# Patient Record
Sex: Female | Born: 1935 | Race: White | Hispanic: No | Smoking: Never smoker
Health system: Southern US, Community
[De-identification: ages and names within clinical notes are randomized; demographics above are authoritative.]

## PROBLEM LIST (undated history)

## (undated) DIAGNOSIS — D649 Anemia, unspecified: Secondary | ICD-10-CM

## (undated) DIAGNOSIS — M1990 Unspecified osteoarthritis, unspecified site: Secondary | ICD-10-CM

## (undated) DIAGNOSIS — Z923 Personal history of irradiation: Secondary | ICD-10-CM

## (undated) DIAGNOSIS — E041 Nontoxic single thyroid nodule: Secondary | ICD-10-CM

## (undated) DIAGNOSIS — I890 Lymphedema, not elsewhere classified: Secondary | ICD-10-CM

## (undated) DIAGNOSIS — I471 Supraventricular tachycardia, unspecified: Secondary | ICD-10-CM

## (undated) DIAGNOSIS — S46219A Strain of muscle, fascia and tendon of other parts of biceps, unspecified arm, initial encounter: Secondary | ICD-10-CM

## (undated) DIAGNOSIS — N3281 Overactive bladder: Secondary | ICD-10-CM

## (undated) DIAGNOSIS — D131 Benign neoplasm of stomach: Secondary | ICD-10-CM

## (undated) DIAGNOSIS — F32A Depression, unspecified: Secondary | ICD-10-CM

## (undated) DIAGNOSIS — T8859XA Other complications of anesthesia, initial encounter: Secondary | ICD-10-CM

## (undated) DIAGNOSIS — F329 Major depressive disorder, single episode, unspecified: Secondary | ICD-10-CM

## (undated) DIAGNOSIS — M545 Low back pain, unspecified: Secondary | ICD-10-CM

## (undated) DIAGNOSIS — R748 Abnormal levels of other serum enzymes: Secondary | ICD-10-CM

## (undated) DIAGNOSIS — C50919 Malignant neoplasm of unspecified site of unspecified female breast: Secondary | ICD-10-CM

## (undated) DIAGNOSIS — J479 Bronchiectasis, uncomplicated: Secondary | ICD-10-CM

## (undated) DIAGNOSIS — E119 Type 2 diabetes mellitus without complications: Secondary | ICD-10-CM

## (undated) DIAGNOSIS — C3490 Malignant neoplasm of unspecified part of unspecified bronchus or lung: Secondary | ICD-10-CM

## (undated) DIAGNOSIS — R05 Cough: Secondary | ICD-10-CM

## (undated) DIAGNOSIS — T4145XA Adverse effect of unspecified anesthetic, initial encounter: Secondary | ICD-10-CM

## (undated) DIAGNOSIS — G4700 Insomnia, unspecified: Secondary | ICD-10-CM

## (undated) DIAGNOSIS — M8580 Other specified disorders of bone density and structure, unspecified site: Secondary | ICD-10-CM

## (undated) DIAGNOSIS — K5790 Diverticulosis of intestine, part unspecified, without perforation or abscess without bleeding: Secondary | ICD-10-CM

## (undated) DIAGNOSIS — M810 Age-related osteoporosis without current pathological fracture: Secondary | ICD-10-CM

## (undated) DIAGNOSIS — I00 Rheumatic fever without heart involvement: Secondary | ICD-10-CM

## (undated) DIAGNOSIS — E785 Hyperlipidemia, unspecified: Secondary | ICD-10-CM

## (undated) DIAGNOSIS — H409 Unspecified glaucoma: Secondary | ICD-10-CM

## (undated) DIAGNOSIS — K219 Gastro-esophageal reflux disease without esophagitis: Secondary | ICD-10-CM

## (undated) DIAGNOSIS — F419 Anxiety disorder, unspecified: Secondary | ICD-10-CM

## (undated) DIAGNOSIS — K449 Diaphragmatic hernia without obstruction or gangrene: Secondary | ICD-10-CM

## (undated) DIAGNOSIS — K7581 Nonalcoholic steatohepatitis (NASH): Secondary | ICD-10-CM

## (undated) DIAGNOSIS — J189 Pneumonia, unspecified organism: Secondary | ICD-10-CM

## (undated) HISTORY — DX: Abnormal levels of other serum enzymes: R74.8

## (undated) HISTORY — DX: Unspecified glaucoma: H40.9

## (undated) HISTORY — DX: Personal history of irradiation: Z92.3

## (undated) HISTORY — DX: Nonalcoholic steatohepatitis (NASH): K75.81

## (undated) HISTORY — DX: Strain of muscle, fascia and tendon of other parts of biceps, unspecified arm, initial encounter: S46.219A

## (undated) HISTORY — DX: Diaphragmatic hernia without obstruction or gangrene: K44.9

## (undated) HISTORY — DX: Supraventricular tachycardia: I47.1

## (undated) HISTORY — DX: Gastro-esophageal reflux disease without esophagitis: K21.9

## (undated) HISTORY — PX: COLONOSCOPY: SHX174

## (undated) HISTORY — DX: Malignant neoplasm of unspecified part of unspecified bronchus or lung: C34.90

## (undated) HISTORY — DX: Lymphedema, not elsewhere classified: I89.0

## (undated) HISTORY — DX: Nontoxic single thyroid nodule: E04.1

## (undated) HISTORY — DX: Low back pain, unspecified: M54.50

## (undated) HISTORY — DX: Anemia, unspecified: D64.9

## (undated) HISTORY — DX: Supraventricular tachycardia, unspecified: I47.10

## (undated) HISTORY — PX: TOTAL HIP ARTHROPLASTY: SHX124

## (undated) HISTORY — DX: Rheumatic fever without heart involvement: I00

## (undated) HISTORY — PX: SKIN CANCER EXCISION: SHX779

## (undated) HISTORY — DX: Depression, unspecified: F32.A

## (undated) HISTORY — DX: Hyperlipidemia, unspecified: E78.5

## (undated) HISTORY — DX: Diverticulosis of intestine, part unspecified, without perforation or abscess without bleeding: K57.90

## (undated) HISTORY — DX: Unspecified osteoarthritis, unspecified site: M19.90

## (undated) HISTORY — DX: Bronchiectasis, uncomplicated: J47.9

## (undated) HISTORY — DX: Major depressive disorder, single episode, unspecified: F32.9

## (undated) HISTORY — DX: Low back pain: M54.5

## (undated) HISTORY — DX: Anxiety disorder, unspecified: F41.9

## (undated) HISTORY — DX: Age-related osteoporosis without current pathological fracture: M81.0

## (undated) HISTORY — DX: Type 2 diabetes mellitus without complications: E11.9

## (undated) HISTORY — DX: Overactive bladder: N32.81

## (undated) HISTORY — PX: BREAST BIOPSY: SHX20

## (undated) HISTORY — DX: Cough: R05

## (undated) HISTORY — DX: Malignant neoplasm of unspecified site of unspecified female breast: C50.919

## (undated) HISTORY — DX: Benign neoplasm of stomach: D13.1

## (undated) HISTORY — DX: Other specified disorders of bone density and structure, unspecified site: M85.80

## (undated) HISTORY — DX: Pneumonia, unspecified organism: J18.9

---
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
## 1962-12-17 HISTORY — PX: APPENDECTOMY: SHX54

## 1962-12-17 HISTORY — PX: ABDOMINAL HYSTERECTOMY: SHX81

## 1963-12-18 HISTORY — PX: CHOLECYSTECTOMY: SHX55

## 1998-12-28 ENCOUNTER — Ambulatory Visit (HOSPITAL_COMMUNITY): Admission: RE | Admit: 1998-12-28 | Discharge: 1998-12-28 | Payer: Self-pay | Admitting: Obstetrics & Gynecology

## 1999-12-18 DIAGNOSIS — C50919 Malignant neoplasm of unspecified site of unspecified female breast: Secondary | ICD-10-CM

## 1999-12-18 HISTORY — PX: BREAST LUMPECTOMY: SHX2

## 1999-12-18 HISTORY — DX: Malignant neoplasm of unspecified site of unspecified female breast: C50.919

## 2000-03-25 ENCOUNTER — Other Ambulatory Visit: Admission: RE | Admit: 2000-03-25 | Discharge: 2000-03-25 | Payer: Self-pay | Admitting: Radiology

## 2000-05-07 ENCOUNTER — Encounter: Admission: RE | Admit: 2000-05-07 | Discharge: 2000-08-05 | Payer: Self-pay | Admitting: Radiation Oncology

## 2000-06-21 ENCOUNTER — Encounter: Admission: RE | Admit: 2000-06-21 | Discharge: 2000-07-02 | Payer: Self-pay | Admitting: Radiation Oncology

## 2001-03-10 ENCOUNTER — Encounter: Admission: RE | Admit: 2001-03-10 | Discharge: 2001-05-07 | Payer: Self-pay | Admitting: Oncology

## 2001-07-29 ENCOUNTER — Other Ambulatory Visit: Admission: RE | Admit: 2001-07-29 | Discharge: 2001-07-29 | Payer: Self-pay | Admitting: Internal Medicine

## 2001-08-25 ENCOUNTER — Encounter: Admission: RE | Admit: 2001-08-25 | Discharge: 2001-08-25 | Payer: Self-pay | Admitting: Oncology

## 2001-08-25 ENCOUNTER — Encounter (HOSPITAL_COMMUNITY): Admission: RE | Admit: 2001-08-25 | Discharge: 2001-09-24 | Payer: Self-pay | Admitting: Oncology

## 2001-12-17 HISTORY — PX: CATARACT EXTRACTION: SUR2

## 2001-12-25 ENCOUNTER — Other Ambulatory Visit: Admission: RE | Admit: 2001-12-25 | Discharge: 2001-12-25 | Payer: Self-pay | Admitting: Internal Medicine

## 2001-12-30 ENCOUNTER — Encounter: Payer: Self-pay | Admitting: Internal Medicine

## 2001-12-30 ENCOUNTER — Ambulatory Visit (HOSPITAL_COMMUNITY): Admission: RE | Admit: 2001-12-30 | Discharge: 2001-12-30 | Payer: Self-pay | Admitting: Internal Medicine

## 2002-01-22 ENCOUNTER — Other Ambulatory Visit: Admission: RE | Admit: 2002-01-22 | Discharge: 2002-01-22 | Payer: Self-pay | Admitting: Internal Medicine

## 2002-02-23 ENCOUNTER — Encounter (HOSPITAL_COMMUNITY): Admission: RE | Admit: 2002-02-23 | Discharge: 2002-03-25 | Payer: Self-pay | Admitting: Oncology

## 2002-02-23 ENCOUNTER — Encounter: Admission: RE | Admit: 2002-02-23 | Discharge: 2002-02-23 | Payer: Self-pay | Admitting: Oncology

## 2002-08-26 ENCOUNTER — Encounter: Admission: RE | Admit: 2002-08-26 | Discharge: 2002-08-26 | Payer: Self-pay | Admitting: Oncology

## 2002-08-26 ENCOUNTER — Encounter (HOSPITAL_COMMUNITY): Admission: RE | Admit: 2002-08-26 | Discharge: 2002-09-25 | Payer: Self-pay | Admitting: Oncology

## 2002-12-17 ENCOUNTER — Encounter (INDEPENDENT_AMBULATORY_CARE_PROVIDER_SITE_OTHER): Payer: Self-pay | Admitting: Internal Medicine

## 2002-12-17 LAB — CONVERTED CEMR LAB: Pap Smear: NORMAL

## 2002-12-31 ENCOUNTER — Ambulatory Visit (HOSPITAL_COMMUNITY): Admission: RE | Admit: 2002-12-31 | Discharge: 2002-12-31 | Payer: Self-pay | Admitting: Internal Medicine

## 2002-12-31 ENCOUNTER — Encounter: Payer: Self-pay | Admitting: Internal Medicine

## 2003-02-24 ENCOUNTER — Encounter (HOSPITAL_COMMUNITY): Admission: RE | Admit: 2003-02-24 | Discharge: 2003-03-26 | Payer: Self-pay | Admitting: Oncology

## 2003-02-24 ENCOUNTER — Encounter: Admission: RE | Admit: 2003-02-24 | Discharge: 2003-02-24 | Payer: Self-pay | Admitting: Oncology

## 2003-03-11 ENCOUNTER — Encounter (HOSPITAL_COMMUNITY): Admission: RE | Admit: 2003-03-11 | Discharge: 2003-04-10 | Payer: Self-pay | Admitting: Rheumatology

## 2003-03-11 ENCOUNTER — Encounter: Payer: Self-pay | Admitting: Rheumatology

## 2003-09-01 ENCOUNTER — Encounter (HOSPITAL_COMMUNITY): Admission: RE | Admit: 2003-09-01 | Discharge: 2003-09-16 | Payer: Self-pay | Admitting: Oncology

## 2003-09-01 ENCOUNTER — Encounter: Admission: RE | Admit: 2003-09-01 | Discharge: 2003-09-01 | Payer: Self-pay | Admitting: Oncology

## 2003-11-03 ENCOUNTER — Encounter (HOSPITAL_COMMUNITY): Admission: RE | Admit: 2003-11-03 | Discharge: 2003-12-03 | Payer: Self-pay | Admitting: Oncology

## 2003-11-03 ENCOUNTER — Encounter: Admission: RE | Admit: 2003-11-03 | Discharge: 2003-11-03 | Payer: Self-pay | Admitting: Oncology

## 2003-11-25 ENCOUNTER — Encounter: Payer: Self-pay | Admitting: Orthopedic Surgery

## 2004-02-23 ENCOUNTER — Ambulatory Visit (HOSPITAL_COMMUNITY): Admission: RE | Admit: 2004-02-23 | Discharge: 2004-02-23 | Payer: Self-pay | Admitting: Internal Medicine

## 2004-03-01 ENCOUNTER — Encounter: Admission: RE | Admit: 2004-03-01 | Discharge: 2004-03-01 | Payer: Self-pay | Admitting: Oncology

## 2004-03-01 ENCOUNTER — Encounter (HOSPITAL_COMMUNITY): Admission: RE | Admit: 2004-03-01 | Discharge: 2004-03-31 | Payer: Self-pay | Admitting: Oncology

## 2004-03-06 ENCOUNTER — Encounter: Admission: RE | Admit: 2004-03-06 | Discharge: 2004-03-06 | Payer: Self-pay | Admitting: Oncology

## 2004-05-12 ENCOUNTER — Encounter: Admission: RE | Admit: 2004-05-12 | Discharge: 2004-05-12 | Payer: Self-pay | Admitting: Family Medicine

## 2004-06-22 ENCOUNTER — Ambulatory Visit (HOSPITAL_COMMUNITY): Admission: RE | Admit: 2004-06-22 | Discharge: 2004-06-22 | Payer: Self-pay | Admitting: Family Medicine

## 2004-06-30 ENCOUNTER — Ambulatory Visit (HOSPITAL_COMMUNITY): Admission: RE | Admit: 2004-06-30 | Discharge: 2004-06-30 | Payer: Self-pay | Admitting: Internal Medicine

## 2004-07-20 ENCOUNTER — Inpatient Hospital Stay (HOSPITAL_COMMUNITY): Admission: RE | Admit: 2004-07-20 | Discharge: 2004-07-24 | Payer: Self-pay | Admitting: Orthopaedic Surgery

## 2004-07-24 ENCOUNTER — Ambulatory Visit (HOSPITAL_COMMUNITY)
Admission: RE | Admit: 2004-07-24 | Discharge: 2004-07-24 | Payer: Self-pay | Admitting: Physical Medicine & Rehabilitation

## 2004-07-24 ENCOUNTER — Inpatient Hospital Stay
Admission: RE | Admit: 2004-07-24 | Discharge: 2004-08-03 | Payer: Self-pay | Admitting: Physical Medicine & Rehabilitation

## 2004-08-30 ENCOUNTER — Encounter (HOSPITAL_COMMUNITY): Admission: RE | Admit: 2004-08-30 | Discharge: 2004-09-15 | Payer: Self-pay | Admitting: Oncology

## 2004-08-30 ENCOUNTER — Encounter: Admission: RE | Admit: 2004-08-30 | Discharge: 2004-09-15 | Payer: Self-pay | Admitting: Oncology

## 2005-02-28 ENCOUNTER — Encounter: Admission: RE | Admit: 2005-02-28 | Discharge: 2005-02-28 | Payer: Self-pay | Admitting: Oncology

## 2005-02-28 ENCOUNTER — Ambulatory Visit (HOSPITAL_COMMUNITY): Payer: Self-pay | Admitting: Oncology

## 2005-02-28 ENCOUNTER — Encounter (HOSPITAL_COMMUNITY): Admission: RE | Admit: 2005-02-28 | Discharge: 2005-03-30 | Payer: Self-pay | Admitting: Oncology

## 2005-04-03 ENCOUNTER — Ambulatory Visit (HOSPITAL_COMMUNITY): Admission: RE | Admit: 2005-04-03 | Discharge: 2005-04-03 | Payer: Self-pay | Admitting: Internal Medicine

## 2005-08-16 ENCOUNTER — Ambulatory Visit: Payer: Self-pay | Admitting: Orthopedic Surgery

## 2005-09-21 ENCOUNTER — Ambulatory Visit: Payer: Self-pay | Admitting: Family Medicine

## 2005-09-28 ENCOUNTER — Ambulatory Visit: Payer: Self-pay | Admitting: Family Medicine

## 2005-10-02 ENCOUNTER — Ambulatory Visit (HOSPITAL_COMMUNITY): Admission: RE | Admit: 2005-10-02 | Discharge: 2005-10-02 | Payer: Self-pay | Admitting: Family Medicine

## 2005-10-22 ENCOUNTER — Ambulatory Visit: Payer: Self-pay | Admitting: Family Medicine

## 2005-10-24 ENCOUNTER — Other Ambulatory Visit: Admission: RE | Admit: 2005-10-24 | Discharge: 2005-10-24 | Payer: Self-pay | Admitting: Dermatology

## 2005-10-30 ENCOUNTER — Encounter (INDEPENDENT_AMBULATORY_CARE_PROVIDER_SITE_OTHER): Payer: Self-pay | Admitting: Internal Medicine

## 2005-10-30 LAB — CONVERTED CEMR LAB: Microalb Creat Ratio: 6.6 mg/g

## 2005-10-31 ENCOUNTER — Ambulatory Visit: Payer: Self-pay | Admitting: Family Medicine

## 2005-11-21 ENCOUNTER — Ambulatory Visit: Payer: Self-pay | Admitting: Family Medicine

## 2005-11-28 ENCOUNTER — Encounter: Admission: RE | Admit: 2005-11-28 | Discharge: 2005-11-28 | Payer: Self-pay | Admitting: Oncology

## 2005-11-28 ENCOUNTER — Ambulatory Visit (HOSPITAL_COMMUNITY): Payer: Self-pay | Admitting: Oncology

## 2005-11-28 ENCOUNTER — Encounter (HOSPITAL_COMMUNITY): Admission: RE | Admit: 2005-11-28 | Discharge: 2005-11-28 | Payer: Self-pay | Admitting: Oncology

## 2005-11-29 ENCOUNTER — Encounter (INDEPENDENT_AMBULATORY_CARE_PROVIDER_SITE_OTHER): Payer: Self-pay | Admitting: Internal Medicine

## 2005-12-17 HISTORY — PX: REVISION TOTAL HIP ARTHROPLASTY: SHX766

## 2005-12-17 HISTORY — PX: CYSTOSCOPY: SUR368

## 2005-12-25 ENCOUNTER — Ambulatory Visit: Payer: Self-pay | Admitting: Internal Medicine

## 2005-12-25 ENCOUNTER — Encounter (INDEPENDENT_AMBULATORY_CARE_PROVIDER_SITE_OTHER): Payer: Self-pay | Admitting: Internal Medicine

## 2005-12-31 ENCOUNTER — Ambulatory Visit: Payer: Self-pay | Admitting: Internal Medicine

## 2006-01-01 ENCOUNTER — Encounter (INDEPENDENT_AMBULATORY_CARE_PROVIDER_SITE_OTHER): Payer: Self-pay | Admitting: Internal Medicine

## 2006-01-01 ENCOUNTER — Ambulatory Visit: Payer: Self-pay | Admitting: Internal Medicine

## 2006-01-01 ENCOUNTER — Ambulatory Visit (HOSPITAL_COMMUNITY): Admission: RE | Admit: 2006-01-01 | Discharge: 2006-01-01 | Payer: Self-pay | Admitting: Internal Medicine

## 2006-01-29 ENCOUNTER — Ambulatory Visit: Payer: Self-pay | Admitting: Family Medicine

## 2006-01-31 ENCOUNTER — Ambulatory Visit (HOSPITAL_COMMUNITY): Admission: RE | Admit: 2006-01-31 | Discharge: 2006-01-31 | Payer: Self-pay | Admitting: Family Medicine

## 2006-02-20 ENCOUNTER — Encounter (INDEPENDENT_AMBULATORY_CARE_PROVIDER_SITE_OTHER): Payer: Self-pay | Admitting: Internal Medicine

## 2006-02-27 ENCOUNTER — Ambulatory Visit: Payer: Self-pay | Admitting: Internal Medicine

## 2006-02-27 LAB — CONVERTED CEMR LAB: Hgb A1c MFr Bld: 5.6 %

## 2006-03-06 ENCOUNTER — Ambulatory Visit: Payer: Self-pay | Admitting: Orthopedic Surgery

## 2006-04-23 ENCOUNTER — Inpatient Hospital Stay (HOSPITAL_COMMUNITY): Admission: RE | Admit: 2006-04-23 | Discharge: 2006-04-27 | Payer: Self-pay | Admitting: Orthopedic Surgery

## 2006-04-23 ENCOUNTER — Encounter (INDEPENDENT_AMBULATORY_CARE_PROVIDER_SITE_OTHER): Payer: Self-pay | Admitting: *Deleted

## 2006-04-23 ENCOUNTER — Ambulatory Visit: Payer: Self-pay | Admitting: Orthopedic Surgery

## 2006-04-24 ENCOUNTER — Ambulatory Visit: Payer: Self-pay | Admitting: Oncology

## 2006-05-03 ENCOUNTER — Ambulatory Visit: Payer: Self-pay | Admitting: Orthopedic Surgery

## 2006-05-04 ENCOUNTER — Ambulatory Visit: Payer: Self-pay | Admitting: *Deleted

## 2006-05-04 ENCOUNTER — Inpatient Hospital Stay (HOSPITAL_COMMUNITY): Admission: EM | Admit: 2006-05-04 | Discharge: 2006-05-07 | Payer: Self-pay | Admitting: Emergency Medicine

## 2006-05-04 ENCOUNTER — Encounter (INDEPENDENT_AMBULATORY_CARE_PROVIDER_SITE_OTHER): Payer: Self-pay | Admitting: Internal Medicine

## 2006-05-04 ENCOUNTER — Ambulatory Visit: Payer: Self-pay | Admitting: Orthopedic Surgery

## 2006-05-05 ENCOUNTER — Encounter: Payer: Self-pay | Admitting: Orthopedic Surgery

## 2006-05-15 ENCOUNTER — Ambulatory Visit: Payer: Self-pay | Admitting: Internal Medicine

## 2006-05-20 ENCOUNTER — Ambulatory Visit: Payer: Self-pay | Admitting: Orthopedic Surgery

## 2006-05-21 ENCOUNTER — Encounter (INDEPENDENT_AMBULATORY_CARE_PROVIDER_SITE_OTHER): Payer: Self-pay | Admitting: Internal Medicine

## 2006-05-29 ENCOUNTER — Ambulatory Visit: Payer: Self-pay | Admitting: Internal Medicine

## 2006-06-17 ENCOUNTER — Ambulatory Visit: Payer: Self-pay | Admitting: Orthopedic Surgery

## 2006-07-22 ENCOUNTER — Ambulatory Visit: Payer: Self-pay | Admitting: Orthopedic Surgery

## 2006-07-23 ENCOUNTER — Encounter (HOSPITAL_COMMUNITY): Admission: RE | Admit: 2006-07-23 | Discharge: 2006-08-22 | Payer: Self-pay | Admitting: Orthopedic Surgery

## 2006-07-23 ENCOUNTER — Encounter (INDEPENDENT_AMBULATORY_CARE_PROVIDER_SITE_OTHER): Payer: Self-pay | Admitting: Internal Medicine

## 2006-08-09 ENCOUNTER — Ambulatory Visit (HOSPITAL_COMMUNITY): Payer: Self-pay | Admitting: Oncology

## 2006-08-09 ENCOUNTER — Encounter (HOSPITAL_COMMUNITY): Admission: RE | Admit: 2006-08-09 | Discharge: 2006-09-08 | Payer: Self-pay | Admitting: Oncology

## 2006-08-09 ENCOUNTER — Encounter: Admission: RE | Admit: 2006-08-09 | Discharge: 2006-08-09 | Payer: Self-pay | Admitting: Oncology

## 2006-09-17 ENCOUNTER — Encounter: Admission: RE | Admit: 2006-09-17 | Discharge: 2006-09-17 | Payer: Self-pay | Admitting: Oncology

## 2006-09-17 ENCOUNTER — Encounter (HOSPITAL_COMMUNITY): Admission: RE | Admit: 2006-09-17 | Discharge: 2006-10-17 | Payer: Self-pay | Admitting: Oncology

## 2006-09-30 ENCOUNTER — Ambulatory Visit: Payer: Self-pay | Admitting: Orthopedic Surgery

## 2006-11-27 ENCOUNTER — Ambulatory Visit (HOSPITAL_COMMUNITY): Payer: Self-pay | Admitting: Oncology

## 2006-11-27 ENCOUNTER — Encounter (HOSPITAL_COMMUNITY): Admission: RE | Admit: 2006-11-27 | Discharge: 2006-12-16 | Payer: Self-pay | Admitting: Oncology

## 2006-11-27 ENCOUNTER — Encounter (INDEPENDENT_AMBULATORY_CARE_PROVIDER_SITE_OTHER): Payer: Self-pay | Admitting: Internal Medicine

## 2006-11-27 LAB — CONVERTED CEMR LAB
ALT: 35 units/L
CO2: 27 meq/L
Creatinine, Ser: 1.4 mg/dL
Total Bilirubin: 0.5 mg/dL

## 2006-12-17 HISTORY — PX: LIVER BIOPSY: SHX301

## 2006-12-30 ENCOUNTER — Ambulatory Visit: Payer: Self-pay | Admitting: Orthopedic Surgery

## 2007-01-08 ENCOUNTER — Encounter (HOSPITAL_COMMUNITY): Admission: RE | Admit: 2007-01-08 | Discharge: 2007-02-07 | Payer: Self-pay | Admitting: Oncology

## 2007-01-08 LAB — CONVERTED CEMR LAB
BUN: 22 mg/dL
Creatinine, Ser: 1.22 mg/dL
GFR calc Af Amer: 51 mL/min
GFR calc non Af Amer: 42 mL/min

## 2007-01-14 ENCOUNTER — Ambulatory Visit (HOSPITAL_COMMUNITY): Payer: Self-pay | Admitting: Oncology

## 2007-01-28 ENCOUNTER — Encounter: Payer: Self-pay | Admitting: Internal Medicine

## 2007-01-28 DIAGNOSIS — M1990 Unspecified osteoarthritis, unspecified site: Secondary | ICD-10-CM | POA: Insufficient documentation

## 2007-01-28 DIAGNOSIS — M810 Age-related osteoporosis without current pathological fracture: Secondary | ICD-10-CM | POA: Insufficient documentation

## 2007-01-28 DIAGNOSIS — J309 Allergic rhinitis, unspecified: Secondary | ICD-10-CM | POA: Insufficient documentation

## 2007-01-28 DIAGNOSIS — M545 Low back pain, unspecified: Secondary | ICD-10-CM | POA: Insufficient documentation

## 2007-01-28 DIAGNOSIS — K219 Gastro-esophageal reflux disease without esophagitis: Secondary | ICD-10-CM | POA: Insufficient documentation

## 2007-01-28 DIAGNOSIS — D500 Iron deficiency anemia secondary to blood loss (chronic): Secondary | ICD-10-CM | POA: Insufficient documentation

## 2007-01-28 DIAGNOSIS — N318 Other neuromuscular dysfunction of bladder: Secondary | ICD-10-CM | POA: Insufficient documentation

## 2007-01-28 DIAGNOSIS — I890 Lymphedema, not elsewhere classified: Secondary | ICD-10-CM | POA: Insufficient documentation

## 2007-01-28 DIAGNOSIS — E1165 Type 2 diabetes mellitus with hyperglycemia: Secondary | ICD-10-CM | POA: Insufficient documentation

## 2007-01-28 DIAGNOSIS — C50919 Malignant neoplasm of unspecified site of unspecified female breast: Secondary | ICD-10-CM | POA: Insufficient documentation

## 2007-01-28 DIAGNOSIS — E785 Hyperlipidemia, unspecified: Secondary | ICD-10-CM | POA: Insufficient documentation

## 2007-01-29 ENCOUNTER — Ambulatory Visit: Payer: Self-pay | Admitting: Internal Medicine

## 2007-01-29 LAB — CONVERTED CEMR LAB
ALT: 17 units/L (ref 0–35)
Albumin: 4 g/dL (ref 3.5–5.2)
CO2: 25 meq/L (ref 19–32)
Cholesterol: 223 mg/dL — ABNORMAL HIGH (ref 0–200)
Glucose, Bld: 86 mg/dL (ref 70–99)
HCT: 37.1 % (ref 36.0–46.0)
Hgb A1c MFr Bld: 5.4 %
LDL Cholesterol: 133 mg/dL — ABNORMAL HIGH (ref 0–99)
Lymphocytes Relative: 18 % (ref 12–46)
Lymphs Abs: 1 10*3/uL (ref 0.7–3.3)
Neutro Abs: 3.7 10*3/uL (ref 1.7–7.7)
Neutrophils Relative %: 68 % (ref 43–77)
Platelets: 255 10*3/uL (ref 150–400)
Potassium: 4.6 meq/L (ref 3.5–5.3)
Sodium: 138 meq/L (ref 135–145)
Total Protein: 7.3 g/dL (ref 6.0–8.3)
Triglycerides: 127 mg/dL (ref <150)
WBC: 5.4 10*3/uL (ref 4.0–10.5)

## 2007-04-29 ENCOUNTER — Encounter (INDEPENDENT_AMBULATORY_CARE_PROVIDER_SITE_OTHER): Payer: Self-pay | Admitting: Internal Medicine

## 2007-05-01 ENCOUNTER — Ambulatory Visit: Payer: Self-pay | Admitting: Internal Medicine

## 2007-05-01 DIAGNOSIS — R5383 Other fatigue: Secondary | ICD-10-CM | POA: Insufficient documentation

## 2007-05-02 ENCOUNTER — Encounter (INDEPENDENT_AMBULATORY_CARE_PROVIDER_SITE_OTHER): Payer: Self-pay | Admitting: Internal Medicine

## 2007-05-05 LAB — CONVERTED CEMR LAB
Ferritin: 434 ng/mL — ABNORMAL HIGH (ref 10–291)
Iron: 91 ug/dL (ref 42–145)
Saturation Ratios: 40 % (ref 20–55)
TIBC: 229 ug/dL — ABNORMAL LOW (ref 250–470)
UIBC: 138 ug/dL
Vitamin B-12: 871 pg/mL (ref 211–911)

## 2007-05-06 ENCOUNTER — Encounter (INDEPENDENT_AMBULATORY_CARE_PROVIDER_SITE_OTHER): Payer: Self-pay | Admitting: Family Medicine

## 2007-05-06 DIAGNOSIS — R198 Other specified symptoms and signs involving the digestive system and abdomen: Secondary | ICD-10-CM | POA: Insufficient documentation

## 2007-05-06 DIAGNOSIS — R0602 Shortness of breath: Secondary | ICD-10-CM | POA: Insufficient documentation

## 2007-05-09 ENCOUNTER — Ambulatory Visit (HOSPITAL_COMMUNITY): Admission: RE | Admit: 2007-05-09 | Discharge: 2007-05-09 | Payer: Self-pay | Admitting: Family Medicine

## 2007-05-14 ENCOUNTER — Encounter (INDEPENDENT_AMBULATORY_CARE_PROVIDER_SITE_OTHER): Payer: Self-pay | Admitting: Internal Medicine

## 2007-05-14 DIAGNOSIS — E278 Other specified disorders of adrenal gland: Secondary | ICD-10-CM | POA: Insufficient documentation

## 2007-05-17 ENCOUNTER — Encounter (INDEPENDENT_AMBULATORY_CARE_PROVIDER_SITE_OTHER): Payer: Self-pay | Admitting: Internal Medicine

## 2007-05-28 ENCOUNTER — Ambulatory Visit: Payer: Self-pay | Admitting: Internal Medicine

## 2007-05-28 DIAGNOSIS — M6281 Muscle weakness (generalized): Secondary | ICD-10-CM | POA: Insufficient documentation

## 2007-06-02 LAB — CONVERTED CEMR LAB
Metaneph Total, Ur: 457 ug/24hr (ref 95–475)
Metanephrines, Ur: 88 (ref 19–140)
Norepinephrine 24 Hr Urine: 36 mcg/24hr (ref <80)
Normetanephrine, 24H Ur: 369 — ABNORMAL HIGH (ref 52–310)
VMA, 24H Ur Adult: 5.6 mg/24hr (ref 1.8–6.7)
Volume, Urine-CORTUR: 2000 mL

## 2007-06-03 ENCOUNTER — Emergency Department (HOSPITAL_COMMUNITY): Admission: EM | Admit: 2007-06-03 | Discharge: 2007-06-03 | Payer: Self-pay | Admitting: Emergency Medicine

## 2007-06-04 ENCOUNTER — Ambulatory Visit: Payer: Self-pay | Admitting: Internal Medicine

## 2007-06-09 ENCOUNTER — Ambulatory Visit (HOSPITAL_COMMUNITY): Admission: RE | Admit: 2007-06-09 | Discharge: 2007-06-09 | Payer: Self-pay | Admitting: Internal Medicine

## 2007-06-10 ENCOUNTER — Ambulatory Visit: Payer: Self-pay | Admitting: Cardiology

## 2007-06-13 ENCOUNTER — Encounter (INDEPENDENT_AMBULATORY_CARE_PROVIDER_SITE_OTHER): Payer: Self-pay | Admitting: Internal Medicine

## 2007-06-17 DIAGNOSIS — K219 Gastro-esophageal reflux disease without esophagitis: Secondary | ICD-10-CM

## 2007-06-17 HISTORY — DX: Gastro-esophageal reflux disease without esophagitis: K21.9

## 2007-06-18 ENCOUNTER — Telehealth (INDEPENDENT_AMBULATORY_CARE_PROVIDER_SITE_OTHER): Payer: Self-pay | Admitting: Internal Medicine

## 2007-06-25 ENCOUNTER — Ambulatory Visit: Payer: Self-pay | Admitting: Internal Medicine

## 2007-06-27 ENCOUNTER — Telehealth (INDEPENDENT_AMBULATORY_CARE_PROVIDER_SITE_OTHER): Payer: Self-pay | Admitting: *Deleted

## 2007-06-30 ENCOUNTER — Ambulatory Visit: Payer: Self-pay | Admitting: Internal Medicine

## 2007-06-30 ENCOUNTER — Encounter (INDEPENDENT_AMBULATORY_CARE_PROVIDER_SITE_OTHER): Payer: Self-pay | Admitting: Internal Medicine

## 2007-06-30 ENCOUNTER — Encounter: Payer: Self-pay | Admitting: Internal Medicine

## 2007-06-30 ENCOUNTER — Ambulatory Visit (HOSPITAL_COMMUNITY): Admission: RE | Admit: 2007-06-30 | Discharge: 2007-06-30 | Payer: Self-pay | Admitting: Internal Medicine

## 2007-07-02 ENCOUNTER — Encounter (HOSPITAL_COMMUNITY): Admission: RE | Admit: 2007-07-02 | Discharge: 2007-08-01 | Payer: Self-pay | Admitting: Internal Medicine

## 2007-07-06 ENCOUNTER — Ambulatory Visit: Payer: Self-pay | Admitting: Cardiology

## 2007-07-06 ENCOUNTER — Observation Stay (HOSPITAL_COMMUNITY): Admission: EM | Admit: 2007-07-06 | Discharge: 2007-07-10 | Payer: Self-pay | Admitting: Emergency Medicine

## 2007-07-10 ENCOUNTER — Telehealth (INDEPENDENT_AMBULATORY_CARE_PROVIDER_SITE_OTHER): Payer: Self-pay | Admitting: Internal Medicine

## 2007-07-10 ENCOUNTER — Encounter (INDEPENDENT_AMBULATORY_CARE_PROVIDER_SITE_OTHER): Payer: Self-pay | Admitting: Internal Medicine

## 2007-07-11 ENCOUNTER — Encounter (INDEPENDENT_AMBULATORY_CARE_PROVIDER_SITE_OTHER): Payer: Self-pay | Admitting: Internal Medicine

## 2007-07-16 ENCOUNTER — Telehealth (INDEPENDENT_AMBULATORY_CARE_PROVIDER_SITE_OTHER): Payer: Self-pay | Admitting: *Deleted

## 2007-07-31 ENCOUNTER — Ambulatory Visit: Payer: Self-pay | Admitting: Internal Medicine

## 2007-07-31 DIAGNOSIS — F418 Other specified anxiety disorders: Secondary | ICD-10-CM | POA: Insufficient documentation

## 2007-07-31 LAB — CONVERTED CEMR LAB: Hgb A1c MFr Bld: 5.7 %

## 2007-08-03 ENCOUNTER — Encounter (INDEPENDENT_AMBULATORY_CARE_PROVIDER_SITE_OTHER): Payer: Self-pay | Admitting: Internal Medicine

## 2007-08-07 ENCOUNTER — Encounter (INDEPENDENT_AMBULATORY_CARE_PROVIDER_SITE_OTHER): Payer: Self-pay | Admitting: Internal Medicine

## 2007-08-08 ENCOUNTER — Ambulatory Visit: Payer: Self-pay | Admitting: Internal Medicine

## 2007-08-19 ENCOUNTER — Ambulatory Visit: Payer: Self-pay | Admitting: Internal Medicine

## 2007-08-19 ENCOUNTER — Encounter (INDEPENDENT_AMBULATORY_CARE_PROVIDER_SITE_OTHER): Payer: Self-pay | Admitting: Internal Medicine

## 2007-08-20 ENCOUNTER — Telehealth (INDEPENDENT_AMBULATORY_CARE_PROVIDER_SITE_OTHER): Payer: Self-pay | Admitting: *Deleted

## 2007-08-27 ENCOUNTER — Ambulatory Visit: Payer: Self-pay | Admitting: Internal Medicine

## 2007-08-27 DIAGNOSIS — R002 Palpitations: Secondary | ICD-10-CM | POA: Insufficient documentation

## 2007-08-29 ENCOUNTER — Encounter (INDEPENDENT_AMBULATORY_CARE_PROVIDER_SITE_OTHER): Payer: Self-pay | Admitting: Internal Medicine

## 2007-09-01 ENCOUNTER — Telehealth (INDEPENDENT_AMBULATORY_CARE_PROVIDER_SITE_OTHER): Payer: Self-pay | Admitting: Internal Medicine

## 2007-09-02 ENCOUNTER — Telehealth (INDEPENDENT_AMBULATORY_CARE_PROVIDER_SITE_OTHER): Payer: Self-pay | Admitting: *Deleted

## 2007-09-03 ENCOUNTER — Ambulatory Visit (HOSPITAL_COMMUNITY): Admission: RE | Admit: 2007-09-03 | Discharge: 2007-09-03 | Payer: Self-pay | Admitting: Internal Medicine

## 2007-09-03 ENCOUNTER — Ambulatory Visit: Payer: Self-pay | Admitting: Internal Medicine

## 2007-09-03 DIAGNOSIS — R222 Localized swelling, mass and lump, trunk: Secondary | ICD-10-CM | POA: Insufficient documentation

## 2007-09-04 ENCOUNTER — Telehealth (INDEPENDENT_AMBULATORY_CARE_PROVIDER_SITE_OTHER): Payer: Self-pay | Admitting: *Deleted

## 2007-09-04 ENCOUNTER — Encounter (INDEPENDENT_AMBULATORY_CARE_PROVIDER_SITE_OTHER): Payer: Self-pay | Admitting: Internal Medicine

## 2007-09-05 ENCOUNTER — Ambulatory Visit (HOSPITAL_COMMUNITY): Admission: RE | Admit: 2007-09-05 | Discharge: 2007-09-05 | Payer: Self-pay | Admitting: Internal Medicine

## 2007-09-05 LAB — CONVERTED CEMR LAB: Creatinine, Ser: 1.18 mg/dL (ref 0.40–1.20)

## 2007-09-12 ENCOUNTER — Ambulatory Visit (HOSPITAL_COMMUNITY): Payer: Self-pay | Admitting: Oncology

## 2007-09-12 ENCOUNTER — Encounter (HOSPITAL_COMMUNITY): Admission: RE | Admit: 2007-09-12 | Discharge: 2007-09-16 | Payer: Self-pay | Admitting: Oncology

## 2007-09-12 ENCOUNTER — Encounter (INDEPENDENT_AMBULATORY_CARE_PROVIDER_SITE_OTHER): Payer: Self-pay | Admitting: Internal Medicine

## 2007-09-12 LAB — CONVERTED CEMR LAB
AST: 74 units/L
Albumin: 2.8 g/dL
Alkaline Phosphatase: 129 units/L
Basophils Absolute: 0 10*3/uL
Basophils Relative: 1 %
CO2: 29 meq/L
Calcium: 7.9 mg/dL
Chloride: 102 meq/L
Creatinine, Ser: 1.2 mg/dL
GFR calc non Af Amer: 43 mL/min
Glucose, Bld: 75 mg/dL
Hemoglobin: 11.6 g/dL
Lymphocytes Relative: 32 %
MCHC: 34.4 g/dL
Neutro Abs: 2.4 10*3/uL
Neutrophils Relative %: 51 %
RBC: 3.59 M/uL
RDW: 16.7 %
Total Protein: 6.2 g/dL

## 2007-09-16 ENCOUNTER — Encounter (INDEPENDENT_AMBULATORY_CARE_PROVIDER_SITE_OTHER): Payer: Self-pay | Admitting: Internal Medicine

## 2007-09-16 DIAGNOSIS — R945 Abnormal results of liver function studies: Secondary | ICD-10-CM | POA: Insufficient documentation

## 2007-09-17 ENCOUNTER — Encounter (INDEPENDENT_AMBULATORY_CARE_PROVIDER_SITE_OTHER): Payer: Self-pay | Admitting: Internal Medicine

## 2007-09-17 ENCOUNTER — Telehealth (INDEPENDENT_AMBULATORY_CARE_PROVIDER_SITE_OTHER): Payer: Self-pay | Admitting: *Deleted

## 2007-09-18 ENCOUNTER — Encounter (INDEPENDENT_AMBULATORY_CARE_PROVIDER_SITE_OTHER): Payer: Self-pay | Admitting: Internal Medicine

## 2007-09-18 ENCOUNTER — Ambulatory Visit: Payer: Self-pay | Admitting: Internal Medicine

## 2007-09-26 ENCOUNTER — Encounter (HOSPITAL_COMMUNITY): Admission: RE | Admit: 2007-09-26 | Discharge: 2007-10-26 | Payer: Self-pay | Admitting: Oncology

## 2007-10-13 ENCOUNTER — Ambulatory Visit: Payer: Self-pay | Admitting: Internal Medicine

## 2007-10-13 ENCOUNTER — Encounter (HOSPITAL_COMMUNITY): Admission: RE | Admit: 2007-10-13 | Discharge: 2007-11-12 | Payer: Self-pay | Admitting: Internal Medicine

## 2007-10-14 ENCOUNTER — Encounter (INDEPENDENT_AMBULATORY_CARE_PROVIDER_SITE_OTHER): Payer: Self-pay | Admitting: Internal Medicine

## 2007-11-06 ENCOUNTER — Encounter (HOSPITAL_COMMUNITY): Admission: RE | Admit: 2007-11-06 | Discharge: 2007-12-06 | Payer: Self-pay | Admitting: Oncology

## 2007-11-19 ENCOUNTER — Encounter (INDEPENDENT_AMBULATORY_CARE_PROVIDER_SITE_OTHER): Payer: Self-pay | Admitting: Internal Medicine

## 2007-11-19 ENCOUNTER — Ambulatory Visit (HOSPITAL_COMMUNITY): Payer: Self-pay | Admitting: Oncology

## 2007-11-26 ENCOUNTER — Ambulatory Visit (HOSPITAL_COMMUNITY): Admission: RE | Admit: 2007-11-26 | Discharge: 2007-11-26 | Payer: Self-pay | Admitting: Oncology

## 2007-12-03 ENCOUNTER — Encounter (INDEPENDENT_AMBULATORY_CARE_PROVIDER_SITE_OTHER): Payer: Self-pay | Admitting: Interventional Radiology

## 2007-12-03 ENCOUNTER — Emergency Department (HOSPITAL_COMMUNITY): Admission: EM | Admit: 2007-12-03 | Discharge: 2007-12-04 | Payer: Self-pay | Admitting: Emergency Medicine

## 2007-12-03 ENCOUNTER — Ambulatory Visit (HOSPITAL_COMMUNITY): Admission: RE | Admit: 2007-12-03 | Discharge: 2007-12-03 | Payer: Self-pay | Admitting: Oncology

## 2007-12-05 ENCOUNTER — Ambulatory Visit (HOSPITAL_COMMUNITY): Admission: RE | Admit: 2007-12-05 | Discharge: 2007-12-05 | Payer: Self-pay

## 2007-12-05 ENCOUNTER — Telehealth (INDEPENDENT_AMBULATORY_CARE_PROVIDER_SITE_OTHER): Payer: Self-pay | Admitting: *Deleted

## 2007-12-05 ENCOUNTER — Ambulatory Visit: Payer: Self-pay | Admitting: Internal Medicine

## 2007-12-05 ENCOUNTER — Emergency Department (HOSPITAL_COMMUNITY): Admission: EM | Admit: 2007-12-05 | Discharge: 2007-12-05 | Payer: Self-pay | Admitting: Emergency Medicine

## 2007-12-05 DIAGNOSIS — R1084 Generalized abdominal pain: Secondary | ICD-10-CM | POA: Insufficient documentation

## 2007-12-05 LAB — CONVERTED CEMR LAB
ALT: 42 units/L — ABNORMAL HIGH (ref 0–35)
Albumin: 2.6 g/dL — ABNORMAL LOW (ref 3.5–5.2)
Basophils Absolute: 0 10*3/uL (ref 0.0–0.1)
CO2: 28 meq/L (ref 19–32)
Calcium: 8.4 mg/dL (ref 8.4–10.5)
Chloride: 93 meq/L — ABNORMAL LOW (ref 96–112)
Lymphocytes Relative: 5 % — ABNORMAL LOW (ref 12–46)
Lymphs Abs: 0.6 10*3/uL — ABNORMAL LOW (ref 0.7–4.0)
Neutro Abs: 9.3 10*3/uL — ABNORMAL HIGH (ref 1.7–7.7)
Neutrophils Relative %: 86 % — ABNORMAL HIGH (ref 43–77)
Platelets: 249 10*3/uL (ref 150–400)
Potassium: 4.1 meq/L (ref 3.5–5.3)
RDW: 15.5 % (ref 11.5–15.5)
Sodium: 128 meq/L — ABNORMAL LOW (ref 135–145)
Total Bilirubin: 1.7 mg/dL — ABNORMAL HIGH (ref 0.3–1.2)
Total Protein: 6.2 g/dL (ref 6.0–8.3)
WBC: 10.9 10*3/uL — ABNORMAL HIGH (ref 4.0–10.5)

## 2007-12-09 ENCOUNTER — Telehealth (INDEPENDENT_AMBULATORY_CARE_PROVIDER_SITE_OTHER): Payer: Self-pay | Admitting: Internal Medicine

## 2007-12-10 ENCOUNTER — Encounter (INDEPENDENT_AMBULATORY_CARE_PROVIDER_SITE_OTHER): Payer: Self-pay | Admitting: Internal Medicine

## 2007-12-10 ENCOUNTER — Encounter (HOSPITAL_COMMUNITY): Admission: RE | Admit: 2007-12-10 | Discharge: 2007-12-17 | Payer: Self-pay | Admitting: Oncology

## 2007-12-16 ENCOUNTER — Encounter (INDEPENDENT_AMBULATORY_CARE_PROVIDER_SITE_OTHER): Payer: Self-pay | Admitting: Internal Medicine

## 2007-12-17 ENCOUNTER — Encounter (INDEPENDENT_AMBULATORY_CARE_PROVIDER_SITE_OTHER): Payer: Self-pay | Admitting: Internal Medicine

## 2007-12-22 ENCOUNTER — Ambulatory Visit: Payer: Self-pay | Admitting: Internal Medicine

## 2007-12-22 DIAGNOSIS — E8809 Other disorders of plasma-protein metabolism, not elsewhere classified: Secondary | ICD-10-CM | POA: Insufficient documentation

## 2007-12-22 DIAGNOSIS — R609 Edema, unspecified: Secondary | ICD-10-CM | POA: Insufficient documentation

## 2008-01-06 ENCOUNTER — Ambulatory Visit: Payer: Self-pay | Admitting: Internal Medicine

## 2008-01-06 ENCOUNTER — Encounter (INDEPENDENT_AMBULATORY_CARE_PROVIDER_SITE_OTHER): Payer: Self-pay | Admitting: Internal Medicine

## 2008-02-03 ENCOUNTER — Ambulatory Visit: Payer: Self-pay | Admitting: Internal Medicine

## 2008-02-12 ENCOUNTER — Ambulatory Visit: Payer: Self-pay | Admitting: Internal Medicine

## 2008-02-17 ENCOUNTER — Ambulatory Visit (HOSPITAL_COMMUNITY): Admission: RE | Admit: 2008-02-17 | Discharge: 2008-02-17 | Payer: Self-pay | Admitting: Internal Medicine

## 2008-02-17 ENCOUNTER — Encounter (INDEPENDENT_AMBULATORY_CARE_PROVIDER_SITE_OTHER): Payer: Self-pay | Admitting: Internal Medicine

## 2008-03-08 ENCOUNTER — Encounter (INDEPENDENT_AMBULATORY_CARE_PROVIDER_SITE_OTHER): Payer: Self-pay | Admitting: Internal Medicine

## 2008-03-08 ENCOUNTER — Ambulatory Visit (HOSPITAL_COMMUNITY): Payer: Self-pay | Admitting: Oncology

## 2008-03-11 ENCOUNTER — Ambulatory Visit: Payer: Self-pay | Admitting: Internal Medicine

## 2008-03-12 ENCOUNTER — Encounter (INDEPENDENT_AMBULATORY_CARE_PROVIDER_SITE_OTHER): Payer: Self-pay | Admitting: Internal Medicine

## 2008-03-14 ENCOUNTER — Encounter (INDEPENDENT_AMBULATORY_CARE_PROVIDER_SITE_OTHER): Payer: Self-pay | Admitting: Internal Medicine

## 2008-03-15 ENCOUNTER — Ambulatory Visit (HOSPITAL_COMMUNITY): Admission: RE | Admit: 2008-03-15 | Discharge: 2008-03-15 | Payer: Self-pay | Admitting: Internal Medicine

## 2008-03-19 ENCOUNTER — Ambulatory Visit: Payer: Self-pay | Admitting: Cardiology

## 2008-04-23 ENCOUNTER — Ambulatory Visit (HOSPITAL_COMMUNITY): Payer: Self-pay | Admitting: Oncology

## 2008-04-23 ENCOUNTER — Encounter (HOSPITAL_COMMUNITY): Admission: RE | Admit: 2008-04-23 | Discharge: 2008-05-23 | Payer: Self-pay | Admitting: Oncology

## 2008-05-03 ENCOUNTER — Ambulatory Visit: Payer: Self-pay | Admitting: Orthopedic Surgery

## 2008-05-03 DIAGNOSIS — M169 Osteoarthritis of hip, unspecified: Secondary | ICD-10-CM | POA: Insufficient documentation

## 2008-05-03 DIAGNOSIS — M1610 Unilateral primary osteoarthritis, unspecified hip: Secondary | ICD-10-CM | POA: Insufficient documentation

## 2008-05-19 ENCOUNTER — Telehealth: Payer: Self-pay | Admitting: Orthopedic Surgery

## 2008-05-31 ENCOUNTER — Encounter (INDEPENDENT_AMBULATORY_CARE_PROVIDER_SITE_OTHER): Payer: Self-pay | Admitting: Internal Medicine

## 2008-06-07 ENCOUNTER — Ambulatory Visit: Payer: Self-pay | Admitting: Internal Medicine

## 2008-06-07 DIAGNOSIS — E871 Hypo-osmolality and hyponatremia: Secondary | ICD-10-CM | POA: Insufficient documentation

## 2008-06-07 LAB — CONVERTED CEMR LAB: Blood Glucose, Fingerstick: 113

## 2008-06-08 ENCOUNTER — Encounter (INDEPENDENT_AMBULATORY_CARE_PROVIDER_SITE_OTHER): Payer: Self-pay | Admitting: Internal Medicine

## 2008-06-08 LAB — CONVERTED CEMR LAB
ALT: 18 units/L (ref 0–35)
AST: 25 units/L (ref 0–37)
Basophils Absolute: 0 10*3/uL (ref 0.0–0.1)
Basophils Relative: 1 % (ref 0–1)
Chloride: 104 meq/L (ref 96–112)
Creatinine, Ser: 1.08 mg/dL (ref 0.40–1.20)
Eosinophils Relative: 1 % (ref 0–5)
Hemoglobin: 11.4 g/dL — ABNORMAL LOW (ref 12.0–15.0)
MCHC: 32 g/dL (ref 30.0–36.0)
Monocytes Absolute: 0.5 10*3/uL (ref 0.1–1.0)
Neutro Abs: 3.4 10*3/uL (ref 1.7–7.7)
Platelets: 211 10*3/uL (ref 150–400)
RDW: 13.9 % (ref 11.5–15.5)
Total Bilirubin: 0.4 mg/dL (ref 0.3–1.2)
Total CHOL/HDL Ratio: 3
VLDL: 31 mg/dL (ref 0–40)

## 2008-06-28 ENCOUNTER — Encounter: Payer: Self-pay | Admitting: Orthopedic Surgery

## 2008-08-12 ENCOUNTER — Ambulatory Visit: Payer: Self-pay | Admitting: Cardiology

## 2008-08-18 ENCOUNTER — Encounter (HOSPITAL_COMMUNITY): Admission: AD | Admit: 2008-08-18 | Discharge: 2008-09-13 | Payer: Self-pay | Admitting: Oncology

## 2008-08-18 ENCOUNTER — Ambulatory Visit (HOSPITAL_COMMUNITY): Admission: RE | Admit: 2008-08-18 | Discharge: 2008-08-18 | Payer: Self-pay | Admitting: Internal Medicine

## 2008-08-18 ENCOUNTER — Encounter (INDEPENDENT_AMBULATORY_CARE_PROVIDER_SITE_OTHER): Payer: Self-pay | Admitting: Internal Medicine

## 2008-08-26 ENCOUNTER — Ambulatory Visit (HOSPITAL_COMMUNITY): Admission: RE | Admit: 2008-08-26 | Discharge: 2008-08-26 | Payer: Self-pay | Admitting: Internal Medicine

## 2008-09-06 ENCOUNTER — Ambulatory Visit: Payer: Self-pay | Admitting: Internal Medicine

## 2008-09-06 DIAGNOSIS — E041 Nontoxic single thyroid nodule: Secondary | ICD-10-CM | POA: Insufficient documentation

## 2008-09-07 ENCOUNTER — Encounter (INDEPENDENT_AMBULATORY_CARE_PROVIDER_SITE_OTHER): Payer: Self-pay | Admitting: Internal Medicine

## 2008-09-08 LAB — CONVERTED CEMR LAB
AST: 22 units/L (ref 0–37)
Albumin: 3.9 g/dL (ref 3.5–5.2)
BUN: 18 mg/dL (ref 6–23)
Calcium: 9.2 mg/dL (ref 8.4–10.5)
Chloride: 103 meq/L (ref 96–112)
Glucose, Bld: 104 mg/dL — ABNORMAL HIGH (ref 70–99)
Lymphs Abs: 1.1 10*3/uL (ref 0.7–4.0)
Monocytes Relative: 11 % (ref 3–12)
Neutro Abs: 2.8 10*3/uL (ref 1.7–7.7)
Neutrophils Relative %: 62 % (ref 43–77)
Potassium: 4.5 meq/L (ref 3.5–5.3)
RBC: 3.89 M/uL (ref 3.87–5.11)
WBC: 4.5 10*3/uL (ref 4.0–10.5)

## 2008-09-10 ENCOUNTER — Ambulatory Visit: Payer: Self-pay | Admitting: Internal Medicine

## 2008-09-10 ENCOUNTER — Encounter (INDEPENDENT_AMBULATORY_CARE_PROVIDER_SITE_OTHER): Payer: Self-pay | Admitting: Internal Medicine

## 2008-09-21 ENCOUNTER — Ambulatory Visit: Payer: Self-pay | Admitting: Internal Medicine

## 2008-10-11 ENCOUNTER — Telehealth (INDEPENDENT_AMBULATORY_CARE_PROVIDER_SITE_OTHER): Payer: Self-pay | Admitting: *Deleted

## 2008-10-22 ENCOUNTER — Encounter (HOSPITAL_COMMUNITY): Admission: RE | Admit: 2008-10-22 | Discharge: 2008-11-21 | Payer: Self-pay | Admitting: Oncology

## 2008-10-22 ENCOUNTER — Ambulatory Visit (HOSPITAL_COMMUNITY): Payer: Self-pay | Admitting: Oncology

## 2008-11-16 HISTORY — PX: BIOPSY THYROID: PRO38

## 2008-11-23 ENCOUNTER — Telehealth (INDEPENDENT_AMBULATORY_CARE_PROVIDER_SITE_OTHER): Payer: Self-pay | Admitting: *Deleted

## 2008-11-29 ENCOUNTER — Ambulatory Visit (HOSPITAL_COMMUNITY): Admission: RE | Admit: 2008-11-29 | Discharge: 2008-11-29 | Payer: Self-pay | Admitting: Internal Medicine

## 2008-12-02 ENCOUNTER — Ambulatory Visit: Payer: Self-pay | Admitting: Internal Medicine

## 2008-12-03 ENCOUNTER — Telehealth (INDEPENDENT_AMBULATORY_CARE_PROVIDER_SITE_OTHER): Payer: Self-pay | Admitting: Internal Medicine

## 2008-12-13 ENCOUNTER — Encounter (INDEPENDENT_AMBULATORY_CARE_PROVIDER_SITE_OTHER): Payer: Self-pay | Admitting: Internal Medicine

## 2008-12-13 ENCOUNTER — Ambulatory Visit (HOSPITAL_COMMUNITY): Admission: RE | Admit: 2008-12-13 | Discharge: 2008-12-13 | Payer: Self-pay | Admitting: Internal Medicine

## 2008-12-13 ENCOUNTER — Encounter (INDEPENDENT_AMBULATORY_CARE_PROVIDER_SITE_OTHER): Payer: Self-pay | Admitting: Diagnostic Radiology

## 2009-03-07 ENCOUNTER — Ambulatory Visit (HOSPITAL_COMMUNITY): Payer: Self-pay | Admitting: Oncology

## 2009-03-07 ENCOUNTER — Encounter (INDEPENDENT_AMBULATORY_CARE_PROVIDER_SITE_OTHER): Payer: Self-pay | Admitting: Internal Medicine

## 2009-03-16 ENCOUNTER — Encounter: Payer: Self-pay | Admitting: Internal Medicine

## 2009-04-14 ENCOUNTER — Ambulatory Visit: Payer: Self-pay | Admitting: Family Medicine

## 2009-04-14 DIAGNOSIS — R071 Chest pain on breathing: Secondary | ICD-10-CM | POA: Insufficient documentation

## 2009-04-22 ENCOUNTER — Encounter (HOSPITAL_COMMUNITY): Admission: RE | Admit: 2009-04-22 | Discharge: 2009-05-22 | Payer: Self-pay | Admitting: Oncology

## 2009-04-22 ENCOUNTER — Ambulatory Visit (HOSPITAL_COMMUNITY): Payer: Self-pay | Admitting: Oncology

## 2009-05-11 ENCOUNTER — Ambulatory Visit: Payer: Self-pay | Admitting: Orthopedic Surgery

## 2009-05-27 ENCOUNTER — Telehealth (INDEPENDENT_AMBULATORY_CARE_PROVIDER_SITE_OTHER): Payer: Self-pay | Admitting: *Deleted

## 2009-06-08 ENCOUNTER — Ambulatory Visit (HOSPITAL_COMMUNITY): Admission: RE | Admit: 2009-06-08 | Discharge: 2009-06-08 | Payer: Self-pay | Admitting: Internal Medicine

## 2009-08-29 ENCOUNTER — Ambulatory Visit: Payer: Self-pay | Admitting: Internal Medicine

## 2009-09-06 ENCOUNTER — Encounter (INDEPENDENT_AMBULATORY_CARE_PROVIDER_SITE_OTHER): Payer: Self-pay | Admitting: Internal Medicine

## 2009-09-09 ENCOUNTER — Encounter (INDEPENDENT_AMBULATORY_CARE_PROVIDER_SITE_OTHER): Payer: Self-pay | Admitting: *Deleted

## 2009-09-14 ENCOUNTER — Encounter: Payer: Self-pay | Admitting: Internal Medicine

## 2009-09-15 ENCOUNTER — Encounter: Payer: Self-pay | Admitting: Internal Medicine

## 2009-09-15 LAB — CONVERTED CEMR LAB: Creatinine, Ser: 1.08 mg/dL (ref 0.40–1.20)

## 2009-09-19 ENCOUNTER — Ambulatory Visit (HOSPITAL_COMMUNITY): Admission: RE | Admit: 2009-09-19 | Discharge: 2009-09-19 | Payer: Self-pay | Admitting: Internal Medicine

## 2009-09-26 ENCOUNTER — Encounter: Payer: Self-pay | Admitting: Internal Medicine

## 2009-09-29 ENCOUNTER — Encounter (INDEPENDENT_AMBULATORY_CARE_PROVIDER_SITE_OTHER): Payer: Self-pay | Admitting: *Deleted

## 2009-10-21 ENCOUNTER — Encounter (HOSPITAL_COMMUNITY): Admission: RE | Admit: 2009-10-21 | Discharge: 2009-11-20 | Payer: Self-pay | Admitting: Oncology

## 2009-10-21 ENCOUNTER — Ambulatory Visit (HOSPITAL_COMMUNITY): Payer: Self-pay | Admitting: Oncology

## 2009-10-24 ENCOUNTER — Encounter: Payer: Self-pay | Admitting: Internal Medicine

## 2009-11-17 ENCOUNTER — Ambulatory Visit (HOSPITAL_COMMUNITY): Admission: RE | Admit: 2009-11-17 | Discharge: 2009-11-17 | Payer: Self-pay | Admitting: Family Medicine

## 2009-12-07 ENCOUNTER — Encounter: Admission: RE | Admit: 2009-12-07 | Discharge: 2009-12-07 | Payer: Self-pay | Admitting: Neurosurgery

## 2009-12-19 ENCOUNTER — Encounter: Payer: Self-pay | Admitting: Cardiology

## 2010-01-05 ENCOUNTER — Encounter (INDEPENDENT_AMBULATORY_CARE_PROVIDER_SITE_OTHER): Payer: Self-pay | Admitting: *Deleted

## 2010-01-05 LAB — CONVERTED CEMR LAB
ALT: 20 units/L
Albumin: 4.1 g/dL
BUN: 19 mg/dL
Calcium: 9.6 mg/dL
Chloride: 100 meq/L
Free T4: 5 ng/dL
HDL: 61 mg/dL
Potassium: 4.3 meq/L
Sodium: 138 meq/L

## 2010-01-06 ENCOUNTER — Ambulatory Visit (HOSPITAL_COMMUNITY): Admission: RE | Admit: 2010-01-06 | Discharge: 2010-01-06 | Payer: Self-pay | Admitting: Pulmonary Disease

## 2010-01-11 ENCOUNTER — Ambulatory Visit: Payer: Self-pay | Admitting: Cardiology

## 2010-01-24 ENCOUNTER — Ambulatory Visit: Payer: Self-pay | Admitting: Thoracic Surgery

## 2010-01-30 ENCOUNTER — Ambulatory Visit (HOSPITAL_COMMUNITY): Admission: RE | Admit: 2010-01-30 | Discharge: 2010-01-30 | Payer: Self-pay | Admitting: Thoracic Surgery

## 2010-02-01 ENCOUNTER — Ambulatory Visit: Payer: Self-pay | Admitting: Thoracic Surgery

## 2010-03-06 ENCOUNTER — Ambulatory Visit (HOSPITAL_COMMUNITY): Payer: Self-pay | Admitting: Oncology

## 2010-03-20 ENCOUNTER — Encounter: Payer: Self-pay | Admitting: Internal Medicine

## 2010-03-28 ENCOUNTER — Ambulatory Visit: Payer: Self-pay | Admitting: Orthopedic Surgery

## 2010-03-28 DIAGNOSIS — G5760 Lesion of plantar nerve, unspecified lower limb: Secondary | ICD-10-CM | POA: Insufficient documentation

## 2010-03-28 DIAGNOSIS — M25819 Other specified joint disorders, unspecified shoulder: Secondary | ICD-10-CM | POA: Insufficient documentation

## 2010-03-28 DIAGNOSIS — M7580 Other shoulder lesions, unspecified shoulder: Secondary | ICD-10-CM

## 2010-04-25 ENCOUNTER — Encounter (HOSPITAL_COMMUNITY): Admission: RE | Admit: 2010-04-25 | Discharge: 2010-05-25 | Payer: Self-pay | Admitting: Oncology

## 2010-04-25 ENCOUNTER — Ambulatory Visit (HOSPITAL_COMMUNITY): Payer: Self-pay | Admitting: Oncology

## 2010-04-27 ENCOUNTER — Ambulatory Visit (HOSPITAL_COMMUNITY): Payer: Self-pay | Admitting: Oncology

## 2010-05-03 ENCOUNTER — Ambulatory Visit: Payer: Self-pay | Admitting: Orthopedic Surgery

## 2010-05-03 DIAGNOSIS — Z96649 Presence of unspecified artificial hip joint: Secondary | ICD-10-CM | POA: Insufficient documentation

## 2010-05-03 DIAGNOSIS — M25559 Pain in unspecified hip: Secondary | ICD-10-CM | POA: Insufficient documentation

## 2010-05-17 DIAGNOSIS — C3490 Malignant neoplasm of unspecified part of unspecified bronchus or lung: Secondary | ICD-10-CM

## 2010-05-17 HISTORY — DX: Malignant neoplasm of unspecified part of unspecified bronchus or lung: C34.90

## 2010-05-31 ENCOUNTER — Encounter: Admission: RE | Admit: 2010-05-31 | Discharge: 2010-05-31 | Payer: Self-pay | Admitting: Thoracic Surgery

## 2010-05-31 ENCOUNTER — Ambulatory Visit: Payer: Self-pay | Admitting: Thoracic Surgery

## 2010-06-06 ENCOUNTER — Ambulatory Visit (HOSPITAL_COMMUNITY): Admission: RE | Admit: 2010-06-06 | Discharge: 2010-06-06 | Payer: Self-pay | Admitting: Thoracic Surgery

## 2010-06-06 HISTORY — PX: LUNG BIOPSY: SHX232

## 2010-06-27 ENCOUNTER — Ambulatory Visit: Admission: RE | Admit: 2010-06-27 | Discharge: 2010-07-21 | Payer: Self-pay | Admitting: Radiation Oncology

## 2010-10-02 ENCOUNTER — Encounter (HOSPITAL_COMMUNITY)
Admission: RE | Admit: 2010-10-02 | Discharge: 2010-11-01 | Payer: Self-pay | Source: Home / Self Care | Admitting: Oncology

## 2010-10-02 ENCOUNTER — Ambulatory Visit (HOSPITAL_COMMUNITY): Payer: Self-pay | Admitting: Oncology

## 2010-10-03 ENCOUNTER — Ambulatory Visit (HOSPITAL_COMMUNITY): Admission: RE | Admit: 2010-10-03 | Discharge: 2010-10-03 | Payer: Self-pay | Admitting: Radiation Oncology

## 2010-11-02 ENCOUNTER — Encounter (HOSPITAL_COMMUNITY)
Admission: RE | Admit: 2010-11-02 | Discharge: 2010-12-02 | Payer: Self-pay | Source: Home / Self Care | Attending: Oncology | Admitting: Oncology

## 2010-12-01 ENCOUNTER — Emergency Department (HOSPITAL_COMMUNITY)
Admission: EM | Admit: 2010-12-01 | Discharge: 2010-12-01 | Payer: Self-pay | Source: Home / Self Care | Admitting: Emergency Medicine

## 2010-12-21 ENCOUNTER — Encounter: Payer: Self-pay | Admitting: Orthopedic Surgery

## 2010-12-21 ENCOUNTER — Ambulatory Visit
Admission: RE | Admit: 2010-12-21 | Discharge: 2010-12-21 | Payer: Self-pay | Source: Home / Self Care | Attending: Orthopedic Surgery | Admitting: Orthopedic Surgery

## 2010-12-21 DIAGNOSIS — S7010XA Contusion of unspecified thigh, initial encounter: Secondary | ICD-10-CM | POA: Insufficient documentation

## 2011-01-04 ENCOUNTER — Other Ambulatory Visit: Payer: Self-pay | Admitting: Radiation Oncology

## 2011-01-04 DIAGNOSIS — C3490 Malignant neoplasm of unspecified part of unspecified bronchus or lung: Secondary | ICD-10-CM

## 2011-01-07 ENCOUNTER — Encounter: Payer: Self-pay | Admitting: Thoracic Surgery

## 2011-01-07 ENCOUNTER — Encounter (HOSPITAL_COMMUNITY): Payer: Self-pay | Admitting: Oncology

## 2011-01-07 ENCOUNTER — Encounter: Payer: Self-pay | Admitting: Neurosurgery

## 2011-01-14 LAB — CONVERTED CEMR LAB
ALT: 43 units/L — ABNORMAL HIGH (ref 0–35)
AST: 44 units/L — ABNORMAL HIGH (ref 0–37)
Albumin: 3.5 g/dL (ref 3.5–5.2)
Alkaline Phosphatase: 89 units/L (ref 39–117)
BUN: 20 mg/dL (ref 6–23)
Basophils Absolute: 0 10*3/uL (ref 0.0–0.1)
Basophils Relative: 1 % (ref 0–1)
Blood Glucose, Fingerstick: 214
Eosinophils Absolute: 0.1 10*3/uL (ref 0.0–0.7)
Glucose, Bld: 140 mg/dL — ABNORMAL HIGH (ref 70–99)
HCT: 35.5 % — ABNORMAL LOW (ref 36.0–46.0)
HCV Ab: NEGATIVE
Hemoglobin: 12 g/dL
Hep B S Ab: NEGATIVE
MCHC: 32.7 g/dL (ref 30.0–36.0)
Microalb, Ur: 0.2 mg/dL (ref 0.00–1.89)
Monocytes Absolute: 0.4 10*3/uL (ref 0.2–0.7)
Monocytes Relative: 11 % (ref 3–11)
Neutro Abs: 1.6 10*3/uL — ABNORMAL LOW (ref 1.7–7.7)
Neutrophils Relative %: 46 % (ref 43–77)
Platelets: 160 10*3/uL (ref 150–400)
Potassium: 3.8 meq/L (ref 3.5–5.3)
RBC: 3.8 M/uL — ABNORMAL LOW (ref 3.87–5.11)
RDW: 14.2 % — ABNORMAL HIGH (ref 11.5–14.0)
Sed Rate: 18 mm/hr (ref 0–22)
Sodium: 137 meq/L (ref 135–145)
Total Bilirubin: 0.6 mg/dL (ref 0.3–1.2)
Total Protein: 6.5 g/dL (ref 6.0–8.3)
WBC: 3.5 10*3/uL — ABNORMAL LOW (ref 4.0–10.5)

## 2011-01-18 NOTE — Assessment & Plan Note (Signed)
Summary: YEARLY RE-CK/XRAYS/BILAT HIPS/MEDICARE,MUT OF OM/CAF   Visit Type:  Follow-up Primary Provider:  Dr. Lucia Gaskins  CC:  bilateral hip .  History of Present Illness: This is a 77 year old female had bilateral hip replacements the RIGHT one was done in [place] the LEFT one was done later by me she comes in for followup she does have some pain in the RIGHT hip flexor and complains of difficulty climbing stairs and lifting her RIGHT leg the pain radiates into the upper thigh but not to the knee and is not neurologic in character.  Xrays today.  DOS 04-23-06. Left hip replacement.  this will be a four-year followup on a LEFT total hip arthroplasty  x-rays of both hips show that they are functioning well  Tenderness over the RIGHT flexor tendon of the hip injected   Verbal consent was obtained. The hip over the greater trochanter was prepped with alcohol and ethyl chloride. depomedrol 12m/cc and sensorcaine .25% 1 cc each was injected. there were no coplications.   Current Medications (verified): 1)  Multivitamins  Tabs (Multiple Vitamin) 2)  Slo-Niacin 500 Mg Tbcr (Niacin) ..Marland Kitchen. 1 By Mouth Once Daily 3)  Ferrous Sulfate Cr 160 (50 Fe) Mg  Tbcr (Ferrous Sulfate Dried) ..Marland Kitchen. 1 By Mouth Once Daily 4)  Aspirin 81 Mg Tbec (Aspirin) ..Marland Kitchen. 1 By Mouth Once Daily 5)  Zometa 4 Mg/55mConc (Zoledronic Acid) .... Q 6 Months Iv 6)  Freestyle Glucometer Strips .... Use As Directed 7)  Omeprazole 20 Mg  Tbec (Omeprazole) ...Marland Kitchen 1 By Mouth Once Daily 8)  Atenolol 25 Mg  Tabs (Atenolol) .... 1/2 By Mouth Once Daily 9)  Fibercon 625 Mg  Tabs (Calcium Polycarbophil) ...Marland Kitchen 1 With Breakfast, 2 With Lunch and 2 With Dinner 10)  Folic Acid 1 Mg Tabs (Folic Acid) .... Take 1 Tab Daily 11)  Nabumetone 750 Mg Tabs (Nabumetone) .... As Needed For Pain  Allergies (verified): 1)  ! Ibuprofen 2)  ! * Statins 3)  ! * Tape 4)  ! * Alvera NovelPast History:  Past Medical History: Last updated:  01/06/2010 Allergic rhinitis Anemia-NOS Diabetes mellitus, type II GERD Hyperlipidemia Low back pain Osteoarthritis Osteoporosis PSVT OAB Lymphedema-left arm Rheumatic fever, hx-age 53 Pneumonia, RLL c/b sepsis Breast cancer, left--recurrent with ? mets to liver L and R biceps tendon rupture Anxiety Elevated liver enzymes--bx with steatohepatitis Right thyroid nodule  Past Surgical History: Last updated: 01/06/2010 Cataract extraction w/ lens implant-2003 Cholecystectomy-1965 Total hip replacement-r-2005 Hysterectomy-1964 Lumpectomy c/ XRT-2001 Cystoscopy-2007 Breast biopsy x 3 w/ cystectomy THA-left-2007 EGD-1/07 Colonoscopy Liver biopsy 2008  Review of Systems Neurologic:  Denies numbness and tingling. Musculoskeletal:  Complains of muscle pain.  Physical Exam  Additional Exam:  General exam normal body habitus no deformity  Cardiovascular findings are normal with good pulses  Skin is normal  Sensation is normal in both legs  She has tenderness in the RIGHT hip although the flexion is normal it is painful LEFT hip flexion normal motor exam is normal.  There is tenderness over the hip flexors.     Impression & Recommendations:  Problem # 1:  HIP PAIN (ICD-719.45)  some tendinitis noted in the RIGHT hip recommend injection  RIGHT hip injection Verbal consent was obtained. The hip over the Point of maximal tendernesr was prepped with alcohol and ethyl chloride. depomedrol 4065mc and sensorcaine .25% 1 cc each was injected. there were no coplications.  Her updated medication list for this problem includes:    Aspirin 81  Mg Tbec (Aspirin) .Marland Kitchen... 1 by mouth once daily    Nabumetone 750 Mg Tabs (Nabumetone) .Marland Kitchen... As needed for pain  Orders: Est. Patient Level III (27639) Depo- Medrol 83m (J1030) Joint Aspirate / Injection, Large ((43200  Problem # 2:  IMPINGEMENT SYNDROME (ICD-726.2) resolved  Problem # 3:  TOTAL HIP FOLLOW-UP  (ICD-V43.64) Assessment: Comment Only  AP pelvis and bilateral AP and lateral of the hips bilateral total hip arthroplasties.  The RIGHT hip has a cerclage wire from a presumed intra-articular fracture.  Both hips appear to be functioning well.  There is some verticality of the RIGHT acetabular component but does not seem to be affecting where the  Orders: Est. Patient Level III ((37944  Patient Instructions: 1)  You have received an injection of cortisone today. You may experience increased pain at the injection site. Apply ice pack to the area for 20 minutes every 2 hours and take 2 xtra strength tylenol every 8 hours. This increased pain will usually resolve in 24 hours. The injection will take effect in 3-10 days.  2)  annual f/u for THA  3)  as needed after injection

## 2011-01-18 NOTE — Assessment & Plan Note (Signed)
Summary: past due for 1 yr f/u/tg   Visit Type:  Follow-up Primary Provider:  Dr. Lucia Gaskins   History of Present Illness: 77 year old woman presents for a followup visit. She reports having a fall (lost her balance without syncope) late last year resulting in lower back and thigh pain. She had to take a course of prednisone and during this time and in the setting of discomfort, had an increase in palpitations. Otherwise she has only rare palpitations, no dizziness, no chest pain, and stable NYHA class II dyspnea on exertion.  Recent labs from 20 January include ALT 20, AST 27, BUN 19, creatinine 1.1, potassium 4.3, sodium 138, LDL 124, HDL 61, triglycerides 116, cholesterol 208, TSH 1.9.  I subsequently noted in EMR a recent CT scan of the chest ordered by Dr. Luan Pulling demonstrating a 16 x 8 mm left lower lobe opacity, increased compared to prior study, and worrisome for malignancy. She saw Dr. Luan Pulling in consultation back in October for followup of this area. She is due to see Dr. Luan Pulling for a followup visit on 15 February. She did not mention any progressive shortness of breath, cough, or hemoptysis during her visit.  Current Medications (verified): 1)  Multivitamins  Tabs (Multiple Vitamin) 2)  Slo-Niacin 500 Mg Tbcr (Niacin) .Marland Kitchen.. 1 By Mouth Once Daily 3)  Ferrous Sulfate Cr 160 (50 Fe) Mg  Tbcr (Ferrous Sulfate Dried) .Marland Kitchen.. 1 By Mouth Once Daily 4)  Aspirin 81 Mg Tbec (Aspirin) .Marland Kitchen.. 1 By Mouth Once Daily 5)  Zometa 4 Mg/49m Conc (Zoledronic Acid) .... Q 6 Months Iv 6)  Freestyle Glucometer Strips .... Use As Directed 7)  Omeprazole 20 Mg  Tbec (Omeprazole) ..Marland Kitchen. 1 By Mouth Once Daily 8)  Atenolol 25 Mg  Tabs (Atenolol) .... 1/2 By Mouth Once Daily 9)  Fibercon 625 Mg  Tabs (Calcium Polycarbophil) ..Marland Kitchen. 1 With Breakfast, 2 With Lunch and 2 With Dinner 10)  Folic Acid 1 Mg Tabs (Folic Acid) .... Take 1 Tab Daily 11)  Nabumetone 750 Mg Tabs (Nabumetone) .... As Needed For  Pain  Allergies (verified): 1)  ! Ibuprofen 2)  ! * Statins 3)  ! * Tape 4)  ! *Alvera Novel Past History:  Past Medical History: Last updated: 01/06/2010 Allergic rhinitis Anemia-NOS Diabetes mellitus, type II GERD Hyperlipidemia Low back pain Osteoarthritis Osteoporosis PSVT OAB Lymphedema-left arm Rheumatic fever, hx-age 23 Pneumonia, RLL c/b sepsis Breast cancer, left--recurrent with ? mets to liver L and R biceps tendon rupture Anxiety Elevated liver enzymes--bx with steatohepatitis Right thyroid nodule  Social History: Last updated: 01/06/2010 Married lives with husband Never Smoked Alcohol use-no Drug use-no  Clinical Review Panels:  Echocardiogram Echocardiogram REFERRING:  [facility].      CLINICAL DATA:  An 77year old woman with dyspnea.      M-mode:  Aorta 3.3, left atrium 2.2, septum 0.9, posterior wall 1.0, LV   diastole 3.7, LV systole 3.0.      1. Technically suboptimal but adequate echocardiographic study.   2. Normal left atrium, right atrium and right ventricle.   3. Normal diameter of the proximal ascending aorta; mild calcification       of the wall and annulus.   4. Mild sclerosis of the aortic valve with normal function.   5. Mild mitral valve thickening; mild annular calcification.   6. Normal tricuspid valve.   7. Pulmonic valve and proximal pulmonary artery not adequately imaged.   8. Normal internal dimension, wall thickness, regional and global  function of the left ventricle.   9. Normal IVC.   10.Comparison with prior study of May 06, 2006:  No significant       interval change.               Cristopher Estimable. Lattie Haw, MD, [facility]   Electronically Signed            RMR/MEDQ  D:  07/07/2007  T:  07/08/2007  Job:  [id number]  (07/07/2007)    Family History: Father - deceased-63 HTN, CVA, CHF Mother - deceased-93 CHF, osteoporosis Brother - deceased-78 leukemia, heart failure  Review of Systems  The patient denies  anorexia, fever, weight loss, chest pain, syncope, peripheral edema, prolonged cough, headaches, hemoptysis, abdominal pain, melena, and hematochezia.         Chronic problems with balance. She uses canes for ambulation. No recent falls. Otherwise reviewed and negative.  Vital Signs:  Patient profile:   77 year old female Weight:      148 pounds Pulse rate:   74 / minute BP sitting:   115 / 54  (right arm)  Vitals Entered By: Doretha Sou, CNA (January 11, 2010 12:44 PM)  Physical Exam  Additional Exam:  Pleasant elderly woman, in no acute distress. HEENT: Conjunctiva and lids normal, oropharynx clear. Neck: Supple, no elevated jugular venous pressure or carotid bruits. Lungs: Clear to auscultation, diminished, nonlabored. Cardiac: Regular rate and rhythm, no S3. Abdomen: Soft, nontender. Extremity: No pitting edema.   EKG  Procedure date:  01/11/2010  Findings:      Normal sinus rhythm at 66 beats per minute with sinus arrhythmia.  Impression & Recommendations:  Problem # 1:  PAROXYSMAL SUPRAVENTRICULAR TACHYCARDIA (ICD-427.0)  Relatively well controlled on low-dose beta blocker therapy. She continues on aspirin as well. There was an increase in palpitations during a course of prednisone late last year, although these have settled down significantly. Electrocardiogram is stable. No medication changes will be made at this point. Annual followup visit will be scheduled.  Her updated medication list for this problem includes:    Aspirin 81 Mg Tbec (Aspirin) .Marland Kitchen... 1 by mouth once daily    Atenolol 25 Mg Tabs (Atenolol) .Marland Kitchen... 1/2 by mouth once daily  Problem # 2:  HYPERLIPIDEMIA (ICD-272.4)  Followed by Dr. Cindie Laroche. She continues on niacin.  Her updated medication list for this problem includes:    Slo-niacin 500 Mg Tbcr (Niacin) .Marland Kitchen... 1 by mouth once daily  Patient Instructions: 1)  Your physician recommends that you schedule a follow-up appointment in: 1 year 2)   Your physician recommends that you continue on your current medications as directed. Please refer to the Current Medication list given to you today.

## 2011-01-18 NOTE — Assessment & Plan Note (Signed)
Summary: WANTS INJECTION RT SHOULDER NO RECENT XR/MEDICARE/BSF   Visit Type:  Follow-up Primary Provider:  Dr. Lucia Gaskins  CC:  right shoulder pain.  History of Present Illness: an 77 year old female with RIGHT shoulder pain. Dull pain, which is constant, worse when lying down or lifting or raising her arm came on gradually. No trauma history previous injection complains of some catching as well.  Pain scale 4-7 /10  Allergies: 1)  ! Ibuprofen 2)  ! * Statins 3)  ! * Tape 4)  ! Alvera Novel  Physical Exam  Additional Exam:   * VS reviewed and were normal   *GEN: appearance was normal   ** CDV: normal pulses temperature and no edema  * LYMPH nodes were normal   * SKIN was normal   * Neuro: normal sensation ** Psyche: AAO x 3 and mood was normal   MSK *Gait was abnormal   The shoulder is tender over the anterolateral acromion, there is no swelling, the shoulder is stable, the SubScap is 5/5, the EXT/ROT are 5/5, the SSpinatus is 4/5. The impingement sign is positive. ROM: EXT/ROT=  45            INT/ROT=    n/t         FLEXION=  120                 Impression & Recommendations:  Problem # 1:  IMPINGEMENT SYNDROME (ICD-726.2) Assessment New  2 views of the glenohumeral joint   The glenohumeral joint space is normal. The bony anatomy is without bone lesion. The acromion is a Type II  NORMAL SHOULDER   RIGHT shoulder  Verbal consent obtained/The shoulder was injected with depomedrol 29m/cc and sensorcaine .25% . There were no complications  Orders: Est. Patient Level IV ((56979 Depo- Medrol 435m(J1030) Joint Aspirate / Injection, Large (20610) Shoulder x-ray,  minimum 2 views ([phone] Patient Instructions: 1)  You have received an injection of cortisone today. You may experience increased pain at the injection site. Apply ice pack to the area for 20 minutes every 2 hours and take 2 xtra strength tylenol every 8 hours. This increased pain will usually resolve  in 24 hours. The injection will take effect in 3-10 days.  2)  Limit activity to comfort and avoid activities that increase discomfort.  Apply moist heat and/or ice to shoulder and take medication as instructed for pain relief. Please read the Shoulder Pain Handout. 3)  Please schedule a follow-up appointment as needed.

## 2011-01-18 NOTE — Letter (Signed)
Summary: History form  History form   Imported By: Ruffin Pyo 12/21/2010 15:32:09  _____________________________________________________________________  External Attachment:    Type:   Image     Comment:   External Document

## 2011-01-18 NOTE — Assessment & Plan Note (Signed)
Summary: [facility] FOL/UP/NEW INJ LT HIP/FALL 12/01/10/XR [facility]/MEDICARE+SUP...   Visit Type:  Follow-up Primary Provider:  Dr. Lucia Gaskins  CC:  left hip pain.  History of Present Illness: This is a 77 year old female had bilateral hip replacements the RIGHT one was done in [place] the LEFT one was done later by me she comes in for followup she does have some pain in the RIGHT hip flexor and complains of difficulty climbing stairs and lifting her RIGHT leg the pain radiates into the upper thigh but not to the knee and is not neurologic in character.  DOS 04-23-06. Left hip replacement.  Today she is in the office complaining of left hip pain after fall on 12/01/10, she also has left rib pain.  Xrays taken [facility] 12/01/10 of the left hip for review.  Using walker today, doing better.  Feels like her muscles are sore in her posterior and anterior thigh.  Left hip is still sore, no numbness.  She caould not lift her left leg for a week after fall.  No pain meds have been taken in a week, er gave her Norco 5 and Relafen 524m, this helped.  She has been doing exercises to strengthen her leg.    Allergies: 1)  ! Ibuprofen 2)  ! * Statins 3)  ! * Tape 4)  ! *Alvera Novel Physical Exam  Additional Exam:  examination  Patient is well-developed well-nourished female with a scoliotic spine or and placed with a walker in a flexed pelvic lumbar junction posture.  His normal pulse and effusion LEFT lower extremity without lymphadenopathy skin is intact incision over LEFT hip is normal  No neurologic deficits are seen in the LEFT lower extremity and she's awake alert and oriented x3  She is ambulatory with full weightbearing on the LEFT leg using a walker despite her flexion posture and grocery cart sign.  She has normal range of motion in the LEFT hip with tenderness in the LEFT gluteal region.  No strength deficits are noted in the hip joint prosthesis are stable to   Impression &  Recommendations:  Problem # 1:  CONTUSION OF THIGH (ICD-924.00)  [facility] and the films have been reviewed. 5 views of the hip one is the pelvis LEFT hip films show that there is no change in the position of the prosthesis no acute complicating features.  Appreciated.  The prosthesis appears to be stable  Orders: Est. Patient Level III ((56314  Problem # 2:  HIP PAIN (ICD-719.45)  Her updated medication list for this problem includes:    Aspirin 81 Mg Tbec (Aspirin) ..Marland Kitchen.. 1 by mouth once daily    Nabumetone 750 Mg Tabs (Nabumetone) ..Marland Kitchen.. As needed for pain  Patient Instructions: 1)  Continue the antiinflamatory and the walker until you are comfortable  2)  Please schedule a follow-up appointment as needed.   Orders Added: 1)  Est. Patient Level III [[97026]

## 2011-01-18 NOTE — Miscellaneous (Signed)
Summary: atenolol refill  Clinical Lists Changes  Medications: Rx of ATENOLOL 25 MG  TABS (ATENOLOL) 1/2 by mouth once daily;  #30 x 1;  Signed;  Entered by: Tye Savoy RN;  Authorized by: Beckie Salts, MD, [facility];  Method used: Electronically to [facility] [place], [place], [place], [place]  [address], Ph: [phone], Fax: [phone]    Prescriptions: ATENOLOL 25 MG  TABS (ATENOLOL) 1/2 by mouth once daily  #30 x 1   Entered by:   Tye Savoy RN   Authorized by:   Beckie Salts, MD, [facility]   Signed by:   Tye Savoy RN on 12/19/2009   Method used:   Electronically to        [facility] (retail)       [place] [address]       [place], [place]  [address]       Ph: [phone]       Fax: [phone]   RxID:   [id number]

## 2011-01-18 NOTE — Miscellaneous (Signed)
Summary: LABS CMP,LIPID,TSH,T4 09/26/2009  Clinical Lists Changes  Observations: Added new observation of CALCIUM: 9.6 mg/dL (01/05/2010 15:13) Added new observation of ALBUMIN: 4.1 g/dL (01/05/2010 15:13) Added new observation of PROTEIN, TOT: 7.1 g/dL (01/05/2010 15:13) Added new observation of SGPT (ALT): 20 units/L (01/05/2010 15:13) Added new observation of SGOT (AST): 27 units/L (01/05/2010 15:13) Added new observation of ALK PHOS: 67 units/L (01/05/2010 15:13) Added new observation of CREATININE: 1.17 mg/dL (01/05/2010 15:13) Added new observation of BUN: 19 mg/dL (01/05/2010 15:13) Added new observation of BG RANDOM: 70 mg/dL (01/05/2010 15:13) Added new observation of CO2 PLSM/SER: 25 meq/L (01/05/2010 15:13) Added new observation of CL SERUM: 100 meq/L (01/05/2010 15:13) Added new observation of K SERUM: 4.3 meq/L (01/05/2010 15:13) Added new observation of NA: 138 meq/L (01/05/2010 15:13) Added new observation of LDL: 124 mg/dL (01/05/2010 15:13) Added new observation of HDL: 61 mg/dL (01/05/2010 15:13) Added new observation of TRIGLYC TOT: 116 mg/dL (01/05/2010 15:13) Added new observation of CHOLESTEROL: 208 mg/dL (01/05/2010 15:13) Added new observation of TSH: 1.929 microintl units/mL (01/05/2010 15:13) Added new observation of T4, FREE: 5.0 ng/dL (01/05/2010 15:13)

## 2011-01-18 NOTE — Letter (Signed)
Summary: History form  History form   Imported By: Ruffin Pyo 04/03/2010 14:05:51  _____________________________________________________________________  External Attachment:    Type:   Image     Comment:   External Document

## 2011-01-18 NOTE — Letter (Signed)
Summary: [facility]   Imported By: Zeb Comfort 03/20/2010 16:17:09  _____________________________________________________________________  External Attachment:    Type:   Image     Comment:   External Document

## 2011-02-01 ENCOUNTER — Encounter (HOSPITAL_COMMUNITY): Payer: Medicare Other | Attending: Oncology

## 2011-02-01 ENCOUNTER — Other Ambulatory Visit (HOSPITAL_COMMUNITY): Payer: Medicare Other

## 2011-02-01 DIAGNOSIS — C3490 Malignant neoplasm of unspecified part of unspecified bronchus or lung: Secondary | ICD-10-CM | POA: Insufficient documentation

## 2011-02-01 DIAGNOSIS — Z853 Personal history of malignant neoplasm of breast: Secondary | ICD-10-CM | POA: Insufficient documentation

## 2011-02-01 DIAGNOSIS — Z09 Encounter for follow-up examination after completed treatment for conditions other than malignant neoplasm: Secondary | ICD-10-CM | POA: Insufficient documentation

## 2011-02-01 DIAGNOSIS — C50919 Malignant neoplasm of unspecified site of unspecified female breast: Secondary | ICD-10-CM

## 2011-02-08 ENCOUNTER — Ambulatory Visit (HOSPITAL_COMMUNITY)
Admission: RE | Admit: 2011-02-08 | Discharge: 2011-02-08 | Disposition: A | Discharge status: Home / Self Care | Payer: Medicare Other | Source: Ambulatory Visit | Attending: Radiation Oncology | Admitting: Radiation Oncology

## 2011-02-08 ENCOUNTER — Encounter (HOSPITAL_COMMUNITY): Payer: Self-pay

## 2011-02-08 DIAGNOSIS — C3490 Malignant neoplasm of unspecified part of unspecified bronchus or lung: Secondary | ICD-10-CM | POA: Insufficient documentation

## 2011-02-08 DIAGNOSIS — E049 Nontoxic goiter, unspecified: Secondary | ICD-10-CM | POA: Insufficient documentation

## 2011-02-08 DIAGNOSIS — Z923 Personal history of irradiation: Secondary | ICD-10-CM | POA: Insufficient documentation

## 2011-02-08 MED ORDER — IOHEXOL 300 MG/ML  SOLN
80.0000 mL | Freq: Once | INTRAMUSCULAR | Status: AC | PRN
  Administered 2011-02-08: 80 mL via INTRAVENOUS

## 2011-02-09 ENCOUNTER — Ambulatory Visit: Payer: Medicare Other | Attending: Radiation Oncology | Admitting: Radiation Oncology

## 2011-02-13 ENCOUNTER — Other Ambulatory Visit: Payer: Self-pay | Admitting: Radiation Oncology

## 2011-02-13 DIAGNOSIS — E041 Nontoxic single thyroid nodule: Secondary | ICD-10-CM

## 2011-02-13 DIAGNOSIS — C3490 Malignant neoplasm of unspecified part of unspecified bronchus or lung: Secondary | ICD-10-CM

## 2011-02-15 ENCOUNTER — Ambulatory Visit (HOSPITAL_COMMUNITY)
Admission: RE | Admit: 2011-02-15 | Discharge: 2011-02-15 | Disposition: A | Discharge status: Home / Self Care | Payer: Medicare Other | Source: Ambulatory Visit | Attending: Radiation Oncology | Admitting: Radiation Oncology

## 2011-02-15 DIAGNOSIS — E041 Nontoxic single thyroid nodule: Secondary | ICD-10-CM

## 2011-02-15 DIAGNOSIS — E049 Nontoxic goiter, unspecified: Secondary | ICD-10-CM | POA: Insufficient documentation

## 2011-02-15 DIAGNOSIS — C3490 Malignant neoplasm of unspecified part of unspecified bronchus or lung: Secondary | ICD-10-CM

## 2011-02-20 ENCOUNTER — Ambulatory Visit: Payer: Self-pay | Admitting: Cardiology

## 2011-02-27 LAB — COMPREHENSIVE METABOLIC PANEL
ALT: 21 U/L (ref 0–35)
AST: 34 U/L (ref 0–37)
Albumin: 3.4 g/dL — ABNORMAL LOW (ref 3.5–5.2)
CO2: 29 mEq/L (ref 19–32)
Calcium: 9.8 mg/dL (ref 8.4–10.5)
GFR calc Af Amer: >60 mL/min (ref >60)
Sodium: 134 mEq/L — ABNORMAL LOW (ref 135–145)
Total Protein: 7.1 g/dL (ref 6.0–8.3)

## 2011-02-28 LAB — CREATININE, SERUM
GFR calc Af Amer: 56 mL/min — ABNORMAL LOW (ref >60)
GFR calc non Af Amer: 46 mL/min — ABNORMAL LOW (ref >60)

## 2011-03-04 LAB — PROTIME-INR: Prothrombin Time: 12.6 seconds (ref 11.6–15.2)

## 2011-03-04 LAB — APTT: aPTT: 23 seconds — ABNORMAL LOW (ref 24–37)

## 2011-03-04 LAB — CBC
MCV: 97.9 fL (ref 78.0–100.0)
Platelets: 241 10*3/uL (ref 150–400)
WBC: 6.4 10*3/uL (ref 4.0–10.5)

## 2011-03-05 ENCOUNTER — Ambulatory Visit (HOSPITAL_COMMUNITY)
Admission: RE | Admit: 2011-03-05 | Discharge: 2011-03-05 | Disposition: A | Discharge status: Home / Self Care | Payer: Medicare Other | Source: Ambulatory Visit | Attending: Family Medicine | Admitting: Family Medicine

## 2011-03-05 ENCOUNTER — Other Ambulatory Visit (HOSPITAL_COMMUNITY): Payer: Self-pay | Admitting: Family Medicine

## 2011-03-05 ENCOUNTER — Ambulatory Visit (HOSPITAL_COMMUNITY): Payer: Medicare Other | Admitting: Oncology

## 2011-03-05 DIAGNOSIS — J189 Pneumonia, unspecified organism: Secondary | ICD-10-CM

## 2011-03-05 DIAGNOSIS — Z85118 Personal history of other malignant neoplasm of bronchus and lung: Secondary | ICD-10-CM | POA: Insufficient documentation

## 2011-03-06 LAB — COMPREHENSIVE METABOLIC PANEL
AST: 31 U/L (ref 0–37)
CO2: 28 mEq/L (ref 19–32)
Calcium: 9.4 mg/dL (ref 8.4–10.5)
Creatinine, Ser: 1.23 mg/dL — ABNORMAL HIGH (ref 0.4–1.2)
GFR calc Af Amer: 50 mL/min — ABNORMAL LOW (ref >60)
GFR calc non Af Amer: 41 mL/min — ABNORMAL LOW (ref >60)

## 2011-03-13 ENCOUNTER — Ambulatory Visit (HOSPITAL_COMMUNITY): Payer: Medicare Other | Admitting: Oncology

## 2011-03-13 ENCOUNTER — Other Ambulatory Visit (HOSPITAL_COMMUNITY): Payer: Self-pay | Admitting: Oncology

## 2011-03-13 DIAGNOSIS — C3490 Malignant neoplasm of unspecified part of unspecified bronchus or lung: Secondary | ICD-10-CM

## 2011-03-13 DIAGNOSIS — C50919 Malignant neoplasm of unspecified site of unspecified female breast: Secondary | ICD-10-CM

## 2011-03-21 LAB — COMPREHENSIVE METABOLIC PANEL
ALT: 24 U/L (ref 0–35)
AST: 29 U/L (ref 0–37)
Albumin: 3.8 g/dL (ref 3.5–5.2)
Alkaline Phosphatase: 71 U/L (ref 39–117)
Glucose, Bld: 69 mg/dL — ABNORMAL LOW (ref 70–99)
Potassium: 3.8 mEq/L (ref 3.5–5.1)
Sodium: 137 mEq/L (ref 135–145)
Total Protein: 7.6 g/dL (ref 6.0–8.3)

## 2011-03-27 LAB — COMPREHENSIVE METABOLIC PANEL
BUN: 23 mg/dL (ref 6–23)
CO2: 28 mEq/L (ref 19–32)
Calcium: 9.4 mg/dL (ref 8.4–10.5)
Creatinine, Ser: 1.1 mg/dL (ref 0.4–1.2)
GFR calc Af Amer: 57 mL/min — ABNORMAL LOW (ref >60)
GFR calc non Af Amer: 47 mL/min — ABNORMAL LOW (ref >60)
Glucose, Bld: 117 mg/dL — ABNORMAL HIGH (ref 70–99)

## 2011-03-28 ENCOUNTER — Encounter: Payer: Self-pay | Admitting: Cardiology

## 2011-03-29 ENCOUNTER — Encounter: Payer: Self-pay | Admitting: Cardiology

## 2011-03-29 ENCOUNTER — Ambulatory Visit (INDEPENDENT_AMBULATORY_CARE_PROVIDER_SITE_OTHER): Payer: Medicare Other | Admitting: Cardiology

## 2011-03-29 VITALS — BP 122/58 | HR 80 | Ht 64.0 in | Wt 146.0 lb

## 2011-03-29 DIAGNOSIS — I471 Supraventricular tachycardia: Secondary | ICD-10-CM

## 2011-03-29 DIAGNOSIS — E782 Mixed hyperlipidemia: Secondary | ICD-10-CM

## 2011-03-29 NOTE — Assessment & Plan Note (Signed)
Symptomatically stable on low-dose beta blocker therapy. ECG reviewed above. Anticipate as needed followup at this time.

## 2011-03-29 NOTE — Patient Instructions (Signed)
Your physician recommends that you schedule a follow-up appointment in: as needed Your physician recommends that you continue on your current medications as directed. Please refer to the Current Medication list given to you today.

## 2011-03-29 NOTE — Progress Notes (Signed)
Clinical Summary [name] is a 77 y.o.female seen in January 2011 for followup of PSVT, presents now passed due for followup. She has had a number of other health concerns over the last year, unfortunately has not been bothered by any significant degree of palpitations on low-dose beta blocker therapy. She has had no frank syncope or exertional chest pain.  ECG is reviewed below. [name] indicated that she would prefer to keep visits on an as-needed basis since she has been relatively stable, not unreasonable given her other frequent health care visits.   Allergies  Allergen Reactions  . Ibuprofen   . Statins     Current outpatient prescriptions:aspirin 81 MG EC tablet, Take 81 mg by mouth daily.  , Disp: , Rfl: ;  atenolol (TENORMIN) 25 MG tablet, Take 12.5 mg by mouth daily. Take 1/2 of a 25 mg tablet , Disp: , Rfl: ;  Biotin 1000 MCG tablet, Take 1,000 mcg by mouth daily.  , Disp: , Rfl: ;  ferrous sulfate dried (SLOW FE) 160 (50 FE) MG TBCR, Take 160 mg by mouth daily.  , Disp: , Rfl:  folic acid (FOLVITE) 1 MG tablet, Take 1 mg by mouth daily.  , Disp: , Rfl: ;  glucose blood test strip, 1 each as needed. Use as instructed , Disp: , Rfl: ;  Multiple Vitamin (MULTIVITAMIN) capsule, Take 1 capsule by mouth daily.  , Disp: , Rfl: ;  niacin (SLO-NIACIN) 500 MG tablet, Take 500 mg by mouth daily.  , Disp: , Rfl: ;  omeprazole (PRILOSEC) 20 MG capsule, Take 20 mg by mouth daily.  , Disp: , Rfl:  polycarbophil (FIBERCON) 625 MG tablet, Take 625 mg by mouth. Take 1 tablet at breakfast, 2 tablets at lunch and 2 tablets with dinner , Disp: , Rfl: ;  Zoledronic Acid 4 MG/100ML SOLN, Inject 4 mg into the vein every 6 (six) months.  , Disp: , Rfl: ;  DISCONTD: nabumetone (RELAFEN) 750 MG tablet, Take 750 mg by mouth as needed. For pain , Disp: , Rfl:   Past Medical History  Diagnosis Date  . Lung cancer     Left 2011 - rad x 3  . Rheumatic fever     Age 64  . Pneumonia     RLL with sepsis  . Breast  cancer     Left s/p lumpectomy and XRT - recurrent with possible METS to liver  . Biceps tendon rupture     Bilateral  . Elevated liver enzymes     Biopsy consistent with steatohepatitis  . Right thyroid nodule   . Anxiety   . Lymphedema     Left arm  . Overactive bladder   . PSVT (paroxysmal supraventricular tachycardia)   . Osteoporosis   . Osteoarthritis   . Low back pain   . Hyperlipidemia   . GERD (gastroesophageal reflux disease)   . Type 2 diabetes mellitus   . Anemia     NOS  . Allergic rhinitis     Social History [name] reports that she has never smoked. She has never used smokeless tobacco. [name] reports that she does not drink alcohol.  Review of Systems No orthopnea, PND, syncope. Otherwise reviewed and negative.  Physical Examination Filed Vitals:   03/29/11 1259  BP: 122/58  Pulse: 83  Pleasant elderly woman, in no acute distress. HEENT: Conjunctiva and lids normal, oropharynx clear. Neck: Supple, no elevated jugular venous pressure or carotid bruits. Lungs: Clear to auscultation,  diminished, nonlabored. Cardiac: Regular rate and rhythm, no S3. Abdomen: Soft, nontender. Extremity: No pitting edema.   ECG Sinus rhythm at 72 beats per minutes with nonspecific T-wave changes.   Problem List and Plan

## 2011-05-01 NOTE — Consult Note (Signed)
[name]                  ACCOUNT NO.:  [id number]   MEDICAL RECORD NO.:  [id number]          PATIENT TYPE:  INP   LOCATION:  A201                          FACILITY:  [facility]   PHYSICIAN:  Kofi A. Merlene Laughter, M.D. DATE OF BIRTH:  09-Apr-1921   DATE OF CONSULTATION:  DATE OF DISCHARGE:                                 CONSULTATION   NEUROLOGICAL CONSULTATION   HISTORY:  The patient is an 77 year old white female who presents with  about a one year history of episodic symptoms characterized by  relatively acute onset of global weakness, diaphoresis, abdominal  discomfort and sensation of tremors on the inside.  She reports that  these episodic events last several minutes.  Again, they have been  ongoing for the last year.  She reports that recently over the last few  weeks they have become more frequent and seem to become more intense.  In addition she reports the spells seem to last longer up to about 20  minutes.  She has had extensive blood testing which, per the patient,  has been unrevealing.  She was sent to see one of the local neurologist,  Dr. Brandon Melnick and had evaluation for myasthenia gravis with acetylcholine  receptor antibody testing which was unrevealing.  The patient does deny  dysarthria, dysphasia and diplopia.  She was given Prozac by Dr. Brandon Melnick  and started this medication, which apparently made her symptoms  dramatically worse.  She developed essentially a continuous global  weakness to the point of having to be in bed all the time.  She  developed abdominal discomfort, diaphoresis, sweating and shakiness on  the inside.  She again denies any true tremors involving the  extremities.  She appeared to have some mild drowsiness after the  initiation of the Prozac.  This led to the patient being taken to the  emergency room for further evaluation.  The patient indicated that she  has also seen Dr. Laural Golden for her symptoms and has had an EGD among other  tests which  have been unremarkable.  She has been placed on a proton  pump-I  with limited efficacy regarding her symptoms.  She has been on a  NSAID particularly Voltaren, but this was discontinued.  She also was on  Fosamax but this was stopped, again, without any improvement in her  symptoms.   PAST MEDICAL HISTORY:  1. She has a history of breast cancer on the left status post      lumpectomy and radiation therapy.  2. She has a history of kidney stones.  3. Degenerative joint disease.  4. External hemorrhoids.  5. Chronic lymphedema.  6. Scoliosis.  7. Dyslipidemia.  8. Diverticulosis.   PAST SURGICAL HISTORY:  She is status post bilateral hip replacements;  the left being done in May 2007.  Status post cholecystectomy and  hysterectomy.  Biopsy benign breast lesion on the right.   ADMISSION MEDICATIONS:  1. Multivitamin.  2. Calcium with vitamin D.  3. Niacin.  4. Folic acid.  5. A Proton pump inhibitor  6. Aspirin.  7. Iron.  8. Zometa IV.   ALLERGIES:  IBUPROFEN,  K-Y JELLY, TOLECTIN.   FAMILY HISTORY:  Unremarkable.   SOCIAL HISTORY:  She lives with her husband who is in his 68's.  She has  two grown children.  No history of tobacco or alcohol use.   REVIEW OF SYSTEMS:  As in history of present illness.  She has had some  modest weight loss due possibly to her ongoing problems which have been  associated with anorexia.   PHYSICAL EXAMINATION:  GENERAL APPEARANCE:  She is a thin, pleasant lady  in no acute distress.  VITAL SIGNS:  Temperature 98.2, respirations 20, blood pressure 105/67.  NECK/HEENT:  Head is normocephalic, atraumatic.  ABDOMEN:  Soft.  EXTREMITIES:  Edema involving the ankles.  MENTATION:  The patient is awake, alert. She converses well.  Speech,  language, cognition are intact.  There is no dysarthria or aphasia  noted.  CRANIAL NERVES II-XII:  Pupils are 4 mm and reactive to light.  Extraocular movements intact. The patient's muscle strength is   symmetric.  Visual fields are intact.  Tongue is midline.  Uvula is  midline.  Shoulder shrugs are normal.  MOTOR:  Shows normal tone, bulk and stength of the upper extremities.  There is no pronator drift.  In regard to the legs, there is mild right  leg weakness proximally 4/5; distally, however, leg strength is good and  leg strength is normal on the left side.  Reflexes are preserved with  plantar reflexes upgoing on the left and downgoing on the right.  Coordination shows no dysmetria.  There are no tremors, rigidity or  bradykinesia noted.   LABORATORY DATA:  Random cortisol level 18.  CPK 57.  White blood cell  count 4.0, hemoglobin 10.9, platelet count 157,000.  TSH 2.4.  Total T4  level is 2.6.  AST slightly high at 45.  ALT 33.  Total bilirubin 0.8.   CLINICAL DATA:  Brain MRI scan shows atrophy and small vessel ischemic  disease.  There are no abnormalities noted. There is no acute stroke or  hemorrhage.  Scan was done with contrast and no enhancements noted.  Carotid Doppler's fine.   IMPRESSION:  Episodic spells of global weakness, abdominal discomfort,  diaphoresis and internal tremors.  The etiology is unclear at this time  but should include seizures given the episodic nature.  However, there  appears to be a clear postural component and one wonders whether these  are orthostatic conditions including orthostatic tremor.  Additional  possibilities include functional processes akin to irritable bowel  syndrome.   RECOMMENDATIONS:  1. EGD.  2. Repeat orthostatic parameters x3.  3. Trial of low dose benzodiazepines such as Xanax.   Thanks for this consultation.      Kofi A. Merlene Laughter, M.D.  Electronically Signed     KAD/MEDQ  D:  07/08/2007  T:  07/08/2007  Job:  [id number]

## 2011-05-01 NOTE — Discharge Summary (Signed)
[name], [name]                  ACCOUNT NO.:  [id number]   MEDICAL RECORD NO.:  [id number]          PATIENT TYPE:  INP   LOCATION:  A201                          FACILITY:  [facility]   PHYSICIAN:  Bonnielee Haff, MD     DATE OF BIRTH:  1921/07/14   DATE OF ADMISSION:  07/06/2007  DATE OF DISCHARGE:  07/24/2008LH                               DISCHARGE SUMMARY   HISTORY:  Please see H&P dictated by Dr. Anselmo Pickler for details  regarding the patient's presenting illness.   DISCHARGE DIAGNOSES:  1. Episodic weakness of unclear etiology.  2. History of anemia, likely iron deficiency, stable.  3. Hypokalemia, resolved.   BRIEF HOSPITAL COURSE:  This is an 77 year old Caucasian female with  medical problems that include history of breast cancer back in 2001,  history of irregular heart rate in the past and lymphedema of the left  upper extremity, who was in her usual state of health this past Friday,  when she took Prozac as prescribed by one of the neurologist at [facility].  On Friday, she started feeling very weak.  She felt shakiness.  She felt  like she could not get up from bed.  The patient felt this way for at  least 2 days and at that point decided to come into the hospital.  According to the patient, she has been having these symptoms since the  fall of last year, but they have been getting progressively more  frequently; they used to happen once every 2 weeks and now they are  happening almost once every other day.  Her PMD, Dr. Truett Mainland, apparently  has evaluated her and no etiology has been found.  She has also been  evaluated by a neurologist at [facility] for myasthenia gravis and apparently  that also has been ruled out.  In any case, the patient presented to the  hospital here.  She underwent blood work which showed mild hypokalemia,  otherwise completely unremarkable.  Her free T4 was slightly on the  lower side, though her TSH was within the normal range.  Her cardiac  markers were  negative.  Her hypokalemia was corrected quickly.  She was  a little bit anemic, but that also remained stable with no evidence for  any active bleeding.  The patient underwent an EEG, which was normal.  She underwent MRI of the brain which showed atrophy and small vessel  disease with no stroke.  She underwent a carotid Doppler which did not  show any stenosis.  She was also seen by Dr. Merlene Laughter, who ordered a B12  and  homocysteine level and a RPR, all of which are pending at this  time.  The patient was also seen by a cardiologist, who felt that there  was no cardiac etiology for her symptoms.   It is also felt that Prozac could have resulted in acute weakness over  this weekend, but does not explain her more chronic symptomatology.  Niacin was started 20 years ago; hence, that as an etiology is also  unlikely.   The patient has been ambulating  within her room using a walker.  She has  not had any kind of pain or dizziness with her symptoms.  Her  orthostatics were checked, which were normal.  In summary, she has had  somewhat of an extensive workup here in the hospital with no clear  etiology for her symptoms.  A 24-hour urine collection for HIAA is still  pending.  She did have joint abnormalities in the joints of her hand,  especially the PIP and the DIPs; I wonder if this patient has rheumatoid  arthritis.  She could have some other rheumatological issues which could  be causing some of her symptoms.  I would recommend that if  neurologically there is no etiology for this patient's symptoms, she be  referred to a rheumatologist for further workup and evaluation.   On the day of discharge, the patient is feeling quite well.  She states  she has not had any such episodes recently in the hospital.  She denies  any pain and denies any other complaints.  She has been ambulating  within her room with no difficulties.  She feels like she is ready to go  back home today.  Objective, her  vital signs are stable.  She has been  afebrile.  Orthostatics, as mentioned, have been normal.  Really, her  examination was unremarkable and no focal neurologic deficits were  found.   ASSESSMENT AND PLAN:  As per above.   MEDICATIONS AT HOME:  She may continue:  1. Iron 325 mg daily.  2. Niacin 500 mg b.i.d.   She has been asked to discontinue Prozac.   FOLLOWUP:  Follow up with Dr. Merlene Laughter in 2-3 weeks and Dr. Truett Mainland, August  14, as set up.   DIET:  Regular diet.   PHYSICAL ACTIVITY:  Continue to use a walker at home.   We will also set up home health to assess home safety for this  individual.   CONSULTATIONS:  [facility].  2. Dr. Merlene Laughter, neurologist.   IMAGING STUDY:  All have been discussed above, except for the chest x-  ray, which does not show any acute finding with suggestion for pulmonary  hyperexpansion.   COMMENT:  Please note above is preliminary until signed.   Total Time at Discharge: 15mns      GBonnielee Haff MD  Electronically Signed     GK/MEDQ  D:  07/10/2007  T:  07/10/2007  Job:  [id number]  cc:   KTrey SailorsA. DMerlene Laughter M.D.  Fax: [phone]  CNat Christen M.D.

## 2011-05-01 NOTE — Assessment & Plan Note (Signed)
NAMEMarland Kitchen  [name]                   CHART#:  [id number]   DATE:  09/10/2008                       DOB:  1921/03/09   PRIMARY CARE PHYSICIAN:  Nat Christen, MD.   PRIMARY GASTROENTEROLOGIST:  Bridgette Habermann, MD.   CHIEF COMPLAINT:  Followup CT results.   PROBLEM LIST:  1. She has chronic gastroesophageal reflux disease.  2. Weight loss, anorexia, and early satiety, which has resolved.  She      has had good adequate weight gain.  3. She had a borderline gastric emptying study.  4. She has history of left thyroid nodule and recent thyroid      ultrasound and she is scheduled for a followup ultrasound by Dr.      Truett Mainland in 3 months.  5. She has opacity in the left lower lung.  On last CT on 08/18/2008,      question as to whether this will need followup waiting for review      of CT by Dr. Thornton Papas and recommendations.  6. Spinal stenosis.  7. Stage I infiltrating ductal carcinoma in left breast, status post      partial mastectomy, radiation, and Arimidex.  8. Steatohepatitis, status post liver biopsy.  9. Bilateral hip replacements.  10.Cholecystectomy in 1965.  11.Osteoporosis.   SUBJECTIVE:  The patient is a pleasant 77 year old Caucasian female.  She is here for followup of CT scan results all of which are stable  except for a thyroid nodule, which was followed by thyroid ultrasound on  08/26/2008.  The nodule is hyperechoic and 2.3 cm.  There is also a  mixed echogenic nodule of 1.6 x 0.8 x 0.9 cm.  She is scheduled to have  a followup of thyroid ultrasound in 3 months by Dr. Truett Mainland.  Her weight is  up 10 pounds since she was last seen 6 months ago.  Her appetite is much  better.  Her heartburn and indigestion as well controlled on omeprazole  20 mg daily.  She occasionally has constipation for which she takes  stool softeners.  She is taking 5 tablets of FiberCon per day.  She  denies any rectal bleeding or melena.  She usually has a daily soft  brown bowel movement.   CURRENT MEDICATIONS:  See updated list from 09/10/2008.   ALLERGIES:  Ibuprofen, tape, and KY jelly.   OBJECTIVE:  VITAL SIGNS:  Weight of 148 pounds, height 64 inches,  temperature 98 degrees, blood pressure 120/78, and pulse 60.  GENERAL:  She is a pleasant, alert, oriented, and cooperative, elderly  Caucasian female, in no acute distress.  HEENT:  Sclerae clear.  She does have a few hyperpigmented areas on her  sclerae.  Conjunctiva is pink.  Oropharynx pink and moist without any  lesions.  CHEST:  Heart regular rate and rhythm.  Normal S1 and S2 without  murmurs, clicks, rubs, or gallops.  ABDOMEN:  Positive bowel sounds x4.  No bruits auscultated.  Soft,  nontender, and nondistended without palpable mass or hepatosplenomegaly.  No rebound, tenderness, or guarding.  EXTREMITIES:  She has changes of chronic arthritis of both hands.  She  has swelling of the DIP bilaterally.  She has radial or ulnar deviation.   ASSESSMENT:  The patient is an 77 year old Caucasian female with chronic  gastroesophageal reflux disease,  well controlled on proton pump  inhibitor.  She also has flushing.  She also has history of anorexia,  early satiety, and weight loss, which has resolved.  She has actually  gained 10 pounds in the last 6 months.  She had some abnormalities on CT  thyroid nodule being followed up by Dr. Truett Mainland.  She has history of  nonalcoholic steatohepatitis.  Overall, she is doing very well.   PLAN:  1. We will have Dr. Thornton Papas make suggestions regarding timing or any      further followup needed on chest or abdominal CT in concerning the      left lower lobe lung lesion.  2. Follow up with Dr. Truett Mainland regarding thyroid ultrasound.  3. Continue omeprazole 20 mg daily.  4. Continue FiberCon supplement.  5. Office visit in 1 year and we will call her if we do need to repeat      CT scans.  She also has an appointment with Dr. Tressie Stalker and she      can address this with him as  well.       [name], N.P.  Electronically Signed     [name], M.D.  Electronically Signed    [name]  D:  09/10/2008  T:  09/10/2008  Job:  [id number]   cc:   Nat Christen, M.D.  Gaston Islam. Tressie Stalker, MD

## 2011-05-01 NOTE — Group Therapy Note (Signed)
NAMEANAVICTORIA, [name] NO.:  [id number]   MEDICAL RECORD NO.:  [id number]          PATIENT TYPE:  INP   LOCATION:  A201                          FACILITY:  [facility]   PHYSICIAN:  Salem Caster, DO    DATE OF BIRTH:  1921/07/23   DATE OF PROCEDURE:  07/09/2007  DATE OF DISCHARGE:                                 PROGRESS NOTE   SUBJECTIVE:  [name] is an 77 year old Caucasian female who presented  to the emergency room with a complaint of weakness.  The patient states  this weakness has been ongoing since January, getting progressive and  worse daily.  The patient states, at this point, she needed to be  evaluated in the emergency room.  She stated, overall from her head to  her feet, she feels weak.  Symptoms are aggravated by activity and  walking.  It has been associated with some palpitations also.  This is a  patient of Dr. Truett Mainland and has been seen by a neurologist in [place] who  assessed her for myasthenia gravis which was negative.  Today the  patient states that overall she is doing well.  She has been going to  the bathroom with no problems, weakness, headache, shortness of breath,  chest pain.  The patient has not had really any rigorous activity since  she has been admitted but states, overall, she is feeling well.  Today,  she is doing well.  Has no acute distress.  The patient had a neurology  consult yesterday and it is felt that these are episodic spells of  global weakness with diaphoresis and internal tremors.  Etiology is  unclear at this time but should include seizures.  An EEG was ordered  and am awaiting her EEG to be performed.   OBJECTIVE:  VITAL SIGNS:  Temperature is 97.6, pulse 81, respirations  20, blood pressure 102/51.  The patient satting 99% on room air.  CARDIOVASCULAR:  Regular rate and rhythm.  No rubs, gallops, or murmurs.  ABDOMEN:  Is soft, nontender, nondistended.  No rigidity, guarding,  positive bowel sounds.  EXTREMITIES:  No  clubbing, cyanosis, or erythema.  NEUROLOGICALLY:  She is awake, alert, and oriented times 3.  Denies any  headache.  Cranial nerves II-XII are grossly intact.   LABS:  On 07/08/07, showed a sodium of 136, potassium 4.5, chloride 105,  CO2 26, glucose 85, BUN 16, creatinine 0.89.   RECENT RADIOLOGICAL STUDIES:  Last chest x-ray showed no acute findings.  Carotid Dopplers show minimal plaque formation with no evidence of  significant stenosis.  MRI of her brain showed atrophy and small vessel  disease.  No acute stroke or hemorrhage.  No abnormal enhancement  to  suggest metastatic disease.   ASSESSMENT AND PLAN:  For her weakness, Neurology has been recently  consulted.  EEG is pending at this time.  Also, Cardiology is following  the patient.  Echo showed no significant change from previous echo in  2007.  Seems to be within normal limits echo at this time.  For her  dyspnea.  Seems to be resolving.  Her chest x-ray was negative.  Pending  neurology workup, the patient probably be discharged home in the next 24-  48 hours.      Salem Caster, DO  Electronically Signed     SM/MEDQ  D:  07/09/2007  T:  07/10/2007  Job:  [id number]

## 2011-05-01 NOTE — H&P (Signed)
[name], [name]                  ACCOUNT NO.:  [id number]   MEDICAL RECORD NO.:  [id number]          PATIENT TYPE:  AMB   LOCATION:  DAY                           FACILITY:  [facility]   PHYSICIAN:  R. Garfield Cornea, M.D. DATE OF BIRTH:  November 26, 1921   DATE OF ADMISSION:  DATE OF DISCHARGE:  LH                              HISTORY & PHYSICAL   REFERRING PHYSICIAN:  Nat Christen, M.D.   REASON FOR CONSULTATION:  Upper abdominal symptoms, potential need for  EGD.   HISTORY OF PRESENT ILLNESS:  [name] is a pleasant, 77-year-  old, Caucasian female sent over through the courtesy of Dr. Nat Christen to further evaluate a good 10 month history of rather vague  symptoms [name] describes as intermittent episodes of fatigue and  generalized body weakness in association with upper abdominal  discomfort. She states when she becomes weak, she feels that her stomach  shrivels up. She has waxing and waning nausea and really does not  vomit. She at times experiences significant early satiety becoming full  after only eating a few bites of a meal and at other times feels that  her stomach empties too quickly.  She has 1-3 bowel movements daily,  has not had any melena or rectal bleeding.  She last weighed 144 pounds  with seen here in January 2007 and now weighs 138 pounds. She has had  some intermittent heartburn symptoms over the years and previously took  Omeprazole but is no longer on any agent for her reflux symptoms.  She  has vague intermittent esophageal dysphagia. Again she has these sudden  paroxysmal episodes of fatigue where she tells me it is actually  difficult for her to ambulate and go about her business. After she rests  for a while the symptoms subsides. She denies constipation.  She has not  had any fever or chills.  Symptoms have been persistent and possibly  insidiously worsening over the past couple of months.   According to the accompanying records provided  from Dr. Deirdre Pippins office,  she has seen Dr. Rolena Infante, a neurologist, recently and she is actually  planning to be seen again tomorrow by him. A 24-hour Holter monitor has  been done.  She has had evaluation for pheochromocytoma. Her urinary  cortisol levels come back okay at 17.9. She had a CT scan done June 10, 2007 which demonstrated nonspecific findings, tiny bilateral lung  nodules, a left adrenal lung nodule, a small nonspecific lesion in the  right lobe of the liver, atherosclerotic changes of the aorta without  aneurysm, no significant mesenteric vasculature compromise.  The stomach  was noted to be fluid filled. She had sigmoid diverticula without any  evidence of any inflammatory neoplastic process. It is notable this lady  takes niacin every day and has done so for the better part of 20 years  and really has not had any trouble taking this agent. She had previously  been on Fosamax but this has been stopped without any change in her  symptoms. She previously took Voltaren which  was also stopped.  She does  take an 81 mg aspirin daily but denies any other nonsteroidal agent. She  does have occasional retroxiphoid chest pain associated with her  episodes of weakness. It is notable she was seen in consultation by Dr.  Laural Golden back in January 2007 for mildly elevated transaminases and right  upper quadrant abdominal pain.  Ultimately she underwent an EGD on  January 01, 2006 which demonstrated some gastric polyps. These appeared  to be benign and biopsies confirmed benignity. H. pylori serologies were  negative at that time. It was recommended she continue omeprazole which  she has subsequently come off of as outlined above. At that time, she  was hemoccult negative. She had a mild anemia with a hemoglobin of 11.0  and 33.9. Previously she had mildly elevated aminotransferases, the  etiology of which appears to have been not well defined however, repeat  hepatic profile in January 2007  came back completely normal through this  office.   There is no history of diabetes and [name] tells me she has checked  her blood sugars __________ related to these episodes of fatigue and she  has not had any documented hypoglycemia. Her gallbladder was removed  many, many years ago by Dr. Newt Minion. Ultrasound of her right  upper quadrant back in October 2006 demonstrated a CBD of 7-8 mm and a  nonobstructing left lower pole kidney stone.   PAST MEDICAL HISTORY:  Significant for left breast carcinoma status post  lumpectomy and radiation therapy. She has had a history of right benign  breast lesions biopsied. She is status post bilateral hip replacement  therapy with the left being done in May 2007 status post partial  hysterectomy, cholecystectomy, cholelithiasis some 30 years ago,  hypercholesterolemia, arthritis of diverticulosis, osteoarthritis. A  colonoscopy done in 2005 by Dr. Laural Golden demonstrated external hemorrhoids  and left-sided diverticula. She has chronic right shoulder pain,  scoliosis, osteoarthritis, chronic lymphedema left upper extremity.   CURRENT MEDICATIONS:  1. Multivitamin once daily.  2. Calcium 500/600 vitamin D 2 tablets daily.  3. Niacin 1 gram daily.  4. Folic acid 40 mg daily.  5. Fiber tablets daily.  6. Aspirin 81 mg daily.  7. Iron 325 mg daily.  8. Zometa IV.   ALLERGIES:  IBUPROFEN, TOLECTIN, K-Y JELLY __________, K-Y JELLY TAPE.   FAMILY HISTORY:  No history of chronic liver disease, colorectal  neoplasia or other gastrointestinal illness.  Mother died age 52 and  father died at age 62. One brother who died with leukemia previously.   SOCIAL HISTORY:  The patient has been married for 59 years.  She has two  grown, healthy, adopted children.  She is a housewife.  There is no  history of alcohol or tobacco use.   REVIEW OF SYSTEMS:  She has had some modest weight loss over the past  one and half years, vague intermittent esophageal  dysphagia, early  satiety and vague retroxiphoid epigastric pain.  She denies  constipation, diarrhea, melena or rectal bleeding. She has not had any  recent palpitations, dyspnea on exertion, fever, chills.   PHYSICAL EXAMINATION:  GENERAL:  An 77 year old lady resting  comfortably.  VITAL SIGNS:  Weight 138, height 5 foot 4, temperature 97.9, BP 11/80,  pulse 80.  SKIN:  Warm and dry.  There is no jaundice or stigmata of chronic liver  disease.  HEENT:  No scleral icterus.  Conjunctivae pink.  Oral cavity, dentition  in good state of  repair.  No lesions.  JVD is not prominent.  CHEST:  Lungs are clear to auscultation.  CARDIAC:  Regular rate and rhythm without murmur, gallop or rub.  BREAST:  Exam is deferred.  ABDOMEN:  Nondistended, positive bowel sounds, right upper quadrant  scar.  There is no bruits.  The abdomen is soft.  She does have some  retroxiphoid tenderness to localize palpations, questionable area of  fullness there but I do not appreciate any out and out mass or  hepatosplenomegaly.  EXTREMITIES:  No edema.  She has an Ace wrap on her left arm.   IMPRESSION:  [name] is a very pleasant, 77 year old lady with a rather  peculiar constellation of non-GI and GI symptoms. I spent nearly 30  minutes with her trying to better understand her complaints.   I am somewhat hard pressed to put her systemic complaints of paroxysms  of fatigue and weakness together with her GI complaints which are also  rather somewhat vague although do gather a history of vague esophageal  dysphagia and early satiety.  She was having some right upper quadrant  pain a year and a half ago for which Dr. Laural Golden saw this nice lady.  However that symptom has subsided.   Some of her upper GI tract symptoms could be construed as secondary to  dumping syndrome but I doubt this is the case. Delayed gastric emptying  or some new intramucosal upper GI process certainly remains in the  differential and  that possibility needs to be evaluated further. She has  a history of mildly elevated transaminases which also resolve without  being well-defined previously.   If she had just started on niacin and/or Fosamax these would certainly  be suspect agents as producing some upper GI tract symptoms, but since  she had been on niacin for the better part of two decades without any  problems and the Fosamax has been stopped without any change in her  symptoms  I doubt there are any medication issues at play here.   RECOMMENDATIONS:  I feel it would be very reasonable to go ahead and  repeat an EGD at this time.  I have discussed my reasoning behind this  approach with [name]. The potential risks, benefits and alternatives  have been reviewed, her questions were answered and she is agreeable.  Depending on what is found at repeat EGD, the next step may be to obtain  a solid phase gastric emptying study to evaluate her for gastroparesis.   Further recommendations to follow pending endoscopic evaluation.   I would like to thank Dr. Nat Christen for allowing me to see this  very nice lady with interesting symptoms.      Bridgette Habermann, M.D.  Electronically Signed     RMR/MEDQ  D:  06/25/2007  T:  06/26/2007  Job:  [id number]   cc:   Nat Christen, M.D.

## 2011-05-01 NOTE — H&P (Signed)
[name], [name] NO.:  [id number]   MEDICAL RECORD NO.:  [id number]          PATIENT TYPE:  INP   LOCATION:  A201                          FACILITY:  [facility]   PHYSICIAN:  Anselmo Pickler, DO    DATE OF BIRTH:  1921/11/25   DATE OF ADMISSION:  07/06/2007  DATE OF DISCHARGE:  LH                              HISTORY & PHYSICAL   The patient is an 77 year old Caucasian female who presented to the  [facility] Emergency Room with a complaint of weakness.  The  patient admits that weakness has been ongoing since January, getting  progressive and worse, at its worst today.  At that point in time, she  decided to be evaluated in the emergency room.  She stated that it is  overall from her head to her feet.  It is weakness where she cannot  feel.  The symptoms are aggravated by activity and walking to the point  that she cannot even wash 1-2 dishes at the sink.  It has also been  associated with palpitations and she stated that she has to deep breath  in order for her to catch her breath.  At this point in time, she is a  patient of Dr. Nat Christen.  At this time the patient does admit to  being seen by a neurologist in [place], that tested her for a myasthenia  gravis and the results of that test were negative.  She does not recall  having an MRI done or an EEG done regarding any of these symptoms at  this point in time.  The patient is the historian.   PAST MEDICAL HISTORY:  1. Acid reflux.  2. Breast cancer.  3. Irregular heart rate.  4. Lymph edema.  5. Osteoporosis.  6. Nocturia.  7. Fatigue and weakness.   SURGICAL HISTORY:  1. Right hip replacement in 2005.  2. Left hip replacement in 2007.  3. She had a lumpectomy in 2001, with subsequent radiation therapy for      breast cancer.   SOCIAL HISTORY:  She is a nonsmoker but lives with a smoker.  Denies any  alcohol use or any drug use.  She lives at home with her husband [place]  f[place].  She also has  2 adopted children who are men.   FAMILY HISTORY:  Significant for coronary artery disease and  hypertension.   ALLERGIES:  1. IBUPROFEN which she admits after taking it for up to 10 days she      develops a rash.  2. TOLECTIN, a rash as well.  3. ANY KIND OF SURGICAL TAPE, she develops a rash.   She is currently only on a few medications which include:  1. Iron 325 mg p.o. once a day.  2. Niacin 550 b.i.d.  3. Prozac did not have the dose available at this point in time.   CONSTITUTIONAL:  Eyes are negative for any kind of eye pain or diplopia.  She does admit to having cataract surgery, so she has a history of  cataracts with lens replacement.  ENT:  Negative for ear pain, hearing  loss, or hoarseness, also negative for chronic dizziness.  CARDIOVASCULAR:  Positive for palpitations and dyspnea.  Negative for  chest pain, bradycardia, or claudication.  RESPIRATORY:  Positive for  hyperventilation.  Negative for cough or wheezing.  GASTROINTESTINAL:  Positive for appetite changes and a little bit of nausea and GERD.  GU:  Positive for nocturia.  Negative for dysuria, urgency, back pain, and  frequency.  MUSCULOSKELETAL:  Negative for arthralgias.  Positive for  arthritis.  Negative for back pain or myalgias.  SKIN:  Negative for  rashes, abrasions, or blistering.  NEUROLOGIC:  Positive for weakness.  PSYCHIATRIC:  Negative for anxiety or depression.  METABOLIC:  Negative  for excessive thirst or hot or cold intolerances.  HEMATOLOGIC:  Negative for anemia.   PHYSICAL EXAMINATION:  VITAL SIGNS:  Blood pressure 134/69, pulse rate  87, respirations 20, temperature 98.4, sating on O2 of room air.  GENERAL:  This is an 78 year old Caucasian female who is well-developed,  well-nourished, in no acute distress.  She is pleasant and appropriate  with appropriate affect.  SKIN:  Good turgor.  Good texture.  Warm.  No tattoos.  Hair is normal  distribution.  No nail pitting or stippling.   HEAD:  Normocephalic, atraumatic.  EYES:  Equal and reactive to light bilaterally with the right pupil  slightly larger than the left.  EARS:  Symmetrical.  No drainage or discharge noted.  TM's are  visualized.  NOSE:  Symmetrical and no discharge noted.  MOUTH/THROAT:  Teeth are in fair repair.  No erythema or exudate noted.  NECK:  No masses.  Full range of motion.  No tracheal deviation.  Thyroid was within normal limits.  BREASTS:  Positive lumpectomy scar on the left side.  No tenderness or  masses.  HEART:  Regular rate and rhythm.  No gallops, rubs, or clicks noted.  LUNGS:  Clear to auscultation bilaterally.  No wheezes, crackles, or  rhonchi.  ABDOMEN:  Soft, nontender, nondistended.  No rebound tenderness or  masses or guarding noted.  Spleen and liver are within normal limits.  GU:  External genitalia is within normal limits.  MUSCULOSKELETAL:  No muscular atrophy.  Good strength in upper and lower  extremities.  Positive +2 pitting edema in bilateral lower legs.  LYMPHATIC:  No cervical or axillary lymphadenopathy.  NEUROLOGIC:  Cranial nerves II-XII grossly intact.   LABORATORY:  Done in the ED include:  A CMP:  Sodium 131, potassium 3.5,  chloride 98, carbon dioxide 24, glucose 96, BUN 13, creatinine 1.1, and  she also had a CBC:  White count 6.4, hemoglobin 12, hematocrit 35.1,  platelets 210.  She had a chest x-ray done that showed no acute changes  and an EKG showed normal sinus rhythm, premature atrial contractions  with an axis that was normal, P QRS was normal, ST was normal, rate was  96 beats per minute.  She also had cardiac markers done myoglobin 79, CK-  MB 1.2, troponin less than 0.05.   At that point in time, the patient was admitted to the service of [facility] for weakness and exertional dyspnea.   ASSESSMENT:  1. Exertional dyspnea.  2. Weakness.  3. Dizziness.  4. History of arrhythmia.  5. History of breast cancer.   PLAN:  1. We will admit  the patient to the service of [facility].  2. We will have a 2D echo done and a carotid Doppler  to assess      potential blockages.  3. Due to the patient's history of breast cancer and this progressive      weakness, we will also have an MRI of the brain done and depending      on possible management, we will consult neurology if needed.  4. We will continue with DVT and GI prophylaxis.  5. We will continue to monitor the patient.      Anselmo Pickler, DO  Electronically Signed     CB/MEDQ  D:  07/06/2007  T:  07/06/2007  Job:  [id number]

## 2011-05-01 NOTE — Group Therapy Note (Signed)
[name], [name]                  ACCOUNT NO.:  [id number]   MEDICAL RECORD NO.:  [id number]          PATIENT TYPE:  INP   LOCATION:  A201                          FACILITY:  [facility]   PHYSICIAN:  Kofi A. Merlene Laughter, M.D. DATE OF BIRTH:  March 03, 1921   DATE OF PROCEDURE:  07/09/2007  DATE OF DISCHARGE:                                 PROGRESS NOTE   The patient reports that she has not had any of the internal weak spell.  She, however, has had one spell where she developed abdominal  discomfort.  This happened after taking the 500 mg Niacin tablet.  It  resolved after drinking lots of fluids.  She continues to have muscle  weakness, however.  She has been up ambulating and going to the  bathroom, however.  Her orthostatic's lying 104/53, pulse 81; standing  123/63, pulse 95; repeat lying down blood pressure 96/54, pulse 76;  standing 126/64, pulse 90; the latest blood pressure 123/63, pulse 82  lying; standing blood pressure 120/73, pulse 106.  The patient is able  to ambulate and has somewhat of a wide, antalgic gait and requires  holding on.  EEG was done and reviewed and essentially is normal.   ASSESSMENT:  Episodic symptoms of unclear etiology likely worsened with  Prozac.  Will do additional blood testing for B12 deficiency, and also  an RPR.  She is to follow up in our clinic in the next two to three  weeks.      Kofi A. Merlene Laughter, M.D.  Electronically Signed     KAD/MEDQ  D:  07/09/2007  T:  07/09/2007  Job:  [id number]

## 2011-05-01 NOTE — Procedures (Signed)
[name], [name]                  ACCOUNT NO.:  [id number]   MEDICAL RECORD NO.:  [id number]          PATIENT TYPE:  INP   LOCATION:  A201                          FACILITY:  [facility]   PHYSICIAN:  Kofi A. Merlene Laughter, M.D. DATE OF BIRTH:  06-14-1921   DATE OF PROCEDURE:  07/09/2007  DATE OF DISCHARGE:                              EEG INTERPRETATION   This is an 77 year old lady who has episodic spells suspicious for  nonconvulsive seizure activity.   MEDICATIONS:  Prozac, niacin, aspirin, Benadryl.   ANALYSIS:  This is a 16-channel recording that was conducted for  approximately 22 minutes.  There is a well-formed posterior rhythm of 9  Hz which attenuates with eye opening.  Beta activity is noted in the  frontal areas.  Awake activity is recording no sleep activity as  observed.  Photic stimulation is conducted without changes in the  background activity.  There is no focal slowing or lateralized slowing  or epileptiform activity observed.   IMPRESSION:  Normal recording of the awake state, however, a single  recording does not rule out epileptic seizures.  If clinically  indicated, a sleep deprived recording could be useful.      Kofi A. Merlene Laughter, M.D.  Electronically Signed     KAD/MEDQ  D:  07/09/2007  T:  07/09/2007  Job:  [id number]

## 2011-05-01 NOTE — Letter (Signed)
January 24, 2010   Keota Luan Pulling, MD  [address]  [place], [place] [address]   Re:  [name], KEARL                  DOB:  12/27/1920   Dear [name],   I appreciate the opportunity of seeing the patient.  This patient had a  previous left breast cancer, which was treated with excision and  radiation.  There is a question about a year or 2 years ago when she had  a PET scan for liver lesions and a liver biopsy, which turned out to be  negative.  She now comes with an enlarging left lower lobe lesion that  is increased in size since October from 7 x 12 mm that is now up to 16 x  8 mm.  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  She is a nonsmoker but has been exposed to secondhand smoke.  She has had no fever, chills, excessive sputum.  No hemoptysis.   MEDICATIONS:  Atenolol 12.5 daily, niacin 500 mg a day, aspirin,  omeprazole 20 mg daily, iron 50 mg daily and Zometa that she has been on  twice.   ALLERGIES:  She is allergic to tape, causes skin irritation.   She had no fever, chills or excessive sputum.  She has hypertension and  hypercholesterolemia.   FAMILY HISTORY:  Noncontributory.   SOCIAL HISTORY:  She is married, 2 adopted children.  She worked as  income Counselling psychologist.  She does not smoke, does not drink alcohol on a  regular basis.  Her husband is a smoker.   REVIEW OF SYSTEMS:  CONSTITUTIONAL:  She is 5 feet, 448 pounds.  GENERAL:  Weight has been stable.  CARDIAC:  She has had some atrial arrhythmias.  No angina.  PULMONARY:  No hemoptysis.  GI:  Hiatal hernia.  GU:  Frequent urination.  VASCULAR:  She has had no claudication, DVT or TIAs but has left arm and  lymphedema secondary to her surgery.  NEUROLOGICAL:  No dizziness  headaches, blackouts, seizures.  MUSCULOSKELETAL:  She has had both hips replaced and walks with 2 canes.  PSYCHIATRIC:  No depression and nervousness.  EYE/ENT:  No change in her eyesight or hearing.  HEMATOLOGIC:  No problems with bleeding, clotting disorders or anemia.   PHYSICAL EXAMINATION:  General:  She is a frail looking 77 year old  Caucasian female in no acute distress.  Her blood pressure is 132/68,  pulse 65, respirations 18, sats were 96%.  Head, Eyes, Ears, Nose and  Throat:  Unremarkable.  Neck:  Supple without thyromegaly.  There is no  supraclavicular or axillary adenopathy.  Chest:  Clear to auscultation  and percussion.  Heart:  Regular sinus rhythm.  No murmurs.  Abdomen:  Soft.  There is no hepatosplenomegaly.  Extremities:  Pulses 2+.  There  is no clubbing or edema.  Neurologic:  She is alert, oriented x3.  Extremities:  She has got bilateral hip replacements.  She said walks  with a cane.   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  Plan to get a PET scan, I want to just  to see what the standard uptake value is, it is probably going to be  negative, but I want to be sure that it is negative on PET scan, and  then we have to consider doing a needle biopsy  or waiting 3-4 months  until we have electromagnetic navigation bronchoscopy available.  I will  see her back again after PET scan and let you know what my thoughts are.    Sincerely,   Nicanor Alcon, M.D.  Electronically Signed   DPB/MEDQ  D:  01/24/2010  T:  01/25/2010  Job:  [id number]   cc:   Unk Lightning, MD

## 2011-05-01 NOTE — Assessment & Plan Note (Signed)
NAMEJAWANNA, [name]                   CHART#:  [id number]   DATE:  01/06/2008                       DOB:  10-Feb-1921   I received a call from Dr. Tressie Stalker last month.  He asked me to see [name] back in consultation, reference opinion on liver lesion and weight  loss.  [name] is an 77 year old lady with a history of breast cancer,  who has sustained significant weight loss over the past several months.  She is having some early satiety and didn't like any food, particularly  her own cooking.   Workup on this end included an EGD which revealed a small hiatal hernia  and multiple fundal gland polyps (proven to be benign with biopsy).  Saw  the patient's gastric emptying study demonstrated borderline prolonged  gastric emptying with 23% of gastric contents remaining in 2 hours.  We  started her on some Reglan at a very low dose, 2.5 mg a.c. for 1 month.  This was associated with transient improvement and early satiety, and  anorexia.  She previously had been taking Fosamax and Voltaren, but  these agents have been stopped.  Colonoscopy done by Dr. Hans Eden back in  2005 demonstrated only left-sided diverticula.   More recently, she underwent CT scan orchestrated by Dr. Tressie Stalker to  further evaluate mildly elevated liver enzymes, weight loss, and given  her prior history of carcinoma.  CT demonstrated some indeterminate  liver lesions.  She ultimately underwent an ultrasound-guided needle  biopsy, left hepatic lobe mass, which revealed steatosis and some mild  atypia, but no evidence of a carcinoma.  Her constitutional symptoms of  weight loss, anorexia continued.  MRI was performed on December 15, 2007.  This demonstrated post biopsy changes, left hepatic lobe lesion  in the area of the falciform ligament.   I discussed and reviewed the recent imaging studies with Dr. Earle Gell  on 2 different occasions over the holidays and it was felt that taken in  totality that there do not appear  to be any concerning liver lesions on  CT.  However, it was recommended a 3 month interval follow up liver CT  be performed.   [name] tells me she now is feeling overall much better.  She has been  treated for depression by Dr. Truett Mainland and seemed much better.  She now is  able to eat her own food and eats much more than she has had previously.  She has not had any melena or hematochezia.  Early satiety is better.  No nausea or vomiting.  She is taking omeprazole 20 mg orally daily.  She weighs in at 130 pounds today, but did weight 140 pounds back on  August 19, 2007 through our office.  She feels that overall she is  doing very well and has no GI complaints today.   PAST MEDICAL HISTORY:  Notable for breast cancer, status post lumpectomy  and radiation therapy previously.  History of benign breast mass biopsy.  History of bilateral hip replacements, partial hysterectomy,  cholecystectomy.  History of osteoarthritis.  Colonoscopy in 2007  demonstrated diverticulosis.  She has lymphedema, left upper extremity.  Osteoporosis.   CURRENT MEDICATIONS:  1. Multivitamin daily.  2. Calcium 500/600 vitamin D daily.  3. Niacin 500 mg 2 tablets daily.  4. Fiber tablets, 5 daily.  5. ASA 81 mg daily.  6. Iron 325 mg daily.  7. Zometa IV q.6 months.  8. Omeprazole 20 mg daily.  9. Atenolol 1/2 of a 12.5 mg tablet daily.   FAMILY HISTORY:  No history of chronic GI or liver illness.  One brother  had leukemia.   SOCIAL HISTORY:  The patient has been married for 60 years.  Two grown,  healthy adopted children.  She is a housewife.  No alcohol or tobacco.   REVIEW OF SYSTEMS:  Weight loss as noted above.  Has not had any chest  pain or dyspnea on exertion.  No fever or chills.  Otherwise as in  history of present illness.   PHYSICAL EXAMINATION:  Reveals a frail, 77 year old lady resting  comfortably.  Weight 130.  Height 5 feet 4 inches.  Temp 98.  BP 120/70.  Pulse of 72.  SKIN:  Warm  and dry.  There is no jaundice or continued stigmata of  chronic liver disease.  CHEST/LUNGS:  Clear to auscultation.  CORE:  Regular rate and rhythm, without murmur, gallop, rub.  ABDOMEN:  Nondistended.  Positive bowel sounds.  Soft, nontender,  without appreciable mass or organomegaly.  EXTREMITIES:  No lower extremity edema.   IMPRESSION:  [name] is a very pleasant 77 year old lady with a  protracted episode of anorexia, early satiety, weight loss, and  borderline delayed gastric emptying on a recent GES study.  Was found  likely to have a possible hepatic mass on CT, who has undergone a fairly  extensive evaluation.   After reviewing the multiple studies and lengthy discussion with Dr.  Kris Hartmann, it appears that the findings on CT and MRI are not suggestive of  a mass, but more like technical variations in scanning, technical  variations in technique, and possibly some focal heterogeneous changes  related to fatty infiltration.  Findings of the recent biopsy are quite  reassuring and the fact that [name] appears to be clinically turning  around is most reassuring to me as well.   RECOMMENDATIONS:  1. At this point I have told [name] that she is just to carry on and      continue to eat and focus on multiple smaller meals daily than any      1 large meal.  She is to continue her omeprazole.  2. Will go ahead and check an hepatic profile now to see how the      numbers look.  3. By all means, go with Dr. Huel Coventry recommendations of repeating a CT      of the liver at 3 months, which will be mid-March of this year.      Will plan to see this lady back shortly after her followup CT and      p.r.n.   I would like to thank Dr. Tressie Stalker for allowing me to see this nice  lady once again.       Bridgette Habermann, M.D.  Electronically Signed     RMR/MEDQ  D:  01/07/2008  T:  01/07/2008  Job:  [id number]

## 2011-05-01 NOTE — Assessment & Plan Note (Signed)
NAMEMarland Kitchen  [name], [name]                   CHART#:  [id number]   DATE:  08/19/2007                       DOB:  Aug 05, 1921   PROBLEM:  Followup of nausea, fullness and early satiety.   SUBJECTIVE:  [name] was last seen by me at the hospital on June 30, 2007, at which time she underwent an EGD to further evaluate the vague  upper GI symptoms.  She was found to have a normal esophagus, small  hiatal hernia and multiple laryngeal gland-type polyps (biopsy proven to  be benign).  We did a solid-phase gastric emptying study on her which  was borderline-prolonged with 23% of the gastric contents remaining in  the stomach at 2 hours.  She was started on low-dose Reglan 2.5 mg a.c.  for 1 month; this was associated essentially with near resolution in her  symptoms; she has been off that regimen for a good 3-4 weeks now and  continues to do very well.  She has gotten into a habit of eating 4-5  smaller meals daily rather than 3 main meals and this has served her  very well and the vague upper abdominal symptoms she was having have  settled down.  She is not taking omeprazole, Ditropan or Fosamax at this  time.  In addition, she has also stopped Voltaren.  She continues to  take an iron supplement and aspirin 81 mg daily.  She feels she is doing  overall very well.  It is notable that Dr. Laural Golden did a colonoscopy on  this nice lady back in 2005 and found some hemorrhoids and some left-  sided diverticula only.   CURRENT MEDICATIONS:  See updated list.   ALLERGIES:  IBUPROFEN, K-Y JELLY, TAPE.   PHYSICAL EXAMINATION:  GENERAL:  On exam today, looks well.  VITAL SIGNS:  Weight 140, up 2 pounds, height 5 feet 4 inches.  Temperature 97.8, blood pressure 120/72, pulse 80.  ABDOMEN:  Flat.  Positive bowel sounds.  No succussion splash.  Abdomen  is soft and nontender, without appreciable mass or organomegaly.   ASSESSMENT:  She has some delay in gastric emptying, currently  asymptomatic.  Her  nonspecific gastrointestinal symptoms with which she  presented in July have settled down.  At this point, I see no reason  necessarily to get her back on Reglan, which is associated with  significant side-effects.  I told her she might have a low threshold for  getting back on omeprazole should she develop any upper abdominal  fullness or reflux symptoms, but right now she could make that an as-  needed medication.  Unless something comes up, I will leave followup  here open-ended.  I told her that Dr. Truett Mainland could let me know should she  need to be  seen here again in the future and I also told [name] she could  certainly call me directly should any issues come up.       Bridgette Habermann, M.D.  Electronically Signed     RMR/MEDQ  D:  08/19/2007  T:  08/20/2007  Job:  [id number]   cc:   Nat Christen, M.D.

## 2011-05-01 NOTE — Consult Note (Signed)
[name], [name]                  ACCOUNT NO.:  [id number]   MEDICAL RECORD NO.:  [id number]          PATIENT TYPE:  INP   LOCATION:  A201                          FACILITY:  [facility]   PHYSICIAN:  Cristopher Estimable. Lattie Haw, MD, FACCDATE OF BIRTH:  1921/06/04   DATE OF CONSULTATION:  07/07/2007  DATE OF DISCHARGE:                                 CONSULTATION   REFERRING PHYSICIAN:  Dr. Delphina Cahill.   PRIMARY CARE PHYSICIAN:  Dr. Truett Mainland.   PRIMARY CARDIOLOGIST:  Previously Dr. Verl Blalock.   HISTORY OF PRESENT ILLNESS:  An 77 year old woman admitted to the  hospital with profound weakness.  [name] has no significant past  cardiac history.  She has been evaluated by Dr. Verl Blalock in the past for  weakness.  She cannot recall the details of this assessment, but she was  not found to have any significant cardiac issues.  She describes  approximately one year history of episodic weakness.  This is not  related to exertion or change in body position.  She experiences hours  or days when she feels lassitude and inability to function properly.  This was infrequent in the past but has become progressively more  frequent.  Her symptoms were so severe, and her workup was so prolonged  that she elected to come to the emergency department and was referred  for admission.   She has been extensively evaluated by Dr. Truett Mainland, Dr. Tressie Stalker - her  oncologist, and a neurologist in [place].  Apparently, no significant  etiology for her symptoms have been found.  A trial of Prozac was  recently begun.  The patient experienced some dizziness with initial  dosing but has not noted improvement after a few days of that  medication.   She has had hypoglycemia in the past and likens these episodes to those;  however, she has used a blood glucose meter at home and documented  normal CBGs during symptoms.  She has had a Holter monitor that was  unrevealing.  An echocardiogram was essentially normal.  She has had a  CT of the abdomen  and pelvis that showed no acute abnormalities.  She  has had some nodular densities in the right lung that sounds as if they  are unchanged.  Mild atherosclerotic changes in the abdominal aorta were  noted.  She has some diverticular disease without symptoms.  The patient  describes her symptoms as severe with sudden onset and gradual  resolution with rest.  She has some mild diaphoresis and perhaps some  dyspnea associated with her weakness.   PAST MEDICAL HISTORY:  Is extensive.  She underwent a right mastectomy  in 2001 for carcinoma of the breast.  She has undergone bilateral hip  replacement, most recently on the left approximately a year ago.  She  subsequently was doing well in rehabilitation, but now her weakness is  preventing adequate improvement.  After her surgery, she was admitted to  a rehab facility and then readmitted to the hospital with a right lower  lobe pneumonia and sepsis associated with hypoxic respiratory failure.  A  chest x-ray at that time was interpreted as showing pulmonary edema,  but no specific cardiology issues were raised.  She underwent lumpectomy  and radiation therapy on the left for carcinoma of the breast.   The patient has had a history of GERD.  Recent upper endoscopy showed  polyps in the stomach with mild erosive gastritis.  She has had chronic  lymphedema of the left upper extremity post mastectomy.  She has  previously undergone cataract extraction, cholecystectomy, appendectomy  and partial hysterectomy.  She has also had a number of excisional  biopsies from the right breast for benign disease.  There is a history  of hyperlipidemia, scoliosis and osteoarthritis.   AN ALLERGY TO IBUPROFEN AND TOLECTIN IS DESCRIBED.   RECENT MEDICATIONS:  1. Iron 325 mg daily.  2. Niacin 500 mg b.i.d.  3. Prozac.  4. Aspirin 81 mg daily.  5. Zometa IV once a year.   SOCIAL HISTORY:  Married and lives locally; no use of tobacco products  or  alcohol.   FAMILY HISTORY:  Positive for coronary disease and hypertension.   REVIEW OF SYSTEMS:  Is notable for the need for corrective lenses,  bilateral hearing impairment with bilateral hearing aids, full dentures  and modest weight loss over the past year or two.  She has some early  satiety and intermittent epigastric discomfort.   PHYSICAL EXAMINATION:  On exam, pleasant pale older woman who is quite  mentally sharp and in no acute distress.  The temperature is 98.1, heart  rate 84 and regular, respirations 20, blood pressure 130/65.  NECK:  No jugular venous distension; normal carotid upstrokes without  bruits.  ENDOCRINE:  No thyromegaly.  HEMATOPOIETIC:  No adenopathy.  HEENT:  EOMs full; pupils equal, round, reactive to light; normal oral  mucosa.  LUNGS:  Clear.  CARDIAC:  Normal first and second heart sounds; modest basilar systolic  murmur; normal PMI.  ABDOMEN:  Normal bowel sounds; soft and nontender; no masses; no  organomegaly; aortic pulsation not palpable.  EXTREMITIES:  Trace edema; distal pulses intact.  NEUROMUSCULAR:  Symmetric strength and tone; normal cranial nerves.  PSYCHIATRIC:  Alert and oriented; normal affect.  SKIN:  No significant lesions.   LABORATORY:  This admission includes mild hypokalemia with initial  potassium of 3.5 falling to 3.3.  Hemoglobin was fairly normal at 12 on  admission, but a subsequent measurement is 9.5.  Platelets have  decreased from 210 to 137.  Her cardiac markers are negative.  TSH is  normal.   Other recent studies available for review including a CT scan of the  abdomen which is described above, a CT scan of the chest in 2007 showing  right lower lobe nodules, borderline hilar adenopathy and pulmonary  edema.  She has had an echocardiogram that was unremarkable and a Holter  monitor that was unremarkable including with one episode of her typical  symptoms.   IMPRESSION:  [name] has nonspecific symptoms with an  extensive  negative workup to date.  At this point, the diagnosis of chronic  fatigue syndrome will need to be entertained.  A cortisol level will be  obtained.  A 24-hour urine will be collected for measurement of HIAA.  If she has not had testing for pheochromocytoma as suggested in her  previous neurologic workup, these tests could be added as well.  We will  check blood pressures for orthostatic changes.  While her symptoms are  somewhat exertional and could reflect coronary disease,  I think this is  unlikely and will defer stress testing for the time being.  An  echocardiogram is pending.  Monitoring is being performed.  A repeat CBC  will be obtained in light of the dramatic change over the first day of  hospitalization.  This will require follow-up.  I appreciate the request  for consultation and will be happy to assist in the diagnostic  evaluation of this nice woman.      Cristopher Estimable. Lattie Haw, MD, [facility]  Electronically Signed     RMR/MEDQ  D:  07/07/2007  T:  07/07/2007  Job:  [id number]

## 2011-05-01 NOTE — Procedures (Signed)
[name], [name]                  ACCOUNT NO.:  [id number]   MEDICAL RECORD NO.:  [id number]          PATIENT TYPE:  INP   LOCATION:  A201                          FACILITY:  [facility]   PHYSICIAN:  Cristopher Estimable. Lattie Haw, MD, FACCDATE OF BIRTH:  February 28, 1921   DATE OF PROCEDURE:  DATE OF DISCHARGE:                                ECHOCARDIOGRAM   REFERRING:  [facility].   CLINICAL DATA:  An 77 year old woman with dyspnea.   M-mode:  Aorta 3.3, left atrium 2.2, septum 0.9, posterior wall 1.0, LV  diastole 3.7, LV systole 3.0.   1. Technically suboptimal but adequate echocardiographic study.  2. Normal left atrium, right atrium and right ventricle.  3. Normal diameter of the proximal ascending aorta; mild calcification      of the wall and annulus.  4. Mild sclerosis of the aortic valve with normal function.  5. Mild mitral valve thickening; mild annular calcification.  6. Normal tricuspid valve.  7. Pulmonic valve and proximal pulmonary artery not adequately imaged.  8. Normal internal dimension, wall thickness, regional and global      function of the left ventricle.  9. Normal IVC.  10.Comparison with prior study of May 06, 2006:  No significant      interval change.      Cristopher Estimable. Lattie Haw, MD, [facility]  Electronically Signed     RMR/MEDQ  D:  07/07/2007  T:  07/08/2007  Job:  [id number]

## 2011-05-01 NOTE — Assessment & Plan Note (Signed)
[name] CARDIOLOGY OFFICE NOTE   NAME:GUNNCandace, [name]                         MRN:          [id number]  DATE:08/12/2008                            DOB:          1921-04-11    PRIMARY CARE PHYSICIAN:  Nat Christen, M.D.   REASON FOR VISIT:  Routine followup.   HISTORY OF PRESENT ILLNESS:  This is my first meeting with [name].  She is a very pleasant woman previously followed by Dr. Harrington Challenger.  I do not  have complete office note information, although I see that she has been  evaluated previously for recurrent palpitations and had documentation of  what looks to be most consistent with paroxysmal supraventricular  tachycardia, not clearly atrial fibrillation.  This has improved with  the addition of low-dose atenolol and she in fact really denies having  any problems of palpitations over the last several months.  She also was  taking 81 mg aspirin.  Previous ischemic testing was also reassuring  with an adenosine Myoview in October 2008, demonstrating no ischemia  with an ejection fraction greater than 70%.  She denies having any  problem of angina or breathlessness.  She generally feels somewhat  fatigued.  She also seems to be at times under stress caring for her  husband.   ALLERGIES:  Ibuprofen.   PRESENT MEDICATIONS:  1. Multivitamin 1 p.o. daily.  2. Aspirin 81 mg p.o. daily.  3. Fiber supplements.  4. Iron supplements.  5. Calcium with vitamin D.  6. Niacin 500 mg p.o. daily.  7. Atenolol 12.5 mg p.o. daily.  8. Prilosec 20 mg p.o. daily.   REVIEW OF SYSTEMS:  As outlined above.  Otherwise, negative.   PHYSICAL EXAMINATION:  VITAL SIGNS:  Blood pressure 117/64, heart rate  is 69, and weight is 147 pounds.  The patient is comfortable, in no  acute distress.  NECK:  No elevated jugular venous pressure.  No audible bruits.  No  thyromegaly is noted.  LUNGS:  Clear without labored breathing at rest.  CARDIAC:  Regular rate and rhythm.  No pathological murmur or S3 or  gallop.  EXTREMITIES:  Show no pitting edema.   IMPRESSION AND RECOMMENDATIONS:  Paroxysmal supraventricular  tachycardia, sporadic and fairly longstanding based on patient's  description.  This is well documented with previous event recording.  She feels much better with the addition of low-dose atenolol and I would  recommend continuing the same as well as aspirin daily.  If she has any  progressive  recurrences, we can consider further medication adjustments or perhaps  even electrophysiology referral.  For the time being, she seems to be  quite stable.  We will plan an annual followup.     Satira Sark, MD  Electronically Signed    SGM/MedQ  DD: 08/12/2008  DT: 08/13/2008  Job #: [id number]   cc:   Nat Christen, M.D.

## 2011-05-01 NOTE — Op Note (Signed)
[name], [name]                  ACCOUNT NO.:  [id number]   MEDICAL RECORD NO.:  [id number]          PATIENT TYPE:  AMB   LOCATION:  DAY                           FACILITY:  [facility]   PHYSICIAN:  R. Garfield Cornea, M.D. DATE OF BIRTH:  [birth year]   DATE OF PROCEDURE:  06/30/2007  DATE OF DISCHARGE:  06/30/2007                               OPERATIVE REPORT   PROCEDURE:  Diagnostic esophagogastroduodenoscopy.   INDICATIONS FOR PROCEDURE:  This is an 77 year old lady with numerous GI  and non GI symptoms consistent with some vague upper GI symptoms  including some esophageal dysphagia symptoms, early satiety.  EGD now  being done.  This approach has been discussed the patient at length.  Potential risks, benefits and alternatives have been discussed.  Questions answered.  Please see documentation in the medical record.   PROCEDURE NOTE:  Oxygen saturation, blood pressure, pulse and  respirations were monitored throughout the entire procedure.  Conscious  sedation with  Versed 4 mg IV, Demerol 75 mg IV in divided doses.  Instrument was the Pentax video chip system.   FINDINGS:  Examination of the tubular esophagus revealed no mucosal  abnormality.  EG junction easily traversed.   The stomach:  Gastric cavity was emptied and insufflated well with air.  A thorough examination of the gastric mucosa on retroflexion of the  proximal stomach, esophagogastric junction demonstrated small hiatal  hernia and multiple 1-3 mm fundal gland-type polyps.  The remainder of  the gastric mucosa appeared normal.  Pylorus patent, easily traversed.  Examination of the bulb, second portion revealed no abnormalities.   THERAPY AND DIAGNOSTIC MANEUVERS:  One of the fundal gland-type polyps  was biopsied for histologic study.   The patient tolerated the procedure well and was reactive to endoscopy.   IMPRESSION:  1. Normal esophagus.  2. Small hiatal hernia.  3. Multiple fundal gland-type polyps were  biopsied.   Otherwise normal stomach, patent pylorus.  Normal D1 and D2.   RECOMMENDATIONS:  1. Will proceed with solid phase gastric emptying study to further      evaluate her vague upper GI tract symptoms.  2. Follow-up on path.  3. Further recommendations to follow.      [name], M.D.  Electronically Signed     RMR/MEDQ  D:  07/10/2007  T:  07/10/2007  Job:  [id number]   cc:   Nat Christen, M.D.

## 2011-05-01 NOTE — Assessment & Plan Note (Signed)
[facility] OFFICE NOTE   NAME:[name], [name]                         MRN:          [id number]  DATE:09/18/2007                            DOB:          1921/06/22    IDENTIFICATION:  [name] presents on her own referral to cardiology  clinic, it is the first time I have seen her, for evaluation of  palpitations.   Note, the patient has been seen in the past in cardiology remotely in  clinic back in 2000.  She has also seen by Dr. Lattie Haw in the hospital  when she was admitted for weakness this past summer.  No etiology was  found.  Note, the patient has had intermittent palpitations in the past.  She said, though, recently, over the past few months her spells have  lasted longer, 8-10 minutes at a time.   The patient was followed by Dr. Truett Mainland, set up for an event monitor, which  she has been wearing.  She was called by [facility] and set  up for an appointment which she had earlier this week.  She was seen  earlier this week and told that she atrial fibrillation and that she  needed to be on Coumadin for risk of a stroke.  She was also placed on  Lopressor b.i.d.  The patient took 2 doses of this Monday night and  Tuesday morning, and complained of developing lower extremity edema for  which she stopped the Lopressor.  The edema still has not gone down.  While wearing the monitor she is only active 3 times, the other have  been just auto triggers.  She has noted palpitations on 3 occasions but  not at other times.  She does give out with activity, though she  attributes this to being out of shape, deconditioned, with muscle  weakness.   ALLERGIES:  IBUPROFEN which causes rash.   MEDICATIONS:  1. Multivitamin.  2. Calcium with D b.i.d.  3. Niacin over the counter 500 b.i.d.  4. Aspirin 81 mg daily.  5. Fiber tablets 5 times daily.  6. Iron daily.  7. Zometa every 6 months IV.  8.  Question Ensure.   PAST MEDICAL HISTORY:  1. History of dyslipidemia.  I do not have recent lipid values.  2. History of elevated liver function tests, being followed.  3. History of palpitations.  4. History of fatigue.  Workup inconclusive this summer.  5. History of anemia, likely iron deficiency.  6. History of breast cancer, 2001.  7. Rheumatic fever at age 44.  56. Degenerative joint disease, status post left hip replacement in May      2007.  9. Right hip replacement in August 2005.   PAST SURGICAL HISTORY:  1. Hip replacement x2.  2. Lumpectomy.  3. Partial mastectomy with irradiation to the left breast.  4. Partial hysterectomy.  5. Cholecystectomy.  6. Cataract surgery bilaterally.   FAMILY HISTORY:  Mother died at age 68, heart failure; father died at  29, stroke and heart failure.  History of stroke in the grandmother.  Paternal grandfather  with heart attack.   SOCIAL HISTORY:  The patient does not smoke, does not drink, uses 2  canes to walk.   REVIEW OF SYSTEMS:  All systems reviewed.  Negative except as noted  above.  Denies frank chest pain.   PHYSICAL EXAMINATION:  The patient is in no distress.  Blood pressure 125/74, pulse is 98, weight 138.  HEENT:  Normocephalic, atraumatic, EOMI.  PERRLA.  NECK:  JVP is normal, no bruits.  Her lungs are clear, no rales or wheezes.  CARDIAC EXAM:  Shows a regular rate and rhythm, S1, S2, no S3, S4 or  murmurs.  ABDOMEN:  Benign.  No hepatomegaly.  EXTREMITIES:  Good distal pulses, 1-2+ edema below the knee.   A 12-lead EKG not done.   IMPRESSION:  1. [name] is an 77 year old woman with palpitations, recently noted      an increase in the length.  She has worn an event monitor.  I have      received a few of these that have shown brief atrial tach and will      need to piece through these.  I have encouraged her to continue      using monitor.  Indeed she may need Coumadin.  Will check a TSH      today.  In  regards to her edema, I have not had experience with      metoprolol causing problems.  I would keep her off it for now.      Check again TSH, kidney function recently was checked and was      normal, and albumin was 2.9.  Will follow up with her.  2. With her fatigability, I would, if her thyroid is normal, go ahead      and schedule her for an adenosine Myoview, rule out occult coronary      artery disease.   I will be in touch with the patient once I have seen some more results  and where to proceed.  For now continue on current regimen.     Fay Records, MD, [facility]  Electronically Signed    PVR/MedQ  DD: 09/18/2007  DT: 09/19/2007  Job #: [id number]   cc:   Truett Mainland, MD

## 2011-05-01 NOTE — Letter (Signed)
May 31, 2010   [place] Luan Pulling, MD  [address]  [place], [place] [address]   Re:  CHELCEE, KORPI                  DOB:  07-15-1921   Dear [name],   I saw the patient back today and repeated her CT scan, and the left  lower lobe nodule has now increased in size to 10 x 17 mm.  Her blood  pressure is 137/69, pulse 72, respirations 18, sats were 97%.  I think  the thing to do with this obviously slowly but progressively increasing  nodule is to get a needle biopsy of this which I have ordered.  If this  turns out to be cancer, then I will refer her for stereotactic body  radiation therapy.  At her age, I think this would be to the best option  for her.  I appreciate the opportunity of seeing the patient.   Sincerely,   Nicanor Alcon, M.D.  Electronically Signed   DPB/MEDQ  D:  05/31/2010  T:  06/01/2010  Job:  [id number]   cc:   Lucia Gaskins, MD  Lanelle Bal, MD

## 2011-05-01 NOTE — Letter (Signed)
February 01, 2010   [place] Luan Pulling, MD  [address]  [place], [place] [address]   Re:  [name], CRESPO                  DOB:  12/24/1920   Dear [name],   I saw the patient in the office today, and this nodule in the left lower  lobe which is 16 x 8 mm on PET scan showed no evidence of uptake.  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  I have discussed this with the patient and  recommend that we just follow her up with another CT scan in 4 months.  If there is continued enlargement of this, then I will get a needle  biopsy of this and consider treating this with SBRT radiation.  Her  blood pressure was 148/76, pulse 83, respirations were 18, sats were  95%.   Sincerely,   Nicanor Alcon, M.D.  Electronically Signed   DPB/MEDQ  D:  02/01/2010  T:  02/02/2010  Job:  [id number]

## 2011-05-03 ENCOUNTER — Other Ambulatory Visit (HOSPITAL_COMMUNITY): Payer: Self-pay | Admitting: Oncology

## 2011-05-03 ENCOUNTER — Encounter (HOSPITAL_COMMUNITY): Payer: Medicare Other | Attending: Oncology

## 2011-05-03 DIAGNOSIS — J984 Other disorders of lung: Secondary | ICD-10-CM | POA: Insufficient documentation

## 2011-05-03 DIAGNOSIS — Z09 Encounter for follow-up examination after completed treatment for conditions other than malignant neoplasm: Secondary | ICD-10-CM | POA: Insufficient documentation

## 2011-05-03 DIAGNOSIS — C3490 Malignant neoplasm of unspecified part of unspecified bronchus or lung: Secondary | ICD-10-CM

## 2011-05-03 DIAGNOSIS — C50919 Malignant neoplasm of unspecified site of unspecified female breast: Secondary | ICD-10-CM

## 2011-05-03 DIAGNOSIS — M810 Age-related osteoporosis without current pathological fracture: Secondary | ICD-10-CM

## 2011-05-03 DIAGNOSIS — Z853 Personal history of malignant neoplasm of breast: Secondary | ICD-10-CM | POA: Insufficient documentation

## 2011-05-03 LAB — COMPREHENSIVE METABOLIC PANEL
AST: 27 U/L (ref 0–37)
Albumin: 3.5 g/dL (ref 3.5–5.2)
Alkaline Phosphatase: 74 U/L (ref 39–117)
Chloride: 99 mEq/L (ref 96–112)
GFR calc Af Amer: 59 mL/min — ABNORMAL LOW (ref >60)
Potassium: 4 mEq/L (ref 3.5–5.1)
Sodium: 135 mEq/L (ref 135–145)
Total Bilirubin: 0.3 mg/dL (ref 0.3–1.2)
Total Protein: 7.3 g/dL (ref 6.0–8.3)

## 2011-05-22 ENCOUNTER — Other Ambulatory Visit: Payer: Self-pay | Admitting: Cardiology

## 2011-06-14 ENCOUNTER — Ambulatory Visit (HOSPITAL_COMMUNITY)
Admission: RE | Admit: 2011-06-14 | Discharge: 2011-06-14 | Disposition: A | Discharge status: Home / Self Care | Payer: Medicare Other | Source: Ambulatory Visit | Attending: Radiation Oncology | Admitting: Radiation Oncology

## 2011-06-14 DIAGNOSIS — Z923 Personal history of irradiation: Secondary | ICD-10-CM | POA: Insufficient documentation

## 2011-06-14 DIAGNOSIS — Z853 Personal history of malignant neoplasm of breast: Secondary | ICD-10-CM | POA: Insufficient documentation

## 2011-06-14 DIAGNOSIS — E049 Nontoxic goiter, unspecified: Secondary | ICD-10-CM | POA: Insufficient documentation

## 2011-06-14 DIAGNOSIS — C3490 Malignant neoplasm of unspecified part of unspecified bronchus or lung: Secondary | ICD-10-CM

## 2011-06-14 MED ORDER — IOHEXOL 300 MG/ML  SOLN
80.0000 mL | Freq: Once | INTRAMUSCULAR | Status: AC | PRN
  Administered 2011-06-14: 80 mL via INTRAVENOUS

## 2011-06-15 ENCOUNTER — Ambulatory Visit
Admission: RE | Admit: 2011-06-15 | Discharge: 2011-06-15 | Disposition: A | Discharge status: Home / Self Care | Payer: Medicare Other | Source: Ambulatory Visit | Attending: Radiation Oncology | Admitting: Radiation Oncology

## 2011-06-18 ENCOUNTER — Other Ambulatory Visit: Payer: Self-pay | Admitting: Radiation Oncology

## 2011-06-18 DIAGNOSIS — C3490 Malignant neoplasm of unspecified part of unspecified bronchus or lung: Secondary | ICD-10-CM

## 2011-08-31 ENCOUNTER — Other Ambulatory Visit (HOSPITAL_COMMUNITY): Payer: Self-pay | Admitting: *Deleted

## 2011-08-31 DIAGNOSIS — M1990 Unspecified osteoarthritis, unspecified site: Secondary | ICD-10-CM

## 2011-08-31 DIAGNOSIS — C50919 Malignant neoplasm of unspecified site of unspecified female breast: Secondary | ICD-10-CM

## 2011-09-10 ENCOUNTER — Inpatient Hospital Stay (HOSPITAL_COMMUNITY): Payer: Medicare Other

## 2011-09-10 ENCOUNTER — Encounter (HOSPITAL_COMMUNITY): Payer: Self-pay

## 2011-09-10 ENCOUNTER — Inpatient Hospital Stay (HOSPITAL_COMMUNITY)
Admission: AD | Admit: 2011-09-10 | Discharge: 2011-09-13 | DRG: 379 | Disposition: A | Discharge status: Home / Self Care | Payer: Medicare Other | Source: Ambulatory Visit | Attending: Family Medicine | Admitting: Family Medicine

## 2011-09-10 DIAGNOSIS — K922 Gastrointestinal hemorrhage, unspecified: Secondary | ICD-10-CM

## 2011-09-10 DIAGNOSIS — I10 Essential (primary) hypertension: Secondary | ICD-10-CM | POA: Diagnosis present

## 2011-09-10 DIAGNOSIS — K5710 Diverticulosis of small intestine without perforation or abscess without bleeding: Secondary | ICD-10-CM | POA: Diagnosis present

## 2011-09-10 DIAGNOSIS — D649 Anemia, unspecified: Secondary | ICD-10-CM

## 2011-09-10 DIAGNOSIS — D500 Iron deficiency anemia secondary to blood loss (chronic): Secondary | ICD-10-CM | POA: Diagnosis not present

## 2011-09-10 DIAGNOSIS — M810 Age-related osteoporosis without current pathological fracture: Secondary | ICD-10-CM | POA: Diagnosis present

## 2011-09-10 DIAGNOSIS — K2990 Gastroduodenitis, unspecified, without bleeding: Secondary | ICD-10-CM | POA: Diagnosis present

## 2011-09-10 DIAGNOSIS — E876 Hypokalemia: Secondary | ICD-10-CM | POA: Diagnosis not present

## 2011-09-10 DIAGNOSIS — K219 Gastro-esophageal reflux disease without esophagitis: Secondary | ICD-10-CM

## 2011-09-10 DIAGNOSIS — K2970 Gastritis, unspecified, without bleeding: Secondary | ICD-10-CM | POA: Diagnosis present

## 2011-09-10 DIAGNOSIS — D131 Benign neoplasm of stomach: Secondary | ICD-10-CM | POA: Diagnosis present

## 2011-09-10 DIAGNOSIS — E785 Hyperlipidemia, unspecified: Secondary | ICD-10-CM | POA: Diagnosis present

## 2011-09-10 DIAGNOSIS — K5731 Diverticulosis of large intestine without perforation or abscess with bleeding: Principal | ICD-10-CM | POA: Diagnosis present

## 2011-09-10 HISTORY — DX: Insomnia, unspecified: G47.00

## 2011-09-10 LAB — COMPREHENSIVE METABOLIC PANEL
ALT: 14 U/L (ref 0–35)
BUN: 17 mg/dL (ref 6–23)
Calcium: 9.5 mg/dL (ref 8.4–10.5)
Creatinine, Ser: 1.08 mg/dL (ref 0.50–1.10)
GFR calc Af Amer: 58 mL/min — ABNORMAL LOW (ref >60)
GFR calc non Af Amer: 48 mL/min — ABNORMAL LOW (ref >60)
Glucose, Bld: 102 mg/dL — ABNORMAL HIGH (ref 70–99)
Sodium: 137 mEq/L (ref 135–145)
Total Protein: 6.7 g/dL (ref 6.0–8.3)

## 2011-09-10 LAB — TSH: TSH: 1.087 u[IU]/mL (ref 0.350–4.500)

## 2011-09-10 LAB — DIFFERENTIAL
Eosinophils Absolute: 0 10*3/uL (ref 0.0–0.7)
Eosinophils Relative: 0 % (ref 0–5)
Lymphs Abs: 0.7 10*3/uL (ref 0.7–4.0)
Monocytes Absolute: 0.4 10*3/uL (ref 0.1–1.0)

## 2011-09-10 LAB — HEMOGLOBIN AND HEMATOCRIT, BLOOD
HCT: 29 % — ABNORMAL LOW (ref 36.0–46.0)
Hemoglobin: 9.7 g/dL — ABNORMAL LOW (ref 12.0–15.0)

## 2011-09-10 LAB — IRON AND TIBC
Iron: 83 ug/dL (ref 42–135)
TIBC: 248 ug/dL — ABNORMAL LOW (ref 250–470)
UIBC: 165 ug/dL (ref 125–400)

## 2011-09-10 LAB — CBC
HCT: 30.4 % — ABNORMAL LOW (ref 36.0–46.0)
MCH: 32.3 pg (ref 26.0–34.0)
MCV: 97.1 fL (ref 78.0–100.0)
Platelets: 195 10*3/uL (ref 150–400)
RBC: 3.13 MIL/uL — ABNORMAL LOW (ref 3.87–5.11)

## 2011-09-10 LAB — FERRITIN: Ferritin: 125 ng/mL (ref 10–291)

## 2011-09-10 LAB — RETICULOCYTES: Retic Count, Absolute: 34.3 10*3/uL (ref 19.0–186.0)

## 2011-09-10 LAB — PROTIME-INR
INR: 1.02 (ref 0.00–1.49)
Prothrombin Time: 13.6 seconds (ref 11.6–15.2)

## 2011-09-10 LAB — FOLATE: Folate: >20 ng/mL

## 2011-09-10 MED ORDER — SODIUM CHLORIDE 0.9 % IV SOLN
INTRAVENOUS | Status: DC
  Administered 2011-09-10 – 2011-09-12 (×3): via INTRAVENOUS

## 2011-09-10 MED ORDER — LORAZEPAM 0.5 MG PO TABS: 0.5000 mg | ORAL_TABLET | Freq: Every evening | ORAL | Status: DC | PRN

## 2011-09-10 MED ORDER — PEG 3350-KCL-NA BICARB-NACL 420 G PO SOLR
2000.0000 mL | Freq: Once | ORAL | Status: AC
  Filled 2011-09-10: qty 4000

## 2011-09-10 MED ORDER — PANTOPRAZOLE SODIUM 40 MG PO TBEC
40.0000 mg | DELAYED_RELEASE_TABLET | Freq: Every day | ORAL | Status: DC
  Administered 2011-09-10: 40 mg via ORAL
  Filled 2011-09-10: qty 1

## 2011-09-10 MED ORDER — SODIUM CHLORIDE 0.9 % IJ SOLN
INTRAMUSCULAR | Status: AC
  Filled 2011-09-10: qty 10

## 2011-09-10 MED ORDER — PNEUMOCOCCAL VAC POLYVALENT 25 MCG/0.5ML IJ INJ
0.5000 mL | INJECTION | Freq: Once | INTRAMUSCULAR | Status: DC
  Filled 2011-09-10: qty 0.5

## 2011-09-10 MED ORDER — PANTOPRAZOLE SODIUM 40 MG IV SOLR
40.0000 mg | Freq: Every day | INTRAVENOUS | Status: DC
  Administered 2011-09-11: 40 mg via INTRAVENOUS
  Filled 2011-09-10: qty 40

## 2011-09-10 MED ORDER — METOPROLOL TARTRATE 25 MG PO TABS
12.5000 mg | ORAL_TABLET | Freq: Every day | ORAL | Status: DC
  Administered 2011-09-10: 12.5 mg via ORAL
  Administered 2011-09-11: 12:00:00 via ORAL
  Administered 2011-09-12: 12.5 mg via ORAL
  Filled 2011-09-10 (×3): qty 1

## 2011-09-10 MED ORDER — PEG 3350-KCL-NABCB-NACL-NASULF 236 G PO SOLR
2000.0000 mL | ORAL | Status: AC
  Administered 2011-09-10: 2000 mL via ORAL
  Filled 2011-09-10: qty 4000

## 2011-09-10 NOTE — Consult Note (Cosign Needed)
Referring Provider: Cindie Laroche Primary Care Physician:  Maricela Curet, MD Primary Gastroenterologist:  Dr. Gala Romney  Reason for Consultation:  Rectal Bleeding  HPI:  [name] is a 77 y.o. female referred by Dr. Cindie Laroche for GI bleed.  C/o 4 episodes of dark burgundy stools w/ clots this morning starting at 8am.. Denies any abdominal pain.   Denies any upper GI symptoms including heartburn, indigestion, nausea, vomiting, dysphagia, odynophagia or anorexia.  Occasional constipation-had taken a couple colace in the past week.  Denies diarrhea.  Was on naproxen for shoulder/knee pain x 10 days qhs, stopped 6 days ago.  Was changing over to ativan last night for anxiety & insomnia, took 1 last night for the 2nd time.  Last colonoscopy 2005 by Dr Laural Golden. Hgb 10.1.  Past Medical History  Diagnosis Date  . Lung cancer 05/2010    Left 2011 - rad x 3  . Rheumatic fever     Age 89  . Pneumonia     RLL with sepsis  . Breast cancer     Left s/p lumpectomy and XRT   . Biceps tendon rupture     Bilateral  . Elevated liver enzymes     Biopsy consistent with steatohepatitis  . Right thyroid nodule   . Anxiety   . Lymphedema     Left arm  . Overactive bladder   . PSVT (paroxysmal supraventricular tachycardia)   . Osteoporosis   . Osteoarthritis   . Low back pain     scoliosis  . Hyperlipidemia   . GERD (gastroesophageal reflux disease)     Last EGD Dr Gala Romney 06/2007->[facility], multiple fundic gland polyps  . Type 2 diabetes mellitus   . Anemia     NOS  . Allergic rhinitis   . S/P colonoscopy 2005    Dr Rehman-diverticulosis, ext hemorrhoids  . Insomnia     Past Surgical History  Procedure Date  . Cataract extraction 2003    Lens implant  . Cholecystectomy 1965  . Total hip arthroplasty 2005  . Abdominal hysterectomy 1964  . Breast lumpectomy 2001  . Cystoscopy 2007  . Breast biopsy     X 3 w/ cystectomy  . Revision total hip arthroplasty 2007    Left  . Liver biopsy 2008     Prior to Admission medications   Medication Sig Start Date End Date Taking? Authorizing Provider  aspirin 81 MG EC tablet Take 81 mg by mouth daily.     Yes Historical Provider, MD  atenolol (TENORMIN) 25 MG tablet TAKE (1/2) TABLET BY     MOUTH DAILY. 05/22/11  Yes Satira Sark, MD  Biotin 1000 MCG tablet Take 1,000 mcg by mouth daily.     Yes Historical Provider, MD  Calcium Carbonate-Vitamin D (CALTRATE 600+D) 600-400 MG-UNIT per tablet Take 1 tablet by mouth daily.     Yes Historical Provider, MD  ferrous sulfate dried (SLOW FE) 160 (50 FE) MG TBCR Take 160 mg by mouth daily.     Yes Historical Provider, MD  folic acid (FOLVITE) 680 MCG tablet Take 400 mcg by mouth daily.     Yes Historical Provider, MD  glucose blood test strip 1 each as needed. Use as instructed    Yes Historical Provider, MD  LORazepam (ATIVAN) 0.5 MG tablet Take 0.5 mg by mouth at bedtime as needed. For sleep    Yes Historical Provider, MD  Multiple Vitamin (MULTIVITAMIN) capsule Take 1 capsule by mouth daily.  Yes Historical Provider, MD  nabumetone (RELAFEN) 500 MG tablet Take 500 mg by mouth 2 (two) times daily as needed. pain    Yes Historical Provider, MD  naproxen (NAPROSYN) 500 MG tablet Take 500 mg by mouth 2 (two) times daily with a meal.     Yes Historical Provider, MD  niacin (SLO-NIACIN) 500 MG tablet Take 500 mg by mouth daily.     Yes Historical Provider, MD  omeprazole (PRILOSEC) 20 MG capsule Take 20 mg by mouth daily.     Yes Historical Provider, MD  polycarbophil (FIBERCON) 625 MG tablet Take 625 mg by mouth. Take 1 tablet at breakfast, 2 tablets at lunch and 2 tablets with dinner    Yes Historical Provider, MD  folic acid (FOLVITE) 1 MG tablet Take 1 mg by mouth daily.      Historical Provider, MD  sodium chloride 0.9 % SOLN 100 mL with zolendronic acid 4 MG/5ML CONC 4 mg Inject 4 mg into the vein every 6 (six) months. Due to receive in 3 weeks    Historical Provider, MD  Zoledronic Acid 4  MG/100ML SOLN Inject 4 mg into the vein every 6 (six) months. Due to receive in 3 weeks    Historical Provider, MD    Current Facility-Administered Medications  Medication Dose Route Frequency Provider Last Rate Last Dose  . 0.9 %  sodium chloride infusion   Intravenous Continuous Richard M Dondiego 100 mL/hr at 09/10/11 1446    . LORazepam (ATIVAN) tablet 0.5 mg  0.5 mg Oral QHS PRN Ralene Bathe Dondiego      . metoprolol tartrate (LOPRESSOR) tablet 12.5 mg  12.5 mg Oral Daily Richard M Dondiego   12.5 mg at 09/10/11 1445  . pantoprazole (PROTONIX) injection 40 mg  40 mg Intravenous Q1200 Richard M Dondiego      . pneumococcal 23 valent vaccine (PNU-IMMUNE) injection 0.5 mL  0.5 mL Intramuscular Once [employer]      . polyethylene glycol (GoLYTELY/NuLYTELY) solution 2,000 mL  2,000 mL Oral NOW Vickey Huger, NP   2,000 mL at 09/10/11 1754  . polyethylene glycol-electrolytes (NuLYTELY/GoLYTELY) solution 2,000 mL  2,000 mL Oral Once Vickey Huger, NP      . sodium chloride 0.9 % injection           . DISCONTD: pantoprazole (PROTONIX) EC tablet 40 mg  40 mg Oral Q1200 Richard M Dondiego   40 mg at 09/10/11 1445    Allergies as of 09/10/2011 - Review Complete 09/10/2011  Allergen Reaction Noted  . Ibuprofen Hives 01/28/2007  . Statins Other (See Comments) 01/28/2007    Family History:There is no known family history of colorectal carcinoma , liver disease, or inflammatory bowel disease.  Problem Relation Age of Onset  . Heart failure Mother   . Osteoporosis Mother   . Hypertension Father   . Stroke Father   . Heart failure Father   . Leukemia Brother   . Heart failure Brother     History   Social History  . Marital Status: Married    Spouse Name: N/A    Number of Children: 2  . Years of Education: N/A   Occupational History  . retired Tax Therapist, nutritional    Social History Main Topics  . Smoking status: Never Smoker   . Smokeless tobacco: Never Used   Comment: 2nd  hand-husband  . Alcohol Use: No  . Drug Use: No  . Sexually Active: Not Currently   Other Topics Concern  .  Not on file   Social History Narrative   2 adopted children    Review of Systems: Gen: c/o generalized fatigue & Malaise. +insomnia. CV: Denies chest pain, angina, palpitations, syncope, orthopnea, PND, peripheral edema, and claudication. Resp: Denies dyspnea at rest, dyspnea with exercise, cough, sputum, wheezing, coughing up blood, and pleurisy. GI: Denies vomiting blood, jaundice, and fecal incontinence.   Denies dysphagia or odynophagia. GU : Denies urinary burning, blood in urine, urinary frequency, urinary hesitancy, nocturnal urination, and urinary incontinence. MS: Denies joint pain, limitation of movement, and swelling, stiffness, low back pain, extremity pain. Denies muscle weakness, cramps, atrophy.  Derm: Denies rash, itching, dry skin, hives, moles, warts, or unhealing ulcers.  Psych: Denies depression, anxiety, memory loss, suicidal ideation, hallucinations, paranoia, and confusion. Heme: Denies bruising and enlarged lymph nodes.  Physical Exam: Vital signs in last 24 hours: Temp:  [97.5 F (36.4 C)-98 F (36.7 C)] 98 F (36.7 C) (09/24 2103) Pulse Rate:  [71-74] 71  (09/24 2103) Resp:  [16-20] 20  (09/24 2103) BP: (105-128)/(60-70) 128/70 mmHg (09/24 2103) SpO2:  [99 %] 99 % (09/24 2103) Last BM Date: 09/10/11 General:   Alert,  Well-developed, well-nourished, pleasant and cooperative in NAD.Appears younger than stated age. Head:  Normocephalic and atraumatic. Eyes:  Sclera clear, no icterus.   Conjunctiva pink. Ears:  Normal auditory acuity. Nose:  No deformity, discharge,  or lesions. Mouth:  No deformity or lesions, OP pink/moist. Neck:  Supple; no masses or thyromegaly. Lungs:  Clear throughout to auscultation.   No wheezes, crackles, or rhonchi. No acute distress. Heart:  Regular rate and rhythm; no murmurs, clicks, rubs,  or gallops. Abdomen:   Soft, nontender and nondistended. No masses, hepatosplenomegaly or hernias noted. Normal bowel sounds, without guarding, and without rebound.   Rectal:  Deferred until time of colonoscopy.   Msk:  Symmetrical without gross deformities. Normal posture. Pulses:  Normal pulses noted. Extremities:  Without clubbing or edema. Neurologic:  Alert and  oriented x4;  grossly normal neurologically. Skin:  Intact without significant lesions or rashes. Cervical Nodes:  No significant cervical adenopathy. Psych:  Alert and cooperative. Normal mood and affect.  Lab Results:  Basename 09/10/11 1825 09/10/11 1230  WBC -- 9.7  HGB 9.7* 10.1*  HCT 29.0* 30.4*  PLT -- 195   BMET  Basename 09/10/11 1230  NA 137  K 4.6  CL 101  CO2 28  GLUCOSE 102*  BUN 17  CREATININE 1.08  CALCIUM 9.5   LFT  Basename 09/10/11 1230  PROT 6.7  ALBUMIN 3.3*  AST 21  ALT 14  ALKPHOS 62  BILITOT 0.4  BILIDIR --  IBILI --   PT/INR  Basename 09/10/11 1230  LABPROT 13.6  INR 1.02   Studies/Results: Dg Chest 2 View  09/10/2011  *RADIOLOGY REPORT*  Clinical Data: Question pneumonia.  Blood in stool.  CHEST - 2 VIEW  Comparison: CT chest 06/14/2011 and chest radiograph 03/05/2011.  Findings: Trachea is midline.  Heart size normal.  Thoracic aorta is calcified.  Left lower lobe scarring.  Scarring is also seen in the right middle lobe and lingula.  Lungs are otherwise clear.  No pleural fluid.  Old left rib fractures.  IMPRESSION: Bibasilar scarring.  Original Report Authenticated By: Luretha Rued, M.D.    Impression: [name] is a 77 y.o. caucasian female w/ acute GI bleeding.  I suspect lower GI source secondary to diverticula, NSAID-induced injury, ischemia, or colorectal neoplasia.  Cannot r/o rapid-transit upper  GI or small bowel source at this point.  Acute on chronic anemia secondary to GI bleed.    Plan: Colonoscopy +/- EGD w/Dr Oneida Alar tomorrow.  I have discussed risks & benefits which  include, but are not limited to, bleeding, infection, perforation & drug reaction.  The patient agrees with this plan & written consent will be obtained.   NPO after MN Split prep to start tonight Follow h/hand transfuse to keep hgb around 9 grams  Agree w/ PPI Avoid NSAIDs   LOS: 0 days   Vickey Huger  09/10/2011, 10:07 PM

## 2011-09-10 NOTE — H&P (Signed)
[name], STUMPP NO.:  [id number]  MEDICAL RECORD NO.:  [id number]  LOCATION:  V784                          FACILITY:  [facility]  PHYSICIAN:  Unk Lightning, MDDATE OF BIRTH:  1920/12/19  DATE OF ADMISSION:  09/10/2011 DATE OF DISCHARGE:  LH                             HISTORY & PHYSICAL   The patient is an 77 year old white female relatively healthy in the last 2 years who basically had 3 episodes of hematochezia this a.m. toilet, but she denies any pain, was seen in my office, felt to be somewhat pallor and mildly clammy and diaphoretic and was subsequently admitted.  Stat hemoglobin revealed a hemoglobin of 10.1 now.  There will be serial hemoglobin and hematocrit ordered every 6 hours.  The patient had a colonoscopy in 2005 revealing 1 or 2 polyps removed by Dr. Gala Romney.  No other significant abnormalities.  She denies any vomiting, hematemesis, melena.  Denies any painful defecation.  The patient is admitted for hematochezia, new onset.  She does not take NSAIDS.  She denies any anginal pain, syncope.  She does have some mild dizziness.  PAST MEDICAL HISTORY:  Significant for breast carcinoma status post lumpectomy and lung carcinoma, left lower lobe with status post radiation therapy 1 year ago.  She likewise has mild hypertension, occasional palpitations, history of iron-deficiency anemia and mild hyperlipidemia as well as osteoporosis and insomnia.  PAST SURGICAL HISTORY:  Remarkable for TAH-BSO status post cholecystectomy, status post left breast lumpectomy and status post polypectomy x1 or 2 in 2005.  CURRENT MEDICATIONS: 1. WelChol packet 1 p.o. daily. 2. Prilosec 20 mg p.o. daily. 3. Atenolol 12.5 mg p.o. daily. 4. Aspirin 81 mg p.o. daily. 5. Caltrate 600/400 one p.o. daily. 6. Ativan 0.5 mg nightly p.r.n. sleep.  REVIEW OF SYSTEMS:  Negative for seizures, tremors, syncope, polyuria, polydipsia, weight loss, tremors.  She denies  polyuria, polydipsia.  PHYSICAL EXAMINATION:  VITAL SIGNS:  Temperature 97.5, pulse is 74 and regular, respiratory rate is 16, blood pressure 105/60, O2 sat is 99%. HEENT:  Eyes, PERRLA.  Extraocular movements intact.  Sclerae clear. Conjunctivae pink. SKIN:  Appears mild pallor, mild diaphoresis. NECK:  No JVD, no carotid bruits, no thyromegaly, no thyroid bruits. LUNGS:  Prolonged respiratory phase.  Scattered rhonchi.  No rales.  No wheeze appreciable. HEART:  Regular rhythm, 1/6 aortic outflow murmur.  No S3, S4 or gallops.  No heaves, thrills or rubs. ABDOMEN:  Soft, nontender.  Bowel sounds normoactive.  No guarding, rebound, or hepatosplenomegaly. RECTAL:  Not performed in office due to obvious right red blood per rectum. NEUROLOGIC:  Cranial nerves II-XII grossly intact.  The patient moves all 4 extremities.  Plantars are downgoing.  IMPRESSION: 1. New episode, three episodes of hematochezia this a.m. 2. Pallor and possible anemia. 3. History of hypertension. 4. History of palpitations. 5. Osteoporosis. 6. Hyperlipidemia.  PLAN:  To admit, type and cross match for 2 units, hemoglobin and hematocrit q.6 h. IV, 0.9 normal saline, clear liquid diet.  We will obtain GI consult per Dr. Gala Romney to reevaluate and we will give IV Protonix 40 mg q. 12 h. and we will make further  recommendations as the database expands.     Unk Lightning, MD     RMD/MEDQ  D:  09/10/2011  T:  09/10/2011  Job:  [id number]

## 2011-09-10 NOTE — H&P (Signed)
[id number] 

## 2011-09-10 NOTE — Progress Notes (Signed)
Signed consent for GI placed in patient chart.

## 2011-09-11 ENCOUNTER — Encounter (HOSPITAL_COMMUNITY)
Admission: AD | Disposition: A | Discharge status: Home / Self Care | Payer: Self-pay | Source: Ambulatory Visit | Attending: Family Medicine

## 2011-09-11 ENCOUNTER — Other Ambulatory Visit: Payer: Self-pay | Admitting: Gastroenterology

## 2011-09-11 ENCOUNTER — Encounter (HOSPITAL_COMMUNITY): Payer: Self-pay | Admitting: *Deleted

## 2011-09-11 DIAGNOSIS — K5710 Diverticulosis of small intestine without perforation or abscess without bleeding: Secondary | ICD-10-CM

## 2011-09-11 DIAGNOSIS — K5730 Diverticulosis of large intestine without perforation or abscess without bleeding: Secondary | ICD-10-CM

## 2011-09-11 DIAGNOSIS — K2970 Gastritis, unspecified, without bleeding: Secondary | ICD-10-CM

## 2011-09-11 DIAGNOSIS — K922 Gastrointestinal hemorrhage, unspecified: Secondary | ICD-10-CM

## 2011-09-11 DIAGNOSIS — K2990 Gastroduodenitis, unspecified, without bleeding: Secondary | ICD-10-CM

## 2011-09-11 DIAGNOSIS — K921 Melena: Secondary | ICD-10-CM

## 2011-09-11 DIAGNOSIS — D131 Benign neoplasm of stomach: Secondary | ICD-10-CM

## 2011-09-11 HISTORY — PX: COLONOSCOPY: SHX5424

## 2011-09-11 HISTORY — PX: ESOPHAGOGASTRODUODENOSCOPY: SHX5428

## 2011-09-11 LAB — BASIC METABOLIC PANEL
BUN: 16 mg/dL (ref 6–23)
CO2: 26 mEq/L (ref 19–32)
Chloride: 102 mEq/L (ref 96–112)
GFR calc Af Amer: 60 mL/min — ABNORMAL LOW (ref >60)
GFR calc non Af Amer: 49 mL/min — ABNORMAL LOW (ref >60)
Potassium: 3.4 mEq/L — ABNORMAL LOW (ref 3.5–5.1)
Sodium: 136 mEq/L (ref 135–145)

## 2011-09-11 LAB — HEPATIC FUNCTION PANEL
ALT: 15 U/L (ref 0–35)
AST: 27 U/L (ref 0–37)
Albumin: 2.9 g/dL — ABNORMAL LOW (ref 3.5–5.2)

## 2011-09-11 LAB — HEMOGLOBIN AND HEMATOCRIT, BLOOD
Hemoglobin: 8.5 g/dL — ABNORMAL LOW (ref 12.0–15.0)
Hemoglobin: 8.4 g/dL — ABNORMAL LOW (ref 12.0–15.0)

## 2011-09-11 LAB — PROTIME-INR: Prothrombin Time: 14.3 seconds (ref 11.6–15.2)

## 2011-09-11 SURGERY — COLONOSCOPY
Anesthesia: Moderate Sedation

## 2011-09-11 MED ORDER — MIDAZOLAM HCL 5 MG/5ML IJ SOLN
INTRAMUSCULAR | Status: AC
  Filled 2011-09-11: qty 5

## 2011-09-11 MED ORDER — MEPERIDINE HCL 100 MG/ML IJ SOLN
INTRAMUSCULAR | Status: DC | PRN
  Administered 2011-09-11: 50 mg via INTRAVENOUS
  Administered 2011-09-11: 25 mg via INTRAVENOUS

## 2011-09-11 MED ORDER — POTASSIUM CHLORIDE CRYS ER 20 MEQ PO TBCR
20.0000 meq | EXTENDED_RELEASE_TABLET | Freq: Every day | ORAL | Status: AC
  Administered 2011-09-11 – 2011-09-12 (×2): 20 meq via ORAL
  Filled 2011-09-11 (×2): qty 1

## 2011-09-11 MED ORDER — MIDAZOLAM HCL 5 MG/5ML IJ SOLN
INTRAMUSCULAR | Status: DC | PRN
  Administered 2011-09-11: 1 mg via INTRAVENOUS
  Administered 2011-09-11: 2 mg via INTRAVENOUS
  Administered 2011-09-11: 1 mg via INTRAVENOUS

## 2011-09-11 MED ORDER — STERILE WATER FOR IRRIGATION IR SOLN
Status: DC | PRN
  Administered 2011-09-11: 08:00:00

## 2011-09-11 MED ORDER — MEPERIDINE HCL 100 MG/ML IJ SOLN
INTRAMUSCULAR | Status: AC
  Filled 2011-09-11: qty 1

## 2011-09-11 NOTE — Progress Notes (Signed)
[id number] 

## 2011-09-11 NOTE — Interval H&P Note (Signed)
History and Physical Interval Note:   09/11/2011   8:05 AM   [name]  has presented today for surgery, with the diagnosis of GI Bleed  The various methods of treatment have been discussed with the patient and family. After consideration of risks, benefits and other options for treatment, the patient has consented to  Procedure(s): COLONOSCOPY ESOPHAGOGASTRODUODENOSCOPY (EGD) as a surgical intervention .  I have reviewed the patients' chart and labs.  Questions were answered to the patient's satisfaction.     Barney Drain  MD

## 2011-09-11 NOTE — Progress Notes (Signed)
[name], [name] NO.:  [id number]  MEDICAL RECORD NO.:  [id number]  LOCATION:  E366                          FACILITY:  [facility]  PHYSICIAN:  Unk Lightning, MDDATE OF BIRTH:  01/15/1921  DATE OF PROCEDURE: DATE OF DISCHARGE:                                PROGRESS NOTE   The patient is admitted with hematochezia and admission hemoglobin was 10.1, and it drifted down to 8.5 and up to 8.9.  Other problems are mild hypokalemia with potassium of 3.4 today.  The patient is status post EGD and colonoscopy today.  I am not aware of the results.  PHYSICAL EXAMINATION:  GENERAL:  She is arousable and alert and oriented 3 spheres. VITAL SIGNS:  Blood pressure is 95/48, currently temperature 98.2, pulse is 86 and regular, respiratory rate is 15, O2 sat is 99%. NECK:  No JVD. LUNGS:  Clear to A and P.  No rales, wheeze, or rhonchi appreciable. HEART:  Regular rhythm.  No murmurs, gallops, or rubs.  Plan right now is to give 20 mEq of potassium orally p.o. daily, check BMET daily and CBC daily, advance diet as tolerated, and await results of colonoscopy to see results and etiologies of hematochezia.  She is hemodynamically stable.     Unk Lightning, MD     RMD/MEDQ  D:  09/11/2011  T:  09/11/2011  Job:  [id number]

## 2011-09-11 NOTE — H&P (Signed)
Consult Note signed by Vickey Huger, NP at 09/10/11 2214     Author: Vickey Huger, NP Service: Gastroenterology Author Type: FAMILY NURSE PRACTITIONER    Filed: 09/10/11 2214 Note Time: 09/10/11 1602 Cosign Required: Yes       Referring Provider: Cindie Laroche Primary Care Physician:  Maricela Curet, MD Primary Gastroenterologist:  Dr. Gala Romney   Reason for Consultation:  Rectal Bleeding   HPI:  [name] is a 77 y.o. female referred by Dr. Cindie Laroche for GI bleed.  C/o 4 episodes of dark burgundy stools w/ clots this morning starting at 8am.. Denies any abdominal pain.   Denies any upper GI symptoms including heartburn, indigestion, nausea, vomiting, dysphagia, odynophagia or anorexia.  Occasional constipation-had taken a couple colace in the past week.  Denies diarrhea.  Was on naproxen for shoulder/knee pain x 10 days qhs, stopped 6 days ago.  Was changing over to ativan last night for anxiety & insomnia, took 1 last night for the 2nd time.  Last colonoscopy 2005 by Dr Laural Golden. Hgb 10.1.    Past Medical History   Diagnosis  Date   .  Lung cancer  05/2010       Left 2011 - rad x 3   .  Rheumatic fever         Age 31   .  Pneumonia         RLL with sepsis   .  Breast cancer         Left s/p lumpectomy and XRT    .  Biceps tendon rupture         Bilateral   .  Elevated liver enzymes         Biopsy consistent with steatohepatitis   .  Right thyroid nodule     .  Anxiety     .  Lymphedema         Left arm   .  Overactive bladder     .  PSVT (paroxysmal supraventricular tachycardia)     .  Osteoporosis     .  Osteoarthritis     .  Low back pain         scoliosis   .  Hyperlipidemia     .  GERD (gastroesophageal reflux disease)         Last EGD Dr Gala Romney 06/2007->sm HH, multiple fundic gland polyps   .  Type 2 diabetes mellitus     .  Anemia         NOS   .  Allergic rhinitis     .  S/P colonoscopy  2005       Dr Rehman-diverticulosis, ext hemorrhoids   .  Insomnia           Past Surgical History   Procedure  Date   .  Cataract extraction  2003       Lens implant   .  Cholecystectomy  1965   .  Total hip arthroplasty  2005   .  Abdominal hysterectomy  1964   .  Breast lumpectomy  2001   .  Cystoscopy  2007   .  Breast biopsy         X 3 w/ cystectomy   .  Revision total hip arthroplasty  2007       Left   .  Liver biopsy  2008         Prior to Admission medications    Medication  Sig  Start  Date  End Date  Taking?  Authorizing Provider   aspirin 81 MG EC tablet  Take 81 mg by mouth daily.        Yes  Historical Provider, MD   atenolol (TENORMIN) 25 MG tablet  TAKE (1/2) TABLET BY     MOUTH DAILY.  05/22/11    Yes  Satira Sark, MD   Biotin 1000 MCG tablet  Take 1,000 mcg by mouth daily.        Yes  Historical Provider, MD   Calcium Carbonate-Vitamin D (CALTRATE 600+D) 600-400 MG-UNIT per tablet  Take 1 tablet by mouth daily.        Yes  Historical Provider, MD   ferrous sulfate dried (SLOW FE) 160 (50 FE) MG TBCR  Take 160 mg by mouth daily.        Yes  Historical Provider, MD   folic acid (FOLVITE) 540 MCG tablet  Take 400 mcg by mouth daily.        Yes  Historical Provider, MD   glucose blood test strip  1 each as needed. Use as instructed       Yes  Historical Provider, MD   LORazepam (ATIVAN) 0.5 MG tablet  Take 0.5 mg by mouth at bedtime as needed. For sleep       Yes  Historical Provider, MD   Multiple Vitamin (MULTIVITAMIN) capsule  Take 1 capsule by mouth daily.        Yes  Historical Provider, MD   nabumetone (RELAFEN) 500 MG tablet  Take 500 mg by mouth 2 (two) times daily as needed. pain       Yes  Historical Provider, MD   naproxen (NAPROSYN) 500 MG tablet  Take 500 mg by mouth 2 (two) times daily with a meal.        Yes  Historical Provider, MD   niacin (SLO-NIACIN) 500 MG tablet  Take 500 mg by mouth daily.        Yes  Historical Provider, MD   omeprazole (PRILOSEC) 20 MG capsule  Take 20 mg by mouth daily.        Yes  Historical  Provider, MD   polycarbophil (FIBERCON) 625 MG tablet  Take 625 mg by mouth. Take 1 tablet at breakfast, 2 tablets at lunch and 2 tablets with dinner       Yes  Historical Provider, MD   folic acid (FOLVITE) 1 MG tablet  Take 1 mg by mouth daily.          Historical Provider, MD   sodium chloride 0.9 % SOLN 100 mL with zolendronic acid 4 MG/5ML CONC 4 mg  Inject 4 mg into the vein every 6 (six) months. Due to receive in 3 weeks        Historical Provider, MD   Zoledronic Acid 4 MG/100ML SOLN  Inject 4 mg into the vein every 6 (six) months. Due to receive in 3 weeks        Historical Provider, MD         Current Facility-Administered Medications   Medication  Dose  Route  Frequency  Provider  Last Rate  Last Dose   .  0.9 %  sodium chloride infusion     Intravenous  Continuous  Richard M Dondiego  100 mL/hr at 09/10/11 1446      .  LORazepam (ATIVAN) tablet 0.5 mg   0.5 mg  Oral  QHS PRN  Ralene Bathe Dondiego         .  metoprolol tartrate (LOPRESSOR) tablet 12.5 mg   12.5 mg  Oral  Daily  Richard M Dondiego     12.5 mg at 09/10/11 1445   .  pantoprazole (PROTONIX) injection 40 mg   40 mg  Intravenous  Q1200  Richard M Dondiego         .  pneumococcal 23 valent vaccine (PNU-IMMUNE) injection 0.5 mL   0.5 mL  Intramuscular  Once  [employer]         .  polyethylene glycol (GoLYTELY/NuLYTELY) solution 2,000 mL   2,000 mL  Oral  NOW  Vickey Huger, NP     2,000 mL at 09/10/11 1754   .  polyethylene glycol-electrolytes (NuLYTELY/GoLYTELY) solution 2,000 mL   2,000 mL  Oral  Once  Vickey Huger, NP         .  sodium chloride 0.9 % injection                  .  DISCONTD: pantoprazole (PROTONIX) EC tablet 40 mg   40 mg  Oral  Q1200  Richard M Dondiego     40 mg at 09/10/11 1445         Allergies as of 09/10/2011 - Review Complete 09/10/2011   Allergen  Reaction  Noted   .  Ibuprofen  Hives  01/28/2007   .  Statins  Other (See Comments)  01/28/2007         Family History:There is no known  family history of colorectal carcinoma , liver disease, or inflammatory bowel disease.   Problem  Relation  Age of Onset   .  Heart failure  Mother     .  Osteoporosis  Mother     .  Hypertension  Father     .  Stroke  Father     .  Heart failure  Father     .  Leukemia  Brother     .  Heart failure  Brother           History       Social History   .  Marital Status:  Married       Spouse Name:  N/A       Number of Children:  2   .  Years of Education:  N/A       Occupational History   .  retired Tax Therapist, nutritional         Social History Main Topics   .  Smoking status:  Never Smoker    .  Smokeless tobacco:  Never Used     Comment: 2nd hand-husband   .  Alcohol Use:  No   .  Drug Use:  No   .  Sexually Active:  Not Currently       Other Topics  Concern   .  Not on file       Social History Narrative     2 adopted children        Review of Systems: Gen: c/o generalized fatigue & Malaise. +insomnia. CV: Denies chest pain, angina, palpitations, syncope, orthopnea, PND, peripheral edema, and claudication. Resp: Denies dyspnea at rest, dyspnea with exercise, cough, sputum, wheezing, coughing up blood, and pleurisy. GI: Denies vomiting blood, jaundice, and fecal incontinence.   Denies dysphagia or odynophagia. GU : Denies urinary burning, blood in urine, urinary frequency, urinary hesitancy, nocturnal urination, and urinary incontinence. MS: Denies joint pain, limitation of movement, and swelling, stiffness, low back pain, extremity  pain. Denies muscle weakness, cramps, atrophy.   Derm: Denies rash, itching, dry skin, hives, moles, warts, or unhealing ulcers.   Psych: Denies depression, anxiety, memory loss, suicidal ideation, hallucinations, paranoia, and confusion. Heme: Denies bruising and enlarged lymph nodes.   Physical Exam: Vital signs in last 24 hours: Temp:  [97.5 F (36.4 C)-98 F (36.7 C)] 98 F (36.7 C) (09/24 2103) Pulse Rate:  [71-74] 71  (09/24  2103) Resp:  [16-20] 20  (09/24 2103) BP: (105-128)/(60-70) 128/70 mmHg (09/24 2103) SpO2:  [99 %] 99 % (09/24 2103) Last BM Date: 09/10/11 General:   Alert,  Well-developed, well-nourished, pleasant and cooperative in NAD.Appears younger than stated age. Head:  Normocephalic and atraumatic. Eyes:  Sclera clear, no icterus.   Conjunctiva pink. Ears:  Normal auditory acuity. Nose:  No deformity, discharge,  or lesions. Mouth:  No deformity or lesions, OP pink/moist. Neck:  Supple; no masses or thyromegaly. Lungs:  Clear throughout to auscultation.   No wheezes, crackles, or rhonchi. No acute distress. Heart:  Regular rate and rhythm; no murmurs, clicks, rubs,  or gallops. Abdomen:  Soft, nontender and nondistended. No masses, hepatosplenomegaly or hernias noted. Normal bowel sounds, without guarding, and without rebound.    Rectal:  Deferred until time of colonoscopy.    Msk:  Symmetrical without gross deformities. Normal posture. Pulses:  Normal pulses noted. Extremities:  Without clubbing or edema. Neurologic:  Alert and  oriented x4;  grossly normal neurologically. Skin:  Intact without significant lesions or rashes. Cervical Nodes:  No significant cervical adenopathy. Psych:  Alert and cooperative. Normal mood and affect.   Lab Results:   Basename  09/10/11 1825  09/10/11 1230   WBC  --  9.7   HGB  9.7*  10.1*   HCT  29.0*  30.4*   PLT  --  195      BMET   Basename  09/10/11 1230   NA  137   K  4.6   CL  101   CO2  28   GLUCOSE  102*   BUN  17   CREATININE  1.08   CALCIUM  9.5      LFT   Basename  09/10/11 1230   PROT  6.7   ALBUMIN  3.3*   AST  21   ALT  14   ALKPHOS  62   BILITOT  0.4   BILIDIR  --   IBILI  --      PT/INR   Basename  09/10/11 1230   LABPROT  13.6   INR  1.02      Studies/Results: Dg Chest 2 View   09/10/2011  *RADIOLOGY REPORT*  Clinical Data: Question pneumonia.  Blood in stool.  CHEST - 2 VIEW  Comparison: CT chest  06/14/2011 and chest radiograph 03/05/2011.  Findings: Trachea is midline.  Heart size normal.  Thoracic aorta is calcified.  Left lower lobe scarring.  Scarring is also seen in the right middle lobe and lingula.  Lungs are otherwise clear.  No pleural fluid.  Old left rib fractures.  IMPRESSION: Bibasilar scarring.  Original Report Authenticated By: Luretha Rued, M.D.       Impression: [name] is a 77 y.o. caucasian female w/ acute GI bleeding.  I suspect lower GI source secondary to diverticula, NSAID-induced injury, ischemia, or colorectal neoplasia. Cannot r/o rapid-transit upper GI or small bowel source at this point.  Acute on chronic anemia secondary to GI bleed.  Plan: Colonoscopy +/- EGD w/Dr Oneida Alar tomorrow.  I have discussed risks & benefits which include, but are not limited to, bleeding, infection, perforation & drug reaction.  The patient agrees with this plan & written consent will be obtained.    NPO after MN Split prep to start tonight Follow h/hand transfuse to keep hgb around 9 grams   Agree w/ PPI Avoid NSAIDs    LOS: 0 days    Vickey Huger  09/10/2011, 10:07 PM

## 2011-09-11 NOTE — OR Nursing (Signed)
Patient transported to room 323 via stretcher at 0935. Patient able to ambulate to bed. Temp-97.6, BP 112/70, P-83, R-18. Leonette Most RN in to relieve me of duties.

## 2011-09-12 DIAGNOSIS — K5731 Diverticulosis of large intestine without perforation or abscess with bleeding: Secondary | ICD-10-CM

## 2011-09-12 LAB — HEPATIC FUNCTION PANEL
AST: 34 U/L (ref 0–37)
Bilirubin, Direct: 0.1 mg/dL (ref 0.0–0.3)
Indirect Bilirubin: 0.2 mg/dL — ABNORMAL LOW (ref 0.3–0.9)
Total Bilirubin: 0.3 mg/dL (ref 0.3–1.2)

## 2011-09-12 LAB — COMPREHENSIVE METABOLIC PANEL
BUN: 18
CO2: 30
Calcium: 9.2
Creatinine, Ser: 1
GFR calc non Af Amer: 53 — ABNORMAL LOW
Glucose, Bld: 103 — ABNORMAL HIGH
Sodium: 136
Total Protein: 7.3

## 2011-09-12 LAB — BASIC METABOLIC PANEL
BUN: 10 mg/dL (ref 6–23)
Calcium: 8 mg/dL — ABNORMAL LOW (ref 8.4–10.5)
GFR calc Af Amer: >60 mL/min (ref >60)
GFR calc non Af Amer: 57 mL/min — ABNORMAL LOW (ref >60)
Glucose, Bld: 85 mg/dL (ref 70–99)
Potassium: 3.6 mEq/L (ref 3.5–5.1)

## 2011-09-12 LAB — HEMOGLOBIN AND HEMATOCRIT, BLOOD
HCT: 23.8 % — ABNORMAL LOW (ref 36.0–46.0)
HCT: 24.2 % — ABNORMAL LOW (ref 36.0–46.0)
Hemoglobin: 7.9 g/dL — ABNORMAL LOW (ref 12.0–15.0)
Hemoglobin: 8.2 g/dL — ABNORMAL LOW (ref 12.0–15.0)
Hemoglobin: 8.7 g/dL — ABNORMAL LOW (ref 12.0–15.0)

## 2011-09-12 LAB — PROTIME-INR: Prothrombin Time: 14.4 seconds (ref 11.6–15.2)

## 2011-09-12 MED ORDER — PANTOPRAZOLE SODIUM 40 MG PO TBEC
40.0000 mg | DELAYED_RELEASE_TABLET | Freq: Every day | ORAL | Status: DC
  Administered 2011-09-12 – 2011-09-13 (×2): 40 mg via ORAL
  Filled 2011-09-12 (×2): qty 1

## 2011-09-12 MED ORDER — SODIUM CHLORIDE 0.9 % IV SOLN
Freq: Once | INTRAVENOUS | Status: AC
  Administered 2011-09-12: 16:00:00 via INTRAVENOUS

## 2011-09-12 NOTE — Progress Notes (Signed)
[id number] 

## 2011-09-12 NOTE — Progress Notes (Signed)
NAMEBRECK, [name] NO.:  [id number]  MEDICAL RECORD NO.:  [id number]  LOCATION:  P543                          FACILITY:  [facility]  PHYSICIAN:  Unk Lightning, MDDATE OF BIRTH:  10-Jun-1921  DATE OF PROCEDURE: DATE OF DISCHARGE:                                PROGRESS NOTE   Status post diverticular bleed.  Hemoglobin down to 7.9.  The patient is to be cared for her ailing husband physically.  PHYSICAL EXAMINATION:  VITAL SIGNS:  Temperature 98.7, pulse is 97 and regular, respiratory rate is 16, blood pressure 133/77, O2 sat is 94%. NECK:  No JVD.  No carotid bruits.  No thyromegaly. LUNGS:  Clear to A and P.  No rales, wheezes, or rhonchi. HEART:  Regular rhythm.  No murmurs, gallops, or rubs.  LABORATORY DATA:  Hemoglobin 7.9, potassium 3.6, BUN 10, creatinine 0.93.  The plan right now is that the patient still feels weak.  She has some symptomatic anemia from diverticular bleed.  The plan is to transfuse 2 additional units of packed cells as the patient has strong enough to carry out her activities of daily living at home.  We will obtain post- transfusion hemoglobin/hematocrit and consider discharge tomorrow.  She is currently on Protonix 40 mg p.o. daily.     Unk Lightning, MD     RMD/MEDQ  D:  09/12/2011  T:  09/12/2011  Job:  [id number]

## 2011-09-12 NOTE — Progress Notes (Signed)
Subjective: Colonoscopy 9/25 with scattered diverticulosis, adherent clot noted to one tic, s/p 2 clips applied to base. Internal hemorrhoids. Likely GI bleed diverticular in origin. Pt sitting up in bed eating breakfast. No n/v. No further evidence of GI bleed. Says throat is sore, but otherwise doing well. No abdominal pain. Denies weakness, fatigue. Spent 15-20 minutes talking with patient about colonoscopy; multiple questions.  Objective: Vital signs in last 24 hours: Temp:  [97.6 F (36.4 C)-98.7 F (37.1 C)] 98.7 F (37.1 C) (09/26 0538) Pulse Rate:  [78-137] 97  (09/26 0538) Resp:  [13-25] 16  (09/26 0538) BP: (89-133)/(38-89) 133/77 mmHg (09/26 0538) SpO2:  [91 %-100 %] 94 % (09/26 0538) Last BM Date: 09/11/11 General:   Alert and oriented, pleasant Head:  Normocephalic and atraumatic. Eyes:  No icterus, sclera clear. Conjuctiva pink.  Mouth:  Without lesions, mucosa pink and moist.  Heart:  S1, S2 present, no murmurs noted.  Lungs: Clear to auscultation bilaterally, without wheezing, rales, or rhonchi.  Abdomen:  Bowel sounds present, soft, non-tender, non-distended. No HSM or hernias noted. No rebound or guarding. No masses appreciated  Msk:  Symmetrical without gross deformities. Normal posture. Extremities:  Without clubbing or edema. Neurologic:  Alert and  oriented x4;  grossly normal neurologically. Skin:  Warm and dry, intact without significant lesions.  Psych:  Alert and cooperative. Normal mood and affect.  Intake/Output from previous day: 09/25 0701 - 09/26 0700 In: 4163.3 [P.O.:240; I.V.:3923.3] Out: 426 [Urine:425; Stool:1] Intake/Output this shift:    Lab Results:  Basename 09/12/11 0611 09/12/11 0012 09/11/11 1744 09/10/11 1230  WBC -- -- -- 9.7  HGB 7.9* 8.2* 8.4* --  HCT 23.8* 24.2* 25.3* --  PLT -- -- -- 195   BMET  Basename 09/12/11 0611 09/11/11 0528 09/10/11 1230  NA 139 136 137  K 3.6 3.4* 4.6  CL 108 102 101  CO2 24 26 28   GLUCOSE 85  84 102*  BUN 10 16 17   CREATININE 0.93 1.05 1.08  CALCIUM 8.0* 8.3* 9.5   LFT  Basename 09/12/11 0611 09/11/11 0528 09/10/11 1230  PROT 5.5* 5.6* 6.7  ALBUMIN 2.8* 2.9* 3.3*  AST 34 27 21  ALT 17 15 14   ALKPHOS 59 57 62  BILITOT 0.3 0.4 0.4  BILIDIR 0.1 0.1 --  IBILI 0.2* 0.3 --   PT/INR  Basename 09/12/11 0611 09/11/11 0528  LABPROT 14.4 14.3  INR 1.10 1.09    Studies/Results: Dg Chest 2 View  09/10/2011  *RADIOLOGY REPORT*  Clinical Data: Question pneumonia.  Blood in stool.  CHEST - 2 VIEW  Comparison: CT chest 06/14/2011 and chest radiograph 03/05/2011.  Findings: Trachea is midline.  Heart size normal.  Thoracic aorta is calcified.  Left lower lobe scarring.  Scarring is also seen in the right middle lobe and lingula.  Lungs are otherwise clear.  No pleural fluid.  Old left rib fractures.  IMPRESSION: Bibasilar scarring.  Original Report Authenticated By: Luretha Rued, M.D.    Assessment: 77 year old female with GI bleed likely secondary to diverticular bleed. Colonoscopy 9/25, scattered diverticulosis with adherent clot to one tic, likely culprit. 2 clips placed. Internal hemorrhoids. No further evidence of GI bleed. Slight drop in Hgb from 8.4 yesterday evening, 8.2 around 12am, 7.9 this morning. Hemodilution may be playing a role; as noted earlier, no signs of GI bleed. Will recheck Hgb at 12pm today. Pt is asymptomatic.    Plan: Switch Protonix IV to po Recheck Hgb at 12 noon today  Consider transfusion if further drops in Hgb Follow-up outpatient in office If remains stable, anticipate d/c in next 24-48 hours at most NS KVO, pt tolerating diet, po    LOS: 2 days   Laban Emperor  09/12/2011, 8:30 AM

## 2011-09-12 NOTE — Progress Notes (Signed)
OPV w/ Dr. Gala Romney in 2 mos.

## 2011-09-13 LAB — BASIC METABOLIC PANEL
Calcium: 8.2 mg/dL — ABNORMAL LOW (ref 8.4–10.5)
GFR calc Af Amer: >60 mL/min (ref >60)
GFR calc non Af Amer: >60 mL/min (ref >60)
Glucose, Bld: 83 mg/dL (ref 70–99)
Potassium: 3.5 mEq/L (ref 3.5–5.1)
Sodium: 139 mEq/L (ref 135–145)

## 2011-09-13 LAB — HEPATIC FUNCTION PANEL
AST: 30 U/L (ref 0–37)
Albumin: 2.7 g/dL — ABNORMAL LOW (ref 3.5–5.2)
Bilirubin, Direct: 0.1 mg/dL (ref 0.0–0.3)
Total Bilirubin: 0.6 mg/dL (ref 0.3–1.2)

## 2011-09-13 LAB — PREPARE RBC (CROSSMATCH)

## 2011-09-13 LAB — PROTIME-INR
INR: 1.03 (ref 0.00–1.49)
Prothrombin Time: 13.7 seconds (ref 11.6–15.2)

## 2011-09-13 LAB — HEMOGLOBIN AND HEMATOCRIT, BLOOD
HCT: 30.3 % — ABNORMAL LOW (ref 36.0–46.0)
Hemoglobin: 10.4 g/dL — ABNORMAL LOW (ref 12.0–15.0)

## 2011-09-13 NOTE — Discharge Summary (Signed)
NAMENABRIA, NEVIN NO.:  [id number]  MEDICAL RECORD NO.:  [id number]  LOCATION:  M546                          FACILITY:  [facility]  PHYSICIAN:  Unk Lightning, MDDATE OF BIRTH:  Oct 21, 1921  DATE OF ADMISSION:  09/10/2011 DATE OF DISCHARGE:  LH                              DISCHARGE SUMMARY   The patient is an 77 year old white female with a history of hyperlipidemia, mild hypertension, osteoporosis, degenerative joint disease, history of breast carcinoma rectified with lumpectomy and lung carcinoma 1 year ago status post radiation therapy, which is terminated. The patient had episodes of hematochezia, 3 bowls filled this morning. She seemed pallor, had some diaphoresis and was sent to the hospital. She was admitted, typed and crossed for 2 units of packed cells. Surprisingly, initial hemoglobin was 10.1.  She then drifted down and subsequent hemoglobin is 7.9.  She was subsequently transfused 2 units of packed cells, had a GI consultation and placed on clear liquid diet. She remained hemodynamically stable throughout this time.  Denied anginal equivalents, syncope, palpitations.  Her NSAIDs which I do not believe she was taking were terminated.  She was placed on Protonix 40 p.o. daily.  Gastroenterology, Dr. Oneida Alar decided on a colonoscopy and an EGD, and colonoscopy revealed scattered diverticuli with a clotted urine to one of the diverticuli presumed to be the suspected culprit for the bleed.  She had 2 metal clips placed on 2 adjacent diverticuli and subsequently had a hemoglobin of 7.9.  She was transfused an additional 2 units of packed cells and she needs to be vigorous to take care for her husband.  She was subsequently discharged on the following medicines: 1. Aspirin 81 mg p.o. daily. 2. Atenolol 25 mg p.o. daily. 3. Calcium carbonate 600, 400 plus D one p.o. daily. 4. Ferrous sulfate, Slow iron 160 p.o. daily. 5. Folic acid 1 mg p.o.  daily. 6. Lorazepam 0.5 mg p.o. at bedtime p.r.n. 7. Multivitamin 1 p.o. daily. 8. Niacin, Slo-Niacin 500 mg p.o. nightly. 9. Prilosec 20 mg p.o. daily.  She will follow up in the office in 3-5 days' time to check hemoglobin/hematocrit, any symptoms of bleed and we will make further recommendations at that time.     Unk Lightning, MD     RMD/MEDQ  D:  09/13/2011  T:  09/13/2011  Job:  [id number]

## 2011-09-13 NOTE — Progress Notes (Signed)
Patient d/c home with family Verbalized understanding of d/c instructions and follow up appts No c/o pain at d/c  Left floor via wheelchair accompanied by staff and family Gerrianne Aydelott, Tivis Ringer

## 2011-09-13 NOTE — Progress Notes (Signed)
Results Cc to PCP  

## 2011-09-13 NOTE — Progress Notes (Signed)
I called pt to tell her that we needed to make OV in 3 months to see RMR and the only time available was 12/21 at 0830 with RMR. Pt said that was too early and she could not get here that early. I explained that was all that was available and it may be January before I could get her in to see RMR. Pt already had OV on 12/6 to see AS and said she would keep that instead. Please advise.

## 2011-09-13 NOTE — Discharge Summary (Signed)
[id number] 

## 2011-09-14 ENCOUNTER — Telehealth: Payer: Self-pay

## 2011-09-14 ENCOUNTER — Encounter (HOSPITAL_COMMUNITY): Payer: Self-pay | Admitting: Gastroenterology

## 2011-09-14 LAB — TYPE AND SCREEN
ABO/RH(D): B POS
Unit division: 0
Unit division: 0

## 2011-09-14 NOTE — Telephone Encounter (Signed)
Please call pt. If it's a small amount of bleeding, she should rest for the day and not do any strenuous activity. If it's a large amount of blood she should come to the ED.

## 2011-09-14 NOTE — Telephone Encounter (Signed)
Spoke to pt's CNA. 1st episode of rectal bleeding: clots 2nd episode: less bleeding, & no clots. Explained to CNA it can happen after a large bleed. If bleeding continues and get worse, pt should go to ED. CNA will call back with questions.

## 2011-09-14 NOTE — Telephone Encounter (Signed)
pts CNA- [employer] called- pt had a BM yesterday and everything was ok, when she had a BM this morning she had blood in the toilet and a clot on the toilet paper. Amber stated the blood in the toilet was bright red but the clot on the toilet paper was dark. Pt is having no pain, no fever. They are concerned and want to know if there is anything they need to do. Please advise.

## 2011-09-18 LAB — COMPREHENSIVE METABOLIC PANEL
ALT: 15
AST: 21
Albumin: 2.6 — ABNORMAL LOW
Alkaline Phosphatase: 49
Calcium: 7.2 — ABNORMAL LOW
GFR calc Af Amer: >60
Glucose, Bld: 97
Potassium: 2.9 — ABNORMAL LOW
Sodium: 139
Total Protein: 5 — ABNORMAL LOW

## 2011-09-21 LAB — CBC
HCT: 30.1 — ABNORMAL LOW
HCT: 31.5 — ABNORMAL LOW
Hemoglobin: 10.7 — ABNORMAL LOW
MCHC: 33.8
MCHC: 34.1
MCV: 97
Platelets: 212
RBC: 3.24 — ABNORMAL LOW
RDW: 15.4
RDW: 15.7 — ABNORMAL HIGH

## 2011-09-21 LAB — CREATININE, SERUM
Creatinine, Ser: 0.99
GFR calc non Af Amer: 53 — ABNORMAL LOW

## 2011-09-21 LAB — PROTIME-INR
INR: 0.9
INR: 0.9
Prothrombin Time: 12.6

## 2011-09-21 LAB — DIFFERENTIAL
Basophils Relative: 0
Eosinophils Absolute: 0 — ABNORMAL LOW
Eosinophils Relative: 0
Neutrophils Relative %: 91 — ABNORMAL HIGH

## 2011-09-24 LAB — DIFFERENTIAL
Basophils Absolute: 0
Eosinophils Relative: 1
Lymphocytes Relative: 26
Monocytes Absolute: 0.8
Monocytes Relative: 14 — ABNORMAL HIGH
Neutro Abs: 3.4

## 2011-09-24 LAB — CBC
HCT: 31.9 — ABNORMAL LOW
Hemoglobin: 10.8 — ABNORMAL LOW
RBC: 3.32 — ABNORMAL LOW
RDW: 17.1 — ABNORMAL HIGH

## 2011-09-24 LAB — CANCER ANTIGEN 27.29: CA 27.29: 38

## 2011-09-25 LAB — COMPREHENSIVE METABOLIC PANEL
Alkaline Phosphatase: 119 — ABNORMAL HIGH
BUN: 17
CO2: 27
Chloride: 102
Creatinine, Ser: 1.18
GFR calc non Af Amer: 44 — ABNORMAL LOW
Glucose, Bld: 128 — ABNORMAL HIGH
Potassium: 3.8
Total Bilirubin: 0.9

## 2011-09-27 LAB — COMPREHENSIVE METABOLIC PANEL
ALT: 54 — ABNORMAL HIGH
AST: 74 — ABNORMAL HIGH
Albumin: 2.8 — ABNORMAL LOW
CO2: 29
Chloride: 102
Creatinine, Ser: 1.2
GFR calc Af Amer: 52 — ABNORMAL LOW
Potassium: 4
Sodium: 131 — ABNORMAL LOW
Total Bilirubin: 0.9

## 2011-09-27 LAB — DIFFERENTIAL
Basophils Absolute: 0
Eosinophils Absolute: 0.1
Eosinophils Relative: 3
Lymphocytes Relative: 32
Monocytes Absolute: 0.6

## 2011-09-27 LAB — CBC
MCV: 94
Platelets: 141 — ABNORMAL LOW
RBC: 3.59 — ABNORMAL LOW
WBC: 4.6

## 2011-09-27 LAB — CANCER ANTIGEN 27.29: CA 27.29: 39

## 2011-10-01 LAB — DIFFERENTIAL
Basophils Absolute: 0
Basophils Absolute: 0
Basophils Absolute: 0
Basophils Absolute: 0
Basophils Relative: 0
Basophils Relative: 0
Basophils Relative: 1
Eosinophils Absolute: 0
Eosinophils Absolute: 0.1
Eosinophils Absolute: 0.1
Eosinophils Relative: 1
Eosinophils Relative: 1
Lymphocytes Relative: 37
Monocytes Absolute: 0.3
Monocytes Absolute: 0.5
Monocytes Absolute: 0.7
Monocytes Relative: 12 — ABNORMAL HIGH
Neutro Abs: 3
Neutrophils Relative %: 55
Neutrophils Relative %: 57

## 2011-10-01 LAB — BASIC METABOLIC PANEL
BUN: 13
BUN: 16
CO2: 24
CO2: 26
CO2: 27
Calcium: 8.2 — ABNORMAL LOW
Calcium: 9
Chloride: 98
Creatinine, Ser: 1.1
GFR calc non Af Amer: 49 — ABNORMAL LOW
GFR calc non Af Amer: >60
Glucose, Bld: 134 — ABNORMAL HIGH
Glucose, Bld: 85
Potassium: 3.3 — ABNORMAL LOW
Sodium: 130 — ABNORMAL LOW

## 2011-10-01 LAB — CBC
HCT: 27.8 — ABNORMAL LOW
HCT: 31.9 — ABNORMAL LOW
Hemoglobin: 9.5 — ABNORMAL LOW
MCHC: 34.2
MCHC: 34.2
MCHC: 34.3
MCHC: 34.5
MCV: 94.7
MCV: 95
MCV: 95.4
Platelets: 157
Platelets: 210
RDW: 15.9 — ABNORMAL HIGH
RDW: 16 — ABNORMAL HIGH
RDW: 16.3 — ABNORMAL HIGH

## 2011-10-01 LAB — CARDIAC PANEL(CRET KIN+CKTOT+MB+TROPI)
Relative Index: INVALID
Relative Index: INVALID
Total CK: 53
Troponin I: 0.03

## 2011-10-01 LAB — CK TOTAL AND CKMB (NOT AT ARMC): CK, MB: 1.8

## 2011-10-01 LAB — POCT CARDIAC MARKERS
Myoglobin, poc: 79
Operator id: 240821

## 2011-10-01 LAB — HEPATIC FUNCTION PANEL
ALT: 33
AST: 45 — ABNORMAL HIGH
Albumin: 2.9 — ABNORMAL LOW
Indirect Bilirubin: 0.6
Total Protein: 6

## 2011-10-01 LAB — CORTISOL: Cortisol, Plasma: 18.5

## 2011-10-01 LAB — LIPID PANEL
HDL: 45
Total CHOL/HDL Ratio: 2.2
VLDL: 10

## 2011-10-01 LAB — RPR: RPR Ser Ql: NONREACTIVE

## 2011-10-01 LAB — 5 HIAA, QUANTITATIVE, URINE, 24 HOUR: 5-HIAA, 24 Hr Urine: 3 mg/24 h (ref <=6.0)

## 2011-10-03 LAB — I-STAT 8, (EC8 V) (CONVERTED LAB)
BUN: 21
Bicarbonate: 27.6 — ABNORMAL HIGH
Chloride: 98
HCT: 37
Operator id: 179121
pCO2, Ven: 51.2 — ABNORMAL HIGH
pH, Ven: 7.339 — ABNORMAL HIGH

## 2011-10-11 ENCOUNTER — Encounter (HOSPITAL_COMMUNITY): Payer: Medicare Other | Attending: Oncology

## 2011-10-11 DIAGNOSIS — C3430 Malignant neoplasm of lower lobe, unspecified bronchus or lung: Secondary | ICD-10-CM

## 2011-10-11 DIAGNOSIS — C3490 Malignant neoplasm of unspecified part of unspecified bronchus or lung: Secondary | ICD-10-CM | POA: Insufficient documentation

## 2011-10-11 DIAGNOSIS — C50919 Malignant neoplasm of unspecified site of unspecified female breast: Secondary | ICD-10-CM

## 2011-10-11 DIAGNOSIS — Z853 Personal history of malignant neoplasm of breast: Secondary | ICD-10-CM

## 2011-10-11 DIAGNOSIS — M1990 Unspecified osteoarthritis, unspecified site: Secondary | ICD-10-CM

## 2011-10-11 LAB — COMPREHENSIVE METABOLIC PANEL
ALT: 15 U/L (ref 0–35)
Alkaline Phosphatase: 74 U/L (ref 39–117)
BUN: 14 mg/dL (ref 6–23)
CO2: 27 mEq/L (ref 19–32)
Chloride: 100 mEq/L (ref 96–112)
GFR calc Af Amer: 51 mL/min — ABNORMAL LOW (ref >90)
GFR calc non Af Amer: 44 mL/min — ABNORMAL LOW (ref >90)
Glucose, Bld: 115 mg/dL — ABNORMAL HIGH (ref 70–99)
Potassium: 3.9 mEq/L (ref 3.5–5.1)
Sodium: 137 mEq/L (ref 135–145)
Total Bilirubin: 0.3 mg/dL (ref 0.3–1.2)
Total Protein: 7.3 g/dL (ref 6.0–8.3)

## 2011-10-11 NOTE — Progress Notes (Signed)
Labs drawn today for cmp

## 2011-10-15 ENCOUNTER — Other Ambulatory Visit (HOSPITAL_COMMUNITY): Payer: Medicare Other

## 2011-10-16 ENCOUNTER — Ambulatory Visit (HOSPITAL_COMMUNITY)
Admission: RE | Admit: 2011-10-16 | Discharge: 2011-10-16 | Disposition: A | Discharge status: Home / Self Care | Payer: Medicare Other | Source: Ambulatory Visit | Attending: Radiation Oncology | Admitting: Radiation Oncology

## 2011-10-16 DIAGNOSIS — Z923 Personal history of irradiation: Secondary | ICD-10-CM | POA: Insufficient documentation

## 2011-10-16 DIAGNOSIS — R918 Other nonspecific abnormal finding of lung field: Secondary | ICD-10-CM | POA: Insufficient documentation

## 2011-10-16 DIAGNOSIS — C3490 Malignant neoplasm of unspecified part of unspecified bronchus or lung: Secondary | ICD-10-CM | POA: Insufficient documentation

## 2011-10-16 MED ORDER — IOHEXOL 300 MG/ML  SOLN
80.0000 mL | Freq: Once | INTRAMUSCULAR | Status: AC | PRN
  Administered 2011-10-16: 80 mL via INTRAVENOUS

## 2011-10-18 ENCOUNTER — Encounter (HOSPITAL_COMMUNITY): Payer: Medicare Other | Attending: Oncology

## 2011-10-18 DIAGNOSIS — M1990 Unspecified osteoarthritis, unspecified site: Secondary | ICD-10-CM | POA: Insufficient documentation

## 2011-10-18 DIAGNOSIS — M810 Age-related osteoporosis without current pathological fracture: Secondary | ICD-10-CM

## 2011-10-18 MED ORDER — ZOLEDRONIC ACID 4 MG/5ML IV CONC
3.0000 mg | Freq: Once | INTRAVENOUS | Status: AC
  Administered 2011-10-18: 3 mg via INTRAVENOUS
  Filled 2011-10-18: qty 3.75

## 2011-10-18 MED ORDER — SODIUM CHLORIDE 0.9 % IJ SOLN
10.0000 mL | INTRAMUSCULAR | Status: DC | PRN
  Administered 2011-10-18 (×2): 10 mL via INTRAVENOUS
  Filled 2011-10-18: qty 10

## 2011-10-18 MED ORDER — SODIUM CHLORIDE 0.9 % IV SOLN
INTRAVENOUS | Status: DC
  Administered 2011-10-18: 14:00:00 via INTRAVENOUS

## 2011-10-18 NOTE — Progress Notes (Signed)
Patient given Zometa via IVPB.  Tolerated well.  IV d/c after therapy.

## 2011-10-25 ENCOUNTER — Encounter: Payer: Self-pay | Admitting: Radiation Oncology

## 2011-10-25 ENCOUNTER — Ambulatory Visit
Admission: RE | Admit: 2011-10-25 | Discharge: 2011-10-25 | Disposition: A | Discharge status: Home / Self Care | Payer: Medicare Other | Source: Ambulatory Visit | Attending: Radiation Oncology | Admitting: Radiation Oncology

## 2011-10-25 ENCOUNTER — Telehealth: Payer: Self-pay | Admitting: *Deleted

## 2011-10-25 VITALS — BP 125/63 | HR 66 | Resp 18 | Wt 149.1 lb

## 2011-10-25 DIAGNOSIS — C3430 Malignant neoplasm of lower lobe, unspecified bronchus or lung: Secondary | ICD-10-CM | POA: Diagnosis present

## 2011-10-25 NOTE — Progress Notes (Signed)
Encounter addended by: Marye Round, MD on: 10/25/2011  3:49 PM<BR>     Documentation filed: Arn Medal VN

## 2011-10-25 NOTE — Progress Notes (Signed)
PATIENT PRESENTS TO THE CLINIC TODAY UNACCOMPANIED FOR FOLLOW UP VISIT WITH DR. MOODY TO OBTAIN CT SCAN RESULTS. NO DISTRESS NOTED. PATIENT ALERT AND ORIENTED TO PERSON, PLACE, AND TIME. PATIENT RIDING IN [facility] DUE TO GENERALIZED WEAKNESS. PATIENT REPORTS SHE AMBULATES WITHOUT DIFFICULTY BUT, CAN'T GO FAR DISTANCE. PATIENT DENIES COUGH, SOB, NAUSEA OR VOMITING. PATIENT REPORTS THROAT IRRITATION RELATED TO THE EFFECT OF HER HUSBAND SMOKING FREQUENTLY IN THEIR HOME.  PATIENT REPORTS SHE IS SCHEDULED TO SEE DR. Tressie Stalker IN January. PATIENT REPORTS A SITTER IS IN THE DAILY FROM 0800-1400.

## 2011-10-25 NOTE — Progress Notes (Signed)
CC:   Nicanor Alcon, M.D. Gaston Islam. Neijstrom, MD  DIAGNOSIS:  Stage I nonsmall cell lung cancer of the left lower lobe, status post radiosurgery.  NARRATIVE:  Ms. Mastrangelo returns to clinic today for followup.  She states that she has done fairly well.  She does continue to take care of her husband, and she has increased the amount of help that she has available for this.  The patient notes no major change in shortness of breath, although she does have ongoing fatigue.  She had a CT scan of the chest completed on 10/16/2011.  This showed postradiation changes within the left lung base with no residual mass-like area in this region.  There was a new nodular 8 mm subpleural density in the lingula which was felt to possibly represent scarring, but tumor recurrence or metastasis could not be excluded.  Attention to this area was recommended on subsequent imaging.  PHYSICAL EXAMINATION:  Vital Signs:  Temperature 98.0, pulse 66, blood pressure 125/63, respiratory rate 20, weight 149.1 pounds.  General: Well-developed female in no acute distress sitting in a wheelchair. Neck:  Supple without any lymphadenopathy.  Cardiovascular:  Regular rate and rhythm.  Respiratory:  Clear to auscultation.  GI:  Abdomen is soft, nontender, normal bowel sounds.  Extremities:  No edema present.  IMPRESSION AND PLAN:  Ms. Romanello has done fairly well since she was last seen with no new concerning issues.  Her CT scan looked good in the area of prior treatment, although attention to 1 focal area of some possible scarring will need to be followed.  I am going to request a repeat CT scan of the chest at [facility] in 4 months with followup after this here in our clinic.  She was interested in possible ongoing followup with Dr. Tressie Stalker at [facility] since this is much closer, and we discussed possibly transitioning to this after her next followup.  I spent 15 minutes with Ms. Hearst today, the majority of which was  spent counseling her on her diagnosis and coordinating her care.    ______________________________ Jodelle Gross, M.D., Ph.D. JSM/MEDQ  D:  10/25/2011  T:  10/25/2011  Job:  [id number]

## 2011-10-26 ENCOUNTER — Other Ambulatory Visit: Payer: Self-pay | Admitting: Radiation Oncology

## 2011-10-26 ENCOUNTER — Telehealth: Payer: Self-pay | Admitting: *Deleted

## 2011-10-26 DIAGNOSIS — C3430 Malignant neoplasm of lower lobe, unspecified bronchus or lung: Secondary | ICD-10-CM

## 2011-11-12 ENCOUNTER — Encounter: Payer: Self-pay | Admitting: Internal Medicine

## 2011-11-19 ENCOUNTER — Ambulatory Visit: Payer: Medicare Other | Admitting: Internal Medicine

## 2011-11-20 ENCOUNTER — Ambulatory Visit: Payer: Medicare Other | Admitting: Internal Medicine

## 2011-11-22 ENCOUNTER — Ambulatory Visit: Payer: Medicare Other | Admitting: Gastroenterology

## 2011-12-07 ENCOUNTER — Other Ambulatory Visit (HOSPITAL_COMMUNITY): Payer: Medicare Other

## 2011-12-13 ENCOUNTER — Other Ambulatory Visit (HOSPITAL_COMMUNITY): Payer: Medicare Other

## 2011-12-18 DIAGNOSIS — H409 Unspecified glaucoma: Secondary | ICD-10-CM

## 2011-12-18 HISTORY — DX: Unspecified glaucoma: H40.9

## 2011-12-19 ENCOUNTER — Ambulatory Visit (HOSPITAL_COMMUNITY): Payer: Medicare Other | Admitting: Oncology

## 2011-12-20 DIAGNOSIS — F4323 Adjustment disorder with mixed anxiety and depressed mood: Secondary | ICD-10-CM | POA: Diagnosis not present

## 2012-01-01 ENCOUNTER — Ambulatory Visit (HOSPITAL_COMMUNITY): Payer: Medicare Other | Admitting: Oncology

## 2012-01-10 DIAGNOSIS — F4323 Adjustment disorder with mixed anxiety and depressed mood: Secondary | ICD-10-CM | POA: Diagnosis not present

## 2012-01-11 ENCOUNTER — Ambulatory Visit: Payer: Medicare Other | Admitting: Internal Medicine

## 2012-01-15 DIAGNOSIS — M1990 Unspecified osteoarthritis, unspecified site: Secondary | ICD-10-CM | POA: Diagnosis not present

## 2012-01-15 DIAGNOSIS — F411 Generalized anxiety disorder: Secondary | ICD-10-CM | POA: Diagnosis not present

## 2012-01-15 DIAGNOSIS — I110 Hypertensive heart disease with heart failure: Secondary | ICD-10-CM | POA: Diagnosis not present

## 2012-01-15 DIAGNOSIS — M1A00X0 Idiopathic chronic gout, unspecified site, without tophus (tophi): Secondary | ICD-10-CM | POA: Diagnosis not present

## 2012-02-08 NOTE — Telephone Encounter (Signed)
xxx

## 2012-03-03 ENCOUNTER — Encounter (HOSPITAL_COMMUNITY): Payer: Medicare Other | Attending: Oncology

## 2012-03-03 DIAGNOSIS — K7689 Other specified diseases of liver: Secondary | ICD-10-CM | POA: Diagnosis not present

## 2012-03-03 DIAGNOSIS — IMO0002 Reserved for concepts with insufficient information to code with codable children: Secondary | ICD-10-CM | POA: Insufficient documentation

## 2012-03-03 DIAGNOSIS — F329 Major depressive disorder, single episode, unspecified: Secondary | ICD-10-CM | POA: Diagnosis not present

## 2012-03-03 DIAGNOSIS — C50919 Malignant neoplasm of unspecified site of unspecified female breast: Secondary | ICD-10-CM

## 2012-03-03 DIAGNOSIS — M1990 Unspecified osteoarthritis, unspecified site: Secondary | ICD-10-CM | POA: Diagnosis not present

## 2012-03-03 DIAGNOSIS — Z85118 Personal history of other malignant neoplasm of bronchus and lung: Secondary | ICD-10-CM | POA: Insufficient documentation

## 2012-03-03 DIAGNOSIS — F3289 Other specified depressive episodes: Secondary | ICD-10-CM | POA: Insufficient documentation

## 2012-03-03 DIAGNOSIS — M810 Age-related osteoporosis without current pathological fracture: Secondary | ICD-10-CM | POA: Insufficient documentation

## 2012-03-03 DIAGNOSIS — Z96649 Presence of unspecified artificial hip joint: Secondary | ICD-10-CM | POA: Insufficient documentation

## 2012-03-03 DIAGNOSIS — Z09 Encounter for follow-up examination after completed treatment for conditions other than malignant neoplasm: Secondary | ICD-10-CM | POA: Insufficient documentation

## 2012-03-03 DIAGNOSIS — Z853 Personal history of malignant neoplasm of breast: Secondary | ICD-10-CM | POA: Diagnosis not present

## 2012-03-03 LAB — COMPREHENSIVE METABOLIC PANEL
Alkaline Phosphatase: 94 U/L (ref 39–117)
BUN: 16 mg/dL (ref 6–23)
Calcium: 10.5 mg/dL (ref 8.4–10.5)
GFR calc Af Amer: 55 mL/min — ABNORMAL LOW (ref >90)
GFR calc non Af Amer: 48 mL/min — ABNORMAL LOW (ref >90)
Glucose, Bld: 84 mg/dL (ref 70–99)
Potassium: 4.3 mEq/L (ref 3.5–5.1)
Total Protein: 7.9 g/dL (ref 6.0–8.3)

## 2012-03-03 LAB — CBC
HCT: 36.6 % (ref 36.0–46.0)
MCHC: 32.5 g/dL (ref 30.0–36.0)
Platelets: 243 10*3/uL (ref 150–400)
RDW: 13.4 % (ref 11.5–15.5)
WBC: 6.3 10*3/uL (ref 4.0–10.5)

## 2012-03-03 LAB — DIFFERENTIAL
Eosinophils Absolute: 0.1 10*3/uL (ref 0.0–0.7)
Eosinophils Relative: 2 % (ref 0–5)
Lymphocytes Relative: 15 % (ref 12–46)
Monocytes Relative: 10 % (ref 3–12)
Neutro Abs: 4.6 10*3/uL (ref 1.7–7.7)
Neutrophils Relative %: 73 % (ref 43–77)

## 2012-03-05 ENCOUNTER — Encounter: Payer: Self-pay | Admitting: *Deleted

## 2012-03-05 ENCOUNTER — Ambulatory Visit (HOSPITAL_COMMUNITY)
Admission: RE | Admit: 2012-03-05 | Discharge: 2012-03-05 | Disposition: A | Discharge status: Home / Self Care | Payer: Medicare Other | Source: Ambulatory Visit | Attending: Radiation Oncology | Admitting: Radiation Oncology

## 2012-03-05 ENCOUNTER — Other Ambulatory Visit (HOSPITAL_COMMUNITY): Payer: Medicare Other

## 2012-03-05 DIAGNOSIS — Z85118 Personal history of other malignant neoplasm of bronchus and lung: Secondary | ICD-10-CM | POA: Diagnosis not present

## 2012-03-05 DIAGNOSIS — Z923 Personal history of irradiation: Secondary | ICD-10-CM | POA: Insufficient documentation

## 2012-03-05 DIAGNOSIS — C3430 Malignant neoplasm of lower lobe, unspecified bronchus or lung: Secondary | ICD-10-CM

## 2012-03-05 MED ORDER — IOHEXOL 300 MG/ML  SOLN
80.0000 mL | Freq: Once | INTRAMUSCULAR | Status: AC | PRN
  Administered 2012-03-05: 80 mL via INTRAVENOUS

## 2012-03-06 ENCOUNTER — Encounter: Payer: Self-pay | Admitting: *Deleted

## 2012-03-06 ENCOUNTER — Other Ambulatory Visit (HOSPITAL_COMMUNITY): Payer: Medicare Other

## 2012-03-06 DIAGNOSIS — C50919 Malignant neoplasm of unspecified site of unspecified female breast: Secondary | ICD-10-CM | POA: Insufficient documentation

## 2012-03-07 ENCOUNTER — Encounter: Payer: Self-pay | Admitting: Radiation Oncology

## 2012-03-07 ENCOUNTER — Ambulatory Visit
Admission: RE | Admit: 2012-03-07 | Discharge: 2012-03-07 | Disposition: A | Discharge status: Home / Self Care | Payer: Medicare Other | Source: Ambulatory Visit | Attending: Radiation Oncology | Admitting: Radiation Oncology

## 2012-03-07 VITALS — BP 127/66 | HR 65 | Temp 96.9°F | Resp 18 | Wt 155.0 lb

## 2012-03-07 DIAGNOSIS — C3430 Malignant neoplasm of lower lobe, unspecified bronchus or lung: Secondary | ICD-10-CM | POA: Diagnosis not present

## 2012-03-07 NOTE — Progress Notes (Signed)
Patient presents to the clinic today unaccompanied for a follow up appointment with Dr. Lisbeth Renshaw to review recent CT scan. Patient is alert and oriented to person, place, and time. No distress noted. Patient being pushed in wheelchair but generally walks with a three point cane. Pleasant affect noted. Patient denies pain at this time. Patient reports a decreased appetite saying "i force myself to eat." Patient reports that the end of December she "hit rock bottom" assuming it was fatigue she saw her physician. She was placed on Rimeron and her family got a sitter to care for her husband around the clock. Patient reports she is doing well now. Patient reports she is not feeling depressed or fatigued. Patient schedule to see her PCP next week for a follow up appointment. Patient denies headache, dizziness, nausea, vomiting, or diarrhea. Patient denies cough or shortness of breath. Reported all findings to Dr. Lisbeth Renshaw.

## 2012-03-09 NOTE — Progress Notes (Signed)
Radiation Oncology         [phone] ________________________________  Name: [name] MRN: [id number]  Date: 03/09/2012  DOB: October 16, 1921  Follow-Up Visit Note  CC: Maricela Curet, MD, MD  Nicanor Alcon, MD  Diagnosis:   Non-small cell lung cancer of the left lower lobe, stage I  Narrative:  The patient returns today for routine follow-up.  The patient indicates that she is doing well clinically at this time. The patient notes that she believes that she did have some depression and she has started on Remeron. She states that she hit right bottom in December but she is doing much better. The patient does have some stressors in her life including having to take care of her sick husband. She denies any difficulties in terms of worsening shortness of breath or cough. She also denies any fever. The patient had a recent CT scan of the chest. This showed some chronic postradiation changes in the left lower lobe without any clear evidence of progression.                             ALLERGIES:  is allergic to ibuprofen and statins.  Meds: Current Outpatient Prescriptions  Medication Sig Dispense Refill  . aspirin 81 MG EC tablet Take 81 mg by mouth daily.        Marland Kitchen atenolol (TENORMIN) 25 MG tablet TAKE (1/2) TABLET BY     MOUTH DAILY.  15 tablet  6  . Calcium Carbonate-Vitamin D (CALTRATE 600+D) 600-400 MG-UNIT per tablet Take 1 tablet by mouth daily.        . ferrous sulfate dried (SLOW FE) 160 (50 FE) MG TBCR Take 160 mg by mouth daily.        . folic acid (FOLVITE) 1 MG tablet Take 1 mg by mouth daily.        . folic acid (FOLVITE) 350 MCG tablet Take 400 mcg by mouth daily.        . mirtazapine (REMERON) 15 MG tablet Take 7.5 mg by mouth at bedtime.      . Multiple Vitamin (MULTIVITAMIN) capsule Take 1 capsule by mouth daily.        . niacin (SLO-NIACIN) 500 MG tablet Take 500 mg by mouth daily.        Marland Kitchen omeprazole (PRILOSEC) 20 MG capsule Take 20 mg by mouth daily.        Marland Kitchen  LORazepam (ATIVAN) 0.5 MG tablet Take 0.5 mg by mouth at bedtime as needed. For sleep         Physical Findings: The patient is in no acute distress. Patient is alert and oriented.  weight is 155 lb (70.308 kg). Her oral temperature is 96.9 F (36.1 C). Her blood pressure is 127/66 and her pulse is 65. Her respiration is 18. .   General: Well-developed female in no acute distress Neck: Supple without any lymphadenopathy Cardiovascular: Regular rate and rhythm Respiratory: Clear to auscultation bilaterally GI: Soft, nontender, normal bowel sounds Extremities: No edema present Neuro: No focal deficits    Lab Findings: Lab Results  Component Value Date   WBC 6.3 03/03/2012   HGB 11.9* 03/03/2012   HCT 36.6 03/03/2012   MCV 96.6 03/03/2012   PLT 243 03/03/2012    @LASTCHEM @  Radiographic Findings: Ct Chest W Contrast  03/05/2012  *RADIOLOGY REPORT*  Clinical Data: History of lung cancer status post radiation therapy.  CT CHEST WITH CONTRAST  Technique:  Multidetector CT imaging of the chest was performed following the standard protocol during bolus administration of intravenous contrast.  Contrast: 63m OMNIPAQUE IOHEXOL 300 MG/ML IJ SOLN  Comparison: Chest CT dated 10/16/2011.  Findings:  Mediastinum: Heart size is borderline enlarged. There is no significant pericardial fluid, thickening or pericardial calcification. No pathologically enlarged mediastinal or hilar lymph nodes. Calcified right paratracheal lymph nodes are again noted.  Esophagus is unremarkable in appearance.  Mild atherosclerosis of the thoracic aorta.  No definite coronary artery calcifications.  Lungs/Pleura: Areas of scarring are again noted throughout the medial segment of the right middle lobe and in the lingula.  Areas of peripheral peribronchovascular micronodularity in the lungs bilaterally (left greater than right) are increased compared to prior studies, and are favored to represent areas of mucoid impaction within  terminal bronchioles.  Again noted is an area of architectural distortion in the left lower lobe which appears similar to multiple recent prior examinations, and is likely to represents an area of chronic postradiation changes.  No frank airspace consolidation identified on today's examination.  No pleural effusions.  Upper Abdomen: Unremarkable.  Musculoskeletal: There are no aggressive appearing lytic or blastic lesions noted in the visualized portions of the skeleton.  Old healed fractures of the anterior aspects of the left third, fourth, fifth and sixth ribs, as well as the posterior aspect of the left tenth rib are again noted. A small focal region of architectural distortion in the medial aspect of the left breast is similar to the prior examination, most consistent with a site of prior lumpectomy.  Adjacent to this there is a rim calcified lesion that likely represents a small focus of fat necrosis.  IMPRESSION: 1.  Chronic postradiation changes in the left lower lobe appears similar to prior examination, as above. 2.  Compared to the prior examination there are increasing areas of patchy, predominantly peripheral peribronchovascular micronodularity.  The appearance of this is most strongly suggestive of areas of mucoid impaction within terminal bronchioles.  Occasionally these findings can be seen in the setting of the chronic indolent atypical infectious process such as MAI (Mycobacterium avium intracellular), clinical correlation may be warranted. 3.  Mild atherosclerosis. 4.  Chronic scarring in the middle lobe and lingula again noted.  Original Report Authenticated By: DEtheleen Mayhew M.D.    Impression:  The patient is doing well clinically at this time with respect to her lung cancer. We will continue surveillance. No suspicious findings on her CT scan. The patient denies any breathing difficulties or a fever and I discussed with her the additional findings from her CT scan that commented on a  possible atypical infectious process although this was not felt to be the most likely scenario. This appears unlikely talking to her today and she knows to contact [place] has any change in symptoms.  Plan:  The patient will return to clinic in 6 months after undergoing a repeat CT scan of the chest.  I spent 15 minutes with Ms. GGignactoday, the majority of which was spent counseling her on her diagnosis of lung cancer and coronary her care.   JJodelle Gross M.D., Ph.D.

## 2012-03-11 ENCOUNTER — Telehealth (HOSPITAL_COMMUNITY): Payer: Self-pay

## 2012-03-11 ENCOUNTER — Encounter (HOSPITAL_COMMUNITY): Payer: Self-pay | Admitting: Oncology

## 2012-03-11 ENCOUNTER — Encounter (HOSPITAL_BASED_OUTPATIENT_CLINIC_OR_DEPARTMENT_OTHER): Payer: Medicare Other | Admitting: Oncology

## 2012-03-11 VITALS — BP 121/68 | HR 74 | Temp 97.4°F | Wt 153.0 lb

## 2012-03-11 DIAGNOSIS — C50919 Malignant neoplasm of unspecified site of unspecified female breast: Secondary | ICD-10-CM

## 2012-03-11 DIAGNOSIS — Z853 Personal history of malignant neoplasm of breast: Secondary | ICD-10-CM | POA: Diagnosis not present

## 2012-03-11 DIAGNOSIS — M810 Age-related osteoporosis without current pathological fracture: Secondary | ICD-10-CM | POA: Diagnosis not present

## 2012-03-11 DIAGNOSIS — C3430 Malignant neoplasm of lower lobe, unspecified bronchus or lung: Secondary | ICD-10-CM

## 2012-03-11 NOTE — Patient Instructions (Signed)
[name]  [id number] 05/04/1921   [facility]  Discharge Instructions  RECOMMENDATIONS MADE BY THE CONSULTANT AND ANY TEST RESULTS WILL BE SENT TO YOUR REFERRING DOCTOR.   EXAM FINDINGS BY MD TODAY AND SIGNS AND SYMPTOMS TO REPORT TO CLINIC OR PRIMARY MD: You are doing great.  Dr. Tressie Stalker will be talking with Dr. Lisbeth Renshaw.  If we need to do anything we will call you.  MEDICATIONS PRESCRIBED: none   INSTRUCTIONS GIVEN AND DISCUSSED: Other :  Report increased shortness of breath, bone pain, any lumps, productive cough, etc.  SPECIAL INSTRUCTIONS/FOLLOW-UP: Return to Clinic in 1 year.   I acknowledge that I have been informed and understand all the instructions given to me and received a copy. I do not have any more questions at this time, but understand that I may call the Specialty Clinic at [facility] at [phone] during business hours should I have any further questions or need assistance in obtaining follow-up care.    __________________________________________  _____________  __________ Signature of Patient or Authorized Representative            Date                   Time    __________________________________________ Nurse's Signature

## 2012-03-11 NOTE — Progress Notes (Signed)
This office note has been dictated.

## 2012-03-11 NOTE — Telephone Encounter (Signed)
Patient notified

## 2012-03-11 NOTE — Progress Notes (Signed)
CC:   Unk Lightning, MD Jodelle Gross, M.D., Ph.D. Nicanor Alcon, M.D.  DIAGNOSES: 1. Bronchoalveolar carcinoma of the left lung, stage I, left lower     lobe, status post radiation therapy by Dr. Lisbeth Renshaw in 2012.  She had     tomotherapy actually in August 2011. 2. Stage I (T1c N0 M0) infiltrating ductal carcinoma of the left     breast 1.3 cm in size, estrogen receptor positive 70%, progesterone     receptor positive 25%, Ki-67 marker low at 5%, HER-2/neu negative,     with lymphovascular invasion.  Cancer is diploid.  6 nodes were     negative.  Status post partial mastectomy on 04/03/2000 followed by     radiation therapy, followed by 5 years of her Arimidex, finishing     in May 2006, thus far without recurrence. 3. Depression over the illness of her husband, who is now 22 and     basically total care, but at home with 24-hour nurses presently. 4. Pneumonia March 2012. 5. Steatohepatitis. 6. Bilateral hip replacements, still with weakness on the right leg.     The left leg is negative. 7. Degenerative disk disease, status post epidural steroid injections. 8. DJD. 9. Scoliosis. 10.Rheumatic fever at age 68. 11.Benign right breast biopsy in 1984 and 1992. 12.Hypercholesterolemia. 13.Cholecystectomy in 1965. 14.Tape allergy. 15.Osteoporosis, on therapy with Zometa, calcium and vitamin D.  She     had a bone density in 2011. And probably needs one sometime in     2014. 16.Biceps tendon rupture, for which she has seen Dr. Arther Abbott     in the past. She states Dr. Ninfa Linden in [place] did a right hip, which did not turn out well.  The left hip was done by Dr. Aline Brochure, which was perfect.  She herself got very depressed over her husband's illness, who is 79, very debilitated, probably needs nursing home care, but she has many managing him somehow at home with 24-hour help.  She is definitely doing better on the Remeron, she states.  Her vital signs show  that her weight is 153 pounds, which is up since last year from 146.  Blood pressure is 121/68 right arm sitting position, pulse 72 and regular.  She is afebrile and denies any pain. Respirations 16 to 18 and unlabored.  She has no lymphadenopathy.  Both breasts are negative for any masses, just the changes on the 1 breast are obvious, of course.  She has no abnormal abdominal bowel sounds. She has no hepatosplenomegaly.  Her lungs show some rales at the left base.  They do not really go away.  When I reviewed her CT scan report, it states that she has changes at both lung bases consistent with possible interstitial lung disease, so I will go over that with 1 of the radiologists and see if she needs to have anything further done, but she does have some rales at the left base which do not clear with coughing.  The right lung base is clear. Her legs are without edema.  Her abdomen is not distended.  So I will see her tentatively in a year but if there is something else to be done, I will be in touch with her.    ______________________________ Gaston Islam. Tressie Stalker, MD ESN/MEDQ  D:  03/11/2012  T:  03/11/2012  Job:  [id number]

## 2012-03-11 NOTE — Telephone Encounter (Signed)
Message copied by Phylliss Bob on Tue Mar 11, 2012  5:31 PM ------      Message from: Tressie Stalker, ERIC S      Created: Tue Mar 11, 2012  4:49 PM       Call Lucielle and tell her I discussed her CT chest with the radiologist and there is nothing to be really concerned about now, which is great.

## 2012-03-13 DIAGNOSIS — J410 Simple chronic bronchitis: Secondary | ICD-10-CM | POA: Diagnosis not present

## 2012-03-13 DIAGNOSIS — M1990 Unspecified osteoarthritis, unspecified site: Secondary | ICD-10-CM | POA: Diagnosis not present

## 2012-03-13 DIAGNOSIS — J984 Other disorders of lung: Secondary | ICD-10-CM | POA: Diagnosis not present

## 2012-03-13 DIAGNOSIS — I110 Hypertensive heart disease with heart failure: Secondary | ICD-10-CM | POA: Diagnosis not present

## 2012-04-01 DIAGNOSIS — F4323 Adjustment disorder with mixed anxiety and depressed mood: Secondary | ICD-10-CM | POA: Diagnosis not present

## 2012-04-04 NOTE — Progress Notes (Signed)
Labs drawn

## 2012-04-10 DIAGNOSIS — I10 Essential (primary) hypertension: Secondary | ICD-10-CM | POA: Diagnosis not present

## 2012-04-10 DIAGNOSIS — R5381 Other malaise: Secondary | ICD-10-CM | POA: Diagnosis not present

## 2012-04-10 DIAGNOSIS — D649 Anemia, unspecified: Secondary | ICD-10-CM | POA: Diagnosis not present

## 2012-04-10 DIAGNOSIS — E785 Hyperlipidemia, unspecified: Secondary | ICD-10-CM | POA: Diagnosis not present

## 2012-04-10 DIAGNOSIS — R5383 Other fatigue: Secondary | ICD-10-CM | POA: Diagnosis not present

## 2012-05-08 DIAGNOSIS — J029 Acute pharyngitis, unspecified: Secondary | ICD-10-CM | POA: Diagnosis not present

## 2012-05-08 DIAGNOSIS — IMO0002 Reserved for concepts with insufficient information to code with codable children: Secondary | ICD-10-CM | POA: Diagnosis not present

## 2012-05-08 DIAGNOSIS — M069 Rheumatoid arthritis, unspecified: Secondary | ICD-10-CM | POA: Diagnosis not present

## 2012-05-08 DIAGNOSIS — N182 Chronic kidney disease, stage 2 (mild): Secondary | ICD-10-CM | POA: Diagnosis not present

## 2012-06-05 ENCOUNTER — Other Ambulatory Visit: Payer: Self-pay | Admitting: Cardiology

## 2012-06-24 DIAGNOSIS — J984 Other disorders of lung: Secondary | ICD-10-CM | POA: Diagnosis not present

## 2012-06-24 DIAGNOSIS — IMO0002 Reserved for concepts with insufficient information to code with codable children: Secondary | ICD-10-CM | POA: Diagnosis not present

## 2012-06-24 DIAGNOSIS — I10 Essential (primary) hypertension: Secondary | ICD-10-CM | POA: Diagnosis not present

## 2012-06-24 DIAGNOSIS — I119 Hypertensive heart disease without heart failure: Secondary | ICD-10-CM | POA: Diagnosis not present

## 2012-06-24 DIAGNOSIS — E785 Hyperlipidemia, unspecified: Secondary | ICD-10-CM | POA: Diagnosis not present

## 2012-06-24 DIAGNOSIS — M069 Rheumatoid arthritis, unspecified: Secondary | ICD-10-CM | POA: Diagnosis not present

## 2012-07-01 DIAGNOSIS — H4010X0 Unspecified open-angle glaucoma, stage unspecified: Secondary | ICD-10-CM | POA: Diagnosis not present

## 2012-07-01 DIAGNOSIS — H3530 Unspecified macular degeneration: Secondary | ICD-10-CM | POA: Diagnosis not present

## 2012-07-01 DIAGNOSIS — H43399 Other vitreous opacities, unspecified eye: Secondary | ICD-10-CM | POA: Diagnosis not present

## 2012-07-15 DIAGNOSIS — F4323 Adjustment disorder with mixed anxiety and depressed mood: Secondary | ICD-10-CM | POA: Diagnosis not present

## 2012-07-23 ENCOUNTER — Other Ambulatory Visit: Payer: Self-pay | Admitting: Cardiology

## 2012-07-29 DIAGNOSIS — H3530 Unspecified macular degeneration: Secondary | ICD-10-CM | POA: Diagnosis not present

## 2012-07-29 DIAGNOSIS — H4010X0 Unspecified open-angle glaucoma, stage unspecified: Secondary | ICD-10-CM | POA: Diagnosis not present

## 2012-08-05 DIAGNOSIS — Z1231 Encounter for screening mammogram for malignant neoplasm of breast: Secondary | ICD-10-CM | POA: Diagnosis not present

## 2012-08-05 DIAGNOSIS — Z8262 Family history of osteoporosis: Secondary | ICD-10-CM | POA: Diagnosis not present

## 2012-08-05 DIAGNOSIS — Z13820 Encounter for screening for osteoporosis: Secondary | ICD-10-CM | POA: Diagnosis not present

## 2012-08-26 DIAGNOSIS — M818 Other osteoporosis without current pathological fracture: Secondary | ICD-10-CM | POA: Diagnosis not present

## 2012-08-26 DIAGNOSIS — IMO0002 Reserved for concepts with insufficient information to code with codable children: Secondary | ICD-10-CM | POA: Diagnosis not present

## 2012-08-26 DIAGNOSIS — J410 Simple chronic bronchitis: Secondary | ICD-10-CM | POA: Diagnosis not present

## 2012-08-26 DIAGNOSIS — M1990 Unspecified osteoarthritis, unspecified site: Secondary | ICD-10-CM | POA: Diagnosis not present

## 2012-08-28 ENCOUNTER — Encounter: Payer: Self-pay | Admitting: Oncology

## 2012-09-19 IMAGING — CR DG HIP (WITH OR WITHOUT PELVIS) 2-3V*L*
4 series · 4 of 4 positions shown · non-contrast
Comparison: 05/05/2006

CLINICAL DATA: Pain post fall

LEFT HIP - COMPLETE 2+ VIEW

[view not recorded (1 of 4)]
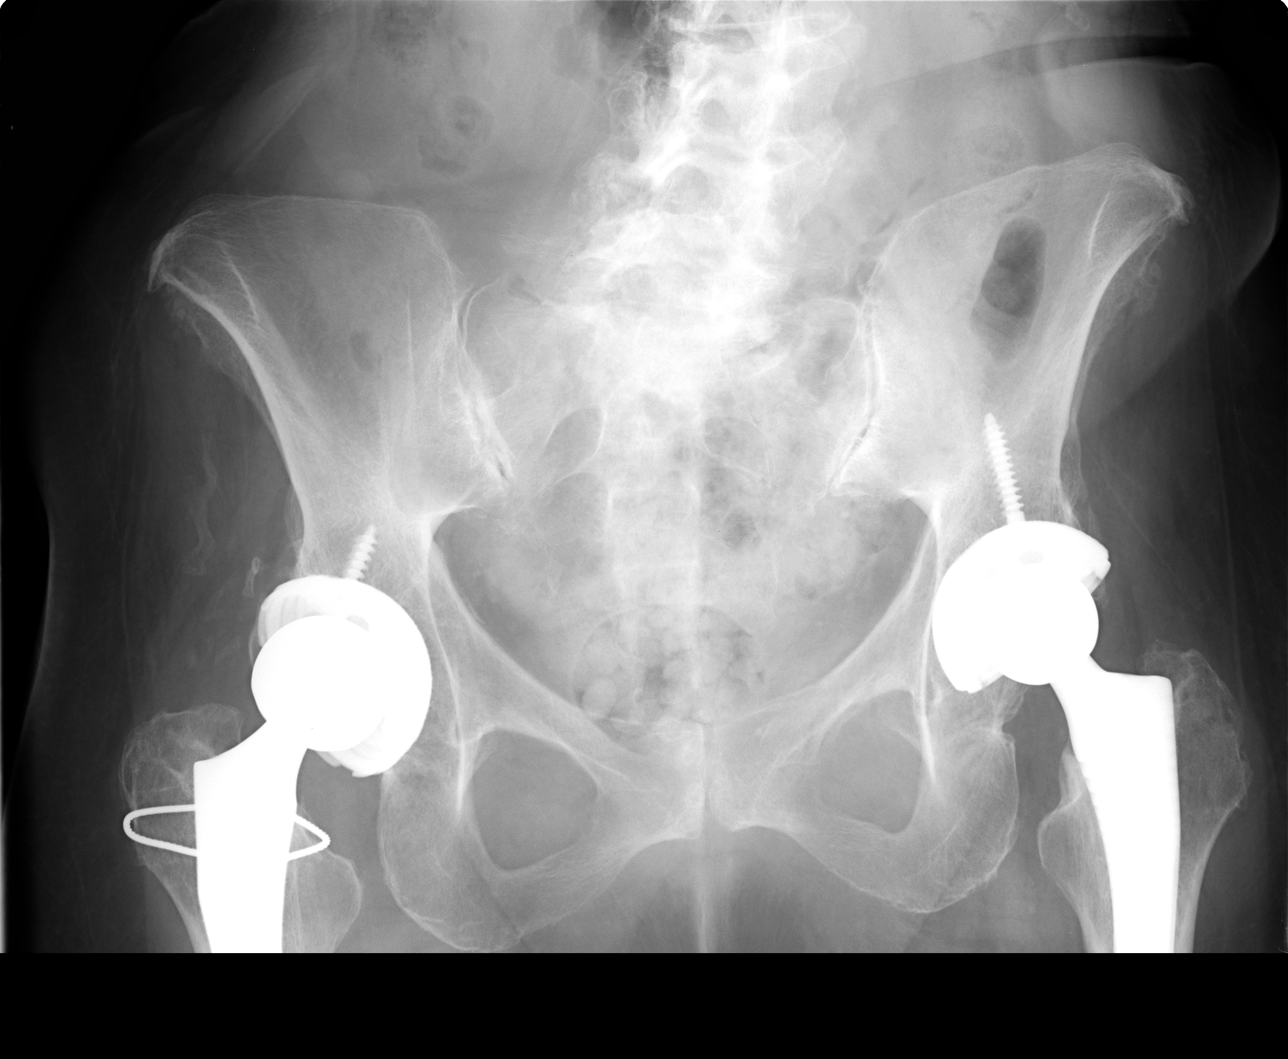

[view not recorded (2 of 4)]
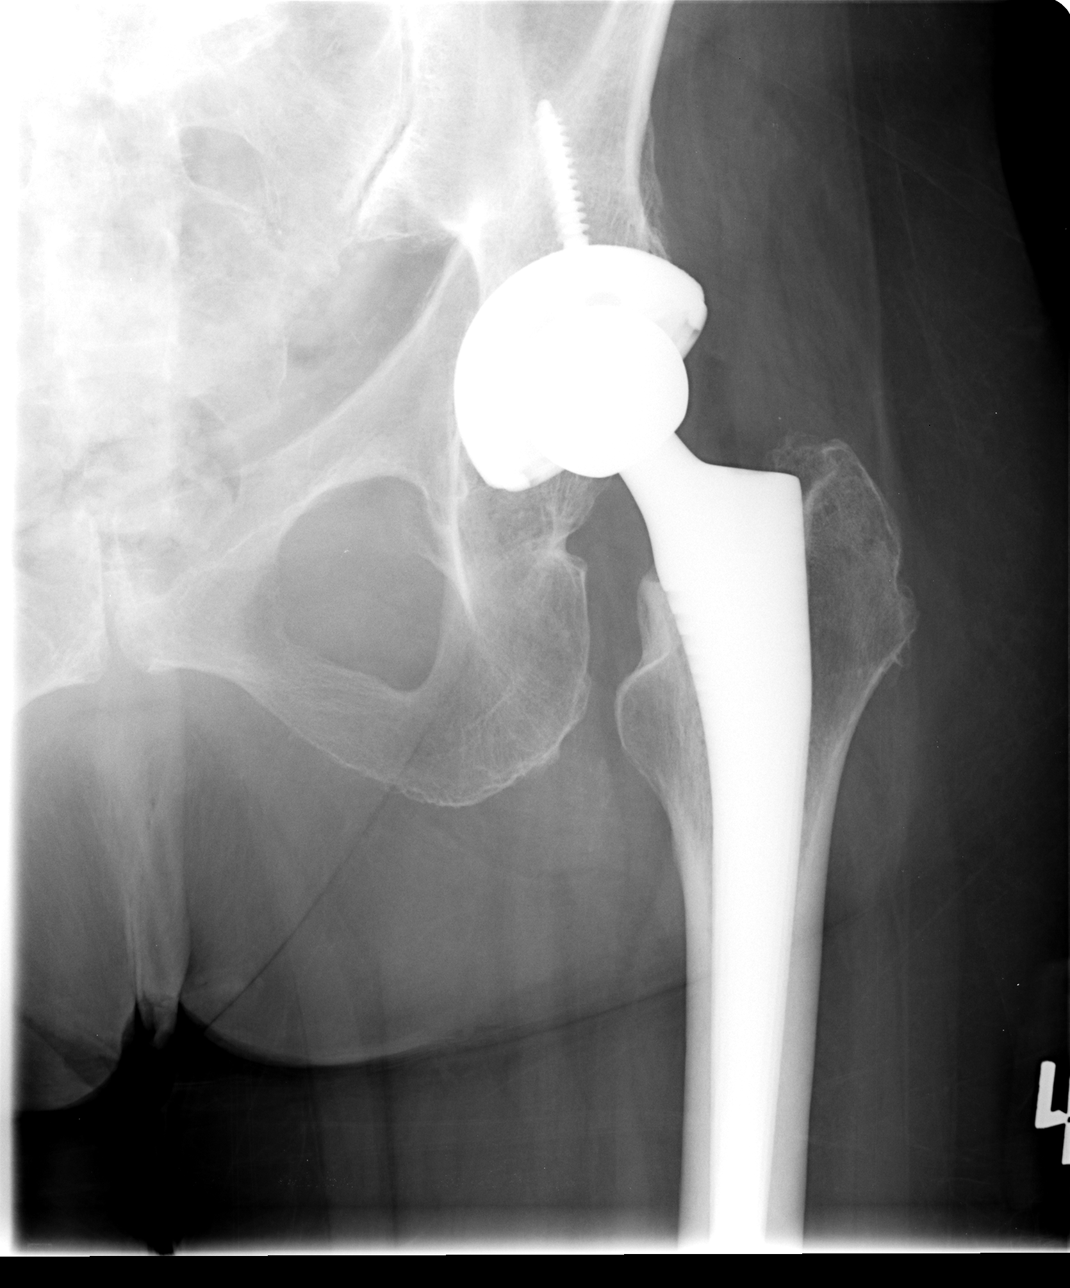

[view not recorded (3 of 4)]
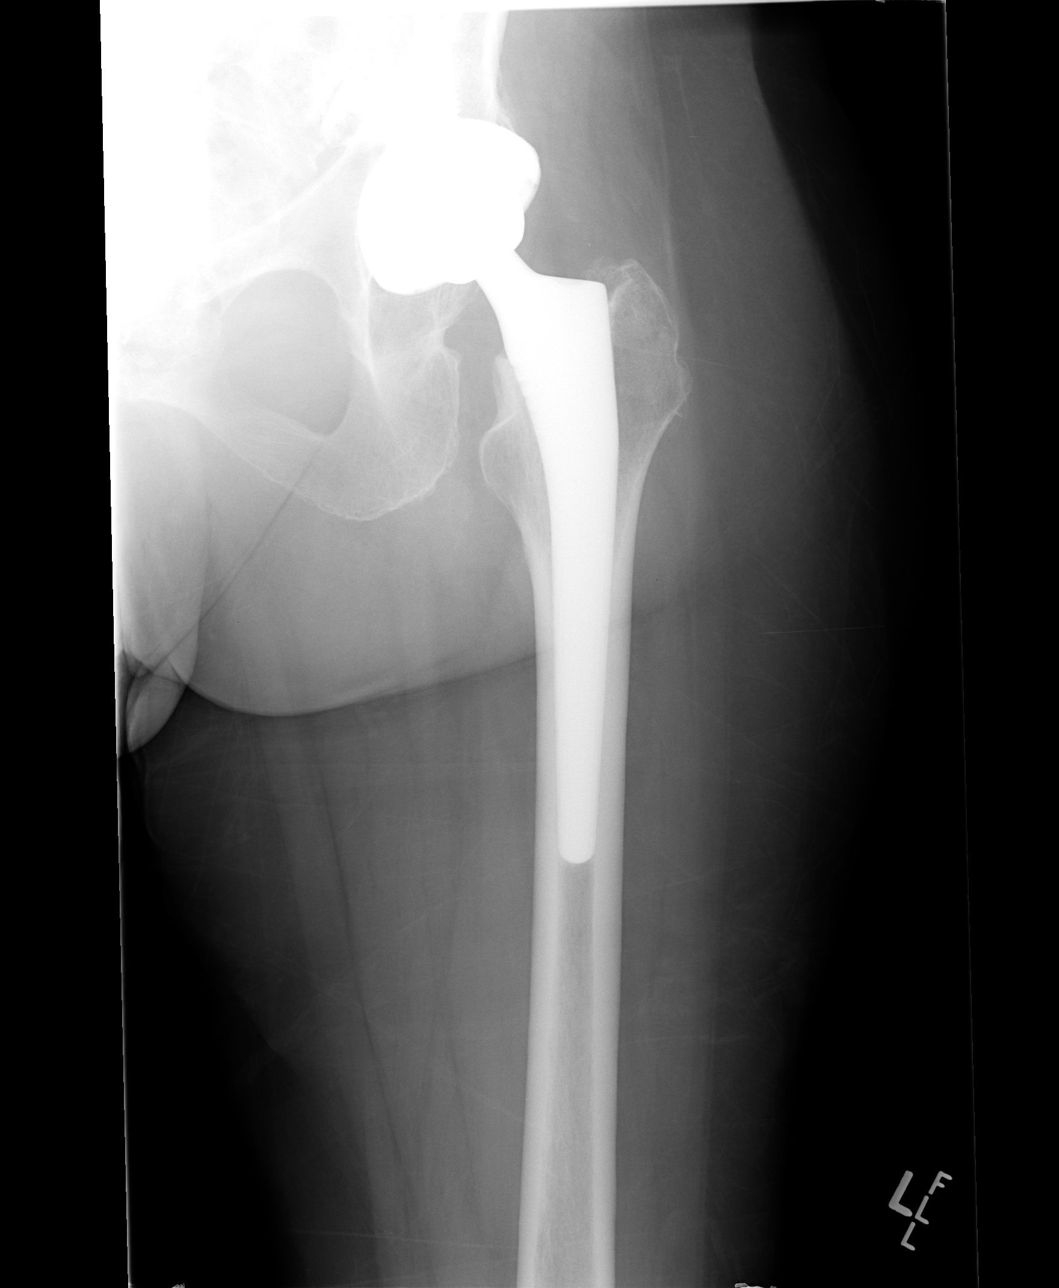

[view not recorded (4 of 4)]
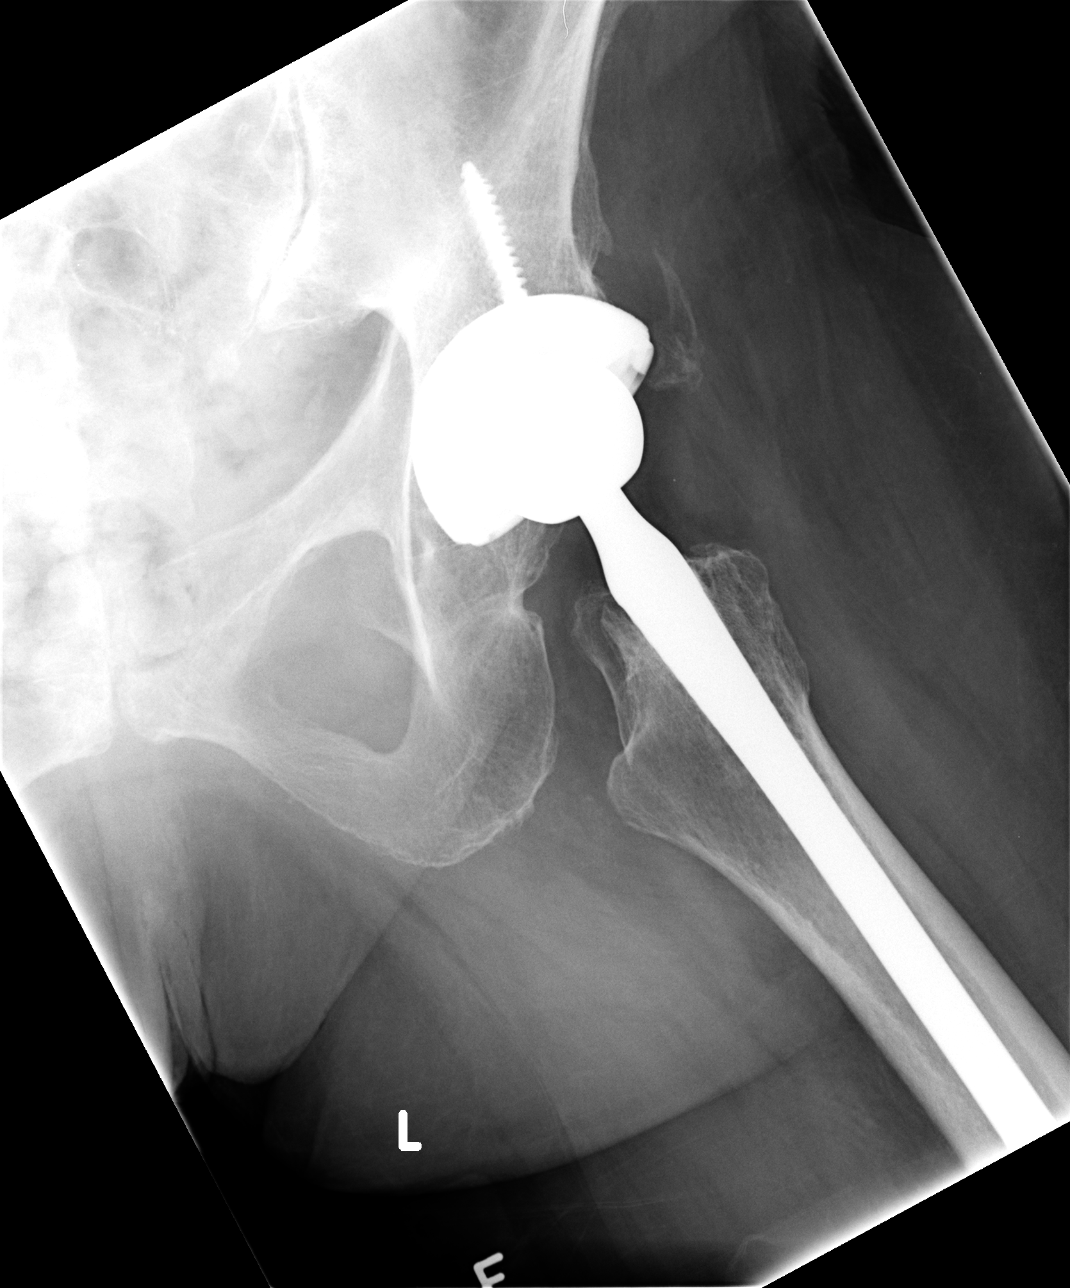

[4 of 4 positions shown; findings below may reference images not displayed]

FINDINGS: Femoral and acetabular components of left hip
arthroplasty project in expected location.  Negative for fracture,
dislocation, or other acute abnormality.
IMPRESSION: 1.  Stable left hip arthroplasty.  Negative for fracture or other
acute abnormality.

## 2012-09-30 ENCOUNTER — Encounter: Payer: Self-pay | Admitting: Orthopedic Surgery

## 2012-09-30 ENCOUNTER — Ambulatory Visit (INDEPENDENT_AMBULATORY_CARE_PROVIDER_SITE_OTHER): Payer: Medicare Other | Admitting: Orthopedic Surgery

## 2012-09-30 VITALS — BP 110/60 | Ht 64.0 in | Wt 154.0 lb

## 2012-09-30 DIAGNOSIS — M75101 Unspecified rotator cuff tear or rupture of right shoulder, not specified as traumatic: Secondary | ICD-10-CM | POA: Insufficient documentation

## 2012-09-30 DIAGNOSIS — M67919 Unspecified disorder of synovium and tendon, unspecified shoulder: Secondary | ICD-10-CM

## 2012-09-30 NOTE — Progress Notes (Signed)
Patient ID: [name], female   DOB: 1921-03-16, 78 y.o.   MRN: [id number] Chief Complaint  Patient presents with  . Shoulder Pain    right shoulder pain x 2 months, gradual onset, no known injury    This is an 78 year old female status post bilateral total hip arthroplasties and presents with right shoulder pain for the last 2 years worsening in the last month. She had injections in the past presents for reevaluation of the right shoulder with stabbing 8/10 intermittent pain worse at night and worse when sleeping on her right side. She feels a catching sensation when flexing the arm and reports some weakness  Review of systems fatigue watering of the eyes frequency unsteady gait depression easy bleeding seasonal allergies heartburn blood in the stool constipation diarrhea   Past Medical History  Diagnosis Date  . Lung cancer 05/2010    Left 2011 - rad x 3  . Rheumatic fever     Age 62  . Pneumonia     RLL with sepsis  . Biceps tendon rupture     Bilateral  . Elevated liver enzymes     Biopsy consistent with steatohepatitis  . Right thyroid nodule   . Anxiety   . Lymphedema     Left arm  . Overactive bladder   . PSVT (paroxysmal supraventricular tachycardia)   . Osteoporosis   . Osteoarthritis   . Low back pain     scoliosis  . Hyperlipidemia   . GERD (gastroesophageal reflux disease) 06/2007    EGD Dr Gala Romney >sm HH, multiple fundic gland polyps  . Type 2 diabetes mellitus   . Anemia     NOS  . Allergic rhinitis   . Insomnia   . Diverticulosis   . Hemorrhoids   . Hiatal hernia   . Steatohepatitis     liver bx  . Fundic gland polyps of stomach, benign   . Hx of radiation therapy 07/17/10 to 07/21/10    SRS LLL lung  . Breast cancer 2001    Left s/p lumpectomy and XRT   . Depression    BP 110/60  Ht 5' 4"  (1.626 m)  Wt 154 lb (69.854 kg)  BMI 26.43 kg/m2  Normal development grooming and hygiene. Awake alert and oriented x3 mood and affect normal  Ambulates with a  cane and slightly altered gait  Of her right shoulder and weakness but a negative drop arm test she has weakness to manual muscle testing shoulder is stable muscle tone is normal skin is intact there appears to be some swelling along the glenohumeral joint and tenderness along the anterolateral acromion and deltoid. Shows good pulse and normal temperature in the upper extremity with no axillary lymph nodes. Sensation is normal deep tendon reflexes normal  Impression rotator cuff syndrome possibility of rotator cuff partial tear  Recommend subacromial injection  Followup with [place] as needed. She did want the names of some doctors in [place] in case she cannot get back to [place] as she is moving.  Dr. Alvan Dame and Dr. Ricki Rodriguez were given to her as referrals.  Shoulder Injection Procedure Note   Pre-operative Diagnosis: right  RC Syndrome  Post-operative Diagnosis: same  Indications: pain   Anesthesia: ethyl chloride   Procedure Details   Verbal consent was obtained for the procedure. The shoulder was prepped withalcohol and the skin was anesthetized. A 20 gauge needle was advanced into the subacromial space through posterior approach without difficulty  The space was then injected  with 3 ml 1% lidocaine and 1 ml of depomedrol. The injection site was cleansed with isopropyl alcohol and a dressing was applied.  Complications:  None; patient tolerated the procedure well.   She has painful for elevation

## 2012-09-30 NOTE — Patient Instructions (Signed)
[facility]opa[facility] in [facility]   Dr Alvan Dame   Dr Maureen Ralphs   You have received a steroid shot. 15% of patients experience increased pain at the injection site with in the next 24 hours. This is best treated with ice and tylenol extra strength 2 tabs every 8 hours. If you are still having pain please call the office.

## 2012-10-21 DIAGNOSIS — R5383 Other fatigue: Secondary | ICD-10-CM | POA: Diagnosis not present

## 2012-10-21 DIAGNOSIS — M069 Rheumatoid arthritis, unspecified: Secondary | ICD-10-CM | POA: Diagnosis not present

## 2012-10-21 DIAGNOSIS — D649 Anemia, unspecified: Secondary | ICD-10-CM | POA: Diagnosis not present

## 2012-10-21 DIAGNOSIS — I119 Hypertensive heart disease without heart failure: Secondary | ICD-10-CM | POA: Diagnosis not present

## 2012-10-21 DIAGNOSIS — E785 Hyperlipidemia, unspecified: Secondary | ICD-10-CM | POA: Diagnosis not present

## 2012-10-21 DIAGNOSIS — R5381 Other malaise: Secondary | ICD-10-CM | POA: Diagnosis not present

## 2012-10-21 DIAGNOSIS — C3490 Malignant neoplasm of unspecified part of unspecified bronchus or lung: Secondary | ICD-10-CM | POA: Diagnosis not present

## 2012-10-21 DIAGNOSIS — I10 Essential (primary) hypertension: Secondary | ICD-10-CM | POA: Diagnosis not present

## 2012-10-21 DIAGNOSIS — K5900 Constipation, unspecified: Secondary | ICD-10-CM | POA: Diagnosis not present

## 2012-10-24 DIAGNOSIS — Z23 Encounter for immunization: Secondary | ICD-10-CM | POA: Diagnosis not present

## 2012-11-21 ENCOUNTER — Other Ambulatory Visit (HOSPITAL_COMMUNITY): Payer: Self-pay | Admitting: Oncology

## 2012-11-21 DIAGNOSIS — C3430 Malignant neoplasm of lower lobe, unspecified bronchus or lung: Secondary | ICD-10-CM

## 2012-11-27 DIAGNOSIS — H4010X0 Unspecified open-angle glaucoma, stage unspecified: Secondary | ICD-10-CM | POA: Diagnosis not present

## 2012-11-27 DIAGNOSIS — H3530 Unspecified macular degeneration: Secondary | ICD-10-CM | POA: Diagnosis not present

## 2012-11-27 DIAGNOSIS — H43399 Other vitreous opacities, unspecified eye: Secondary | ICD-10-CM | POA: Diagnosis not present

## 2012-12-17 DIAGNOSIS — J479 Bronchiectasis, uncomplicated: Secondary | ICD-10-CM

## 2012-12-17 DIAGNOSIS — R059 Cough, unspecified: Secondary | ICD-10-CM

## 2012-12-17 HISTORY — DX: Bronchiectasis, uncomplicated: J47.9

## 2012-12-17 HISTORY — DX: Cough, unspecified: R05.9

## 2012-12-23 DIAGNOSIS — I10 Essential (primary) hypertension: Secondary | ICD-10-CM | POA: Diagnosis not present

## 2012-12-23 DIAGNOSIS — R5381 Other malaise: Secondary | ICD-10-CM | POA: Diagnosis not present

## 2012-12-23 DIAGNOSIS — N182 Chronic kidney disease, stage 2 (mild): Secondary | ICD-10-CM | POA: Diagnosis not present

## 2012-12-23 DIAGNOSIS — D508 Other iron deficiency anemias: Secondary | ICD-10-CM | POA: Diagnosis not present

## 2013-01-20 DIAGNOSIS — F4323 Adjustment disorder with mixed anxiety and depressed mood: Secondary | ICD-10-CM | POA: Diagnosis not present

## 2013-01-27 ENCOUNTER — Encounter (HOSPITAL_COMMUNITY): Payer: Medicare Other | Attending: Oncology

## 2013-01-27 ENCOUNTER — Ambulatory Visit (HOSPITAL_COMMUNITY)
Admission: RE | Admit: 2013-01-27 | Discharge: 2013-01-27 | Disposition: A | Discharge status: Home / Self Care | Payer: Medicare Other | Source: Ambulatory Visit | Attending: Oncology | Admitting: Oncology

## 2013-01-27 DIAGNOSIS — Z923 Personal history of irradiation: Secondary | ICD-10-CM | POA: Diagnosis not present

## 2013-01-27 DIAGNOSIS — Z09 Encounter for follow-up examination after completed treatment for conditions other than malignant neoplasm: Secondary | ICD-10-CM | POA: Diagnosis not present

## 2013-01-27 DIAGNOSIS — E7800 Pure hypercholesterolemia, unspecified: Secondary | ICD-10-CM | POA: Insufficient documentation

## 2013-01-27 DIAGNOSIS — C3430 Malignant neoplasm of lower lobe, unspecified bronchus or lung: Secondary | ICD-10-CM | POA: Diagnosis not present

## 2013-01-27 DIAGNOSIS — F341 Dysthymic disorder: Secondary | ICD-10-CM | POA: Diagnosis not present

## 2013-01-27 DIAGNOSIS — Z85118 Personal history of other malignant neoplasm of bronchus and lung: Secondary | ICD-10-CM | POA: Diagnosis not present

## 2013-01-27 DIAGNOSIS — Z853 Personal history of malignant neoplasm of breast: Secondary | ICD-10-CM | POA: Insufficient documentation

## 2013-01-27 DIAGNOSIS — J984 Other disorders of lung: Secondary | ICD-10-CM | POA: Diagnosis not present

## 2013-01-27 LAB — COMPREHENSIVE METABOLIC PANEL
ALT: 17 U/L (ref 0–35)
Alkaline Phosphatase: 81 U/L (ref 39–117)
BUN: 14 mg/dL (ref 6–23)
CO2: 28 mEq/L (ref 19–32)
Calcium: 10.2 mg/dL (ref 8.4–10.5)
GFR calc Af Amer: 50 mL/min — ABNORMAL LOW (ref >90)
GFR calc non Af Amer: 43 mL/min — ABNORMAL LOW (ref >90)
Glucose, Bld: 109 mg/dL — ABNORMAL HIGH (ref 70–99)
Sodium: 137 mEq/L (ref 135–145)
Total Protein: 7.7 g/dL (ref 6.0–8.3)

## 2013-01-27 LAB — CBC WITH DIFFERENTIAL/PLATELET
Eosinophils Absolute: 0.1 10*3/uL (ref 0.0–0.7)
Eosinophils Relative: 2 % (ref 0–5)
HCT: 37.4 % (ref 36.0–46.0)
Hemoglobin: 12.4 g/dL (ref 12.0–15.0)
Lymphocytes Relative: 18 % (ref 12–46)
Lymphs Abs: 1.1 10*3/uL (ref 0.7–4.0)
MCH: 32.5 pg (ref 26.0–34.0)
MCV: 98.2 fL (ref 78.0–100.0)
Monocytes Relative: 9 % (ref 3–12)
RBC: 3.81 MIL/uL — ABNORMAL LOW (ref 3.87–5.11)
WBC: 6 10*3/uL (ref 4.0–10.5)

## 2013-01-27 MED ORDER — IOHEXOL 300 MG/ML  SOLN
80.0000 mL | Freq: Once | INTRAMUSCULAR | Status: AC | PRN
  Administered 2013-01-27: 80 mL via INTRAVENOUS

## 2013-01-27 NOTE — Progress Notes (Signed)
Labs drawn today for cbc/diff,cmp

## 2013-02-03 ENCOUNTER — Encounter (HOSPITAL_COMMUNITY): Payer: Self-pay | Admitting: Oncology

## 2013-02-03 ENCOUNTER — Encounter (HOSPITAL_BASED_OUTPATIENT_CLINIC_OR_DEPARTMENT_OTHER): Payer: Medicare Other | Admitting: Oncology

## 2013-02-03 VITALS — BP 118/62 | HR 67 | Temp 97.6°F | Resp 18 | Wt 155.8 lb

## 2013-02-03 DIAGNOSIS — F329 Major depressive disorder, single episode, unspecified: Secondary | ICD-10-CM | POA: Diagnosis not present

## 2013-02-03 DIAGNOSIS — C3430 Malignant neoplasm of lower lobe, unspecified bronchus or lung: Secondary | ICD-10-CM

## 2013-02-03 DIAGNOSIS — Z853 Personal history of malignant neoplasm of breast: Secondary | ICD-10-CM

## 2013-02-03 DIAGNOSIS — M810 Age-related osteoporosis without current pathological fracture: Secondary | ICD-10-CM | POA: Diagnosis not present

## 2013-02-03 DIAGNOSIS — C50919 Malignant neoplasm of unspecified site of unspecified female breast: Secondary | ICD-10-CM

## 2013-02-03 NOTE — Progress Notes (Signed)
#  1 bronchoalveolar carcinoma left lung, stage I, lower lobe, status post radiation therapy via tomotherapy by Dr. Lisbeth Renshaw in August of 2011. #2 stage I (T1 C., N0, M0) infiltrating ductal carcinoma left breast, 1.3 cm, ER +70%, PR +25%, Ki-67 marker low at 5%, HER-2/neu not amplified, though she did have LV I. 6 nodes were negative. She had a partial mastectomy on 04/03/2000 followed by radiation therapy, followed by 5 years of anastrozole finishing in May 2006 thus far without recurrence. #3 depression, reactive, from the loss of her husband within the last year she is doing better. #4 steatohepatitis #5 bilateral hip replacements still with weakness of the right leg #6 right shoulder generative joint disease with decreased range of motion #7 rheumatic fever at age 24 #8 scoliosis #9 hypercholesterolemia #10 biceps tendon rupture in the past #11 cholecystectomy 1965 #12 osteoporosis on therapy with zoledronic acid, calcium, and vitamin D. She needs a bone density either at the end of this year or early next.  She is doing well from a review of systems standpoint. She is about to move to the [facility] in [facility] [place] [facility]. She will still be able to see her farm house on weekends and when her family visits from [place] though she does have one son living there constantly.  She may need to see either Dr. Lisbeth Renshaw in followup or one of my associates in [place] if it becomes too difficult for her to get here when she moves to [place] [employer].  Her oncology review of systems is negative. BP 118/62  Pulse 67  Temp(Src) 97.6 F (36.4 C) (Oral)  Resp 18  Wt 155 lb 12.8 oz (70.67 kg)  BMI 26.73 kg/m2  She is in no acute distress. She still has limited range of motion of the right shoulder. Lungs are perfectly clear to auscultation and percussion. The left breast is slightly deformed but without masses still slightly tender. The right breast is negative. Lungs show no rales rubs or rhonchi. Heart  shows a regular rhythm and rate without murmur rub or gallop. Abdomen remains soft and nontender without hepatosplenomegaly. Bowel sounds are normal. She has no leg edema other than puffiness at the ankles. She has no arm edema.  She looks great she needs a CAT scans and blood work in 12 months sooner if things change but she will continue to see [place] once a year if she can.

## 2013-02-03 NOTE — Patient Instructions (Addendum)
[facility] Discharge Instructions  RECOMMENDATIONS MADE BY THE CONSULTANT AND ANY TEST RESULTS WILL BE SENT TO YOUR REFERRING PHYSICIAN.  EXAM FINDINGS BY THE PHYSICIAN TODAY AND SIGNS OR SYMPTOMS TO REPORT TO CLINIC OR PRIMARY PHYSICIAN: Exam and discussion by MD.  [name] are doing well. Your blood work and scans were great.  MEDICATIONS PRESCRIBED:  none  INSTRUCTIONS GIVEN AND DISCUSSED: Report any new lumps, bone pain or shortness of breath.  SPECIAL INSTRUCTIONS/FOLLOW-UP: 1 year for blood work , CT of chest and to see MD.  Thank you for choosing [facility] to provide your oncology and hematology care.  To afford each patient quality time with our providers, please arrive at least 15 minutes before your scheduled appointment time.  With your help, our goal is to use those 15 minutes to complete the necessary work-up to ensure our physicians have the information they need to help with your evaluation and healthcare recommendations.    Effective January 1st, 2014, we ask that you re-schedule your appointment with our physicians should you arrive 10 or more minutes late for your appointment.  We strive to give you quality time with our providers, and arriving late affects you and other patients whose appointments are after yours.    Again, thank you for choosing [facility].  Our hope is that these requests will decrease the amount of time that you wait before being seen by our physicians.       _____________________________________________________________  Should you have questions after your visit to [facility], please contact our office at [phone] between the hours of 8:30 a.m. and 5:00 p.m.  Voicemails left after 4:30 p.m. will not be returned until the following business day.  For prescription refill requests, have your pharmacy contact our office with your prescription refill request.

## 2013-03-05 ENCOUNTER — Other Ambulatory Visit (HOSPITAL_COMMUNITY): Payer: Medicare Other

## 2013-03-06 ENCOUNTER — Other Ambulatory Visit (HOSPITAL_COMMUNITY): Payer: Medicare Other

## 2013-03-10 ENCOUNTER — Ambulatory Visit (HOSPITAL_COMMUNITY): Payer: Medicare Other | Admitting: Oncology

## 2013-03-16 DIAGNOSIS — R531 Weakness: Secondary | ICD-10-CM | POA: Diagnosis not present

## 2013-03-16 DIAGNOSIS — M6281 Muscle weakness (generalized): Secondary | ICD-10-CM | POA: Diagnosis not present

## 2013-03-16 DIAGNOSIS — R32 Unspecified urinary incontinence: Secondary | ICD-10-CM | POA: Diagnosis not present

## 2013-03-19 DIAGNOSIS — H26499 Other secondary cataract, unspecified eye: Secondary | ICD-10-CM | POA: Diagnosis not present

## 2013-03-19 DIAGNOSIS — H4011X0 Primary open-angle glaucoma, stage unspecified: Secondary | ICD-10-CM | POA: Diagnosis not present

## 2013-04-16 DIAGNOSIS — H26499 Other secondary cataract, unspecified eye: Secondary | ICD-10-CM | POA: Diagnosis not present

## 2013-04-16 DIAGNOSIS — H4011X0 Primary open-angle glaucoma, stage unspecified: Secondary | ICD-10-CM | POA: Diagnosis not present

## 2013-04-16 DIAGNOSIS — H43819 Vitreous degeneration, unspecified eye: Secondary | ICD-10-CM | POA: Diagnosis not present

## 2013-04-16 DIAGNOSIS — H3530 Unspecified macular degeneration: Secondary | ICD-10-CM | POA: Diagnosis not present

## 2013-05-12 DIAGNOSIS — H4011X0 Primary open-angle glaucoma, stage unspecified: Secondary | ICD-10-CM | POA: Diagnosis not present

## 2013-05-21 ENCOUNTER — Encounter: Payer: Self-pay | Admitting: Nurse Practitioner

## 2013-05-21 ENCOUNTER — Non-Acute Institutional Stay: Payer: Medicare Other | Admitting: Nurse Practitioner

## 2013-05-21 VITALS — BP 120/62 | HR 68 | Temp 98.4°F | Ht 63.5 in | Wt 156.0 lb

## 2013-05-21 DIAGNOSIS — E785 Hyperlipidemia, unspecified: Secondary | ICD-10-CM

## 2013-05-21 DIAGNOSIS — M810 Age-related osteoporosis without current pathological fracture: Secondary | ICD-10-CM

## 2013-05-21 DIAGNOSIS — I471 Supraventricular tachycardia: Secondary | ICD-10-CM

## 2013-05-21 DIAGNOSIS — C50919 Malignant neoplasm of unspecified site of unspecified female breast: Secondary | ICD-10-CM

## 2013-05-21 DIAGNOSIS — K219 Gastro-esophageal reflux disease without esophagitis: Secondary | ICD-10-CM

## 2013-05-21 DIAGNOSIS — D649 Anemia, unspecified: Secondary | ICD-10-CM

## 2013-05-21 DIAGNOSIS — R5381 Other malaise: Secondary | ICD-10-CM

## 2013-05-21 DIAGNOSIS — M1990 Unspecified osteoarthritis, unspecified site: Secondary | ICD-10-CM

## 2013-05-21 DIAGNOSIS — R5383 Other fatigue: Secondary | ICD-10-CM

## 2013-05-22 NOTE — Progress Notes (Signed)
Patient ID: [name], female   DOB: 10-31-1921, 79 y.o.   MRN: [id number]   Allergies  Allergen Reactions  . Ibuprofen Hives  . Naproxen   . Statins Other (See Comments)    Feels like knives in stomach  . Tape Rash    Chief Complaint  Patient presents with  . Medical Managment of Chronic Issues    New Patient, EV, blood pressure, anemia, cholesterol    HPI: Patient is a 79 y.o. female seen in the clinic at [facility] today for new patient establishment.   Problem List Items Addressed This Visit   NEOPLASM, MALIGNANT, BREAST, LEFT     Left breast surgical scar present, mild left arm lymphedema on and off.     HYPERLIPIDEMIA - Primary     Will f/u lipid panel, continue Niacin.     ANEMIA-NOS     Currently taking Fe, B12, Folate, will update CBC    PAROXYSMAL SUPRAVENTRICULAR TACHYCARDIA     The patient stated she has less palpitation since Atenolol 12.68m started.     GERD     Stable on Omeprazole.     OSTEOARTHRITIS     Multiple sites, mostly her right hip--walk for short distance with walker, has been walking more since she moved into [place] @ [id number]    OSTEOPOROSIS     Takes Zoledronic acid 58m62mV yearly    FATIGUE     Seems better since she moved into [place] @ [facility] 2 months ago--check CMP and TSH       Review of Systems:  Review of Systems  Constitutional: Positive for malaise/fatigue. Negative for fever, chills, weight loss and diaphoresis.  HENT: Positive for hearing loss. Negative for ear pain, congestion, sore throat and neck pain.   Eyes: Negative for blurred vision, double vision, photophobia, pain, discharge and redness.  Respiratory: Negative for cough, hemoptysis, sputum production, shortness of breath, wheezing and stridor.   Cardiovascular: Negative for chest pain, palpitations, orthopnea, claudication and leg swelling.  Gastrointestinal: Negative for heartburn, nausea, vomiting, abdominal pain, diarrhea, constipation and blood in stool.   Genitourinary: Negative for dysuria, urgency, frequency, hematuria and flank pain.  Musculoskeletal: Positive for back pain and joint pain. Negative for myalgias and falls.  Skin: Negative for rash.  Neurological: Negative for dizziness, tingling, tremors, sensory change, speech change, focal weakness, seizures, loss of consciousness, weakness and headaches.  Endo/Heme/Allergies: Negative for environmental allergies and polydipsia. Does not bruise/bleed easily.  Psychiatric/Behavioral: Negative for depression, hallucinations and memory loss. The patient is not nervous/anxious and does not have insomnia.      Past Medical History  Diagnosis Date  . Lung cancer 05/2010    Left 2011 - rad x 3  . Rheumatic fever     Age [age] Pneumonia     RLL with sepsis  . Biceps tendon rupture     Bilateral  . Elevated liver enzymes     Biopsy consistent with steatohepatitis  . Right thyroid nodule   . Anxiety   . Lymphedema     Left arm  . Overactive bladder   . PSVT (paroxysmal supraventricular tachycardia)   . Osteoporosis   . Osteoarthritis   . Low back pain     scoliosis  . Hyperlipidemia   . GERD (gastroesophageal reflux disease) 06/2007    EGD Dr Rou[facility]ala [facility], multiple fundic gland polyps  . Type 2 diabetes mellitus   . Anemia     NOS  .  Allergic rhinitis   . Insomnia   . Diverticulosis   . Hemorrhoids   . Hiatal hernia   . Steatohepatitis     liver bx  . Fundic gland polyps of stomach, benign   . Hx of radiation therapy 07/17/10 to 07/21/10    SRS LLL lung  . Breast cancer 2001    Left s/p lumpectomy and XRT   . Depression   . Glaucoma 2013    both eyes   Past Surgical History  Procedure Laterality Date  . Cataract extraction Bilateral 2003    Lens implant Dr. Charise Killian  . Cholecystectomy  1965  . Total hip arthroplasty Bilateral 2005 & 2007     Dr. Earl Lagos. Aline Brochure   . Abdominal hysterectomy  1964  . Breast lumpectomy  2001  . Cystoscopy  2007  . Breast biopsy       X 3 w/ cystectomy  . Revision total hip arthroplasty  2007    Left  . Liver biopsy  2008  . Colonoscopy  09/11/2011    Procedure: COLONOSCOPY;  Surgeon: Dorothyann Peng, MD;  [facility];  Service: Endoscopy;  Laterality: N/A;  . Esophagogastroduodenoscopy  09/11/2011    Procedure: ESOPHAGOGASTRODUODENOSCOPY (EGD);  Surgeon: Dorothyann Peng, MD;  Location: [facility];  Service: Endoscopy;  Laterality: N/A;  . Colonoscopy  2005 /2012    Dr. Lucianne Muss- diverticulosis, ext hemorrhoids.  . Tonsillectomy  1930  . Appendectomy  1964   Social History:   reports that she has never smoked. She has never used smokeless tobacco. She reports that she does not drink alcohol or use illicit drugs.  Family History  Problem Relation Age of Onset  . Heart failure Mother   . Osteoporosis Mother   . Hypertension Father   . Stroke Father   . Heart failure Father   . Leukemia Brother   . Heart failure Brother   . Cancer      Medications: Patient's Medications  New Prescriptions   No medications on file  Previous Medications   ASPIRIN 81 MG EC TABLET    Take 81 mg by mouth daily.     ATENOLOL (TENORMIN) 25 MG TABLET    TAKE (1/2) TABLET BY MOUTH DAILY.   BIOTIN 1000 MCG TABLET    Take 1,000 mcg by mouth daily.   BRIMONIDINE (ALPHAGAN) 0.2 % OPHTHALMIC SOLUTION    1 drop. One drop left eye twice daily   CALCIUM CARBONATE-VITAMIN D (CALTRATE 600+D) 600-400 MG-UNIT PER TABLET    Take 1 tablet by mouth daily.     DEXTRAN 70-HYPROMELLOSE (TEARS RENEWED) OPHTHALMIC SOLUTION    1 drop. One drop both eyes four times daily   FERROUS SULFATE DRIED (SLOW FE) 160 (50 FE) MG TBCR    Take 160 mg by mouth daily.     FIBER TABS    Take 4 tablets by mouth daily.   FOLIC ACID (FOLVITE) 889 MCG TABLET    Take 400 mcg by mouth daily.     LATANOPROST (XALATAN) 0.005 % OPHTHALMIC SOLUTION    1 drop. One drop in each eye once a day   MULTIPLE VITAMIN (MULTIVITAMIN) CAPSULE    Take 1 capsule by mouth daily.      NIACIN (SLO-NIACIN) 500 MG TABLET    Take 500 mg by mouth daily.     OMEPRAZOLE (PRILOSEC) 20 MG CAPSULE    Take 20 mg by mouth daily.     VITAMIN B-12 (CYANOCOBALAMIN) 1000 MCG TABLET  Take 1,000 mcg by mouth daily.   ZOLEDRONIC ACID (ZOMETA IV)    Inject into the vein. Twice yearly  Modified Medications   No medications on file  Discontinued Medications   DOCUSATE CALCIUM (STOOL SOFTENER PO)    Take by mouth as needed.   TRAVOPROST (TRAVATAN Z OP)    Apply to eye.     Physical Exam: Physical Exam  Constitutional: She is oriented to person, place, and time. She appears well-developed and well-nourished. No distress.  HENT:  Head: Normocephalic and atraumatic.  Right Ear: Tympanic membrane mobility is abnormal. Decreased hearing is noted.  Left Ear: A middle ear effusion is present. Decreased hearing is noted.  Mouth/Throat: No oropharyngeal exudate.  torturous palatinus.   Eyes: Conjunctivae and EOM are normal. Pupils are equal, round, and reactive to light. Right eye exhibits no discharge. Left eye exhibits no discharge. No scleral icterus.  Neck: Normal range of motion. Neck supple. No JVD present. No thyromegaly present.  Cardiovascular: Normal rate and regular rhythm.   Murmur heard.  Systolic murmur is present with a grade of 2/6  Pulmonary/Chest: Effort normal and breath sounds normal. No respiratory distress. She has no wheezes. She has no rales. She exhibits no tenderness.  Abdominal: Soft. Bowel sounds are normal. She exhibits no distension. There is no tenderness. There is no rebound.  Genitourinary: Vagina normal. Guaiac negative stool. No vaginal discharge found.  Musculoskeletal: Normal range of motion. She exhibits tenderness (right hip is the most. ). She exhibits no edema.  Lymphadenopathy:    She has no cervical adenopathy.  Neurological: She is alert and oriented to person, place, and time. She has normal reflexes. She displays normal reflexes. No cranial nerve  deficit. She exhibits abnormal muscle tone. Coordination normal.  Skin: Skin is warm and dry. No rash noted. She is not diaphoretic. No erythema. No pallor.  Psychiatric: She has a normal mood and affect. Her behavior is normal. Judgment and thought content normal.    Filed Vitals:   05/21/13 1659  BP: 120/62  Pulse: 68  Temp: 98.4 F (36.9 C)  TempSrc: Oral  Height: 5' 3.5" (1.613 m)  Weight: 156 lb (70.761 kg)      Labs reviewed: Basic Metabolic Panel:  Recent Labs  01/27/13 0927  NA 137  K 3.9  CL 97  CO2 28  GLUCOSE 109*  BUN 14  CREATININE 1.10  CALCIUM 10.2   Liver Function Tests:  Recent Labs  01/27/13 0927  AST 25  ALT 17  ALKPHOS 81  BILITOT 0.4  PROT 7.7  ALBUMIN 3.5   No results found for this basename: LIPASE, AMYLASE,  in the last 8760 hours No results found for this basename: AMMONIA,  in the last 8760 hours CBC:  Recent Labs  01/27/13 0927  WBC 6.0  NEUTROABS 4.2  HGB 12.4  HCT 37.4  MCV 98.2  PLT 224   Lipid Panel: No results found for this basename: CHOL, HDL, LDLCALC, TRIG, CHOLHDL, LDLDIRECT,  in the last 8760 hours Anemia Panel: No results found for this basename: FOLATE, IRON, VITAMINB12,  in the last 8760 hours  Past Procedures: 01/27/13 CT chest IMPRESSION:  Chronic parenchymal lung changes likely related to prior radiation  therapy and scarring.  No evidence of recurrent or metastatic disease.    Assessment/Plan HYPERLIPIDEMIA Will f/u lipid panel, continue Niacin.   ANEMIA-NOS Currently taking Fe, B12, Folate, will update CBC  PAROXYSMAL SUPRAVENTRICULAR TACHYCARDIA The patient stated she has less palpitation since  Atenolol 12.54m started.   GERD Stable on Omeprazole.   OSTEOPOROSIS Takes Zoledronic acid 575mIV yearly  OSTEOARTHRITIS Multiple sites, mostly her right hip--walk for short distance with walker, has been walking more since she moved into [place] @ [id number]  NEOPLASM, MALIGNANT, BREAST,  LEFT Left breast surgical scar present, mild left arm lymphedema on and off.   FATIGUE Seems better since she moved into [place] @ [facility] 2 months ago--check CMP and TSH    Family/ Staff Communication:  Observe the patient.   Goals of Care: [place]  Labs/tests ordered: CBC, CMP, lipid panel, TSH, EKG

## 2013-05-22 NOTE — Assessment & Plan Note (Addendum)
Multiple sites, mostly her right hip--walk for short distance with walker, has been walking more since she moved into [place] @ [facility] 47month ago

## 2013-05-22 NOTE — Assessment & Plan Note (Signed)
Currently taking Fe, B12, Folate, will update CBC

## 2013-05-22 NOTE — Assessment & Plan Note (Signed)
Seems better since she moved into [place] @ [facility] 2 months ago--check CMP and TSH

## 2013-05-22 NOTE — Assessment & Plan Note (Signed)
The patient stated she has less palpitation since Atenolol 12.27m started.

## 2013-05-22 NOTE — Assessment & Plan Note (Signed)
Takes Zoledronic acid 55m IV yearly

## 2013-05-22 NOTE — Assessment & Plan Note (Signed)
Will f/u lipid panel, continue Niacin.

## 2013-05-22 NOTE — Assessment & Plan Note (Signed)
Stable on Omeprazole.

## 2013-05-22 NOTE — Assessment & Plan Note (Signed)
Left breast surgical scar present, mild left arm lymphedema on and off.

## 2013-05-26 DIAGNOSIS — H26499 Other secondary cataract, unspecified eye: Secondary | ICD-10-CM | POA: Diagnosis not present

## 2013-05-26 DIAGNOSIS — H3530 Unspecified macular degeneration: Secondary | ICD-10-CM | POA: Diagnosis not present

## 2013-05-26 DIAGNOSIS — H43819 Vitreous degeneration, unspecified eye: Secondary | ICD-10-CM | POA: Diagnosis not present

## 2013-05-26 DIAGNOSIS — H4011X0 Primary open-angle glaucoma, stage unspecified: Secondary | ICD-10-CM | POA: Diagnosis not present

## 2013-05-29 ENCOUNTER — Ambulatory Visit (INDEPENDENT_AMBULATORY_CARE_PROVIDER_SITE_OTHER): Payer: Medicare Other | Admitting: Family Medicine

## 2013-05-29 ENCOUNTER — Ambulatory Visit: Payer: Medicare Other

## 2013-05-29 VITALS — BP 149/64 | HR 86 | Temp 98.8°F | Resp 17 | Ht 63.5 in | Wt 156.0 lb

## 2013-05-29 DIAGNOSIS — R059 Cough, unspecified: Secondary | ICD-10-CM

## 2013-05-29 DIAGNOSIS — Z85118 Personal history of other malignant neoplasm of bronchus and lung: Secondary | ICD-10-CM

## 2013-05-29 DIAGNOSIS — J189 Pneumonia, unspecified organism: Secondary | ICD-10-CM | POA: Diagnosis not present

## 2013-05-29 DIAGNOSIS — R05 Cough: Secondary | ICD-10-CM | POA: Diagnosis not present

## 2013-05-29 LAB — POCT CBC
Granulocyte percent: 65.1 %G (ref 37–80)
HCT, POC: 39.9 % (ref 37.7–47.9)
MCH, POC: 31.8 pg — AB (ref 27–31.2)
MCV: 100.8 fL — AB (ref 80–97)
MID (cbc): 0.6 (ref 0–0.9)
POC LYMPH PERCENT: 23.3 %L (ref 10–50)
Platelet Count, POC: 201 10*3/uL (ref 142–424)
RBC: 3.96 M/uL — AB (ref 4.04–5.48)
WBC: 5.4 10*3/uL (ref 4.6–10.2)

## 2013-05-29 MED ORDER — AZITHROMYCIN 250 MG PO TABS: ORAL_TABLET | ORAL | Status: DC

## 2013-05-29 NOTE — Patient Instructions (Signed)
Start the antibiotic for a possible early left lower pneumonia. mucinex if needed for cough. Return to the clinic or go to the nearest emergency room if any of your symptoms worsen or new symptoms occur. Cough, Adult  A cough is a reflex that helps clear your throat and airways. It can help heal the body or may be a reaction to an irritated airway. A cough may only last 2 or 3 weeks (acute) or may last more than 8 weeks (chronic).  CAUSES Acute cough:  Viral or bacterial infections. Chronic cough:  Infections.  Allergies.  Asthma.  Post-nasal drip.  Smoking.  Heartburn or acid reflux.  Some medicines.  Chronic lung problems (COPD).  Cancer. SYMPTOMS   Cough.  Fever.  Chest pain.  Increased breathing rate.  High-pitched whistling sound when breathing (wheezing).  Colored mucus that you cough up (sputum). TREATMENT   A bacterial cough may be treated with antibiotic medicine.  A viral cough must run its course and will not respond to antibiotics.  Your caregiver may recommend other treatments if you have a chronic cough. HOME CARE INSTRUCTIONS   Only take over-the-counter or prescription medicines for pain, discomfort, or fever as directed by your caregiver. Use cough suppressants only as directed by your caregiver.  Use a cold steam vaporizer or humidifier in your bedroom or home to help loosen secretions.  Sleep in a semi-upright position if your cough is worse at night.  Rest as needed.  Stop smoking if you smoke. SEEK IMMEDIATE MEDICAL CARE IF:   You have pus in your sputum.  Your cough starts to worsen.  You cannot control your cough with suppressants and are losing sleep.  You begin coughing up blood.  You have difficulty breathing.  You develop pain which is getting worse or is uncontrolled with medicine.  You have a fever. MAKE SURE YOU:   Understand these instructions.  Will watch your condition.  Will get help right away if you are  not doing well or get worse. Document Released: 06/01/2011 Document Revised: 02/25/2012 Document Reviewed: 06/01/2011 [facility] Patient Information 2014 [place].

## 2013-05-29 NOTE — Progress Notes (Signed)
Subjective:    Patient ID: [name], female    DOB: [birth year], 79 y.o.   MRN: [id number]  HPI [name] is a 79 y.o. female PCP: Dr. Nelva Nay, lives at [facility] [place]. appt in August with PCP. Hx of multiple medical problems as below - presents with: Cough - started last weekend - about a week ago - initially tickle into back of throat.  Dry cough.  Had been improving few days ago, less yesterday.  Noticed more cough this morning with feeling of wheeze this morning - only during couging spell. Nurse at [facility] noticed congestion in L lung. No fever. No chest pain/heavines or pressure. Slight DOE with long walking only.   Pneumovax in 2007.   Tx: none.    Hx of L lung cancer treated with radiation in 2011 - cleared except some scar tissue? Last CT scan Feb 2014 - ok.   Past Medical History  Diagnosis Date  . Lung cancer 05/2010    Left 2011 - rad x 3  . Rheumatic fever     Age 78  . Pneumonia     RLL with sepsis  . Biceps tendon rupture     Bilateral  . Elevated liver enzymes     Biopsy consistent with steatohepatitis  . Right thyroid nodule   . Anxiety   . Lymphedema     Left arm  . Overactive bladder   . PSVT (paroxysmal supraventricular tachycardia)   . Osteoporosis   . Osteoarthritis   . Low back pain     scoliosis  . Hyperlipidemia   . GERD (gastroesophageal reflux disease) 06/2007    EGD Dr Gala Romney >[facility], multiple fundic gland polyps  . Type 2 diabetes mellitus   . Anemia     NOS  . Allergic rhinitis   . Insomnia   . Diverticulosis   . Hemorrhoids   . Hiatal hernia   . Steatohepatitis     liver bx  . Fundic gland polyps of stomach, benign   . Hx of radiation therapy 07/17/10 to 07/21/10    SRS LLL lung  . Breast cancer 2001    Left s/p lumpectomy and XRT   . Depression   . Glaucoma 2013    both eyes   Past Surgical History  Procedure Laterality Date  . Cataract extraction Bilateral 2003    Lens implant Dr. Charise Killian  .  Cholecystectomy  1965  . Total hip arthroplasty Bilateral 2005 & 2007     Dr. Earl Lagos. Aline Brochure   . Abdominal hysterectomy  1964  . Breast lumpectomy  2001  . Cystoscopy  2007  . Breast biopsy      X 3 w/ cystectomy  . Revision total hip arthroplasty  2007    Left  . Liver biopsy  2008  . Colonoscopy  09/11/2011    Procedure: COLONOSCOPY;  Surgeon: Dorothyann Peng, MD;  [facility];  Service: Endoscopy;  Laterality: N/A;  . Esophagogastroduodenoscopy  09/11/2011    Procedure: ESOPHAGOGASTRODUODENOSCOPY (EGD);  Surgeon: Dorothyann Peng, MD;  [facility];  Service: Endoscopy;  Laterality: N/A;  . Colonoscopy  2005 /2012    Dr. Lucianne Muss- diverticulosis, ext hemorrhoids.  . Tonsillectomy  1930  . Appendectomy  1964   Allergies  Allergen Reactions  . Ibuprofen Hives  . Naproxen   . Statins Other (See Comments)    Feels like knives in stomach  . Tape Rash  .  Prior to Admission medications   Medication Sig Start Date End Date Taking? Authorizing Provider  aspirin 81 MG EC tablet Take 81 mg by mouth daily.     Yes Historical Provider, MD  atenolol (TENORMIN) 25 MG tablet TAKE (1/2) TABLET BY MOUTH DAILY. 07/23/12  Yes Satira Sark, MD  Biotin 1000 MCG tablet Take 1,000 mcg by mouth daily.   Yes Historical Provider, MD  brimonidine (ALPHAGAN) 0.2 % ophthalmic solution 1 drop. One drop left eye twice daily   Yes Historical Provider, MD  Calcium Carbonate-Vitamin D (CALTRATE 600+D) 600-400 MG-UNIT per tablet Take 1 tablet by mouth daily.     Yes Historical Provider, MD  dextran 70-hypromellose (TEARS RENEWED) ophthalmic solution 1 drop. One drop both eyes four times daily   Yes Historical Provider, MD  ferrous sulfate dried (SLOW FE) 160 (50 FE) MG TBCR Take 160 mg by mouth daily.     Yes Historical Provider, MD  Fiber TABS Take 4 tablets by mouth daily.   Yes Historical Provider, MD  folic acid (FOLVITE) 563 MCG tablet Take 400 mcg by mouth daily.     Yes  Historical Provider, MD  latanoprost (XALATAN) 0.005 % ophthalmic solution 1 drop. One drop in each eye once a day   Yes Historical Provider, MD  Multiple Vitamin (MULTIVITAMIN) capsule Take 1 capsule by mouth daily.     Yes Historical Provider, MD  niacin (SLO-NIACIN) 500 MG tablet Take 500 mg by mouth daily.     Yes Historical Provider, MD  omeprazole (PRILOSEC) 20 MG capsule Take 20 mg by mouth daily.     Yes Historical Provider, MD  vitamin B-12 (CYANOCOBALAMIN) 1000 MCG tablet Take 1,000 mcg by mouth daily.   Yes Historical Provider, MD  Zoledronic Acid (ZOMETA IV) Inject into the vein. Twice yearly   Yes Historical Provider, MD   History   Social History  . Marital Status: Widowed    Spouse Name: N/A    Number of Children: 2  . Years of Education: college    Occupational History  . retired Tax Therapist, nutritional    Social History Main Topics  . Smoking status: Never Smoker   . Smokeless tobacco: Never Used     Comment: 2nd hand-husband  . Alcohol Use: No  . Drug Use: No  . Sexually Active: Not Currently   Other Topics Concern  . Not on file   Social History Narrative   2 adopted children      Review of Systems  Constitutional: Negative for fever, chills and activity change.  Respiratory: Positive for cough and wheezing (during cough only.). Negative for choking and shortness of breath (none at rest.).   Cardiovascular: Negative for chest pain and palpitations (hx of occasional skipped beat - no new sx's. ).  Skin: Negative for rash.  Neurological: Positive for tremors.       Objective:   Physical Exam  Vitals reviewed. Constitutional: She is oriented to person, place, and time. She appears well-developed and well-nourished. No distress.  HENT:  Head: Normocephalic and atraumatic.  Right Ear: Hearing, tympanic membrane, external ear and ear canal normal.  Left Ear: Hearing, tympanic membrane, external ear and ear canal normal.  Nose: Nose normal.  Mouth/Throat:  Oropharynx is clear and moist. No oropharyngeal exudate.  Eyes: Conjunctivae and EOM are normal. Pupils are equal, round, and reactive to light.  Cardiovascular: Normal rate, regular rhythm, normal heart sounds and intact distal pulses.   No murmur heard. Pulmonary/Chest: Effort normal.  No respiratory distress. She has no decreased breath sounds. She has no wheezes. She has rhonchi (few coarse bs - RLL. no wheeze.). She has no rales. She exhibits no tenderness.  Musculoskeletal: She exhibits no edema.  Neurological: She is alert and oriented to person, place, and time.  Skin: Skin is warm and dry. No rash noted.  Psychiatric: She has a normal mood and affect. Her behavior is normal.     Results for orders placed in visit on 05/29/13  POCT CBC      Result Value Range   WBC 5.4  4.6 - 10.2 K/uL   Lymph, poc 1.3  0.6 - 3.4   POC LYMPH PERCENT 23.3  10 - 50 %L   MID (cbc) 0.6  0 - 0.9   POC MID % 11.6  0 - 12 %M   POC Granulocyte 3.5  2 - 6.9   Granulocyte percent 65.1  37 - 80 %G   RBC 3.96 (*) 4.04 - 5.48 M/uL   Hemoglobin 12.6  12.2 - 16.2 g/dL   HCT, POC 39.9  37.7 - 47.9 %   MCV 100.8 (*) 80 - 97 fL   MCH, POC 31.8 (*) 27 - 31.2 pg   MCHC 31.6 (*) 31.8 - 35.4 g/dL   RDW, POC 13.0     Platelet Count, POC 201  142 - 424 K/uL   MPV 10  0 - 99.8 fL   [facility] reading (PRIMARY) by  Dr. Carlota Raspberry: scarring LLL, but ? Slight increased markings compared to 2012 CXR.     Assessment & Plan:  RILLA BUCKMAN is a 79 y.o. female Cough - Plan: POCT CBC, DG Chest 2 View, azithromycin (ZITHROMAX) 250 MG tablet  LLL pneumonia - Plan: azithromycin (ZITHROMAX) 250 MG tablet  Hx of cancer of lung - Plan: azithromycin (ZITHROMAX) 250 MG tablet  Possible early LLL pna, with prior hx of lung cancer, and likely scar. reassurring CBC, O2 sat ok.  Will start Zpak, mucinex prn, rtc precautions.   Meds ordered this encounter  Medications  . azithromycin (ZITHROMAX) 250 MG tablet    Sig: Take 2 pills by  mouth on day 1, then 1 pill by mouth per day on days 2 through 5.    Dispense:  6 each    Refill:  0   Patient Instructions  Start the antibiotic for a possible early left lower pneumonia. mucinex if needed for cough. Return to the clinic or go to the nearest emergency room if any of your symptoms worsen or new symptoms occur. Cough, Adult  A cough is a reflex that helps clear your throat and airways. It can help heal the body or may be a reaction to an irritated airway. A cough may only last 2 or 3 weeks (acute) or may last more than 8 weeks (chronic).  CAUSES Acute cough:  Viral or bacterial infections. Chronic cough:  Infections.  Allergies.  Asthma.  Post-nasal drip.  Smoking.  Heartburn or acid reflux.  Some medicines.  Chronic lung problems (COPD).  Cancer. SYMPTOMS   Cough.  Fever.  Chest pain.  Increased breathing rate.  High-pitched whistling sound when breathing (wheezing).  Colored mucus that you cough up (sputum). TREATMENT   A bacterial cough may be treated with antibiotic medicine.  A viral cough must run its course and will not respond to antibiotics.  Your caregiver may recommend other treatments if you have a chronic cough. HOME CARE INSTRUCTIONS   Only take over-the-counter  or prescription medicines for pain, discomfort, or fever as directed by your caregiver. Use cough suppressants only as directed by your caregiver.  Use a cold steam vaporizer or humidifier in your bedroom or home to help loosen secretions.  Sleep in a semi-upright position if your cough is worse at night.  Rest as needed.  Stop smoking if you smoke. SEEK IMMEDIATE MEDICAL CARE IF:   You have pus in your sputum.  Your cough starts to worsen.  You cannot control your cough with suppressants and are losing sleep.  You begin coughing up blood.  You have difficulty breathing.  You develop pain which is getting worse or is uncontrolled with medicine.  You have  a fever. MAKE SURE YOU:   Understand these instructions.  Will watch your condition.  Will get help right away if you are not doing well or get worse. Document Released: 06/01/2011 Document Revised: 02/25/2012 Document Reviewed: 06/01/2011 [facility] Patient Information 2014 [place].

## 2013-06-02 ENCOUNTER — Ambulatory Visit (INDEPENDENT_AMBULATORY_CARE_PROVIDER_SITE_OTHER): Payer: Medicare Other | Admitting: Family Medicine

## 2013-06-02 ENCOUNTER — Telehealth: Payer: Self-pay

## 2013-06-02 ENCOUNTER — Emergency Department (HOSPITAL_COMMUNITY)
Admission: EM | Admit: 2013-06-02 | Discharge: 2013-06-02 | Disposition: A | Discharge status: Home / Self Care | Payer: Medicare Other | Attending: Emergency Medicine | Admitting: Emergency Medicine

## 2013-06-02 ENCOUNTER — Encounter (HOSPITAL_COMMUNITY): Payer: Self-pay | Admitting: Emergency Medicine

## 2013-06-02 ENCOUNTER — Ambulatory Visit: Payer: Medicare Other

## 2013-06-02 ENCOUNTER — Emergency Department (HOSPITAL_COMMUNITY): Payer: Medicare Other

## 2013-06-02 VITALS — BP 160/70 | HR 73 | Temp 99.3°F | Resp 17 | Ht 64.0 in | Wt 152.0 lb

## 2013-06-02 DIAGNOSIS — R062 Wheezing: Secondary | ICD-10-CM | POA: Diagnosis not present

## 2013-06-02 DIAGNOSIS — Z87828 Personal history of other (healed) physical injury and trauma: Secondary | ICD-10-CM | POA: Insufficient documentation

## 2013-06-02 DIAGNOSIS — Z862 Personal history of diseases of the blood and blood-forming organs and certain disorders involving the immune mechanism: Secondary | ICD-10-CM | POA: Diagnosis not present

## 2013-06-02 DIAGNOSIS — Z8659 Personal history of other mental and behavioral disorders: Secondary | ICD-10-CM | POA: Insufficient documentation

## 2013-06-02 DIAGNOSIS — R05 Cough: Secondary | ICD-10-CM

## 2013-06-02 DIAGNOSIS — E785 Hyperlipidemia, unspecified: Secondary | ICD-10-CM | POA: Diagnosis not present

## 2013-06-02 DIAGNOSIS — Z87448 Personal history of other diseases of urinary system: Secondary | ICD-10-CM | POA: Diagnosis not present

## 2013-06-02 DIAGNOSIS — Z923 Personal history of irradiation: Secondary | ICD-10-CM | POA: Diagnosis not present

## 2013-06-02 DIAGNOSIS — D649 Anemia, unspecified: Secondary | ICD-10-CM | POA: Insufficient documentation

## 2013-06-02 DIAGNOSIS — Z853 Personal history of malignant neoplasm of breast: Secondary | ICD-10-CM | POA: Diagnosis not present

## 2013-06-02 DIAGNOSIS — E119 Type 2 diabetes mellitus without complications: Secondary | ICD-10-CM | POA: Diagnosis not present

## 2013-06-02 DIAGNOSIS — Z8669 Personal history of other diseases of the nervous system and sense organs: Secondary | ICD-10-CM | POA: Insufficient documentation

## 2013-06-02 DIAGNOSIS — M810 Age-related osteoporosis without current pathological fracture: Secondary | ICD-10-CM | POA: Diagnosis not present

## 2013-06-02 DIAGNOSIS — K219 Gastro-esophageal reflux disease without esophagitis: Secondary | ICD-10-CM | POA: Diagnosis not present

## 2013-06-02 DIAGNOSIS — R059 Cough, unspecified: Secondary | ICD-10-CM

## 2013-06-02 DIAGNOSIS — Z8679 Personal history of other diseases of the circulatory system: Secondary | ICD-10-CM | POA: Diagnosis not present

## 2013-06-02 DIAGNOSIS — Z8719 Personal history of other diseases of the digestive system: Secondary | ICD-10-CM | POA: Insufficient documentation

## 2013-06-02 DIAGNOSIS — I471 Supraventricular tachycardia, unspecified: Secondary | ICD-10-CM | POA: Insufficient documentation

## 2013-06-02 DIAGNOSIS — Z8701 Personal history of pneumonia (recurrent): Secondary | ICD-10-CM | POA: Insufficient documentation

## 2013-06-02 DIAGNOSIS — Z85118 Personal history of other malignant neoplasm of bronchus and lung: Secondary | ICD-10-CM | POA: Insufficient documentation

## 2013-06-02 DIAGNOSIS — M1990 Unspecified osteoarthritis, unspecified site: Secondary | ICD-10-CM | POA: Diagnosis not present

## 2013-06-02 DIAGNOSIS — R5381 Other malaise: Secondary | ICD-10-CM | POA: Diagnosis not present

## 2013-06-02 DIAGNOSIS — Z7982 Long term (current) use of aspirin: Secondary | ICD-10-CM | POA: Diagnosis not present

## 2013-06-02 DIAGNOSIS — F411 Generalized anxiety disorder: Secondary | ICD-10-CM | POA: Insufficient documentation

## 2013-06-02 DIAGNOSIS — Z8709 Personal history of other diseases of the respiratory system: Secondary | ICD-10-CM | POA: Insufficient documentation

## 2013-06-02 DIAGNOSIS — J3489 Other specified disorders of nose and nasal sinuses: Secondary | ICD-10-CM | POA: Insufficient documentation

## 2013-06-02 DIAGNOSIS — R5383 Other fatigue: Secondary | ICD-10-CM | POA: Diagnosis not present

## 2013-06-02 DIAGNOSIS — Z79899 Other long term (current) drug therapy: Secondary | ICD-10-CM | POA: Insufficient documentation

## 2013-06-02 DIAGNOSIS — R0982 Postnasal drip: Secondary | ICD-10-CM | POA: Diagnosis not present

## 2013-06-02 DIAGNOSIS — Z8639 Personal history of other endocrine, nutritional and metabolic disease: Secondary | ICD-10-CM | POA: Insufficient documentation

## 2013-06-02 LAB — POCT CBC
Granulocyte percent: 76 %G (ref 37–80)
Lymph, poc: 1.4 (ref 0.6–3.4)
MCHC: 30.8 g/dL — AB (ref 31.8–35.4)
MID (cbc): 0.5 (ref 0–0.9)
MPV: 9.2 fL (ref 0–99.8)
POC Granulocyte: 5.9 (ref 2–6.9)
POC LYMPH PERCENT: 18.1 %L (ref 10–50)
POC MID %: 5.9 %M (ref 0–12)
Platelet Count, POC: 189 10*3/uL (ref 142–424)
RDW, POC: 12.7 %

## 2013-06-02 MED ORDER — HYDROCOD POLST-CHLORPHEN POLST 10-8 MG/5ML PO LQCR
5.0000 mL | Freq: Once | ORAL | Status: AC
  Administered 2013-06-02: 5 mL via ORAL
  Filled 2013-06-02: qty 5

## 2013-06-02 MED ORDER — HYDROCOD POLST-CHLORPHEN POLST 10-8 MG/5ML PO LQCR: 5.0000 mL | Freq: Two times a day (BID) | ORAL | Status: DC

## 2013-06-02 NOTE — ED Notes (Signed)
Dr Carlota Raspberry from [facility] called stating he had treated patient twice in past four days for cough that has worsened; states has had two chest xrays that have been unremarkable although he placed her on a z-pack the first check; pt back in office tonight but Dr Carlota Raspberry was hesitant giving the patient a breathing treatment with her history of SVT; pt denies chest pain per Dr Carlota Raspberry; requesting for patient to be evaluated for worsening cough/possible failure.

## 2013-06-02 NOTE — ED Provider Notes (Signed)
Medical screening examination/treatment/procedure(s) were performed by non-physician practitioner and as supervising physician I was immediately available for consultation/collaboration.  Leota Jacobsen, MD 06/02/13 [phone]

## 2013-06-02 NOTE — Progress Notes (Signed)
Subjective:    Patient ID: [name], female    DOB: 18-Nov-1921, 79 y.o.   MRN: [id number]  HPI [name] is a 79 y.o. female PCP: [name], but has seen NP at [facility]  [facility]   See last ov 4 days ago. Approximately 1 week hx of cough at that time. Hx of lung cancer, but suspicious XR for LLL PNA.  Started on Z pak.  CBC ok at that time, CXR report did not indicate pneumonia:  CHEST - 2 VIEW  Comparison: 09/10/2011  Findings: Cardiomediastinal silhouette is stable. Hyperinflation  again noted. Stable scarring in the right lower lobe. No acute  infiltrate or pleural effusion. Bony thorax is stable.  IMPRESSION:  No active disease. Hyperinflation again noted. Stable scarring in  the right lower lobe.  Results for orders placed in visit on 05/29/13  POCT CBC      Result Value Range   WBC 5.4  4.6 - 10.2 K/uL   Lymph, poc 1.3  0.6 - 3.4   POC LYMPH PERCENT 23.3  10 - 50 %L   MID (cbc) 0.6  0 - 0.9   POC MID % 11.6  0 - 12 %M   POC Granulocyte 3.5  2 - 6.9   Granulocyte percent 65.1  37 - 80 %G   RBC 3.96 (*) 4.04 - 5.48 M/uL   Hemoglobin 12.6  12.2 - 16.2 g/dL   HCT, POC 39.9  37.7 - 47.9 %   MCV 100.8 (*) 80 - 97 fL   MCH, POC 31.8 (*) 27 - 31.2 pg   MCHC 31.6 (*) 31.8 - 35.4 g/dL   RDW, POC 13.0     Platelet Count, POC 201  142 - 424 K/uL   MPV 10  0 - 99.8 fL   Here for follow up.  Cough has not improved - more cough - during the day as well. Dry cough for mostpart - a little phlegm, but unable to cough it up.  Ribs sore form coughing, but no chest pains.  Sore areas on side of throat can cause cough as well. No relief with Delsym. Cough kept her up last night.  Unable to sleep last night. Denies shortness of breath. No heartburn (but takes prilosc each day). Few night sweats with current illness, worse this am and in office. Feels week, tired. Coughs more if lying back with PND. More nasal congestion today. ? Slight wheezing with cough  past few days.   Hx of PSVT, but less on atenolol.  Denies recent change in palpitations, except here in office. No known hx of CHF or CAD.  No recent prolonged car travel or air travel, no recent calf pain or swelling. Lung cancer in 2011- treated with radiation.    Weight 156 last ov, O2 sat 96% at 05/29/13 ov. (wt 152, O2 sat 93% tonight) - recheck o2sat 96%.   Review of Systems  Constitutional: Positive for appetite change (drinking fluids ok, less solid food. less appetite. ).  HENT: Positive for postnasal drip.   Respiratory: Positive for cough and wheezing. Negative for shortness of breath.   Cardiovascular: Positive for palpitations (few in office. ). Negative for chest pain.  Gastrointestinal: Negative for abdominal pain.  Musculoskeletal: Positive for arthralgias (ribs with cough. ). Negative for joint swelling.  Skin: Negative for rash.       Objective:   Physical Exam  Vitals reviewed. Constitutional: She is oriented to person,  place, and time. She appears well-developed and well-nourished. No distress.  HENT:  Head: Normocephalic and atraumatic.  Right Ear: Hearing, tympanic membrane, external ear and ear canal normal.  Left Ear: Hearing, tympanic membrane, external ear and ear canal normal.  Nose: Nose normal.  Mouth/Throat: Uvula is midline, oropharynx is clear and moist and mucous membranes are normal. No oropharyngeal exudate, posterior oropharyngeal edema or posterior oropharyngeal erythema.  Eyes: Conjunctivae and EOM are normal. Pupils are equal, round, and reactive to light. Right eye exhibits no discharge. Left eye exhibits no discharge.  Cardiovascular: Normal rate, regular rhythm, normal heart sounds and intact distal pulses.   No murmur heard. Pulmonary/Chest: Effort normal. No respiratory distress. She has no decreased breath sounds. She has no wheezes. She has rales in the right lower field and the left lower field.  Neurological: She is alert and oriented  to person, place, and time.  Skin: Skin is warm and dry. No rash noted.  Psychiatric: She has a normal mood and affect. Her behavior is normal.   Results for orders placed in visit on 06/02/13  POCT CBC      Result Value Range   WBC 7.7  4.6 - 10.2 K/uL   Lymph, poc 1.4  0.6 - 3.4   POC LYMPH PERCENT 18.1  10 - 50 %L   MID (cbc) 0.5  0 - 0.9   POC MID % 5.9  0 - 12 %M   POC Granulocyte 5.9  2 - 6.9   Granulocyte percent 76.0  37 - 80 %G   RBC 3.65 (*) 4.04 - 5.48 M/uL   Hemoglobin 11.2 (*) 12.2 - 16.2 g/dL   HCT, POC 36.4 (*) 37.7 - 47.9 %   MCV 99.6 (*) 80 - 97 fL   MCH, POC 30.7  27 - 31.2 pg   MCHC 30.8 (*) 31.8 - 35.4 g/dL   RDW, POC 12.7     Platelet Count, POC 189  142 - 424 K/uL   MPV 9.2  0 - 99.8 fL    [facility] reading (PRIMARY) by  Dr. Carlota [name]: hyperinflation, but appears to have increase in patchy markings - LLL, RLL.   EKG: SR, with occasional PAC. No acute findings.  CXR report: Findings: Normal cardiac silhouette. There is bibasilar  bronchiectasis unchanged from comparison chest radiograph or CT.  This is most dense in the left lower lobe medially. Lungs are  hyperinflated. No clear evidence of infiltrate. No pleural fluid.  No pneumothorax.  IMPRESSION:  Chronic bibasilar bronchiectasis and scarring. No clear  infiltrate.  Clinically significant discrepancy from primary report, if  provided: None       Assessment & Plan:  [name] is a 79 y.o. female Cough - Plan: POCT CBC, DG Chest 2 View, EKG 12-Lead  Other malaise and fatigue - Plan: EKG 12-Lead  History of PSVT (paroxysmal supraventricular tachycardia) - Plan: EKG 12-Lead  Hx of cancer of lung  Now with approximately 2 week hx of cough, but worsening past few days, with fatigue, unable to sleep last night due to cough.  Distant rales vs faint wheeze at bases on exam, but chronic bibasilar bronchiectasis with scarring on CXR.  Doubt cardiac cause with EKG in office.  Worsening cough on  azithromycin - will have evaluated at [facility] for possible trial of neb with hyperinflation vs further bloodwork. Hx of PSVT - discussed concern of neb in office, and discussed with triage nurse at [facility].  May just need antitussive with  viral syndrome and PND. Discussed with triage nurse at [facility].   Patient Instructions  Go to [facility] emergency room for further evaluation of your cough. I will call and advise then you are on the way. If follow up needed after the emergency room - we can see you here if unable to be seen by [facility] Adult medicine.

## 2013-06-02 NOTE — ED Provider Notes (Signed)
History     CSN: [id number]  Arrival date & time 06/02/13  2106   First MD Initiated Contact with Patient 06/02/13 2130      Chief Complaint  Patient presents with  . Cough    (Consider location/radiation/quality/duration/timing/severity/associated sxs/prior treatment) Patient is a 79 y.o. female presenting with cough. The history is provided by the patient.  Cough Cough characteristics:  Non-productive, paroxysmal, dry and hacking Severity:  Moderate Onset quality:  Gradual Duration:  3 weeks Timing:  Intermittent Progression:  Worsening Chronicity:  New Smoker: no   Context: exposure to allergens   Context: not animal exposure, not fumes, not occupational exposure, not sick contacts, not smoke exposure, not upper respiratory infection, not weather changes and not with activity   Relieved by:  Nothing Worsened by:  Lying down Ineffective treatments:  Fluids Associated symptoms: rhinorrhea and wheezing   Associated symptoms: no chest pain, no diaphoresis, no ear fullness, no ear pain, no fever, no headaches, no myalgias, no rash, no shortness of breath, no sinus congestion, no sore throat and no weight loss   Rhinorrhea:    Quality:  Clear   Severity:  Moderate   Timing:  Intermittent   Progression:  Worsening Wheezing:    Severity:  Mild   Onset quality:  Gradual   Duration:  2 days   Timing:  Sporadic   Chronicity:  New   Past Medical History  Diagnosis Date  . Lung cancer 05/2010    Left 2011 - rad x 3  . Rheumatic fever     Age 30  . Pneumonia     RLL with sepsis  . Biceps tendon rupture     Bilateral  . Elevated liver enzymes     Biopsy consistent with steatohepatitis  . Right thyroid nodule   . Anxiety   . Lymphedema     Left arm  . Overactive bladder   . PSVT (paroxysmal supraventricular tachycardia)   . Osteoporosis   . Osteoarthritis   . Low back pain     scoliosis  . Hyperlipidemia   . GERD (gastroesophageal reflux disease) 06/2007    EGD  Dr Gala Romney >sm HH, multiple fundic gland polyps  . Type 2 diabetes mellitus   . Anemia     NOS  . Allergic rhinitis   . Insomnia   . Diverticulosis   . Hemorrhoids   . Hiatal hernia   . Steatohepatitis     liver bx  . Fundic gland polyps of stomach, benign   . Hx of radiation therapy 07/17/10 to 07/21/10    SRS LLL lung  . Breast cancer 2001    Left s/p lumpectomy and XRT   . Depression   . Glaucoma 2013    both eyes    Past Surgical History  Procedure Laterality Date  . Cataract extraction Bilateral 2003    Lens implant Dr. Charise Killian  . Cholecystectomy  1965  . Total hip arthroplasty Bilateral 2005 & 2007     Dr. Earl Lagos. Aline Brochure   . Abdominal hysterectomy  1964  . Breast lumpectomy  2001  . Cystoscopy  2007  . Breast biopsy      X 3 w/ cystectomy  . Revision total hip arthroplasty  2007    Left  . Liver biopsy  2008  . Colonoscopy  09/11/2011    Procedure: COLONOSCOPY;  Surgeon: Dorothyann Peng, MD;  Location: [facility];  Service: Endoscopy;  Laterality: N/A;  . Esophagogastroduodenoscopy  09/11/2011  Procedure: ESOPHAGOGASTRODUODENOSCOPY (EGD);  Surgeon: Dorothyann Peng, MD;  Location: [facility];  Service: Endoscopy;  Laterality: N/A;  . Colonoscopy  2005 /2012    Dr. Lucianne Muss- diverticulosis, ext hemorrhoids.  . Tonsillectomy  1930  . Appendectomy  1964    Family History  Problem Relation Age of Onset  . Heart failure Mother   . Osteoporosis Mother   . Hypertension Father   . Stroke Father   . Heart failure Father   . Leukemia Brother   . Heart failure Brother   . Cancer      History  Substance Use Topics  . Smoking status: Never Smoker   . Smokeless tobacco: Never Used     Comment: 2nd hand-husband  . Alcohol Use: No    OB History   Grav Para Term Preterm Abortions TAB SAB Ect Mult Living                  Review of Systems  Constitutional: Negative for fever, weight loss and diaphoresis.  HENT: Positive for rhinorrhea and postnasal drip.  Negative for ear pain, sore throat and trouble swallowing.   Respiratory: Positive for cough and wheezing. Negative for choking and shortness of breath.   Cardiovascular: Negative for chest pain.  Gastrointestinal: Negative for nausea and vomiting.  Genitourinary: Negative for dysuria.  Musculoskeletal: Negative for myalgias.  Skin: Negative for rash.  Neurological: Negative for dizziness and headaches.  All other systems reviewed and are negative.    Allergies  Ibuprofen; Naproxen; Statins; and Tape  Home Medications   Current Outpatient Rx  Name  Route  Sig  Dispense  Refill  . aspirin 81 MG EC tablet   Oral   Take 81 mg by mouth daily.           Marland Kitchen atenolol (TENORMIN) 25 MG tablet      TAKE (1/2) TABLET BY MOUTH DAILY.   30 tablet   12   . azithromycin (ZITHROMAX) 250 MG tablet      Take 2 pills by mouth on day 1, then 1 pill by mouth per day on days 2 through 5.   6 each   0   . Biotin 1000 MCG tablet   Oral   Take 1,000 mcg by mouth every evening.          . brimonidine (ALPHAGAN) 0.2 % ophthalmic solution   Left Eye   Place 1 drop into the left eye 2 (two) times daily.          . Calcium Carbonate-Vitamin D (CALTRATE 600+D) 600-400 MG-UNIT per tablet   Oral   Take 1 tablet by mouth daily.           Marland Kitchen dextran 70-hypromellose (TEARS RENEWED) ophthalmic solution   Both Eyes   Place 1 drop into both eyes 4 (four) times daily as needed. Dry eyes         . ferrous sulfate dried (SLOW FE) 160 (50 FE) MG TBCR   Oral   Take 160 mg by mouth daily.          . Fiber TABS   Oral   Take 4 tablets by mouth daily.         . folic acid (FOLVITE) 426 MCG tablet   Oral   Take 400 mcg by mouth daily.           Marland Kitchen guaiFENesin (MUCINEX) 600 MG 12 hr tablet   Oral   Take 1,200 mg by mouth  2 (two) times daily.         Marland Kitchen latanoprost (XALATAN) 0.005 % ophthalmic solution   Both Eyes   Place 1 drop into both eyes daily.          . Multiple Vitamin  (MULTIVITAMIN) capsule   Oral   Take 1 capsule by mouth daily.          . niacin (SLO-NIACIN) 500 MG tablet   Oral   Take 500 mg by mouth daily.           Marland Kitchen omeprazole (PRILOSEC) 20 MG capsule   Oral   Take 20 mg by mouth daily.           . vitamin B-12 (CYANOCOBALAMIN) 1000 MCG tablet   Oral   Take 1,000 mcg by mouth daily.         . Zoledronic Acid (ZOMETA IV)   Intravenous   Inject into the vein. Twice yearly           BP 134/69  Pulse 60  Temp(Src) 98.9 F (37.2 C) (Oral)  Resp 20  Wt 155 lb (70.308 kg)  BMI 26.59 kg/m2  SpO2 96%  Physical Exam  Nursing note and vitals reviewed. Constitutional: She is oriented to person, place, and time. She appears well-developed and well-nourished.  HENT:  Head: Normocephalic.  Nose: Rhinorrhea present. Right sinus exhibits no maxillary sinus tenderness and no frontal sinus tenderness. Left sinus exhibits no maxillary sinus tenderness and no frontal sinus tenderness.  Mouth/Throat: Oropharynx is clear and moist.  Eyes: Pupils are equal, round, and reactive to light.  Neck: Normal range of motion.  Cardiovascular: Normal rate and regular rhythm.   Pulmonary/Chest: Effort normal and breath sounds normal. No respiratory distress. She has no wheezes. She exhibits no tenderness.  Musculoskeletal: Normal range of motion. She exhibits no edema and no tenderness.  Neurological: She is oriented to person, place, and time.  Skin: Skin is warm and dry. No pallor.    ED Course  Procedures (including critical care time)  Labs Reviewed - No data to display Dg Chest 2 View  06/02/2013   *RADIOLOGY REPORT*  Clinical Data: Cough  CHEST - 2 VIEW  Comparison: 05/29/2013 , CT 01/27/2013  Findings: Normal cardiac silhouette.  There is bibasilar bronchiectasis unchanged from comparison chest radiograph or CT. This is most dense in the left lower lobe medially.  Lungs are hyperinflated.  No clear evidence of infiltrate.  No pleural fluid.  No pneumothorax.  IMPRESSION: Chronic bibasilar bronchiectasis and scarring.  No clear infiltrate.  Clinically significant discrepancy from primary report, if provided: None   Original Report Authenticated By: Suzy Bouchard, M.D.     No diagnosis found.    MDM   Patient states cough has decreased since being medicated  Understand to start taking her sudafed on a regular basis for the next several days as well        Garald Balding, NP 06/02/13 2309

## 2013-06-02 NOTE — ED Notes (Signed)
Pt states she has been coughing for the past couple of weeks  Pt states she went to urgent care on Friday and was given an antibiotic and cough medication but it has not helped  Pt states she is getting worse  Pt states she has been coughing constantly since yesterday morning and has not been able to sleep due to cough

## 2013-06-02 NOTE — Telephone Encounter (Signed)
Patient states she is not any better, coughed all day and night.  Antibiotics not working.   To weak to come back in, [facility].    Requests a different medication    Call her at [phone]

## 2013-06-02 NOTE — Patient Instructions (Signed)
Go to [facility] emergency room for further evaluation of your cough. I will call and advise then you are on the way. If follow up needed after the emergency room - we can see you here if unable to be seen by [facility] Adult medicine.

## 2013-06-11 ENCOUNTER — Non-Acute Institutional Stay: Payer: Medicare Other | Admitting: Internal Medicine

## 2013-06-11 ENCOUNTER — Encounter: Payer: Self-pay | Admitting: Internal Medicine

## 2013-06-11 VITALS — BP 104/64 | HR 60 | Temp 97.9°F | Ht 63.5 in | Wt 155.0 lb

## 2013-06-11 DIAGNOSIS — J479 Bronchiectasis, uncomplicated: Secondary | ICD-10-CM

## 2013-06-11 DIAGNOSIS — C3430 Malignant neoplasm of lower lobe, unspecified bronchus or lung: Secondary | ICD-10-CM | POA: Diagnosis not present

## 2013-06-11 DIAGNOSIS — R059 Cough, unspecified: Secondary | ICD-10-CM

## 2013-06-11 DIAGNOSIS — R05 Cough: Secondary | ICD-10-CM | POA: Diagnosis not present

## 2013-06-11 DIAGNOSIS — K219 Gastro-esophageal reflux disease without esophagitis: Secondary | ICD-10-CM

## 2013-06-11 NOTE — Progress Notes (Signed)
Subjective:    Patient ID: [name], female    DOB: [birth year], 79 y.o.   MRN: [id number]  HPI  Patient was seen at urgent care on 05/29/2013 and given a Z-Pak. Her cough is not improved. She is now here for the persistent cough. Nonproductive. Present for 3 weeks. Delsym did not help. Tussionex helps. Afebrile. Today is the first day she has felt good in the alst 3-4 weeks.  Her chest x-ray showed bronchiectasis. She was sent to [facility] Emergency Room 06/02/2013 for her persistent cough. Chest x-ray was done for the possibility of pneumonia. There was no evidence of pneumonia.  She has a history of lung cancer in 2011. She was treated with radiation. There is no known recurrence. She had a CT of her chest 01/27/2013 which did not show any cancer recurrence.  Uses omeprazole for indigestion. Denies increase in reflux.  Review of Systems  Constitutional: Negative for fever, chills, appetite change and unexpected weight change.  HENT: Negative for ear pain, congestion, facial swelling, rhinorrhea, neck pain, neck stiffness, postnasal drip, tinnitus and ear discharge.   Eyes: Negative.   Respiratory: Positive for cough. Negative for choking, chest tightness, shortness of breath, wheezing and stridor.   Cardiovascular: Negative.   Gastrointestinal: Negative.   Endocrine: Negative for cold intolerance, heat intolerance, polydipsia, polyphagia and polyuria.  Genitourinary: Negative.   Musculoskeletal: Negative.   Skin: Negative.   Neurological: Negative.   Hematological: Negative for adenopathy. Does not bruise/bleed easily.  Psychiatric/Behavioral: Negative.        Objective:   Physical Exam  Constitutional: She is oriented to person, place, and time. She appears well-developed and well-nourished. No distress.  HENT:  Right Ear: External ear normal.  Left Ear: External ear normal.  Nose: Nose normal.  Mouth/Throat: Oropharynx is clear and moist. No oropharyngeal exudate.   Eyes: Conjunctivae and EOM are normal. Pupils are equal, round, and reactive to light.  Neck: Normal range of motion. Neck supple. No JVD present. No tracheal deviation present. No thyromegaly present.  Cardiovascular: Normal rate, regular rhythm and normal heart sounds.  Exam reveals no gallop and no friction rub.   No murmur heard. Pulmonary/Chest: Effort normal. No respiratory distress. She has no wheezes. She has rales. She exhibits no tenderness.  Abdominal: Soft. Bowel sounds are normal. She exhibits no distension and no mass. There is no tenderness. There is no rebound.  Musculoskeletal: Normal range of motion. She exhibits no edema and no tenderness.  Lymphadenopathy:    She has no cervical adenopathy.  Neurological: She is alert and oriented to person, place, and time. She has normal reflexes. No cranial nerve deficit.  Skin: Skin is warm and dry. No rash noted. No erythema. No pallor.  Psychiatric: She has a normal mood and affect. Her behavior is normal. Judgment and thought content normal.          Assessment & Plan:  Cough: Based on today's history, this may be getting a little bit better. I recommended she continue to use her Tussionex as needed. She will call if she becomes febrile or her cough gets worse.  Bronchiectasis: Diagnosed per chest x-ray and report. No further action at this time. It is possible this is associated with her cough.  Malignant neoplasm of left lower lobe lung: No known recurrence. I am skeptical the cough has anything to do with  this previous diagnosis.  GERD: Asymptomatic. It is possible this is also associated with a cough, but again I  am skeptical.

## 2013-06-11 NOTE — Patient Instructions (Addendum)
Continue current medications. Use Tussionex as needed to control cough.

## 2013-06-14 ENCOUNTER — Encounter: Payer: Self-pay | Admitting: Internal Medicine

## 2013-06-14 DIAGNOSIS — R05 Cough: Secondary | ICD-10-CM | POA: Insufficient documentation

## 2013-06-14 DIAGNOSIS — J479 Bronchiectasis, uncomplicated: Secondary | ICD-10-CM | POA: Insufficient documentation

## 2013-06-14 DIAGNOSIS — R059 Cough, unspecified: Secondary | ICD-10-CM | POA: Insufficient documentation

## 2013-06-24 ENCOUNTER — Other Ambulatory Visit: Payer: Self-pay | Admitting: Geriatric Medicine

## 2013-07-13 DIAGNOSIS — H4011X0 Primary open-angle glaucoma, stage unspecified: Secondary | ICD-10-CM | POA: Diagnosis not present

## 2013-07-13 DIAGNOSIS — H43819 Vitreous degeneration, unspecified eye: Secondary | ICD-10-CM | POA: Diagnosis not present

## 2013-07-13 DIAGNOSIS — H26499 Other secondary cataract, unspecified eye: Secondary | ICD-10-CM | POA: Diagnosis not present

## 2013-07-13 DIAGNOSIS — H3530 Unspecified macular degeneration: Secondary | ICD-10-CM | POA: Diagnosis not present

## 2013-07-14 DIAGNOSIS — R29818 Other symptoms and signs involving the nervous system: Secondary | ICD-10-CM | POA: Diagnosis not present

## 2013-07-20 ENCOUNTER — Ambulatory Visit: Payer: Medicare Other

## 2013-07-20 ENCOUNTER — Ambulatory Visit (INDEPENDENT_AMBULATORY_CARE_PROVIDER_SITE_OTHER): Payer: Medicare Other | Admitting: Emergency Medicine

## 2013-07-20 VITALS — BP 126/74 | HR 75 | Temp 98.1°F | Resp 18 | Ht 64.0 in | Wt 154.0 lb

## 2013-07-20 DIAGNOSIS — M25571 Pain in right ankle and joints of right foot: Secondary | ICD-10-CM

## 2013-07-20 DIAGNOSIS — M25579 Pain in unspecified ankle and joints of unspecified foot: Secondary | ICD-10-CM

## 2013-07-20 DIAGNOSIS — M25572 Pain in left ankle and joints of left foot: Secondary | ICD-10-CM

## 2013-07-20 LAB — POCT CBC
Granulocyte percent: 76.7 %G (ref 37–80)
HCT, POC: 38.3 % (ref 37.7–47.9)
Hemoglobin: 12.3 g/dL (ref 12.2–16.2)
MCH, POC: 31.7 pg — AB (ref 27–31.2)
MCV: 98.8 fL — AB (ref 80–97)
MID (cbc): 0.7 (ref 0–0.9)
Platelet Count, POC: 253 10*3/uL (ref 142–424)
RBC: 3.88 M/uL — AB (ref 4.04–5.48)
WBC: 8.1 10*3/uL (ref 4.6–10.2)

## 2013-07-20 NOTE — Progress Notes (Signed)
  Subjective:    Patient ID: [name], female    DOB: May 17, 1921, 79 y.o.   MRN: [id number]  HPI  79 YO female patient comes in today with left foot pain that started Thursday morning. She was thinking the pain felt like she may stubbed her toe the day before. She does not recall hitting her toe. The first toe and joint started swelling Friday. Patient states she does not have a history of gout. [facility] at [facility] who suggested it may be a flare up of gout.   She has in the [place] for the past 4 months. She states her diet has changed. She usually did not eat meat. Now she is eating meat once a day. She also states her water intake has decreased.  She has tried elevating her foot and used Aspercream on it.   The swelling has gone down a little today and the pain is not too bad. She uses a walker for stability. She is not able to walk without the support due to muscle weakness and hip replacements.   Review of Systems     Objective:   Physical Exam patient is alert and cooperative she is not in any distress. Examination of the right foot reveals a prominent distal first metacarpal. There is mild pain with flexion extension of the toe. Dorsalis pedis pulses 2+  [facility] reading (PRIMARY) by  Dr.Adelai Achey  there is a significant hallux valgus deformity. There severe osteoporosis. No fractures identified  Results for orders placed in visit on 07/20/13  POCT CBC      Result Value Range   WBC 8.1  4.6 - 10.2 K/uL   Lymph, poc 1.2  0.6 - 3.4   POC LYMPH PERCENT 15.2  10 - 50 %L   MID (cbc) 0.7  0 - 0.9   POC MID % 8.1  0 - 12 %M   POC Granulocyte 6.2  2 - 6.9   Granulocyte percent 76.7  37 - 80 %G   RBC 3.88 (*) 4.04 - 5.48 M/uL   Hemoglobin 12.3  12.2 - 16.2 g/dL   HCT, POC 38.3  37.7 - 47.9 %   MCV 98.8 (*) 80 - 97 fL   MCH, POC 31.7 (*) 27 - 31.2 pg   MCHC 32.1  31.8 - 35.4 g/dL   RDW, POC 13.5     Platelet Count, POC 253  142 - 424 K/uL   MPV 10.3  0 - 99.8 fL       Assessment & Plan:  We'll check a CBC and uric acid to also check films of the foot and toe. She will take Tylenol for pain I will call her when I get back her uric acid level CBC is normal no fracture seen on her films

## 2013-07-21 ENCOUNTER — Telehealth: Payer: Self-pay

## 2013-07-21 LAB — URIC ACID: Uric Acid, Serum: 6.1 mg/dL (ref 2.4–7.0)

## 2013-07-21 NOTE — Telephone Encounter (Signed)
See labs. Pt notified of labs. Can you please review xrays. Thanks. Ok to [facility]

## 2013-07-21 NOTE — Telephone Encounter (Signed)
Pt is calling to see if we have gotten her labs back from yesterday and if the radiologist has gotten back on her xrays.  She is wanting to know what is causing her  Foot to swell.  Best number [phone]

## 2013-07-22 NOTE — Telephone Encounter (Signed)
I have spoken to patient, she indicates this has gotten better. She will call back if she desires the ortho referral, and we can do this for her. To you FYI

## 2013-07-22 NOTE — Telephone Encounter (Signed)
Please call patient letter no her x-rays are normal and her uric acid is normal. Would she like me to give her over to see the orthopedist and get her his opinion regarding her foot problem. If she is willing call  [facility] and get her an appointment this week .

## 2013-07-22 NOTE — Telephone Encounter (Signed)
Thank you. I have called her left message for her to call me back.

## 2013-07-23 DIAGNOSIS — E785 Hyperlipidemia, unspecified: Secondary | ICD-10-CM | POA: Diagnosis not present

## 2013-07-23 DIAGNOSIS — D649 Anemia, unspecified: Secondary | ICD-10-CM | POA: Diagnosis not present

## 2013-07-23 DIAGNOSIS — E119 Type 2 diabetes mellitus without complications: Secondary | ICD-10-CM | POA: Diagnosis not present

## 2013-07-23 DIAGNOSIS — R748 Abnormal levels of other serum enzymes: Secondary | ICD-10-CM | POA: Diagnosis not present

## 2013-07-23 LAB — BASIC METABOLIC PANEL: BUN: 22 mg/dL — AB (ref 4–21)

## 2013-07-23 LAB — LIPID PANEL
LDL Cholesterol: 114 mg/dL
Triglycerides: 86 mg/dL (ref 40–160)

## 2013-07-23 LAB — CBC AND DIFFERENTIAL
HCT: 35 % — AB (ref 36–46)
Platelets: 266 10*3/uL (ref 150–399)
WBC: 5.6 10^3/mL

## 2013-07-23 LAB — HEPATIC FUNCTION PANEL: Bilirubin, Total: 0.4 mg/dL

## 2013-07-30 ENCOUNTER — Non-Acute Institutional Stay: Payer: Medicare Other | Admitting: Nurse Practitioner

## 2013-07-30 ENCOUNTER — Encounter: Payer: Self-pay | Admitting: Nurse Practitioner

## 2013-07-30 VITALS — BP 110/62 | HR 72 | Ht 64.0 in | Wt 155.0 lb

## 2013-07-30 DIAGNOSIS — M810 Age-related osteoporosis without current pathological fracture: Secondary | ICD-10-CM | POA: Diagnosis not present

## 2013-07-30 DIAGNOSIS — R059 Cough, unspecified: Secondary | ICD-10-CM

## 2013-07-30 DIAGNOSIS — D649 Anemia, unspecified: Secondary | ICD-10-CM | POA: Diagnosis not present

## 2013-07-30 DIAGNOSIS — I471 Supraventricular tachycardia: Secondary | ICD-10-CM | POA: Diagnosis not present

## 2013-07-30 DIAGNOSIS — C50919 Malignant neoplasm of unspecified site of unspecified female breast: Secondary | ICD-10-CM

## 2013-07-30 DIAGNOSIS — R05 Cough: Secondary | ICD-10-CM

## 2013-07-30 DIAGNOSIS — C50912 Malignant neoplasm of unspecified site of left female breast: Secondary | ICD-10-CM

## 2013-07-30 DIAGNOSIS — M1990 Unspecified osteoarthritis, unspecified site: Secondary | ICD-10-CM

## 2013-07-30 DIAGNOSIS — K219 Gastro-esophageal reflux disease without esophagitis: Secondary | ICD-10-CM

## 2013-07-30 MED ORDER — ATENOLOL 25 MG PO TABS: ORAL_TABLET | ORAL | Status: DC

## 2013-07-30 NOTE — Assessment & Plan Note (Signed)
Resolved. Had EKG 06/02/13 at local urgent care and subsequently was sent to ED ruled out PNA and CHF.

## 2013-07-30 NOTE — Assessment & Plan Note (Addendum)
Resolved left 1st MTJ pain-lasted 3-4 days and ruled out gout with normal uric acid 6.1 07/20/13

## 2013-07-30 NOTE — Assessment & Plan Note (Signed)
Last mammogram was year ago. She stated she is not going to have surgery, radiation, or chemo therapy in the future. The patient agreed to observe and may repeat Mammogram in 2 years for surveillance purpose. Had last CT 20/2014 chest and will be at yearly bases.

## 2013-07-30 NOTE — Assessment & Plan Note (Signed)
Takes Zoledronic acid 36m IV yearly and Bone density every 2 years.

## 2013-07-30 NOTE — Assessment & Plan Note (Signed)
Stable on Omeprazole.

## 2013-07-30 NOTE — Assessment & Plan Note (Signed)
Rate controlled on Atenolol 12.67m daily.

## 2013-07-30 NOTE — Assessment & Plan Note (Addendum)
Takes Ferrous Fulfate 148m, B12 1044m, Folate 4009mdaily. Hgb 11.9 07/23/13

## 2013-07-30 NOTE — Progress Notes (Signed)
Patient ID: [name], female   DOB: February 19, 1921, 79 y.o.   MRN: [id number]  Allergies  Allergen Reactions  . Ibuprofen Hives  . Naproxen   . Statins Other (See Comments)    Feels like knives in stomach  . Surgilube [Gyne-Moistrin]     Rectal itching, burning  . Tape Rash    Chief Complaint  Patient presents with  . Medical Managment of Chronic Issues    blood pressure, anemia, cholessterol. Was seen 07/20/13 at Urgent Care for left foot/toe pain, swollen. lab and x-ray were normal Uric Acid 6.1.    HPI: Patient is a 79 y.o. female seen in the clinic at [facility] today for for evaluation of he chronic medical conditions.  Problem List Items Addressed This Visit   Anemia   ANEMIA-NOS     Takes Ferrous Fulfate 166m, B12 1085m, Folate 40031mdaily. Hgb 11.9 07/23/13    Breast cancer - Primary     Last mammogram was year ago. She stated she is not going to have surgery, radiation, or chemo therapy in the future. The patient agreed to observe and may repeat Mammogram in 2 years for surveillance purpose. Had last CT 20/2014 chest and will be at yearly bases.     Cough     Resolved. Had EKG 06/02/13 at local urgent care and subsequently was sent to ED ruled out PNA and CHF.     GERD (Chronic)     Stable on Omeprazole.       OSTEOARTHRITIS     Resolved left 1st MTJ pain-lasted 3-4 days and ruled out gout with normal uric acid 6.1 07/20/13    OSTEOPOROSIS     Takes Zoledronic acid 5mg46m yearly and Bone density every 2 years.       PSVT (paroxysmal supraventricular tachycardia)     Rate controlled on Atenolol 12.5mg 19mly.     Relevant Medications      atenolol (TENORMIN) tablet      Review of Systems:  Review of Systems  Constitutional: Negative for fever, chills, weight loss, malaise/fatigue and diaphoresis.  HENT: Positive for hearing loss. Negative for ear pain, congestion, sore throat and neck pain.   Eyes: Negative for blurred vision, double vision,  photophobia, pain, discharge and redness.  Respiratory: Negative for cough, hemoptysis, sputum production, shortness of breath, wheezing and stridor.   Cardiovascular: Negative for chest pain, palpitations, orthopnea, claudication and leg swelling.  Gastrointestinal: Negative for heartburn, nausea, vomiting, abdominal pain, diarrhea, constipation and blood in stool.  Genitourinary: Negative for dysuria, urgency, frequency, hematuria and flank pain.  Musculoskeletal: Positive for back pain. Negative for myalgias and falls. Joint pain: resolved left 1st MTJ pain.   Skin: Negative for rash.  Neurological: Negative for dizziness, tingling, tremors, sensory change, speech change, focal weakness, seizures, loss of consciousness, weakness and headaches.  Endo/Heme/Allergies: Negative for environmental allergies and polydipsia. Does not bruise/bleed easily.  Psychiatric/Behavioral: Negative for depression, hallucinations and memory loss. The patient is not nervous/anxious and does not have insomnia.      Past Medical History  Diagnosis Date  . Lung cancer 05/2010    Left 2011 - rad x 3  . Rheumatic fever     Age 60  .75neumonia     RLL with sepsis  . Biceps tendon rupture     Bilateral  . Elevated liver enzymes     Biopsy consistent with steatohepatitis  . Right thyroid nodule   . Anxiety   . Lymphedema  Left arm  . Overactive bladder   . PSVT (paroxysmal supraventricular tachycardia)   . Osteoporosis   . Osteoarthritis   . Low back pain     scoliosis  . Hyperlipidemia   . GERD (gastroesophageal reflux disease) 06/2007    EGD Dr Gala Romney >sm HH, multiple fundic gland polyps  . Type 2 diabetes mellitus   . Anemia     NOS  . Allergic rhinitis   . Insomnia   . Diverticulosis   . Hemorrhoids   . Hiatal hernia   . Steatohepatitis     liver bx  . Fundic gland polyps of stomach, benign   . Hx of radiation therapy 07/17/10 to 07/21/10    SRS LLL lung  . Breast cancer 2001    Left s/p  lumpectomy and XRT   . Depression   . Glaucoma 2013    both eyes  . Cough 2014  . Bronchiectasis 2014    Noted on chest x-ray   Past Surgical History  Procedure Laterality Date  . Cataract extraction Bilateral 2003    Lens implant Dr. Charise Killian  . Cholecystectomy  1965  . Total hip arthroplasty Bilateral 2005 & 2007     Dr. Earl Lagos. Aline Brochure   . Abdominal hysterectomy  1964  . Breast lumpectomy  2001  . Cystoscopy  2007  . Breast biopsy      X 3 w/ cystectomy  . Revision total hip arthroplasty  2007    Left  . Liver biopsy  2008  . Colonoscopy  09/11/2011    Procedure: COLONOSCOPY;  Surgeon: Dorothyann Peng, MD;  [facility];  Service: Endoscopy;  Laterality: N/A;  . Esophagogastroduodenoscopy  09/11/2011    Procedure: ESOPHAGOGASTRODUODENOSCOPY (EGD);  Surgeon: Dorothyann Peng, MD;  Location: [facility];  Service: Endoscopy;  Laterality: N/A;  . Colonoscopy  2005 /2012    Dr. Lucianne Muss- diverticulosis, ext hemorrhoids.  . Tonsillectomy  1930  . Appendectomy  1964   Social History:   reports that she has never smoked. She has never used smokeless tobacco. She reports that she does not drink alcohol or use illicit drugs.  Family History  Problem Relation Age of Onset  . Heart failure Mother   . Osteoporosis Mother   . Hypertension Father   . Stroke Father   . Heart failure Father   . Leukemia Brother   . Heart failure Brother   . Cancer    . Cancer Maternal Grandmother   . Stroke Maternal Grandfather     Medications: Patient's Medications  New Prescriptions   No medications on file  Previous Medications   ASPIRIN 81 MG EC TABLET    Take 81 mg by mouth daily.     BIOTIN 1000 MCG TABLET    Take 1,000 mcg by mouth every evening.    BRIMONIDINE (ALPHAGAN) 0.2 % OPHTHALMIC SOLUTION    Place 1 drop into both eyes. Once a day   CALCIUM CARBONATE-VITAMIN D (CALTRATE 600+D) 600-400 MG-UNIT PER TABLET    Take 1 tablet by mouth daily.     DEXTRAN 70-HYPROMELLOSE  (TEARS RENEWED) OPHTHALMIC SOLUTION    Place 1 drop into both eyes 4 (four) times daily as needed. Dry eyes   FERROUS SULFATE DRIED (SLOW FE) 160 (50 FE) MG TBCR    Take 160 mg by mouth daily.    FIBER TABS    Take 4 tablets by mouth daily.   FOLIC ACID (FOLVITE) 324 MCG TABLET  Take 400 mcg by mouth daily.     LATANOPROST (XALATAN) 0.005 % OPHTHALMIC SOLUTION    Place 1 drop into both eyes daily.    MULTIPLE VITAMIN (MULTIVITAMIN) CAPSULE    Take 1 capsule by mouth daily.    NIACIN (SLO-NIACIN) 500 MG TABLET    Take 500 mg by mouth daily.     OMEPRAZOLE (PRILOSEC) 20 MG CAPSULE    Take 20 mg by mouth daily.     PSEUDOEPHEDRINE-DM-GG (SUDAFED COUGH PO)    Take by mouth. Take one three times daily for post nasal drip   VITAMIN B-12 (CYANOCOBALAMIN) 1000 MCG TABLET    Take 1,000 mcg by mouth daily.  Modified Medications   Modified Medication Previous Medication   ATENOLOL (TENORMIN) 25 MG TABLET atenolol (TENORMIN) 25 MG tablet      Take 1/2 tablet daily for blood pressure    TAKE (1/2) TABLET BY MOUTH DAILY.  Discontinued Medications   CHLORPHENIRAMINE-HYDROCODONE (TUSSIONEX) 10-8 MG/5ML LQCR    Take 5 mLs by mouth every 12 (twelve) hours.     Physical Exam: Physical Exam  Constitutional: She is oriented to person, place, and time. She appears well-developed and well-nourished. No distress.  HENT:  Right Ear: External ear normal.  Left Ear: External ear normal.  Nose: Nose normal.  Mouth/Throat: Oropharynx is clear and moist. No oropharyngeal exudate.  Eyes: Conjunctivae and EOM are normal. Pupils are equal, round, and reactive to light.  Neck: Normal range of motion. Neck supple. No JVD present. No tracheal deviation present. No thyromegaly present.  Cardiovascular: Normal rate, regular rhythm and normal heart sounds.  Exam reveals no gallop and no friction rub.   No murmur heard. Pulmonary/Chest: Effort normal. No respiratory distress. She has no wheezes. She has rales. She exhibits  no tenderness.  Abdominal: Soft. Bowel sounds are normal. She exhibits no distension and no mass. There is no tenderness. There is no rebound.  Musculoskeletal: Normal range of motion. She exhibits no edema and no tenderness.  Lymphadenopathy:    She has no cervical adenopathy.  Neurological: She is alert and oriented to person, place, and time. She has normal reflexes. No cranial nerve deficit.  Skin: Skin is warm and dry. No rash noted. No erythema. No pallor.  Psychiatric: She has a normal mood and affect. Her behavior is normal. Judgment and thought content normal.    Filed Vitals:   07/30/13 1501  BP: 110/62  Pulse: 72  Height: 5' 4"  (1.626 m)  Weight: 155 lb (70.308 kg)    Labs reviewed: Basic Metabolic Panel:  Recent Labs  01/27/13 0927 07/23/13  NA 137 137  K 3.9 4.8  CL 97  --   CO2 28  --   GLUCOSE 109*  --   BUN 14 22*  CREATININE 1.10 1.1  CALCIUM 10.2  --    Liver Function Tests:  Recent Labs  01/27/13 0927 07/23/13  AST 25 28  ALT 17 17  ALKPHOS 81 71  BILITOT 0.4  --   PROT 7.7  --   ALBUMIN 3.5  --     Past Procedures: 01/27/13 CT chest:  IMPRESSION:  Chronic parenchymal lung changes likely related to prior radiation  therapy and scarring.  No evidence of recurrent or metastatic disease.     Assessment/Plan Breast cancer Last mammogram was year ago. She stated she is not going to have surgery, radiation, or chemo therapy in the future. The patient agreed to observe and may repeat Mammogram in  2 years for surveillance purpose. Had last CT 20/2014 chest and will be at yearly bases.   PSVT (paroxysmal supraventricular tachycardia) Rate controlled on Atenolol 12.29m daily.   OSTEOPOROSIS Takes Zoledronic acid 562mIV yearly and Bone density every 2 years.     GERD Stable on Omeprazole.     ANEMIA-NOS Takes Ferrous Fulfate 16035mB12 100m64mFolate 400mc32mily. Hgb 11.9 07/23/13  Cough Resolved. Had EKG 06/02/13 at local urgent care  and subsequently was sent to ED ruled out PNA and CHF.   OSTEOARTHRITIS Resolved left 1st MTJ pain-lasted 3-4 days and ruled out gout with normal uric acid 6.1 07/20/13    Family/ Staff Communication: observe the patient.   Goals of Care: [place]  Labs/tests ordered: none

## 2013-08-10 DIAGNOSIS — H26499 Other secondary cataract, unspecified eye: Secondary | ICD-10-CM | POA: Diagnosis not present

## 2013-08-10 DIAGNOSIS — H4011X0 Primary open-angle glaucoma, stage unspecified: Secondary | ICD-10-CM | POA: Diagnosis not present

## 2013-08-10 DIAGNOSIS — H43819 Vitreous degeneration, unspecified eye: Secondary | ICD-10-CM | POA: Diagnosis not present

## 2013-09-30 DIAGNOSIS — Z23 Encounter for immunization: Secondary | ICD-10-CM | POA: Diagnosis not present

## 2014-01-25 ENCOUNTER — Other Ambulatory Visit (HOSPITAL_COMMUNITY): Payer: Self-pay

## 2014-01-25 DIAGNOSIS — C50919 Malignant neoplasm of unspecified site of unspecified female breast: Secondary | ICD-10-CM

## 2014-01-25 DIAGNOSIS — C3430 Malignant neoplasm of lower lobe, unspecified bronchus or lung: Secondary | ICD-10-CM

## 2014-01-28 ENCOUNTER — Encounter: Payer: Self-pay | Admitting: Nurse Practitioner

## 2014-02-03 ENCOUNTER — Encounter (HOSPITAL_COMMUNITY): Payer: Medicare Other | Attending: Hematology and Oncology

## 2014-02-03 ENCOUNTER — Ambulatory Visit (HOSPITAL_COMMUNITY)
Admission: RE | Admit: 2014-02-03 | Discharge: 2014-02-03 | Disposition: A | Discharge status: Home / Self Care | Payer: Medicare Other | Source: Ambulatory Visit | Attending: Oncology | Admitting: Oncology

## 2014-02-03 ENCOUNTER — Ambulatory Visit (HOSPITAL_COMMUNITY): Payer: Medicare Other | Admitting: Oncology

## 2014-02-03 ENCOUNTER — Encounter (HOSPITAL_COMMUNITY): Payer: Self-pay

## 2014-02-03 ENCOUNTER — Encounter (HOSPITAL_BASED_OUTPATIENT_CLINIC_OR_DEPARTMENT_OTHER): Payer: Medicare Other

## 2014-02-03 VITALS — BP 113/56 | HR 62 | Temp 98.5°F | Resp 18 | Wt 154.4 lb

## 2014-02-03 DIAGNOSIS — J479 Bronchiectasis, uncomplicated: Secondary | ICD-10-CM | POA: Diagnosis not present

## 2014-02-03 DIAGNOSIS — G4700 Insomnia, unspecified: Secondary | ICD-10-CM | POA: Diagnosis not present

## 2014-02-03 DIAGNOSIS — E785 Hyperlipidemia, unspecified: Secondary | ICD-10-CM | POA: Diagnosis not present

## 2014-02-03 DIAGNOSIS — C50919 Malignant neoplasm of unspecified site of unspecified female breast: Secondary | ICD-10-CM

## 2014-02-03 DIAGNOSIS — F411 Generalized anxiety disorder: Secondary | ICD-10-CM | POA: Diagnosis not present

## 2014-02-03 DIAGNOSIS — K219 Gastro-esophageal reflux disease without esophagitis: Secondary | ICD-10-CM | POA: Diagnosis not present

## 2014-02-03 DIAGNOSIS — F3289 Other specified depressive episodes: Secondary | ICD-10-CM | POA: Insufficient documentation

## 2014-02-03 DIAGNOSIS — C3490 Malignant neoplasm of unspecified part of unspecified bronchus or lung: Secondary | ICD-10-CM | POA: Insufficient documentation

## 2014-02-03 DIAGNOSIS — D649 Anemia, unspecified: Secondary | ICD-10-CM | POA: Insufficient documentation

## 2014-02-03 DIAGNOSIS — I471 Supraventricular tachycardia, unspecified: Secondary | ICD-10-CM | POA: Insufficient documentation

## 2014-02-03 DIAGNOSIS — H409 Unspecified glaucoma: Secondary | ICD-10-CM | POA: Diagnosis not present

## 2014-02-03 DIAGNOSIS — M810 Age-related osteoporosis without current pathological fracture: Secondary | ICD-10-CM | POA: Diagnosis not present

## 2014-02-03 DIAGNOSIS — Z9010 Acquired absence of unspecified breast and nipple: Secondary | ICD-10-CM | POA: Diagnosis not present

## 2014-02-03 DIAGNOSIS — E119 Type 2 diabetes mellitus without complications: Secondary | ICD-10-CM | POA: Insufficient documentation

## 2014-02-03 DIAGNOSIS — C3430 Malignant neoplasm of lower lobe, unspecified bronchus or lung: Secondary | ICD-10-CM

## 2014-02-03 DIAGNOSIS — E041 Nontoxic single thyroid nodule: Secondary | ICD-10-CM | POA: Insufficient documentation

## 2014-02-03 DIAGNOSIS — Z09 Encounter for follow-up examination after completed treatment for conditions other than malignant neoplasm: Secondary | ICD-10-CM | POA: Insufficient documentation

## 2014-02-03 DIAGNOSIS — F329 Major depressive disorder, single episode, unspecified: Secondary | ICD-10-CM | POA: Insufficient documentation

## 2014-02-03 DIAGNOSIS — Z96649 Presence of unspecified artificial hip joint: Secondary | ICD-10-CM | POA: Diagnosis not present

## 2014-02-03 DIAGNOSIS — Z85118 Personal history of other malignant neoplasm of bronchus and lung: Secondary | ICD-10-CM | POA: Insufficient documentation

## 2014-02-03 DIAGNOSIS — Z923 Personal history of irradiation: Secondary | ICD-10-CM | POA: Insufficient documentation

## 2014-02-03 DIAGNOSIS — Z853 Personal history of malignant neoplasm of breast: Secondary | ICD-10-CM | POA: Diagnosis not present

## 2014-02-03 LAB — CBC WITH DIFFERENTIAL/PLATELET
Basophils Absolute: 0 K/uL (ref 0.0–0.1)
Basophils Relative: 0 % (ref 0–1)
Eosinophils Absolute: 0.2 K/uL (ref 0.0–0.7)
Eosinophils Relative: 2 % (ref 0–5)
HCT: 36.8 % (ref 36.0–46.0)
Hemoglobin: 12.2 g/dL (ref 12.0–15.0)
Lymphocytes Relative: 14 % (ref 12–46)
Lymphs Abs: 1.1 K/uL (ref 0.7–4.0)
MCH: 31.9 pg (ref 26.0–34.0)
MCHC: 33.2 g/dL (ref 30.0–36.0)
MCV: 96.1 fL (ref 78.0–100.0)
Monocytes Absolute: 0.9 K/uL (ref 0.1–1.0)
Monocytes Relative: 12 % (ref 3–12)
Neutro Abs: 5.3 K/uL (ref 1.7–7.7)
Neutrophils Relative %: 71 % (ref 43–77)
Platelets: 252 K/uL (ref 150–400)
RBC: 3.83 MIL/uL — ABNORMAL LOW (ref 3.87–5.11)
RDW: 13.5 % (ref 11.5–15.5)
WBC: 7.4 K/uL (ref 4.0–10.5)

## 2014-02-03 LAB — COMPREHENSIVE METABOLIC PANEL WITH GFR
ALT: 16 U/L (ref 0–35)
AST: 26 U/L (ref 0–37)
Albumin: 3.5 g/dL (ref 3.5–5.2)
Alkaline Phosphatase: 79 U/L (ref 39–117)
BUN: 16 mg/dL (ref 6–23)
CO2: 29 meq/L (ref 19–32)
Calcium: 10.1 mg/dL (ref 8.4–10.5)
Chloride: 96 meq/L (ref 96–112)
Creatinine, Ser: 1.16 mg/dL — ABNORMAL HIGH (ref 0.50–1.10)
GFR calc Af Amer: 47 mL/min — ABNORMAL LOW
GFR calc non Af Amer: 40 mL/min — ABNORMAL LOW
Glucose, Bld: 91 mg/dL (ref 70–99)
Potassium: 4.4 meq/L (ref 3.7–5.3)
Sodium: 136 meq/L — ABNORMAL LOW (ref 137–147)
Total Bilirubin: 0.3 mg/dL (ref 0.3–1.2)
Total Protein: 8.1 g/dL (ref 6.0–8.3)

## 2014-02-03 LAB — LACTATE DEHYDROGENASE: LDH: 175 U/L (ref 94–250)

## 2014-02-03 NOTE — Progress Notes (Signed)
[place]  OFFICE PROGRESS NOTE  GREEN, [name], MD [id number] [place] [address]  DIAGNOSIS: Malignant neoplasm of lower lobe, bronchus, or lung - Plan: Lactate dehydrogenase, CEA, Cancer antigen 27.29  Malignant neoplasm of breast (female), unspecified site - Plan: Lactate dehydrogenase, CEA, Cancer antigen 27.29  Chief Complaint  Patient presents with  . Lung Cancer  . Breast Cancer    CURRENT THERAPY: Watchful expectation and surveillance  INTERVAL HISTORY: [name] 80 y.o. female returns for followup of left breast cancer diagnosed in April of 2001 status post partial mastectomy, radiotherapy, and 5 years of anastrozole finishing in May of 2006 along with bronchoalveolar lung cancer, status post biopsy, treated with tomootherapy by Dr. Lisbeth Renshaw in August of 2011 having just undergone repeat CT scan this morning. 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 Appetite has been good with no nausea, vomiting, fever, night sweats, vaginal bleeding or discharge, melena, hematochezia, hematuria, epistaxis, incontinence, lower extremity swelling or redness, chest pain, PND, orthopnea, palpitations, or lymphedema.  MEDICAL HISTORY: Past Medical History  Diagnosis Date  . Lung cancer 05/2010    Left 2011 - rad x 3  . Rheumatic fever     Age 4  . Pneumonia     RLL with sepsis  . Biceps tendon rupture     Bilateral  . Elevated liver enzymes     Biopsy consistent with steatohepatitis  . Right thyroid nodule   . Anxiety   . Lymphedema     Left arm  . Overactive bladder   . PSVT (paroxysmal supraventricular tachycardia)   . Osteoporosis   . Osteoarthritis   . Low back pain     scoliosis  . Hyperlipidemia   . GERD (gastroesophageal reflux disease) 06/2007    EGD Dr Gala Romney >sm HH, multiple fundic gland polyps  . Type 2 diabetes mellitus   . Anemia    NOS  . Allergic rhinitis   . Insomnia   . Diverticulosis   . Hemorrhoids   . Hiatal hernia   . Steatohepatitis     liver bx  . Fundic gland polyps of stomach, benign   . Hx of radiation therapy 07/17/10 to 07/21/10    SRS LLL lung  . Breast cancer 2001    Left s/p lumpectomy and XRT   . Depression   . Glaucoma 2013    both eyes  . Cough 2014  . Bronchiectasis 2014    Noted on chest x-ray    INTERIM HISTORY: has THYROID NODULE, RIGHT; DIABETES MELLITUS, TYPE II; ADRENAL MASS, BILATERAL; HYPERLIPIDEMIA; ANEMIA-NOS; ANXIETY; INTERDIGITAL NEUROMA; PAROXYSMAL SUPRAVENTRICULAR TACHYCARDIA; ALLERGIC RHINITIS; GERD; OVERACTIVE BLADDER; OSTEOARTHRITIS; HIP, ARTHRITIS, DEGEN./OSTEO; HIP PAIN; LOW BACK PAIN; IMPINGEMENT SYNDROME; WEAKNESS, MUSCLE; OSTEOPOROSIS; FATIGUE; PALPITATIONS, RECURRENT; DYSPNEA; TOTAL HIP FOLLOW-UP; PSVT (paroxysmal supraventricular tachycardia); Malignant neoplasm of lower lobe, bronchus, or lung; Hx of radiation therapy; Breast cancer; Rotator cuff syndrome of right shoulder; Bronchiectasis; and Cough on her problem list.   #1 bronchoalveolar carcinoma left lung, stage I, lower lobe, status post radiation therapy via tomotherapy by Dr. Lisbeth Renshaw in August of 2011.  #2 stage I (T1 C., N0, M0) infiltrating ductal carcinoma left breast, 1.3 cm, ER +70%, PR +25%, Ki-67 marker low at 5%, HER-2/neu not amplified, though she did have LV I. 6 nodes were negative. She had a partial mastectomy on 04/03/2000 followed  by radiation therapy, followed by 5 years of anastrozole finishing in May 2006 thus far without recurrence  ALLERGIES:  is allergic to ibuprofen; naproxen; statins; surgilube; and tape.  MEDICATIONS: has a current medication list which includes the following prescription(s): artificial tear solution, aspirin, atenolol, biotin, brimonidine, calcium carbonate-vitamin d, docusate calcium, fiber, folic acid, iron, latanoprost, multivitamin, niacin, omeprazole, vitamin b-12,  zoledronic acid, and pseudoephedrine-dm-gg.  SURGICAL HISTORY:  Past Surgical History  Procedure Laterality Date  . Cataract extraction Bilateral 2003    Lens implant Dr. Charise Killian  . Cholecystectomy  1965  . Total hip arthroplasty Bilateral 2005 & 2007     Dr. Earl Lagos. Aline Brochure   . Abdominal hysterectomy  1964  . Breast lumpectomy  2001  . Cystoscopy  2007  . Breast biopsy      X 3 w/ cystectomy  . Revision total hip arthroplasty  2007    Left  . Liver biopsy  2008  . Colonoscopy  09/11/2011    Procedure: COLONOSCOPY;  Surgeon: Dorothyann Peng, MD;  [facility];  Service: Endoscopy;  Laterality: N/A;  . Esophagogastroduodenoscopy  09/11/2011    Procedure: ESOPHAGOGASTRODUODENOSCOPY (EGD);  Surgeon: Dorothyann Peng, MD;  [facility];  Service: Endoscopy;  Laterality: N/A;  . Colonoscopy  2005 /2012    Dr. Lucianne Muss- diverticulosis, ext hemorrhoids.  . Tonsillectomy  1930  . Appendectomy  1964  . Biopsy thyroid  11/2008  . Lung biopsy Left 06/06/2010    FAMILY HISTORY: family history includes Cancer in her maternal grandmother and another family member; Heart failure in her brother, father, and mother; Hypertension in her father; Leukemia in her brother; Osteoporosis in her mother; Stroke in her father and maternal grandfather.  SOCIAL HISTORY:  reports that she has never smoked. She has never used smokeless tobacco. She reports that she does not drink alcohol or use illicit drugs.  REVIEW OF SYSTEMS:  Other than that discussed above is noncontributory.  PHYSICAL EXAMINATION: ECOG PERFORMANCE STATUS: 1 - Symptomatic but completely ambulatory  Blood pressure 113/56, pulse 62, temperature 98.5 F (36.9 C), temperature source Oral, resp. rate 18, weight 154 lb 6.4 oz (70.035 kg), SpO2 97.00%.  GENERAL:alert, no distress and comfortable SKIN: skin color, texture, turgor are normal, no rashes or significant lesions EYES: PERLA; Conjunctiva are pink and  non-injected, sclera clear OROPHARYNX:no exudate, no erythema on lips, buccal mucosa, or tongue. NECK: supple, thyroid normal size, non-tender, without nodularity. No masses CHEST: Status post left breast lumpectomy with distortion of the breast but no evidence of masses on either side. LYMPH:  no palpable lymphadenopathy in the cervical, axillary or inguinal LUNGS: clear to auscultation and percussion with normal breathing effort HEART: regular rate & rhythm and no murmurs. ABDOMEN:abdomen soft, non-tender and normal bowel sounds MUSCULOSKELETAL:no cyanosis of digits and no clubbing. Range of motion normal.  NEURO: alert & oriented x 3 with fluent speech, no focal motor/sensory deficits   LABORATORY DATA: Infusion on 02/03/2014  Component Date Value Ref Range Status  . WBC 02/03/2014 7.4  4.0 - 10.5 K/uL Final  . RBC 02/03/2014 3.83* 3.87 - 5.11 MIL/uL Final  . Hemoglobin 02/03/2014 12.2  12.0 - 15.0 g/dL Final  . HCT 02/03/2014 36.8  36.0 - 46.0 % Final  . MCV 02/03/2014 96.1  78.0 - 100.0 fL Final  . MCH 02/03/2014 31.9  26.0 - 34.0 pg Final  . MCHC 02/03/2014 33.2  30.0 - 36.0 g/dL Final  . RDW 02/03/2014 13.5  11.5 -  15.5 % Final  . Platelets 02/03/2014 252  150 - 400 K/uL Final  . Neutrophils Relative % 02/03/2014 71  43 - 77 % Final  . Neutro Abs 02/03/2014 5.3  1.7 - 7.7 K/uL Final  . Lymphocytes Relative 02/03/2014 14  12 - 46 % Final  . Lymphs Abs 02/03/2014 1.1  0.7 - 4.0 K/uL Final  . Monocytes Relative 02/03/2014 12  3 - 12 % Final  . Monocytes Absolute 02/03/2014 0.9  0.1 - 1.0 K/uL Final  . Eosinophils Relative 02/03/2014 2  0 - 5 % Final  . Eosinophils Absolute 02/03/2014 0.2  0.0 - 0.7 K/uL Final  . Basophils Relative 02/03/2014 0  0 - 1 % Final  . Basophils Absolute 02/03/2014 0.0  0.0 - 0.1 K/uL Final  . Sodium 02/03/2014 136* 137 - 147 mEq/L Final  . Potassium 02/03/2014 4.4  3.7 - 5.3 mEq/L Final  . Chloride 02/03/2014 96  96 - 112 mEq/L Final  . CO2  02/03/2014 29  19 - 32 mEq/L Final  . Glucose, Bld 02/03/2014 91  70 - 99 mg/dL Final  . BUN 02/03/2014 16  6 - 23 mg/dL Final  . Creatinine, Ser 02/03/2014 1.16* 0.50 - 1.10 mg/dL Final  . Calcium 02/03/2014 10.1  8.4 - 10.5 mg/dL Final  . Total Protein 02/03/2014 8.1  6.0 - 8.3 g/dL Final  . Albumin 02/03/2014 3.5  3.5 - 5.2 g/dL Final  . AST 02/03/2014 26  0 - 37 U/L Final  . ALT 02/03/2014 16  0 - 35 U/L Final  . Alkaline Phosphatase 02/03/2014 79  39 - 117 U/L Final  . Total Bilirubin 02/03/2014 0.3  0.3 - 1.2 mg/dL Final  . GFR calc non Af Amer 02/03/2014 40* >90 mL/min Final  . GFR calc Af Amer 02/03/2014 47* >90 mL/min Final   Comment: (NOTE)                          The eGFR has been calculated using the CKD EPI equation.                          This calculation has not been validated in all clinical situations.                          eGFR's persistently <90 mL/min signify possible Chronic Kidney                          Disease.  Marland Kitchen LDH 02/03/2014 175  94 - 250 U/L Final    PATHOLOGY:  Surgical pathology converted EChart   Status: Final result Visible to patient: This result is not viewable by the patient. Next appt: 03/25/2014 at 01:45 PM in [place] (MAST, Betsey Amen, NP)            Result Narrative    [facility] [address], [place], [place] [address] Telephone [phone] or [phone] Fax [phone]  REPORT OF SURGICAL PATHOLOGY  Case #: [id number] Patient Name: HALLA, CHOPP Office Chart Number: [id number]  MRN: [id number] Pathologist: Nicoletta Dress M.D., Gretel Acre DOB/Age 06-Apr-1921 (Age: 89) Gender: F Date Taken: 06/06/2010 Date Received: 06/06/2010  FINAL DIAGNOSIS Microscopic Examination and Diagnosis  1. LUNG, NEEDLE/CORE BIOPSY(IES), LEFT LOWER : ATYPICAL GLANDULAR CELLS PRESENT, CONSISTENT WITH WELL DIFFERENTIATED ADENOCARCINOMA  DATE REPORTED: 06/07/2010  Electronically Signed Out by Nicoletta Dress M.D., Gretel Acre,  Pathologist, Electronic Signature   CLINICAL HISTORY       Urinalysis No results found for this basename: colorurine,  appearanceur,  labspec,  phurine,  glucoseu,  hgbur,  bilirubinur,  ketonesur,  proteinur,  urobilinogen,  nitrite,  leukocytesur    RADIOGRAPHIC STUDIES: Ct Chest Wo Contrast  02/03/2014   CLINICAL DATA:  Followup left lung carcinoma. Status post radiation therapy.  EXAM: CT CHEST WITHOUT CONTRAST  TECHNIQUE: Multidetector CT imaging of the chest was performed following the standard protocol without IV contrast.  COMPARISON:  01/27/2013  FINDINGS: Areas of peripheral pleural-parenchymal scarring and associated bronchiectasis are again seen bilaterally, mainly in the right middle lobe and lingula. A more severe area of parenchymal scarring and associated volume loss is again seen in the left lower lobe, without minimal change. No new or enlarging pulmonary nodules or masses are identified. No evidence of acute infiltrate or pleural effusion. No evidence central endobronchial obstruction.  No evidence of hilar or mediastinal masses. No adenopathy seen elsewhere within the thorax. No suspicious bone lesions identified. Both adrenal glands are normal in appearance.  IMPRESSION: Stable bilateral pleural-parenchymal scarring most severe in the left lower lobe. No evidence of recurrent or metastatic carcinoma within the thorax.   Electronically Signed   By: Earle Gell M.D.   On: 02/03/2014 09:31    ASSESSMENT:  #1.Bronchoalveolar carcinoma left lung, stage I, lower lobe, status post radiation therapy via tomotherapy by Dr. Lisbeth Renshaw in August of 2011. CAT scan done today normal. #2. stage I (T1 C., N0, M0) infiltrating ductal carcinoma left breast, 1.3 cm, ER +70%, PR +25%, Ki-67 marker low at 5%, HER-2/neu not amplified, though she did have LV I. 6 nodes were negative. She had a partial mastectomy on 04/03/2000 followed by radiation therapy, followed by 5 years of anastrozole finishing in  May 2006 thus far without recurrence. #3. Bilateral hip replacements, using a walker. #4. Rheumatic fever at age 59. #5. Biceps tendon rupture, status post repair. #6. Status post cholecystectomy 1965.   PLAN:  #1. Continue watchful expectation. #2. Patient was encouraged to perform monthly self breast examination.  #3. Followup in one year with lab tests, CT scans, and examination on the same day.   All questions were answered. The patient knows to call the clinic with any problems, questions or concerns. We can certainly see the patient much sooner if necessary.   I spent 25 minutes counseling the patient face to face. The total time spent in the appointment was 30 minutes.    Doroteo Bradford, MD 02/03/2014 10:08 AM

## 2014-02-03 NOTE — Addendum Note (Signed)
Addended by: Mellissa Kohut on: 02/03/2014 10:51 AM   Modules accepted: Orders

## 2014-02-03 NOTE — Progress Notes (Signed)
Labs drawn today for cea,[id number],ldh,cmp,cbc/diff

## 2014-02-03 NOTE — Patient Instructions (Addendum)
[place] Discharge Instructions  RECOMMENDATIONS MADE BY THE CONSULTANT AND ANY TEST RESULTS WILL BE SENT TO YOUR REFERRING PHYSICIAN.  EXAM FINDINGS BY THE PHYSICIAN TODAY AND SIGNS OR SYMPTOMS TO REPORT TO CLINIC OR PRIMARY PHYSICIAN: Exam and findings as discussed by Barnet Glasgow.  You are doing well.  If there are any issues with your test results we will let you know.  Report any new lumps, bone pain, shortness of breath or other symptoms.  MEDICATIONS PRESCRIBED:  none  INSTRUCTIONS/FOLLOW-UP: Follow-up in 1 year with CT of chest, labs and office visit.  Thank you for choosing [facility] to provide your oncology and hematology care.  To afford each patient quality time with our providers, please arrive at least 15 minutes before your scheduled appointment time.  With your help, our goal is to use those 15 minutes to complete the necessary work-up to ensure our physicians have the information they need to help with your evaluation and healthcare recommendations.    Effective January 1st, 2014, we ask that you re-schedule your appointment with our physicians should you arrive 10 or more minutes late for your appointment.  We strive to give you quality time with our providers, and arriving late affects you and other patients whose appointments are after yours.    Again, thank you for choosing [facility].  Our hope is that these requests will decrease the amount of time that you wait before being seen by our physicians.       _____________________________________________________________  Should you have questions after your visit to [facility], please contact our office at [phone] between the hours of 8:30 a.m. and 5:00 p.m.  Voicemails left after 4:30 p.m. will not be returned until the following business day.  For prescription refill requests, have your pharmacy contact our office with your prescription refill request.

## 2014-02-04 LAB — CEA: CEA: 2.4 ng/mL (ref 0.0–5.0)

## 2014-02-04 LAB — CANCER ANTIGEN 27.29: CA 27.29: 49 U/mL — ABNORMAL HIGH (ref 0–39)

## 2014-02-08 ENCOUNTER — Ambulatory Visit: Payer: Medicare Other

## 2014-02-08 ENCOUNTER — Ambulatory Visit (INDEPENDENT_AMBULATORY_CARE_PROVIDER_SITE_OTHER): Payer: Medicare Other | Admitting: Family Medicine

## 2014-02-08 VITALS — BP 112/70 | HR 80 | Temp 98.1°F | Resp 16 | Ht 63.5 in | Wt 156.0 lb

## 2014-02-08 DIAGNOSIS — M129 Arthropathy, unspecified: Secondary | ICD-10-CM

## 2014-02-08 DIAGNOSIS — M79609 Pain in unspecified limb: Secondary | ICD-10-CM | POA: Diagnosis not present

## 2014-02-08 DIAGNOSIS — T148XXA Other injury of unspecified body region, initial encounter: Secondary | ICD-10-CM

## 2014-02-08 DIAGNOSIS — M79641 Pain in right hand: Secondary | ICD-10-CM

## 2014-02-08 DIAGNOSIS — M25531 Pain in right wrist: Secondary | ICD-10-CM

## 2014-02-08 DIAGNOSIS — M25539 Pain in unspecified wrist: Secondary | ICD-10-CM | POA: Diagnosis not present

## 2014-02-08 DIAGNOSIS — M1990 Unspecified osteoarthritis, unspecified site: Secondary | ICD-10-CM

## 2014-02-08 NOTE — Progress Notes (Signed)
Chief Complaint:  Chief Complaint  Patient presents with  . Wrist Pain    right pain  since friday    HPI: [name] is a 80 y.o. right hand dominant female who is here for right hand and wrist pain . She was having pain at the base of her right thumb and as a result was putting all her weight while using her walker on her the outer area of her hand, then about 3 days ago she felt a pop aling the outer 5th digit area of her hand when she was putting pressure on it to move around with her walker since her thumb was giving her so much pain.. She has had baseline , constant pain 3/10 aching pain and sometimes 6-7/10 sharp pain with certain movement and pressure since 3 days ago. Prior to this she had pain at the base of her thumb for about 2-4 weeks. She has tried wraping it and also some otc cream.rub. She has had no falls. Denies numbness, weakness or tingling. She had prior injuries. She has arthritis and osteopenia.   Past Medical History  Diagnosis Date  . Lung cancer 05/2010    Left 2011 - rad x 3  . Rheumatic fever     Age 80  . Pneumonia     RLL with sepsis  . Biceps tendon rupture     Bilateral  . Elevated liver enzymes     Biopsy consistent with steatohepatitis  . Right thyroid nodule   . Anxiety   . Lymphedema     Left arm  . Overactive bladder   . PSVT (paroxysmal supraventricular tachycardia)   . Osteoporosis   . Osteoarthritis   . Low back pain     scoliosis  . Hyperlipidemia   . GERD (gastroesophageal reflux disease) 06/2007    EGD Dr Gala Romney >sm HH, multiple fundic gland polyps  . Type 2 diabetes mellitus   . Anemia     NOS  . Allergic rhinitis   . Insomnia   . Diverticulosis   . Hemorrhoids   . Hiatal hernia   . Steatohepatitis     liver bx  . Fundic gland polyps of stomach, benign   . Hx of radiation therapy 07/17/10 to 07/21/10    SRS LLL lung  . Breast cancer 2001    Left s/p lumpectomy and XRT   . Depression   . Glaucoma 2013    both eyes  .  Cough 2014  . Bronchiectasis 2014    Noted on chest x-ray   Past Surgical History  Procedure Laterality Date  . Cataract extraction Bilateral 2003    Lens implant Dr. Charise Killian  . Cholecystectomy  1965  . Total hip arthroplasty Bilateral 2005 & 2007     Dr. Earl Lagos. Aline Brochure   . Abdominal hysterectomy  1964  . Breast lumpectomy  2001  . Cystoscopy  2007  . Breast biopsy      X 3 w/ cystectomy  . Revision total hip arthroplasty  2007    Left  . Liver biopsy  2008  . Colonoscopy  09/11/2011    Procedure: COLONOSCOPY;  Surgeon: Dorothyann Peng, MD;  [facility];  Service: Endoscopy;  Laterality: N/A;  . Esophagogastroduodenoscopy  09/11/2011    Procedure: ESOPHAGOGASTRODUODENOSCOPY (EGD);  Surgeon: Dorothyann Peng, MD;  Location: [facility];  Service: Endoscopy;  Laterality: N/A;  . Colonoscopy  2005 /2012    Dr. Lucianne Muss- diverticulosis,  ext hemorrhoids.  . Tonsillectomy  1930  . Appendectomy  1964  . Biopsy thyroid  11/2008  . Lung biopsy Left 06/06/2010   History   Social History  . Marital Status: Widowed    Spouse Name: N/A    Number of Children: 2  . Years of Education: college    Occupational History  . retired Tax Therapist, nutritional    Social History Main Topics  . Smoking status: Never Smoker   . Smokeless tobacco: Never Used     Comment: 2nd hand-husband  . Alcohol Use: No  . Drug Use: No  . Sexual Activity: Not Currently   Other Topics Concern  . None   Social History Narrative   2 adopted children   Was in [place], [place]   Family History  Problem Relation Age of Onset  . Heart failure Mother   . Osteoporosis Mother   . Hypertension Father   . Stroke Father   . Heart failure Father   . Leukemia Brother   . Heart failure Brother   . Cancer    . Cancer Maternal Grandmother   . Stroke Maternal Grandfather    Allergies  Allergen Reactions  . Ibuprofen Hives  . Naproxen   . Statins Other (See Comments)    Feels like knives in stomach  . Surgilube  [Gyne-Moistrin]     Rectal itching, burning  . Tape Rash   Prior to Admission medications   Medication Sig Start Date End Date Taking? Authorizing Provider  Artificial Tear Solution (TEARS NATURALE II OP) Apply 1 drop to eye daily as needed.   Yes Historical Provider, MD  aspirin 81 MG EC tablet Take 81 mg by mouth daily.     Yes Historical Provider, MD  atenolol (TENORMIN) 25 MG tablet Take 1/2 tablet daily for blood pressure 07/30/13  Yes Man X Mast, NP  Biotin 1000 MCG tablet Take 1,000 mcg by mouth every evening.    Yes Historical Provider, MD  brimonidine (ALPHAGAN) 0.2 % ophthalmic solution Place 1 drop into both eyes. Once a day   Yes Historical Provider, MD  Calcium Carbonate-Vitamin D (CALTRATE 600+D) 600-400 MG-UNIT per tablet Take 1 tablet by mouth daily.     Yes Historical Provider, MD  Docusate Calcium (STOOL SOFTENER PO) Take 1 capsule by mouth as needed.   Yes Historical Provider, MD  Fiber TABS Take 4 tablets by mouth daily.   Yes Historical Provider, MD  folic acid (FOLVITE) 657 MCG tablet Take 400 mcg by mouth daily.     Yes Historical Provider, MD  IRON PO Take 27 mg by mouth daily.   Yes Historical Provider, MD  latanoprost (XALATAN) 0.005 % ophthalmic solution Place 1 drop into both eyes daily.    Yes Historical Provider, MD  Multiple Vitamin (MULTIVITAMIN) capsule Take 1 capsule by mouth daily.    Yes Historical Provider, MD  niacin (SLO-NIACIN) 500 MG tablet Take 500 mg by mouth daily.     Yes Historical Provider, MD  omeprazole (PRILOSEC) 20 MG capsule Take 20 mg by mouth daily.     Yes Historical Provider, MD  Pseudoephedrine-DM-GG (SUDAFED COUGH PO) Take by mouth. Take one three times daily for post nasal drip   Yes Historical Provider, MD  vitamin B-12 (CYANOCOBALAMIN) 1000 MCG tablet Take 1,000 mcg by mouth daily.   Yes Historical Provider, MD  Zoledronic Acid (ZOMETA IV) Inject into the vein every 6 (six) months.   Yes Historical Provider, MD     ROS:  The patient  denies fevers, chills, night sweats, unintentional weight loss, chest pain, palpitations, wheezing, dyspnea on exertion, nausea, vomiting, abdominal pain, dysuria, hematuria, melena, numbness, weakness, or tingling.   All other systems have been reviewed and were otherwise negative with the exception of those mentioned in the HPI and as above.    PHYSICAL EXAM: Filed Vitals:   02/08/14 1502  BP: 112/70  Pulse: 80  Temp: 98.1 F (36.7 C)  Resp: 16   Filed Vitals:   02/08/14 1502  Height: 5' 3.5" (1.613 m)  Weight: 156 lb (70.761 kg)   Body mass index is 27.2 kg/(m^2).  General: Alert, no acute distress HEENT:  Normocephalic, atraumatic, oropharynx patent. EOMI, PERRLA Cardiovascular:  Regular rate and rhythm, no rubs murmurs or gallops.  No Carotid bruits, radial pulse intact. No pedal edema.  Respiratory: Clear to auscultation bilaterally.  No wheezes, rales, or rhonchi.  No cyanosis, no use of accessory musculature GI: No organomegaly, abdomen is soft and non-tender, positive bowel sounds.  No masses. Skin: No rashes. Neurologic: Facial musculature symmetric. Psychiatric: Patient is appropriate throughout our interaction. Lymphatic: No cervical lymphadenopathy Musculoskeletal: Gait intact. Right hand-full ROM, no deformities, 5/5 strength, sensation intact, + tenderness on palpation along lateral border of 5th digit , hypothenar region Right thumb base-mild crepitus, no obvious jt deformity, full ROM but paina dn tenderness on aplpation, sensation intact. Radial pulse intact.  Wrist normal, neg TInels, neg anterior drawer, neg phalens, unable to determine if +/- Dequervain.    LABS: Results for orders placed in visit on 02/03/14  CBC WITH DIFFERENTIAL      Result Value Ref Range   WBC 7.4  4.0 - 10.5 K/uL   RBC 3.83 (*) 3.87 - 5.11 MIL/uL   Hemoglobin 12.2  12.0 - 15.0 g/dL   HCT 36.8  36.0 - 46.0 %   MCV 96.1  78.0 - 100.0 fL   MCH 31.9  26.0 - 34.0 pg   MCHC 33.2  30.0  - 36.0 g/dL   RDW 13.5  11.5 - 15.5 %   Platelets 252  150 - 400 K/uL   Neutrophils Relative % 71  43 - 77 %   Neutro Abs 5.3  1.7 - 7.7 K/uL   Lymphocytes Relative 14  12 - 46 %   Lymphs Abs 1.1  0.7 - 4.0 K/uL   Monocytes Relative 12  3 - 12 %   Monocytes Absolute 0.9  0.1 - 1.0 K/uL   Eosinophils Relative 2  0 - 5 %   Eosinophils Absolute 0.2  0.0 - 0.7 K/uL   Basophils Relative 0  0 - 1 %   Basophils Absolute 0.0  0.0 - 0.1 K/uL  COMPREHENSIVE METABOLIC PANEL      Result Value Ref Range   Sodium 136 (*) 137 - 147 mEq/L   Potassium 4.4  3.7 - 5.3 mEq/L   Chloride 96  96 - 112 mEq/L   CO2 29  19 - 32 mEq/L   Glucose, Bld 91  70 - 99 mg/dL   BUN 16  6 - 23 mg/dL   Creatinine, Ser 1.16 (*) 0.50 - 1.10 mg/dL   Calcium 10.1  8.4 - 10.5 mg/dL   Total Protein 8.1  6.0 - 8.3 g/dL   Albumin 3.5  3.5 - 5.2 g/dL   AST 26  0 - 37 U/L   ALT 16  0 - 35 U/L   Alkaline Phosphatase 79  39 - 117 U/L  Total Bilirubin 0.3  0.3 - 1.2 mg/dL   GFR calc non Af Amer 40 (*) >90 mL/min   GFR calc Af Amer 47 (*) >90 mL/min  CANCER ANTIGEN 27.29      Result Value Ref Range   CA 27.29 49 (*) 0 - 39 U/mL  CEA      Result Value Ref Range   CEA 2.4  0.0 - 5.0 ng/mL  LACTATE DEHYDROGENASE      Result Value Ref Range   LDH 175  94 - 250 U/L     EKG/XRAY:   Primary read interpreted by Dr. Marin Comment at [facility]. No obvious acute  fracture but please comment if base of MCP , thumb is arthritic changes or acute changes   ASSESSMENT/PLAN: Encounter Diagnoses  Name Primary?  . Wrist pain, right   . Right hand pain   . Sprain and strain Yes  . Arthritis    Wrist splint with thumb spica, wear it while she is seated, do not use it while using manual walker, she is not stable using this while walking, she can use with electrical chair Unable to take NSAIDs Avoid repetitive motion specifically resting on lateral hand to move around with walker, would recommend that she use her electrical chair until she feel  better Tylenol prn F/u prn  Gross sideeffects, risk and benefits, and alternatives of medications d/w patient. Patient is aware that all medications have potential sideeffects and we are unable to predict every sideeffect or drug-drug interaction that may occur.  LE, Teachey, DO 02/08/2014 5:11 PM

## 2014-03-25 ENCOUNTER — Encounter: Payer: Self-pay | Admitting: Nurse Practitioner

## 2014-03-25 ENCOUNTER — Non-Acute Institutional Stay: Payer: Medicare Other | Admitting: Nurse Practitioner

## 2014-03-25 VITALS — BP 104/60 | HR 64 | Ht 63.5 in | Wt 158.0 lb

## 2014-03-25 DIAGNOSIS — K219 Gastro-esophageal reflux disease without esophagitis: Secondary | ICD-10-CM | POA: Diagnosis not present

## 2014-03-25 DIAGNOSIS — D649 Anemia, unspecified: Secondary | ICD-10-CM | POA: Diagnosis not present

## 2014-03-25 DIAGNOSIS — K5900 Constipation, unspecified: Secondary | ICD-10-CM

## 2014-03-25 DIAGNOSIS — I10 Essential (primary) hypertension: Secondary | ICD-10-CM | POA: Diagnosis not present

## 2014-03-25 DIAGNOSIS — M810 Age-related osteoporosis without current pathological fracture: Secondary | ICD-10-CM

## 2014-03-25 DIAGNOSIS — M1990 Unspecified osteoarthritis, unspecified site: Secondary | ICD-10-CM

## 2014-03-25 DIAGNOSIS — L989 Disorder of the skin and subcutaneous tissue, unspecified: Secondary | ICD-10-CM

## 2014-03-25 NOTE — Progress Notes (Signed)
Patient ID: [name], female   DOB: January 21, 1921, 80 y.o.   MRN: [id number]  Allergies  Allergen Reactions  . Ibuprofen Hives  . Naproxen   . Statins Other (See Comments)    Feels like knives in stomach  . Surgilube [Gyne-Moistrin]     Rectal itching, burning  . Tape Rash    Chief Complaint  Patient presents with  . Medical Managment of Chronic Issues    blood pressure, anemia    HPI: Patient is a 80 y.o. female seen in the clinic at [facility] today for for evaluation of he chronic medical conditions.  Problem List Items Addressed This Visit   ANEMIA-NOS     Resolved. Last Hgb 12.2 02/03/14. Taking Iron and folic acid and Vit U11, update CBC    GERD (Chronic)     Stable. Takes Omeprazole 41m daily.     HTN (hypertension) - Primary     Controlled. Takes Atenolol 12.542mdaily. ASA 81 and Niacin 50088maily for risk reduction. Update CMP    OSTEOARTHRITIS     Pain in her right forearm and thumb on and off since her fall 01/29/14 and strained her right arm. She wore brace for a while recommended by urgent care.     OSTEOPOROSIS     Will have bone density 07/2014(every 2 year. Has been treated)    Skin lesion of face     Left nare-Dermatology referral.     Unspecified constipation     Stable on Colace and fiber supplement.        Review of Systems:  Review of Systems  Constitutional: Negative for fever, chills, weight loss, malaise/fatigue and diaphoresis.  HENT: Positive for hearing loss. Negative for congestion, ear pain and sore throat.   Eyes: Negative for blurred vision, double vision, photophobia, pain, discharge and redness.  Respiratory: Negative for cough, hemoptysis, sputum production, shortness of breath, wheezing and stridor.   Cardiovascular: Negative for chest pain, palpitations, orthopnea, claudication and leg swelling.  Gastrointestinal: Negative for heartburn, nausea, vomiting, abdominal pain, diarrhea, constipation and blood in stool.   Genitourinary: Negative for dysuria, urgency, frequency, hematuria and flank pain.  Musculoskeletal: Positive for back pain and joint pain (resolved left 1st MTJ pain. ). Negative for falls, myalgias and neck pain.       Right forearm and thumb.   Skin: Negative for rash.       Concern of a skin lesion in the left nose region.   Neurological: Negative for dizziness, tingling, tremors, sensory change, speech change, focal weakness, seizures, loss of consciousness, weakness and headaches.  Endo/Heme/Allergies: Negative for environmental allergies and polydipsia. Does not bruise/bleed easily.  Psychiatric/Behavioral: Negative for depression, hallucinations and memory loss. The patient is not nervous/anxious and does not have insomnia.      Past Medical History  Diagnosis Date  . Lung cancer 05/2010    Left 2011 - rad x 3  . Rheumatic fever     Age [age] Pneumonia     RLL with sepsis  . Biceps tendon rupture     Bilateral  . Elevated liver enzymes     Biopsy consistent with steatohepatitis  . Right thyroid nodule   . Anxiety   . Lymphedema     Left arm  . Overactive bladder   . PSVT (paroxysmal supraventricular tachycardia)   . Osteoporosis, senile   . Osteoarthritis   . Low back pain     scoliosis  . Hyperlipidemia   .  GERD (gastroesophageal reflux disease) 06/2007    EGD Dr Gala Romney >sm HH, multiple fundic gland polyps  . Type 2 diabetes mellitus   . Anemia     NOS  . Allergic rhinitis   . Insomnia   . Diverticulosis   . Hemorrhoids   . Hiatal hernia   . Steatohepatitis     liver bx  . Fundic gland polyps of stomach, benign   . Hx of radiation therapy 07/17/10 to 07/21/10    SRS LLL lung  . Breast cancer 2001    Left s/p lumpectomy and XRT   . Depression   . Glaucoma 2013    both eyes  . Cough 2014  . Bronchiectasis 2014    Noted on chest x-ray   Past Surgical History  Procedure Laterality Date  . Cataract extraction Bilateral 2003    Lens implant Dr. Charise Killian  .  Cholecystectomy  1965  . Total hip arthroplasty Bilateral 2005 & 2007     Dr. Earl Lagos. Aline Brochure   . Abdominal hysterectomy  1964  . Breast lumpectomy  2001  . Cystoscopy  2007  . Breast biopsy      X 3 w/ cystectomy  . Revision total hip arthroplasty  2007    Left  . Liver biopsy  2008  . Colonoscopy  09/11/2011    Procedure: COLONOSCOPY;  Surgeon: Dorothyann Peng, MD;  [facility];  Service: Endoscopy;  Laterality: N/A;  . Esophagogastroduodenoscopy  09/11/2011    Procedure: ESOPHAGOGASTRODUODENOSCOPY (EGD);  Surgeon: Dorothyann Peng, MD;  Location: [facility];  Service: Endoscopy;  Laterality: N/A;  . Colonoscopy  2005 /2012    Dr. Lucianne Muss- diverticulosis, ext hemorrhoids.  . Tonsillectomy  1930  . Appendectomy  1964  . Biopsy thyroid  11/2008  . Lung biopsy Left 06/06/2010   Social History:   reports that she has never smoked. She has never used smokeless tobacco. She reports that she does not drink alcohol or use illicit drugs.  Family History  Problem Relation Age of Onset  . Heart failure Mother   . Osteoporosis Mother   . Hypertension Father   . Stroke Father   . Heart failure Father   . Leukemia Brother   . Heart failure Brother   . Cancer    . Cancer Maternal Grandmother   . Stroke Maternal Grandfather     Medications: Patient's Medications  New Prescriptions   No medications on file  Previous Medications   ARTIFICIAL TEAR SOLUTION (TEARS NATURALE II OP)    Apply 1 drop to eye daily as needed.   ASPIRIN 81 MG EC TABLET    Take 81 mg by mouth daily.     ATENOLOL (TENORMIN) 25 MG TABLET    Take 1/2 tablet daily for blood pressure   BIOTIN 1000 MCG TABLET    Take 1,000 mcg by mouth every evening.    BRIMONIDINE (ALPHAGAN) 0.2 % OPHTHALMIC SOLUTION    Place 1 drop into both eyes. Once a day   CALCIUM CARBONATE-VITAMIN D (CALTRATE 600+D) 600-400 MG-UNIT PER TABLET    Take 1 tablet by mouth daily.     DOCUSATE CALCIUM (STOOL SOFTENER PO)    Take 1  capsule by mouth as needed.   FIBER TABS    Take 4 tablets by mouth daily.   FOLIC ACID (FOLVITE) 329 MCG TABLET    Take 400 mcg by mouth daily.     IRON PO    Take 27 mg by  mouth daily.   LATANOPROST (XALATAN) 0.005 % OPHTHALMIC SOLUTION    Place 1 drop into both eyes daily.    MULTIPLE VITAMIN (MULTIVITAMIN) CAPSULE    Take 1 capsule by mouth daily.    NIACIN (SLO-NIACIN) 500 MG TABLET    Take 500 mg by mouth daily.     OMEPRAZOLE (PRILOSEC) 20 MG CAPSULE    Take 20 mg by mouth daily.     PSEUDOEPHEDRINE-DM-GG (SUDAFED COUGH PO)    Take by mouth. Take one three times daily for post nasal drip   VITAMIN B-12 (CYANOCOBALAMIN) 1000 MCG TABLET    Take 1,000 mcg by mouth daily.   ZOLEDRONIC ACID (ZOMETA IV)    Inject into the vein every 6 (six) months.  Modified Medications   No medications on file  Discontinued Medications   No medications on file     Physical Exam: Physical Exam  Constitutional: She is oriented to person, place, and time. She appears well-developed and well-nourished. No distress.  HENT:  Right Ear: External ear normal.  Left Ear: External ear normal.  Nose: Nose normal.  Mouth/Throat: Oropharynx is clear and moist. No oropharyngeal exudate.  Eyes: Conjunctivae and EOM are normal. Pupils are equal, round, and reactive to light.  Neck: Normal range of motion. Neck supple. No JVD present. No tracheal deviation present. No thyromegaly present.  Cardiovascular: Normal rate, regular rhythm and normal heart sounds.  Exam reveals no gallop and no friction rub.   No murmur heard. Pulmonary/Chest: Effort normal. No respiratory distress. She has no wheezes. She has rales. She exhibits no tenderness.  Abdominal: Soft. Bowel sounds are normal. She exhibits no distension and no mass. There is no tenderness. There is no rebound.  Musculoskeletal: Normal range of motion. She exhibits no edema and no tenderness.  Lymphadenopathy:    She has no cervical adenopathy.  Neurological:  She is alert and oriented to person, place, and time. She has normal reflexes. No cranial nerve deficit.  Skin: Skin is warm and dry. No rash noted. No erythema. No pallor.  Psychiatric: She has a normal mood and affect. Her behavior is normal. Judgment and thought content normal.    Filed Vitals:   03/25/14 1347  BP: 104/60  Pulse: 64  Height: 5' 3.5" (1.613 m)  Weight: 158 lb (71.668 kg)    Labs reviewed: Basic Metabolic Panel:  Recent Labs  07/23/13 02/03/14 0845  NA 137 136*  K 4.8 4.4  CL  --  96  CO2  --  29  GLUCOSE  --  91  BUN 22* 16  CREATININE 1.1 1.16*  CALCIUM  --  10.1   Liver Function Tests:  Recent Labs  07/23/13 02/03/14 0845  AST 28 26  ALT 17 16  ALKPHOS 71 79  BILITOT  --  0.3  PROT  --  8.1  ALBUMIN  --  3.5    Past Procedures:  01/27/13 CT chest:   IMPRESSION:  Chronic parenchymal lung changes likely related to prior radiation  therapy and scarring.  No evidence of recurrent or metastatic disease.  Assessment/Plan HTN (hypertension) Controlled. Takes Atenolol 12.40m daily. ASA 81 and Niacin 5093mdaily for risk reduction. Update CMP  ANEMIA-NOS Resolved. Last Hgb 12.2 02/03/14. Taking Iron and folic acid and Vit B1X51update CBC  GERD Stable. Takes Omeprazole 2045maily.   Unspecified constipation Stable on Colace and fiber supplement.   OSTEOARTHRITIS Pain in her right forearm and thumb on and off since her fall 01/29/14  and strained her right arm. She wore brace for a while recommended by urgent care.   Skin lesion of face Left nare-Dermatology referral.   OSTEOPOROSIS Will have bone density 07/2014(every 2 year. Has been treated)    Family/ Staff Communication: observe the patient.   Goals of Care: [place]  Labs/tests ordered: Bone Density 07/2014. Dermatology referral. CBC. CMP

## 2014-03-26 ENCOUNTER — Telehealth: Payer: Self-pay

## 2014-03-26 NOTE — Telephone Encounter (Signed)
Called patient with appointments: DEXA at [facility] 08/25/14 at 1:00 (order faxed). Dr. Harriett Sine 06/17/14 at 11:50. Patient wrote date and time down.

## 2014-03-27 DIAGNOSIS — K5900 Constipation, unspecified: Secondary | ICD-10-CM | POA: Insufficient documentation

## 2014-03-27 DIAGNOSIS — K5901 Slow transit constipation: Secondary | ICD-10-CM | POA: Insufficient documentation

## 2014-03-27 DIAGNOSIS — I10 Essential (primary) hypertension: Secondary | ICD-10-CM | POA: Insufficient documentation

## 2014-03-27 DIAGNOSIS — L989 Disorder of the skin and subcutaneous tissue, unspecified: Secondary | ICD-10-CM | POA: Insufficient documentation

## 2014-03-27 NOTE — Assessment & Plan Note (Signed)
Stable. Takes Omeprazole 87m daily.

## 2014-03-27 NOTE — Assessment & Plan Note (Signed)
Will have bone density 07/2014(every 2 year. Has been treated)

## 2014-03-27 NOTE — Assessment & Plan Note (Signed)
Stable on Colace and fiber supplement.

## 2014-03-27 NOTE — Assessment & Plan Note (Signed)
Pain in her right forearm and thumb on and off since her fall 01/29/14 and strained her right arm. She wore brace for a while recommended by urgent care.

## 2014-03-27 NOTE — Assessment & Plan Note (Signed)
Left nare-Dermatology referral.

## 2014-03-27 NOTE — Assessment & Plan Note (Addendum)
Controlled. Takes Atenolol 12.31m daily. ASA 81 and Niacin 5029mdaily for risk reduction. Update CMP

## 2014-03-27 NOTE — Assessment & Plan Note (Addendum)
Resolved. Last Hgb 12.2 02/03/14. Taking Iron and folic acid and Vit J34, update CBC

## 2014-04-08 ENCOUNTER — Non-Acute Institutional Stay: Payer: Medicare Other | Admitting: Nurse Practitioner

## 2014-04-08 ENCOUNTER — Encounter: Payer: Self-pay | Admitting: Nurse Practitioner

## 2014-04-08 VITALS — BP 110/62 | HR 60 | Wt 158.0 lb

## 2014-04-08 DIAGNOSIS — K219 Gastro-esophageal reflux disease without esophagitis: Secondary | ICD-10-CM

## 2014-04-08 DIAGNOSIS — K5900 Constipation, unspecified: Secondary | ICD-10-CM | POA: Diagnosis not present

## 2014-04-08 DIAGNOSIS — H9190 Unspecified hearing loss, unspecified ear: Secondary | ICD-10-CM | POA: Diagnosis not present

## 2014-04-08 DIAGNOSIS — I10 Essential (primary) hypertension: Secondary | ICD-10-CM

## 2014-04-08 DIAGNOSIS — H9193 Unspecified hearing loss, bilateral: Secondary | ICD-10-CM | POA: Insufficient documentation

## 2014-04-08 NOTE — Assessment & Plan Note (Signed)
Controlled. Takes Atenolol 12.62m daily. ASA 81 and Niacin 5020mdaily for risk reduction.

## 2014-04-08 NOTE — Progress Notes (Signed)
Patient ID: [name], female   DOB: 1920-12-23, 80 y.o.   MRN: [id number]     Allergies  Allergen Reactions  . Ibuprofen Hives  . Naproxen   . Statins Other (See Comments)    Feels like knives in stomach  . Surgilube [Gyne-Moistrin]     Rectal itching, burning  . Tape Rash    Chief Complaint  Patient presents with  . Hearing Loss    Notice more hearing lost. Needs referral for [facility] has appt 04/27/14, to check hearing and to elevate both hearing aids    HPI: Patient is a 80 y.o. female seen in the clinic at [facility] today for for evaluation of HOH and chronic medical conditions.  Problem List Items Addressed This Visit   GERD (Chronic)     Stable. Takes Omeprazole 38m daily.     Hearing loss of both ears - Primary     Both ears. Wearing hearing aids for years. Only hears when speaker adjust tone of voice in a quite setting. Will refer to Audiology for further evaluation.     HTN (hypertension)     Controlled. Takes Atenolol 12.550mdaily. ASA 81 and Niacin 50023maily for risk reduction.    Unspecified constipation     Stable on Colace and fiber supplement.         Review of Systems:  Review of Systems  Constitutional: Negative for fever, chills, weight loss, malaise/fatigue and diaphoresis.  HENT: Positive for hearing loss. Negative for congestion, ear pain and sore throat.   Eyes: Negative for blurred vision, double vision, photophobia, pain, discharge and redness.  Respiratory: Negative for cough, hemoptysis, sputum production, shortness of breath, wheezing and stridor.   Cardiovascular: Negative for chest pain, palpitations, orthopnea, claudication and leg swelling.  Gastrointestinal: Negative for heartburn, nausea, vomiting, abdominal pain, diarrhea, constipation and blood in stool.  Genitourinary: Negative for dysuria, urgency, frequency, hematuria and flank pain.  Musculoskeletal: Positive for back pain. Negative for falls, joint pain  (resolved left 1st MTJ pain. ), myalgias and neck pain.  Skin: Negative for rash.       Concern of a skin lesion in the left nose region.   Neurological: Negative for dizziness, tingling, tremors, sensory change, speech change, focal weakness, seizures, loss of consciousness, weakness and headaches.  Endo/Heme/Allergies: Negative for environmental allergies and polydipsia. Does not bruise/bleed easily.  Psychiatric/Behavioral: Negative for depression, hallucinations and memory loss. The patient is not nervous/anxious and does not have insomnia.      Past Medical History  Diagnosis Date  . Lung cancer 05/2010    Left 2011 - rad x 3  . Rheumatic fever     Age [age] Pneumonia     RLL with sepsis  . Biceps tendon rupture     Bilateral  . Elevated liver enzymes     Biopsy consistent with steatohepatitis  . Right thyroid nodule   . Anxiety   . Lymphedema     Left arm  . Overactive bladder   . PSVT (paroxysmal supraventricular tachycardia)   . Osteoporosis, senile   . Osteoarthritis   . Low back pain     scoliosis  . Hyperlipidemia   . GERD (gastroesophageal reflux disease) 06/2007    EGD Dr RouGala Romne[facility], multiple fundic gland polyps  . Type 2 diabetes mellitus   . Anemia     NOS  . Allergic rhinitis   . Insomnia   . Diverticulosis   . Hemorrhoids   .  Hiatal hernia   . Steatohepatitis     liver bx  . Fundic gland polyps of stomach, benign   . Hx of radiation therapy 07/17/10 to 07/21/10    SRS LLL lung  . Breast cancer 2001    Left s/p lumpectomy and XRT   . Depression   . Glaucoma 2013    both eyes  . Cough 2014  . Bronchiectasis 2014    Noted on chest x-ray   Past Surgical History  Procedure Laterality Date  . Cataract extraction Bilateral 2003    Lens implant Dr. Charise Killian  . Cholecystectomy  1965  . Total hip arthroplasty Bilateral 2005 & 2007     Dr. Earl Lagos. Aline Brochure   . Abdominal hysterectomy  1964  . Breast lumpectomy  2001  . Cystoscopy  2007  . Breast  biopsy      X 3 w/ cystectomy  . Revision total hip arthroplasty  2007    Left  . Liver biopsy  2008  . Colonoscopy  09/11/2011    Procedure: COLONOSCOPY;  Surgeon: Dorothyann Peng, MD;  Location: [facility];  Service: Endoscopy;  Laterality: N/A;  . Esophagogastroduodenoscopy  09/11/2011    Procedure: ESOPHAGOGASTRODUODENOSCOPY (EGD);  Surgeon: Dorothyann Peng, MD;  Location: [facility];  Service: Endoscopy;  Laterality: N/A;  . Colonoscopy  2005 /2012    Dr. Lucianne Muss- diverticulosis, ext hemorrhoids.  . Tonsillectomy  1930  . Appendectomy  1964  . Biopsy thyroid  11/2008  . Lung biopsy Left 06/06/2010   Social History:   reports that she has never smoked. She has never used smokeless tobacco. She reports that she does not drink alcohol or use illicit drugs.  Family History  Problem Relation Age of Onset  . Heart failure Mother   . Osteoporosis Mother   . Hypertension Father   . Stroke Father   . Heart failure Father   . Leukemia Brother   . Heart failure Brother   . Cancer    . Cancer Maternal Grandmother   . Stroke Maternal Grandfather     Medications: Patient's Medications  New Prescriptions   No medications on file  Previous Medications   ARTIFICIAL TEAR SOLUTION (TEARS NATURALE II OP)    Apply 1 drop to eye daily as needed.   ASPIRIN 81 MG EC TABLET    Take 81 mg by mouth daily.     ATENOLOL (TENORMIN) 25 MG TABLET    Take 1/2 tablet daily for blood pressure   BIOTIN 1000 MCG TABLET    Take 1,000 mcg by mouth every evening.    BRIMONIDINE (ALPHAGAN) 0.2 % OPHTHALMIC SOLUTION    Place 1 drop into both eyes. Once a day   CALCIUM CARBONATE-VITAMIN D (CALTRATE 600+D) 600-400 MG-UNIT PER TABLET    Take 1 tablet by mouth daily.     DOCUSATE CALCIUM (STOOL SOFTENER PO)    Take 1 capsule by mouth as needed.   FIBER TABS    Take 4 tablets by mouth daily.   FOLIC ACID (FOLVITE) 161 MCG TABLET    Take 400 mcg by mouth daily.     IRON PO    Take 27 mg by mouth daily.    LATANOPROST (XALATAN) 0.005 % OPHTHALMIC SOLUTION    Place 1 drop into both eyes daily.    MULTIPLE VITAMIN (MULTIVITAMIN) CAPSULE    Take 1 capsule by mouth daily.    NIACIN (SLO-NIACIN) 500 MG TABLET    Take 500 mg by  mouth daily.     OMEPRAZOLE (PRILOSEC) 20 MG CAPSULE    Take 20 mg by mouth daily.     PSEUDOEPHEDRINE-DM-GG (SUDAFED COUGH PO)    Take by mouth. Take one three times daily for post nasal drip   VITAMIN B-12 (CYANOCOBALAMIN) 1000 MCG TABLET    Take 1,000 mcg by mouth daily.   ZOLEDRONIC ACID (ZOMETA IV)    Inject into the vein every 6 (six) months.  Modified Medications   No medications on file  Discontinued Medications   No medications on file     Physical Exam: Physical Exam  Constitutional: She is oriented to person, place, and time. She appears well-developed and well-nourished. No distress.  HENT:  Right Ear: External ear normal. No lacerations. No drainage, swelling or tenderness. No foreign bodies. No mastoid tenderness. Tympanic membrane is not injected, not scarred, not perforated, not erythematous, not retracted and not bulging. Tympanic membrane mobility is normal. No middle ear effusion. No hemotympanum. Decreased hearing is noted.  Left Ear: External ear normal. No lacerations. No drainage, swelling or tenderness. No foreign bodies. No mastoid tenderness. Tympanic membrane is not injected, not scarred, not perforated, not erythematous, not retracted and not bulging. Tympanic membrane mobility is normal.  No middle ear effusion. No hemotympanum. Decreased hearing is noted.  Nose: Nose normal.  Mouth/Throat: Oropharynx is clear and moist. No oropharyngeal exudate.  Mild irritated external era canal.   Eyes: Conjunctivae and EOM are normal. Pupils are equal, round, and reactive to light.  Neck: Normal range of motion. Neck supple. No JVD present. No tracheal deviation present. No thyromegaly present.  Cardiovascular: Normal rate, regular rhythm and normal heart  sounds.  Exam reveals no gallop and no friction rub.   No murmur heard. Pulmonary/Chest: Effort normal. No respiratory distress. She has no wheezes. She has rales. She exhibits no tenderness.  Abdominal: Soft. Bowel sounds are normal. She exhibits no distension and no mass. There is no tenderness. There is no rebound.  Musculoskeletal: Normal range of motion. She exhibits no edema and no tenderness.  Lymphadenopathy:    She has no cervical adenopathy.  Neurological: She is alert and oriented to person, place, and time. She has normal reflexes. No cranial nerve deficit.  Skin: Skin is warm and dry. No rash noted. No erythema. No pallor.  Psychiatric: She has a normal mood and affect. Her behavior is normal. Judgment and thought content normal.    Filed Vitals:   04/08/14 1459  BP: 110/62  Pulse: 60  Weight: 158 lb (71.668 kg)    Labs reviewed: Basic Metabolic Panel:  Recent Labs  07/23/13 02/03/14 0845  NA 137 136*  K 4.8 4.4  CL  --  96  CO2  --  29  GLUCOSE  --  91  BUN 22* 16  CREATININE 1.1 1.16*  CALCIUM  --  10.1   Liver Function Tests:  Recent Labs  07/23/13 02/03/14 0845  AST 28 26  ALT 17 16  ALKPHOS 71 79  BILITOT  --  0.3  PROT  --  8.1  ALBUMIN  --  3.5    Past Procedures:  01/27/13 CT chest:   IMPRESSION:  Chronic parenchymal lung changes likely related to prior radiation  therapy and scarring.  No evidence of recurrent or metastatic disease.  Assessment/Plan Hearing loss of both ears Both ears. Wearing hearing aids for years. Only hears when speaker adjust tone of voice in a quite setting. Will refer to Audiology for further  evaluation.   HTN (hypertension) Controlled. Takes Atenolol 12.50m daily. ASA 81 and Niacin 5092mdaily for risk reduction.  Unspecified constipation Stable on Colace and fiber supplement.    GERD Stable. Takes Omeprazole 2052maily.     Family/ Staff Communication: observe the patient. Audiology referral.    Goals of Care: [place]  Labs/tests ordered: none

## 2014-04-08 NOTE — Assessment & Plan Note (Signed)
Both ears. Wearing hearing aids for years. Only hears when speaker adjust tone of voice in a quite setting. Will refer to Audiology for further evaluation.

## 2014-04-08 NOTE — Assessment & Plan Note (Signed)
Stable on Colace and fiber supplement.

## 2014-04-08 NOTE — Assessment & Plan Note (Signed)
Stable. Takes Omeprazole 47m daily.

## 2014-04-27 DIAGNOSIS — H903 Sensorineural hearing loss, bilateral: Secondary | ICD-10-CM | POA: Diagnosis not present

## 2014-05-26 ENCOUNTER — Encounter: Payer: Self-pay | Admitting: Internal Medicine

## 2014-06-17 ENCOUNTER — Other Ambulatory Visit: Payer: Self-pay | Admitting: Dermatology

## 2014-06-17 DIAGNOSIS — C44319 Basal cell carcinoma of skin of other parts of face: Secondary | ICD-10-CM | POA: Diagnosis not present

## 2014-07-15 ENCOUNTER — Encounter: Payer: Self-pay | Admitting: Nurse Practitioner

## 2014-07-15 ENCOUNTER — Non-Acute Institutional Stay: Payer: Medicare Other | Admitting: Nurse Practitioner

## 2014-07-15 VITALS — BP 138/70 | HR 73 | Temp 98.0°F | Resp 18 | Ht 63.5 in | Wt 157.2 lb

## 2014-07-15 DIAGNOSIS — I10 Essential (primary) hypertension: Secondary | ICD-10-CM | POA: Diagnosis not present

## 2014-07-15 DIAGNOSIS — K5900 Constipation, unspecified: Secondary | ICD-10-CM

## 2014-07-15 DIAGNOSIS — K219 Gastro-esophageal reflux disease without esophagitis: Secondary | ICD-10-CM | POA: Diagnosis not present

## 2014-07-15 DIAGNOSIS — M6281 Muscle weakness (generalized): Secondary | ICD-10-CM

## 2014-07-15 DIAGNOSIS — R1033 Periumbilical pain: Secondary | ICD-10-CM

## 2014-07-15 NOTE — Assessment & Plan Note (Signed)
Stable, takes Colace and fiber supplement.

## 2014-07-15 NOTE — Assessment & Plan Note (Signed)
Controlled. Takes Atenolol 12.89m daily. ASA 81 and Niacin 5038mdaily for risk reduction.

## 2014-07-15 NOTE — Assessment & Plan Note (Signed)
Stable. Takes Omeprazole 61m daily.

## 2014-07-15 NOTE — Assessment & Plan Note (Addendum)
x1 about 2 1/2 weeks ago while out of town with her son. abd pain resolved after passing a large loose stool. No recurrence since then. The patient worries if her ovary has developed cysts. Last Colonoscopy was about 3 years ago due to her GI bleed-no further GI bleed since her Naproxen was discontinued. May refer to GI if no better.

## 2014-07-15 NOTE — Progress Notes (Signed)
Patient ID: [name], female   DOB: 07-Nov-1921, 80 y.o.   MRN: [id number]     Allergies  Allergen Reactions  . Ibuprofen Hives  . Naproxen   . Statins Other (See Comments)    Feels like knives in stomach  . Surgilube [Gyne-Moistrin]     Rectal itching, burning  . Tape Rash    Chief Complaint  Patient presents with  . Medical Management of Chronic Issues    cramping  2.5 wks ago, abdominal    HPI: Patient is a 80 y.o. female seen in the clinic at [facility] today for for evaluation of one isolated episode of abd pain which was resolved after a large loose BM and chronic medical conditions.  Problem List Items Addressed This Visit   GERD (Chronic)     Stable. Takes Omeprazole 64m daily.     WEAKNESS, MUSCLE     Uses walker to ambulate for short distance and power scooter to go further.     HTN (hypertension)     Controlled. Takes Atenolol 12.564mdaily. ASA 81 and Niacin 50011maily for risk reduction.     Unspecified constipation     Stable, takes Colace and fiber supplement.       Abdominal cramping, periumbilical - Primary     x1 about 2 1/2 weeks ago while out of town with her son. abd pain resolved after passing a large loose stool. No recurrence since then. The patient worries if her ovary has developed cysts. Last Colonoscopy was about 3 years ago due to her GI bleed-no further GI bleed since her Naproxen was discontinued. May refer to GI if no better.        Review of Systems:  Review of Systems  Constitutional: Negative for fever, chills, weight loss, malaise/fatigue and diaphoresis.  HENT: Positive for hearing loss. Negative for congestion, ear pain and sore throat.   Eyes: Negative for blurred vision, double vision, photophobia, pain, discharge and redness.  Respiratory: Negative for cough, hemoptysis, sputum production, shortness of breath, wheezing and stridor.   Cardiovascular: Negative for chest pain, palpitations, orthopnea, claudication  and leg swelling.  Gastrointestinal: Positive for abdominal pain and diarrhea. Negative for heartburn, nausea, vomiting, constipation and blood in stool.       X1 isolated episode while out of town with her son. abd cramp was resolved after a large loose stool.   Genitourinary: Negative for dysuria, urgency, frequency, hematuria and flank pain.  Musculoskeletal: Positive for back pain. Negative for falls, joint pain (resolved left 1st MTJ pain. ), myalgias and neck pain.  Skin: Negative for rash.       Concern of a skin lesion in the left nose region.   Neurological: Negative for dizziness, tingling, tremors, sensory change, speech change, focal weakness, seizures, loss of consciousness, weakness and headaches.  Endo/Heme/Allergies: Negative for environmental allergies and polydipsia. Does not bruise/bleed easily.  Psychiatric/Behavioral: Negative for depression, hallucinations and memory loss. The patient is not nervous/anxious and does not have insomnia.      Past Medical History  Diagnosis Date  . Lung cancer 05/2010    Left 2011 - rad x 3  . Rheumatic fever     Age [age] Pneumonia     RLL with sepsis  . Biceps tendon rupture     Bilateral  . Elevated liver enzymes     Biopsy consistent with steatohepatitis  . Right thyroid nodule   . Anxiety   . Lymphedema  Left arm  . Overactive bladder   . PSVT (paroxysmal supraventricular tachycardia)   . Osteoporosis, senile   . Osteoarthritis   . Low back pain     scoliosis  . Hyperlipidemia   . GERD (gastroesophageal reflux disease) 06/2007    EGD Dr Gala Romney >[facility], multiple fundic gland polyps  . Type 2 diabetes mellitus   . Anemia     NOS  . Allergic rhinitis   . Insomnia   . Diverticulosis   . Hemorrhoids   . Hiatal hernia   . Steatohepatitis     liver bx  . Fundic gland polyps of stomach, benign   . Hx of radiation therapy 07/17/10 to 07/21/10    SRS LLL lung  . Breast cancer 2001    Left s/p lumpectomy and XRT   .  Depression   . Glaucoma 2013    both eyes  . Cough 2014  . Bronchiectasis 2014    Noted on chest x-ray   Past Surgical History  Procedure Laterality Date  . Cataract extraction Bilateral 2003    Lens implant Dr. Charise Killian  . Cholecystectomy  1965  . Total hip arthroplasty Bilateral 2005 & 2007     Dr. Earl Lagos. Aline Brochure   . Abdominal hysterectomy  1964  . Breast lumpectomy  2001  . Cystoscopy  2007  . Breast biopsy      X 3 w/ cystectomy  . Revision total hip arthroplasty  2007    Left  . Liver biopsy  2008  . Colonoscopy  09/11/2011    Procedure: COLONOSCOPY;  Surgeon: Dorothyann Peng, MD;  Location: [facility];  Service: Endoscopy;  Laterality: N/A;  . Esophagogastroduodenoscopy  09/11/2011    Procedure: ESOPHAGOGASTRODUODENOSCOPY (EGD);  Surgeon: Dorothyann Peng, MD;  Location: [facility];  Service: Endoscopy;  Laterality: N/A;  . Colonoscopy  2005 /2012    Dr. Lucianne Muss- diverticulosis, ext hemorrhoids.  . Tonsillectomy  1930  . Appendectomy  1964  . Biopsy thyroid  11/2008  . Lung biopsy Left 06/06/2010   Social History:   reports that she has never smoked. She has never used smokeless tobacco. She reports that she does not drink alcohol or use illicit drugs.  Family History  Problem Relation Age of Onset  . Heart failure Mother   . Osteoporosis Mother   . Hypertension Father   . Stroke Father   . Heart failure Father   . Leukemia Brother   . Heart failure Brother   . Cancer    . Cancer Maternal Grandmother   . Stroke Maternal Grandfather     Medications: Patient's Medications  New Prescriptions   No medications on file  Previous Medications   ARTIFICIAL TEAR SOLUTION (TEARS NATURALE II OP)    Apply 1 drop to eye daily as needed.   ASPIRIN 81 MG EC TABLET    Take 81 mg by mouth daily.     ATENOLOL (TENORMIN) 25 MG TABLET    Take 1/2 tablet daily for blood pressure   BIOTIN 1000 MCG TABLET    Take 1,000 mcg by mouth every evening.    BRIMONIDINE (ALPHAGAN)  0.2 % OPHTHALMIC SOLUTION    Place 1 drop into both eyes. Once a day   CALCIUM CARBONATE-VITAMIN D (CALTRATE 600+D) 600-400 MG-UNIT PER TABLET    Take 1 tablet by mouth daily.     DOCUSATE CALCIUM (STOOL SOFTENER PO)    Take 1 capsule by mouth as needed.   FIBER TABS  Take 4 tablets by mouth daily.   FOLIC ACID (FOLVITE) 856 MCG TABLET    Take 400 mcg by mouth daily.     IRON PO    Take 27 mg by mouth daily.   LATANOPROST (XALATAN) 0.005 % OPHTHALMIC SOLUTION    Place 1 drop into both eyes daily.    MULTIPLE VITAMIN (MULTIVITAMIN) CAPSULE    Take 1 capsule by mouth daily.    NIACIN (SLO-NIACIN) 500 MG TABLET    Take 500 mg by mouth daily.     OMEPRAZOLE (PRILOSEC) 20 MG CAPSULE    Take 20 mg by mouth daily.     PSEUDOEPHEDRINE-DM-GG (SUDAFED COUGH PO)    Take 1 tablet by mouth as needed. For post nasal drip   VITAMIN B-12 (CYANOCOBALAMIN) 1000 MCG TABLET    Take 1,000 mcg by mouth daily.  Modified Medications   No medications on file  Discontinued Medications   ZOLEDRONIC ACID (ZOMETA IV)    Inject into the vein every 6 (six) months.     Physical Exam: Physical Exam  Constitutional: She is oriented to person, place, and time. She appears well-developed and well-nourished. No distress.  HENT:  Right Ear: External ear normal. No lacerations. No drainage, swelling or tenderness. No foreign bodies. No mastoid tenderness. Tympanic membrane is not injected, not scarred, not perforated, not erythematous, not retracted and not bulging. Tympanic membrane mobility is normal. No middle ear effusion. No hemotympanum. Decreased hearing is noted.  Left Ear: External ear normal. No lacerations. No drainage, swelling or tenderness. No foreign bodies. No mastoid tenderness. Tympanic membrane is not injected, not scarred, not perforated, not erythematous, not retracted and not bulging. Tympanic membrane mobility is normal.  No middle ear effusion. No hemotympanum. Decreased hearing is noted.  Nose: Nose  normal.  Mouth/Throat: Oropharynx is clear and moist. No oropharyngeal exudate.  Mild irritated external era canal.   Eyes: Conjunctivae and EOM are normal. Pupils are equal, round, and reactive to light.  Neck: Normal range of motion. Neck supple. No JVD present. No tracheal deviation present. No thyromegaly present.  Cardiovascular: Normal rate, regular rhythm and normal heart sounds.  Exam reveals no gallop and no friction rub.   No murmur heard. Pulmonary/Chest: Effort normal. No respiratory distress. She has no wheezes. She has rales. She exhibits no tenderness.  Abdominal: Soft. Bowel sounds are normal. She exhibits no distension and no mass. There is no tenderness. There is no rebound.  Musculoskeletal: Normal range of motion. She exhibits no edema and no tenderness.  Lymphadenopathy:    She has no cervical adenopathy.  Neurological: She is alert and oriented to person, place, and time. She has normal reflexes. No cranial nerve deficit.  Skin: Skin is warm and dry. No rash noted. No erythema. No pallor.  Psychiatric: She has a normal mood and affect. Her behavior is normal. Judgment and thought content normal.    Filed Vitals:   07/15/14 1346  BP: 138/70  Pulse: 73  Temp: 98 F (36.7 C)  TempSrc: Oral  Resp: 18  Height: 5' 3.5" (1.613 m)  Weight: 157 lb 3.2 oz (71.305 kg)  SpO2: 97%    Labs reviewed: Basic Metabolic Panel:  Recent Labs  07/23/13 02/03/14 0845  NA 137 136*  K 4.8 4.4  CL  --  96  CO2  --  29  GLUCOSE  --  91  BUN 22* 16  CREATININE 1.1 1.16*  CALCIUM  --  10.1   Liver Function Tests:  Recent Labs  07/23/13 02/03/14 0845  AST 28 26  ALT 17 16  ALKPHOS 71 79  BILITOT  --  0.3  PROT  --  8.1  ALBUMIN  --  3.5    Past Procedures:  01/27/13 CT chest:   IMPRESSION:  Chronic parenchymal lung changes likely related to prior radiation  therapy and scarring.  No evidence of recurrent or metastatic disease.  Assessment/Plan Abdominal  cramping, periumbilical x1 about 2 1/2 weeks ago while out of town with her son. abd pain resolved after passing a large loose stool. No recurrence since then. The patient worries if her ovary has developed cysts. Last Colonoscopy was about 3 years ago due to her GI bleed-no further GI bleed since her Naproxen was discontinued. May refer to GI if no better.   Unspecified constipation Stable, takes Colace and fiber supplement.     GERD Stable. Takes Omeprazole 15m daily.   HTN (hypertension) Controlled. Takes Atenolol 12.567mdaily. ASA 81 and Niacin 50037maily for risk reduction.   WEAKNESS, MUSCLE Uses walker to ambulate for short distance and power scooter to go further.     Family/ Staff Communication: observe the patient.   Goals of Care: [place]  Labs/tests ordered: none

## 2014-07-15 NOTE — Assessment & Plan Note (Signed)
Uses walker to ambulate for short distance and power scooter to go further.

## 2014-07-20 NOTE — Addendum Note (Signed)
Addended by: Noe Pittsley X on: 07/20/2014 01:01 PM   Modules accepted: Level of Service

## 2014-08-12 DIAGNOSIS — Z85828 Personal history of other malignant neoplasm of skin: Secondary | ICD-10-CM | POA: Diagnosis not present

## 2014-08-12 DIAGNOSIS — C44319 Basal cell carcinoma of skin of other parts of face: Secondary | ICD-10-CM | POA: Diagnosis not present

## 2014-08-25 DIAGNOSIS — Z853 Personal history of malignant neoplasm of breast: Secondary | ICD-10-CM | POA: Diagnosis not present

## 2014-08-25 DIAGNOSIS — Z85118 Personal history of other malignant neoplasm of bronchus and lung: Secondary | ICD-10-CM | POA: Diagnosis not present

## 2014-08-25 LAB — HM DEXA SCAN

## 2014-09-01 ENCOUNTER — Encounter: Payer: Self-pay | Admitting: *Deleted

## 2014-09-16 DIAGNOSIS — R748 Abnormal levels of other serum enzymes: Secondary | ICD-10-CM | POA: Diagnosis not present

## 2014-09-16 DIAGNOSIS — D649 Anemia, unspecified: Secondary | ICD-10-CM | POA: Diagnosis not present

## 2014-09-16 LAB — BASIC METABOLIC PANEL
BUN: 17 mg/dL (ref 4–21)
Creatinine: 0.7 mg/dL (ref 0.5–1.1)
GLUCOSE: 80 mg/dL
Potassium: 4.2 mmol/L (ref 3.4–5.3)
Sodium: 140 mmol/L (ref 137–147)

## 2014-09-16 LAB — HEPATIC FUNCTION PANEL
ALT: 24 U/L (ref 7–35)
AST: 33 U/L (ref 13–35)
Alkaline Phosphatase: 70 U/L (ref 25–125)
BILIRUBIN, TOTAL: 0.3 mg/dL

## 2014-09-16 LAB — CBC AND DIFFERENTIAL
HCT: 36 % (ref 36–46)
Hemoglobin: 11.3 g/dL — AB (ref 12.0–16.0)
PLATELETS: 211 10*3/uL (ref 150–399)
WBC: 6.8 10*3/mL

## 2014-09-23 ENCOUNTER — Encounter: Payer: Self-pay | Admitting: Nurse Practitioner

## 2014-10-01 ENCOUNTER — Other Ambulatory Visit: Payer: Self-pay

## 2014-10-04 DIAGNOSIS — Z23 Encounter for immunization: Secondary | ICD-10-CM | POA: Diagnosis not present

## 2014-10-21 ENCOUNTER — Non-Acute Institutional Stay: Payer: Medicare Other | Admitting: Nurse Practitioner

## 2014-10-21 ENCOUNTER — Encounter: Payer: Self-pay | Admitting: Nurse Practitioner

## 2014-10-21 VITALS — BP 120/62 | HR 72 | Wt 158.0 lb

## 2014-10-21 DIAGNOSIS — M810 Age-related osteoporosis without current pathological fracture: Secondary | ICD-10-CM | POA: Diagnosis not present

## 2014-10-21 DIAGNOSIS — M150 Primary generalized (osteo)arthritis: Secondary | ICD-10-CM | POA: Diagnosis not present

## 2014-10-21 DIAGNOSIS — L299 Pruritus, unspecified: Secondary | ICD-10-CM | POA: Diagnosis not present

## 2014-10-21 DIAGNOSIS — K219 Gastro-esophageal reflux disease without esophagitis: Secondary | ICD-10-CM

## 2014-10-21 DIAGNOSIS — C3430 Malignant neoplasm of lower lobe, unspecified bronchus or lung: Secondary | ICD-10-CM

## 2014-10-21 DIAGNOSIS — K5900 Constipation, unspecified: Secondary | ICD-10-CM | POA: Diagnosis not present

## 2014-10-21 DIAGNOSIS — M159 Polyosteoarthritis, unspecified: Secondary | ICD-10-CM

## 2014-10-21 DIAGNOSIS — I10 Essential (primary) hypertension: Secondary | ICD-10-CM | POA: Diagnosis not present

## 2014-10-21 DIAGNOSIS — M8949 Other hypertrophic osteoarthropathy, multiple sites: Secondary | ICD-10-CM

## 2014-10-21 NOTE — Assessment & Plan Note (Addendum)
02/03/14 CT chest IMPRESSION: Stable bilateral pleural-parenchymal scarring most severe in the left lower lobe. No evidence of recurrent or metastatic carcinoma within the thorax. Hx of breast cancer and lung cancer 2011-s/p surgery and radiation. surveillance CT yearly.

## 2014-10-26 DIAGNOSIS — L299 Pruritus, unspecified: Secondary | ICD-10-CM | POA: Insufficient documentation

## 2014-10-26 NOTE — Assessment & Plan Note (Signed)
Controlled. Takes Atenolol 12.56m daily. ASA 81 and Niacin 5032mdaily for risk reduction.

## 2014-10-26 NOTE — Assessment & Plan Note (Signed)
Stable, takes Colace and fiber supplement.

## 2014-10-26 NOTE — Progress Notes (Signed)
Patient ID: [name], female   DOB: 27-Feb-1921, 80 y.o.   MRN: [id number]     Allergies  Allergen Reactions  . Ibuprofen Hives  . Naproxen   . Statins Other (See Comments)    Feels like knives in stomach  . Surgilube [Gyne-Moistrin]     Rectal itching, burning  . Tape Rash    Chief Complaint  Patient presents with  . Medical Management of Chronic Issues    blood prressure, anemia, osteoporois     HPI: Patient is a 80 y.o. female seen in the clinic at [facility] today for for evaluation of chronic medical conditions.  Problem List Items Addressed This Visit    Pruritus    Migratory skin itching w/o apparent rash identified. May use OTC Claritin or topical agents. Highly suspected dry skin    Osteoporosis - Primary    08/25/14 Bone density (Bone density every 2 year. Has been treated): distal left forearm 0.745(normal >-1.0, osteopenia <-1 and >-2.5, osteoporosis <-2.5). There have been statistically significant interval decreases in bone mineral density at the following sites compared to exam of 08/05/12 distal left radius -9.7% and limited lumbar spine -10%. Maintain adequate dietary intake of calcium and vitamin D, weight bearing exercise as tolerated.      Osteoarthritis    Resolved pain in her right forearm and thumb on and off since her fall 01/29/14 and strained her right arm. She wore brace for a while recommended by urgent care.      Lung cancer, lower lobe    02/03/14 CT chest IMPRESSION: Stable bilateral pleural-parenchymal scarring most severe in the left lower lobe. No evidence of recurrent or metastatic carcinoma within the thorax. Hx of breast cancer and lung cancer 2011-s/p surgery and radiation. surveillance CT yearly.      HTN (hypertension)    Controlled. Takes Atenolol 12.93m daily. ASA 81 and Niacin 5069mdaily for risk reduction.      GERD (Chronic)    Stable. Takes Omeprazole 2019maily.      Constipation    Stable, takes Colace and  fiber supplement.          Review of Systems:  Review of Systems  Constitutional: Negative for fever, chills, weight loss, malaise/fatigue and diaphoresis.  HENT: Positive for hearing loss. Negative for congestion, ear pain and sore throat.   Eyes: Negative for blurred vision, double vision, photophobia, pain, discharge and redness.  Respiratory: Negative for cough, hemoptysis, sputum production, shortness of breath, wheezing and stridor.   Cardiovascular: Negative for chest pain, palpitations, orthopnea, claudication and leg swelling.  Gastrointestinal: Negative for heartburn, nausea, vomiting, abdominal pain, diarrhea, constipation and blood in stool.  Genitourinary: Negative for dysuria, urgency, frequency, hematuria and flank pain.  Musculoskeletal: Positive for back pain. Negative for myalgias, joint pain (resolved left 1st MTJ pain. ), falls and neck pain.  Skin: Positive for itching. Negative for rash.       Concern of a skin lesion in the left nose region.   Neurological: Negative for dizziness, tingling, tremors, sensory change, speech change, focal weakness, seizures, loss of consciousness, weakness and headaches.  Endo/Heme/Allergies: Negative for environmental allergies and polydipsia. Does not bruise/bleed easily.  Psychiatric/Behavioral: Negative for depression, hallucinations and memory loss. The patient is not nervous/anxious and does not have insomnia.      Past Medical History  Diagnosis Date  . Lung cancer 05/2010    Left 2011 - rad x 3  . Rheumatic fever  Age 89  . Pneumonia     RLL with sepsis  . Biceps tendon rupture     Bilateral  . Elevated liver enzymes     Biopsy consistent with steatohepatitis  . Right thyroid nodule   . Anxiety   . Lymphedema     Left arm  . Overactive bladder   . PSVT (paroxysmal supraventricular tachycardia)   . Osteoporosis, senile   . Osteoarthritis   . Low back pain     scoliosis  . Hyperlipidemia   . GERD  (gastroesophageal reflux disease) 06/2007    EGD Dr Gala Romney >sm HH, multiple fundic gland polyps  . Type 2 diabetes mellitus   . Anemia     NOS  . Allergic rhinitis   . Insomnia   . Diverticulosis   . Hemorrhoids   . Hiatal hernia   . Steatohepatitis     liver bx  . Fundic gland polyps of stomach, benign   . Hx of radiation therapy 07/17/10 to 07/21/10    SRS LLL lung  . Breast cancer 2001    Left s/p lumpectomy and XRT   . Depression   . Glaucoma 2013    both eyes  . Cough 2014  . Bronchiectasis 2014    Noted on chest x-ray   Past Surgical History  Procedure Laterality Date  . Cataract extraction Bilateral 2003    Lens implant Dr. Charise Killian  . Cholecystectomy  1965  . Total hip arthroplasty Bilateral 2005 & 2007     Dr. Earl Lagos. Aline Brochure   . Abdominal hysterectomy  1964  . Breast lumpectomy  2001  . Cystoscopy  2007  . Breast biopsy      X 3 w/ cystectomy  . Revision total hip arthroplasty  2007    Left  . Liver biopsy  2008  . Colonoscopy  09/11/2011    Procedure: COLONOSCOPY;  Surgeon: Dorothyann Peng, MD;  Location: [facility];  Service: Endoscopy;  Laterality: N/A;  . Esophagogastroduodenoscopy  09/11/2011    Procedure: ESOPHAGOGASTRODUODENOSCOPY (EGD);  Surgeon: Dorothyann Peng, MD;  Location: [facility];  Service: Endoscopy;  Laterality: N/A;  . Colonoscopy  2005 /2012    Dr. Lucianne Muss- diverticulosis, ext hemorrhoids.  . Tonsillectomy  1930  . Appendectomy  1964  . Biopsy thyroid  11/2008  . Lung biopsy Left 06/06/2010   Social History:   reports that she has never smoked. She has never used smokeless tobacco. She reports that she does not drink alcohol or use illicit drugs.  Family History  Problem Relation Age of Onset  . Heart failure Mother   . Osteoporosis Mother   . Hypertension Father   . Stroke Father   . Heart failure Father   . Leukemia Brother   . Heart failure Brother   . Cancer    . Cancer Maternal Grandmother   . Stroke Maternal Grandfather       Medications: Patient's Medications  New Prescriptions   No medications on file  Previous Medications   ARTIFICIAL TEAR SOLUTION (TEARS NATURALE II OP)    Apply 1 drop to eye daily as needed.   ASPIRIN 81 MG EC TABLET    Take 81 mg by mouth daily.     ATENOLOL (TENORMIN) 25 MG TABLET    Take 1/2 tablet daily for blood pressure   BIOTIN 1000 MCG TABLET    Take 1,000 mcg by mouth every evening.    BRIMONIDINE (ALPHAGAN) 0.2 % OPHTHALMIC SOLUTION  Place 1 drop into both eyes. Once a day   CALCIUM CARBONATE-VITAMIN D (CALTRATE 600+D) 600-400 MG-UNIT PER TABLET    Take 1 tablet by mouth daily.     DOCUSATE CALCIUM (STOOL SOFTENER PO)    Take 1 capsule by mouth as needed.   FIBER TABS    Take 4 tablets by mouth daily.   FOLIC ACID (FOLVITE) 678 MCG TABLET    Take 400 mcg by mouth daily.     IRON PO    Take 27 mg by mouth daily.   LATANOPROST (XALATAN) 0.005 % OPHTHALMIC SOLUTION    Place 1 drop into both eyes daily.    MULTIPLE VITAMIN (MULTIVITAMIN) CAPSULE    Take 1 capsule by mouth daily.    NIACIN (SLO-NIACIN) 500 MG TABLET    Take 500 mg by mouth daily.     OMEPRAZOLE (PRILOSEC) 20 MG CAPSULE    Take 20 mg by mouth daily.     PSEUDOEPHEDRINE-DM-GG (SUDAFED COUGH PO)    Take 1 tablet by mouth as needed. For post nasal drip   VITAMIN B-12 (CYANOCOBALAMIN) 1000 MCG TABLET    Take 1,000 mcg by mouth daily.  Modified Medications   No medications on file  Discontinued Medications   No medications on file     Physical Exam: Physical Exam  Constitutional: She is oriented to person, place, and time. She appears well-developed and well-nourished. No distress.  HENT:  Right Ear: External ear normal. No lacerations. No drainage, swelling or tenderness. No foreign bodies. No mastoid tenderness. Tympanic membrane is not injected, not scarred, not perforated, not erythematous, not retracted and not bulging. Tympanic membrane mobility is normal. No middle ear effusion. No hemotympanum.  Decreased hearing is noted.  Left Ear: External ear normal. No lacerations. No drainage, swelling or tenderness. No foreign bodies. No mastoid tenderness. Tympanic membrane is not injected, not scarred, not perforated, not erythematous, not retracted and not bulging. Tympanic membrane mobility is normal.  No middle ear effusion. No hemotympanum. Decreased hearing is noted.  Nose: Nose normal.  Mouth/Throat: Oropharynx is clear and moist. No oropharyngeal exudate.  Eyes: Conjunctivae and EOM are normal. Pupils are equal, round, and reactive to light.  Neck: Normal range of motion. Neck supple. No JVD present. No tracheal deviation present. No thyromegaly present.  Cardiovascular: Normal rate, regular rhythm and normal heart sounds.  Exam reveals no gallop and no friction rub.   No murmur heard. Pulmonary/Chest: Effort normal. No respiratory distress. She has no wheezes. She has rales. She exhibits no tenderness.  Abdominal: Soft. Bowel sounds are normal. She exhibits no distension and no mass. There is no tenderness. There is no rebound.  Musculoskeletal: Normal range of motion. She exhibits no edema or tenderness.  Lymphadenopathy:    She has no cervical adenopathy.  Neurological: She is alert and oriented to person, place, and time. She has normal reflexes. No cranial nerve deficit.  Skin: Skin is warm and dry. No rash noted. No erythema. No pallor.  Psychiatric: She has a normal mood and affect. Her behavior is normal. Judgment and thought content normal.    Filed Vitals:   10/21/14 1526  BP: 120/62  Pulse: 72  Weight: 158 lb (71.668 kg)    Labs reviewed: Basic Metabolic Panel:  Recent Labs  02/03/14 0845 09/16/14  NA 136* 140  K 4.4 4.2  CL 96  --   CO2 29  --   GLUCOSE 91  --   BUN 16 17  CREATININE  1.16* 0.7  CALCIUM 10.1  --    Liver Function Tests:  Recent Labs  02/03/14 0845 09/16/14  AST 26 33  ALT 16 24  ALKPHOS 79 70  BILITOT 0.3  --   PROT 8.1  --    ALBUMIN 3.5  --     Past Procedures:  01/27/13 CT chest:   IMPRESSION:  Chronic parenchymal lung changes likely related to prior radiation  therapy and scarring.  No evidence of recurrent or metastatic disease.  Assessment/Plan Lung cancer, lower lobe 02/03/14 CT chest IMPRESSION: Stable bilateral pleural-parenchymal scarring most severe in the left lower lobe. No evidence of recurrent or metastatic carcinoma within the thorax. Hx of breast cancer and lung cancer 2011-s/p surgery and radiation. surveillance CT yearly.    Osteoporosis 08/25/14 Bone density (Bone density every 2 year. Has been treated): distal left forearm 0.745(normal >-1.0, osteopenia <-1 and >-2.5, osteoporosis <-2.5). There have been statistically significant interval decreases in bone mineral density at the following sites compared to exam of 08/05/12 distal left radius -9.7% and limited lumbar spine -10%. Maintain adequate dietary intake of calcium and vitamin D, weight bearing exercise as tolerated.    HTN (hypertension) Controlled. Takes Atenolol 12.47m daily. ASA 81 and Niacin 5067mdaily for risk reduction.    GERD Stable. Takes Omeprazole 2067maily.    Constipation Stable, takes Colace and fiber supplement.     Osteoarthritis Resolved pain in her right forearm and thumb on and off since her fall 01/29/14 and strained her right arm. She wore brace for a while recommended by urgent care.    Pruritus Migratory skin itching w/o apparent rash identified. May use OTC Claritin or topical agents. Highly suspected dry skin    Family/ Staff Communication: observe the patient.   Goals of Care: [place]  Labs/tests ordered: none

## 2014-10-26 NOTE — Assessment & Plan Note (Signed)
08/25/14 Bone density (Bone density every 2 year. Has been treated): distal left forearm 0.745(normal >-1.0, osteopenia <-1 and >-2.5, osteoporosis <-2.5). There have been statistically significant interval decreases in bone mineral density at the following sites compared to exam of 08/05/12 distal left radius -9.7% and limited lumbar spine -10%. Maintain adequate dietary intake of calcium and vitamin D, weight bearing exercise as tolerated.

## 2014-10-26 NOTE — Assessment & Plan Note (Signed)
Stable. Takes Omeprazole 40m daily.

## 2014-10-26 NOTE — Assessment & Plan Note (Signed)
Resolved pain in her right forearm and thumb on and off since her fall 01/29/14 and strained her right arm. She wore brace for a while recommended by urgent care.

## 2014-10-26 NOTE — Assessment & Plan Note (Signed)
Migratory skin itching w/o apparent rash identified. May use OTC Claritin or topical agents. Highly suspected dry skin

## 2014-12-21 ENCOUNTER — Other Ambulatory Visit: Payer: Self-pay | Admitting: Dermatology

## 2014-12-21 DIAGNOSIS — L308 Other specified dermatitis: Secondary | ICD-10-CM | POA: Diagnosis not present

## 2014-12-21 DIAGNOSIS — Z85828 Personal history of other malignant neoplasm of skin: Secondary | ICD-10-CM | POA: Diagnosis not present

## 2014-12-21 DIAGNOSIS — L820 Inflamed seborrheic keratosis: Secondary | ICD-10-CM | POA: Diagnosis not present

## 2014-12-21 DIAGNOSIS — C44319 Basal cell carcinoma of skin of other parts of face: Secondary | ICD-10-CM | POA: Diagnosis not present

## 2015-02-02 ENCOUNTER — Other Ambulatory Visit (HOSPITAL_COMMUNITY): Payer: Self-pay

## 2015-02-03 ENCOUNTER — Ambulatory Visit (HOSPITAL_COMMUNITY)
Admission: RE | Admit: 2015-02-03 | Discharge: 2015-02-03 | Disposition: A | Discharge status: Home / Self Care | Payer: Medicare Other | Source: Ambulatory Visit | Attending: Hematology and Oncology | Admitting: Hematology and Oncology

## 2015-02-03 ENCOUNTER — Encounter (HOSPITAL_COMMUNITY): Payer: Medicare Other | Attending: Hematology & Oncology | Admitting: Hematology & Oncology

## 2015-02-03 ENCOUNTER — Encounter (HOSPITAL_BASED_OUTPATIENT_CLINIC_OR_DEPARTMENT_OTHER): Payer: Medicare Other

## 2015-02-03 ENCOUNTER — Encounter (HOSPITAL_COMMUNITY): Payer: Self-pay | Admitting: Hematology & Oncology

## 2015-02-03 VITALS — BP 127/45 | HR 111 | Temp 98.0°F | Resp 16 | Wt 158.4 lb

## 2015-02-03 DIAGNOSIS — C3432 Malignant neoplasm of lower lobe, left bronchus or lung: Secondary | ICD-10-CM

## 2015-02-03 DIAGNOSIS — C3430 Malignant neoplasm of lower lobe, unspecified bronchus or lung: Secondary | ICD-10-CM

## 2015-02-03 DIAGNOSIS — Z853 Personal history of malignant neoplasm of breast: Secondary | ICD-10-CM | POA: Diagnosis not present

## 2015-02-03 DIAGNOSIS — C50919 Malignant neoplasm of unspecified site of unspecified female breast: Secondary | ICD-10-CM

## 2015-02-03 DIAGNOSIS — C50912 Malignant neoplasm of unspecified site of left female breast: Secondary | ICD-10-CM

## 2015-02-03 DIAGNOSIS — C3490 Malignant neoplasm of unspecified part of unspecified bronchus or lung: Secondary | ICD-10-CM | POA: Insufficient documentation

## 2015-02-03 DIAGNOSIS — Z9221 Personal history of antineoplastic chemotherapy: Secondary | ICD-10-CM | POA: Insufficient documentation

## 2015-02-03 LAB — COMPREHENSIVE METABOLIC PANEL
ALT: 21 U/L (ref 0–35)
AST: 30 U/L (ref 0–37)
Albumin: 3.8 g/dL (ref 3.5–5.2)
Alkaline Phosphatase: 80 U/L (ref 39–117)
Anion gap: 7 (ref 5–15)
BUN: 17 mg/dL (ref 6–23)
CALCIUM: 9.7 mg/dL (ref 8.4–10.5)
CHLORIDE: 102 mmol/L (ref 96–112)
CO2: 26 mmol/L (ref 19–32)
Creatinine, Ser: 1.14 mg/dL — ABNORMAL HIGH (ref 0.50–1.10)
GFR calc non Af Amer: 41 mL/min — ABNORMAL LOW (ref >90)
GFR, EST AFRICAN AMERICAN: 47 mL/min — AB (ref >90)
Glucose, Bld: 102 mg/dL — ABNORMAL HIGH (ref 70–99)
Potassium: 3.9 mmol/L (ref 3.5–5.1)
SODIUM: 135 mmol/L (ref 135–145)
Total Bilirubin: 0.6 mg/dL (ref 0.3–1.2)
Total Protein: 7.4 g/dL (ref 6.0–8.3)

## 2015-02-03 LAB — CBC WITH DIFFERENTIAL/PLATELET
Basophils Absolute: 0 10*3/uL (ref 0.0–0.1)
Basophils Relative: 0 % (ref 0–1)
EOS PCT: 2 % (ref 0–5)
Eosinophils Absolute: 0.2 10*3/uL (ref 0.0–0.7)
HEMATOCRIT: 35.3 % — AB (ref 36.0–46.0)
HEMOGLOBIN: 11.5 g/dL — AB (ref 12.0–15.0)
LYMPHS ABS: 1.2 10*3/uL (ref 0.7–4.0)
LYMPHS PCT: 16 % (ref 12–46)
MCH: 31.8 pg (ref 26.0–34.0)
MCHC: 32.6 g/dL (ref 30.0–36.0)
MCV: 97.5 fL (ref 78.0–100.0)
MONOS PCT: 10 % (ref 3–12)
Monocytes Absolute: 0.8 10*3/uL (ref 0.1–1.0)
NEUTROS PCT: 72 % (ref 43–77)
Neutro Abs: 5.3 10*3/uL (ref 1.7–7.7)
PLATELETS: 259 10*3/uL (ref 150–400)
RBC: 3.62 MIL/uL — ABNORMAL LOW (ref 3.87–5.11)
RDW: 13.4 % (ref 11.5–15.5)
WBC: 7.4 10*3/uL (ref 4.0–10.5)

## 2015-02-03 NOTE — Assessment & Plan Note (Signed)
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 She had CT scans performed today and the results are currently pending. She would like to change her follow-up to as needed. She states that if her cancer were to return she would not be pursuing any therapy. She feels she is doing well but does not feel at her age she would be a candidate for any additional aggressive treatments. We will call her with the results of her CT scans we discussed that if there was an abnormality noted she currently would not be interested in pursuing additional workup. I have advised her if she would change her mind she of course could let us know. We will see her back when necessary.

## 2015-02-03 NOTE — Progress Notes (Signed)
Labs for [id number],GXEX

## 2015-02-03 NOTE — Progress Notes (Signed)
GREEN, [name], MD [address] [place] [address]    DIAGNOSIS:Carcinoma of the L breast, 2001, s/p partial mastectomy, XRT and 5 years of arimidex Bronchoalveolar carcinoma of the lung, biopsy and tomotherapy with Dr. Lisbeth Renshaw in 07/2010  CURRENT THERAPY: Observation  INTERVAL HISTORY: [name] 81 y.o. female returns for follow-up of a history of breast cancer back in 2001 and will go alveolar carcinoma of the lung treated with radiation in August 2011. She had CT scan performed earlier today but results are not available. She states she is doing well. She lives in a personal care residence in [place]. 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 She states that she can hear with her hearing aids, see with her glasses, and get around with her walker and therefore she is doing well.  He is not interested in transferring care to [facility]. She states that her cancer were to return she would not pursue any therapy. She would like to leave additional follow-up on an as-needed basis. She no longer gets mammograms.  MEDICAL HISTORY: Past Medical History  Diagnosis Date  . Lung cancer 05/2010    Left 2011 - rad x 3  . Rheumatic fever     Age 52  . Pneumonia     RLL with sepsis  . Biceps tendon rupture     Bilateral  . Elevated liver enzymes     Biopsy consistent with steatohepatitis  . Right thyroid nodule   . Anxiety   . Lymphedema     Left arm  . Overactive bladder   . PSVT (paroxysmal supraventricular tachycardia)   . Osteoporosis, senile   . Osteoarthritis   . Low back pain     scoliosis  . Hyperlipidemia   . GERD (gastroesophageal reflux disease) 06/2007    EGD Dr Gala Romney >sm HH, multiple fundic gland polyps  . Type 2 diabetes mellitus   . Anemia     NOS  . Allergic rhinitis   . Insomnia   . Diverticulosis   . Hemorrhoids   . Hiatal hernia   . Steatohepatitis     liver bx  . Fundic gland polyps of stomach, benign   . Hx of radiation therapy 07/17/10 to  07/21/10    SRS LLL lung  . Breast cancer 2001    Left s/p lumpectomy and XRT   . Depression   . Glaucoma 2013    both eyes  . Cough 2014  . Bronchiectasis 2014    Noted on chest x-ray    has THYROID NODULE, RIGHT; DIABETES MELLITUS, TYPE II; ADRENAL MASS, BILATERAL; HYPERLIPIDEMIA; ANEMIA-NOS; ANXIETY; INTERDIGITAL NEUROMA; PAROXYSMAL SUPRAVENTRICULAR TACHYCARDIA; ALLERGIC RHINITIS; GERD; OVERACTIVE BLADDER; Osteoarthritis; HIP, ARTHRITIS, DEGEN./OSTEO; HIP PAIN; LOW BACK PAIN; IMPINGEMENT SYNDROME; WEAKNESS, MUSCLE; Osteoporosis; FATIGUE; PALPITATIONS, RECURRENT; DYSPNEA; TOTAL HIP FOLLOW-UP; PSVT (paroxysmal supraventricular tachycardia); Lung cancer, lower lobe; Hx of radiation therapy; Breast cancer; Rotator cuff syndrome of right shoulder; Bronchiectasis; Cough; HTN (hypertension); Constipation; Skin lesion of face; Hearing loss of both ears; Abdominal cramping, periumbilical; and Pruritus on her problem list.     is allergic to ibuprofen; naproxen; statins; surgilube; and tape.  Ms. Fogal does not currently have medications on file.  SURGICAL HISTORY: Past Surgical History  Procedure Laterality Date  . Cataract extraction Bilateral 2003    Lens implant Dr. Charise Killian  . Cholecystectomy  1965  . Total hip arthroplasty Bilateral 2005 & 2007     Dr. Earl Lagos. Aline Brochure   . Abdominal hysterectomy  1964  . Breast lumpectomy  2001  . Cystoscopy  2007  . Breast biopsy      X 3 w/ cystectomy  . Revision total hip arthroplasty  2007    Left  . Liver biopsy  2008  . Colonoscopy  09/11/2011    Procedure: COLONOSCOPY;  Surgeon: Dorothyann Peng, MD;  Location: [facility];  Service: Endoscopy;  Laterality: N/A;  . Esophagogastroduodenoscopy  09/11/2011    Procedure: ESOPHAGOGASTRODUODENOSCOPY (EGD);  Surgeon: Dorothyann Peng, MD;  Location: [facility];  Service: Endoscopy;  Laterality: N/A;  . Colonoscopy  2005 /2012    Dr. Lucianne Muss- diverticulosis, ext hemorrhoids.  . Tonsillectomy  1930    . Appendectomy  1964  . Biopsy thyroid  11/2008  . Lung biopsy Left 06/06/2010  . Skin cancer excision      nose and left lower cheek    SOCIAL HISTORY: History   Social History  . Marital Status: Widowed    Spouse Name: N/A  . Number of Children: 2  . Years of Education: college    Occupational History  . retired Tax Therapist, nutritional    Social History Main Topics  . Smoking status: Never Smoker   . Smokeless tobacco: Never Used     Comment: 2nd hand-husband  . Alcohol Use: No  . Drug Use: No  . Sexual Activity: Not Currently   Other Topics Concern  . Not on file   Social History Narrative   Lives at [place] 02/2013   2 adopted children   Was in [place], [place]   Widowed 2013   Walks with walker   Exercise: none    POA - Living Will    FAMILY HISTORY: Family History  Problem Relation Age of Onset  . Heart failure Mother   . Osteoporosis Mother   . Hypertension Father   . Stroke Father   . Heart failure Father   . Leukemia Brother   . Heart failure Brother   . Cancer    . Cancer Maternal Grandmother   . Stroke Maternal Grandfather     Review of Systems  HENT: Positive for hearing loss.   Eyes:       Wears glasses  Respiratory: Negative.   Cardiovascular: Negative.   Gastrointestinal: Positive for constipation.       Uses stool softeners  Genitourinary: Positive for urgency.  Musculoskeletal:       Uses walker  Skin: Negative.   Neurological: Positive for weakness. Negative for dizziness, tingling, tremors, sensory change, speech change, focal weakness, seizures and loss of consciousness.  Endo/Heme/Allergies: Negative.   Psychiatric/Behavioral: Negative.     PHYSICAL EXAMINATION  ECOG PERFORMANCE STATUS: 1 - Symptomatic but completely ambulatory  Filed Vitals:   02/03/15 1300  BP: 127/45  Pulse: 111  Temp: 98 F (36.7 C)  Resp: 16    Physical Exam  Constitutional: She is oriented to person, place, and time and well-developed,  well-nourished, and in no distress.  Appears younger than stated age, uses a walker  HENT:  Head: Normocephalic and atraumatic.  Nose: Nose normal.  Mouth/Throat: Oropharynx is clear and moist. No oropharyngeal exudate.  Eyes: Conjunctivae and EOM are normal. Pupils are equal, round, and reactive to light. Right eye exhibits no discharge. Left eye exhibits no discharge. No scleral icterus.  Neck: Normal range of motion. Neck supple. No tracheal deviation present. No thyromegaly present.  Cardiovascular: Normal rate, regular rhythm and normal heart sounds.  Exam reveals no  gallop and no friction rub.   No murmur heard. Pulmonary/Chest: Effort normal and breath sounds normal. She has no wheezes. She has no rales.  Abdominal: Soft. Bowel sounds are normal. She exhibits no distension and no mass. There is no tenderness. There is no rebound and no guarding.  Musculoskeletal: Normal range of motion. She exhibits no edema.  Lymphadenopathy:    She has no cervical adenopathy.  Neurological: She is alert and oriented to person, place, and time. She has normal reflexes. No cranial nerve deficit. Coordination normal.  Skin: Skin is warm and dry. No rash noted.  Psychiatric: Mood, memory, affect and judgment normal.  Nursing note and vitals reviewed.   LABORATORY DATA:  CBC    Component Value Date/Time   WBC 7.4 02/03/2015 1315   WBC 6.8 09/16/2014   WBC 8.1 07/20/2013 1200   RBC 3.62* 02/03/2015 1315   RBC 3.88* 07/20/2013 1200   RBC 3.12* 09/10/2011 1230   HGB 11.5* 02/03/2015 1315   HGB 12.3 07/20/2013 1200   HCT 35.3* 02/03/2015 1315   HCT 38.3 07/20/2013 1200   PLT 259 02/03/2015 1315   MCV 97.5 02/03/2015 1315   MCV 98.8* 07/20/2013 1200   MCH 31.8 02/03/2015 1315   MCH 31.7* 07/20/2013 1200   MCHC 32.6 02/03/2015 1315   MCHC 32.1 07/20/2013 1200   RDW 13.4 02/03/2015 1315   LYMPHSABS 1.2 02/03/2015 1315   MONOABS 0.8 02/03/2015 1315   EOSABS 0.2 02/03/2015 1315   BASOSABS  0.0 02/03/2015 1315   CMP     Component Value Date/Time   NA 135 02/03/2015 1315   NA 140 09/16/2014   K 3.9 02/03/2015 1315   CL 102 02/03/2015 1315   CO2 26 02/03/2015 1315   GLUCOSE 102* 02/03/2015 1315   BUN 17 02/03/2015 1315   BUN 17 09/16/2014   CREATININE 1.14* 02/03/2015 1315   CREATININE 0.7 09/16/2014   CALCIUM 9.7 02/03/2015 1315   PROT 7.4 02/03/2015 1315   ALBUMIN 3.8 02/03/2015 1315   AST 30 02/03/2015 1315   ALT 21 02/03/2015 1315   ALKPHOS 80 02/03/2015 1315   BILITOT 0.6 02/03/2015 1315   GFRNONAA 41* 02/03/2015 1315   GFRAA 56* 02/03/2015 1315    RADIOGRAPHIC STUDIES:  Pending   ASSESSMENT and THERAPY PLAN:    Lung cancer, lower lobe Pleasant 81 year old female with a 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 She had CT scans performed today and the results are currently pending. She would like to change her follow-up to as needed. She states that if her cancer were to return she would not be pursuing any therapy. She feels she is doing well but does not feel at her age she would be a candidate for any additional aggressive treatments. We will call her with the results of her CT scans we discussed that if there was an abnormality noted she currently would not be interested in pursuing additional workup. I have advised her if she would change her mind she of course could let us know. We will see her back when necessary.   Breast cancer She had breast cancer back in 2001. She stop doing mammograms about 2 years ago. She is not interested in a breast exam today. I have advised her if at any point she would like to come back for breast exam or to pursue mammography to let me know. She is still in reasonably good health. But as detailed we  will just see her back as needed per her request.     All questions were answered. The patient knows to call the clinic with any problems, questions or concerns. We  can certainly see the patient much sooner if necessary. This note was electronically signed. Molli Hazard 02/03/2015

## 2015-02-03 NOTE — Assessment & Plan Note (Signed)
She had breast cancer back in 2001. She stop doing mammograms about 2 years ago. She is not interested in a breast exam today. I have advised her if at any point she would like to come back for breast exam or to pursue mammography to let me know. She is still in reasonably good health. But as detailed we will just see her back as needed per her request.

## 2015-02-03 NOTE — Patient Instructions (Signed)
[place] at [facility] Discharge Instructions  RECOMMENDATIONS MADE BY THE CONSULTANT AND ANY TEST RESULTS WILL BE SENT TO YOUR REFERRING PHYSICIAN.  Exam and discussion by Dr. Whitney Muse.  Labs we have so far are stable.  Don't know your scan results. Report any issues or concerns.  If scans are ok will change office visit to "as needed".  If scans show abnormality we will contact you with follow-up.  Thank you for choosing [place] at [facility] to provide your oncology and hematology care.  To afford each patient quality time with our provider, please arrive at least 15 minutes before your scheduled appointment time.    You need to re-schedule your appointment should you arrive 10 or more minutes late.  We strive to give you quality time with our providers, and arriving late affects you and other patients whose appointments are after yours.  Also, if you no show three or more times for appointments you may be dismissed from the clinic at the providers discretion.     Again, thank you for choosing [facility].  Our hope is that these requests will decrease the amount of time that you wait before being seen by our physicians.       _____________________________________________________________  Should you have questions after your visit to [facility], please contact our office at [phone] between the hours of 8:30 a.m. and 4:30 p.m.  Voicemails left after 4:30 p.m. will not be returned until the following business day.  For prescription refill requests, have your pharmacy contact our office.

## 2015-02-04 LAB — CEA: CEA: 5.8 ng/mL — AB (ref 0.0–4.7)

## 2015-02-04 LAB — CANCER ANTIGEN 27.29: CA 27.29: 38.1 U/mL (ref 0.0–38.6)

## 2015-02-11 ENCOUNTER — Telehealth (HOSPITAL_COMMUNITY): Payer: Self-pay

## 2015-02-11 NOTE — Telephone Encounter (Signed)
Wants to know results of CT of chest from 02/03/15 and whether or not she needs to come back for follow-up.  Can be reached at [phone].

## 2015-02-11 NOTE — Telephone Encounter (Signed)
Patient notified that CT was ok and that she does not need any follow-up unless she has problems.  Verbalized understanding of instructions.

## 2015-02-14 ENCOUNTER — Other Ambulatory Visit: Payer: Self-pay | Admitting: Internal Medicine

## 2015-02-14 ENCOUNTER — Other Ambulatory Visit: Payer: Self-pay | Admitting: Nurse Practitioner

## 2015-02-15 DIAGNOSIS — H4011X3 Primary open-angle glaucoma, severe stage: Secondary | ICD-10-CM | POA: Diagnosis not present

## 2015-04-21 ENCOUNTER — Encounter: Payer: Self-pay | Admitting: Nurse Practitioner

## 2015-04-21 ENCOUNTER — Non-Acute Institutional Stay: Payer: Medicare Other | Admitting: Nurse Practitioner

## 2015-04-21 VITALS — BP 126/62 | HR 68 | Temp 97.3°F | Wt 156.0 lb

## 2015-04-21 DIAGNOSIS — I10 Essential (primary) hypertension: Secondary | ICD-10-CM

## 2015-04-21 DIAGNOSIS — M8949 Other hypertrophic osteoarthropathy, multiple sites: Secondary | ICD-10-CM

## 2015-04-21 DIAGNOSIS — C3430 Malignant neoplasm of lower lobe, unspecified bronchus or lung: Secondary | ICD-10-CM

## 2015-04-21 DIAGNOSIS — M159 Polyosteoarthritis, unspecified: Secondary | ICD-10-CM

## 2015-04-21 DIAGNOSIS — K5900 Constipation, unspecified: Secondary | ICD-10-CM | POA: Diagnosis not present

## 2015-04-21 DIAGNOSIS — K219 Gastro-esophageal reflux disease without esophagitis: Secondary | ICD-10-CM

## 2015-04-21 DIAGNOSIS — E785 Hyperlipidemia, unspecified: Secondary | ICD-10-CM | POA: Diagnosis not present

## 2015-04-21 DIAGNOSIS — M150 Primary generalized (osteo)arthritis: Secondary | ICD-10-CM

## 2015-04-21 DIAGNOSIS — L299 Pruritus, unspecified: Secondary | ICD-10-CM | POA: Diagnosis not present

## 2015-04-29 NOTE — Assessment & Plan Note (Signed)
Ambulates with walker and power scooter to go further.

## 2015-04-29 NOTE — Assessment & Plan Note (Addendum)
Migratory skin itching w/o apparent rash identified. May use OTC Claritin or topical agents. Highly suspected dry skin. The patient prefers Benadryl prn. I suggested to hold Niacin for a few days to eliminate possible contributory cause.

## 2015-04-29 NOTE — Assessment & Plan Note (Addendum)
Controlled. Takes Atenolol 12.32m daily. ASA 81 and Niacin 5059mdaily for risk reduction. Update TSH and CMP

## 2015-04-29 NOTE — Assessment & Plan Note (Signed)
Stable. Takes Omeprazole 21m daily.

## 2015-04-29 NOTE — Assessment & Plan Note (Signed)
Continue Zetia, update lipid panel.

## 2015-04-29 NOTE — Progress Notes (Signed)
Patient ID: [name], female   DOB: September 08, 1921, 81 y.o.   MRN: [id number]   Code Status: not on file   Allergies  Allergen Reactions  . Ibuprofen Hives  . Naproxen   . Statins Other (See Comments)    Feels like knives in stomach  . Surgilube [Gyne-Moistrin]     Rectal itching, burning  . Tape Rash    Chief Complaint  Patient presents with  . Medical Management of Chronic Issues    blood pressure, GERD  . Pruritis    gets better some times, if she takes Benadryl too much it drys her nose out    HPI: Patient is a 81 y.o. female seen in the clinic at [facility] today for chronic medical conditions.  Problem List Items Addressed This Visit    Hyperlipidemia (Chronic)    Continue Zetia, update lipid panel.       GERD (Chronic)    Stable. Takes Omeprazole 88m daily.         Osteoarthritis    Ambulates with walker and power scooter to go further.       Lung cancer, lower lobe    02/03/14 CT chest IMPRESSION: Stable bilateral pleural-parenchymal scarring most severe in the left lower lobe. No evidence of recurrent or metastatic carcinoma within the thorax.  02/03/15 CT chest wo contrast: IMPRESSION: 1. Stable chronic lung disease, likely in part secondary to atypical mycobacterial infection superimposed on radiation changes. 2. No evidence of metastatic disease or acute findings.          HTN (hypertension)    Controlled. Takes Atenolol 12.540mdaily. ASA 81 and Niacin 50075maily for risk reduction. Update TSH and CMP       Constipation    Stable, takes Colace and fiber supplement.         Pruritus - Primary    Migratory skin itching w/o apparent rash identified. May use OTC Claritin or topical agents. Highly suspected dry skin. The patient prefers Benadryl prn. I suggested to hold Niacin for a few days to eliminate possible contributory cause.           Review of Systems:  Review of Systems  Constitutional: Negative for fever, chills  and diaphoresis.  HENT: Positive for hearing loss. Negative for congestion, ear pain and sore throat.   Eyes: Negative for photophobia, pain, discharge and redness.  Respiratory: Negative for cough, shortness of breath, wheezing and stridor.   Cardiovascular: Negative for chest pain, palpitations and leg swelling.  Gastrointestinal: Negative for nausea, vomiting, abdominal pain, diarrhea, constipation and blood in stool.  Endocrine: Negative for polydipsia.  Genitourinary: Negative for dysuria, urgency, frequency, hematuria and flank pain.  Musculoskeletal: Positive for back pain. Negative for myalgias and neck pain.  Skin: Negative for rash.       Chronic itching allover w/o apparent rash-takes Benadryl prn which is effective.   Allergic/Immunologic: Negative for environmental allergies.  Neurological: Negative for dizziness, tremors, seizures, weakness and headaches.  Hematological: Does not bruise/bleed easily.  Psychiatric/Behavioral: Negative for hallucinations. The patient is not nervous/anxious.       Past Medical History  Diagnosis Date  . Lung cancer 05/2010    Left 2011 - rad x 3  . Rheumatic fever     Age [age] Pneumonia     RLL with sepsis  . Biceps tendon rupture     Bilateral  . Elevated liver enzymes     Biopsy consistent with steatohepatitis  . Right  thyroid nodule   . Anxiety   . Lymphedema     Left arm  . Overactive bladder   . PSVT (paroxysmal supraventricular tachycardia)   . Osteoporosis, senile   . Osteoarthritis   . Low back pain     scoliosis  . Hyperlipidemia   . GERD (gastroesophageal reflux disease) 06/2007    EGD Dr Gala Romney >sm HH, multiple fundic gland polyps  . Type 2 diabetes mellitus   . Anemia     NOS  . Allergic rhinitis   . Insomnia   . Diverticulosis   . Hemorrhoids   . Hiatal hernia   . Steatohepatitis     liver bx  . Fundic gland polyps of stomach, benign   . Hx of radiation therapy 07/17/10 to 07/21/10    SRS LLL lung  . Breast  cancer 2001    Left s/p lumpectomy and XRT   . Depression   . Glaucoma 2013    both eyes  . Cough 2014  . Bronchiectasis 2014    Noted on chest x-ray   Past Surgical History  Procedure Laterality Date  . Cataract extraction Bilateral 2003    Lens implant Dr. Charise Killian  . Cholecystectomy  1965  . Total hip arthroplasty Bilateral 2005 & 2007     Dr. Earl Lagos. Aline Brochure   . Abdominal hysterectomy  1964  . Breast lumpectomy  2001  . Cystoscopy  2007  . Breast biopsy      X 3 w/ cystectomy  . Revision total hip arthroplasty  2007    Left  . Liver biopsy  2008  . Colonoscopy  09/11/2011    Procedure: COLONOSCOPY;  Surgeon: Dorothyann Peng, MD;  [facility];  Service: Endoscopy;  Laterality: N/A;  . Esophagogastroduodenoscopy  09/11/2011    Procedure: ESOPHAGOGASTRODUODENOSCOPY (EGD);  Surgeon: Dorothyann Peng, MD;  Location: [facility];  Service: Endoscopy;  Laterality: N/A;  . Colonoscopy  2005 /2012    Dr. Lucianne Muss- diverticulosis, ext hemorrhoids.  . Tonsillectomy  1930  . Appendectomy  1964  . Biopsy thyroid  11/2008  . Lung biopsy Left 06/06/2010  . Skin cancer excision      nose and left lower cheek   Social History:   reports that she has never smoked. She has never used smokeless tobacco. She reports that she does not drink alcohol or use illicit drugs.  Family History  Problem Relation Age of Onset  . Heart failure Mother   . Osteoporosis Mother   . Hypertension Father   . Stroke Father   . Heart failure Father   . Leukemia Brother   . Heart failure Brother   . Cancer    . Cancer Maternal Grandmother   . Stroke Maternal Grandfather     Medications: Patient's Medications  New Prescriptions   No medications on file  Previous Medications   ARTIFICIAL TEAR SOLUTION (TEARS NATURALE II OP)    Apply 1 drop to eye daily as needed.   ASPIRIN 81 MG EC TABLET    Take 81 mg by mouth daily.     ATENOLOL (TENORMIN) 25 MG TABLET    TAKE (1/2) TABLET DAILY FOR  BLOOD PRESSURE.   BIOTIN 1000 MCG TABLET    Take 1,000 mcg by mouth every evening.    BRIMONIDINE (ALPHAGAN) 0.2 % OPHTHALMIC SOLUTION    Place 1 drop into both eyes. Once a day   CALCIUM CARBONATE-VITAMIN D (CALTRATE 600+D) 600-400 MG-UNIT PER TABLET  Take 1 tablet by mouth daily.     DOCUSATE CALCIUM (STOOL SOFTENER PO)    Take 1 capsule by mouth as needed.   FIBER TABS    Take 4 tablets by mouth daily.   FOLIC ACID (FOLVITE) 270 MCG TABLET    Take 400 mcg by mouth daily.     IRON PO    Take 27 mg by mouth daily.   LATANOPROST (XALATAN) 0.005 % OPHTHALMIC SOLUTION    Place 1 drop into both eyes daily.    MULTIPLE VITAMIN (MULTIVITAMIN) CAPSULE    Take 1 capsule by mouth daily.    NIACIN (SLO-NIACIN) 500 MG TABLET    Take 500 mg by mouth daily.     OMEPRAZOLE (PRILOSEC) 20 MG CAPSULE    TAKE 1 CAPSULE EVERY DAY.   PSEUDOEPHEDRINE-DM-GG (SUDAFED COUGH PO)    Take 1 tablet by mouth as needed. For post nasal drip   VITAMIN B-12 (CYANOCOBALAMIN) 1000 MCG TABLET    Take 1,000 mcg by mouth daily.  Modified Medications   No medications on file  Discontinued Medications   No medications on file     Physical Exam: Physical Exam  Constitutional: She is oriented to person, place, and time. She appears well-developed and well-nourished. No distress.  HENT:  Right Ear: External ear normal. No lacerations. No drainage, swelling or tenderness. No foreign bodies. No mastoid tenderness. Tympanic membrane is not injected, not scarred, not perforated, not erythematous, not retracted and not bulging. Tympanic membrane mobility is normal. No middle ear effusion. No hemotympanum. Decreased hearing is noted.  Left Ear: External ear normal. No lacerations. No drainage, swelling or tenderness. No foreign bodies. No mastoid tenderness. Tympanic membrane is not injected, not scarred, not perforated, not erythematous, not retracted and not bulging. Tympanic membrane mobility is normal.  No middle ear effusion. No  hemotympanum. Decreased hearing is noted.  Nose: Nose normal.  Mouth/Throat: Oropharynx is clear and moist. No oropharyngeal exudate.  Eyes: Conjunctivae and EOM are normal. Pupils are equal, round, and reactive to light.  Neck: Normal range of motion. Neck supple. No JVD present. No tracheal deviation present. No thyromegaly present.  Cardiovascular: Normal rate, regular rhythm and normal heart sounds.  Exam reveals no gallop and no friction rub.   No murmur heard. Pulmonary/Chest: Effort normal. No respiratory distress. She has no wheezes. She has rales. She exhibits no tenderness.  Abdominal: Soft. Bowel sounds are normal. She exhibits no distension and no mass. There is no tenderness. There is no rebound.  Musculoskeletal: Normal range of motion. She exhibits no edema or tenderness.  Lymphadenopathy:    She has no cervical adenopathy.  Neurological: She is alert and oriented to person, place, and time. She has normal reflexes. No cranial nerve deficit.  Skin: Skin is warm and dry. No rash noted. No erythema. No pallor.  Psychiatric: She has a normal mood and affect. Her behavior is normal. Judgment and thought content normal.    Filed Vitals:   04/21/15 1400  BP: 126/62  Pulse: 68  Temp: 97.3 F (36.3 C)  TempSrc: Oral  Weight: 156 lb (70.761 kg)      Labs reviewed: Basic Metabolic Panel:  Recent Labs  09/16/14 02/03/15 1315  NA 140 135  K 4.2 3.9  CL  --  102  CO2  --  26  GLUCOSE  --  102*  BUN 17 17  CREATININE 0.7 1.14*  CALCIUM  --  9.7   Liver Function Tests:  Recent  Labs  09/16/14 02/03/15 1315  AST 33 30  ALT 24 21  ALKPHOS 70 80  BILITOT  --  0.6  PROT  --  7.4  ALBUMIN  --  3.8   No results for input(s): LIPASE, AMYLASE in the last 8760 hours. No results for input(s): AMMONIA in the last 8760 hours. CBC:  Recent Labs  09/16/14 02/03/15 1315  WBC 6.8 7.4  NEUTROABS  --  5.3  HGB 11.3* 11.5*  HCT 36 35.3*  MCV  --  97.5  PLT 211 259    Lipid Panel: No results for input(s): CHOL, HDL, LDLCALC, TRIG, CHOLHDL, LDLDIRECT in the last 8760 hours. Anemia Panel: No results for input(s): FOLATE, IRON, VITAMINB12 in the last 8760 hours.  Past Procedures:  02/03/15 CT chest wo contrast:  IMPRESSION: 1. Stable chronic lung disease, likely in part secondary to atypical mycobacterial infection superimposed on radiation changes. 2. No evidence of metastatic disease or acute findings.  Assessment/Plan Pruritus Migratory skin itching w/o apparent rash identified. May use OTC Claritin or topical agents. Highly suspected dry skin. The patient prefers Benadryl prn. I suggested to hold Niacin for a few days to eliminate possible contributory cause.     HTN (hypertension) Controlled. Takes Atenolol 12.34m daily. ASA 81 and Niacin 5030mdaily for risk reduction. Update TSH and CMP    Constipation Stable, takes Colace and fiber supplement.      GERD Stable. Takes Omeprazole 2050maily.      Osteoarthritis Ambulates with walker and power scooter to go further.    Hyperlipidemia Continue Zetia, update lipid panel.    Lung cancer, lower lobe 02/03/14 CT chest IMPRESSION: Stable bilateral pleural-parenchymal scarring most severe in the left lower lobe. No evidence of recurrent or metastatic carcinoma within the thorax.  02/03/15 CT chest wo contrast: IMPRESSION: 1. Stable chronic lung disease, likely in part secondary to atypical mycobacterial infection superimposed on radiation changes. 2. No evidence of metastatic disease or acute findings.         Family/ Staff Communication: observe the patient.   Goals of Care: IL  Labs/tests ordered: CBC, CMP, TSH, lipid panel.

## 2015-04-29 NOTE — Assessment & Plan Note (Signed)
Stable, takes Colace and fiber supplement.

## 2015-04-29 NOTE — Assessment & Plan Note (Signed)
02/03/14 CT chest IMPRESSION: Stable bilateral pleural-parenchymal scarring most severe in the left lower lobe. No evidence of recurrent or metastatic carcinoma within the thorax.  02/03/15 CT chest wo contrast: IMPRESSION: 1. Stable chronic lung disease, likely in part secondary to atypical mycobacterial infection superimposed on radiation changes. 2. No evidence of metastatic disease or acute findings.

## 2015-05-03 DIAGNOSIS — C4441 Basal cell carcinoma of skin of scalp and neck: Secondary | ICD-10-CM | POA: Diagnosis not present

## 2015-05-03 DIAGNOSIS — L570 Actinic keratosis: Secondary | ICD-10-CM | POA: Diagnosis not present

## 2015-05-03 DIAGNOSIS — L821 Other seborrheic keratosis: Secondary | ICD-10-CM | POA: Diagnosis not present

## 2015-05-03 DIAGNOSIS — Z85828 Personal history of other malignant neoplasm of skin: Secondary | ICD-10-CM | POA: Diagnosis not present

## 2015-05-17 ENCOUNTER — Other Ambulatory Visit: Payer: Self-pay | Admitting: Internal Medicine

## 2015-06-13 ENCOUNTER — Other Ambulatory Visit: Payer: Self-pay

## 2015-06-14 ENCOUNTER — Ambulatory Visit (INDEPENDENT_AMBULATORY_CARE_PROVIDER_SITE_OTHER): Payer: Medicare Other | Admitting: Nurse Practitioner

## 2015-06-14 ENCOUNTER — Ambulatory Visit
Admission: RE | Admit: 2015-06-14 | Discharge: 2015-06-14 | Disposition: A | Discharge status: Home / Self Care | Payer: Medicare Other | Source: Ambulatory Visit | Attending: Nurse Practitioner | Admitting: Nurse Practitioner

## 2015-06-14 ENCOUNTER — Encounter: Payer: Self-pay | Admitting: Nurse Practitioner

## 2015-06-14 VITALS — BP 128/84 | HR 70 | Temp 97.3°F | Resp 16 | Ht 63.5 in | Wt 152.6 lb

## 2015-06-14 DIAGNOSIS — R0602 Shortness of breath: Secondary | ICD-10-CM | POA: Diagnosis not present

## 2015-06-14 DIAGNOSIS — K5900 Constipation, unspecified: Secondary | ICD-10-CM | POA: Diagnosis not present

## 2015-06-14 DIAGNOSIS — R002 Palpitations: Secondary | ICD-10-CM | POA: Diagnosis not present

## 2015-06-14 MED ORDER — POLYETHYLENE GLYCOL 3350 17 GM/SCOOP PO POWD: 17.0000 g | ORAL | Status: DC

## 2015-06-14 NOTE — Patient Instructions (Signed)
Will get blood work today Chest xray for further evaluation

## 2015-06-14 NOTE — Progress Notes (Signed)
Patient ID: HAISLEE CORSO, female   DOB: 11-13-1921, 81 y.o.   MRN: [id number]    PCP: Estill Dooms, MD  Allergies  Allergen Reactions  . Ibuprofen Hives  . Naproxen   . Statins Other (See Comments)    Feels like knives in stomach  . Surgilube [Gyne-Moistrin]     Rectal itching, burning  . Tape Rash    Chief Complaint  Patient presents with  . Acute Visit    SOB off/on x couple of months, worse in the evening. Patient gets this feeling of pressure in head/neck area, sweats, and then SOB onsets. Last episode this am.      HPI: Patient is a 81 y.o. female seen in the office today due to shortness of breath for a couple of months off and on. Reports pressure in her head and throat and then she she will have sweats and get short of breath. Reports mostly in the evening. Feeling that she is not getting enough oxygen when she is taking a deep breath. Able to get the air in and feeling that she has to take a deep breath. Nurse took o2 sats and her O2 was 98% was placed on oxygen at 2L and it did not help the feeling.  Not walking as much due to shoulder pain not able to use walker. Feels like she is getting weaker from not being able to walk. More short of breath with walking as well. Denies chest pains, no cough or congestion, does not hurt to breath, no dizziness, no nausea, no anxiety or depression Pressure in neck.  Pt did not smoke but she has lived with a smoker her entire life, hx of lung cancer s/p radiation, 5th year CT scan in February was without metastatic disease or changes  Hx of post nasal drip, which is chronic and stable.     Review of Systems:  Review of Systems  Constitutional: Negative for activity change, appetite change, fatigue and unexpected weight change.  HENT: Negative for congestion and hearing loss.   Eyes: Negative.   Respiratory: Negative for cough and shortness of breath.   Cardiovascular: Negative for chest pain, palpitations and leg swelling.    Gastrointestinal: Negative for abdominal pain, diarrhea and constipation.  Genitourinary: Negative for dysuria and difficulty urinating.  Musculoskeletal: Negative for myalgias and arthralgias.  Skin: Negative for color change and wound.  Neurological: Negative for dizziness and weakness.  Psychiatric/Behavioral: Negative for behavioral problems, confusion and agitation.    Past Medical History  Diagnosis Date  . Lung cancer 05/2010    Left 2011 - rad x 3  . Rheumatic fever     Age 40  . Pneumonia     RLL with sepsis  . Biceps tendon rupture     Bilateral  . Elevated liver enzymes     Biopsy consistent with steatohepatitis  . Right thyroid nodule   . Anxiety   . Lymphedema     Left arm  . Overactive bladder   . PSVT (paroxysmal supraventricular tachycardia)   . Osteoporosis, senile   . Osteoarthritis   . Low back pain     scoliosis  . Hyperlipidemia   . GERD (gastroesophageal reflux disease) 06/2007    EGD Dr Gala Romney >sm HH, multiple fundic gland polyps  . Type 2 diabetes mellitus   . Anemia     NOS  . Allergic rhinitis   . Insomnia   . Diverticulosis   . Hemorrhoids   . Hiatal hernia   .  Steatohepatitis     liver bx  . Fundic gland polyps of stomach, benign   . Hx of radiation therapy 07/17/10 to 07/21/10    SRS LLL lung  . Breast cancer 2001    Left s/p lumpectomy and XRT   . Depression   . Glaucoma 2013    both eyes  . Cough 2014  . Bronchiectasis 2014    Noted on chest x-ray   Past Surgical History  Procedure Laterality Date  . Cataract extraction Bilateral 2003    Lens implant Dr. Charise Killian  . Cholecystectomy  1965  . Total hip arthroplasty Bilateral 2005 & 2007     Dr. Earl Lagos. Aline Brochure   . Abdominal hysterectomy  1964  . Breast lumpectomy  2001  . Cystoscopy  2007  . Breast biopsy      X 3 w/ cystectomy  . Revision total hip arthroplasty  2007    Left  . Liver biopsy  2008  . Colonoscopy  09/11/2011    Procedure: COLONOSCOPY;  Surgeon: Dorothyann Peng, MD;  [facility]: [facility];  Service: Endoscopy;  Laterality: N/A;  . Esophagogastroduodenoscopy  09/11/2011    Procedure: ESOPHAGOGASTRODUODENOSCOPY (EGD);  Surgeon: Dorothyann Peng, MD;  [facility]: [facility];  Service: Endoscopy;  Laterality: N/A;  . Colonoscopy  2005 /2012    Dr. Lucianne Muss- diverticulosis, ext hemorrhoids.  . Tonsillectomy  1930  . Appendectomy  1964  . Biopsy thyroid  11/2008  . Lung biopsy Left 06/06/2010  . Skin cancer excision      nose and left lower cheek   Social History:   reports that she has never smoked. She has never used smokeless tobacco. She reports that she does not drink alcohol or use illicit drugs.  Family History  Problem Relation Age of Onset  . Heart failure Mother   . Osteoporosis Mother   . Hypertension Father   . Stroke Father   . Heart failure Father   . Leukemia Brother   . Heart failure Brother   . Cancer    . Cancer Maternal Grandmother   . Stroke Maternal Grandfather     Medications: Patient's Medications  New Prescriptions   No medications on file  Previous Medications   ARTIFICIAL TEAR SOLUTION (TEARS NATURALE II OP)    Apply 1 drop to eye daily as needed.   ASPIRIN 81 MG EC TABLET    Take 81 mg by mouth daily.     ATENOLOL (TENORMIN) 25 MG TABLET    TAKE (1/2) TABLET DAILY FOR BLOOD PRESSURE.   BIOTIN 1000 MCG TABLET    Take 1,000 mcg by mouth every evening.    BRIMONIDINE (ALPHAGAN) 0.2 % OPHTHALMIC SOLUTION    Place 1 drop into both eyes. Once a day   CALCIUM CARBONATE-VITAMIN D (CALTRATE 600+D) 600-400 MG-UNIT PER TABLET    Take 1 tablet by mouth daily.     DOCUSATE CALCIUM (STOOL SOFTENER PO)    Take 1 capsule by mouth as needed.   FIBER TABS    Take 4 tablets by mouth daily.   FOLIC ACID (FOLVITE) 341 MCG TABLET    Take 400 mcg by mouth daily.     IRON PO    Take 27 mg by mouth daily.   LATANOPROST (XALATAN) 0.005 % OPHTHALMIC SOLUTION    Place 1 drop into both eyes daily.    MULTIPLE VITAMIN (MULTIVITAMIN)  CAPSULE    Take 1 capsule by mouth daily.    OMEPRAZOLE (PRILOSEC)  20 MG CAPSULE    TAKE 1 CAPSULE EVERY DAY.   PSEUDOEPHEDRINE-DM-GG (SUDAFED COUGH PO)    Take 1 tablet by mouth as needed. For post nasal drip   VITAMIN B-12 (CYANOCOBALAMIN) 1000 MCG TABLET    Take 1,000 mcg by mouth daily.  Modified Medications   No medications on file  Discontinued Medications   NIACIN (SLO-NIACIN) 500 MG TABLET    Take 500 mg by mouth daily.       Physical Exam:  Filed Vitals:   06/14/15 1322  BP: 128/84  Pulse: 70  Temp: 97.3 F (36.3 C)  TempSrc: Oral  Resp: 16  Height: 5' 3.5" (1.613 m)  Weight: 152 lb 9.6 oz (69.219 kg)  SpO2: 98%    Physical Exam  Constitutional: She is oriented to person, place, and time. She appears well-developed and well-nourished. No distress.  HENT:  Head: Normocephalic and atraumatic.  Mouth/Throat: Oropharynx is clear and moist. No oropharyngeal exudate.  Eyes: Conjunctivae are normal. Pupils are equal, round, and reactive to light.  Neck: Normal range of motion. Neck supple.  Cardiovascular: Normal rate, regular rhythm and normal heart sounds.   Occasional PACs  Pulmonary/Chest: Effort normal and breath sounds normal.  Abdominal: Soft. Bowel sounds are normal.  Musculoskeletal: She exhibits no edema or tenderness.  Neurological: She is alert and oriented to person, place, and time.  Skin: Skin is warm and dry. She is not diaphoretic.  Psychiatric: She has a normal mood and affect.    Labs reviewed: Basic Metabolic Panel:  Recent Labs  09/16/14 02/03/15 1315  NA 140 135  K 4.2 3.9  CL  --  102  CO2  --  26  GLUCOSE  --  102*  BUN 17 17  CREATININE 0.7 1.14*  CALCIUM  --  9.7   Liver Function Tests:  Recent Labs  09/16/14 02/03/15 1315  AST 33 30  ALT 24 21  ALKPHOS 70 80  BILITOT  --  0.6  PROT  --  7.4  ALBUMIN  --  3.8   No results for input(s): LIPASE, AMYLASE in the last 8760 hours. No results for input(s): AMMONIA in the last  8760 hours. CBC:  Recent Labs  09/16/14 02/03/15 1315  WBC 6.8 7.4  NEUTROABS  --  5.3  HGB 11.3* 11.5*  HCT 36 35.3*  MCV  --  97.5  PLT 211 259   Lipid Panel: No results for input(s): CHOL, HDL, LDLCALC, TRIG, CHOLHDL, LDLDIRECT in the last 8760 hours. TSH: No results for input(s): TSH in the last 8760 hours. A1C: Lab Results  Component Value Date   HGBA1C 5.5 06/07/2008     Assessment/Plan 1. Palpitations -pt with hx of PVC, noted increased palpitations over the last 2 days  - EKG 12-Lead, unchanged with PACs -will follow up lab work - Comprehensive metabolic panel - CBC with Differential - TSH  2. Shortness of breath -pt maintaining oxygen, no changes in cough (hx of post nasal drip and lung cancer)  or congestion - DG Chest 2 View; Future for further evaluation along with lab   3. Constipation, unspecified constipation type -worsening constipation -educated to increase fluid intake -cont stool softener and fiber  - to add polyethylene glycol powder (GLYCOLAX/MIRALAX) powder; Take 17 g by mouth every other day.  Dispense: 3350 g; Refill: 1- hold for loose stool/diarrhea   -follow up precautions discussed  Makale Pindell K. Harle Battiest  [facility] [phone] 8 am - 5  pm) [phone] (after hours)

## 2015-06-15 LAB — COMPREHENSIVE METABOLIC PANEL
ALT: 16 IU/L (ref 0–32)
AST: 24 IU/L (ref 0–40)
Albumin/Globulin Ratio: 1.2 (ref 1.1–2.5)
Albumin: 4 g/dL (ref 3.2–4.6)
Alkaline Phosphatase: 107 IU/L (ref 39–117)
BILIRUBIN TOTAL: 0.2 mg/dL (ref 0.0–1.2)
BUN/Creatinine Ratio: 14 (ref 11–26)
BUN: 14 mg/dL (ref 10–36)
CALCIUM: 10.5 mg/dL — AB (ref 8.7–10.3)
CO2: 25 mmol/L (ref 18–29)
Chloride: 90 mmol/L — ABNORMAL LOW (ref 97–108)
Creatinine, Ser: 1.02 mg/dL — ABNORMAL HIGH (ref 0.57–1.00)
GFR calc non Af Amer: 48 mL/min/{1.73_m2} — ABNORMAL LOW (ref >59)
GFR, EST AFRICAN AMERICAN: 56 mL/min/{1.73_m2} — AB (ref >59)
GLUCOSE: 68 mg/dL (ref 65–99)
Globulin, Total: 3.4 g/dL (ref 1.5–4.5)
POTASSIUM: 4.6 mmol/L (ref 3.5–5.2)
SODIUM: 131 mmol/L — AB (ref 134–144)
Total Protein: 7.4 g/dL (ref 6.0–8.5)

## 2015-06-15 LAB — CBC WITH DIFFERENTIAL/PLATELET
BASOS ABS: 0 10*3/uL (ref 0.0–0.2)
BASOS: 0 %
EOS (ABSOLUTE): 0.1 10*3/uL (ref 0.0–0.4)
Eos: 1 %
Hematocrit: 36.9 % (ref 34.0–46.6)
Hemoglobin: 11.9 g/dL (ref 11.1–15.9)
IMMATURE GRANS (ABS): 0 10*3/uL (ref 0.0–0.1)
Immature Granulocytes: 0 %
LYMPHS ABS: 0.9 10*3/uL (ref 0.7–3.1)
Lymphs: 11 %
MCH: 30.8 pg (ref 26.6–33.0)
MCHC: 32.2 g/dL (ref 31.5–35.7)
MCV: 96 fL (ref 79–97)
MONOCYTES: 11 %
Monocytes Absolute: 0.9 10*3/uL (ref 0.1–0.9)
Neutrophils Absolute: 6.2 10*3/uL (ref 1.4–7.0)
Neutrophils: 77 %
PLATELETS: 297 10*3/uL (ref 150–379)
RBC: 3.86 x10E6/uL (ref 3.77–5.28)
RDW: 12.9 % (ref 12.3–15.4)
WBC: 8 10*3/uL (ref 3.4–10.8)

## 2015-06-15 LAB — TSH: TSH: 2.08 u[IU]/mL (ref 0.450–4.500)

## 2015-06-21 DIAGNOSIS — E871 Hypo-osmolality and hyponatremia: Secondary | ICD-10-CM | POA: Diagnosis not present

## 2015-06-21 DIAGNOSIS — I10 Essential (primary) hypertension: Secondary | ICD-10-CM | POA: Diagnosis not present

## 2015-06-21 LAB — BASIC METABOLIC PANEL
BUN: 17 mg/dL (ref 4–21)
CREATININE: 1.1 mg/dL (ref 0.5–1.1)
GLUCOSE: 80 mg/dL
POTASSIUM: 4.6 mmol/L (ref 3.4–5.3)
Sodium: 132 mmol/L — AB (ref 137–147)

## 2015-06-30 ENCOUNTER — Encounter: Payer: Self-pay | Admitting: Nurse Practitioner

## 2015-06-30 ENCOUNTER — Non-Acute Institutional Stay: Payer: Medicare Other | Admitting: Nurse Practitioner

## 2015-06-30 VITALS — BP 120/62 | HR 60 | Temp 97.4°F | Wt 153.0 lb

## 2015-06-30 DIAGNOSIS — E871 Hypo-osmolality and hyponatremia: Secondary | ICD-10-CM

## 2015-06-30 DIAGNOSIS — K5900 Constipation, unspecified: Secondary | ICD-10-CM | POA: Diagnosis not present

## 2015-06-30 DIAGNOSIS — D649 Anemia, unspecified: Secondary | ICD-10-CM | POA: Diagnosis not present

## 2015-06-30 DIAGNOSIS — K219 Gastro-esophageal reflux disease without esophagitis: Secondary | ICD-10-CM | POA: Diagnosis not present

## 2015-06-30 DIAGNOSIS — I10 Essential (primary) hypertension: Secondary | ICD-10-CM | POA: Diagnosis not present

## 2015-07-09 DIAGNOSIS — E871 Hypo-osmolality and hyponatremia: Secondary | ICD-10-CM | POA: Insufficient documentation

## 2015-07-09 NOTE — Assessment & Plan Note (Signed)
Controlled, continue Atenolol 12.44m daily

## 2015-07-09 NOTE — Assessment & Plan Note (Signed)
Off MiraLax, c/o constipation, will increase Colace to 124m bid and Fiber tabs daily. May resume MiraLax if no better.

## 2015-07-09 NOTE — Progress Notes (Signed)
Patient ID: [name], female   DOB: [birth year], 81 y.o.   MRN: [id number]  Location:  clinic [place] Provider:  Marlana Latus NP  Code Status:  DNR Goals of care: Advanced Directive information    Chief Complaint  Patient presents with  . Medical Management of Chronic Issues    follow-up on palpitations, SOB, constipation. Patient had been using Flonase 6 weeks prior to symptoms, clinic nurse read that it could cause brreathing problems. She stopped it and is better now.  . Nail Problem    left great toe nail is white, ? fungus. Was told to use Nail-Tek (anti Fungal)     HPI: Patient is a 81 y.o. female seen in the clinic at [facility] today for evaluation of resolved "breathing trouble", thickened left great toe nail, and blood pressure.   She has Hx of hyponatremia with serum Na 130s-last checked 132 06/21/15. Still c/o constipation while taking Colace I daily(she stopped taking MiraLax presently). No longer c/o nasal drainage even if after stopped Flonase due to its possible negative side effect of "breathing problem". Hx of anemia has been stable, last Hgb 11.9, takes Iron, vit B12, and Folate. Denied heartburns, indigestion, epigastric pain or discomfort.   Problem List Items Addressed This Visit    GERD (Chronic)    Has been stable, continue Omeprazole 75m daily.       Anemia    Last Hgb 11.9, unclear of etiology, takes Vit B12, Folate, and Iron-will not change tx since Hgb is satisfactory presently.       HTN (hypertension)    Controlled, continue Atenolol 12.518mdaily      Constipation    Off MiraLax, c/o constipation, will increase Colace to 10043mid and Fiber tabs daily. May resume MiraLax if no better.       Hyponatremia - Primary    06/21/15 Na 132(was 131 06/14/15), continue to monitor. Unclear etiology but stable so far.          Review of Systems:  Review of Systems  Constitutional: Negative for weight loss and malaise/fatigue.  HENT: Negative for  congestion and hearing loss.   Eyes: Negative.  Negative for double vision and discharge.  Respiratory: Negative for cough and shortness of breath.   Cardiovascular: Negative for chest pain, palpitations and leg swelling.  Gastrointestinal: Positive for constipation. Negative for heartburn, nausea, abdominal pain and diarrhea.  Genitourinary: Negative for dysuria and urgency.  Musculoskeletal: Negative for myalgias.  Skin: Negative for rash.  Neurological: Negative for dizziness, tremors, seizures and weakness.  Psychiatric/Behavioral: Negative for depression, hallucinations and substance abuse. The patient is not nervous/anxious.     Past Medical History  Diagnosis Date  . Lung cancer 05/2010    Left 2011 - rad x 3  . Rheumatic fever     Age [age] Pneumonia     RLL with sepsis  . Biceps tendon rupture     Bilateral  . Elevated liver enzymes     Biopsy consistent with steatohepatitis  . Right thyroid nodule   . Anxiety   . Lymphedema     Left arm  . Overactive bladder   . PSVT (paroxysmal supraventricular tachycardia)   . Osteoporosis, senile   . Osteoarthritis   . Low back pain     scoliosis  . Hyperlipidemia   . GERD (gastroesophageal reflux disease) 06/2007    EGD Dr RouGala [facility], multiple fundic gland polyps  . Type 2 diabetes mellitus   .  Anemia     NOS  . Allergic rhinitis   . Insomnia   . Diverticulosis   . Hemorrhoids   . Hiatal hernia   . Steatohepatitis     liver bx  . Fundic gland polyps of stomach, benign   . Hx of radiation therapy 07/17/10 to 07/21/10    SRS LLL lung  . Breast cancer 2001    Left s/p lumpectomy and XRT   . Depression   . Glaucoma 2013    both eyes  . Cough 2014  . Bronchiectasis 2014    Noted on chest x-ray    Patient Active Problem List   Diagnosis Date Noted  . Hyponatremia 07/09/2015  . Pruritus 10/26/2014  . Abdominal cramping, periumbilical 23/55/7322  . Hearing loss of both ears 04/08/2014  . HTN (hypertension)  03/27/2014  . Constipation 03/27/2014  . Skin lesion of face 03/27/2014  . Bronchiectasis   . Cough   . Rotator cuff syndrome of right shoulder 09/30/2012  . Breast cancer   . Hx of radiation therapy   . Lung cancer, lower lobe 10/25/2011  . PSVT (paroxysmal supraventricular tachycardia) 03/29/2011  . HIP PAIN 05/03/2010  . TOTAL HIP FOLLOW-UP 05/03/2010  . INTERDIGITAL NEUROMA 03/28/2010  . IMPINGEMENT SYNDROME 03/28/2010  . PAROXYSMAL SUPRAVENTRICULAR TACHYCARDIA 01/11/2010  . THYROID NODULE, RIGHT 09/06/2008  . HIP, ARTHRITIS, DEGEN./OSTEO 05/03/2008  . PALPITATIONS, RECURRENT 08/27/2007  . ANXIETY 07/31/2007  . WEAKNESS, MUSCLE 05/28/2007  . ADRENAL MASS, BILATERAL 05/14/2007  . DYSPNEA 05/06/2007  . FATIGUE 05/01/2007  . DIABETES MELLITUS, TYPE II 01/28/2007  . Hyperlipidemia 01/28/2007  . Anemia 01/28/2007  . ALLERGIC RHINITIS 01/28/2007  . GERD 01/28/2007  . OVERACTIVE BLADDER 01/28/2007  . Osteoarthritis 01/28/2007  . LOW BACK PAIN 01/28/2007  . Osteoporosis 01/28/2007    Allergies  Allergen Reactions  . Ibuprofen Hives  . Naproxen   . Statins Other (See Comments)    Feels like knives in stomach  . Surgilube [Gyne-Moistrin]     Rectal itching, burning  . Tape Rash    Medications: Patient's Medications  New Prescriptions   No medications on file  Previous Medications   ARTIFICIAL TEAR SOLUTION (TEARS NATURALE II OP)    Apply 1 drop to eye daily as needed.   ASPIRIN 81 MG EC TABLET    Take 81 mg by mouth daily.     ATENOLOL (TENORMIN) 25 MG TABLET    TAKE (1/2) TABLET DAILY FOR BLOOD PRESSURE.   BIOTIN 1000 MCG TABLET    Take 1,000 mcg by mouth every evening.    BRIMONIDINE (ALPHAGAN) 0.2 % OPHTHALMIC SOLUTION    Place 1 drop into both eyes. Once a day   CALCIUM CARBONATE-VITAMIN D (CALTRATE 600+D) 600-400 MG-UNIT PER TABLET    Take 1 tablet by mouth daily.     DOCUSATE CALCIUM (STOOL SOFTENER PO)    Take 1 capsule by mouth. Colace take one capsule in  evening   FIBER TABS    Take 4 tablets by mouth daily.   FOLIC ACID (FOLVITE) 025 MCG TABLET    Take 400 mcg by mouth daily.     IRON PO    Take 27 mg by mouth daily.   LATANOPROST (XALATAN) 0.005 % OPHTHALMIC SOLUTION    Place 1 drop into both eyes daily.    MULTIPLE VITAMIN (MULTIVITAMIN) CAPSULE    Take 1 capsule by mouth daily.    OMEPRAZOLE (PRILOSEC) 20 MG CAPSULE    TAKE 1 CAPSULE EVERY DAY.  PSEUDOEPHEDRINE-DM-GG (SUDAFED COUGH PO)    Take 1 tablet by mouth as needed. For post nasal drip   VITAMIN B-12 (CYANOCOBALAMIN) 1000 MCG TABLET    Take 1,000 mcg by mouth daily.  Modified Medications   No medications on file  Discontinued Medications   POLYETHYLENE GLYCOL POWDER (GLYCOLAX/MIRALAX) POWDER    Take 17 g by mouth every other day.    Physical Exam: Filed Vitals:   06/30/15 1418  BP: 120/62  Pulse: 60  Temp: 97.4 F (36.3 C)  TempSrc: Oral  Weight: 153 lb (69.4 kg)  SpO2: 97%   Body mass index is 26.67 kg/(m^2).  Physical Exam  Constitutional: She is oriented to person, place, and time. She appears well-developed and well-nourished. No distress.  HENT:  Head: Normocephalic and atraumatic.  Mouth/Throat: Oropharynx is clear and moist. No oropharyngeal exudate.  Eyes: Conjunctivae are normal. Pupils are equal, round, and reactive to light.  Neck: Normal range of motion. Neck supple.  Cardiovascular: Normal rate, regular rhythm and normal heart sounds.   Occasional PACs  Pulmonary/Chest: Effort normal and breath sounds normal. No respiratory distress. She has no wheezes. She has no rales.  Abdominal: Soft. Bowel sounds are normal.  Musculoskeletal: Normal range of motion. She exhibits no edema or tenderness.  Neurological: She is alert and oriented to person, place, and time.  Skin: Skin is warm and dry. She is not diaphoretic.  Psychiatric: She has a normal mood and affect.    Labs reviewed: Basic Metabolic Panel:  Recent Labs  02/03/15 1315 06/14/15 1413  06/21/15  NA 135 131* 132*  K 3.9 4.6 4.6  CL 102 90*  --   CO2 26 25  --   GLUCOSE 102* 68  --   BUN 17 14 17   CREATININE 1.14* 1.02* 1.1  CALCIUM 9.7 10.5*  --     Liver Function Tests:  Recent Labs  09/16/14 02/03/15 1315 06/14/15 1413  AST 33 30 24  ALT 24 21 16   ALKPHOS 70 80 107  BILITOT  --  0.6 0.2  PROT  --  7.4 7.4  ALBUMIN  --  3.8  --     CBC:  Recent Labs  09/16/14 02/03/15 1315 06/14/15 1413  WBC 6.8 7.4 8.0  NEUTROABS  --  5.3 6.2  HGB 11.3* 11.5*  --   HCT 36 35.3* 36.9  MCV  --  97.5  --   PLT 211 259  --     Lab Results  Component Value Date   TSH 2.080 06/14/2015   Lab Results  Component Value Date   HGBA1C 5.5 06/07/2008   Lab Results  Component Value Date   CHOL 199 07/23/2013   HDL 68 07/23/2013   LDLCALC 114 07/23/2013   TRIG 86 07/23/2013   CHOLHDL 3.0 Ratio 06/07/2008    Significant Diagnostic Results since last visit: none  Patient Care Team: Estill Dooms, MD as PCP - General (Internal Medicine) Lanelle Bal, MD as Referring Physician (Internal Medicine) Satira Sark, MD as Consulting Physician (Cardiology) Kyung Rudd, MD as Consulting Physician (Radiation Oncology)  Assessment/Plan There are no diagnoses linked to this encounter.   Family/ staff Communication: monitor for improvement of constipation, "breathing trouble"  Labs/tests ordered: none  [facility] Monna Crean NP Geriatrics [facility] [address], [place] [address] On Call:  [phone] & follow prompts after 5pm & weekends Office Phone:  [phone] Office Fax:  [phone]

## 2015-07-09 NOTE — Assessment & Plan Note (Addendum)
06/21/15 Na 132(was 131 06/14/15), continue to monitor. Unclear etiology but stable so far.

## 2015-07-09 NOTE — Assessment & Plan Note (Signed)
Has been stable, continue Omeprazole 70m daily.

## 2015-07-09 NOTE — Assessment & Plan Note (Signed)
Last Hgb 11.9, unclear of etiology, takes Vit B12, Folate, and Iron-will not change tx since Hgb is satisfactory presently.

## 2015-07-11 ENCOUNTER — Other Ambulatory Visit: Payer: Self-pay | Admitting: Internal Medicine

## 2015-09-06 DIAGNOSIS — H3530 Unspecified macular degeneration: Secondary | ICD-10-CM | POA: Diagnosis not present

## 2015-09-06 DIAGNOSIS — H31322 Choroidal rupture, left eye: Secondary | ICD-10-CM | POA: Diagnosis not present

## 2015-09-06 DIAGNOSIS — H4011X3 Primary open-angle glaucoma, severe stage: Secondary | ICD-10-CM | POA: Diagnosis not present

## 2015-09-06 DIAGNOSIS — D3132 Benign neoplasm of left choroid: Secondary | ICD-10-CM | POA: Diagnosis not present

## 2015-09-15 DIAGNOSIS — Z23 Encounter for immunization: Secondary | ICD-10-CM | POA: Diagnosis not present

## 2015-10-17 ENCOUNTER — Other Ambulatory Visit: Payer: Self-pay | Admitting: Internal Medicine

## 2015-10-19 DIAGNOSIS — R2681 Unsteadiness on feet: Secondary | ICD-10-CM | POA: Diagnosis not present

## 2015-10-19 DIAGNOSIS — M545 Low back pain: Secondary | ICD-10-CM | POA: Diagnosis not present

## 2015-10-20 DIAGNOSIS — C3490 Malignant neoplasm of unspecified part of unspecified bronchus or lung: Secondary | ICD-10-CM | POA: Diagnosis not present

## 2015-10-20 DIAGNOSIS — D649 Anemia, unspecified: Secondary | ICD-10-CM | POA: Diagnosis not present

## 2015-10-20 DIAGNOSIS — E785 Hyperlipidemia, unspecified: Secondary | ICD-10-CM | POA: Diagnosis not present

## 2015-10-20 DIAGNOSIS — E041 Nontoxic single thyroid nodule: Secondary | ICD-10-CM | POA: Diagnosis not present

## 2015-10-20 DIAGNOSIS — E119 Type 2 diabetes mellitus without complications: Secondary | ICD-10-CM | POA: Diagnosis not present

## 2015-10-20 LAB — LIPID PANEL
CHOLESTEROL: 205 mg/dL — AB (ref 0–200)
HDL: 73 mg/dL — AB (ref 35–70)
LDL CALC: 114 mg/dL
Triglycerides: 91 mg/dL (ref 40–160)

## 2015-10-20 LAB — BASIC METABOLIC PANEL
BUN: 15 mg/dL (ref 4–21)
Creatinine: 1 mg/dL (ref 0.5–1.1)
Glucose: 69 mg/dL
Potassium: 4.3 mmol/L (ref 3.4–5.3)
SODIUM: 134 mmol/L — AB (ref 137–147)

## 2015-10-20 LAB — HEPATIC FUNCTION PANEL
ALK PHOS: 87 U/L (ref 25–125)
ALT: 16 U/L (ref 7–35)
AST: 22 U/L (ref 13–35)
Bilirubin, Total: 0.5 mg/dL

## 2015-10-20 LAB — CBC AND DIFFERENTIAL
HCT: 36 % (ref 36–46)
Hemoglobin: 11.8 g/dL — AB (ref 12.0–16.0)
PLATELETS: 288 10*3/uL (ref 150–399)
WBC: 6 10^3/mL

## 2015-10-21 ENCOUNTER — Encounter: Payer: Self-pay | Admitting: *Deleted

## 2015-11-01 DIAGNOSIS — M545 Low back pain: Secondary | ICD-10-CM | POA: Diagnosis not present

## 2015-11-01 DIAGNOSIS — R2681 Unsteadiness on feet: Secondary | ICD-10-CM | POA: Diagnosis not present

## 2015-11-03 ENCOUNTER — Non-Acute Institutional Stay: Payer: Medicare Other | Admitting: Nurse Practitioner

## 2015-11-03 ENCOUNTER — Encounter: Payer: Self-pay | Admitting: Nurse Practitioner

## 2015-11-03 VITALS — BP 122/62 | HR 69 | Temp 97.6°F | Wt 153.0 lb

## 2015-11-03 DIAGNOSIS — M25551 Pain in right hip: Secondary | ICD-10-CM | POA: Diagnosis not present

## 2015-11-03 DIAGNOSIS — M545 Low back pain: Secondary | ICD-10-CM | POA: Diagnosis not present

## 2015-11-03 DIAGNOSIS — D649 Anemia, unspecified: Secondary | ICD-10-CM

## 2015-11-03 DIAGNOSIS — K5900 Constipation, unspecified: Secondary | ICD-10-CM | POA: Diagnosis not present

## 2015-11-03 DIAGNOSIS — E785 Hyperlipidemia, unspecified: Secondary | ICD-10-CM

## 2015-11-03 DIAGNOSIS — K219 Gastro-esophageal reflux disease without esophagitis: Secondary | ICD-10-CM | POA: Diagnosis not present

## 2015-11-03 DIAGNOSIS — E871 Hypo-osmolality and hyponatremia: Secondary | ICD-10-CM

## 2015-11-03 DIAGNOSIS — I10 Essential (primary) hypertension: Secondary | ICD-10-CM | POA: Diagnosis not present

## 2015-11-03 DIAGNOSIS — R2681 Unsteadiness on feet: Secondary | ICD-10-CM | POA: Diagnosis not present

## 2015-11-03 NOTE — Assessment & Plan Note (Signed)
Controlled, continue Atenolol 12.24m daily

## 2015-11-03 NOTE — Assessment & Plan Note (Addendum)
Last Hgb 11.8 10/20/15, unclear of etiology, takes Vit B12, Folate, and Iron

## 2015-11-03 NOTE — Assessment & Plan Note (Signed)
10/20/15 LDL 114, off niacin-itching

## 2015-11-03 NOTE — Progress Notes (Signed)
Patient ID: [name], female   DOB: 1921/02/26, 81 y.o.   MRN: [id number]  Location:  clinic [place] Provider:  Marlana Latus NP  Code Status:  DNR Goals of care: Advanced Directive information Does patient have an advance directive?: Yes, Type of Advance Directive: Living will  Chief Complaint  Patient presents with  . Medical Management of Chronic Issues    blood pressure, blood sugar, thyroid, anemia, hyponatremia     HPI: Patient is a 81 y.o. female seen in the clinic at [facility] today for evaluation of Hx of hyponatremia with serum Na 130s-last checked 134 10/20/15. Hx of anemia has been stable, last Hgb 11.8 10/20/15 takes Iron, vit B12, and Folate. Denied heartburns, indigestion, epigastric pain or discomfort.   Review of Systems  Constitutional: Negative for weight loss and malaise/fatigue.  HENT: Negative for congestion and hearing loss.   Eyes: Negative.  Negative for double vision and discharge.  Respiratory: Negative for cough and shortness of breath.   Cardiovascular: Negative for chest pain, palpitations and leg swelling.  Gastrointestinal: Positive for constipation. Negative for heartburn, nausea, abdominal pain and diarrhea.  Genitourinary: Negative for dysuria and urgency.  Musculoskeletal: Negative for myalgias.  Skin: Negative for rash.  Neurological: Negative for dizziness, tremors, seizures and weakness.  Psychiatric/Behavioral: Negative for depression, hallucinations and substance abuse. The patient is not nervous/anxious.     Past Medical History  Diagnosis Date  . Lung cancer ([place]) 05/2010    Left 2011 - rad x 3  . Rheumatic fever     Age 44  . Pneumonia     RLL with sepsis  . Biceps tendon rupture     Bilateral  . Elevated liver enzymes     Biopsy consistent with steatohepatitis  . Right thyroid nodule   . Anxiety   . Lymphedema     Left arm  . Overactive bladder   . PSVT (paroxysmal supraventricular tachycardia) ([place])   . Osteoporosis,  senile   . Osteoarthritis   . Low back pain     scoliosis  . Hyperlipidemia   . GERD (gastroesophageal reflux disease) 06/2007    EGD Dr Gala Romney >sm HH, multiple fundic gland polyps  . Type 2 diabetes mellitus ([place])   . Anemia     NOS  . Allergic rhinitis   . Insomnia   . Diverticulosis   . Hemorrhoids   . Hiatal hernia   . Steatohepatitis     liver bx  . Fundic gland polyps of stomach, benign   . Hx of radiation therapy 07/17/10 to 07/21/10    SRS LLL lung  . Breast cancer ([place]) 2001    Left s/p lumpectomy and XRT   . Depression   . Glaucoma 2013    both eyes  . Cough 2014  . Bronchiectasis ([place]) 2014    Noted on chest x-ray    Patient Active Problem List   Diagnosis Date Noted  . Hyponatremia 07/09/2015  . Pruritus 10/26/2014  . Abdominal cramping, periumbilical 65/02/5464  . Hearing loss of both ears 04/08/2014  . HTN (hypertension) 03/27/2014  . Constipation 03/27/2014  . Skin lesion of face 03/27/2014  . Bronchiectasis ([place])   . Cough   . Rotator cuff syndrome of right shoulder 09/30/2012  . Breast cancer ([place])   . Hx of radiation therapy   . Lung cancer, lower lobe ([place]) 10/25/2011  . PSVT (paroxysmal supraventricular tachycardia) ([place]) 03/29/2011  . HIP PAIN 05/03/2010  . TOTAL HIP FOLLOW-UP  05/03/2010  . INTERDIGITAL NEUROMA 03/28/2010  . IMPINGEMENT SYNDROME 03/28/2010  . PAROXYSMAL SUPRAVENTRICULAR TACHYCARDIA 01/11/2010  . THYROID NODULE, RIGHT 09/06/2008  . HIP, ARTHRITIS, DEGEN./OSTEO 05/03/2008  . PALPITATIONS, RECURRENT 08/27/2007  . ANXIETY 07/31/2007  . WEAKNESS, MUSCLE 05/28/2007  . ADRENAL MASS, BILATERAL 05/14/2007  . DYSPNEA 05/06/2007  . FATIGUE 05/01/2007  . DIABETES MELLITUS, TYPE II 01/28/2007  . Hyperlipidemia 01/28/2007  . Anemia 01/28/2007  . ALLERGIC RHINITIS 01/28/2007  . GERD 01/28/2007  . OVERACTIVE BLADDER 01/28/2007  . Osteoarthritis 01/28/2007  . LOW BACK PAIN 01/28/2007  . Osteoporosis 01/28/2007    Allergies    Allergen Reactions  . Ibuprofen Hives  . Naproxen   . Statins Other (See Comments)    Feels like knives in stomach  . Surgilube [Gyne-Moistrin]     Rectal itching, burning  . Tape Rash    Medications: Patient's Medications  New Prescriptions   No medications on file  Previous Medications   ARTIFICIAL TEAR SOLUTION (TEARS NATURALE II OP)    Apply 1 drop to eye daily as needed.   ASPIRIN 81 MG EC TABLET    Take 81 mg by mouth daily.     ATENOLOL (TENORMIN) 25 MG TABLET    TAKE (1/2) TABLET DAILY FOR BLOOD PRESSURE.   BIOTIN 1000 MCG TABLET    Take 1,000 mcg by mouth every evening.    BRIMONIDINE (ALPHAGAN) 0.2 % OPHTHALMIC SOLUTION    Place 1 drop into both eyes. Once a day   CALCIUM CARBONATE-VITAMIN D (CALTRATE 600+D) 600-400 MG-UNIT PER TABLET    Take 1 tablet by mouth daily.     DOCUSATE CALCIUM (STOOL SOFTENER PO)    Take 1 capsule by mouth. Colace take one capsule in morning and two  in evening   FIBER TABS    Take 4 tablets by mouth daily.   FOLIC ACID (FOLVITE) 536 MCG TABLET    Take 400 mcg by mouth daily.     IRON PO    Take 27 mg by mouth daily.   LATANOPROST (XALATAN) 0.005 % OPHTHALMIC SOLUTION    Place 1 drop into both eyes daily.    MULTIPLE VITAMIN (MULTIVITAMIN) CAPSULE    Take 1 capsule by mouth daily.    OMEPRAZOLE (PRILOSEC) 20 MG CAPSULE    TAKE 1 CAPSULE EVERY DAY.   PSEUDOEPHEDRINE-DM-GG (SUDAFED COUGH PO)    Take 1 tablet by mouth as needed. For post nasal drip   VITAMIN B-12 (CYANOCOBALAMIN) 1000 MCG TABLET    Take 1,000 mcg by mouth daily.  Modified Medications   No medications on file  Discontinued Medications   No medications on file    Physical Exam: Filed Vitals:   11/03/15 1403  BP: 122/62  Pulse: 69  Temp: 97.6 F (36.4 C)  TempSrc: Oral  Weight: 153 lb (69.4 kg)  SpO2: 97%   Body mass index is 26.67 kg/(m^2).  Physical Exam  Constitutional: She is oriented to person, place, and time. She appears well-developed and well-nourished. No  distress.  HENT:  Head: Normocephalic and atraumatic.  Mouth/Throat: Oropharynx is clear and moist. No oropharyngeal exudate.  Eyes: Conjunctivae are normal. Pupils are equal, round, and reactive to light.  Neck: Normal range of motion. Neck supple.  Cardiovascular: Normal rate, regular rhythm and normal heart sounds.   Occasional PACs  Pulmonary/Chest: Effort normal and breath sounds normal. No respiratory distress. She has no wheezes. She has no rales.  Abdominal: Soft. Bowel sounds are normal.  Musculoskeletal: Normal range  of motion. She exhibits no edema or tenderness.  Neurological: She is alert and oriented to person, place, and time.  Skin: Skin is warm and dry. She is not diaphoretic.  Psychiatric: She has a normal mood and affect.    Labs reviewed: Basic Metabolic Panel:  Recent Labs  02/03/15 1315 06/14/15 1413 06/21/15 10/20/15  NA 135 131* 132* 134*  K 3.9 4.6 4.6 4.3  CL 102 90*  --   --   CO2 26 25  --   --   GLUCOSE 102* 68  --   --   BUN 17 14 17 15   CREATININE 1.14* 1.02* 1.1 1.0  CALCIUM 9.7 10.5*  --   --     Liver Function Tests:  Recent Labs  02/03/15 1315 06/14/15 1413 10/20/15  AST 30 24 22   ALT 21 16 16   ALKPHOS 80 107 87  BILITOT 0.6 0.2  --   PROT 7.4 7.4  --   ALBUMIN 3.8 4.0  --     CBC:  Recent Labs  02/03/15 1315 06/14/15 1413 10/20/15  WBC 7.4 8.0 6.0  NEUTROABS 5.3 6.2  --   HGB 11.5*  --  11.8*  HCT 35.3* 36.9 36  MCV 97.5  --   --   PLT 259  --  288    Lab Results  Component Value Date   TSH 2.080 06/14/2015   Lab Results  Component Value Date   HGBA1C 5.5 06/07/2008   Lab Results  Component Value Date   CHOL 205* 10/20/2015   HDL 73* 10/20/2015   LDLCALC 114 10/20/2015   TRIG 91 10/20/2015   CHOLHDL 3.0 Ratio 06/07/2008    Significant Diagnostic Results since last visit: none  Patient Care Team: Estill Dooms, MD as PCP - General (Internal Medicine) Lanelle Bal, MD as Referring Physician  (Internal Medicine) Satira Sark, MD as Consulting Physician (Cardiology) Kyung Rudd, MD as Consulting Physician (Radiation Oncology)  Assessment/Plan Problem List Items Addressed This Visit    Hyperlipidemia (Chronic)    10/20/15 LDL 114, off niacin-itching       GERD - Primary (Chronic)    Has been stable, continue Omeprazole 48m daily.       Anemia    Last Hgb 11.8 10/20/15, unclear of etiology, takes Vit B12, Folate, and Iron      HIP PAIN    S/p R+L hip replacements, now pain in the right hip region, has f/u appoint with Ortho, walker for short distance and motorized w/c to go further.       HTN (hypertension)    Controlled, continue Atenolol 12.526mdaily      Constipation    Stable, continue Colace10018mid and Fiber tabs daily.       Hyponatremia    06/21/15 Na 132 10/20/15 Na 134          Family/ staff Communication: continue IL  Labs/tests ordered: none  ManXie Naiara Lombardozzi NP Geriatrics [facility] [address] [id number] Call:  [phone] prompts after 5pm & weekends Office Phone:  [phone] Fax:  [phone]

## 2015-11-03 NOTE — Assessment & Plan Note (Signed)
06/21/15 Na 132 10/20/15 Na 134

## 2015-11-03 NOTE — Assessment & Plan Note (Signed)
Has been stable, continue Omeprazole 79m daily.

## 2015-11-03 NOTE — Assessment & Plan Note (Signed)
S/p R+L hip replacements, now pain in the right hip region, has f/u appoint with Ortho, walker for short distance and motorized w/c to go further.

## 2015-11-03 NOTE — Assessment & Plan Note (Signed)
Stable, continue Colace12m bid and Fiber tabs daily.

## 2015-11-09 DIAGNOSIS — R2681 Unsteadiness on feet: Secondary | ICD-10-CM | POA: Diagnosis not present

## 2015-11-09 DIAGNOSIS — M545 Low back pain: Secondary | ICD-10-CM | POA: Diagnosis not present

## 2015-11-11 DIAGNOSIS — M545 Low back pain: Secondary | ICD-10-CM | POA: Diagnosis not present

## 2015-11-11 DIAGNOSIS — R2681 Unsteadiness on feet: Secondary | ICD-10-CM | POA: Diagnosis not present

## 2015-11-16 DIAGNOSIS — M545 Low back pain: Secondary | ICD-10-CM | POA: Diagnosis not present

## 2015-11-16 DIAGNOSIS — R2681 Unsteadiness on feet: Secondary | ICD-10-CM | POA: Diagnosis not present

## 2015-11-17 DIAGNOSIS — M6281 Muscle weakness (generalized): Secondary | ICD-10-CM | POA: Diagnosis not present

## 2015-11-17 DIAGNOSIS — R2681 Unsteadiness on feet: Secondary | ICD-10-CM | POA: Diagnosis not present

## 2015-11-18 DIAGNOSIS — M6281 Muscle weakness (generalized): Secondary | ICD-10-CM | POA: Diagnosis not present

## 2015-11-18 DIAGNOSIS — R2681 Unsteadiness on feet: Secondary | ICD-10-CM | POA: Diagnosis not present

## 2015-11-22 DIAGNOSIS — M6281 Muscle weakness (generalized): Secondary | ICD-10-CM | POA: Diagnosis not present

## 2015-11-22 DIAGNOSIS — R2681 Unsteadiness on feet: Secondary | ICD-10-CM | POA: Diagnosis not present

## 2015-11-23 DIAGNOSIS — R2681 Unsteadiness on feet: Secondary | ICD-10-CM | POA: Diagnosis not present

## 2015-11-23 DIAGNOSIS — M6281 Muscle weakness (generalized): Secondary | ICD-10-CM | POA: Diagnosis not present

## 2015-11-25 DIAGNOSIS — M6281 Muscle weakness (generalized): Secondary | ICD-10-CM | POA: Diagnosis not present

## 2015-11-25 DIAGNOSIS — R2681 Unsteadiness on feet: Secondary | ICD-10-CM | POA: Diagnosis not present

## 2015-11-29 DIAGNOSIS — M6281 Muscle weakness (generalized): Secondary | ICD-10-CM | POA: Diagnosis not present

## 2015-11-29 DIAGNOSIS — R2681 Unsteadiness on feet: Secondary | ICD-10-CM | POA: Diagnosis not present

## 2015-11-30 DIAGNOSIS — M6281 Muscle weakness (generalized): Secondary | ICD-10-CM | POA: Diagnosis not present

## 2015-11-30 DIAGNOSIS — R2681 Unsteadiness on feet: Secondary | ICD-10-CM | POA: Diagnosis not present

## 2015-12-02 DIAGNOSIS — M6281 Muscle weakness (generalized): Secondary | ICD-10-CM | POA: Diagnosis not present

## 2015-12-02 DIAGNOSIS — R2681 Unsteadiness on feet: Secondary | ICD-10-CM | POA: Diagnosis not present

## 2015-12-13 ENCOUNTER — Ambulatory Visit (INDEPENDENT_AMBULATORY_CARE_PROVIDER_SITE_OTHER): Payer: Medicare Other

## 2015-12-13 ENCOUNTER — Ambulatory Visit (INDEPENDENT_AMBULATORY_CARE_PROVIDER_SITE_OTHER): Payer: Medicare Other | Admitting: Orthopedic Surgery

## 2015-12-13 ENCOUNTER — Encounter: Payer: Self-pay | Admitting: Orthopedic Surgery

## 2015-12-13 VITALS — BP 155/85 | Ht 63.5 in | Wt 152.0 lb

## 2015-12-13 DIAGNOSIS — M25551 Pain in right hip: Secondary | ICD-10-CM

## 2015-12-13 NOTE — Progress Notes (Signed)
Chief Complaint  Patient presents with  . Hip Pain    right hip pain, hx of replacement 2005    The patient had a right total hip replacement elsewhere I did a left hip replacement on her she comes in wanting her right hip replacement checked secondary to several month history of pain over the right hip mildly severe worse with sitting and standing in one place for long periods of time.  She denies bowel or bladder dysfunction  Past Medical History  Diagnosis Date  . Lung cancer ([place]) 05/2010    Left 2011 - rad x 3  . Rheumatic fever     Age 81  . Pneumonia     RLL with sepsis  . Biceps tendon rupture     Bilateral  . Elevated liver enzymes     Biopsy consistent with steatohepatitis  . Right thyroid nodule   . Anxiety   . Lymphedema     Left arm  . Overactive bladder   . PSVT (paroxysmal supraventricular tachycardia) ([place])   . Osteoporosis, senile   . Osteoarthritis   . Low back pain     scoliosis  . Hyperlipidemia   . GERD (gastroesophageal reflux disease) 06/2007    EGD Dr Gala Romney >sm HH, multiple fundic gland polyps  . Type 2 diabetes mellitus ([place])   . Anemia     NOS  . Allergic rhinitis   . Insomnia   . Diverticulosis   . Hemorrhoids   . Hiatal hernia   . Steatohepatitis     liver bx  . Fundic gland polyps of stomach, benign   . Hx of radiation therapy 07/17/10 to 07/21/10    SRS LLL lung  . Breast cancer ([place]) 2001    Left s/p lumpectomy and XRT   . Depression   . Glaucoma 2013    both eyes  . Cough 2014  . Bronchiectasis ([place]) 2014    Noted on chest x-ray    BP 155/85 mmHg  Ht 5' 3.5" (1.613 m)  Wt 152 lb (68.947 kg)  BMI 26.50 kg/m2  Inspection of the right and left lower extremity reveal no leg length abnormalities. She has no tenderness over either hip  Both incisions healed nicely.  The flexion arc of the hip is 120 bilaterally. Motor function intact with hip flexor strength grade 5 bilaterally. Both hips are stable. Neurovascular exam is normal  in both lower extremities.  X-ray of the right hip shows a cerclage wire but stable hip replacement with a slightly vertical cup which does have a screw in place  There is no evidence of loosening  Impression pain most likely coming from her degenerative disc condition  Recommend no follow-up

## 2016-01-24 DIAGNOSIS — C44319 Basal cell carcinoma of skin of other parts of face: Secondary | ICD-10-CM | POA: Diagnosis not present

## 2016-01-24 DIAGNOSIS — C4441 Basal cell carcinoma of skin of scalp and neck: Secondary | ICD-10-CM | POA: Diagnosis not present

## 2016-01-24 DIAGNOSIS — Z85828 Personal history of other malignant neoplasm of skin: Secondary | ICD-10-CM | POA: Diagnosis not present

## 2016-01-24 DIAGNOSIS — C4401 Basal cell carcinoma of skin of lip: Secondary | ICD-10-CM | POA: Diagnosis not present

## 2016-01-24 DIAGNOSIS — L821 Other seborrheic keratosis: Secondary | ICD-10-CM | POA: Diagnosis not present

## 2016-01-24 DIAGNOSIS — L570 Actinic keratosis: Secondary | ICD-10-CM | POA: Diagnosis not present

## 2016-02-08 DIAGNOSIS — C4441 Basal cell carcinoma of skin of scalp and neck: Secondary | ICD-10-CM | POA: Diagnosis not present

## 2016-02-08 DIAGNOSIS — Z85828 Personal history of other malignant neoplasm of skin: Secondary | ICD-10-CM | POA: Diagnosis not present

## 2016-02-24 ENCOUNTER — Encounter: Payer: Self-pay | Admitting: *Deleted

## 2016-02-28 ENCOUNTER — Encounter: Payer: Self-pay | Admitting: Nurse Practitioner

## 2016-03-12 DIAGNOSIS — H31322 Choroidal rupture, left eye: Secondary | ICD-10-CM | POA: Diagnosis not present

## 2016-03-12 DIAGNOSIS — H401131 Primary open-angle glaucoma, bilateral, mild stage: Secondary | ICD-10-CM | POA: Diagnosis not present

## 2016-04-12 ENCOUNTER — Other Ambulatory Visit: Payer: Self-pay | Admitting: Internal Medicine

## 2016-04-12 ENCOUNTER — Encounter: Payer: Self-pay | Admitting: Internal Medicine

## 2016-04-12 DIAGNOSIS — D649 Anemia, unspecified: Secondary | ICD-10-CM | POA: Diagnosis not present

## 2016-04-12 DIAGNOSIS — E119 Type 2 diabetes mellitus without complications: Secondary | ICD-10-CM | POA: Diagnosis not present

## 2016-04-12 DIAGNOSIS — E785 Hyperlipidemia, unspecified: Secondary | ICD-10-CM | POA: Diagnosis not present

## 2016-04-12 DIAGNOSIS — E041 Nontoxic single thyroid nodule: Secondary | ICD-10-CM | POA: Diagnosis not present

## 2016-04-12 LAB — BASIC METABOLIC PANEL
BUN: 17 mg/dL (ref 4–21)
Creatinine: 1.1 mg/dL (ref <=1.1)
GLUCOSE: 71 mg/dL
Potassium: 4.8 mmol/L (ref 3.4–5.3)
SODIUM: 129 mmol/L — AB (ref 137–147)

## 2016-04-12 LAB — CBC AND DIFFERENTIAL
HEMATOCRIT: 34 % — AB (ref 36–46)
HEMOGLOBIN: 11.3 g/dL — AB (ref 12.0–16.0)
PLATELETS: 308 10*3/uL (ref 150–399)
WBC: 5.4 10^3/mL

## 2016-04-12 LAB — HEPATIC FUNCTION PANEL
ALK PHOS: 84 U/L (ref 25–125)
ALT: 16 U/L (ref 7–35)
AST: 24 U/L (ref 13–35)
Bilirubin, Total: 0.4 mg/dL

## 2016-04-12 LAB — LIPID PANEL
CHOLESTEROL: 188 mg/dL (ref 0–200)
HDL: 62 mg/dL (ref 35–70)
LDL CALC: 102 mg/dL
LDl/HDL Ratio: 3
Triglycerides: 122 mg/dL (ref 40–160)

## 2016-04-12 LAB — TSH: TSH: 3.51 u[IU]/mL (ref <=5.90)

## 2016-04-19 ENCOUNTER — Non-Acute Institutional Stay: Payer: Medicare Other | Admitting: Nurse Practitioner

## 2016-04-19 ENCOUNTER — Encounter: Payer: Self-pay | Admitting: Nurse Practitioner

## 2016-04-19 VITALS — BP 154/64 | HR 66 | Temp 97.5°F | Ht 63.5 in | Wt 151.6 lb

## 2016-04-19 DIAGNOSIS — F411 Generalized anxiety disorder: Secondary | ICD-10-CM

## 2016-04-19 DIAGNOSIS — K5900 Constipation, unspecified: Secondary | ICD-10-CM

## 2016-04-19 DIAGNOSIS — L299 Pruritus, unspecified: Secondary | ICD-10-CM

## 2016-04-19 DIAGNOSIS — E871 Hypo-osmolality and hyponatremia: Secondary | ICD-10-CM

## 2016-04-19 DIAGNOSIS — K219 Gastro-esophageal reflux disease without esophagitis: Secondary | ICD-10-CM

## 2016-04-19 DIAGNOSIS — D631 Anemia in chronic kidney disease: Secondary | ICD-10-CM

## 2016-04-19 DIAGNOSIS — N189 Chronic kidney disease, unspecified: Secondary | ICD-10-CM

## 2016-04-19 DIAGNOSIS — I10 Essential (primary) hypertension: Secondary | ICD-10-CM | POA: Diagnosis not present

## 2016-04-20 ENCOUNTER — Ambulatory Visit (INDEPENDENT_AMBULATORY_CARE_PROVIDER_SITE_OTHER): Payer: Medicare Other

## 2016-04-20 ENCOUNTER — Ambulatory Visit (INDEPENDENT_AMBULATORY_CARE_PROVIDER_SITE_OTHER): Payer: Medicare Other | Admitting: Physician Assistant

## 2016-04-20 VITALS — BP 124/72 | HR 77 | Temp 98.7°F | Resp 18 | Ht 63.0 in | Wt 148.0 lb

## 2016-04-20 DIAGNOSIS — S93402A Sprain of unspecified ligament of left ankle, initial encounter: Secondary | ICD-10-CM

## 2016-04-20 DIAGNOSIS — M79672 Pain in left foot: Secondary | ICD-10-CM | POA: Diagnosis not present

## 2016-04-20 DIAGNOSIS — M25572 Pain in left ankle and joints of left foot: Secondary | ICD-10-CM | POA: Diagnosis not present

## 2016-04-20 NOTE — Assessment & Plan Note (Signed)
Better symptomatic controlled on Claritin 86m qod

## 2016-04-20 NOTE — Progress Notes (Signed)
Patient ID: [name], female   DOB: Jan 03, 1921, 82 y.o.   MRN: [id number]   Location:  [facility] (12) Provider: Marlana Latus NP  Code Status: DNR Goals of Care:  Advanced Directives 11/03/2015  Does patient have an advance directive? Yes  Type of Advance Directive Living will  Copy of advanced directive(s) in chart? Yes     Chief Complaint  Patient presents with  . Annual Exam    complete physical    HPI: Patient is a 82 y.o. female seen today to evaluate chronic medical conditions. Hx of hyponatremia, 04/12/16 129. Blood pressure is controlled while on Atenolol 12.25m daily. Takes Vit B12 11540GQQdaily, folic acid 4761PJKdaily, Hgb 11.3 04/12/16. Constipation, managed with Fiber II daily, Colace 2554mbid, still had hard stools. Still functioning well in [place].   Past Medical History  Diagnosis Date  . Lung cancer ([facility]    Left 2011 - rad x 3  . Rheumatic fever     Age [age]. Pneumonia     RLL with sepsis  . Biceps tendon rupture     Bilateral  . Elevated liver enzymes     Biopsy consistent with steatohepatitis  . Right thyroid nodule   . Anxiety   . Lymphedema     Left arm  . Overactive bladder   . PSVT (paroxysmal supraventricular tachycardia) ([facility]  . Osteoporosis, senile   . Osteoarthritis   . Low back pain     scoliosis  . Hyperlipidemia   . GERD (gastroesophageal reflux disease) 06/2007    EGD Dr RoGala Romneysm [facility], multiple fundic gland polyps  . Type 2 diabetes mellitus ([facility]  . Anemia     NOS  . Allergic rhinitis   . Insomnia   . Diverticulosis   . Hemorrhoids   . Hiatal hernia   . Steatohepatitis     liver bx  . Fundic gland polyps of stomach, benign   . Hx of radiation therapy 07/17/10 to 07/21/10    SRS LLL lung  . Breast cancer ([facility]    Left s/p lumpectomy and XRT   . Depression   . Glaucoma 2013    both eyes  . Cough 2014  . Bronchiectasis ([facility]    Noted on chest x-ray    Past  Surgical History  Procedure Laterality Date  . Cataract extraction Bilateral 2003    Lens implant Dr. EpCharise Killian. Cholecystectomy  1965  . Total hip arthroplasty Bilateral 2005 & 2007     Dr. BlEarl LagosHaAline Brochure . Abdominal hysterectomy  1964  . Breast lumpectomy  2001  . Cystoscopy  2007  . Breast biopsy      X 3 w/ cystectomy  . Revision total hip arthroplasty  2007    Left  . Liver biopsy  2008  . Colonoscopy  09/11/2011    Procedure: COLONOSCOPY;  Surgeon: SaDorothyann PengMD;  [facility];  Service: Endoscopy;  Laterality: N/A;  . Esophagogastroduodenoscopy  09/11/2011    Procedure: ESOPHAGOGASTRODUODENOSCOPY (EGD);  Surgeon: SaDorothyann PengMD;  [facility];  Service: Endoscopy;  Laterality: N/A;  . Colonoscopy  2005 /2012    Dr. ReLucianne Mussdiverticulosis, ext hemorrhoids.  . Tonsillectomy  1930  . Appendectomy  1964  . Biopsy thyroid  11/2008  . Lung biopsy Left 06/06/2010  . Skin cancer excision  nose and left lower cheek    Allergies  Allergen Reactions  . Ibuprofen Hives  . Naproxen   . Statins Other (See Comments)    Feels like knives in stomach  . Surgilube [Gyne-Moistrin]     Rectal itching, burning  . Tape Rash      Medication List       This list is accurate as of: 04/19/16 11:59 PM.  Always use your most recent med list.               ALLERGY RELIEF 10 MG tablet  Generic drug:  loratadine  Take 10 mg by mouth every other day.     aspirin 81 MG EC tablet  Take 81 mg by mouth daily.     atenolol 25 MG tablet  Commonly known as:  TENORMIN  TAKE (1/2) TABLET DAILY FOR BLOOD PRESSURE.     Biotin 1000 MCG tablet  Take 1,000 mcg by mouth every evening.     brimonidine 0.2 % ophthalmic solution  Commonly known as:  ALPHAGAN  Place 1 drop into both eyes. Once a day     CALTRATE 600+D 600-400 MG-UNIT tablet  Generic drug:  Calcium Carbonate-Vitamin D  Take 1 tablet by mouth daily.     Fiber Tabs  Take 2 tablets by mouth  daily.     folic acid 314 MCG tablet  Commonly known as:  FOLVITE  Take 400 mcg by mouth daily.     IRON PO  Take 65 mg by mouth daily.     latanoprost 0.005 % ophthalmic solution  Commonly known as:  XALATAN  Place 1 drop into both eyes daily.     multivitamin capsule  Take 1 capsule by mouth daily.     omeprazole 20 MG capsule  Commonly known as:  PRILOSEC  TAKE 1 CAPSULE EVERY DAY.     STOOL SOFTENER PO  Take 1 capsule by mouth. Colace take one capsule in morning 135m and  (2529m  in evening     vitamin B-12 1000 MCG tablet  Commonly known as:  CYANOCOBALAMIN  Take 1,000 mcg by mouth daily.        Review of Systems:  Review of Systems  HENT: Negative for congestion and hearing loss.   Eyes: Negative.  Negative for discharge.  Respiratory: Negative for cough and shortness of breath.   Cardiovascular: Negative for chest pain, palpitations and leg swelling.  Gastrointestinal: Positive for constipation. Negative for nausea, abdominal pain and diarrhea.  Genitourinary: Negative for dysuria and urgency.  Musculoskeletal: Negative for myalgias.  Skin: Negative for rash.  Neurological: Negative for dizziness, tremors, seizures and weakness.  Psychiatric/Behavioral: Negative for hallucinations. The patient is not nervous/anxious.     Health Maintenance  Topic Date Due  . FOOT EXAM  07/19/1933  . OPHTHALMOLOGY EXAM  07/19/1933  . ZOSTAVAX  07/20/1983  . PNA vac Low Risk Adult (2 of 2 - PCV13) 08/18/2007  . URINE MICROALBUMIN  04/30/2008  . HEMOGLOBIN A1C  12/07/2008  . TETANUS/TDAP  12/18/2015  . INFLUENZA VACCINE  07/17/2016  . DEXA SCAN  Completed    Physical Exam: Filed Vitals:   04/19/16 1709  BP: 154/64  Pulse: 66  Temp: 97.5 F (36.4 C)  TempSrc: Oral  Height: 5' 3.5" (1.613 m)  Weight: 151 lb 9.6 oz (68.765 kg)   Body mass index is 26.43 kg/(m^2). Physical Exam  Constitutional: She is oriented to person, place, and time. She appears  well-developed and well-nourished.  No distress.  HENT:  Head: Normocephalic and atraumatic.  Mouth/Throat: Oropharynx is clear and moist. No oropharyngeal exudate.  Eyes: Conjunctivae are normal. Pupils are equal, round, and reactive to light.  Neck: Normal range of motion. Neck supple.  Cardiovascular: Normal rate, regular rhythm and normal heart sounds.   Occasional PACs  Pulmonary/Chest: Effort normal and breath sounds normal. No respiratory distress. She has no wheezes. She has no rales.  Abdominal: Soft. Bowel sounds are normal.  Genitourinary: Guaiac negative stool.  Musculoskeletal: Normal range of motion. She exhibits no edema or tenderness.  Neurological: She is alert and oriented to person, place, and time.  Skin: Skin is warm and dry. She is not diaphoretic.  Left parietal area skin cancer removal scar  Psychiatric: She has a normal mood and affect.    Labs reviewed: Basic Metabolic Panel:  Recent Labs  06/14/15 1413 06/21/15 10/20/15 04/12/16  NA 131* 132* 134* 129*  K 4.6 4.6 4.3 4.8  CL 90*  --   --   --   CO2 25  --   --   --   GLUCOSE 68  --   --   --   BUN 14 17 15 17   CREATININE 1.02* 1.1 1.0 1.1  CALCIUM 10.5*  --   --   --   TSH 2.080  --   --  3.51   Liver Function Tests:  Recent Labs  06/14/15 1413 10/20/15 04/12/16  AST 24 22 24   ALT 16 16 16   ALKPHOS 107 87 84  BILITOT 0.2  --   --   PROT 7.4  --   --   ALBUMIN 4.0  --   --    No results for input(s): LIPASE, AMYLASE in the last 8760 hours. No results for input(s): AMMONIA in the last 8760 hours. CBC:  Recent Labs  06/14/15 1413 10/20/15 04/12/16  WBC 8.0 6.0 5.4  NEUTROABS 6.2  --   --   HGB  --  11.8* 11.3*  HCT 36.9 36 34*  MCV 96  --   --   PLT 297 288 308   Lipid Panel:  Recent Labs  10/20/15 04/12/16  CHOL 205* 188  HDL 73* 62  LDLCALC 114 102  TRIG 91 122   Lab Results  Component Value Date   HGBA1C 5.5 06/07/2008    Procedures since last visit: Dg Ankle  Complete Left  04/20/2016  CLINICAL DATA:  Left foot pain, possible fracture EXAM: LEFT ANKLE COMPLETE - 3+ VIEW COMPARISON:  None. FINDINGS: Four views of the left ankle submitted. No acute fracture or subluxation. There is diffuse osteopenia. Ankle mortise is preserved. IMPRESSION: No acute fracture or subluxation.  Diffuse osteopenia. Electronically Signed   By: Lahoma Crocker M.D.   On: 04/20/2016 12:29   Dg Foot Complete Left  04/20/2016  CLINICAL DATA:  Left foot pain, possible fracture EXAM: LEFT FOOT - COMPLETE 3+ VIEW COMPARISON:  07/20/2013 FINDINGS: Three views of the left foot submitted. No acute fracture or subluxation. There is hallux valgus deformity. Diffuse osteopenia. Mild degenerative/ that minimal degenerative changes first metatarsal phalangeal joint. IMPRESSION: No acute fracture or subluxation. Mild hallux valgus deformity. Mild degenerative changes first metatarsal phalangeal joint. Diffuse osteopenia. Electronically Signed   By: Lahoma Crocker M.D.   On: 04/20/2016 12:31    Assessment/Plan Anemia takes Vit B12, Folate, iron, 04/12/16 Hgb 11.3  Anxiety state Stable.   Constipation Stable, continue Colace 251m bid and Fiber II tabs daily.  GERD Has been stable, continue Omeprazole 2m daily.    HTN (hypertension) Controlled, continue Atenolol 12.581mdaily   Hyponatremia 06/21/15 Na 132 10/20/15 Na 134 04/12/16 Na 129 Repeat BMP, may consider serum and urine osmolality  Pruritus Better symptomatic controlled on Claritin 1023mod     Labs/tests ordered:  @ORDERS @ BMP Next appt:  05/17/2016

## 2016-04-20 NOTE — Patient Instructions (Addendum)
IF you received an x-ray today, you will receive an invoice from [facility]. Please contact [facility] at [phone] with questions or concerns regarding your invoice.   IF you received labwork today, you will receive an invoice from [employer]. Please contact [facility] at [phone] with questions or concerns regarding your invoice.   Our billing staff will not be able to assist you with questions regarding bills from these companies.  You will be contacted with the lab results as soon as they are available. The fastest way to get your results is to activate your My Chart account. Instructions are located on the last page of this paperwork. If you have not heard from [place] regarding the results in 2 weeks, please contact this office.     Elastic Bandage and RICE WHAT DOES AN ELASTIC BANDAGE DO? Elastic bandages come in different shapes and sizes. They generally provide support to your injury and reduce swelling while you are healing, but they can perform different functions. Your health care provider will help you to decide what is best for your protection, recovery, or rehabilitation following an injury. WHAT ARE SOME GENERAL TIPS FOR USING AN ELASTIC BANDAGE?  Use the bandage as directed by the maker of the bandage that you are using.  Do not wrap the bandage too tightly. This may cut off the circulation in the arm or leg in the area below the bandage.  If part of your body beyond the bandage becomes blue, numb, cold, swollen, or is more painful, your bandage is most likely too tight. If this occurs, remove your bandage and reapply it more loosely.  See your health care provider if the bandage seems to be making your problems worse rather than better.  An elastic bandage should be removed and reapplied every 3-4 hours or as directed by your health care provider. WHAT IS RICE? The routine care of many injuries includes rest, ice,  compression, and elevation (RICE therapy).  Rest Rest is required to allow your body to heal. Generally, you can resume your routine activities when you are comfortable and have been given permission by your health care provider. Ice Icing your injury helps to keep the swelling down and it reduces pain. Do not apply ice directly to your skin.  Put ice in a plastic bag.  Place a towel between your skin and the bag.  Leave the ice on for 20 minutes, 2-3 times per day. Do this for as long as you are directed by your health care provider. Compression Compression helps to keep swelling down, gives support, and helps with discomfort. Compression may be done with an elastic bandage. Elevation Elevation helps to reduce swelling and it decreases pain. If possible, your injured area should be placed at or above the level of your heart or the center of your chest. [place]? You should seek medical care if:  You have persistent pain and swelling.  Your symptoms are getting worse rather than improving. These symptoms may indicate that further evaluation or further X-rays are needed. Sometimes, X-rays may not show a small broken bone (fracture) until a number of days later. Make a follow-up appointment with your health care provider. Ask when your X-ray results will be ready. Make sure that you get your X-ray results. WHEN SHOULD I SEEK IMMEDIATE MEDICAL CARE? You should seek immediate medical care if:  You have a sudden onset of severe pain at or below the area  of your injury.  You develop redness or increased swelling around your injury.  You have tingling or numbness at or below the area of your injury that does not improve after you remove the elastic bandage.   This information is not intended to replace advice given to you by your health care provider. Make sure you discuss any questions you have with your health care provider.   Document Released: 05/25/2002 Document  Revised: 08/24/2015 Document Reviewed: 07/19/2014 Elsevier Interactive Patient Education [employer].

## 2016-04-20 NOTE — Assessment & Plan Note (Signed)
Stable, continue Colace 224m bid and Fiber II tabs daily.

## 2016-04-20 NOTE — Assessment & Plan Note (Signed)
Stable

## 2016-04-20 NOTE — Assessment & Plan Note (Signed)
takes Vit B12, Folate, iron, 04/12/16 Hgb 11.3

## 2016-04-20 NOTE — Progress Notes (Signed)
04/20/2016 1:31 PM   DOB: 03/26/1921 / MRN: [id number]  SUBJECTIVE:  [name] is a 82 y.o. female with a history of osteopenia presenting for left foot and ankle pain.  Reports she was at the doctor's office yesterday and was coming down from the exam table and felt a twinge in her left ankle. This felt okay until 1 am when she awoke to moderate ankle pain. She called the nurse at her assisted living facility who advised the ankle be evaluated before placing an ace wrap.  She reports pain with ambulation today, however she feels it is improvement.  She complains of tenderness over the insertion of the ATFL.   She is allergic to ibuprofen; naproxen; statins; surgilube; and tape.   She  has a past medical history of Lung cancer ([place]) (05/2010); Rheumatic fever; Pneumonia; Biceps tendon rupture; Elevated liver enzymes; Right thyroid nodule; Anxiety; Lymphedema; Overactive bladder; PSVT (paroxysmal supraventricular tachycardia) ([place]); Osteoporosis, senile; Osteoarthritis; Low back pain; Hyperlipidemia; GERD (gastroesophageal reflux disease) (06/2007); Type 2 diabetes mellitus ([place]); Anemia; Allergic rhinitis; Insomnia; Diverticulosis; Hemorrhoids; Hiatal hernia; Steatohepatitis; Fundic gland polyps of stomach, benign; radiation therapy (07/17/10 to 07/21/10); Breast cancer ([place]) (2001); Depression; Glaucoma (2013); Cough (2014); and Bronchiectasis ([place]) (2014).    She  reports that she has never smoked. She has never used smokeless tobacco. She reports that she does not drink alcohol or use illicit drugs. She  reports that she does not currently engage in sexual activity. The patient  has past surgical history that includes Cataract extraction (Bilateral, 2003); Cholecystectomy (1965); Total hip arthroplasty (Bilateral, 2005 & 2007 ); Abdominal hysterectomy (1964); Breast lumpectomy (2001); Cystoscopy (2007); Breast biopsy; Revision total hip arthroplasty (2007); Liver biopsy (2008); Colonoscopy (09/11/2011);  Esophagogastroduodenoscopy (09/11/2011); Colonoscopy (2005 /2012); Tonsillectomy (1930); Appendectomy (1964); Biopsy thyroid (11/2008); Lung biopsy (Left, 06/06/2010); and Skin cancer excision.  Her family history includes Cancer in her maternal grandmother; Heart failure in her brother, father, and mother; Hypertension in her father; Leukemia in her brother; Osteoporosis in her mother; Stroke in her father and maternal grandfather.  Review of Systems  Constitutional: Negative for fever.  Musculoskeletal: Positive for joint pain. Negative for myalgias, back pain, falls and neck pain.  Skin: Negative for rash.  Neurological: Negative for dizziness.    Problem list and medications reviewed and updated by myself where necessary, and exist elsewhere in the encounter.   OBJECTIVE:  BP 124/72 mmHg  Pulse 77  Temp(Src) 98.7 F (37.1 C) (Oral)  Resp 18  Ht 5' 3"  (1.6 m)  Wt 148 lb (67.132 kg)  BMI 26.22 kg/m2  SpO2 95%  Physical Exam  Constitutional: She is oriented to person, place, and time. She appears well-nourished. No distress.  Eyes: EOM are normal. Pupils are equal, round, and reactive to light.  Cardiovascular: Normal rate.   Pulmonary/Chest: Effort normal.  Abdominal: She exhibits no distension.  Musculoskeletal:       Right ankle: Normal.       Left ankle: She exhibits swelling (about the lateral maleolous. ). She exhibits no ecchymosis, no deformity, no laceration and normal pulse. Tenderness. AITFL and head of 5th metatarsal tenderness found. No lateral malleolus, no medial malleolus, no CF ligament, no posterior TFL and no proximal fibula tenderness found. Achilles tendon exhibits no pain, no defect and normal Thompson's test results.       Legs: Neurological: She is alert and oriented to person, place, and time. No cranial nerve deficit. Gait normal.  Skin: Skin is dry.  She is not diaphoretic.  Psychiatric: She has a normal mood and affect.  Vitals reviewed.   No results  found for this or any previous visit (from the past 72 hour(s)).  Dg Ankle Complete Left  04/20/2016  CLINICAL DATA:  Left foot pain, possible fracture EXAM: LEFT ANKLE COMPLETE - 3+ VIEW COMPARISON:  None. FINDINGS: Four views of the left ankle submitted. No acute fracture or subluxation. There is diffuse osteopenia. Ankle mortise is preserved. IMPRESSION: No acute fracture or subluxation.  Diffuse osteopenia. Electronically Signed   By: Lahoma Crocker M.D.   On: 04/20/2016 12:29   Dg Foot Complete Left  04/20/2016  CLINICAL DATA:  Left foot pain, possible fracture EXAM: LEFT FOOT - COMPLETE 3+ VIEW COMPARISON:  07/20/2013 FINDINGS: Three views of the left foot submitted. No acute fracture or subluxation. There is hallux valgus deformity. Diffuse osteopenia. Mild degenerative/ that minimal degenerative changes first metatarsal phalangeal joint. IMPRESSION: No acute fracture or subluxation. Mild hallux valgus deformity. Mild degenerative changes first metatarsal phalangeal joint. Diffuse osteopenia. Electronically Signed   By: Lahoma Crocker M.D.   On: 04/20/2016 12:31    ASSESSMENT AND PLAN  [name] was seen today for foot pain.  Diagnoses and all orders for this visit:  Ankle sprain, left, initial encounter: Ace wrap applied. RTC as needed.  Advised that continue ambulating as this sprain appears mild and the risk of immobility due to a lack of ambulation does not outweigh the benefit given the possibility of deconditioning.  -     DG Ankle Complete Left; Future -     DG Foot Complete Left; Future    The patient was advised to call or return to clinic if she does not see an improvement in symptoms or to seek the care of the closest emergency department if she worsens with the above plan.   Philis Fendt, MHS, PA-C [facility] 04/20/2016 1:31 PM

## 2016-04-20 NOTE — Assessment & Plan Note (Signed)
06/21/15 Na 132 10/20/15 Na 134 04/12/16 Na 129 Repeat BMP, may consider serum and urine osmolality

## 2016-04-20 NOTE — Assessment & Plan Note (Signed)
Has been stable, continue Omeprazole 69m daily.

## 2016-04-20 NOTE — Assessment & Plan Note (Signed)
Controlled, continue Atenolol 12.14m daily

## 2016-05-03 ENCOUNTER — Encounter: Payer: Self-pay | Admitting: Nurse Practitioner

## 2016-05-10 ENCOUNTER — Encounter: Payer: Self-pay | Admitting: *Deleted

## 2016-05-10 DIAGNOSIS — I10 Essential (primary) hypertension: Secondary | ICD-10-CM | POA: Diagnosis not present

## 2016-05-10 LAB — BASIC METABOLIC PANEL
BUN: 17 mg/dL (ref 4–21)
CREATININE: 1 mg/dL (ref 0.5–1.1)
Glucose: 62 mg/dL
POTASSIUM: 4.5 mmol/L (ref 3.4–5.3)
Sodium: 131 mmol/L — AB (ref 137–147)

## 2016-05-17 ENCOUNTER — Non-Acute Institutional Stay: Payer: Medicare Other | Admitting: Nurse Practitioner

## 2016-05-17 ENCOUNTER — Encounter: Payer: Self-pay | Admitting: Nurse Practitioner

## 2016-05-17 VITALS — BP 136/60 | HR 80 | Temp 97.2°F | Resp 20 | Ht 63.0 in | Wt 152.2 lb

## 2016-05-17 DIAGNOSIS — I471 Supraventricular tachycardia: Secondary | ICD-10-CM | POA: Diagnosis not present

## 2016-05-17 DIAGNOSIS — E871 Hypo-osmolality and hyponatremia: Secondary | ICD-10-CM

## 2016-05-17 DIAGNOSIS — K219 Gastro-esophageal reflux disease without esophagitis: Secondary | ICD-10-CM

## 2016-05-17 DIAGNOSIS — K5900 Constipation, unspecified: Secondary | ICD-10-CM

## 2016-05-17 DIAGNOSIS — I10 Essential (primary) hypertension: Secondary | ICD-10-CM

## 2016-05-17 NOTE — Assessment & Plan Note (Signed)
Rate is controlled, continue Atenolol.

## 2016-05-17 NOTE — Progress Notes (Signed)
Patient ID: [name], female   DOB: [birth year], 82 y.o.   MRN: [id number]   Location:   clinic [place]   Place of Service:   clinic [place]  Provider: Marlana Latus NP  Code Status: DNR Goals of Care:  Advanced Directives 05/17/2016  Does patient have an advance directive? Yes  Type of Advance Directive [place]  Does patient want to make changes to advanced directive? No - Patient declined  Copy of advanced directive(s) in chart? Yes     Chief Complaint  Patient presents with  . Medical Management of Chronic Issues    HPI: Patient is a 82 y.o. female seen today to evaluate chronic medical conditions. Hx of hyponatremia, 04/12/16 129, 131 05/10/16. Blood pressure is controlled while on Atenolol 12.19m daily. Takes Vit B12 16387FIEdaily, folic acid 4332RJJdaily, Hgb 11.3 04/12/16. Constipation, managed with Fiber II daily, Colace 2576mbid, still had hard stools. Still functioning well in [place] setting.   Past Medical History  Diagnosis Date  . Lung cancer ([facility]6/2011    Left 2011 - rad x 3  . Rheumatic fever     Age 82. Pneumonia     RLL with sepsis  . Biceps tendon rupture     Bilateral  . Elevated liver enzymes     Biopsy consistent with steatohepatitis  . Right thyroid nodule   . Anxiety   . Lymphedema     Left arm  . Overactive bladder   . PSVT (paroxysmal supraventricular tachycardia) ([facility]  . Osteoporosis, senile   . Osteoarthritis   . Low back pain     scoliosis  . Hyperlipidemia   . GERD (gastroesophageal reflux disease) 06/2007    EGD Dr RoGala Romneysm [facility], multiple fundic gland polyps  . Type 2 diabetes mellitus ([facility]  . Anemia     NOS  . Allergic rhinitis   . Insomnia   . Diverticulosis   . Hemorrhoids   . Hiatal hernia   . Steatohepatitis     liver bx  . Fundic gland polyps of stomach, benign   . Hx of radiation therapy 07/17/10 to 07/21/10    SRS LLL lung  . Breast cancer ([facility]2001    Left s/p lumpectomy and XRT   . Depression   . Glaucoma  2013    both eyes  . Cough 2014  . Bronchiectasis ([facility]    Noted on chest x-ray    Past Surgical History  Procedure Laterality Date  . Cataract extraction Bilateral 2003    Lens implant Dr. EpCharise Killian. Cholecystectomy  1965  . Total hip arthroplasty Bilateral 2005 & 2007     Dr. BlEarl LagosHaAline Brochure . Abdominal hysterectomy  1964  . Breast lumpectomy  2001  . Cystoscopy  2007  . Breast biopsy      X 3 w/ cystectomy  . Revision total hip arthroplasty  2007    Left  . Liver biopsy  2008  . Colonoscopy  09/11/2011    Procedure: COLONOSCOPY;  Surgeon: SaDorothyann PengMD;  [facility];  Service: Endoscopy;  Laterality: N/A;  . Esophagogastroduodenoscopy  09/11/2011    Procedure: ESOPHAGOGASTRODUODENOSCOPY (EGD);  Surgeon: SaDorothyann PengMD;  Location: [facility];  Service: Endoscopy;  Laterality: N/A;  . Colonoscopy  2005 /2012    Dr. ReLucianne Mussdiverticulosis, ext hemorrhoids.  . Tonsillectomy  1930  . Appendectomy  1964  . Biopsy thyroid  11/2008  . Lung biopsy Left 06/06/2010  . Skin cancer excision      nose and left lower cheek    Allergies  Allergen Reactions  . Ibuprofen Hives  . Naproxen   . Statins Other (See Comments)    Feels like knives in stomach  . Surgilube [Gyne-Moistrin]     Rectal itching, burning  . Tape Rash      Medication List       This list is accurate as of: 05/17/16  4:30 PM.  Always use your most recent med list.               ALLERGY RELIEF 10 MG tablet  Generic drug:  loratadine  Take 10 mg by mouth every other day.     aspirin 81 MG EC tablet  Take 81 mg by mouth daily.     atenolol 25 MG tablet  Commonly known as:  TENORMIN  TAKE (1/2) TABLET DAILY FOR BLOOD PRESSURE.     Biotin 1000 MCG tablet  Take 1,000 mcg by mouth every evening.     brimonidine 0.15 % ophthalmic solution  Commonly known as:  ALPHAGAN     CALTRATE 600+D 600-400 MG-UNIT tablet  Generic drug:  Calcium Carbonate-Vitamin D  Take 1 tablet  by mouth daily.     Fiber Tabs  Take 2 tablets by mouth daily.     folic acid 583 MCG tablet  Commonly known as:  FOLVITE  Take 400 mcg by mouth daily.     IRON PO  Take 65 mg by mouth daily.     latanoprost 0.005 % ophthalmic solution  Commonly known as:  XALATAN  Place 1 drop into both eyes daily.     multivitamin capsule  Take 1 capsule by mouth daily.     omeprazole 20 MG capsule  Commonly known as:  PRILOSEC  TAKE 1 CAPSULE EVERY DAY.     STOOL SOFTENER PO  Take 1 capsule by mouth. Colace take one capsule in morning 162m and  (25100m  in evening     vitamin B-12 1000 MCG tablet  Commonly known as:  CYANOCOBALAMIN  Take 1,000 mcg by mouth daily.        Review of Systems:  Review of Systems  HENT: Negative for congestion and hearing loss.   Eyes: Negative.  Negative for discharge.  Respiratory: Negative for cough and shortness of breath.   Cardiovascular: Negative for chest pain, palpitations and leg swelling.  Gastrointestinal: Positive for constipation. Negative for nausea, abdominal pain and diarrhea.  Genitourinary: Negative for dysuria and urgency.  Musculoskeletal: Negative for myalgias.  Skin: Negative for rash.  Neurological: Negative for dizziness, tremors, seizures and weakness.  Psychiatric/Behavioral: Negative for hallucinations. The patient is not nervous/anxious.     Health Maintenance  Topic Date Due  . FOOT EXAM  07/19/1931  . OPHTHALMOLOGY EXAM  [birth year]19/1931  . ZOSTAVAX  07/20/1983  . PNA vac Low Risk Adult (2 of 2 - PCV13) 08/18/2007  . URINE MICROALBUMIN  04/30/2008  . HEMOGLOBIN A1C  12/07/2008  . TETANUS/TDAP  12/18/2015  . INFLUENZA VACCINE  07/17/2016  . DEXA SCAN  Completed    Physical Exam: Filed Vitals:   05/17/16 1546  BP: 136/60  Pulse: 80  Temp: 97.2 F (36.2 C)  TempSrc: Oral  Resp: 20  Height: 5' 3"  (1.6 m)  Weight: 152 lb 3.2 oz (69.037 kg)  SpO2: 98%   Body mass index is 26.97 kg/(m^2). Physical Exam  Constitutional: She is oriented to person, place, and time. She appears well-developed and well-nourished. No distress.  HENT:  Head: Normocephalic and atraumatic.  Mouth/Throat: Oropharynx is clear and moist. No oropharyngeal exudate.  Eyes: Conjunctivae are normal. Pupils are equal, round, and reactive to light.  Neck: Normal range of motion. Neck supple.  Cardiovascular: Normal rate, regular rhythm and normal heart sounds.   Occasional PACs  Pulmonary/Chest: Effort normal and breath sounds normal. No respiratory distress. She has no wheezes. She has no rales.  Abdominal: Soft. Bowel sounds are normal.  Genitourinary: Guaiac negative stool.  Musculoskeletal: Normal range of motion. She exhibits no edema or tenderness.  Neurological: She is alert and oriented to person, place, and time.  Skin: Skin is warm and dry. She is not diaphoretic.  Left parietal area skin cancer removal scar  Psychiatric: She has a normal mood and affect.    Labs reviewed: Basic Metabolic Panel:  Recent Labs  06/14/15 1413  10/20/15 04/12/16 05/10/16  NA 131*  < > 134* 129* 131*  K 4.6  < > 4.3 4.8 4.5  CL 90*  --   --   --   --   CO2 25  --   --   --   --   GLUCOSE 68  --   --   --   --   BUN 14  < > 15 17 17   CREATININE 1.02*  < > 1.0 1.1 1.0  CALCIUM 10.5*  --   --   --   --   TSH 2.080  --   --  3.51  --   < > = values in this interval not displayed. Liver Function Tests:  Recent Labs  06/14/15 1413 10/20/15 04/12/16  AST 24 22 24   ALT 16 16 16   ALKPHOS 107 87 84  BILITOT 0.2  --   --   PROT 7.4  --   --   ALBUMIN 4.0  --   --    No results for input(s): LIPASE, AMYLASE in the last 8760 hours. No results for input(s): AMMONIA in the last 8760 hours. CBC:  Recent Labs  06/14/15 1413 10/20/15 04/12/16  WBC 8.0 6.0 5.4  NEUTROABS 6.2  --   --   HGB  --  11.8* 11.3*  HCT 36.9 36 34*  MCV 96  --   --   PLT 297 288 308   Lipid Panel:  Recent Labs  10/20/15 04/12/16  CHOL 205*  188  HDL 73* 62  LDLCALC 114 102  TRIG 91 122   Lab Results  Component Value Date   HGBA1C 5.5 06/07/2008    Procedures since last visit: Dg Ankle Complete Left  04/20/2016  CLINICAL DATA:  Left foot pain, possible fracture EXAM: LEFT ANKLE COMPLETE - 3+ VIEW COMPARISON:  None. FINDINGS: Four views of the left ankle submitted. No acute fracture or subluxation. There is diffuse osteopenia. Ankle mortise is preserved. IMPRESSION: No acute fracture or subluxation.  Diffuse osteopenia. Electronically Signed   By: Lahoma Crocker M.D.   On: 04/20/2016 12:29   Dg Foot Complete Left  04/20/2016  CLINICAL DATA:  Left foot pain, possible fracture EXAM: LEFT FOOT - COMPLETE 3+ VIEW COMPARISON:  07/20/2013 FINDINGS: Three views of the left foot submitted. No acute fracture or subluxation. There is hallux valgus deformity. Diffuse osteopenia. Mild degenerative/ that minimal degenerative changes first metatarsal phalangeal joint. IMPRESSION: No acute fracture or subluxation. Mild hallux valgus deformity. Mild degenerative changes first  metatarsal phalangeal joint. Diffuse osteopenia. Electronically Signed   By: Lahoma Crocker M.D.   On: 04/20/2016 12:31    Assessment/Plan PAROXYSMAL SUPRAVENTRICULAR TACHYCARDIA Rate is controlled, continue Atenolol.   HTN (hypertension) Controlled, continue Atenolol 12.75m daily  GERD Has been stable, continue Omeprazole 227mdaily.   Constipation Stable, continue Colace 25039mid and Fiber II tabs daily. Senna  S I po night.   Hyponatremia 06/21/15 Na 132 10/20/15 Na 134 04/12/16 Na 129 05/10/16 Na 131 Update BMP prior to next appointment 6 months.       Labs/tests ordered:  @ORDERS @ BMP prior next appointment Next appt:  6 months.

## 2016-05-17 NOTE — Assessment & Plan Note (Signed)
Has been stable, continue Omeprazole 52m daily.

## 2016-05-17 NOTE — Assessment & Plan Note (Signed)
Controlled, continue Atenolol 12.4m daily

## 2016-05-17 NOTE — Assessment & Plan Note (Addendum)
Stable, continue Colace 25m bid and Fiber II tabs daily. Senna  S I po night.

## 2016-05-17 NOTE — Assessment & Plan Note (Addendum)
06/21/15 Na 132 10/20/15 Na 134 04/12/16 Na 129 05/10/16 Na 131 Update BMP prior to next appointment 6 months.

## 2016-08-02 ENCOUNTER — Other Ambulatory Visit: Payer: Self-pay | Admitting: Internal Medicine

## 2016-09-17 DIAGNOSIS — H401131 Primary open-angle glaucoma, bilateral, mild stage: Secondary | ICD-10-CM | POA: Diagnosis not present

## 2016-09-26 DIAGNOSIS — L821 Other seborrheic keratosis: Secondary | ICD-10-CM | POA: Diagnosis not present

## 2016-09-26 DIAGNOSIS — D1801 Hemangioma of skin and subcutaneous tissue: Secondary | ICD-10-CM | POA: Diagnosis not present

## 2016-09-26 DIAGNOSIS — L570 Actinic keratosis: Secondary | ICD-10-CM | POA: Diagnosis not present

## 2016-09-26 DIAGNOSIS — L218 Other seborrheic dermatitis: Secondary | ICD-10-CM | POA: Diagnosis not present

## 2016-09-26 DIAGNOSIS — Z85828 Personal history of other malignant neoplasm of skin: Secondary | ICD-10-CM | POA: Diagnosis not present

## 2016-09-27 DIAGNOSIS — Z23 Encounter for immunization: Secondary | ICD-10-CM | POA: Diagnosis not present

## 2016-10-24 DIAGNOSIS — L570 Actinic keratosis: Secondary | ICD-10-CM | POA: Diagnosis not present

## 2016-10-24 DIAGNOSIS — L218 Other seborrheic dermatitis: Secondary | ICD-10-CM | POA: Diagnosis not present

## 2016-10-25 ENCOUNTER — Non-Acute Institutional Stay: Payer: Medicare Other | Admitting: Internal Medicine

## 2016-10-25 ENCOUNTER — Encounter: Payer: Self-pay | Admitting: Internal Medicine

## 2016-10-25 VITALS — BP 128/82 | HR 67 | Temp 98.1°F | Ht 63.0 in | Wt 154.0 lb

## 2016-10-25 DIAGNOSIS — H9209 Otalgia, unspecified ear: Secondary | ICD-10-CM | POA: Insufficient documentation

## 2016-10-25 DIAGNOSIS — M8588 Other specified disorders of bone density and structure, other site: Secondary | ICD-10-CM | POA: Diagnosis not present

## 2016-10-25 DIAGNOSIS — E871 Hypo-osmolality and hyponatremia: Secondary | ICD-10-CM

## 2016-10-25 DIAGNOSIS — I10 Essential (primary) hypertension: Secondary | ICD-10-CM | POA: Diagnosis not present

## 2016-10-25 DIAGNOSIS — M8580 Other specified disorders of bone density and structure, unspecified site: Secondary | ICD-10-CM

## 2016-10-25 DIAGNOSIS — H9201 Otalgia, right ear: Secondary | ICD-10-CM | POA: Diagnosis not present

## 2016-10-25 HISTORY — DX: Other specified disorders of bone density and structure, unspecified site: M85.80

## 2016-10-25 MED ORDER — ANTIPYRINE-BENZOCAINE 5.4-1.4 % OT SOLN: OTIC | 1 refills | Status: DC

## 2016-10-25 NOTE — Progress Notes (Signed)
Facility  [facility]    Place of Service: Clinic (12)     Allergies  Allergen Reactions  . Ibuprofen Hives  . Naproxen   . Statins Other (See Comments)    Feels like knives in stomach  . Surgilube [Gyne-Moistrin]     Rectal itching, burning  . Tape Rash    Chief Complaint  Patient presents with  . Acute Visit    right ear pain, started this morning    HPI:  Right ear pain - acute onset this morning.  No feveer or chills. Mild discomfort swallowing.  Essential hypertension - controlled  Hyponatremia - stable  Osteopenia of spine - last DEXA was Sept 2015. She would like to schedule another one.    Medications: Patient's Medications  New Prescriptions   No medications on file  Previous Medications   ASPIRIN 81 MG EC TABLET    Take 81 mg by mouth daily.     ATENOLOL (TENORMIN) 25 MG TABLET    TAKE (1/2) TABLET DAILY FOR BLOOD PRESSURE.   BIOTIN 1000 MCG TABLET    Take 1,000 mcg by mouth every evening.    BRIMONIDINE (ALPHAGAN) 0.15 % OPHTHALMIC SOLUTION       CALCIUM CARBONATE-VITAMIN D (CALTRATE 600+D) 600-400 MG-UNIT PER TABLET    Take 1 tablet by mouth daily.     DOCUSATE CALCIUM (STOOL SOFTENER PO)    Take 1 capsule by mouth. Colace take one capsule 250 mg twice daily   FIBER TABS    Take 2 tablets by mouth daily.    FOLIC ACID (FOLVITE) 161 MCG TABLET    Take 400 mcg by mouth daily.     IRON PO    Take 65 mg by mouth daily.    LATANOPROST (XALATAN) 0.005 % OPHTHALMIC SOLUTION    Place 1 drop into both eyes daily.    LORATADINE (ALLERGY RELIEF) 10 MG TABLET    Take 10 mg by mouth. Take one tablet twice daily   MULTIPLE VITAMIN (MULTIVITAMIN) CAPSULE    Take 1 capsule by mouth daily.    OMEPRAZOLE (PRILOSEC) 20 MG CAPSULE    TAKE 1 CAPSULE EVERY DAY.   VITAMIN B-12 (CYANOCOBALAMIN) 1000 MCG TABLET    Take 1,000 mcg by mouth daily.  Modified Medications   No medications on file  Discontinued Medications   No medications on file     Review of Systems    Constitutional: Negative for activity change, appetite change, chills, diaphoresis, fatigue, fever and unexpected weight change.  HENT: Positive for ear pain. Negative for congestion, ear discharge, hearing loss, postnasal drip, rhinorrhea, sore throat, tinnitus, trouble swallowing and voice change.   Eyes: Negative.  Negative for pain, discharge, redness, itching and visual disturbance.  Respiratory: Negative for cough, choking, shortness of breath and wheezing.   Cardiovascular: Negative for chest pain, palpitations and leg swelling.  Gastrointestinal: Positive for constipation. Negative for abdominal distention, abdominal pain, diarrhea and nausea.  Endocrine: Negative for cold intolerance, heat intolerance, polydipsia, polyphagia and polyuria.  Genitourinary: Negative for difficulty urinating, dysuria, flank pain, frequency, hematuria, pelvic pain, urgency and vaginal discharge.  Musculoskeletal: Negative for arthralgias, back pain, gait problem, myalgias, neck pain and neck stiffness.  Skin: Negative for color change, pallor and rash.  Allergic/Immunologic: Negative.   Neurological: Negative for dizziness, tremors, seizures, syncope, weakness, numbness and headaches.  Hematological: Negative for adenopathy. Does not bruise/bleed easily.  Psychiatric/Behavioral: Negative for agitation, behavioral problems, confusion, dysphoric mood, hallucinations, sleep disturbance and suicidal ideas.  The patient is not nervous/anxious and is not hyperactive.     Vitals:   10/25/16 1316  BP: 128/82  Pulse: 67  Temp: 98.1 F (36.7 C)  TempSrc: Oral  SpO2: 96%  Weight: 154 lb (69.9 kg)  Height: 5' 3"  (1.6 m)   Wt Readings from Last 3 Encounters:  10/25/16 154 lb (69.9 kg)  05/17/16 152 lb 3.2 oz (69 kg)  04/20/16 148 lb (67.1 kg)    Body mass index is 27.28 kg/m.  Physical Exam  Constitutional: She is oriented to person, place, and time. She appears well-developed and well-nourished. No  distress.  HENT:  Head: Normocephalic and atraumatic.  Mouth/Throat: Oropharynx is clear and moist. No oropharyngeal exudate.  Bilateral hearing loss. No inflammation of the TM.   Eyes: Conjunctivae are normal. Pupils are equal, round, and reactive to light.  Neck: Normal range of motion. Neck supple.  Cardiovascular: Normal rate, regular rhythm and normal heart sounds.   Occasional PACs  Pulmonary/Chest: Effort normal and breath sounds normal. No respiratory distress. She has no wheezes. She has no rales.  Abdominal: Soft. Bowel sounds are normal.  Genitourinary: Rectal exam shows guaiac negative stool.  Musculoskeletal: Normal range of motion. She exhibits no edema or tenderness.  Neurological: She is alert and oriented to person, place, and time.  Skin: Skin is warm and dry. She is not diaphoretic.  Left parietal area skin cancer removal scar  Psychiatric: She has a normal mood and affect.     Labs reviewed: Lab Summary Latest Ref Rng & Units 05/10/2016 04/12/2016 10/20/2015  Hemoglobin 12.0 - 16.0 g/dL (None) 11.3(A) 11.8(A)  Hematocrit 36 - 46 % (None) 34(A) 36  White count 10:3/mL (None) 5.4 6.0  Platelet count 150 - 399 K/L (None) 308 288  Sodium 137 - 147 mmol/L 131(A) 129(A) 134(A)  Potassium 3.4 - 5.3 mmol/L 4.5 4.8 4.3  Calcium - (None) (None) (None)  Phosphorus - (None) (None) (None)  Creatinine 0.5 - 1.1 mg/dL 1.0 1.1 1.0  AST 13 - 35 U/L (None) 24 22  Alk Phos 25 - 125 U/L (None) 84 87  Bilirubin - (None) (None) (None)  Glucose mg/dL 62 71 69  Cholesterol 0 - 200 mg/dL (None) 188 205(A)  HDL cholesterol 35 - 70 mg/dL (None) 62 73(A)  Triglycerides 40 - 160 mg/dL (None) 122 91  LDL Direct - (None) (None) (None)  LDL Calc mg/dL (None) 102 114  Total protein - (None) (None) (None)  Albumin - (None) (None) (None)  Some recent data might be hidden   Lab Results  Component Value Date   TSH 3.51 04/12/2016   Lab Results  Component Value Date   BUN 17 05/10/2016     BUN 17 04/12/2016   BUN 15 10/20/2015   Lab Results  Component Value Date   CREATININE 1.0 05/10/2016   CREATININE 1.1 04/12/2016   CREATININE 1.0 10/20/2015   Lab Results  Component Value Date   HGBA1C 5.5 06/07/2008   HGBA1C 5.7 07/31/2007   HGBA1C 5.8 05/01/2007       Assessment/Plan  1. Right ear pain - antipyrine-benzocaine (AURALGAN) otic solution; 3-4 drops in the right ear every 6 hours as needed to control discomfort  Dispense: 10 mL; Refill: 1 - call for worsenig, fever, or increased pain  2. Essential hypertension controlled  3. Hyponatremia stable  4. Osteopenia of spine -she has a reminder card from [facility]. I recommended that she call them directly. If DEXA is without significant change  this year, I would not recommend further testing due to here age of 89 years.

## 2016-11-01 ENCOUNTER — Telehealth: Payer: Self-pay | Admitting: *Deleted

## 2016-11-01 NOTE — Telephone Encounter (Signed)
Received a fax from [facility] [phone] stating that Antipyrine-Benzocaine Ear Drops is no longer made (nor anything like it) do you want [place] to suggest OTC sweet oil or homeopathic eardrops?  Per Dr. Gabrielle Dare Burnell Blanks is ok We tried faxing the response back to [facility] but would not go through. Called pharmacy and spoke with pharmacist.

## 2016-11-07 ENCOUNTER — Other Ambulatory Visit: Payer: Self-pay | Admitting: *Deleted

## 2016-11-07 DIAGNOSIS — I10 Essential (primary) hypertension: Secondary | ICD-10-CM

## 2016-11-19 DIAGNOSIS — M8588 Other specified disorders of bone density and structure, other site: Secondary | ICD-10-CM | POA: Diagnosis not present

## 2016-11-19 LAB — HM DEXA SCAN

## 2016-11-20 ENCOUNTER — Other Ambulatory Visit: Payer: Self-pay

## 2016-11-20 DIAGNOSIS — I10 Essential (primary) hypertension: Secondary | ICD-10-CM

## 2016-11-23 DIAGNOSIS — I10 Essential (primary) hypertension: Secondary | ICD-10-CM | POA: Diagnosis not present

## 2016-11-23 LAB — BASIC METABOLIC PANEL
BUN: 15 mg/dL (ref 7–25)
CHLORIDE: 98 mmol/L (ref 98–110)
CO2: 29 mmol/L (ref 20–31)
Calcium: 9.7 mg/dL (ref 8.6–10.4)
Creat: 1.02 mg/dL — ABNORMAL HIGH (ref 0.60–0.88)
GLUCOSE: 74 mg/dL (ref 65–99)
POTASSIUM: 4.3 mmol/L (ref 3.5–5.3)
Sodium: 135 mmol/L (ref 135–146)

## 2016-11-27 ENCOUNTER — Encounter: Payer: Self-pay | Admitting: *Deleted

## 2016-11-29 ENCOUNTER — Encounter: Payer: Self-pay | Admitting: Nurse Practitioner

## 2016-11-29 ENCOUNTER — Non-Acute Institutional Stay: Payer: Medicare Other | Admitting: Nurse Practitioner

## 2016-11-29 DIAGNOSIS — M150 Primary generalized (osteo)arthritis: Secondary | ICD-10-CM | POA: Diagnosis not present

## 2016-11-29 DIAGNOSIS — N318 Other neuromuscular dysfunction of bladder: Secondary | ICD-10-CM | POA: Diagnosis not present

## 2016-11-29 DIAGNOSIS — K5900 Constipation, unspecified: Secondary | ICD-10-CM

## 2016-11-29 DIAGNOSIS — I471 Supraventricular tachycardia: Secondary | ICD-10-CM

## 2016-11-29 DIAGNOSIS — E871 Hypo-osmolality and hyponatremia: Secondary | ICD-10-CM | POA: Diagnosis not present

## 2016-11-29 DIAGNOSIS — D500 Iron deficiency anemia secondary to blood loss (chronic): Secondary | ICD-10-CM | POA: Diagnosis not present

## 2016-11-29 DIAGNOSIS — I10 Essential (primary) hypertension: Secondary | ICD-10-CM | POA: Diagnosis not present

## 2016-11-29 DIAGNOSIS — M8949 Other hypertrophic osteoarthropathy, multiple sites: Secondary | ICD-10-CM

## 2016-11-29 DIAGNOSIS — M8580 Other specified disorders of bone density and structure, unspecified site: Secondary | ICD-10-CM

## 2016-11-29 DIAGNOSIS — K219 Gastro-esophageal reflux disease without esophagitis: Secondary | ICD-10-CM

## 2016-11-29 DIAGNOSIS — M159 Polyosteoarthritis, unspecified: Secondary | ICD-10-CM

## 2016-11-29 NOTE — Assessment & Plan Note (Signed)
takes Vit B12, Folate, iron, 04/12/16 Hgb 11.3

## 2016-11-29 NOTE — Assessment & Plan Note (Signed)
Stable, continue Colace 283m bid and Fiber II tabs daily. Senna  S I po night.

## 2016-11-29 NOTE — Assessment & Plan Note (Signed)
Has been stable, continue Omeprazole 86m daily.

## 2016-11-29 NOTE — Assessment & Plan Note (Signed)
Improved, Na 135 11/23/16

## 2016-11-29 NOTE — Assessment & Plan Note (Signed)
Bone density done 11/19/16, pending results, ttaking Vit D, Ca.

## 2016-11-29 NOTE — Assessment & Plan Note (Signed)
Rate is controlled, continue Atenolol.

## 2016-11-29 NOTE — Assessment & Plan Note (Signed)
Urinary leakage.

## 2016-11-29 NOTE — Assessment & Plan Note (Signed)
Ambulates with walker.

## 2016-11-29 NOTE — Assessment & Plan Note (Addendum)
Controlled, continue Atenolol 12.93m daily, update CBC CMP TSH

## 2016-11-29 NOTE — Progress Notes (Signed)
Patient ID: [name], female   DOB: 07-Oct-1921, 82 y.o.   MRN: [id number]   Location:   clinic [place]   Place of Service:  Clinic (12)clinic [facility]  Provider: Marlana Latus NP  Code Status: DNR Goals of Care:  Advanced Directives 11/29/2016  Does Patient Have a Medical Advance Directive? Yes  Type of Paramedic of [place];Living will  Does patient want to make changes to medical advance directive? No - Patient declined  Copy of [place] in Chart? Yes  Pre-existing out of facility DNR order (yellow form or pink MOST form) -     Chief Complaint  Patient presents with  . Medical Management of Chronic Issues    HPI: Patient is a 82 y.o. female seen today to evaluate chronic medical conditions. Hx of hyponatremia, 04/12/16 129, 131 05/10/16, 135 11/23/16. Blood pressure is controlled while on Atenolol 12.19m daily. Takes Vit B12 16503TWSdaily, folic acid 4568LEXdaily, Hgb 11.3 04/12/16. Constipation, managed with Fiber II daily, Colace 2564mbid, still had hard stools. Still functioning well in [place] setting.   Past Medical History:  Diagnosis Date  . Allergic rhinitis   . Anemia    NOS  . Anxiety   . Biceps tendon rupture    Bilateral  . Breast cancer ([facility]   Left s/p lumpectomy and XRT   . Bronchiectasis ([facility]2014   Noted on chest x-ray  . Cough 2014  . Depression   . Diverticulosis   . Elevated liver enzymes    Biopsy consistent with steatohepatitis  . Fundic gland polyps of stomach, benign   . GERD (gastroesophageal reflux disease) 06/2007   EGD Dr RoGala Romneysm [facility], multiple fundic gland polyps  . Glaucoma 2013   both eyes  . Hemorrhoids   . Hiatal hernia   . Hx of radiation therapy 07/17/10 to 07/21/10   SRS LLL lung  . Hyperlipidemia   . Insomnia   . Low back pain    scoliosis  . Lung cancer ([facility]6/2011   Left 2011 - rad x 3  . Lymphedema    Left arm  . Osteoarthritis   . Osteopenia 10/25/2016  . Osteoporosis, senile   .  Overactive bladder   . Pneumonia    RLL with sepsis  . PSVT (paroxysmal supraventricular tachycardia) ([facility]  . Rheumatic fever    Age 82. Right thyroid nodule   . Steatohepatitis    liver bx  . Type 2 diabetes mellitus ([facility]    Past Surgical History:  Procedure Laterality Date  . ABDOMINAL HYSTERECTOMY  1964  . APPENDECTOMY  1964  . BIOPSY THYROID  11/2008  . BREAST BIOPSY     X 3 w/ cystectomy  . BREAST LUMPECTOMY  2001  . CATARACT EXTRACTION Bilateral 2003   Lens implant Dr. EpCharise Killian. CHOLECYSTECTOMY  1965  . COLONOSCOPY  09/11/2011   Procedure: COLONOSCOPY;  Surgeon: SaDorothyann PengMD;  [facility];  Service: Endoscopy;  Laterality: N/A;  . COLONOSCOPY  2005 /2012   Dr. ReLucianne Mussdiverticulosis, ext hemorrhoids.  . CYSTOSCOPY  2007  . ESOPHAGOGASTRODUODENOSCOPY  09/11/2011   Procedure: ESOPHAGOGASTRODUODENOSCOPY (EGD);  Surgeon: SaDorothyann PengMD;  [facility];  Service: Endoscopy;  Laterality: N/A;  . LIVER BIOPSY  2008  . LUNG BIOPSY Left 06/06/2010  . REVISION TOTAL HIP ARTHROPLASTY  2007   Left  . SKIN CANCER EXCISION  nose and left lower cheek  . TONSILLECTOMY  1930  . TOTAL HIP ARTHROPLASTY Bilateral 2005 & 2007    Dr. Earl Lagos. Aline Brochure     Allergies  Allergen Reactions  . Ibuprofen Hives  . Naproxen   . Statins Other (See Comments)    Feels like knives in stomach  . Surgilube [Gyne-Moistrin]     Rectal itching, burning  . Tape Rash      Medication List       Accurate as of 11/29/16  2:41 PM. Always use your most recent med list.          ALLERGY RELIEF 10 MG tablet Generic drug:  loratadine Take 10 mg by mouth daily.   aspirin 81 MG EC tablet Take 81 mg by mouth daily.   atenolol 25 MG tablet Commonly known as:  TENORMIN TAKE (1/2) TABLET DAILY FOR BLOOD PRESSURE.   Biotin 1000 MCG tablet Take 1,000 mcg by mouth every evening.   brimonidine 0.15 % ophthalmic solution Commonly known as:  ALPHAGAN     brimonidine 0.2 % ophthalmic solution Commonly known as:  ALPHAGAN Place 1 drop into both eyes 3 (three) times daily.   CALTRATE 600+D 600-400 MG-UNIT tablet Generic drug:  Calcium Carbonate-Vitamin D Take 1 tablet by mouth daily.   Fiber Tabs Take 2 tablets by mouth daily.   folic acid 449 MCG tablet Commonly known as:  FOLVITE Take 400 mcg by mouth daily.   IRON PO Take 65 mg by mouth daily.   latanoprost 0.005 % ophthalmic solution Commonly known as:  XALATAN Place 1 drop into both eyes daily.   multivitamin capsule Take 1 capsule by mouth daily.   omeprazole 20 MG capsule Commonly known as:  PRILOSEC TAKE 1 CAPSULE EVERY DAY.   STOOL SOFTENER PO Take 1 capsule by mouth. Colace take one capsule 250 mg twice daily   vitamin B-12 1000 MCG tablet Commonly known as:  CYANOCOBALAMIN Take 1,000 mcg by mouth daily.       Review of Systems:  Review of Systems  Constitutional: Negative for activity change, appetite change, chills, diaphoresis, fatigue, fever and unexpected weight change.  HENT: Positive for ear pain. Negative for congestion, ear discharge, hearing loss, postnasal drip, rhinorrhea, sore throat, tinnitus, trouble swallowing and voice change.   Eyes: Negative.  Negative for pain, discharge, redness, itching and visual disturbance.  Respiratory: Negative for cough, choking, shortness of breath and wheezing.   Cardiovascular: Negative for chest pain, palpitations and leg swelling.  Gastrointestinal: Positive for constipation. Negative for abdominal distention, abdominal pain, diarrhea and nausea.  Endocrine: Negative for cold intolerance, heat intolerance, polydipsia, polyphagia and polyuria.  Genitourinary: Negative for difficulty urinating, dysuria, flank pain, frequency, hematuria, pelvic pain, urgency and vaginal discharge.  Musculoskeletal: Negative for arthralgias, back pain, gait problem, myalgias, neck pain and neck stiffness.  Skin: Negative for  color change, pallor and rash.  Allergic/Immunologic: Negative.   Neurological: Negative for dizziness, tremors, seizures, syncope, weakness, numbness and headaches.  Hematological: Negative for adenopathy. Does not bruise/bleed easily.  Psychiatric/Behavioral: Negative for agitation, behavioral problems, confusion, dysphoric mood, hallucinations, sleep disturbance and suicidal ideas. The patient is not nervous/anxious and is not hyperactive.     Health Maintenance  Topic Date Due  . FOOT EXAM  07/19/1933  . OPHTHALMOLOGY EXAM  07/19/1931  . ZOSTAVAX  07/20/1983  . PNA vac Low Risk Adult (2 of 2 - PCV13) 08/18/2007  . URINE MICROALBUMIN  04/30/2008  . HEMOGLOBIN A1C  12/07/2008  .  TETANUS/TDAP  12/18/2015  . INFLUENZA VACCINE  Completed  . DEXA SCAN  Completed    Physical Exam: Vitals:   11/29/16 1403  BP: 128/60  Pulse: 68  Resp: 20  Temp: 98.4 F (36.9 C)  TempSrc: Oral  Weight: 155 lb (70.3 kg)  Height: 5' 3"  (1.6 m)   Body mass index is 27.46 kg/m. Physical Exam  Constitutional: She is oriented to person, place, and time. She appears well-developed and well-nourished. No distress.  HENT:  Head: Normocephalic and atraumatic.  Mouth/Throat: Oropharynx is clear and moist. No oropharyngeal exudate.  Eyes: Conjunctivae are normal. Pupils are equal, round, and reactive to light.  Neck: Normal range of motion. Neck supple.  Cardiovascular: Normal rate, regular rhythm and normal heart sounds.   Occasional PACs  Pulmonary/Chest: Effort normal and breath sounds normal. No respiratory distress. She has no wheezes. She has no rales.  Abdominal: Soft. Bowel sounds are normal.  Genitourinary: Rectal exam shows guaiac negative stool.  Musculoskeletal: Normal range of motion. She exhibits no edema or tenderness.  Neurological: She is alert and oriented to person, place, and time.  Skin: Skin is warm and dry. She is not diaphoretic.  Left parietal area skin cancer removal scar   Psychiatric: She has a normal mood and affect.    Labs reviewed: Basic Metabolic Panel:  Recent Labs  04/12/16 05/10/16 11/23/16 0730  NA 129* 131* 135  K 4.8 4.5 4.3  CL  --   --  98  CO2  --   --  29  GLUCOSE  --   --  74  BUN 17 17 15   CREATININE 1.1 1.0 1.02*  CALCIUM  --   --  9.7  TSH 3.51  --   --    Liver Function Tests:  Recent Labs  04/12/16  AST 24  ALT 16  ALKPHOS 84   No results for input(s): LIPASE, AMYLASE in the last 8760 hours. No results for input(s): AMMONIA in the last 8760 hours. CBC:  Recent Labs  04/12/16  WBC 5.4  HGB 11.3*  HCT 34*  PLT 308   Lipid Panel:  Recent Labs  04/12/16  CHOL 188  HDL 62  LDLCALC 102  TRIG 122   Lab Results  Component Value Date   HGBA1C 5.5 06/07/2008    Procedures since last visit: No results found.  Assessment/Plan HTN (hypertension) Controlled, continue Atenolol 12.44m daily, update CBC CMP TSH   PSVT (paroxysmal supraventricular tachycardia) Rate is controlled, continue Atenolol.    GERD Has been stable, continue Omeprazole 271mdaily.    Constipation Stable, continue Colace 25048mid and Fiber II tabs daily. Senna  S I po night.    Osteoarthritis Ambulates with walker.   OVERACTIVE BLADDER Urinary leakage.   Anemia takes Vit B12, Folate, iron, 04/12/16 Hgb 11.3  Hyponatremia Improved, Na 135 11/23/16  Osteopenia Bone density done 11/19/16, pending results, ttaking Vit D, Ca.     Labs/tests ordered:  CBC CMP TSH prior to next appointment  Next appt:  9 months.

## 2016-12-31 DIAGNOSIS — L738 Other specified follicular disorders: Secondary | ICD-10-CM | POA: Diagnosis not present

## 2016-12-31 DIAGNOSIS — L570 Actinic keratosis: Secondary | ICD-10-CM | POA: Diagnosis not present

## 2016-12-31 DIAGNOSIS — Z85828 Personal history of other malignant neoplasm of skin: Secondary | ICD-10-CM | POA: Diagnosis not present

## 2017-01-09 ENCOUNTER — Telehealth: Payer: Self-pay

## 2017-01-09 NOTE — Telephone Encounter (Signed)
Called patient with bone density report, per Dr. Nyoka Cowden  No new medication needed. Patient was pleased with report.

## 2017-03-18 DIAGNOSIS — H401131 Primary open-angle glaucoma, bilateral, mild stage: Secondary | ICD-10-CM | POA: Diagnosis not present

## 2017-03-27 ENCOUNTER — Other Ambulatory Visit: Payer: Self-pay | Admitting: Internal Medicine

## 2017-04-02 DIAGNOSIS — I110 Hypertensive heart disease with heart failure: Secondary | ICD-10-CM | POA: Diagnosis not present

## 2017-04-02 DIAGNOSIS — J9601 Acute respiratory failure with hypoxia: Secondary | ICD-10-CM | POA: Diagnosis not present

## 2017-04-02 DIAGNOSIS — R2681 Unsteadiness on feet: Secondary | ICD-10-CM | POA: Diagnosis not present

## 2017-04-02 DIAGNOSIS — Z9181 History of falling: Secondary | ICD-10-CM | POA: Diagnosis not present

## 2017-04-02 DIAGNOSIS — N3946 Mixed incontinence: Secondary | ICD-10-CM | POA: Diagnosis not present

## 2017-04-02 DIAGNOSIS — R69 Illness, unspecified: Secondary | ICD-10-CM | POA: Diagnosis not present

## 2017-04-02 DIAGNOSIS — M79642 Pain in left hand: Secondary | ICD-10-CM | POA: Diagnosis not present

## 2017-04-02 DIAGNOSIS — M2550 Pain in unspecified joint: Secondary | ICD-10-CM | POA: Diagnosis not present

## 2017-04-02 DIAGNOSIS — M79641 Pain in right hand: Secondary | ICD-10-CM | POA: Diagnosis not present

## 2017-04-02 DIAGNOSIS — Z7389 Other problems related to life management difficulty: Secondary | ICD-10-CM | POA: Diagnosis not present

## 2017-04-02 DIAGNOSIS — R29898 Other symptoms and signs involving the musculoskeletal system: Secondary | ICD-10-CM | POA: Diagnosis not present

## 2017-04-02 DIAGNOSIS — I10 Essential (primary) hypertension: Secondary | ICD-10-CM | POA: Diagnosis not present

## 2017-04-02 DIAGNOSIS — J9611 Chronic respiratory failure with hypoxia: Secondary | ICD-10-CM | POA: Diagnosis not present

## 2017-04-02 DIAGNOSIS — G9340 Encephalopathy, unspecified: Secondary | ICD-10-CM | POA: Diagnosis not present

## 2017-04-02 DIAGNOSIS — M6281 Muscle weakness (generalized): Secondary | ICD-10-CM | POA: Diagnosis not present

## 2017-04-02 DIAGNOSIS — R509 Fever, unspecified: Secondary | ICD-10-CM | POA: Diagnosis not present

## 2017-04-02 DIAGNOSIS — E871 Hypo-osmolality and hyponatremia: Secondary | ICD-10-CM | POA: Diagnosis not present

## 2017-04-02 DIAGNOSIS — M25551 Pain in right hip: Secondary | ICD-10-CM | POA: Diagnosis not present

## 2017-04-02 DIAGNOSIS — D509 Iron deficiency anemia, unspecified: Secondary | ICD-10-CM | POA: Diagnosis not present

## 2017-04-02 LAB — BASIC METABOLIC PANEL
BUN: 12 mg/dL (ref 4–21)
Creatinine: 0.9 mg/dL (ref <=1.1)
Glucose: 92 mg/dL
Potassium: 4.6 mmol/L (ref 3.4–5.3)
SODIUM: 126 mmol/L — AB (ref 137–147)

## 2017-04-02 LAB — CBC AND DIFFERENTIAL
HEMATOCRIT: 35 % — AB (ref 36–46)
HEMOGLOBIN: 11.7 g/dL — AB (ref 12.0–16.0)
PLATELETS: 216 10*3/uL (ref 150–399)
WBC: 4.1 10^3/mL

## 2017-04-02 LAB — HEPATIC FUNCTION PANEL
ALT: 17 U/L (ref 7–35)
AST: 24 U/L (ref 13–35)
Alkaline Phosphatase: 94 U/L (ref 25–125)
Bilirubin, Total: 0.4 mg/dL

## 2017-04-03 ENCOUNTER — Encounter: Payer: Self-pay | Admitting: Nurse Practitioner

## 2017-04-03 ENCOUNTER — Non-Acute Institutional Stay: Payer: Medicare HMO | Admitting: Nurse Practitioner

## 2017-04-03 DIAGNOSIS — K5900 Constipation, unspecified: Secondary | ICD-10-CM

## 2017-04-03 DIAGNOSIS — I10 Essential (primary) hypertension: Secondary | ICD-10-CM | POA: Diagnosis not present

## 2017-04-03 DIAGNOSIS — I471 Supraventricular tachycardia: Secondary | ICD-10-CM | POA: Diagnosis not present

## 2017-04-03 DIAGNOSIS — K5731 Diverticulosis of large intestine without perforation or abscess with bleeding: Secondary | ICD-10-CM | POA: Diagnosis not present

## 2017-04-03 DIAGNOSIS — E871 Hypo-osmolality and hyponatremia: Secondary | ICD-10-CM

## 2017-04-03 DIAGNOSIS — K219 Gastro-esophageal reflux disease without esophagitis: Secondary | ICD-10-CM | POA: Diagnosis not present

## 2017-04-03 DIAGNOSIS — C3430 Malignant neoplasm of lower lobe, unspecified bronchus or lung: Secondary | ICD-10-CM | POA: Diagnosis not present

## 2017-04-03 DIAGNOSIS — N3946 Mixed incontinence: Secondary | ICD-10-CM | POA: Diagnosis not present

## 2017-04-03 DIAGNOSIS — Z9181 History of falling: Secondary | ICD-10-CM | POA: Diagnosis not present

## 2017-04-03 DIAGNOSIS — R531 Weakness: Secondary | ICD-10-CM | POA: Diagnosis not present

## 2017-04-03 DIAGNOSIS — R296 Repeated falls: Secondary | ICD-10-CM | POA: Diagnosis not present

## 2017-04-03 DIAGNOSIS — R29898 Other symptoms and signs involving the musculoskeletal system: Secondary | ICD-10-CM | POA: Diagnosis not present

## 2017-04-03 DIAGNOSIS — M6281 Muscle weakness (generalized): Secondary | ICD-10-CM | POA: Diagnosis not present

## 2017-04-03 DIAGNOSIS — R1311 Dysphagia, oral phase: Secondary | ICD-10-CM | POA: Diagnosis not present

## 2017-04-03 DIAGNOSIS — M25552 Pain in left hip: Secondary | ICD-10-CM | POA: Diagnosis not present

## 2017-04-03 DIAGNOSIS — J811 Chronic pulmonary edema: Secondary | ICD-10-CM | POA: Diagnosis not present

## 2017-04-03 DIAGNOSIS — R69 Illness, unspecified: Secondary | ICD-10-CM | POA: Diagnosis not present

## 2017-04-03 DIAGNOSIS — R630 Anorexia: Secondary | ICD-10-CM | POA: Diagnosis not present

## 2017-04-03 DIAGNOSIS — R5381 Other malaise: Secondary | ICD-10-CM | POA: Insufficient documentation

## 2017-04-03 DIAGNOSIS — R5383 Other fatigue: Secondary | ICD-10-CM | POA: Diagnosis not present

## 2017-04-03 DIAGNOSIS — K921 Melena: Secondary | ICD-10-CM | POA: Diagnosis not present

## 2017-04-03 DIAGNOSIS — R2681 Unsteadiness on feet: Secondary | ICD-10-CM | POA: Diagnosis not present

## 2017-04-03 DIAGNOSIS — D500 Iron deficiency anemia secondary to blood loss (chronic): Secondary | ICD-10-CM | POA: Diagnosis not present

## 2017-04-03 NOTE — Progress Notes (Signed)
Location:    Nursing Home Room Number: [address] of Service: [place] [facility] Provider:  [facility] Mast NP  Jeanmarie Hubert, MD  Patient Care Team: Estill Dooms, MD as PCP - General (Internal Medicine) Lanelle Bal, MD as Referring Physician (Internal Medicine) Satira Sark, MD as Consulting Physician (Cardiology) Kyung Rudd, MD as Consulting Physician (Radiation Oncology)  Extended Emergency Contact Information Primary Emergency Contact: Neita Garnet of [place] Phone: [phone] Relation: Son Secondary Emergency Contact: Desha,Lance Address: [address]          [place], [place] [address] [name] of [place] Phone: [phone] Mobile Phone: [phone] Relation: Son  Code Status:  DNR Goals of care: Advanced Directive information Advanced Directives 11/29/2016  Does Patient Have a Medical Advance Directive? Yes  Type of Paramedic of [place];Living will  Does patient want to make changes to medical advance directive? No - Patient declined  Copy of [place] in Chart? Yes  Pre-existing out of facility DNR order (yellow form or pink MOST form) -     Chief Complaint  Patient presents with  . Acute Visit    Low Na    HPI:  Pt is a 82 y.o. female seen today for evaluation of diarrhea x 10 days, no diarrhea in the past 24 hours, fiber, Prilosec, Colace held.  Na 126 04/02/17, generalized weakness, afebrile, denied chest pain, dysuria, nausea, vomiting, or constipation    04/12/16 129, 131 05/10/16, 135 11/23/16, 04/02/17 126. Blood pressure is controlled while on Atenolol 12.27m daily. Takes Vit B12 15374MOLdaily, folic acid 4078MLJdaily, Fe, Hgb 11.7 04/02/17.    Past Medical History:  Diagnosis Date  . Allergic rhinitis   . Anemia    NOS  . Anxiety   . Biceps tendon rupture    Bilateral  . [facility] [place] ([place] [place]   Left s/p lumpectomy and XRT   . Bronchiectasis ([facility] 2014   Noted on chest  x-ray  . Cough 2014  . Depression   . Diverticulosis   . Elevated liver enzymes    Biopsy consistent with steatohepatitis  . Fundic gland polyps of stomach, benign   . GERD (gastroesophageal reflux disease) 06/2007   EGD Dr RGala Romney>[facility] HH, multiple fundic gland polyps  . Glaucoma 2013   both eyes  . Hemorrhoids   . Hiatal hernia   . Hx of radiation therapy 07/17/10 to 07/21/10   SRS LLL lung  . Hyperlipidemia   . Insomnia   . Low back pain    scoliosis  . Lung cancer ([place] 05/2010   Left 2011 - rad x 3  . Lymphedema    Left arm  . Osteoarthritis   . Osteopenia 10/25/2016  . Osteoporosis, senile   . Overactive bladder   . Pneumonia    RLL with sepsis  . PSVT (paroxysmal supraventricular tachycardia) ([facility]   . Rheumatic fever    Age 83 . Right thyroid nodule   . Steatohepatitis    liver bx  . Type 2 diabetes mellitus ([facility]    Past Surgical History:  Procedure Laterality Date  . ABDOMINAL HYSTERECTOMY  1964  . APPENDECTOMY  1964  . BIOPSY THYROID  11/2008  . BREAST BIOPSY     X 3 w/ cystectomy  . BREAST LUMPECTOMY  2001  . CATARACT EXTRACTION Bilateral 2003   Lens implant Dr. ECharise Killian . CHOLECYSTECTOMY  1965  . COLONOSCOPY  09/11/2011   Procedure:  COLONOSCOPY;  Surgeon: Dorothyann Peng, MD;  [facility];  Service: Endoscopy;  Laterality: N/A;  . COLONOSCOPY  2005 /2012   Dr. Lucianne Muss- diverticulosis, ext hemorrhoids.  . CYSTOSCOPY  2007  . ESOPHAGOGASTRODUODENOSCOPY  09/11/2011   Procedure: ESOPHAGOGASTRODUODENOSCOPY (EGD);  Surgeon: Dorothyann Peng, MD;  [facility];  Service: Endoscopy;  Laterality: N/A;  . LIVER BIOPSY  2008  . LUNG BIOPSY Left 06/06/2010  . REVISION TOTAL HIP ARTHROPLASTY  2007   Left  . SKIN CANCER EXCISION     nose and left lower cheek  . TONSILLECTOMY  1930  . TOTAL HIP ARTHROPLASTY Bilateral 2005 & 2007    Dr. Earl Lagos. Aline Brochure     Allergies  Allergen Reactions  . Ibuprofen Hives  . Naproxen   . Statins Other (See  Comments)    Feels like knives in stomach  . Surgilube [Gyne-Moistrin]     Rectal itching, burning  . Tape Rash    Allergies as of 04/03/2017      Reactions   Ibuprofen Hives   Naproxen    Statins Other (See Comments)   Feels like knives in stomach   Surgilube [gyne-moistrin]    Rectal itching, burning   Tape Rash      Medication List       Accurate as of 04/03/17  3:26 PM. Always use your most recent med list.          ALLERGY RELIEF 10 MG tablet Generic drug:  loratadine Take 10 mg by mouth daily.   aspirin 81 MG EC tablet Take 81 mg by mouth daily.   atenolol 25 MG tablet Commonly known as:  TENORMIN TAKE (1/2) TABLET DAILY FOR BLOOD PRESSURE.   Biotin 1000 MCG tablet Take 1,000 mcg by mouth every evening.   brimonidine 0.2 % ophthalmic solution Commonly known as:  ALPHAGAN Place 1 drop into both eyes 3 (three) times daily.   CALTRATE 600+D 600-400 MG-UNIT tablet Generic drug:  Calcium Carbonate-Vitamin D Take 1 tablet by mouth daily.   Fiber Tabs Take 2 tablets by mouth daily.   folic acid 268 MCG tablet Commonly known as:  FOLVITE Take 400 mcg by mouth daily.   IRON PO Take 65 mg by mouth daily.   latanoprost 0.005 % ophthalmic solution Commonly known as:  XALATAN Place 1 drop into both eyes daily.   multivitamin capsule Take 1 capsule by mouth daily.   vitamin B-12 1000 MCG tablet Commonly known as:  CYANOCOBALAMIN Take 1,000 mcg by mouth daily.       Review of Systems  Constitutional: Positive for activity change and appetite change. Negative for chills, diaphoresis, fatigue, fever and unexpected weight change.  HENT: Negative for congestion, ear discharge, ear pain, hearing loss, postnasal drip, rhinorrhea, sore throat, tinnitus, trouble swallowing and voice change.   Eyes: Negative.  Negative for pain, discharge, redness, itching and visual disturbance.  Respiratory: Positive for cough. Negative for choking, shortness of breath and  wheezing.        Chronic hacking cough  Cardiovascular: Negative for chest pain, palpitations and leg swelling.  Gastrointestinal: Positive for diarrhea. Negative for abdominal distention, abdominal pain, constipation and nausea.       Resolved 10 day diarrhea in the past 24 hours.   Endocrine: Negative for cold intolerance, heat intolerance, polydipsia, polyphagia and polyuria.  Genitourinary: Negative for difficulty urinating, dysuria, flank pain, frequency, hematuria, pelvic pain, urgency and vaginal discharge.  Musculoskeletal: Negative for arthralgias, back pain, gait  problem, myalgias, neck pain and neck stiffness.  Skin: Negative for color change, pallor and rash.  Allergic/Immunologic: Negative.   Neurological: Negative for dizziness, tremors, seizures, syncope, weakness, numbness and headaches.  Hematological: Negative for adenopathy. Does not bruise/bleed easily.  Psychiatric/Behavioral: Negative for agitation, behavioral problems, confusion, dysphoric mood, hallucinations, sleep disturbance and suicidal ideas. The patient is not nervous/anxious and is not hyperactive.     Immunization History  Administered Date(s) Administered  . Influenza Whole 09/22/2008, 09/16/2012, 09/30/2013  . Influenza-Unspecified 10/02/2014, 09/15/2015, 09/27/2016  . Pneumococcal Polysaccharide-23 08/17/2006  . Td 12/17/2005   Pertinent  Health Maintenance Due  Topic Date Due  . FOOT EXAM  07[birth year]19[birth year]1933  . OPHTHALMOLOGY EXAM  07[birth year]19/1931  . PNA vac Low Risk Adult (2 of 2 - PCV13) 08/18/2007  . URINE MICROALBUMIN  04/30/2008  . HEMOGLOBIN A1C  12/07/2008  . INFLUENZA VACCINE  07/17/2017  . DEXA SCAN  Completed   Fall Risk  05/17/2016 04/20/2016 04/19/2016  Falls in the past year? No No No   Functional Status Survey:    Vitals:   04/03/17 1459  BP: 110/60  Pulse: 66  Resp: 18  Temp: 97.4 F (36.3 C)  SpO2: 96%  Weight: 155 lb (70.3 kg)  Height: 5' 3"  (1.6 m)   Body mass index is 27.46  kg/m. Physical Exam  Constitutional: She is oriented to person, place, and time. She appears well-developed and well-nourished. No distress.  HENT:  Head: Normocephalic and atraumatic.  Mouth/Throat: Oropharynx is clear and moist. No oropharyngeal exudate.  Eyes: Conjunctivae are normal. Pupils are equal, round, and reactive to light.  Neck: Normal range of motion. Neck supple.  Cardiovascular: Normal rate, regular rhythm and normal heart sounds.   Occasional PACs  Pulmonary/Chest: Effort normal and breath sounds normal. No respiratory distress. She has no wheezes. She has no rales.  Abdominal: Soft. Bowel sounds are normal.  Genitourinary: Rectal exam shows guaiac negative stool.  Musculoskeletal: Normal range of motion. She exhibits no edema or tenderness.  Neurological: She is alert and oriented to person, place, and time.  Skin: Skin is warm and dry. She is not diaphoretic.  Left parietal area skin cancer removal scar  Psychiatric: She has a normal mood and affect.    Labs reviewed:  Recent Labs  04/12/16 05/10/16 11/23/16 0730  NA 129* 131* 135  K 4.8 4.5 4.3  CL  --   --  98  CO2  --   --  29  GLUCOSE  --   --  74  BUN 17 17 15   CREATININE 1.1 1.0 1.02*  CALCIUM  --   --  9.7    Recent Labs  04/12/16  AST 24  ALT 16  ALKPHOS 84    Recent Labs  04/12/16  WBC 5.4  HGB 11.3*  HCT 34*  PLT 308   Lab Results  Component Value Date   TSH 3.51 04/12/2016   Lab Results  Component Value Date   HGBA1C 5.5 06/07/2008   Lab Results  Component Value Date   CHOL 188 04/12/2016   HDL 62 04/12/2016   LDLCALC 102 04/12/2016   TRIG 122 04/12/2016   CHOLHDL 3.0 Ratio 06/07/2008    Significant Diagnostic Results in last 30 days:  No results found.  Assessment/Plan  Hyponatremia 06/21/15 Na 132 10/20/15 Na 134 04/12/16 Na 129 05/10/16 Na 131 11/23/16 Na 135 04/02/17 Na 126 04/03/17 D5 NS 75cc/hr x 2041m IVF, BMP upon completion of IVF  Constipation Dc Colace  and dietary fiber, no loose stools vs diarrhea, observe the patient.   Decrease in appetite Mirtazapine 7.20m qhs, 04/02/17 Na 126, K 4.6, Bun 12, creat 0.90, wbc 4.1, Hgb 11.7, plt 216. Obtain UA C/S. D5 NS 75cc/hr x 20074m BMP upon completion of IVF  Generalized weakness Diarrhea x 10days, poor oral intake, Na 126 contributory, encourage oral intake, IVF for now. UA C/S to r/o UTI, observe.   GERD Resume Omeprazole 2067maily. Held for diarrhea.   HTN (hypertension) Controlled, continue Atenolol 12.5mg78mily  PSVT (paroxysmal supraventricular tachycardia) Heart rate is in control.   Lung cancer, lower lobe (HCC[place].   Anemia 04/12/16 Hgb 11.3 04/02/17 Hgb 11.7 Continue B12, Folate, Fe   Family/ staff Communication: may [place] when she is able.   Labs/tests ordered: UA C/S, BMP upon completion of IVF

## 2017-04-03 NOTE — Assessment & Plan Note (Signed)
CXR to evaluate further.

## 2017-04-03 NOTE — Assessment & Plan Note (Signed)
Resume Omeprazole 36m daily. Held for diarrhea.

## 2017-04-03 NOTE — Assessment & Plan Note (Signed)
Heart rate is in control.

## 2017-04-03 NOTE — Assessment & Plan Note (Signed)
Diarrhea x 10days, poor oral intake, Na 126 contributory, encourage oral intake, IVF for now. UA C/S to r/o UTI, observe.

## 2017-04-03 NOTE — Assessment & Plan Note (Signed)
Mirtazapine 7.67m qhs, 04/02/17 Na 126, K 4.6, Bun 12, creat 0.90, wbc 4.1, Hgb 11.7, plt 216. Obtain UA C/S. D5 NS 75cc/hr x 20086m BMP upon completion of IVF

## 2017-04-03 NOTE — Assessment & Plan Note (Signed)
04/12/16 Hgb 11.3 04/02/17 Hgb 11.7 Continue B12, Folate, Fe

## 2017-04-03 NOTE — Assessment & Plan Note (Signed)
06/21/15 Na 132 10/20/15 Na 134 04/12/16 Na 129 05/10/16 Na 131 11/23/16 Na 135 04/02/17 Na 126 04/03/17 D5 NS 75cc/hr x 2059m IVF, BMP upon completion of IVF

## 2017-04-03 NOTE — Assessment & Plan Note (Signed)
Controlled, continue Atenolol 12.75m daily

## 2017-04-03 NOTE — Assessment & Plan Note (Signed)
Dc Colace and dietary fiber, no loose stools vs diarrhea, observe the patient.

## 2017-04-04 DIAGNOSIS — D509 Iron deficiency anemia, unspecified: Secondary | ICD-10-CM | POA: Diagnosis not present

## 2017-04-04 LAB — BASIC METABOLIC PANEL
BUN: 6 mg/dL (ref 4–21)
Creatinine: 0.9 mg/dL (ref <=1.1)
Glucose: 120 mg/dL
POTASSIUM: 4.2 mmol/L (ref 3.4–5.3)
Sodium: 132 mmol/L — AB (ref 137–147)

## 2017-04-08 ENCOUNTER — Encounter: Payer: Self-pay | Admitting: Nurse Practitioner

## 2017-04-08 ENCOUNTER — Non-Acute Institutional Stay (SKILLED_NURSING_FACILITY): Payer: Medicare HMO | Admitting: Nurse Practitioner

## 2017-04-08 DIAGNOSIS — K5900 Constipation, unspecified: Secondary | ICD-10-CM

## 2017-04-08 DIAGNOSIS — I10 Essential (primary) hypertension: Secondary | ICD-10-CM | POA: Diagnosis not present

## 2017-04-08 DIAGNOSIS — K219 Gastro-esophageal reflux disease without esophagitis: Secondary | ICD-10-CM

## 2017-04-08 DIAGNOSIS — E871 Hypo-osmolality and hyponatremia: Secondary | ICD-10-CM

## 2017-04-08 DIAGNOSIS — F411 Generalized anxiety disorder: Secondary | ICD-10-CM

## 2017-04-08 DIAGNOSIS — D500 Iron deficiency anemia secondary to blood loss (chronic): Secondary | ICD-10-CM | POA: Diagnosis not present

## 2017-04-08 DIAGNOSIS — M159 Polyosteoarthritis, unspecified: Secondary | ICD-10-CM

## 2017-04-08 DIAGNOSIS — N318 Other neuromuscular dysfunction of bladder: Secondary | ICD-10-CM

## 2017-04-08 DIAGNOSIS — M8949 Other hypertrophic osteoarthropathy, multiple sites: Secondary | ICD-10-CM

## 2017-04-08 DIAGNOSIS — I471 Supraventricular tachycardia: Secondary | ICD-10-CM

## 2017-04-08 DIAGNOSIS — R69 Illness, unspecified: Secondary | ICD-10-CM | POA: Diagnosis not present

## 2017-04-08 DIAGNOSIS — M150 Primary generalized (osteo)arthritis: Secondary | ICD-10-CM

## 2017-04-08 NOTE — Assessment & Plan Note (Signed)
Controlled, continue Atenolol 12.108m daily

## 2017-04-08 NOTE — Assessment & Plan Note (Signed)
Off Colace and dietary fiber, no loose stools vs diarrhea, observe the patient.

## 2017-04-08 NOTE — Assessment & Plan Note (Addendum)
Na 126 04/02/17, generalized weakness, afebrile, denied chest pain, dysuria, nausea, vomiting, or constipation, s/p IVF D5 NS 1043m, Na 132 04/04/17, K 4.2, Bun 6, creat 0.98. Update BMP

## 2017-04-08 NOTE — Assessment & Plan Note (Signed)
Heart rate is in control.

## 2017-04-08 NOTE — Progress Notes (Signed)
Location:    Nursing Home Room Number: 30 Place of Service: [facility] Provider:  [facility] Markiyah Gahm NP  Jeanmarie Hubert, MD  Patient Care Team: Estill Dooms, MD as PCP - General (Internal Medicine) Lanelle Bal, MD as Referring Physician (Internal Medicine) Satira Sark, MD as Consulting Physician (Cardiology) Kyung Rudd, MD as Consulting Physician (Radiation Oncology)  Extended Emergency Contact Information Primary Emergency Contact: Neita Garnet of [place] Phone: [phone] Relation: Son Secondary Emergency Contact: Hults,Lance Address: [address]          [place], [place] [address] [name] of [place] Phone: [phone] Mobile Phone: [phone] Relation: Son  Code Status:  DNR Goals of care: Advanced Directive information Advanced Directives 04/08/2017  Does Patient Have a Medical Advance Directive? Yes  Type of Advance Directive Living will  Does patient want to make changes to medical advance directive? No - Patient declined  Copy of [place] in Chart? -  Pre-existing out of facility DNR order (yellow form or pink MOST form) -     Chief Complaint  Patient presents with  . Acute Visit    Family(son) has questions & concerns regarding his mother     HPI:  Pt is a 83 y.o. female seen today for evaluate Na 126 04/02/17, generalized weakness, afebrile, denied chest pain, dysuria, nausea, vomiting, or constipation, s/p IVF D5 NS 1026m, Na 132 04/04/17, K 4.2, Bun 6, creat 0.98    Blood pressure is controlled while on Atenolol 12.584mdaily. Takes Vit B12 104656CLEaily, folic acid 40751ZGYaily, Fe, Hgb 11.7 04/02/17.    Past Medical History:  Diagnosis Date  . Allergic rhinitis   . Anemia    NOS  . Anxiety   . Biceps tendon rupture    Bilateral  . Breast cancer ([facility]   Left s/p lumpectomy and XRT   . Bronchiectasis ([facility]2014   Noted on chest x-ray  . Cough 2014  . Depression   . Diverticulosis   .  Elevated liver enzymes    Biopsy consistent with steatohepatitis  . Fundic gland polyps of stomach, benign   . GERD (gastroesophageal reflux disease) 06/2007   EGD Dr RoGala Romneysm [facility], multiple fundic gland polyps  . Glaucoma 2013   both eyes  . Hemorrhoids   . Hiatal hernia   . Hx of radiation therapy 07/17/10 to 07/21/10   SRS LLL lung  . Hyperlipidemia   . Insomnia   . Low back pain    scoliosis  . Lung cancer ([facility]6/2011   Left 2011 - rad x 3  . Lymphedema    Left arm  . Osteoarthritis   . Osteopenia 10/25/2016  . Osteoporosis, senile   . Overactive bladder   . Pneumonia    RLL with sepsis  . PSVT (paroxysmal supraventricular tachycardia) ([facility]  . Rheumatic fever    Age 83. Right thyroid nodule   . Steatohepatitis    liver bx  . Type 2 diabetes mellitus ([facility] [place]   Past Surgical History:  Procedure Laterality Date  . ABDOMINAL HYSTERECTOMY  1964  . APPENDECTOMY  1964  . BIOPSY THYROID  11/2008  . BREAST BIOPSY     X 3 w/ cystectomy  . BREAST LUMPECTOMY  2001  . CATARACT EXTRACTION Bilateral 2003   Lens implant Dr. EpCharise Killian. CHOLECYSTECTOMY  1965  . COLONOSCOPY  09/11/2011   Procedure: COLONOSCOPY;  Surgeon: SaDorothyann PengMD;  [facility];  Service: Endoscopy;  Laterality: N/A;  . COLONOSCOPY  2005 /2012   Dr. Lucianne Muss- diverticulosis, ext hemorrhoids.  . CYSTOSCOPY  2007  . ESOPHAGOGASTRODUODENOSCOPY  09/11/2011   Procedure: ESOPHAGOGASTRODUODENOSCOPY (EGD);  Surgeon: Dorothyann Peng, MD;  Location[facility];  Service: Endoscopy;  Laterality: N/A;  . LIVER BIOPSY  2008  . LUNG BIOPSY Left 06/06/2010  . REVISION TOTAL HIP ARTHROPLASTY  2007   Left  . SKIN CANCER EXCISION     nose and left lower cheek  . TONSILLECTOMY  1930  . TOTAL HIP ARTHROPLASTY Bilateral 2005 & 2007    Dr. Earl Lagos. Aline Brochure     Allergies  Allergen Reactions  . Ibuprofen Hives  . Naproxen   . Statins Other (See Comments)    Feels like knives in stomach  . Surgilube  [Gyne-Moistrin]     Rectal itching, burning  . Tape Rash    Allergies as of 04/08/2017      Reactions   Ibuprofen Hives   Naproxen    Statins Other (See Comments)   Feels like knives in stomach   Surgilube [gyne-moistrin]    Rectal itching, burning   Tape Rash      Medication List       Accurate as of 04/08/17  1:37 PM. Always use your most recent med list.          ALLERGY RELIEF 10 MG tablet Generic drug:  loratadine Take 10 mg by mouth daily.   aspirin 81 MG EC tablet Take 81 mg by mouth daily.   atenolol 25 MG tablet Commonly known as:  TENORMIN TAKE (1/2) TABLET DAILY FOR BLOOD PRESSURE.   Biotin 1000 MCG tablet Take 1,000 mcg by mouth every evening.   brimonidine 0.2 % ophthalmic solution Commonly known as:  ALPHAGAN Place 1 drop into both eyes 3 (three) times daily.   CALTRATE 600+D 600-400 MG-UNIT tablet Generic drug:  Calcium Carbonate-Vitamin D Take 1 tablet by mouth daily.   Fiber Tabs Take 2 tablets by mouth daily.   folic acid 818 MCG tablet Commonly known as:  FOLVITE Take 400 mcg by mouth daily.   IRON PO Take 65 mg by mouth daily.   ketotifen 0.025 % ophthalmic solution Commonly known as:  ZADITOR Place 1 drop into both eyes daily as needed.   latanoprost 0.005 % ophthalmic solution Commonly known as:  XALATAN Place 1 drop into both eyes daily.   multivitamin capsule Take 1 capsule by mouth daily.   vitamin B-12 1000 MCG tablet Commonly known as:  CYANOCOBALAMIN Take 1,000 mcg by mouth daily.       Review of Systems  Constitutional: Positive for activity change and appetite change. Negative for chills, diaphoresis, fatigue, fever and unexpected weight change.  HENT: Negative for congestion, ear discharge, ear pain, hearing loss, postnasal drip, rhinorrhea, sore throat, tinnitus, trouble swallowing and voice change.   Eyes: Negative.  Negative for pain, discharge, redness, itching and visual disturbance.  Respiratory: Positive  for cough. Negative for choking, shortness of breath and wheezing.        Chronic hacking cough  Cardiovascular: Negative for chest pain, palpitations and leg swelling.  Gastrointestinal: Positive for diarrhea. Negative for abdominal distention, abdominal pain, constipation and nausea.       Resolved 10 day diarrhea in the past 24 hours.   Endocrine: Negative for cold intolerance, heat intolerance, polydipsia, polyphagia and polyuria.  Genitourinary: Negative for difficulty urinating, dysuria, flank pain, frequency, hematuria, pelvic  pain, urgency and vaginal discharge.  Musculoskeletal: Positive for arthralgias and gait problem. Negative for back pain, myalgias, neck pain and neck stiffness.       Left knee and foot pain, no warmth or significant swelling, pain worsens with walking.   Skin: Negative for color change, pallor and rash.  Allergic/Immunologic: Negative.   Neurological: Negative for dizziness, tremors, seizures, syncope, weakness, numbness and headaches.  Hematological: Negative for adenopathy. Does not bruise/bleed easily.  Psychiatric/Behavioral: Negative for agitation, behavioral problems, confusion, dysphoric mood, hallucinations, sleep disturbance and suicidal ideas. The patient is not nervous/anxious and is not hyperactive.     Immunization History  Administered Date(s) Administered  . Influenza Whole 09/22/2008, 09/16/2012, 09/30/2013  . Influenza-Unspecified 10/02/2014, 09/15/2015, 09/27/2016  . Pneumococcal Polysaccharide-23 08/17/2006  . Td 12/17/2005   Pertinent  Health Maintenance Due  Topic Date Due  . FOOT EXAM  07/19/1931  . OPHTHALMOLOGY EXAM  07/19/1931  . PNA vac Low Risk Adult (2 of 2 - PCV13) 08/18/2007  . URINE MICROALBUMIN  04/30/2008  . HEMOGLOBIN A1C  12/07/2008  . INFLUENZA VACCINE  07/17/2017  . DEXA SCAN  Completed   Fall Risk  05/17/2016 04/20/2016 04/19/2016  Falls in the past year? No No No   Functional Status Survey:    Vitals:   04/08/17  1047  BP: 140/70  Pulse: 60  Resp: 18  Temp: 98.7 F (37.1 C)  Weight: 153 lb (69.4 kg)  Height: 5' 3"  (1.6 m)   Body mass index is 27.1 kg/m. Physical Exam  Constitutional: She is oriented to person, place, and time. She appears well-developed and well-nourished. No distress.  HENT:  Head: Normocephalic and atraumatic.  Mouth/Throat: Oropharynx is clear and moist. No oropharyngeal exudate.  Eyes: Conjunctivae are normal. Pupils are equal, round, and reactive to light.  Neck: Normal range of motion. Neck supple.  Cardiovascular: Normal rate, regular rhythm and normal heart sounds.   Occasional PACs  Pulmonary/Chest: Effort normal and breath sounds normal. No respiratory distress. She has no wheezes. She has no rales.  Abdominal: Soft. Bowel sounds are normal.  Genitourinary: Rectal exam shows guaiac negative stool.  Musculoskeletal: Normal range of motion. She exhibits no edema or tenderness.  Neurological: She is alert and oriented to person, place, and time.  Skin: Skin is warm and dry. She is not diaphoretic.  Left parietal area skin cancer removal scar  Psychiatric: She has a normal mood and affect.    Labs reviewed:  Recent Labs  11/23/16 0730 04/02/17 04/04/17  NA 135 126* 132*  K 4.3 4.6 4.2  CL 98  --   --   CO2 29  --   --   GLUCOSE 74  --   --   BUN 15 12 6   CREATININE 1.02* 0.9 0.9  CALCIUM 9.7  --   --     Recent Labs  04/12/16 04/02/17  AST 24 24  ALT 16 17  ALKPHOS 84 94    Recent Labs  04/12/16 04/02/17  WBC 5.4 4.1  HGB 11.3* 11.7*  HCT 34* 35*  PLT 308 216   Lab Results  Component Value Date   TSH 3.51 04/12/2016   Lab Results  Component Value Date   HGBA1C 5.5 06/07/2008   Lab Results  Component Value Date   CHOL 188 04/12/2016   HDL 62 04/12/2016   LDLCALC 102 04/12/2016   TRIG 122 04/12/2016   CHOLHDL 3.0 Ratio 06/07/2008    Significant Diagnostic Results in last 30  days:  No results  found.  Assessment/Plan  Hyponatremia Na 126 04/02/17, generalized weakness, afebrile, denied chest pain, dysuria, nausea, vomiting, or constipation, s/p IVF D5 NS 1059m, Na 132 04/04/17, K 4.2, Bun 6, creat 0.98. Update BMP    OVERACTIVE BLADDER Urinary leakage.   Anemia Stable, Hgb 11.7, Continue B12, Folate, Fe  Anxiety state Feeling low, adding Mirtazapine 7.571mpo qhs to improve sleep, appetite, mood.   HTN (hypertension) Controlled, continue Atenolol 12.71m42maily  PSVT (paroxysmal supraventricular tachycardia) Heart rate is in control.   GERD Resume Omeprazole 42m43mily.   Constipation Off Colace and dietary fiber, no loose stools vs diarrhea, observe the patient.   Osteoarthritis Left knee and foot pain with walking, no warmth or significant swelling, will try Tylenol 650mg57m, observe the patient.  PT to eval and treat as indicated.    Family/ staff Communication: SNF vs AL  Labs/tests ordered: BMP

## 2017-04-08 NOTE — Assessment & Plan Note (Addendum)
Resume Omeprazole 67m daily.

## 2017-04-08 NOTE — Assessment & Plan Note (Signed)
Feeling low, adding Mirtazapine 7.45m po qhs to improve sleep, appetite, mood.

## 2017-04-08 NOTE — Assessment & Plan Note (Signed)
Stable, Hgb 11.7, Continue B12, Folate, Fe

## 2017-04-08 NOTE — Assessment & Plan Note (Signed)
Urinary leakage.

## 2017-04-08 NOTE — Assessment & Plan Note (Addendum)
Left knee and foot pain with walking, no warmth or significant swelling, will try Tylenol 656m bid, observe the patient.  PT to eval and treat as indicated.

## 2017-04-12 ENCOUNTER — Non-Acute Institutional Stay (SKILLED_NURSING_FACILITY): Payer: Medicare HMO | Admitting: Internal Medicine

## 2017-04-12 ENCOUNTER — Encounter: Payer: Self-pay | Admitting: Internal Medicine

## 2017-04-12 DIAGNOSIS — R531 Weakness: Secondary | ICD-10-CM

## 2017-04-12 DIAGNOSIS — D510 Vitamin B12 deficiency anemia due to intrinsic factor deficiency: Secondary | ICD-10-CM | POA: Diagnosis not present

## 2017-04-12 DIAGNOSIS — R197 Diarrhea, unspecified: Secondary | ICD-10-CM

## 2017-04-12 DIAGNOSIS — I10 Essential (primary) hypertension: Secondary | ICD-10-CM | POA: Diagnosis not present

## 2017-04-12 DIAGNOSIS — E871 Hypo-osmolality and hyponatremia: Secondary | ICD-10-CM | POA: Diagnosis not present

## 2017-04-12 DIAGNOSIS — E1165 Type 2 diabetes mellitus with hyperglycemia: Secondary | ICD-10-CM | POA: Diagnosis not present

## 2017-04-12 NOTE — Progress Notes (Signed)
Provider:  Jeanmarie Hubert MD Location:  [place] Room Number: N50 Place of Service:  SNF [phone])  PCP: Jeanmarie Hubert, MD Patient Care Team: Estill Dooms, MD as PCP - General (Internal Medicine) Lanelle Bal, MD as Referring Physician (Internal Medicine) Satira Sark, MD as Consulting Physician (Cardiology) Kyung Rudd, MD as Consulting Physician (Radiation Oncology)  Extended Emergency Contact Information Primary Emergency Contact: Neita Garnet of [place] Phone: [phone] Relation: Son Secondary Emergency Contact: More,Lance Address: [address]          [place], [place] [address] [name] of [place] Phone: [phone] Mobile Phone: [phone] Relation: Son  Code Status: Full Code Goals of Care: Advanced Directive information Advanced Directives 04/12/2017  Does Patient Have a Medical Advance Directive? Yes  Type of Paramedic of [place];Living will  Does patient want to make changes to medical advance directive? -  Copy of [place] in Chart? Yes  Pre-existing out of facility DNR order (yellow form or pink MOST form) -      Chief Complaint  Patient presents with  . New Admit To SNF    transfer from [facility] AL 04/03/17 for hydration therapy.     HPI: Patient is a 83 y.o. female seen today for admission to [facility] SNF from [facility] AL on 04/03/17 to receive IV fluids. She had 10 days of diarrhea while in the AL. Notes from 04/08/17 discuss a rise in the Na+ level from 126 to 132 following administration of D5NS 1000 ml. Her weakness and diarrhea are improved since admission to the SNF.  Past Medical History:  Diagnosis Date  . Allergic rhinitis   . Anemia    NOS  . Anxiety   . Biceps tendon rupture    Bilateral  . Breast cancer ([place]) 2001   Left s/p lumpectomy and XRT   . Bronchiectasis ([place]) 2014   Noted on chest x-ray  . Cough 2014  . Depression   . Diverticulosis     . Elevated liver enzymes    Biopsy consistent with steatohepatitis  . Fundic gland polyps of stomach, benign   . GERD (gastroesophageal reflux disease) 06/2007   EGD Dr Gala Romney >sm HH, multiple fundic gland polyps  . Glaucoma 2013   both eyes  . Hemorrhoids   . Hiatal hernia   . Hx of radiation therapy 07/17/10 to 07/21/10   SRS LLL lung  . Hyperlipidemia   . Insomnia   . Low back pain    scoliosis  . Lung cancer ([place]) 05/2010   Left 2011 - rad x 3  . Lymphedema    Left arm  . Osteoarthritis   . Osteopenia 10/25/2016  . Osteoporosis, senile   . Overactive bladder   . Pneumonia    RLL with sepsis  . PSVT (paroxysmal supraventricular tachycardia) ([place])   . Rheumatic fever    Age 65  . Right thyroid nodule   . Steatohepatitis    liver bx  . Type 2 diabetes mellitus ([place])    Past Surgical History:  Procedure Laterality Date  . ABDOMINAL HYSTERECTOMY  1964  . APPENDECTOMY  1964  . BIOPSY THYROID  11/2008  . BREAST BIOPSY     X 3 w/ cystectomy  . BREAST LUMPECTOMY  2001  . CATARACT EXTRACTION Bilateral 2003   Lens implant Dr. Charise Killian  . CHOLECYSTECTOMY  1965  . COLONOSCOPY  09/11/2011   Procedure: COLONOSCOPY;  Surgeon: Hope Pigeon  Fields, MD;  Location[facility];  Service: Endoscopy;  Laterality: N/A;  . COLONOSCOPY  2005 /2012   Dr. Lucianne Muss- diverticulosis, ext hemorrhoids.  . CYSTOSCOPY  2007  . ESOPHAGOGASTRODUODENOSCOPY  09/11/2011   Procedure: ESOPHAGOGASTRODUODENOSCOPY (EGD);  Surgeon: Dorothyann Peng, MD;  [facility];  Service: Endoscopy;  Laterality: N/A;  . LIVER BIOPSY  2008  . LUNG BIOPSY Left 06/06/2010  . REVISION TOTAL HIP ARTHROPLASTY  2007   Left  . SKIN CANCER EXCISION     nose and left lower cheek  . TONSILLECTOMY  1930  . TOTAL HIP ARTHROPLASTY Bilateral 2005 & 2007    Dr. Earl Lagos. Aline Brochure     reports that she has never smoked. She has never used smokeless tobacco. She reports that she does not drink alcohol or use drugs. Social History    Social History  . Marital status: Widowed    Spouse name: N/A  . Number of children: 2  . Years of education: college    Occupational History  . retired Tax Therapist, nutritional Retired   Social History Main Topics  . Smoking status: Never Smoker  . Smokeless tobacco: Never Used     Comment: 2nd hand-husband  . Alcohol use No  . Drug use: No  . Sexual activity: Not Currently   Other Topics Concern  . Not on file   Social History Narrative   Lives at [facility] 02/2013   2 adopted children   Was in [place], [place]   Widowed 2013   Walks with walker   Exercise: none    POA - Living Will     Family History  Problem Relation Age of Onset  . Heart failure Mother   . Osteoporosis Mother   . Hypertension Father   . Stroke Father   . Heart failure Father   . Leukemia Brother   . Heart failure Brother   . Cancer Maternal Grandmother   . Stroke Maternal Grandfather   . Cancer      Health Maintenance  Topic Date Due  . FOOT EXAM  07/19/1931  . OPHTHALMOLOGY EXAM  07/19/1931  . PNA vac Low Risk Adult (2 of 2 - PCV13) 08/18/2007  . URINE MICROALBUMIN  04/30/2008  . HEMOGLOBIN A1C  12/07/2008  . TETANUS/TDAP  12/18/2015  . INFLUENZA VACCINE  07/17/2017  . DEXA SCAN  Completed    Allergies  Allergen Reactions  . Ibuprofen Hives  . Naproxen   . Statins Other (See Comments)    Feels like knives in stomach  . Surgilube [Gyne-Moistrin]     Rectal itching, burning  . Tape Rash    Outpatient Encounter Prescriptions as of 04/12/2017  Medication Sig  . acetaminophen (TYLENOL) 325 MG tablet Take 650 mg by mouth. Take 2 tablets every 6 hours as needed  . aspirin 81 MG EC tablet Take 81 mg by mouth daily.    Marland Kitchen atenolol (TENORMIN) 25 MG tablet TAKE (1/2) TABLET DAILY FOR BLOOD PRESSURE.  Marland Kitchen Biotin 1000 MCG tablet Take 1,000 mcg by mouth every evening.   . brimonidine (ALPHAGAN) 0.2 % ophthalmic solution Place 1 drop into both eyes 3 (three) times daily.  . Calcium  Carbonate-Vitamin D (CALTRATE 600+D) 600-400 MG-UNIT per tablet Take 1 tablet by mouth daily.    Marland Kitchen Dextromethorphan-Guaifenesin (ROBAFEN DM) 10-100 MG/5ML liquid Take 5 mLs by mouth. 10 ml by mouth every 6 hours as need for cough in 48 hours  . dorzolamide-timolol (COSOPT) 22.3-6.8 MG/ML ophthalmic  solution 1 drop. One drop in each eye twice daily  . folic acid (FOLVITE) 563 MCG tablet Take 400 mcg by mouth daily.    . IRON PO Take 65 mg by mouth daily.   Marland Kitchen ketotifen (ZADITOR) 0.025 % ophthalmic solution Place 1 drop into both eyes daily as needed.  . lactose free nutrition (BOOST) LIQD Take 237 mLs by mouth. Serve in cup over ice twice a day between meals  . latanoprost (XALATAN) 0.005 % ophthalmic solution Place 1 drop into both eyes daily.   Marland Kitchen loratadine (ALLERGY RELIEF) 10 MG tablet Take 10 mg by mouth daily.   . magnesium hydroxide (MILK OF MAGNESIA) 400 MG/5ML suspension Take by mouth. 30 ml once a daily as needed for constipation  . mirtazapine (REMERON) 7.5 MG tablet Take 7.5 mg by mouth. Take one tablet at bedtime  . Multiple Vitamin (MULTIVITAMIN) capsule Take 1 capsule by mouth daily.   . Multiple Vitamins-Minerals (PRESERVISION AREDS 2 PO) Take by mouth. Take one capsule twice a day for vision  . omeprazole (PRILOSEC) 20 MG capsule Take 20 mg by mouth. Take one tablet daily  . vitamin B-12 (CYANOCOBALAMIN) 1000 MCG tablet Take 1,000 mcg by mouth daily.  . [DISCONTINUED] Fiber TABS Take 2 tablets by mouth daily.    No facility-administered encounter medications on file as of 04/12/2017.     Review of Systems  Constitutional: Positive for activity change, appetite change and fatigue. Negative for chills, diaphoresis, fever and unexpected weight change.  HENT: Negative for congestion, ear discharge, ear pain, hearing loss, postnasal drip, rhinorrhea, sore throat, tinnitus, trouble swallowing and voice change.   Eyes: Negative.  Negative for pain, discharge, redness, itching and visual  disturbance.  Respiratory: Negative for cough, choking, shortness of breath and wheezing.   Cardiovascular: Negative for chest pain, palpitations and leg swelling.  Gastrointestinal: Negative for abdominal distention, abdominal pain, constipation, diarrhea and nausea.       Resolved 10 day diarrhea in the past 24 hours.   Endocrine: Negative for cold intolerance, heat intolerance, polydipsia, polyphagia and polyuria.  Genitourinary: Negative for difficulty urinating, dysuria, flank pain, frequency, hematuria, pelvic pain, urgency and vaginal discharge.  Musculoskeletal: Positive for arthralgias and gait problem. Negative for back pain, myalgias, neck pain and neck stiffness.       Left knee and foot pain, no warmth or significant swelling, pain worsens with walking.   Skin: Negative for color change, pallor and rash.  Allergic/Immunologic: Negative.   Neurological: Negative for dizziness, tremors, seizures, syncope, weakness, numbness and headaches.  Hematological: Negative for adenopathy. Does not bruise/bleed easily.  Psychiatric/Behavioral: Negative for agitation, behavioral problems, confusion, dysphoric mood, hallucinations, sleep disturbance and suicidal ideas. The patient is not nervous/anxious and is not hyperactive.     Vitals:   04/12/17 1105  BP: 120/76  Pulse: 72  Resp: (!) 22  Temp: 98.7 F (37.1 C)  Weight: 151 lb 6.4 oz (68.7 kg)  Height: 5' 5"  (1.651 m)   Body mass index is 25.19 kg/m. Physical Exam  Constitutional: She is oriented to person, place, and time. She appears well-developed and well-nourished. No distress.  HENT:  Head: Normocephalic and atraumatic.  Mouth/Throat: Oropharynx is clear and moist. No oropharyngeal exudate.  Hearing loss  Eyes: Pupils are equal, round, and reactive to light. Conjunctivae are normal.  Neck: Normal range of motion. Neck supple.  Cardiovascular: Normal rate, regular rhythm and normal heart sounds.   Occasional PACs    Pulmonary/Chest: Effort normal and breath  sounds normal. No respiratory distress. She has no wheezes. She has no rales.  Abdominal: Soft. Bowel sounds are normal.  Genitourinary: Rectal exam shows guaiac negative stool.  Musculoskeletal: Normal range of motion. She exhibits no edema or tenderness.  Neurological: She is alert and oriented to person, place, and time.  Skin: Skin is warm and dry. She is not diaphoretic.  Left parietal area skin cancer removal scar  Psychiatric: She has a normal mood and affect.    Labs reviewed: Basic Metabolic Panel:  Recent Labs  11/23/16 0730 04/02/17 04/04/17  NA 135 126* 132*  K 4.3 4.6 4.2  CL 98  --   --   CO2 29  --   --   GLUCOSE 74  --   --   BUN 15 12 6   CREATININE 1.02* 0.9 0.9  CALCIUM 9.7  --   --    Liver Function Tests:  Recent Labs  04/02/17  AST 24  ALT 17  ALKPHOS 94   No results for input(s): LIPASE, AMYLASE in the last 8760 hours. No results for input(s): AMMONIA in the last 8760 hours. CBC:  Recent Labs  04/02/17  WBC 4.1  HGB 11.7*  HCT 35*  PLT 216   Cardiac Enzymes: No results for input(s): CKTOTAL, CKMB, CKMBINDEX, TROPONINI in the last 8760 hours. BNP: Invalid input(s): POCBNP Lab Results  Component Value Date   HGBA1C 5.5 06/07/2008   Lab Results  Component Value Date   TSH 3.51 04/12/2016   Lab Results  Component Value Date   VITAMINB12 546 09/10/2011   Lab Results  Component Value Date   FOLATE >20.0 09/10/2011   Lab Results  Component Value Date   IRON 83 09/10/2011   TIBC 248 (L) 09/10/2011   FERRITIN 125 09/10/2011    Imaging and Procedures obtained prior to SNF admission: Dg Chest 2 View  Result Date: 06/14/2015 CLINICAL DATA:  Shortness of breath. History of lung cancer in 2011. EXAM: CHEST  2 VIEW COMPARISON:  Chest CT dated 02/03/2015 and chest x-ray dated 06/02/2013 FINDINGS: Heart size and pulmonary vascularity are normal. Calcification in the arch of the aorta. Scarring  at the left lung base. Chronic inflammatory changes in the periphery of the left midzone and in the perihilar regions bilaterally, unchanged since the prior CT scan. No effusions.  No acute osseous abnormality. IMPRESSION: Chronic lung disease.  No acute abnormalities. Electronically Signed   By: Lorriane Shire M.D.   On: 06/14/2015 16:52    Assessment/Plan 1. Diarrhea, unspecified type resolved  2. Hyponatremia improved  3. Generalized weakness Improved since receiving IV fluids  4. Vitamin B12 deficiency anemia due to intrinsic factor deficiency stable  5. Essential hypertension Controlled on current medication  6. Type 2 diabetes mellitus with hyperglycemia, unspecified whether long term insulin use (HCC) Controlled on diet, no medications

## 2017-04-15 ENCOUNTER — Encounter: Payer: Self-pay | Admitting: Nurse Practitioner

## 2017-04-15 ENCOUNTER — Non-Acute Institutional Stay (SKILLED_NURSING_FACILITY): Payer: Medicare HMO | Admitting: Nurse Practitioner

## 2017-04-15 DIAGNOSIS — I10 Essential (primary) hypertension: Secondary | ICD-10-CM | POA: Diagnosis not present

## 2017-04-15 DIAGNOSIS — J309 Allergic rhinitis, unspecified: Secondary | ICD-10-CM | POA: Diagnosis not present

## 2017-04-15 DIAGNOSIS — E784 Other hyperlipidemia: Secondary | ICD-10-CM | POA: Diagnosis not present

## 2017-04-15 DIAGNOSIS — E041 Nontoxic single thyroid nodule: Secondary | ICD-10-CM | POA: Diagnosis not present

## 2017-04-15 DIAGNOSIS — R69 Illness, unspecified: Secondary | ICD-10-CM | POA: Diagnosis not present

## 2017-04-15 DIAGNOSIS — B370 Candidal stomatitis: Secondary | ICD-10-CM

## 2017-04-15 DIAGNOSIS — F411 Generalized anxiety disorder: Secondary | ICD-10-CM

## 2017-04-15 DIAGNOSIS — R079 Chest pain, unspecified: Secondary | ICD-10-CM | POA: Diagnosis not present

## 2017-04-15 DIAGNOSIS — E1165 Type 2 diabetes mellitus with hyperglycemia: Secondary | ICD-10-CM

## 2017-04-15 DIAGNOSIS — D509 Iron deficiency anemia, unspecified: Secondary | ICD-10-CM | POA: Diagnosis not present

## 2017-04-15 DIAGNOSIS — I471 Supraventricular tachycardia: Secondary | ICD-10-CM

## 2017-04-15 DIAGNOSIS — E871 Hypo-osmolality and hyponatremia: Secondary | ICD-10-CM | POA: Diagnosis not present

## 2017-04-15 NOTE — Progress Notes (Signed)
Location:  [facility] Room Number: 50 Place of Service:  SNF (31) Provider:  Mast, Manxie  NP  Jeanmarie Hubert, MD  Patient Care Team: Estill Dooms, MD as PCP - General (Internal Medicine) Lanelle Bal, MD as Referring Physician (Internal Medicine) Satira Sark, MD as Consulting Physician (Cardiology) Kyung Rudd, MD as Consulting Physician (Radiation Oncology)  Extended Emergency Contact Information Primary Emergency Contact: Neita Garnet of [place] Phone: [phone] Relation: Son Secondary Emergency Contact: Hosein,Lance Address: [address]          [place], [place] [address] [name] of [place] Phone: [phone] Mobile Phone: [phone] Relation: Son  Code Status:  Full Code Goals of care: Advanced Directive information Advanced Directives 04/15/2017  Does Patient Have a Medical Advance Directive? Yes  Type of Paramedic of [place];Living will  Does patient want to make changes to medical advance directive? No - Patient declined  Copy of [place] in Chart? Yes  Pre-existing out of facility DNR order (yellow form or pink MOST form) -     Chief Complaint  Patient presents with  . Acute Visit    Medication questions    HPI:  Pt is a 83 y.o. female seen today for an acute visit for persisted poor oral intake, insomnia, Mirtazapine 7.81m at night, not adequate,  Hx of hyponatremia, baseline 130s, the patient c/o not feeling well, she was found to have Na 126, improved after NS 20071m Na 132, repeated Na 122 04/15/17    Blood pressure is controlled, off Atenolol 12.52m83maily, the patient desires to resume it.  Takes Vit B12 1003903ESPily, folic acid 400233AQTily, Fe, Hgb 11.7 04/02/17.    Past Medical History:  Diagnosis Date  . Allergic rhinitis   . Anemia    NOS  . Anxiety   . Biceps tendon rupture    Bilateral  . Breast cancer (HCCAngie001   Left s/p  lumpectomy and XRT   . Bronchiectasis ([place]CC[place]014   Noted on chest x-ray  . Cough 2014  . Depression   . Diverticulosis   . Elevated liver enzymes    Biopsy consistent with steatohepatitis  . Fundic gland polyps of stomach, benign   . GERD (gastroesophageal reflux disease) 06/2007   EGD Dr RouGala Romne[facility], multiple fundic gland polyps  . Glaucoma 2013   both eyes  . Hemorrhoids   . Hiatal hernia   . Hx of radiation therapy 07/17/10 to 07/21/10   [facility] LLL lung  . Hyperlipidemia   . Insomnia   . Low back pain    scoliosis  . Lung cancer (HCCSusan Moore/2011   Left 2011 - rad x 3  . Lymphedema    Left arm  . Osteoarthritis   . Osteopenia 10/25/2016  . Osteoporosis, senile   . Overactive bladder   . Pneumonia    RLL with sepsis  . PSVT (paroxysmal supraventricular tachycardia) (HCCBoyle . Rheumatic fever    Age [age] Right thyroid nodule   . Steatohepatitis    liver bx  . Type 2 diabetes mellitus (HCC[place]  Past Surgical History:  Procedure Laterality Date  . ABDOMINAL HYSTERECTOMY  1964  . APPENDECTOMY  1964  . BIOPSY THYROID  11/2008  . BREAST BIOPSY     X 3 w/ cystectomy  . BREAST LUMPECTOMY  2001  . CATARACT EXTRACTION Bilateral 2003   Lens implant Dr. EpeCharise Killian  CHOLECYSTECTOMY  1965  . COLONOSCOPY  09/11/2011   Procedure: COLONOSCOPY;  Surgeon: Dorothyann Peng, MD;  Location: [facility];  Service: Endoscopy;  Laterality: N/A;  . COLONOSCOPY  2005 /2012   Dr. Lucianne Muss- diverticulosis, ext hemorrhoids.  . CYSTOSCOPY  2007  . ESOPHAGOGASTRODUODENOSCOPY  09/11/2011   Procedure: ESOPHAGOGASTRODUODENOSCOPY (EGD);  Surgeon: Dorothyann Peng, MD;  Location: [facility];  Service: Endoscopy;  Laterality: N/A;  . LIVER BIOPSY  2008  . LUNG BIOPSY Left 06/06/2010  . REVISION TOTAL HIP ARTHROPLASTY  2007   Left  . SKIN CANCER EXCISION     nose and left lower cheek  . TONSILLECTOMY  1930  . TOTAL HIP ARTHROPLASTY Bilateral 2005 & 2007    Dr. Earl Lagos. Aline Brochure     Allergies  Allergen  Reactions  . Ibuprofen Hives  . Naproxen   . Statins Other (See Comments)    Feels like knives in stomach  . Surgilube [Gyne-Moistrin]     Rectal itching, burning  . Tape Rash    Outpatient Encounter Prescriptions as of 04/15/2017  Medication Sig  . acetaminophen (TYLENOL) 325 MG tablet Take 650 mg by mouth. Take 2 tablets every 6 hours as needed  . aspirin 81 MG EC tablet Take 81 mg by mouth daily.    . brimonidine (ALPHAGAN) 0.2 % ophthalmic solution Place 1 drop into both eyes 3 (three) times daily.  Marland Kitchen Dextromethorphan-Guaifenesin (ROBAFEN DM) 10-100 MG/5ML liquid Take 5 mLs by mouth. 10 ml by mouth every 6 hours as need for cough in 48 hours  . dorzolamide-timolol (COSOPT) 22.3-6.8 MG/ML ophthalmic solution 1 drop. One drop in each eye twice daily  . lactose free nutrition (BOOST) LIQD Take 237 mLs by mouth. Serve in cup over ice twice a day between meals  . latanoprost (XALATAN) 0.005 % ophthalmic solution Place 1 drop into both eyes daily.   Marland Kitchen loratadine (ALLERGY RELIEF) 10 MG tablet Take 10 mg by mouth daily.   . magnesium hydroxide (MILK OF MAGNESIA) 400 MG/5ML suspension Take by mouth. 30 ml once a daily as needed for constipation  . mirtazapine (REMERON) 7.5 MG tablet Take 7.5 mg by mouth. Take one tablet at bedtime  . Multiple Vitamin (MULTIVITAMIN) capsule Take 1 capsule by mouth daily.   . Multiple Vitamins-Minerals (PRESERVISION AREDS 2 PO) Take by mouth. Take one capsule twice a day for vision  . omeprazole (PRILOSEC) 20 MG capsule Take 20 mg by mouth. Take one tablet daily  . vitamin B-12 (CYANOCOBALAMIN) 1000 MCG tablet Take 1,000 mcg by mouth daily.  . [DISCONTINUED] atenolol (TENORMIN) 25 MG tablet TAKE (1/2) TABLET DAILY FOR BLOOD PRESSURE.  . [DISCONTINUED] Biotin 1000 MCG tablet Take 1,000 mcg by mouth every evening.   . [DISCONTINUED] Calcium Carbonate-Vitamin D (CALTRATE 600+D) 600-400 MG-UNIT per tablet Take 1 tablet by mouth daily.    . [DISCONTINUED] folic acid  (FOLVITE) 846 MCG tablet Take 400 mcg by mouth daily.    . [DISCONTINUED] IRON PO Take 65 mg by mouth daily.   . [DISCONTINUED] ketotifen (ZADITOR) 0.025 % ophthalmic solution Place 1 drop into both eyes daily as needed.   No facility-administered encounter medications on file as of 04/15/2017.     Review of Systems  Constitutional: Positive for activity change, appetite change and fatigue. Negative for chills, diaphoresis, fever and unexpected weight change.  HENT: Positive for postnasal drip. Negative for congestion, ear discharge, ear pain, hearing loss, rhinorrhea, sore throat, tinnitus, trouble swallowing and voice change.  Eyes: Negative.  Negative for pain, discharge, redness, itching and visual disturbance.  Respiratory: Positive for cough. Negative for choking, shortness of breath and wheezing.        Chronic hacking cough  Cardiovascular: Negative for chest pain, palpitations and leg swelling.  Gastrointestinal: Positive for diarrhea. Negative for abdominal distention, abdominal pain, constipation and nausea.       Resolved 10 day diarrhea in the past 24 hours.   Endocrine: Negative for cold intolerance, heat intolerance, polydipsia, polyphagia and polyuria.  Genitourinary: Negative for difficulty urinating, dysuria, flank pain, frequency, hematuria, pelvic pain, urgency and vaginal discharge.  Musculoskeletal: Positive for arthralgias, gait problem and myalgias. Negative for back pain, neck pain and neck stiffness.       Left knee and foot pain, no warmth or significant swelling, pain worsens with walking.   Skin: Negative for color change, pallor and rash.  Allergic/Immunologic: Negative.   Neurological: Negative for dizziness, tremors, seizures, syncope, weakness, numbness and headaches.  Hematological: Negative for adenopathy. Does not bruise/bleed easily.  Psychiatric/Behavioral: Positive for sleep disturbance. Negative for agitation, behavioral problems, confusion, dysphoric  mood, hallucinations and suicidal ideas. The patient is nervous/anxious. The patient is not hyperactive.     Immunization History  Administered Date(s) Administered  . Influenza Whole 09/22/2008, 09/16/2012, 09/30/2013  . Influenza-Unspecified 10/02/2014, 09/15/2015, 09/27/2016  . Pneumococcal Polysaccharide-23 08/17/2006  . Td 12/17/2005   Pertinent  Health Maintenance Due  Topic Date Due  . FOOT EXAM  07/19/1931  . OPHTHALMOLOGY EXAM  07/19/1931  . PNA vac Low Risk Adult (2 of 2 - PCV13) 08/18/2007  . URINE MICROALBUMIN  04/30/2008  . HEMOGLOBIN A1C  12/07/2008  . INFLUENZA VACCINE  07/17/2017  . DEXA SCAN  Completed   Fall Risk  05/17/2016 04/20/2016 04/19/2016  Falls in the past year? No No No   Functional Status Survey:    Vitals:   04/15/17 1009  BP: (!) 142/76  Pulse: 76  Resp: 18  Temp: 99.4 F (37.4 C)  Weight: 151 lb 6.4 oz (68.7 kg)  Height: 5' 5"  (1.651 m)   Body mass index is 25.19 kg/m. Physical Exam  Constitutional: She is oriented to person, place, and time. She appears well-developed and well-nourished. No distress.  HENT:  Head: Normocephalic and atraumatic.  Mouth/Throat: Oropharynx is clear and moist. No oropharyngeal exudate.  White coated tongue  Eyes: Conjunctivae are normal. Pupils are equal, round, and reactive to light.  Neck: Normal range of motion. Neck supple.  Cardiovascular: Normal rate, regular rhythm and normal heart sounds.   Occasional PACs  Pulmonary/Chest: Effort normal and breath sounds normal. No respiratory distress. She has no wheezes. She has no rales.  Abdominal: Soft. Bowel sounds are normal.  Genitourinary: Rectal exam shows guaiac negative stool.  Musculoskeletal: Normal range of motion. She exhibits no edema or tenderness.  Neurological: She is alert and oriented to person, place, and time.  Skin: Skin is warm and dry. She is not diaphoretic.  Left parietal area skin cancer removal scar  Psychiatric: She has a normal mood  and affect.    Labs reviewed:  Recent Labs  11/23/16 0730 04/02/17 04/04/17  NA 135 126* 132*  K 4.3 4.6 4.2  CL 98  --   --   CO2 29  --   --   GLUCOSE 74  --   --   BUN 15 12 6   CREATININE 1.02* 0.9 0.9  CALCIUM 9.7  --   --     Recent  Labs  04/02/17  AST 24  ALT 17  ALKPHOS 94    Recent Labs  04/02/17  WBC 4.1  HGB 11.7*  HCT 35*  PLT 216   Lab Results  Component Value Date   TSH 3.51 04/12/2016   Lab Results  Component Value Date   HGBA1C 5.5 06/07/2008   Lab Results  Component Value Date   CHOL 188 04/12/2016   HDL 62 04/12/2016   LDLCALC 102 04/12/2016   TRIG 122 04/12/2016   CHOLHDL 3.0 Ratio 06/07/2008    Significant Diagnostic Results in last 30 days:  No results found.  Assessment/Plan HTN (hypertension) Atenolol 12.64m qd, monitor Bp  PSVT (paroxysmal supraventricular tachycardia) The patient requested to resume Atenolol 12.517mqd, VS q shift. Obtain EKG  Allergic rhinitis Dc Claritin, the patient stated it has little effect, Zyrtec 1067md, post nasal drainage and no productive cough, obtain CXR  Type 2 diabetes mellitus with hyperglycemia (HCC) Fasting CBG qam, update Hgb a1c  Anxiety state Feeling low, on Mirtazapine 7.5mg7m qhs to improve sleep, appetite, mood.  Too soon to eval The patient c/o not sleeping well, poor appetite, muscle pains, adding Cymbalta 20mg91mponatremia Na 126 04/02/17, generalized weakness, afebrile, denied chest pain, dysuria, nausea, vomiting, or constipation, s/p IVF D5 NS 1000ml,58m132 04/04/17, K 4.2, Bun 6, creat 0.9 04/04/17 Na 132 04/15/17 Na 122, K 5.0, Bun 15, creat 0.90, glucose 78, NS 100cc/hr x 2000ml, 14mupon completion of IVF. Cortisol level in am, obtain plasma serum, TSH, urine Na  CBC CMP weekly.     Candidiasis of mouth Magic mouth wash 5ml s/s77m and hx 2 weeks.   THYROID NODULE, RIGHT Update TSH     Family/ staff Communication: SNF  Labs/tests ordered:  CBC CMP weekly.  BMP upon completion of IVF NS 2000ml. TS1mgb a1c, urine Na, am cortisol level

## 2017-04-15 NOTE — Assessment & Plan Note (Addendum)
The patient requested to resume Atenolol 12.75m qd, VS q shift. Obtain EKG

## 2017-04-15 NOTE — Assessment & Plan Note (Addendum)
Na 126 04/02/17, generalized weakness, afebrile, denied chest pain, dysuria, nausea, vomiting, or constipation, s/p IVF D5 NS 1053m, Na 132 04/04/17, K 4.2, Bun 6, creat 0.9 04/04/17 Na 132 04/15/17 Na 122, K 5.0, Bun 15, creat 0.90, glucose 78, NS 100cc/hr x 20036m BMP upon completion of IVF. Cortisol level in am, obtain plasma serum, TSH, urine Na  CBC CMP weekly.

## 2017-04-15 NOTE — Assessment & Plan Note (Signed)
Feeling low, on Mirtazapine 7.19m po qhs to improve sleep, appetite, mood.  Too soon to eval The patient c/o not sleeping well, poor appetite, muscle pains, adding Cymbalta 212m

## 2017-04-15 NOTE — Assessment & Plan Note (Signed)
Update TSH

## 2017-04-15 NOTE — Assessment & Plan Note (Signed)
Magic mouth wash 47m s/s ac and hx 2 weeks.

## 2017-04-15 NOTE — Assessment & Plan Note (Addendum)
Dc Claritin, the patient stated it has little effect, Zyrtec 31m qd, post nasal drainage and no productive cough, obtain CXR

## 2017-04-15 NOTE — Assessment & Plan Note (Addendum)
Atenolol 12.9m qd, monitor Bp

## 2017-04-15 NOTE — Assessment & Plan Note (Addendum)
Fasting CBG qam, update Hgb a1c

## 2017-04-16 DIAGNOSIS — E784 Other hyperlipidemia: Secondary | ICD-10-CM | POA: Diagnosis not present

## 2017-04-16 DIAGNOSIS — M6281 Muscle weakness (generalized): Secondary | ICD-10-CM | POA: Diagnosis not present

## 2017-04-16 DIAGNOSIS — M545 Low back pain: Secondary | ICD-10-CM | POA: Diagnosis not present

## 2017-04-16 DIAGNOSIS — D0590 Unspecified type of carcinoma in situ of unspecified breast: Secondary | ICD-10-CM | POA: Diagnosis not present

## 2017-04-16 DIAGNOSIS — E871 Hypo-osmolality and hyponatremia: Secondary | ICD-10-CM | POA: Diagnosis not present

## 2017-04-16 DIAGNOSIS — E119 Type 2 diabetes mellitus without complications: Secondary | ICD-10-CM | POA: Diagnosis not present

## 2017-04-16 DIAGNOSIS — I10 Essential (primary) hypertension: Secondary | ICD-10-CM | POA: Diagnosis not present

## 2017-04-16 DIAGNOSIS — D0220 Carcinoma in situ of unspecified bronchus and lung: Secondary | ICD-10-CM | POA: Diagnosis not present

## 2017-04-16 LAB — HEMOGLOBIN A1C: Hemoglobin A1C: 5.3

## 2017-04-16 LAB — CBC AND DIFFERENTIAL
HEMATOCRIT: 31 % — AB (ref 36–46)
HEMOGLOBIN: 10.1 g/dL — AB (ref 12.0–16.0)
Platelets: 310 10*3/uL (ref 150–399)
WBC: 8.8 10^3/mL

## 2017-04-16 LAB — BASIC METABOLIC PANEL
BUN: 12 mg/dL (ref 4–21)
Creatinine: 0.8 mg/dL (ref <=1.1)
Glucose: 71 mg/dL
Potassium: 4.8 mmol/L (ref 3.4–5.3)
SODIUM: 129 mmol/L — AB (ref 137–147)

## 2017-04-16 LAB — TSH: TSH: 33 u[IU]/mL — AB (ref <=5.90)

## 2017-04-16 LAB — HEPATIC FUNCTION PANEL
ALK PHOS: 87 U/L (ref 25–125)
ALT: 16 U/L (ref 7–35)
AST: 17 U/L (ref 13–35)

## 2017-04-17 ENCOUNTER — Other Ambulatory Visit: Payer: Self-pay | Admitting: *Deleted

## 2017-04-17 DIAGNOSIS — R69 Illness, unspecified: Secondary | ICD-10-CM | POA: Diagnosis not present

## 2017-04-18 DIAGNOSIS — I10 Essential (primary) hypertension: Secondary | ICD-10-CM | POA: Diagnosis not present

## 2017-04-19 DIAGNOSIS — I10 Essential (primary) hypertension: Secondary | ICD-10-CM | POA: Diagnosis not present

## 2017-04-22 ENCOUNTER — Non-Acute Institutional Stay (SKILLED_NURSING_FACILITY): Payer: Medicare HMO | Admitting: Nurse Practitioner

## 2017-04-22 ENCOUNTER — Encounter: Payer: Self-pay | Admitting: *Deleted

## 2017-04-22 ENCOUNTER — Encounter: Payer: Self-pay | Admitting: Nurse Practitioner

## 2017-04-22 DIAGNOSIS — J479 Bronchiectasis, uncomplicated: Secondary | ICD-10-CM | POA: Diagnosis not present

## 2017-04-22 DIAGNOSIS — M545 Low back pain: Secondary | ICD-10-CM | POA: Diagnosis not present

## 2017-04-22 DIAGNOSIS — E1165 Type 2 diabetes mellitus with hyperglycemia: Secondary | ICD-10-CM

## 2017-04-22 DIAGNOSIS — F411 Generalized anxiety disorder: Secondary | ICD-10-CM

## 2017-04-22 DIAGNOSIS — I471 Supraventricular tachycardia: Secondary | ICD-10-CM

## 2017-04-22 DIAGNOSIS — I10 Essential (primary) hypertension: Secondary | ICD-10-CM | POA: Diagnosis not present

## 2017-04-22 DIAGNOSIS — D0590 Unspecified type of carcinoma in situ of unspecified breast: Secondary | ICD-10-CM | POA: Diagnosis not present

## 2017-04-22 DIAGNOSIS — E871 Hypo-osmolality and hyponatremia: Secondary | ICD-10-CM

## 2017-04-22 DIAGNOSIS — R69 Illness, unspecified: Secondary | ICD-10-CM | POA: Diagnosis not present

## 2017-04-22 DIAGNOSIS — E784 Other hyperlipidemia: Secondary | ICD-10-CM | POA: Diagnosis not present

## 2017-04-22 DIAGNOSIS — K219 Gastro-esophageal reflux disease without esophagitis: Secondary | ICD-10-CM

## 2017-04-22 DIAGNOSIS — D500 Iron deficiency anemia secondary to blood loss (chronic): Secondary | ICD-10-CM

## 2017-04-22 DIAGNOSIS — M6281 Muscle weakness (generalized): Secondary | ICD-10-CM | POA: Diagnosis not present

## 2017-04-22 DIAGNOSIS — K5900 Constipation, unspecified: Secondary | ICD-10-CM | POA: Diagnosis not present

## 2017-04-22 DIAGNOSIS — J309 Allergic rhinitis, unspecified: Secondary | ICD-10-CM

## 2017-04-22 DIAGNOSIS — M1990 Unspecified osteoarthritis, unspecified site: Secondary | ICD-10-CM | POA: Diagnosis not present

## 2017-04-22 DIAGNOSIS — M25561 Pain in right knee: Secondary | ICD-10-CM | POA: Diagnosis not present

## 2017-04-22 DIAGNOSIS — R2681 Unsteadiness on feet: Secondary | ICD-10-CM | POA: Diagnosis not present

## 2017-04-22 NOTE — Assessment & Plan Note (Signed)
Diet controlled, Hgb a1c 5.3 04/22/17

## 2017-04-22 NOTE — Assessment & Plan Note (Addendum)
Heart rate is in control, continue  Atenolol 12.52m qd, 04/15/17 EKG SR PAC

## 2017-04-22 NOTE — Assessment & Plan Note (Signed)
04/02/17 Na 126 04/04/17 Na 132 04/15/17 Na 122, K 5.0, Bun 15, creat 0.90, glucose 78 04/16/17 Na 129, K 4.8, Bun 12, creat 0.76, TSH 1.54, wbc 8.8, Hgb 10.1, plt 310. Urine Na 38(28-272), serum osmolality [phone]). BMP 04/17/17 Na 123, K 4.9, Bun 15, creat 0.95 BMP 04/18/17 Na 126, K 4.6, Bun 15, creat 0.79 04/19/17 Na 129, K 5.2, Bun 17, creat 0.88 04/22/17 Na 129, K 4.8, Bun 12, creat 0.76, Hgb a1c 5.3, TSH 1.54, ESR 86, wbc 8.8, Hgb 10.1, plt 310, BMP weekly.

## 2017-04-22 NOTE — Assessment & Plan Note (Signed)
04/22/17 Hgb 10.1, continue Fe, B12, Folate

## 2017-04-22 NOTE — Assessment & Plan Note (Signed)
Stable

## 2017-04-22 NOTE — Progress Notes (Signed)
Location:  [facility] Room Number: 60A Place of Service:  SNF (31) Provider:  Eveleigh Crumpler, Manxie  NP  Estill Dooms, MD  Patient Care Team: Estill Dooms, MD as PCP - General (Internal Medicine) Lannette Donath Glo Herring., MD as Referring Physician (Internal Medicine) Satira Sark, MD as Consulting Physician (Cardiology) Kyung Rudd, MD as Consulting Physician (Radiation Oncology)  Extended Emergency Contact Information Primary Emergency Contact: Neita Garnet of [place] Phone: [phone] Relation: Son Secondary Emergency Contact: Clarida,Lance Address: [address]          [place], [place] [address] [name] of [place] Phone: [phone] Mobile Phone: [phone] Relation: Son  Code Status:  Full Code Goals of care: Advanced Directive information Advanced Directives 04/22/2017  Does Patient Have a Medical Advance Directive? Yes  Type of Paramedic of [place];Living will  Does patient want to make changes to medical advance directive? No - Patient declined  Copy of [place] in Chart? Yes  Pre-existing out of facility DNR order (yellow form or pink MOST form) -     Chief Complaint  Patient presents with  . Acute Visit    Low sodium    HPI:  Pt is a 83 y.o. female seen today for an acute visit for hyponatremia, s/p IVF NS x 2054m. Serum Na 129 04/22/17    Blood pressure, heart rate are controlled, on Atenolol 12.513mdaily. Takes Vit B12 105916BWGaily, folic acid 40665LDJaily, Fe, Hgb 10.1 04/22/17. Sleeps and mood are stabilizing on Mirtazapine and Cymbalta.   Past Medical History:  Diagnosis Date  . Allergic rhinitis   . Anemia    NOS  . Anxiety   . Biceps tendon rupture    Bilateral  . Breast [place] ([facility]   Left s/p lumpectomy and XRT   . Bronchiectasis ([facility]   Noted on chest x-ray  . Cough 2014  . Depression   . Diverticulosis   . Elevated liver enzymes      Biopsy consistent with steatohepatitis  . Fundic gland polyps of stomach, benign   . GERD (gastroesophageal reflux disease) 06/2007   EGD Dr [facility]ala Romneys[facility], multiple fundic gland polyps  . Glaucoma 2013   both eyes  . Hemorrhoids   . Hiatal hernia   . Hx of radiation therapy 07/17/10 to 07/21/10   SRS LLL lung  . Hyperlipidemia   . Insomnia   . Low back pain    scoliosis  . Lung cancer ([facility]/2011   Left 2011 - rad x 3  . Lymphedema    Left arm  . Osteoarthritis   . Osteopenia 10/25/2016  . Osteoporosis, senile   . Overactive bladder   . Pneumonia    RLL with sepsis  . PSVT (paroxysmal supraventricular tachycardia) ([facility]  . Rheumatic fever    Age 83. Right thyroid nodule   . Steatohepatitis    liver bx  . Type 2 diabetes mellitus ([facility]   Past Surgical History:  Procedure Laterality Date  . ABDOMINAL HYSTERECTOMY  1964  . APPENDECTOMY  1964  . BIOPSY THYROID  11/2008  . BREAST BIOPSY     X 3 w/ cystectomy  . BREAST LUMPECTOMY  2001  . CATARACT EXTRACTION Bilateral 2003   Lens implant Dr. EpCharise Killian. CHOLECYSTECTOMY  1965  . COLONOSCOPY  09/11/2011   Procedure: COLONOSCOPY;  Surgeon: SaDorothyann PengMD;  [facility];  Service: Endoscopy;  Laterality: N/A;  . COLONOSCOPY  2005 /2012   Dr. Lucianne Muss- diverticulosis, ext hemorrhoids.  . CYSTOSCOPY  2007  . ESOPHAGOGASTRODUODENOSCOPY  09/11/2011   Procedure: ESOPHAGOGASTRODUODENOSCOPY (EGD);  Surgeon: Dorothyann Peng, MD;  Location[facility];  Service: Endoscopy;  Laterality: N/A;  . LIVER BIOPSY  2008  . LUNG BIOPSY Left 06/06/2010  . REVISION TOTAL HIP ARTHROPLASTY  2007   Left  . SKIN CANCER EXCISION     nose and left lower cheek  . TONSILLECTOMY  1930  . TOTAL HIP ARTHROPLASTY Bilateral 2005 & 2007    Dr. Earl Lagos. Aline Brochure     Allergies  Allergen Reactions  . Ibuprofen Hives  . Naproxen   . Statins Other (See Comments)    Feels like knives in stomach  . Surgilube [Gyne-Moistrin]     Rectal  itching, burning  . Tape Rash    Outpatient Encounter Prescriptions as of 04/22/2017  Medication Sig  . acetaminophen (TYLENOL) 325 MG tablet Take 650 mg by mouth. Take 2 tablets every 6 hours as needed  . aspirin 81 MG EC tablet Take 81 mg by mouth daily.    . brimonidine (ALPHAGAN) 0.2 % ophthalmic solution Place 1 drop into both eyes 3 (three) times daily.  . cetirizine (ZYRTEC) 10 MG tablet Take 10 mg by mouth daily.  Marland Kitchen Dextromethorphan-Guaifenesin (ROBAFEN DM) 10-100 MG/5ML liquid Take 5 mLs by mouth. 10 ml by mouth every 6 hours as need for cough in 48 hours  . dorzolamide-timolol (COSOPT) 22.3-6.8 MG/ML ophthalmic solution 1 drop. One drop in each eye twice daily  . lactose free nutrition (BOOST) LIQD Take 237 mLs by mouth. Serve in cup over ice twice a day between meals  . magnesium hydroxide (MILK OF MAGNESIA) 400 MG/5ML suspension Take by mouth. 30 ml once a daily as needed for constipation  . mirtazapine (REMERON) 7.5 MG tablet Take 7.5 mg by mouth. Take one tablet at bedtime  . Multiple Vitamin (MULTIVITAMIN) capsule Take 1 capsule by mouth daily.   . Multiple Vitamins-Minerals (PRESERVISION AREDS 2 PO) Take by mouth. Take one capsule twice a day for vision  . omeprazole (PRILOSEC) 20 MG capsule Take 20 mg by mouth. Take one tablet daily  . vitamin B-12 (CYANOCOBALAMIN) 1000 MCG tablet Take 1,000 mcg by mouth daily.  . [DISCONTINUED] latanoprost (XALATAN) 0.005 % ophthalmic solution Place 1 drop into both eyes daily.   . [DISCONTINUED] loratadine (ALLERGY RELIEF) 10 MG tablet Take 10 mg by mouth daily.    No facility-administered encounter medications on file as of 04/22/2017.     Review of Systems  Constitutional: Positive for activity change, appetite change and fatigue. Negative for chills, diaphoresis, fever and unexpected weight change.  HENT: Positive for postnasal drip. Negative for congestion, ear discharge, ear pain, hearing loss, rhinorrhea, sore throat, tinnitus, trouble  swallowing and voice change.   Eyes: Negative.  Negative for pain, discharge, redness, itching and visual disturbance.  Respiratory: Positive for cough. Negative for choking, shortness of breath and wheezing.        Chronic hacking cough  Cardiovascular: Negative for chest pain, palpitations and leg swelling.  Gastrointestinal: Positive for diarrhea. Negative for abdominal distention, abdominal pain, constipation and nausea.       Resolved 10 day diarrhea in the past 24 hours.   Endocrine: Negative for cold intolerance, heat intolerance, polydipsia, polyphagia and polyuria.  Genitourinary: Negative for difficulty urinating, dysuria, flank pain, frequency, hematuria, pelvic pain, urgency and vaginal discharge.  Musculoskeletal: Positive for arthralgias, gait problem and myalgias. Negative for back pain, neck pain and neck stiffness.       Left knee and foot pain, no warmth or significant swelling, pain worsens with walking.   Skin: Negative for color change, pallor and rash.  Allergic/Immunologic: Negative.   Neurological: Negative for dizziness, tremors, seizures, syncope, weakness, numbness and headaches.  Hematological: Negative for adenopathy. Does not bruise/bleed easily.  Psychiatric/Behavioral: Positive for sleep disturbance. Negative for agitation, behavioral problems, confusion, dysphoric mood, hallucinations and suicidal ideas. The patient is nervous/anxious. The patient is not hyperactive.     Immunization History  Administered Date(s) Administered  . Influenza Whole 09/22/2008, 09/16/2012, 09/30/2013  . Influenza-Unspecified 10/02/2014, 09/15/2015, 09/27/2016  . Pneumococcal Polysaccharide-23 08/17/2006  . Td 12/17/2005   Pertinent  Health Maintenance Due  Topic Date Due  . FOOT EXAM  07/19/1931  . OPHTHALMOLOGY EXAM  07/19/1931  . PNA vac Low Risk Adult (2 of 2 - PCV13) 08/18/2007  . URINE MICROALBUMIN  04/30/2008  . INFLUENZA VACCINE  07/17/2017  . HEMOGLOBIN A1C   10/17/2017  . DEXA SCAN  Completed   Fall Risk  05/17/2016 04/20/2016 04/19/2016  Falls in the past year? No No No   Functional Status Survey:    Vitals:   04/22/17 1143  BP: 130/68  Pulse: 72  Resp: 20  Temp: 99 F (37.2 C)  Weight: 151 lb 6.4 oz (68.7 kg)  Height: 5' 5" (1.651 m)   Body mass index is 25.19 kg/m. Physical Exam  Constitutional: She is oriented to person, place, and time. She appears well-developed and well-nourished. No distress.  HENT:  Head: Normocephalic and atraumatic.  Mouth/Throat: Oropharynx is clear and moist. No oropharyngeal exudate.  White coated tongue  Eyes: Conjunctivae are normal. Pupils are equal, round, and reactive to light.  Neck: Normal range of motion. Neck supple.  Cardiovascular: Normal rate, regular rhythm and normal heart sounds.   Occasional PACs  Pulmonary/Chest: Effort normal and breath sounds normal. No respiratory distress. She has no wheezes. She has no rales.  Abdominal: Soft. Bowel sounds are normal.  Genitourinary: Rectal exam shows guaiac negative stool.  Musculoskeletal: Normal range of motion. She exhibits no edema or tenderness.  Neurological: She is alert and oriented to person, place, and time.  Skin: Skin is warm and dry. She is not diaphoretic.  Left parietal area skin cancer removal scar  Psychiatric: She has a normal mood and affect.    Labs reviewed:  Recent Labs  11/23/16 0730 04/02/17 04/04/17 04/16/17  NA 135 126* 132* 129*  K 4.3 4.6 4.2 4.8  CL 98  --   --   --   CO2 29  --   --   --   GLUCOSE 74  --   --   --   BUN _0 CREATININE 1.02* 0.9 0.9 0.8  CALCIUM 9.7  --   --   --     Recent Labs  04/02/17 04/16/17  AST 24 17  ALT 17 16  ALKPHOS 94 87    Recent Labs  04/02/17 04/16/17  WBC 4.1 8.8  HGB 11.7* 10.1*  HCT 35* 31*  PLT 216 310   Lab Results  Component Value Date   TSH 33.00 (A) 04/16/2017   Lab Results  Component Value Date   HGBA1C 5.3 04/16/2017   Lab Results    Component Value Date   CHOL 188 04/12/2016   HDL 62 04/12/2016   LDLCALC  102 04/12/2016   TRIG 122 04/12/2016   CHOLHDL 3.0 Ratio 06/07/2008    Significant Diagnostic Results in last 30 days:  No results found.  Assessment/Plan Hyponatremia 04/02/17 Na 126 04/04/17 Na 132 04/15/17 Na 122, K 5.0, Bun 15, creat 0.90, glucose 78 04/16/17 Na 129, K 4.8, Bun 12, creat 0.76, TSH 1.54, wbc 8.8, Hgb 10.1, plt 310. Urine Na 38(28-272), serum osmolality [phone]). BMP 04/17/17 Na 123, K 4.9, Bun 15, creat 0.95 BMP 04/18/17 Na 126, K 4.6, Bun 15, creat 0.79 04/19/17 Na 129, K 5.2, Bun 17, creat 0.88 04/22/17 Na 129, K 4.8, Bun 12, creat 0.76, Hgb a1c 5.3, TSH 1.54, ESR 86, wbc 8.8, Hgb 10.1, plt 310, BMP weekly.   HTN (hypertension) Atenolol 12.5m qd, monitor Bp  PSVT (paroxysmal supraventricular tachycardia) (HCC) Heart rate is in control, continue  Atenolol 12.579mqd, 04/15/17 EKG SR PAC  GERD  Stable, continue Omeprazole 2016maily.   Bronchiectasis Zyrtec 84m67m, post nasal drainage and no productive cough   Allergic rhinitis  Better, continue Zyrtec 84mg52m post nasal drainage and no productive cough  Constipation Stable.   Type 2 diabetes mellitus with hyperglycemia (HCC) Diet controlled, Hgb a1c 5.3 04/22/17  Anemia 04/22/17 Hgb 10.1, continue Fe, B12, Folate  Anxiety state Stabilizing, continue Mirtazapine 7.5mg q61m Cymbalta 20mg d1m.      Family/ staff Communication: SNF  Labs/tests ordered:  BMP weekly

## 2017-04-22 NOTE — Progress Notes (Signed)
This encounter was created in error - please disregard.

## 2017-04-22 NOTE — Assessment & Plan Note (Signed)
Stabilizing, continue Mirtazapine 7.38m qhs, Cymbalta 262mdaily.

## 2017-04-22 NOTE — Assessment & Plan Note (Signed)
Atenolol 12.81m qd, monitor Bp

## 2017-04-22 NOTE — Assessment & Plan Note (Signed)
Better, continue Zyrtec 55m qd, post nasal drainage and no productive cough

## 2017-04-22 NOTE — Progress Notes (Signed)
Location:  [facility] Room Number: 60A Place of Service:  SNF (31) Provider: Mast,  Manxie  NP  Estill Dooms, MD  Patient Care Team: Estill Dooms, MD as PCP - General (Internal Medicine) Lannette Donath Glo Herring., MD as Referring Physician (Internal Medicine) Satira Sark, MD as Consulting Physician (Cardiology) Kyung Rudd, MD as Consulting Physician (Radiation Oncology)  Extended Emergency Contact Information Primary Emergency Contact: Neita Garnet of [place] Phone: [phone] Relation: Son Secondary Emergency Contact: Aguinaga,Lance Address: [address]          [place], [place] [address] [name] of [place] Phone: [phone] Mobile Phone: [phone] Relation: Son  Code Status:  DNR Goals of care: Advanced Directive information Advanced Directives 04/22/2017  Does Patient Have a Medical Advance Directive? Yes  Type of Paramedic of [place];Living will  Does patient want to make changes to medical advance directive? No - Patient declined  Copy of [place] in Chart? Yes  Pre-existing out of facility DNR order (yellow form or pink MOST form) -     Chief Complaint  Patient presents with  . Acute Visit    Low sodium    HPI:  Pt is a 82 y.o. female seen today for an acute visit for    Past Medical History:  Diagnosis Date  . Allergic rhinitis   . Anemia    NOS  . Anxiety   . Biceps tendon rupture    Bilateral  . Breast cancer ([place]) 2001   Left s/p lumpectomy and XRT   . Bronchiectasis ([place] [place]) 2014   Noted on chest x-ray  . Cough 2014  . Depression   . Diverticulosis   . Elevated liver enzymes    Biopsy consistent with steatohepatitis  . Fundic gland polyps of stomach, benign   . GERD (gastroesophageal reflux disease) 06/2007   EGD Dr Gala Romney >[facility], multiple fundic gland polyps  . Glaucoma 2013   both eyes  . Hemorrhoids   . Hiatal hernia   . Hx of  radiation therapy 07/17/10 to 07/21/10   SRS LLL lung  . Hyperlipidemia   . Insomnia   . Low back pain    scoliosis  . Lung cancer ([facility]) 05/2010   Left 2011 - rad x 3  . Lymphedema    Left arm  . Osteoarthritis   . Osteopenia 10/25/2016  . Osteoporosis, senile   . Overactive bladder   . Pneumonia    RLL with sepsis  . PSVT (paroxysmal supraventricular tachycardia) ([place])   . Rheumatic fever    Age 51  . Right thyroid nodule   . Steatohepatitis    liver bx  . Type 2 diabetes mellitus ([place])    Past Surgical History:  Procedure Laterality Date  . ABDOMINAL HYSTERECTOMY  1964  . APPENDECTOMY  1964  . BIOPSY THYROID  11/2008  . BREAST BIOPSY     X 3 w/ cystectomy  . BREAST LUMPECTOMY  2001  . CATARACT EXTRACTION Bilateral 2003   Lens implant Dr. Charise Killian  . CHOLECYSTECTOMY  1965  . COLONOSCOPY  09/11/2011   Procedure: COLONOSCOPY;  Surgeon: Dorothyann Peng, MD;  [facility];  Service: Endoscopy;  Laterality: N/A;  . COLONOSCOPY  2005 /2012   Dr. Lucianne Muss- diverticulosis, ext hemorrhoids.  . CYSTOSCOPY  2007  . ESOPHAGOGASTRODUODENOSCOPY  09/11/2011   Procedure: ESOPHAGOGASTRODUODENOSCOPY (EGD);  Surgeon: Dorothyann Peng, MD;  [facility];  Service: Endoscopy;  Laterality: N/A;  . LIVER BIOPSY  2008  . LUNG BIOPSY Left 06/06/2010  . REVISION TOTAL HIP ARTHROPLASTY  2007   Left  . SKIN CANCER EXCISION     nose and left lower cheek  . TONSILLECTOMY  1930  . TOTAL HIP ARTHROPLASTY Bilateral 2005 & 2007    Dr. Earl Lagos. Aline Brochure     Allergies  Allergen Reactions  . Ibuprofen Hives  . Naproxen   . Statins Other (See Comments)    Feels like knives in stomach  . Surgilube [Gyne-Moistrin]     Rectal itching, burning  . Tape Rash    Outpatient Encounter Prescriptions as of 04/22/2017  Medication Sig  . acetaminophen (TYLENOL) 325 MG tablet Take 650 mg by mouth. Take 2 tablets every 6 hours as needed  . aspirin 81 MG EC tablet Take 81 mg by mouth daily.    .  brimonidine (ALPHAGAN) 0.2 % ophthalmic solution Place 1 drop into both eyes 3 (three) times daily.  . cetirizine (ZYRTEC) 10 MG tablet Take 10 mg by mouth daily.  Marland Kitchen Dextromethorphan-Guaifenesin (ROBAFEN DM) 10-100 MG/5ML liquid Take 5 mLs by mouth. 10 ml by mouth every 6 hours as need for cough in 48 hours  . dorzolamide-timolol (COSOPT) 22.3-6.8 MG/ML ophthalmic solution 1 drop. One drop in each eye twice daily  . lactose free nutrition (BOOST) LIQD Take 237 mLs by mouth. Serve in cup over ice twice a day between meals  . magnesium hydroxide (MILK OF MAGNESIA) 400 MG/5ML suspension Take by mouth. 30 ml once a daily as needed for constipation  . mirtazapine (REMERON) 7.5 MG tablet Take 7.5 mg by mouth. Take one tablet at bedtime  . Multiple Vitamin (MULTIVITAMIN) capsule Take 1 capsule by mouth daily.   . Multiple Vitamins-Minerals (PRESERVISION AREDS 2 PO) Take by mouth. Take one capsule twice a day for vision  . omeprazole (PRILOSEC) 20 MG capsule Take 20 mg by mouth. Take one tablet daily  . vitamin B-12 (CYANOCOBALAMIN) 1000 MCG tablet Take 1,000 mcg by mouth daily.  . [DISCONTINUED] latanoprost (XALATAN) 0.005 % ophthalmic solution Place 1 drop into both eyes daily.   . [DISCONTINUED] loratadine (ALLERGY RELIEF) 10 MG tablet Take 10 mg by mouth daily.    No facility-administered encounter medications on file as of 04/22/2017.     Review of Systems  Immunization History  Administered Date(s) Administered  . Influenza Whole 09/22/2008, 09/16/2012, 09/30/2013  . Influenza-Unspecified 10/02/2014, 09/15/2015, 09/27/2016  . Pneumococcal Polysaccharide-23 08/17/2006  . Td 12/17/2005   Pertinent  Health Maintenance Due  Topic Date Due  . FOOT EXAM  07/19/1931  . OPHTHALMOLOGY EXAM  07/19/1931  . PNA vac Low Risk Adult (2 of 2 - PCV13) 08/18/2007  . URINE MICROALBUMIN  04/30/2008  . INFLUENZA VACCINE  07/17/2017  . HEMOGLOBIN A1C  10/17/2017  . DEXA SCAN  Completed   Fall Risk   05/17/2016 04/20/2016 04/19/2016  Falls in the past year? No No No   Functional Status Survey:    Vitals:   04/22/17 1143  BP: 130/68  Pulse: 72  Resp: 20  Temp: 99 F (37.2 C)  Weight: 151 lb 6.4 oz (68.7 kg)  Height: 5' 5"  (1.651 m)   Body mass index is 25.19 kg/m. Physical Exam  Labs reviewed:  Recent Labs  11/23/16 0730 04/02/17 04/04/17 04/16/17  NA 135 126* 132* 129*  K 4.3 4.6 4.2 4.8  CL 98  --   --   --  CO2 29  --   --   --   GLUCOSE 74  --   --   --   BUN 15 12 6 12   CREATININE 1.02* 0.9 0.9 0.8  CALCIUM 9.7  --   --   --     Recent Labs  04/02/17 04/16/17  AST 24 17  ALT 17 16  ALKPHOS 94 87    Recent Labs  04/02/17 04/16/17  WBC 4.1 8.8  HGB 11.7* 10.1*  HCT 35* 31*  PLT 216 310   Lab Results  Component Value Date   TSH 33.00 (A) 04/16/2017   Lab Results  Component Value Date   HGBA1C 5.3 04/16/2017   Lab Results  Component Value Date   CHOL 188 04/12/2016   HDL 62 04/12/2016   LDLCALC 102 04/12/2016   TRIG 122 04/12/2016   CHOLHDL 3.0 Ratio 06/07/2008    Significant Diagnostic Results in last 30 days:  No results found.  Assessment/Plan 1. Hyponatremia   2. Essential hypertension   3. PSVT (paroxysmal supraventricular tachycardia) ([place])   4. Gastroesophageal reflux disease without esophagitis   5. Bronchiectasis without complication ([place])   6. Allergic rhinitis, unspecified seasonality, unspecified trigger   7. Constipation, unspecified constipation type   8. Type 2 diabetes mellitus with hyperglycemia, unspecified whether long term insulin use ([place])   9. Iron deficiency anemia due to chronic blood loss   10. Anxiety state     Family/ staff Communication:   Labs/tests ordered:

## 2017-04-22 NOTE — Assessment & Plan Note (Signed)
Stable, continue Omeprazole 55m daily.

## 2017-04-22 NOTE — Assessment & Plan Note (Signed)
Zyrtec 40m qd, post nasal drainage and no productive cough

## 2017-04-23 LAB — CBC AND DIFFERENTIAL
HCT: 32 % — AB (ref 36–46)
HEMOGLOBIN: 10.7 g/dL — AB (ref 12.0–16.0)
Platelets: 366 10*3/uL (ref 150–399)
WBC: 11.9 10*3/mL

## 2017-04-23 LAB — HEPATIC FUNCTION PANEL
ALT: 21 U/L (ref 7–35)
AST: 19 U/L (ref 13–35)
Alkaline Phosphatase: 100 U/L (ref 25–125)
Bilirubin, Total: 0.3 mg/dL

## 2017-04-23 LAB — BASIC METABOLIC PANEL
BUN: 23 mg/dL — AB (ref 4–21)
Creatinine: 1.1 mg/dL (ref <=1.1)
Glucose: 101 mg/dL
POTASSIUM: 4.6 mmol/L (ref 3.4–5.3)
SODIUM: 130 mmol/L — AB (ref 137–147)

## 2017-04-24 ENCOUNTER — Other Ambulatory Visit: Payer: Self-pay | Admitting: *Deleted

## 2017-04-26 DIAGNOSIS — D0590 Unspecified type of carcinoma in situ of unspecified breast: Secondary | ICD-10-CM | POA: Diagnosis not present

## 2017-04-26 DIAGNOSIS — E784 Other hyperlipidemia: Secondary | ICD-10-CM | POA: Diagnosis not present

## 2017-04-26 DIAGNOSIS — D0220 Carcinoma in situ of unspecified bronchus and lung: Secondary | ICD-10-CM | POA: Diagnosis not present

## 2017-04-26 DIAGNOSIS — M545 Low back pain: Secondary | ICD-10-CM | POA: Diagnosis not present

## 2017-04-26 DIAGNOSIS — M6281 Muscle weakness (generalized): Secondary | ICD-10-CM | POA: Diagnosis not present

## 2017-04-29 DIAGNOSIS — H401131 Primary open-angle glaucoma, bilateral, mild stage: Secondary | ICD-10-CM | POA: Diagnosis not present

## 2017-04-30 DIAGNOSIS — I10 Essential (primary) hypertension: Secondary | ICD-10-CM | POA: Diagnosis not present

## 2017-04-30 DIAGNOSIS — M545 Low back pain: Secondary | ICD-10-CM | POA: Diagnosis not present

## 2017-04-30 DIAGNOSIS — E784 Other hyperlipidemia: Secondary | ICD-10-CM | POA: Diagnosis not present

## 2017-04-30 DIAGNOSIS — D0590 Unspecified type of carcinoma in situ of unspecified breast: Secondary | ICD-10-CM | POA: Diagnosis not present

## 2017-04-30 DIAGNOSIS — D0220 Carcinoma in situ of unspecified bronchus and lung: Secondary | ICD-10-CM | POA: Diagnosis not present

## 2017-04-30 DIAGNOSIS — M6281 Muscle weakness (generalized): Secondary | ICD-10-CM | POA: Diagnosis not present

## 2017-05-01 ENCOUNTER — Encounter: Payer: Self-pay | Admitting: Nurse Practitioner

## 2017-05-06 ENCOUNTER — Non-Acute Institutional Stay (SKILLED_NURSING_FACILITY): Payer: Medicare HMO | Admitting: Nurse Practitioner

## 2017-05-06 ENCOUNTER — Encounter: Payer: Self-pay | Admitting: Nurse Practitioner

## 2017-05-06 DIAGNOSIS — K5900 Constipation, unspecified: Secondary | ICD-10-CM | POA: Diagnosis not present

## 2017-05-06 DIAGNOSIS — M8949 Other hypertrophic osteoarthropathy, multiple sites: Secondary | ICD-10-CM

## 2017-05-06 DIAGNOSIS — F411 Generalized anxiety disorder: Secondary | ICD-10-CM | POA: Diagnosis not present

## 2017-05-06 DIAGNOSIS — R69 Illness, unspecified: Secondary | ICD-10-CM | POA: Diagnosis not present

## 2017-05-06 DIAGNOSIS — I10 Essential (primary) hypertension: Secondary | ICD-10-CM

## 2017-05-06 DIAGNOSIS — M150 Primary generalized (osteo)arthritis: Secondary | ICD-10-CM | POA: Diagnosis not present

## 2017-05-06 DIAGNOSIS — D500 Iron deficiency anemia secondary to blood loss (chronic): Secondary | ICD-10-CM

## 2017-05-06 DIAGNOSIS — K219 Gastro-esophageal reflux disease without esophagitis: Secondary | ICD-10-CM

## 2017-05-06 DIAGNOSIS — I471 Supraventricular tachycardia: Secondary | ICD-10-CM | POA: Diagnosis not present

## 2017-05-06 DIAGNOSIS — E1165 Type 2 diabetes mellitus with hyperglycemia: Secondary | ICD-10-CM | POA: Diagnosis not present

## 2017-05-06 DIAGNOSIS — M159 Polyosteoarthritis, unspecified: Secondary | ICD-10-CM

## 2017-05-06 DIAGNOSIS — E871 Hypo-osmolality and hyponatremia: Secondary | ICD-10-CM | POA: Diagnosis not present

## 2017-05-06 NOTE — Assessment & Plan Note (Signed)
At her baseline, Na 133 04/30/17

## 2017-05-06 NOTE — Assessment & Plan Note (Signed)
Stabilizing, continue Mirtazapine 7.40m qhs, Cymbalta 281mdaily.

## 2017-05-06 NOTE — Progress Notes (Signed)
Location:  [facility] Room Number: 60 A Place of Service:  SNF (31) Provider: Kleber Crean,  [place]  NP  Estill Dooms, MD  Patient Care Team: Estill Dooms, MD as PCP - General (Internal Medicine) Lannette Donath Glo Herring., MD as Referring Physician (Internal Medicine) Satira Sark, MD as Consulting Physician (Cardiology) Kyung Rudd, MD as Consulting Physician (Radiation Oncology)  Extended Emergency Contact Information Primary Emergency Contact: Neita Garnet of [place] Phone: [phone] Relation: Son Secondary Emergency Contact: Scholler,Lance Address: [address]          [place], [place] [address] [name] of [place] Phone: [phone] Mobile Phone: [phone] Relation: Son  Code Status:  Full Code Goals of care: Advanced Directive information Advanced Directives 05/06/2017  Does Patient Have a Medical Advance Directive? Yes  Type of Paramedic of [place];Living will  Does patient want to make changes to medical advance directive? No - Patient declined  Copy of [place] in Chart? Yes  Pre-existing out of facility DNR order (yellow form or pink MOST form) -     Chief Complaint  Patient presents with  . Acute Visit    (L) great toe, pain, bruising, difficulty moving toe.    HPI:  Pt is a 83 y.o. female seen today for an acute visit for Left knee and foot pain, not new, no apparent injury, bruise, redness, or swelling.     Blood pressure is controlled, on Atenolol 12.66m daily. Takes Vit B12 19485IOEdaily, folic acid 4703JKKdaily, Fe, Hgb 11.7 04/02/17. Mood is table,  continue Mirtazapine 7.589mqhs, Cymbalta 2030maily.   Past Medical History:  Diagnosis Date  . Allergic rhinitis   . Anemia    NOS  . Anxiety   . Biceps tendon rupture    Bilateral  . Breast cancer ([facility]   Left s/p lumpectomy and XRT   . Bronchiectasis ([facility]   Noted on chest x-ray  . Cough  2014  . Depression   . Diverticulosis   . Elevated liver enzymes    Biopsy consistent with steatohepatitis  . Fundic gland polyps of stomach, benign   . GERD (gastroesophageal reflux disease) 06/2007   EGD Dr Rou[facility]ala Romne[facility], multiple fundic gland polyps  . Glaucoma 2013   both eyes  . Hemorrhoids   . Hiatal hernia   . Hx of radiation therapy 07/17/10 to 07/21/10   SRS LLL lung  . Hyperlipidemia   . Insomnia   . Low back pain    scoliosis  . Lung cancer ([facility]/2011   Left 2011 - rad x 3  . Lymphedema    Left arm  . Osteoarthritis   . Osteopenia 10/25/2016  . Osteoporosis, senile   . Overactive bladder   . Pneumonia    RLL with sepsis  . PSVT (paroxysmal supraventricular tachycardia) ([facility]CC[facility] . Rheumatic fever    Age [age] Right thyroid nodule   . Steatohepatitis    liver bx  . Type 2 diabetes mellitus ([facility]CC[place] [place]  Past Surgical History:  Procedure Laterality Date  . ABDOMINAL HYSTERECTOMY  1964  . APPENDECTOMY  1964  . BIOPSY THYROID  11/2008  . BREAST BIOPSY     X 3 w/ cystectomy  . BREAST LUMPECTOMY  2001  . CATARACT EXTRACTION Bilateral 2003   Lens implant Dr. EpeCharise Killian CHOLECYSTECTOMY  1965  . COLONOSCOPY  09/11/2011   Procedure: COLONOSCOPY;  Surgeon: Dorothyann Peng, MD;  [facility];  Service: Endoscopy;  Laterality: N/A;  . COLONOSCOPY  2005 /2012   Dr. Lucianne Muss- diverticulosis, ext hemorrhoids.  . CYSTOSCOPY  2007  . ESOPHAGOGASTRODUODENOSCOPY  09/11/2011   Procedure: ESOPHAGOGASTRODUODENOSCOPY (EGD);  Surgeon: Dorothyann Peng, MD;  [facility];  Service: Endoscopy;  Laterality: N/A;  . LIVER BIOPSY  2008  . LUNG BIOPSY Left 06/06/2010  . REVISION TOTAL HIP ARTHROPLASTY  2007   Left  . SKIN CANCER EXCISION     nose and left lower cheek  . TONSILLECTOMY  1930  . TOTAL HIP ARTHROPLASTY Bilateral 2005 & 2007    Dr. Earl Lagos. Aline Brochure     Allergies  Allergen Reactions  . Ibuprofen Hives  . Naproxen   . Statins Other (See Comments)     Feels like knives in stomach  . Surgilube [Gyne-Moistrin]     Rectal itching, burning  . Tape Rash    Outpatient Encounter Prescriptions as of 05/06/2017  Medication Sig  . acetaminophen (TYLENOL) 325 MG tablet Take 650 mg by mouth. Take 2 tablets every 6 hours as needed  . aspirin 81 MG EC tablet Take 81 mg by mouth daily.    . brimonidine (ALPHAGAN) 0.2 % ophthalmic solution Place 1 drop into both eyes 3 (three) times daily.  . cetirizine (ZYRTEC) 10 MG tablet Take 10 mg by mouth daily.  Marland Kitchen Dextromethorphan-Guaifenesin (ROBAFEN DM) 10-100 MG/5ML liquid Take 5 mLs by mouth. 10 ml by mouth every 6 hours as need for cough in 48 hours  . dorzolamide-timolol (COSOPT) 22.3-6.8 MG/ML ophthalmic solution 1 drop. One drop in each eye twice daily  . lactose free nutrition (BOOST) LIQD Take 237 mLs by mouth. Serve in cup over ice twice a day between meals  . magnesium hydroxide (MILK OF MAGNESIA) 400 MG/5ML suspension Take by mouth. 30 ml once a daily as needed for constipation  . mirtazapine (REMERON) 7.5 MG tablet Take 7.5 mg by mouth. Take one tablet at bedtime  . Multiple Vitamin (MULTIVITAMIN) capsule Take 1 capsule by mouth daily.   . Multiple Vitamins-Minerals (PRESERVISION AREDS 2 PO) Take by mouth. Take one capsule twice a day for vision  . omeprazole (PRILOSEC) 20 MG capsule Take 20 mg by mouth. Take one tablet daily  . vitamin B-12 (CYANOCOBALAMIN) 1000 MCG tablet Take 1,000 mcg by mouth daily.   No facility-administered encounter medications on file as of 05/06/2017.     Review of Systems  Constitutional: Negative for activity change, appetite change, chills, diaphoresis, fatigue, fever and unexpected weight change.  HENT: Positive for postnasal drip. Negative for congestion, ear discharge, ear pain, hearing loss, rhinorrhea, sore throat, tinnitus, trouble swallowing and voice change.   Eyes: Negative.  Negative for pain, discharge, redness, itching and visual disturbance.  Respiratory:  Positive for cough. Negative for choking, shortness of breath and wheezing.        Chronic hacking cough  Cardiovascular: Negative for chest pain, palpitations and leg swelling.  Gastrointestinal: Positive for diarrhea. Negative for abdominal distention, abdominal pain, constipation and nausea.       Resolved 10 day diarrhea in the past 24 hours.   Endocrine: Negative for cold intolerance, heat intolerance, polydipsia, polyphagia and polyuria.  Genitourinary: Negative for difficulty urinating, dysuria, flank pain, frequency, hematuria, pelvic pain, urgency and vaginal discharge.  Musculoskeletal: Positive for arthralgias, gait problem and myalgias. Negative for back pain, neck pain and neck stiffness.       Left knee  and foot pain,  pain worsens with walking.   Skin: Negative for color change, pallor and rash.  Allergic/Immunologic: Negative.   Neurological: Negative for dizziness, tremors, seizures, syncope, weakness, numbness and headaches.  Hematological: Negative for adenopathy. Does not bruise/bleed easily.  Psychiatric/Behavioral: Positive for sleep disturbance. Negative for agitation, behavioral problems, confusion, dysphoric mood, hallucinations and suicidal ideas. The patient is nervous/anxious. The patient is not hyperactive.     Immunization History  Administered Date(s) Administered  . Influenza Whole 09/22/2008, 09/16/2012, 09/30/2013  . Influenza-Unspecified 10/02/2014, 09/15/2015, 09/27/2016  . Pneumococcal Polysaccharide-23 08/17/2006  . Td 12/17/2005   Pertinent  Health Maintenance Due  Topic Date Due  . FOOT EXAM  07/19/1931  . OPHTHALMOLOGY EXAM  07/19/1931  . PNA vac Low Risk Adult (2 of 2 - PCV13) 08/18/2007  . URINE MICROALBUMIN  04/30/2008  . INFLUENZA VACCINE  07/17/2017  . HEMOGLOBIN A1C  10/17/2017  . DEXA SCAN  Completed   Fall Risk  05/17/2016 04/20/2016 04/19/2016  Falls in the past year? No No No   Functional Status Survey:    Vitals:   05/06/17 1551    BP: 126/82  Pulse: 66  Resp: 16  Temp: 99.3 F (37.4 C)  Weight: 151 lb 9.6 oz (68.8 kg)  Height: 5' 5"  (1.651 m)   Body mass index is 25.23 kg/m. Physical Exam  Constitutional: She is oriented to person, place, and time. She appears well-developed and well-nourished. No distress.  HENT:  Head: Normocephalic and atraumatic.  Mouth/Throat: Oropharynx is clear and moist. No oropharyngeal exudate.  White coated tongue  Eyes: Conjunctivae are normal. Pupils are equal, round, and reactive to light.  Neck: Normal range of motion. Neck supple.  Cardiovascular: Normal rate, regular rhythm and normal heart sounds.   Occasional PACs  Pulmonary/Chest: Effort normal and breath sounds normal. No respiratory distress. She has no wheezes. She has no rales.  Abdominal: Soft. Bowel sounds are normal.  Genitourinary: Rectal exam shows guaiac negative stool.  Musculoskeletal: Normal range of motion. She exhibits no edema or tenderness.  Left knee and foot pain, not new, no apparent injury, bruise, redness, or swelling.   Neurological: She is alert and oriented to person, place, and time.  Skin: Skin is warm and dry. She is not diaphoretic.  Left parietal area skin cancer removal scar Left MTJ bruise/trauma from shower   Psychiatric: She has a normal mood and affect.    Labs reviewed:  Recent Labs  11/23/16 0730  04/04/17 04/16/17 04/23/17  NA 135  < > 132* 129* 130*  K 4.3  < > 4.2 4.8 4.6  CL 98  --   --   --   --   CO2 29  --   --   --   --   GLUCOSE 74  --   --   --   --   BUN 15  < > 6 12 23*  CREATININE 1.02*  < > 0.9 0.8 1.1  CALCIUM 9.7  --   --   --   --   < > = values in this interval not displayed.  Recent Labs  04/02/17 04/16/17 04/23/17  AST 24 17 19   ALT 17 16 21   ALKPHOS 94 87 100    Recent Labs  04/02/17 04/16/17 04/23/17  WBC 4.1 8.8 11.9  HGB 11.7* 10.1* 10.7*  HCT 35* 31* 32*  PLT 216 310 366   Lab Results  Component Value Date   TSH 33.00 (A)  04/16/2017   Lab Results  Component Value Date   HGBA1C 5.3 04/16/2017   Lab Results  Component Value Date   CHOL 188 04/12/2016   HDL 62 04/12/2016   LDLCALC 102 04/12/2016   TRIG 122 04/12/2016   CHOLHDL 3.0 Ratio 06/07/2008    Significant Diagnostic Results in last 30 days:  No results found.  Assessment/Plan Osteoarthritis Left knee and foot pain with walking, no warmth or significant swelling, 05/01/17 dc Tylenol bid per Pt's request. Apply biofreeze qid to the left knee and foot.   Hyponatremia At her baseline, Na 133 04/30/17  Anxiety state Stabilizing, continue Mirtazapine 7.76m qhs, Cymbalta 236mdaily.   HTN (hypertension) Controlled, continue Atenolol 12.79m32md, monitor Bp  PSVT (paroxysmal supraventricular tachycardia) (HCC) Heart rate is in control, continue  Atenolol 12.79mg16m, 04/15/17 EKG SR PAC  GERD  Stable, continue Omeprazole 20mg81mly.   Constipation Stable.   Type 2 diabetes mellitus with hyperglycemia (HCC) 04/22/17 Hgb a1c 5.3, diet controlled. Dc CBG daily  Anemia 04/30/17 Hgb 10.7, continue Fe, B12, Folate   Family/ staff Communication: SNF  Labs/tests ordered:  Weekly CMP

## 2017-05-06 NOTE — Assessment & Plan Note (Signed)
Controlled, continue Atenolol 12.64m qd, monitor Bp

## 2017-05-06 NOTE — Assessment & Plan Note (Signed)
04/30/17 Hgb 10.7, continue Fe, B12, Folate

## 2017-05-06 NOTE — Assessment & Plan Note (Signed)
Stable

## 2017-05-06 NOTE — Assessment & Plan Note (Signed)
Stable, continue Omeprazole 24m daily.

## 2017-05-06 NOTE — Assessment & Plan Note (Addendum)
04/22/17 Hgb a1c 5.3, diet controlled. Dc CBG daily

## 2017-05-06 NOTE — Assessment & Plan Note (Signed)
Heart rate is in control, continue  Atenolol 12.71m qd, 04/15/17 EKG SR PAC

## 2017-05-06 NOTE — Assessment & Plan Note (Addendum)
Left knee and foot pain with walking, no warmth or significant swelling, 05/01/17 dc Tylenol bid per Pt's request. Apply biofreeze qid to the left knee and foot.

## 2017-05-07 DIAGNOSIS — I10 Essential (primary) hypertension: Secondary | ICD-10-CM | POA: Diagnosis not present

## 2017-05-07 DIAGNOSIS — D0590 Unspecified type of carcinoma in situ of unspecified breast: Secondary | ICD-10-CM | POA: Diagnosis not present

## 2017-05-07 DIAGNOSIS — E784 Other hyperlipidemia: Secondary | ICD-10-CM | POA: Diagnosis not present

## 2017-05-07 DIAGNOSIS — M545 Low back pain: Secondary | ICD-10-CM | POA: Diagnosis not present

## 2017-05-07 DIAGNOSIS — D0220 Carcinoma in situ of unspecified bronchus and lung: Secondary | ICD-10-CM | POA: Diagnosis not present

## 2017-05-07 DIAGNOSIS — M6281 Muscle weakness (generalized): Secondary | ICD-10-CM | POA: Diagnosis not present

## 2017-05-07 LAB — HEPATIC FUNCTION PANEL
ALK PHOS: 139 U/L — AB (ref 25–125)
ALT: 50 U/L — AB (ref 7–35)
AST: 45 U/L — AB (ref 13–35)
BILIRUBIN, TOTAL: 0.6 mg/dL

## 2017-05-07 LAB — BASIC METABOLIC PANEL
BUN: 21 mg/dL (ref 4–21)
Creatinine: 0.8 mg/dL (ref <=1.1)
GLUCOSE: 79 mg/dL
Potassium: 4.5 mmol/L (ref 3.4–5.3)
SODIUM: 127 mmol/L — AB (ref 137–147)

## 2017-05-07 LAB — CBC AND DIFFERENTIAL
HEMATOCRIT: 29 % — AB (ref 36–46)
HEMOGLOBIN: 10 g/dL — AB (ref 12.0–16.0)
Platelets: 266 10*3/uL (ref 150–399)
WBC: 7.7 10^3/mL

## 2017-05-09 ENCOUNTER — Other Ambulatory Visit: Payer: Self-pay | Admitting: *Deleted

## 2017-05-14 DIAGNOSIS — D0220 Carcinoma in situ of unspecified bronchus and lung: Secondary | ICD-10-CM | POA: Diagnosis not present

## 2017-05-14 DIAGNOSIS — M545 Low back pain: Secondary | ICD-10-CM | POA: Diagnosis not present

## 2017-05-14 DIAGNOSIS — M6281 Muscle weakness (generalized): Secondary | ICD-10-CM | POA: Diagnosis not present

## 2017-05-14 DIAGNOSIS — I10 Essential (primary) hypertension: Secondary | ICD-10-CM | POA: Diagnosis not present

## 2017-05-14 DIAGNOSIS — D0590 Unspecified type of carcinoma in situ of unspecified breast: Secondary | ICD-10-CM | POA: Diagnosis not present

## 2017-05-14 DIAGNOSIS — E784 Other hyperlipidemia: Secondary | ICD-10-CM | POA: Diagnosis not present

## 2017-05-14 LAB — HEPATIC FUNCTION PANEL
ALT: 31 U/L (ref 7–35)
AST: 21 U/L (ref 13–35)
Alkaline Phosphatase: 111 U/L (ref 25–125)
Bilirubin, Total: 0.3 mg/dL

## 2017-05-14 LAB — BASIC METABOLIC PANEL
BUN: 27 mg/dL — AB (ref 4–21)
Creatinine: 1 mg/dL (ref <=1.1)
GLUCOSE: 72 mg/dL
Potassium: 4.9 mmol/L (ref 3.4–5.3)
Sodium: 132 mmol/L — AB (ref 137–147)

## 2017-05-14 LAB — CBC AND DIFFERENTIAL
HCT: 30 % — AB (ref 36–46)
Hemoglobin: 9.8 g/dL — AB (ref 12.0–16.0)
Platelets: 401 10*3/uL — AB (ref 150–399)
WBC: 8.1 10^3/mL

## 2017-05-15 ENCOUNTER — Encounter: Payer: Self-pay | Admitting: Nurse Practitioner

## 2017-05-15 ENCOUNTER — Non-Acute Institutional Stay (SKILLED_NURSING_FACILITY): Payer: Medicare HMO | Admitting: Nurse Practitioner

## 2017-05-15 DIAGNOSIS — I10 Essential (primary) hypertension: Secondary | ICD-10-CM

## 2017-05-15 DIAGNOSIS — K5900 Constipation, unspecified: Secondary | ICD-10-CM

## 2017-05-15 DIAGNOSIS — N318 Other neuromuscular dysfunction of bladder: Secondary | ICD-10-CM

## 2017-05-15 DIAGNOSIS — D500 Iron deficiency anemia secondary to blood loss (chronic): Secondary | ICD-10-CM | POA: Diagnosis not present

## 2017-05-15 DIAGNOSIS — B370 Candidal stomatitis: Secondary | ICD-10-CM

## 2017-05-15 DIAGNOSIS — E1165 Type 2 diabetes mellitus with hyperglycemia: Secondary | ICD-10-CM | POA: Diagnosis not present

## 2017-05-15 DIAGNOSIS — F411 Generalized anxiety disorder: Secondary | ICD-10-CM

## 2017-05-15 DIAGNOSIS — R531 Weakness: Secondary | ICD-10-CM | POA: Diagnosis not present

## 2017-05-15 DIAGNOSIS — M150 Primary generalized (osteo)arthritis: Secondary | ICD-10-CM | POA: Diagnosis not present

## 2017-05-15 DIAGNOSIS — E871 Hypo-osmolality and hyponatremia: Secondary | ICD-10-CM | POA: Diagnosis not present

## 2017-05-15 DIAGNOSIS — I471 Supraventricular tachycardia: Secondary | ICD-10-CM

## 2017-05-15 DIAGNOSIS — K219 Gastro-esophageal reflux disease without esophagitis: Secondary | ICD-10-CM | POA: Diagnosis not present

## 2017-05-15 DIAGNOSIS — M159 Polyosteoarthritis, unspecified: Secondary | ICD-10-CM

## 2017-05-15 DIAGNOSIS — M8949 Other hypertrophic osteoarthropathy, multiple sites: Secondary | ICD-10-CM

## 2017-05-15 NOTE — Assessment & Plan Note (Signed)
Controlled, continue Atenolol 12.77m qd,

## 2017-05-15 NOTE — Assessment & Plan Note (Signed)
05/14/17 Na 132

## 2017-05-15 NOTE — Progress Notes (Signed)
Location:  [facility] Room Number: 29 Place of Service:  SNF (31) Provider:  Mast, Manxie  NP  Estill Dooms, MD  Patient Care Team: Estill Dooms, MD as PCP - General (Internal Medicine) Lanelle Bal., MD as Referring Physician (Internal Medicine) Satira Sark, MD as Consulting Physician (Cardiology) Kyung Rudd, MD as Consulting Physician (Radiation Oncology)  Extended Emergency Contact Information Primary Emergency Contact: Neita Garnet of [place] Phone: [phone] Relation: Son Secondary Emergency Contact: Handy,Lance Address: [address]          [place], [place] [address] [name] of [place] Phone: [phone] Mobile Phone: [phone] Relation: Son  Code Status:  Full Code Goals of care: Advanced Directive information Advanced Directives 05/15/2017  Does Patient Have a Medical Advance Directive? Yes  Type of Paramedic of [place];Living will  Does patient want to make changes to medical advance directive? No - Patient declined  Copy of [place] in Chart? Yes  Pre-existing out of facility DNR order (yellow form or pink MOST form) -     Chief Complaint  Patient presents with  . Acute Visit    Tongue burning    HPI:  Pt is a 83 y.o. female seen today for an acute visit for    Past Medical History:  Diagnosis Date  . Allergic rhinitis   . Anemia    NOS  . Anxiety   . Biceps tendon rupture    Bilateral  . Breast cancer ([place]) 2001   Left s/p lumpectomy and XRT   . Bronchiectasis ([place]) 2014   Noted on chest x-ray  . Cough 2014  . Depression   . Diverticulosis   . Elevated liver enzymes    Biopsy consistent with steatohepatitis  . Fundic gland polyps of stomach, benign   . GERD (gastroesophageal reflux disease) 06/2007   EGD Dr Gala Romney >[facility], multiple fundic gland polyps  . Glaucoma 2013   both eyes  . Hemorrhoids   . Hiatal hernia   .  Hx of radiation therapy 07/17/10 to 07/21/10   SRS LLL lung  . Hyperlipidemia   . Insomnia   . Low back pain    scoliosis  . Lung cancer ([place]) 05/2010   Left 2011 - rad x 3  . Lymphedema    Left arm  . Osteoarthritis   . Osteopenia 10/25/2016  . Osteoporosis, senile   . Overactive bladder   . Pneumonia    RLL with sepsis  . PSVT (paroxysmal supraventricular tachycardia) ([place])   . Rheumatic fever    Age 4  . Right thyroid nodule   . Steatohepatitis    liver bx  . Type 2 diabetes mellitus ([place])    Past Surgical History:  Procedure Laterality Date  . ABDOMINAL HYSTERECTOMY  1964  . APPENDECTOMY  1964  . BIOPSY THYROID  11/2008  . BREAST BIOPSY     X 3 w/ cystectomy  . BREAST LUMPECTOMY  2001  . CATARACT EXTRACTION Bilateral 2003   Lens implant Dr. Charise Killian  . CHOLECYSTECTOMY  1965  . COLONOSCOPY  09/11/2011   Procedure: COLONOSCOPY;  Surgeon: Dorothyann Peng, MD;  [facility];  Service: Endoscopy;  Laterality: N/A;  . COLONOSCOPY  2005 /2012   Dr. Lucianne Muss- diverticulosis, ext hemorrhoids.  . CYSTOSCOPY  2007  . ESOPHAGOGASTRODUODENOSCOPY  09/11/2011   Procedure: ESOPHAGOGASTRODUODENOSCOPY (EGD);  Surgeon: Dorothyann Peng, MD;  [facility];  Service: Endoscopy;  Laterality: N/A;  . LIVER BIOPSY  2008  . LUNG BIOPSY Left 06/06/2010  . REVISION TOTAL HIP ARTHROPLASTY  2007   Left  . SKIN CANCER EXCISION     nose and left lower cheek  . TONSILLECTOMY  1930  . TOTAL HIP ARTHROPLASTY Bilateral 2005 & 2007    Dr. Earl Lagos. Aline Brochure     Allergies  Allergen Reactions  . Ibuprofen Hives  . Naproxen   . Statins Other (See Comments)    Feels like knives in stomach  . Surgilube [Gyne-Moistrin]     Rectal itching, burning  . Tape Rash    Outpatient Encounter Prescriptions as of 05/15/2017  Medication Sig  . acetaminophen (TYLENOL) 325 MG tablet Take 650 mg by mouth. Take 2 tablets every 6 hours as needed  . aspirin 81 MG EC tablet Take 81 mg by mouth daily.     . brimonidine (ALPHAGAN) 0.2 % ophthalmic solution Place 1 drop into both eyes 3 (three) times daily.  . cetirizine (ZYRTEC) 10 MG tablet Take 10 mg by mouth daily.  Marland Kitchen Dextromethorphan-Guaifenesin (ROBAFEN DM) 10-100 MG/5ML liquid Take 5 mLs by mouth. 10 ml by mouth every 6 hours as need for cough in 48 hours  . dorzolamide-timolol (COSOPT) 22.3-6.8 MG/ML ophthalmic solution 1 drop. One drop in each eye twice daily  . DULoxetine (CYMBALTA) 20 MG capsule Take 20 mg by mouth daily.  Marland Kitchen lactose free nutrition (BOOST) LIQD Take 237 mLs by mouth. Serve in cup over ice twice a day between meals  . magnesium hydroxide (MILK OF MAGNESIA) 400 MG/5ML suspension Take by mouth. 30 ml once a daily as needed for constipation  . mirtazapine (REMERON) 7.5 MG tablet Take 7.5 mg by mouth. Take one tablet at bedtime  . Multiple Vitamin (MULTIVITAMIN) capsule Take 1 capsule by mouth daily.   . Multiple Vitamins-Minerals (PRESERVISION AREDS 2 PO) Take by mouth. Take one capsule twice a day for vision  . omeprazole (PRILOSEC) 20 MG capsule Take 20 mg by mouth. Take one tablet daily  . vitamin B-12 (CYANOCOBALAMIN) 1000 MCG tablet Take 1,000 mcg by mouth daily.   No facility-administered encounter medications on file as of 05/15/2017.     Review of Systems  Immunization History  Administered Date(s) Administered  . Influenza Whole 09/22/2008, 09/16/2012, 09/30/2013  . Influenza-Unspecified 10/02/2014, 09/15/2015, 09/27/2016  . Pneumococcal Polysaccharide-23 08/17/2006  . Td 12/17/2005   Pertinent  Health Maintenance Due  Topic Date Due  . FOOT EXAM  07/19/1931  . OPHTHALMOLOGY EXAM  07/19/1931  . PNA vac Low Risk Adult (2 of 2 - PCV13) 08/18/2007  . URINE MICROALBUMIN  04/30/2008  . INFLUENZA VACCINE  07/17/2017  . HEMOGLOBIN A1C  10/17/2017  . DEXA SCAN  Completed   Fall Risk  05/17/2016 04/20/2016 04/19/2016  Falls in the past year? No No No   Functional Status Survey:    Vitals:   05/15/17 1319    BP: 130/80  Pulse: 76  Resp: 18  Temp: 99.1 F (37.3 C)  Weight: 156 lb 3.2 oz (70.9 kg)  Height: 5' 5"  (1.651 m)   Body mass index is 25.99 kg/m. Physical Exam  Labs reviewed:  Recent Labs  11/23/16 0730  04/23/17 05/07/17 05/14/17  NA 135  < > 130* 127* 132*  K 4.3  < > 4.6 4.5 4.9  CL 98  --   --   --   --   CO2 29  --   --   --   --  GLUCOSE 74  --   --   --   --   BUN 15  < > 23* 21 27*  CREATININE 1.02*  < > 1.1 0.8 1.0  CALCIUM 9.7  --   --   --   --   < > = values in this interval not displayed.  Recent Labs  04/23/17 05/07/17 05/14/17  AST 19 45* 21  ALT 21 50* 31  ALKPHOS 100 139* 111    Recent Labs  04/23/17 05/07/17 05/14/17  WBC 11.9 7.7 8.1  HGB 10.7* 10.0* 9.8*  HCT 32* 29* 30*  PLT 366 266 401*   Lab Results  Component Value Date   TSH 33.00 (A) 04/16/2017   Lab Results  Component Value Date   HGBA1C 5.3 04/16/2017   Lab Results  Component Value Date   CHOL 188 04/12/2016   HDL 62 04/12/2016   LDLCALC 102 04/12/2016   TRIG 122 04/12/2016   CHOLHDL 3.0 Ratio 06/07/2008    Significant Diagnostic Results in last 30 days:  No results found.  Assessment/Plan There are no diagnoses linked to this encounter.   Family/ staff Communication:   Labs/tests ordered:

## 2017-05-15 NOTE — Assessment & Plan Note (Signed)
Heart rate is in control, continue  Atenolol 12.52m qd, 04/15/17 EKG SR PAC

## 2017-05-15 NOTE — Progress Notes (Signed)
Location:  [facility] Room Number: 32 Place of Service:  SNF (31) Provider: Yordi Krager,  [place]  NP  Estill Dooms, MD  Patient Care Team: Estill Dooms, MD as PCP - General (Internal Medicine) Lanelle Bal., MD as Referring Physician (Internal Medicine) Satira Sark, MD as Consulting Physician (Cardiology) Kyung Rudd, MD as Consulting Physician (Radiation Oncology)  Extended Emergency Contact Information Primary Emergency Contact: Neita Garnet of [place] Phone: [phone] Relation: Son Secondary Emergency Contact: Liese,Lance Address: [address]          [place], [place] [address] [name] of [place] Phone: [phone] Mobile Phone: [phone] Relation: Son  Code Status:  Full Code Goals of care: Advanced Directive information Advanced Directives 05/15/2017  Does Patient Have a Medical Advance Directive? Yes  Type of Paramedic of [place];Living will  Does patient want to make changes to medical advance directive? No - Patient declined  Copy of [place] in Chart? Yes  Pre-existing out of facility DNR order (yellow form or pink MOST form) -     Chief Complaint  Patient presents with  . Acute Visit    Tongue burning    HPI:  Pt is a 83 y.o. female seen today for burning sensation when she eats excessive salt, otherwise she has no complaint.    Blood pressure is controlled, on Atenolol 12.87m daily. Takes Vit B12 15027XAJdaily, folic acid 4287OMVdaily, off Fe for a month,  Hgb 9.8 05/14/17, gradual dropping, last Hgb 10.0 05/07/17,  Mood is table,  continue Mirtazapine 7.531mqhs, Cymbalta 2046maily.   Past Medical History:  Diagnosis Date  . Allergic rhinitis   . Anemia    NOS  . Anxiety   . Biceps tendon rupture    Bilateral  . Breast cancer ([facility]001   Left s/p lumpectomy and XRT   . Bronchiectasis ([facility]CC[place]014   Noted on chest x-ray  . Cough 2014    . Depression   . Diverticulosis   . Elevated liver enzymes    Biopsy consistent with steatohepatitis  . Fundic gland polyps of stomach, benign   . GERD (gastroesophageal reflux disease) 06/2007   EGD Dr Rou[facility]ala Rom[facility], multiple fundic gland polyps  . Glaucoma 2013   both eyes  . Hemorrhoids   . Hiatal hernia   . Hx of radiation therapy 07/17/10 to 07/21/10   SRS LLL lung  . Hyperlipidemia   . Insomnia   . Low back pain    scoliosis  . Lung cancer ([facility]CC[place]/2011   Left 2011 - rad x 3  . Lymphedema    Left arm  . Osteoarthritis   . Osteopenia 10/25/2016  . Osteoporosis, senile   . Overactive bladder   . Pneumonia    RLL with sepsis  . PSVT (paroxysmal supraventricular tachycardia) (H[place] . Rheumatic fever    Age [age] Right thyroid nodule   . Steatohepatitis    liver bx  . Type 2 diabetes mellitus (HCC[facility]  Past Surgical History:  Procedure Laterality Date  . ABDOMINAL HYSTERECTOMY  1964  . APPENDECTOMY  1964  . BIOPSY THYROID  11/2008  . BREAST BIOPSY     X 3 w/ cystectomy  . BREAST LUMPECTOMY  2001  . CATARACT EXTRACTION Bilateral 2003   Lens implant Dr. EpeCharise Killian CHOLECYSTECTOMY  1965  . COLONOSCOPY  09/11/2011   Procedure: COLONOSCOPY;  Surgeon: Dorothyann Peng, MD;  [facility];  Service: Endoscopy;  Laterality: N/A;  . COLONOSCOPY  2005 /2012   Dr. Lucianne Muss- diverticulosis, ext hemorrhoids.  . CYSTOSCOPY  2007  . ESOPHAGOGASTRODUODENOSCOPY  09/11/2011   Procedure: ESOPHAGOGASTRODUODENOSCOPY (EGD);  Surgeon: Dorothyann Peng, MD;  [facility];  Service: Endoscopy;  Laterality: N/A;  . LIVER BIOPSY  2008  . LUNG BIOPSY Left 06/06/2010  . REVISION TOTAL HIP ARTHROPLASTY  2007   Left  . SKIN CANCER EXCISION     nose and left lower cheek  . TONSILLECTOMY  1930  . TOTAL HIP ARTHROPLASTY Bilateral 2005 & 2007    Dr. Earl Lagos. Aline Brochure     Allergies  Allergen Reactions  . Ibuprofen Hives  . Naproxen   . Statins Other (See Comments)    Feels  like knives in stomach  . Surgilube [Gyne-Moistrin]     Rectal itching, burning  . Tape Rash    Outpatient Encounter Prescriptions as of 05/15/2017  Medication Sig  . acetaminophen (TYLENOL) 325 MG tablet Take 650 mg by mouth. Take 2 tablets every 6 hours as needed  . aspirin 81 MG EC tablet Take 81 mg by mouth daily.    . brimonidine (ALPHAGAN) 0.2 % ophthalmic solution Place 1 drop into both eyes 3 (three) times daily.  . cetirizine (ZYRTEC) 10 MG tablet Take 10 mg by mouth daily.  Marland Kitchen Dextromethorphan-Guaifenesin (ROBAFEN DM) 10-100 MG/5ML liquid Take 5 mLs by mouth. 10 ml by mouth every 6 hours as need for cough in 48 hours  . dorzolamide-timolol (COSOPT) 22.3-6.8 MG/ML ophthalmic solution 1 drop. One drop in each eye twice daily  . DULoxetine (CYMBALTA) 20 MG capsule Take 20 mg by mouth daily.  Marland Kitchen lactose free nutrition (BOOST) LIQD Take 237 mLs by mouth. Serve in cup over ice twice a day between meals  . magnesium hydroxide (MILK OF MAGNESIA) 400 MG/5ML suspension Take by mouth. 30 ml once a daily as needed for constipation  . mirtazapine (REMERON) 7.5 MG tablet Take 7.5 mg by mouth. Take one tablet at bedtime  . Multiple Vitamin (MULTIVITAMIN) capsule Take 1 capsule by mouth daily.   . Multiple Vitamins-Minerals (PRESERVISION AREDS 2 PO) Take by mouth. Take one capsule twice a day for vision  . omeprazole (PRILOSEC) 20 MG capsule Take 20 mg by mouth. Take one tablet daily  . vitamin B-12 (CYANOCOBALAMIN) 1000 MCG tablet Take 1,000 mcg by mouth daily.   No facility-administered encounter medications on file as of 05/15/2017.     Review of Systems  Constitutional: Negative for activity change, appetite change, chills, diaphoresis, fatigue, fever and unexpected weight change.  HENT: Negative for congestion, ear discharge, ear pain, hearing loss, postnasal drip, rhinorrhea, sore throat, tinnitus, trouble swallowing and voice change.   Eyes: Negative.  Negative for pain, discharge, redness,  itching and visual disturbance.  Respiratory: Negative for cough, choking, shortness of breath and wheezing.        Chronic hacking cough  Cardiovascular: Negative for chest pain, palpitations and leg swelling.  Gastrointestinal: Positive for diarrhea. Negative for abdominal distention, abdominal pain, constipation and nausea.  Endocrine: Negative for cold intolerance, heat intolerance, polydipsia, polyphagia and polyuria.  Genitourinary: Negative for difficulty urinating, dysuria, flank pain, frequency, hematuria, pelvic pain, urgency and vaginal discharge.  Musculoskeletal: Positive for arthralgias, gait problem and myalgias. Negative for back pain, neck pain and neck stiffness.       Left knee and foot pain,  pain worsens with  walking.   Skin: Negative for color change, pallor and rash.  Allergic/Immunologic: Negative.   Neurological: Negative for dizziness, tremors, seizures, syncope, weakness, numbness and headaches.  Hematological: Negative for adenopathy. Does not bruise/bleed easily.  Psychiatric/Behavioral: Positive for sleep disturbance. Negative for agitation, behavioral problems, confusion, dysphoric mood, hallucinations and suicidal ideas. The patient is nervous/anxious. The patient is not hyperactive.     Immunization History  Administered Date(s) Administered  . Influenza Whole 09/22/2008, 09/16/2012, 09/30/2013  . Influenza-Unspecified 10/02/2014, 09/15/2015, 09/27/2016  . Pneumococcal Polysaccharide-23 08/17/2006  . Td 12/17/2005   Pertinent  Health Maintenance Due  Topic Date Due  . FOOT EXAM  07/19/1931  . OPHTHALMOLOGY EXAM  07/19/1931  . PNA vac Low Risk Adult (2 of 2 - PCV13) 08/18/2007  . URINE MICROALBUMIN  04/30/2008  . INFLUENZA VACCINE  07/17/2017  . HEMOGLOBIN A1C  10/17/2017  . DEXA SCAN  Completed   Fall Risk  05/17/2016 04/20/2016 04/19/2016  Falls in the past year? No No No   Functional Status Survey:    Vitals:   05/15/17 1319  BP: 130/80  Pulse:  76  Resp: 18  Temp: 99.1 F (37.3 C)  Weight: 156 lb 3.2 oz (70.9 kg)  Height: 5' 5"  (1.651 m)   Body mass index is 25.99 kg/m. Physical Exam  Constitutional: She is oriented to person, place, and time. She appears well-developed and well-nourished. No distress.  HENT:  Head: Normocephalic and atraumatic.  Mouth/Throat: Oropharynx is clear and moist. No oropharyngeal exudate.  White coated tongue  Eyes: Conjunctivae are normal. Pupils are equal, round, and reactive to light.  Neck: Normal range of motion. Neck supple.  Cardiovascular: Normal rate, regular rhythm and normal heart sounds.   Occasional PACs  Pulmonary/Chest: Effort normal and breath sounds normal. No respiratory distress. She has no wheezes. She has no rales.  Abdominal: Soft. Bowel sounds are normal.  Genitourinary: Rectal exam shows guaiac negative stool.  Musculoskeletal: Normal range of motion. She exhibits no edema or tenderness.  Left knee and foot pain, not new, no apparent injury, bruise, redness, or swelling.   Neurological: She is alert and oriented to person, place, and time.  Skin: Skin is warm and dry. She is not diaphoretic.  Left parietal area skin cancer removal scar Left MTJ bruise/trauma from shower   Psychiatric: She has a normal mood and affect.    Labs reviewed:  Recent Labs  11/23/16 0730  04/23/17 05/07/17 05/14/17  NA 135  < > 130* 127* 132*  K 4.3  < > 4.6 4.5 4.9  CL 98  --   --   --   --   CO2 29  --   --   --   --   GLUCOSE 74  --   --   --   --   BUN 15  < > 23* 21 27*  CREATININE 1.02*  < > 1.1 0.8 1.0  CALCIUM 9.7  --   --   --   --   < > = values in this interval not displayed.  Recent Labs  04/23/17 05/07/17 05/14/17  AST 19 45* 21  ALT 21 50* 31  ALKPHOS 100 139* 111    Recent Labs  04/23/17 05/07/17 05/14/17  WBC 11.9 7.7 8.1  HGB 10.7* 10.0* 9.8*  HCT 32* 29* 30*  PLT 366 266 401*   Lab Results  Component Value Date   TSH 33.00 (A) 04/16/2017   Lab  Results  Component Value  Date   HGBA1C 5.3 04/16/2017   Lab Results  Component Value Date   CHOL 188 04/12/2016   HDL 62 04/12/2016   LDLCALC 102 04/12/2016   TRIG 122 04/12/2016   CHOLHDL 3.0 Ratio 06/07/2008    Significant Diagnostic Results in last 30 days:  No results found.  Assessment/Plan Anemia 04/30/17 Hgb 10.7, 05/14/17 Na 132, K 4.9, Bun 27, creat 0.95, wbc 8.1, Hgb 9.8, plt 401 continue Fe, B12, Folate, resume Ferrous Sulfate 369m po daily, continue CBC BMP weekly for now.    HTN (hypertension) Controlled, continue Atenolol 12.529mqd,  PSVT (paroxysmal supraventricular tachycardia) (HCC) Heart rate is in control, continue  Atenolol 12.65m16md, 04/15/17 EKG SR PAC  GERD  Stable, continue Omeprazole 21m38mily.   Constipation Stable.  Candidiasis of mouth Resolved, only tongue burning sensation with excessive salt intake  Type 2 diabetes mellitus with hyperglycemia (HCC)[place]7/18 Hgb a1c 5.3, diet controlled  Osteoarthritis 05/01/17 dc Tylenol bid per Pt's request.  05/09/17 Tylenol 650mg89m per pt's request.   OVERACTIVE BLADDER Urinary dribbling. Adult brief  Hyponatremia 05/14/17 Na 132  Anxiety state Stabilizing, continue Mirtazapine 7.65mg q97m Cymbalta 21mg d43m.   Generalized weakness Continue to improve.    Family/ staff Communication: SNF  Labs/tests ordered:  Weekly CMP, CBC

## 2017-05-15 NOTE — Assessment & Plan Note (Signed)
Stabilizing, continue Mirtazapine 7.92m qhs, Cymbalta 251mdaily.

## 2017-05-15 NOTE — Assessment & Plan Note (Signed)
04/30/17 Hgb 10.7, 05/14/17 Na 132, K 4.9, Bun 27, creat 0.95, wbc 8.1, Hgb 9.8, plt 401 continue Fe, B12, Folate, resume Ferrous Sulfate 339m po daily, continue CBC BMP weekly for now.

## 2017-05-15 NOTE — Assessment & Plan Note (Signed)
Continue to improve.

## 2017-05-15 NOTE — Assessment & Plan Note (Signed)
Resolved, only tongue burning sensation with excessive salt intake

## 2017-05-15 NOTE — Assessment & Plan Note (Signed)
Stable, continue Omeprazole 56m daily.

## 2017-05-15 NOTE — Assessment & Plan Note (Signed)
Stable

## 2017-05-15 NOTE — Assessment & Plan Note (Signed)
05/01/17 dc Tylenol bid per Pt's request.  05/09/17 Tylenol 621m bid per pt's request.

## 2017-05-15 NOTE — Assessment & Plan Note (Signed)
Urinary dribbling. Adult brief

## 2017-05-15 NOTE — Assessment & Plan Note (Signed)
04/22/17 Hgb a1c 5.3, diet controlled

## 2017-05-21 ENCOUNTER — Other Ambulatory Visit: Payer: Self-pay | Admitting: *Deleted

## 2017-05-21 DIAGNOSIS — D509 Iron deficiency anemia, unspecified: Secondary | ICD-10-CM | POA: Diagnosis not present

## 2017-05-21 LAB — BASIC METABOLIC PANEL
BUN: 28 mg/dL — AB (ref 4–21)
Creatinine: 0.9 mg/dL (ref <=1.1)
Glucose: 73 mg/dL
Potassium: 4.9 mmol/L (ref 3.4–5.3)
SODIUM: 132 mmol/L — AB (ref 137–147)

## 2017-05-21 LAB — HEPATIC FUNCTION PANEL
ALT: 18 U/L (ref 7–35)
AST: 14 U/L (ref 13–35)
Alkaline Phosphatase: 123 U/L (ref 25–125)
BILIRUBIN, TOTAL: 0.3 mg/dL

## 2017-05-21 LAB — CBC AND DIFFERENTIAL
HEMATOCRIT: 30 % — AB (ref 36–46)
HEMOGLOBIN: 10.3 g/dL — AB (ref 12.0–16.0)
Platelets: 411 10*3/uL — AB (ref 150–399)
WBC: 8.4 10*3/mL

## 2017-05-23 ENCOUNTER — Encounter: Payer: Self-pay | Admitting: Nurse Practitioner

## 2017-05-28 ENCOUNTER — Encounter: Payer: Self-pay | Admitting: Internal Medicine

## 2017-05-28 DIAGNOSIS — D509 Iron deficiency anemia, unspecified: Secondary | ICD-10-CM | POA: Diagnosis not present

## 2017-05-28 LAB — HEPATIC FUNCTION PANEL: ALK PHOS: 90 (ref 25–125)

## 2017-06-04 ENCOUNTER — Encounter: Payer: Self-pay | Admitting: Nurse Practitioner

## 2017-06-04 ENCOUNTER — Non-Acute Institutional Stay (SKILLED_NURSING_FACILITY): Payer: Medicare HMO | Admitting: Nurse Practitioner

## 2017-06-04 DIAGNOSIS — K219 Gastro-esophageal reflux disease without esophagitis: Secondary | ICD-10-CM | POA: Diagnosis not present

## 2017-06-04 DIAGNOSIS — M159 Polyosteoarthritis, unspecified: Secondary | ICD-10-CM

## 2017-06-04 DIAGNOSIS — E871 Hypo-osmolality and hyponatremia: Secondary | ICD-10-CM

## 2017-06-04 DIAGNOSIS — F418 Other specified anxiety disorders: Secondary | ICD-10-CM

## 2017-06-04 DIAGNOSIS — R531 Weakness: Secondary | ICD-10-CM

## 2017-06-04 DIAGNOSIS — J309 Allergic rhinitis, unspecified: Secondary | ICD-10-CM

## 2017-06-04 DIAGNOSIS — M150 Primary generalized (osteo)arthritis: Secondary | ICD-10-CM

## 2017-06-04 DIAGNOSIS — D509 Iron deficiency anemia, unspecified: Secondary | ICD-10-CM | POA: Diagnosis not present

## 2017-06-04 DIAGNOSIS — M8949 Other hypertrophic osteoarthropathy, multiple sites: Secondary | ICD-10-CM

## 2017-06-04 DIAGNOSIS — I10 Essential (primary) hypertension: Secondary | ICD-10-CM | POA: Diagnosis not present

## 2017-06-04 DIAGNOSIS — R69 Illness, unspecified: Secondary | ICD-10-CM | POA: Diagnosis not present

## 2017-06-04 DIAGNOSIS — D500 Iron deficiency anemia secondary to blood loss (chronic): Secondary | ICD-10-CM | POA: Diagnosis not present

## 2017-06-04 DIAGNOSIS — F411 Generalized anxiety disorder: Secondary | ICD-10-CM

## 2017-06-04 DIAGNOSIS — R630 Anorexia: Secondary | ICD-10-CM | POA: Diagnosis not present

## 2017-06-04 LAB — HEPATIC FUNCTION PANEL
ALT: 14 (ref 7–35)
AST: 15 (ref 13–35)
BILIRUBIN, TOTAL: 0.3

## 2017-06-04 LAB — BASIC METABOLIC PANEL
BUN: 87 — AB (ref 4–21)
Creatinine: 0.9 (ref <=1.1)
Glucose: 74
POTASSIUM: 5.5 — AB (ref 3.4–5.3)
SODIUM: 134 — AB (ref 137–147)

## 2017-06-04 LAB — CBC AND DIFFERENTIAL
HEMATOCRIT: 31 — AB (ref 36–46)
HEMOGLOBIN: 10.6 — AB (ref 12.0–16.0)
Platelets: 290 (ref 150–399)
WBC: 6.7

## 2017-06-04 MED ORDER — DULOXETINE HCL 20 MG PO CPEP: 20.0000 mg | ORAL_CAPSULE | Freq: Every day | ORAL | 1 refills | Status: DC

## 2017-06-04 NOTE — Progress Notes (Signed)
Location:  [facility] Room Number: 61 Place of Service:  SNF (31) Provider:  Carolan Avedisian, Manxie  NP  Blanchie Serve, MD  Patient Care Team: Blanchie Serve, MD as PCP - General (Internal Medicine) Lanelle Bal., MD as Referring Physician (Internal Medicine) Satira Sark, MD as Consulting Physician (Cardiology) Kyung Rudd, MD as Consulting Physician (Radiation Oncology)  Extended Emergency Contact Information Primary Emergency Contact: Neita Garnet of [place] Phone: [phone] Relation: Son Secondary Emergency Contact: Elson,Lance Address: [address]          [place], [place] [address] [name] of [place] Phone: [phone] Mobile Phone: [phone] Relation: Son  Code Status:  Full Code Goals of care: Advanced Directive information Advanced Directives 06/04/2017  Does Patient Have a Medical Advance Directive? Yes  Type of Paramedic of [place];Living will  Does patient want to make changes to medical advance directive? No - Patient declined  Copy of [place] in Chart? Yes  Pre-existing out of facility DNR order (yellow form or pink MOST form) -     Chief Complaint  Patient presents with  . Discharge Note    return to apartment    HPI:  Pt is a 83 y.o. female seen today for discharge from SNF [facility] to [place] where she resides.     The patient was admitted to SNF [facility] from [facility] 04/03/17 for worsened hyponatremia(Na 126 04/02/17) following diarrhea of 2-3 weeks. Her diarrhea was resolved upon my initial visit, but she remained weak and aches all over her body. She was treated with IVF D5 NS and encouraged oral intake. Her serum sodium was improved to 134 06/04/17. Her baseline Na 130s(Na 132 06/21/15). She was also identified recurrent depression, her mood was stabilized on Mirtazapine and Cymbalta.     Hx of  NTN, Blood pressure is controlled, on Atenolol 12.98m daily.Takes  Vit B12 10079m daily, Hgb 10.6 06/04/17  Past Medical History:  Diagnosis Date  . Allergic rhinitis   . Anemia    NOS  . Anxiety   . Biceps tendon rupture    Bilateral  . Breast cancer (HCM[facility]2001   Left s/p lumpectomy and XRT   . Bronchiectasis (HCCamp[place]2014   Noted on chest x-ray  . Cough 2014  . Depression   . Diverticulosis   . Elevated liver enzymes    Biopsy consistent with steatohepatitis  . Fundic gland polyps of stomach, benign   . GERD (gastroesophageal reflux disease) 06/2007   EGD Dr RoGala Romneysm [facility], multiple fundic gland polyps  . Glaucoma 2013   both eyes  . Hemorrhoids   . Hiatal hernia   . Hx of radiation therapy 07/17/10 to 07/21/10   SRS LLL lung  . Hyperlipidemia   . Insomnia   . Low back pain    scoliosis  . Lung cancer (HCSpen[place]6/2011   Left 2011 - rad x 3  . Lymphedema    Left arm  . Osteoarthritis   . Osteopenia 10/25/2016  . Osteoporosis, senile   . Overactive bladder   . Pneumonia    RLL with sepsis  . PSVT (paroxysmal supraventricular tachycardia) (HCC[place]  . Rheumatic fever    Age 83. Right thyroid nodule   . Steatohepatitis    liver bx  . Type 2 diabetes mellitus (HCKam[place]   Past Surgical History:  Procedure Laterality Date  . ABDOMINAL HYSTERECTOMY  1964  . APPENDECTOMY  [place] THYROID  11/2008  . BREAST BIOPSY     X 3 w/ cystectomy  . BREAST LUMPECTOMY  2001  . CATARACT EXTRACTION Bilateral 2003   Lens implant Dr. Charise Killian  . CHOLECYSTECTOMY  1965  . COLONOSCOPY  09/11/2011   Procedure: COLONOSCOPY;  Surgeon: Dorothyann Peng, MD;  [facility];  Service: Endoscopy;  Laterality: N/A;  . COLONOSCOPY  2005 /2012   Dr. Lucianne Muss- diverticulosis, ext hemorrhoids.  . CYSTOSCOPY  2007  . ESOPHAGOGASTRODUODENOSCOPY  09/11/2011   Procedure: ESOPHAGOGASTRODUODENOSCOPY (EGD);  Surgeon: Dorothyann Peng, MD;  [facility];  Service: Endoscopy;  Laterality: N/A;  . LIVER BIOPSY  2008  . LUNG BIOPSY Left 06/06/2010  . REVISION TOTAL  HIP ARTHROPLASTY  2007   Left  . SKIN CANCER EXCISION     nose and left lower cheek  . TONSILLECTOMY  1930  . TOTAL HIP ARTHROPLASTY Bilateral 2005 & 2007    Dr. Earl Lagos. Aline Brochure     Allergies  Allergen Reactions  . Ibuprofen Hives  . Naproxen   . Statins Other (See Comments)    Feels like knives in stomach  . Surgilube [Gyne-Moistrin]     Rectal itching, burning  . Tape Rash    Outpatient Encounter Prescriptions as of 06/04/2017  Medication Sig  . acetaminophen (TYLENOL) 325 MG tablet Take 650 mg by mouth. Take 2 tablets every 6 hours as needed  . aspirin 81 MG EC tablet Take 81 mg by mouth daily.    Marland Kitchen atenolol (TENORMIN) 12.5 mg TABS tablet Take 12.5 mg by mouth daily.  . brimonidine (ALPHAGAN) 0.2 % ophthalmic solution Place 1 drop into both eyes 3 (three) times daily.  . cetirizine (ZYRTEC) 10 MG tablet Take 10 mg by mouth daily.  Marland Kitchen Dextromethorphan-Guaifenesin (ROBAFEN DM) 10-100 MG/5ML liquid Take 5 mLs by mouth. 10 ml by mouth every 6 hours as need for cough in 48 hours  . dorzolamide-timolol (COSOPT) 22.3-6.8 MG/ML ophthalmic solution 1 drop. One drop in each eye twice daily  . DULoxetine (CYMBALTA) 20 MG capsule Take 1 capsule (20 mg total) by mouth daily.  Marland Kitchen lactose free nutrition (BOOST) LIQD Take 237 mLs by mouth. Serve in cup over ice twice a day between meals  . magnesium hydroxide (MILK OF MAGNESIA) 400 MG/5ML suspension Take by mouth. 30 ml once a daily as needed for constipation  . mirtazapine (REMERON) 7.5 MG tablet Take 7.5 mg by mouth. Take one tablet at bedtime  . Multiple Vitamin (MULTIVITAMIN) capsule Take 1 capsule by mouth daily.   . Multiple Vitamins-Minerals (PRESERVISION AREDS 2 PO) Take by mouth. Take one capsule twice a day for vision  . omeprazole (PRILOSEC) 20 MG capsule Take 20 mg by mouth. Take one tablet daily  . vitamin B-12 (CYANOCOBALAMIN) 1000 MCG tablet Take 1,000 mcg by mouth daily.  . [DISCONTINUED] DULoxetine (CYMBALTA) 20 MG capsule  Take 20 mg by mouth daily.   No facility-administered encounter medications on file as of 06/04/2017.     Review of Systems  Constitutional: Negative for activity change, appetite change, chills, diaphoresis, fatigue, fever and unexpected weight change.  HENT: Negative for congestion, ear discharge, ear pain, hearing loss, postnasal drip, rhinorrhea, sore throat, tinnitus, trouble swallowing and voice change.   Eyes: Negative.  Negative for pain, discharge, redness, itching and visual disturbance.  Respiratory: Positive for cough. Negative for choking, shortness of breath and wheezing.        Chronic hacking cough  Cardiovascular: Negative for chest pain, palpitations and leg swelling.  Gastrointestinal: Negative for abdominal distention, abdominal pain, constipation, diarrhea and nausea.  Endocrine: Negative for cold intolerance, heat intolerance, polydipsia, polyphagia and polyuria.  Genitourinary: Negative for difficulty urinating, dysuria, flank pain, frequency, hematuria, pelvic pain, urgency and vaginal discharge.  Musculoskeletal: Positive for arthralgias and gait problem. Negative for back pain, myalgias, neck pain and neck stiffness.       Left knee and foot pain,  pain worsens with walking.   Skin: Negative for color change, pallor and rash.  Allergic/Immunologic: Negative.   Neurological: Negative for dizziness, tremors, seizures, syncope, weakness, numbness and headaches.  Hematological: Negative for adenopathy. Does not bruise/bleed easily.  Psychiatric/Behavioral: Negative for agitation, behavioral problems, confusion, dysphoric mood, hallucinations, sleep disturbance and suicidal ideas. The patient is not nervous/anxious and is not hyperactive.     Immunization History  Administered Date(s) Administered  . Influenza Whole 09/22/2008, 09/16/2012, 09/30/2013  . Influenza-Unspecified 10/02/2014, 09/15/2015, 09/27/2016  . Pneumococcal Polysaccharide-23 08/17/2006  . Td  12/17/2005   Pertinent  Health Maintenance Due  Topic Date Due  . FOOT EXAM  07/19/1931  . OPHTHALMOLOGY EXAM  07/19/1931  . PNA vac Low Risk Adult (2 of 2 - PCV13) 08/18/2007  . URINE MICROALBUMIN  04/30/2008  . INFLUENZA VACCINE  07/17/2017  . HEMOGLOBIN A1C  10/17/2017  . DEXA SCAN  Completed   Fall Risk  05/17/2016 04/20/2016 04/19/2016  Falls in the past year? No No No   Functional Status Survey:    Vitals:   06/04/17 1147  BP: 110/66  Pulse: 66  Resp: 20  Temp: 98.8 F (37.1 C)  Weight: 155 lb 11.2 oz (70.6 kg)  Height: 5' 5"  (1.651 m)   Body mass index is 25.91 kg/m. Physical Exam  Constitutional: She is oriented to person, place, and time. She appears well-developed and well-nourished. No distress.  HENT:  Head: Normocephalic and atraumatic.  Mouth/Throat: Oropharynx is clear and moist. No oropharyngeal exudate.  White coated tongue  Eyes: Conjunctivae are normal. Pupils are equal, round, and reactive to light.  Neck: Normal range of motion. Neck supple.  Cardiovascular: Normal rate, regular rhythm and normal heart sounds.   Occasional PACs  Pulmonary/Chest: Effort normal and breath sounds normal. No respiratory distress. She has no wheezes. She has no rales.  Abdominal: Soft. Bowel sounds are normal.  Genitourinary: Rectal exam shows guaiac negative stool.  Musculoskeletal: Normal range of motion. She exhibits tenderness. She exhibits no edema.  Left knee and foot pain, not new, no apparent injury, bruise, redness, or swelling.   Neurological: She is alert and oriented to person, place, and time.  Skin: Skin is warm and dry. She is not diaphoretic.  Left parietal area skin cancer removal scar   Psychiatric: She has a normal mood and affect.    Labs reviewed:  Recent Labs  11/23/16 0730  05/14/17 05/21/17 06/04/17  NA 135  < > 132* 132* 134*  K 4.3  < > 4.9 4.9 5.5*  CL 98  --   --   --   --   CO2 29  --   --   --   --   GLUCOSE 74  --   --   --   --     BUN 15  < > 27* 28* 87*  CREATININE 1.02*  < > 1.0 0.9 0.9  CALCIUM 9.7  --   --   --   --   < > =  values in this interval not displayed.  Recent Labs  05/14/17 05/21/17 05/28/17 06/04/17  AST 21 14  --  15  ALT 31 18  --  14  ALKPHOS 111 123 90  --     Recent Labs  05/14/17 05/21/17 06/04/17  WBC 8.1 8.4 6.7  HGB 9.8* 10.3* 10.6*  HCT 30* 30* 31*  PLT 401* 411* 290   Lab Results  Component Value Date   TSH 33.00 (A) 04/16/2017   Lab Results  Component Value Date   HGBA1C 5.3 04/16/2017   Lab Results  Component Value Date   CHOL 188 04/12/2016   HDL 62 04/12/2016   LDLCALC 102 04/12/2016   TRIG 122 04/12/2016   CHOLHDL 3.0 Ratio 06/07/2008    Significant Diagnostic Results in last 30 days:  No results found.  Assessment/Plan Hyponatremia 05/21/17 Na 132, K 4.9, Bun 28, creat 0.86, wbc 8.4, Hgb 10.3, plt 411 06/04/17 Na 134, K 5.5, Bun 27, creat 0.88, wbc 6.7, Hgb 10.6, plt 290 worsened hyponatremia(Na 126 04/02/17) following diarrhea of 2-3 weeks. Her diarrhea was resolved upon my initial visit, but she remained weak and aches all over her body. She was treated with IVF D5 NS and encouraged oral intake. Her serum sodium was improved to 134 06/04/17. Her baseline Na 130s(Na 132 06/21/15) Will discharge [place] [facility] with close monitoring BMP and  f/u in clinic [place] monthly.   Depression with anxiety Stabilizing, continue Mirtazapine 7.4m qhs, Cymbalta 266mdaily.   Anemia Hgb 10.6 06/04/17, continue Vit B12, continue to be off Fe, Folic acid, monitor CBC monthly.   HTN (hypertension) Controlled, continue Atenolol 12.23m30md  Allergic rhinitis Stable, continue Zyrtec  GERD  Stable, continue Omeprazole 65m11mily.   Osteoarthritis Multiple sites, well managed with Tylenol 650mg33m.   Generalized weakness Improved   Decrease in appetite Normalized.      Family/ staff Communication: discharge home [place] [facility]  Labs/tests ordered:  BMP and CBC monthly [facility]

## 2017-06-04 NOTE — Assessment & Plan Note (Addendum)
05/21/17 Na 132, K 4.9, Bun 28, creat 0.86, wbc 8.4, Hgb 10.3, plt 411 06/04/17 Na 134, K 5.5, Bun 27, creat 0.88, wbc 6.7, Hgb 10.6, plt 290 worsened hyponatremia(Na 126 04/02/17) following diarrhea of 2-3 weeks. Her diarrhea was resolved upon my initial visit, but she remained weak and aches all over her body. She was treated with IVF D5 NS and encouraged oral intake. Her serum sodium was improved to 134 06/04/17. Her baseline Na 130s(Na 132 06/21/15) Will discharge [place] [facility] with close monitoring BMP and  f/u in clinic [place] monthly.

## 2017-06-05 NOTE — Assessment & Plan Note (Signed)
Controlled, continue Atenolol 12.67m qd

## 2017-06-05 NOTE — Assessment & Plan Note (Signed)
Stable, continue Zyrtec

## 2017-06-05 NOTE — Assessment & Plan Note (Signed)
Multiple sites, well managed with Tylenol 646m bid.

## 2017-06-05 NOTE — Assessment & Plan Note (Signed)
Stabilizing, continue Mirtazapine 7.27m qhs, Cymbalta 283mdaily.

## 2017-06-05 NOTE — Assessment & Plan Note (Signed)
Normalized

## 2017-06-05 NOTE — Assessment & Plan Note (Signed)
Improved

## 2017-06-05 NOTE — Assessment & Plan Note (Signed)
Stable, continue Omeprazole 52m daily.

## 2017-06-05 NOTE — Assessment & Plan Note (Signed)
Hgb 10.6 06/04/17, continue Vit B12, continue to be off Fe, Folic acid, monitor CBC monthly.

## 2017-06-09 DIAGNOSIS — R1032 Left lower quadrant pain: Secondary | ICD-10-CM | POA: Diagnosis not present

## 2017-06-09 DIAGNOSIS — K5900 Constipation, unspecified: Secondary | ICD-10-CM | POA: Diagnosis not present

## 2017-06-17 ENCOUNTER — Other Ambulatory Visit: Payer: Self-pay | Admitting: *Deleted

## 2017-06-17 MED ORDER — MIRTAZAPINE 7.5 MG PO TABS: 7.5000 mg | ORAL_TABLET | Freq: Every day | ORAL | 1 refills | Status: DC

## 2017-07-01 DIAGNOSIS — I10 Essential (primary) hypertension: Secondary | ICD-10-CM | POA: Diagnosis not present

## 2017-07-01 DIAGNOSIS — R69 Illness, unspecified: Secondary | ICD-10-CM | POA: Diagnosis not present

## 2017-07-02 LAB — CBC
HCT: 32.7 % — ABNORMAL LOW (ref 35.0–45.0)
Hemoglobin: 11.1 g/dL — ABNORMAL LOW (ref 11.7–15.5)
MCH: 30.3 pg (ref 27.0–33.0)
MCHC: 33.9 g/dL (ref 32.0–36.0)
MCV: 89.3 fL (ref 80.0–100.0)
MPV: 11.8 fL (ref 7.5–12.5)
Platelets: 263 10*3/uL (ref 140–400)
RBC: 3.66 MIL/uL — ABNORMAL LOW (ref 3.80–5.10)
RDW: 14.7 % (ref 11.0–15.0)
WBC: 5.4 10*3/uL (ref 3.8–10.8)

## 2017-07-02 LAB — BASIC METABOLIC PANEL
BUN: 17 mg/dL (ref 7–25)
CALCIUM: 9.5 mg/dL (ref 8.6–10.4)
CO2: 28 mmol/L (ref 20–31)
CREATININE: 0.86 mg/dL (ref 0.60–0.88)
Chloride: 100 mmol/L (ref 98–110)
GLUCOSE: 80 mg/dL (ref 65–99)
Potassium: 3.9 mmol/L (ref 3.5–5.3)
Sodium: 135 mmol/L (ref 135–146)

## 2017-07-04 ENCOUNTER — Encounter: Payer: Self-pay | Admitting: Nurse Practitioner

## 2017-07-04 ENCOUNTER — Non-Acute Institutional Stay: Payer: Medicare HMO | Admitting: Nurse Practitioner

## 2017-07-04 DIAGNOSIS — I471 Supraventricular tachycardia: Secondary | ICD-10-CM

## 2017-07-04 DIAGNOSIS — I10 Essential (primary) hypertension: Secondary | ICD-10-CM | POA: Diagnosis not present

## 2017-07-04 DIAGNOSIS — F418 Other specified anxiety disorders: Secondary | ICD-10-CM

## 2017-07-04 DIAGNOSIS — J309 Allergic rhinitis, unspecified: Secondary | ICD-10-CM

## 2017-07-04 DIAGNOSIS — D500 Iron deficiency anemia secondary to blood loss (chronic): Secondary | ICD-10-CM

## 2017-07-04 DIAGNOSIS — K5900 Constipation, unspecified: Secondary | ICD-10-CM

## 2017-07-04 DIAGNOSIS — E871 Hypo-osmolality and hyponatremia: Secondary | ICD-10-CM | POA: Diagnosis not present

## 2017-07-04 DIAGNOSIS — R69 Illness, unspecified: Secondary | ICD-10-CM | POA: Diagnosis not present

## 2017-07-04 NOTE — Assessment & Plan Note (Signed)
Last Hgb 11.1 07/01/17, update CBC in one month

## 2017-07-04 NOTE — Patient Instructions (Addendum)
CBC CMP prior to the next appointment, f/u appointment 1:30 pm 08/01/17 with [facility] Neesha Langton NP

## 2017-07-04 NOTE — Assessment & Plan Note (Signed)
Controlled, continue Atenolol 12.94m qd

## 2017-07-04 NOTE — Assessment & Plan Note (Signed)
Heart rate is in control, continue  Atenolol 12.39m qd, 04/15/17 EKG SR PAC

## 2017-07-04 NOTE — Assessment & Plan Note (Signed)
went to urgent care last Sunday, resolved after 2 tabs of Senokot, she manages her constipation with diet expecially prune juice.

## 2017-07-04 NOTE — Progress Notes (Signed)
Location:  [place] of Service:  Clinic (12) Provider:  Elycia Woodside, Manxie  NP  Blanchie Serve, MD  Patient Care Team: Blanchie Serve, MD as PCP - General (Internal Medicine) Lanelle Bal., MD as Referring Physician (Internal Medicine) Satira Sark, MD as Consulting Physician (Cardiology) Kyung Rudd, MD as Consulting Physician (Radiation Oncology)  Extended Emergency Contact Information Primary Emergency Contact: Neita Garnet of [place] Phone: [phone] Relation: Son Secondary Emergency Contact: Forni,Lance Address: [address]          [place], [place] [address] [name] of [place] Phone: [phone] Mobile Phone: [phone] Relation: Son  Code Status:  Full Code Goals of care: Advanced Directive information Advanced Directives 07/04/2017  Does Patient Have a Medical Advance Directive? Yes  Type of Paramedic of [place];Living will  Does patient want to make changes to medical advance directive? No - Patient declined  Copy of [place] in Chart? Yes  Pre-existing out of facility DNR order (yellow form or pink MOST form) -     Chief Complaint  Patient presents with  . Medical Management of Chronic Issues    f/u -4 weeks w/ lab review, needs prevnar 13    HPI:  Pt is a 83 y.o. female seen today for the patient is in clinic for f/u hyponatremia, last Na 135 07/01/17. F/u depression, she said she sleeps too much, sometimes has dull headache, but not severe enough to take Tylenol, saw Ophthalmology 04/29/17 for glaucoma, her intraocular pressure is normal per patient.  She eats well, taking Cymbalta 1m, Mirtazapine 7.514mqd, her mood is stable. Chronic constipation, went to urgent care last Sunday, resolved after 2 tabs of Senokot, she manages her constipation with diet expecially prune juice.   Hx of  NTN, Blood pressure is controlled, hx of skip heartbeats, on Atenolol  12.42m6maily.Takes Vit B12 1000m27maily, Hgb 11.1 07/01/17  Past Medical History:  Diagnosis Date  . Allergic rhinitis   . Anemia    NOS  . Anxiety   . Biceps tendon rupture    Bilateral  . Breast cancer (HCC)[place]01   Left s/p lumpectomy and XRT   . Bronchiectasis (HCC)[place]14   Noted on chest x-ray  . Cough 2014  . Depression   . Diverticulosis   . Elevated liver enzymes    Biopsy consistent with steatohepatitis  . Fundic gland polyps of stomach, benign   . GERD (gastroesophageal reflux disease) 06/2007   [facility]GD Dr [facility], multiple fundic gland polyps  . Glaucoma 2013   both eyes  . Hemorrhoids   . Hiatal hernia   . Hx of radiation therapy 07/17/10 to 07/21/10   SRS LLL lung  . Hyperlipidemia   . Insomnia   . Low back pain    scoliosis  . Lung cancer (HCC)[place]2011   Left 2011 - rad x 3  . Lymphedema    Left arm  . Osteoarthritis   . Osteopenia 10/25/2016  . Osteoporosis, senile   . Overactive bladder   . Pneumonia    RLL with sepsis  . PSVT (paroxysmal supraventricular tachycardia) (HCC)[place]. Rheumatic fever    Age 83  37Right thyroid nodule   . Steatohepatitis    liver bx  . Type 2 diabetes mellitus (HCC)[place] Past Surgical History:  Procedure Laterality Date  . ABDOMINAL HYSTERECTOMY  1964  . APPENDECTOMY  1964  .  BIOPSY THYROID  11/2008  . BREAST BIOPSY     X 3 w/ cystectomy  . BREAST LUMPECTOMY  2001  . CATARACT EXTRACTION Bilateral 2003   Lens implant Dr. Charise Killian  . CHOLECYSTECTOMY  1965  . COLONOSCOPY  09/11/2011   Procedure: COLONOSCOPY;  Surgeon: Dorothyann Peng, MD;  [facility];  Service: Endoscopy;  Laterality: N/A;  . COLONOSCOPY  2005 /2012   Dr. Lucianne Muss- diverticulosis, ext hemorrhoids.  . CYSTOSCOPY  2007  . ESOPHAGOGASTRODUODENOSCOPY  09/11/2011   Procedure: ESOPHAGOGASTRODUODENOSCOPY (EGD);  Surgeon: Dorothyann Peng, MD;  [facility];  Service: Endoscopy;  Laterality: N/A;  . LIVER BIOPSY  2008  . LUNG BIOPSY Left 06/06/2010    . REVISION TOTAL HIP ARTHROPLASTY  2007   Left  . SKIN CANCER EXCISION     nose and left lower cheek  . TONSILLECTOMY  1930  . TOTAL HIP ARTHROPLASTY Bilateral 2005 & 2007    Dr. Earl Lagos. Aline Brochure     Allergies  Allergen Reactions  . Ibuprofen Hives  . Naproxen   . Statins Other (See Comments)    Feels like knives in stomach  . Surgilube [Gyne-Moistrin]     Rectal itching, burning  . Tape Rash    Outpatient Encounter Prescriptions as of 07/04/2017  Medication Sig  . acetaminophen (TYLENOL) 325 MG tablet Take 650 mg by mouth. Take 2 tablets every 6 hours as needed  . aspirin 81 MG EC tablet Take 81 mg by mouth daily.    Marland Kitchen atenolol (TENORMIN) 12.5 mg TABS tablet Take 12.5 mg by mouth daily.  . brimonidine (ALPHAGAN) 0.2 % ophthalmic solution Place 1 drop into both eyes 3 (three) times daily.  . cetirizine (ZYRTEC) 10 MG tablet Take 10 mg by mouth daily.  Marland Kitchen Dextromethorphan-Guaifenesin (ROBAFEN DM) 10-100 MG/5ML liquid Take 5 mLs by mouth. 10 ml by mouth every 6 hours as need for cough in 48 hours  . dorzolamide-timolol (COSOPT) 22.3-6.8 MG/ML ophthalmic solution 1 drop. One drop in each eye twice daily  . DULoxetine (CYMBALTA) 20 MG capsule Take 1 capsule (20 mg total) by mouth daily.  Marland Kitchen lactose free nutrition (BOOST) LIQD Take 237 mLs by mouth. Serve in cup over ice once a day between meals  . magnesium hydroxide (MILK OF MAGNESIA) 400 MG/5ML suspension Take by mouth. 30 ml once a daily as needed for constipation  . mirtazapine (REMERON) 7.5 MG tablet Take 1 tablet (7.5 mg total) by mouth at bedtime.  . Multiple Vitamin (MULTIVITAMIN) capsule Take 1 capsule by mouth daily.   . Multiple Vitamins-Minerals (PRESERVISION AREDS 2 PO) Take by mouth. Take one capsule twice a day for vision  . omeprazole (PRILOSEC) 20 MG capsule Take 20 mg by mouth. Take one tablet daily  . vitamin B-12 (CYANOCOBALAMIN) 1000 MCG tablet Take 1,000 mcg by mouth daily.   No facility-administered  encounter medications on file as of 07/04/2017.     Review of Systems  Constitutional: Negative for appetite change, fatigue and fever.  HENT: Negative for congestion, ear pain, hearing loss, postnasal drip, rhinorrhea, sore throat, trouble swallowing and voice change.   Eyes: Negative.  Negative for pain, redness and visual disturbance.  Respiratory: Positive for cough. Negative for choking and shortness of breath.        Chronic hacking cough  Cardiovascular: Negative for chest pain, palpitations and leg swelling.  Gastrointestinal: Negative for abdominal distention, abdominal pain, constipation, diarrhea and nausea.  Endocrine: Negative for polyuria.  Genitourinary: Negative  for difficulty urinating, dysuria, frequency and urgency.  Musculoskeletal: Positive for arthralgias and gait problem. Negative for back pain and myalgias.       Left knee and foot pain,  pain worsens with walking. Power scooter for mobility  Skin: Negative for rash.  Allergic/Immunologic: Negative.   Neurological: Positive for headaches. Negative for dizziness, weakness and numbness.  Hematological: Does not bruise/bleed easily.  Psychiatric/Behavioral: Negative for agitation, behavioral problems and confusion.    Immunization History  Administered Date(s) Administered  . Influenza Whole 09/22/2008, 09/16/2012, 09/30/2013  . Influenza-Unspecified 10/02/2014, 09/15/2015, 09/27/2016  . Pneumococcal Polysaccharide-23 08/17/2006  . Td 12/17/2005   Pertinent  Health Maintenance Due  Topic Date Due  . FOOT EXAM  07/19/1931  . OPHTHALMOLOGY EXAM  07/19/1931  . PNA vac Low Risk Adult (2 of 2 - PCV13) 08/18/2007  . URINE MICROALBUMIN  04/30/2008  . INFLUENZA VACCINE  07/17/2017  . HEMOGLOBIN A1C  10/17/2017  . DEXA SCAN  Completed   Fall Risk  05/17/2016 04/20/2016 04/19/2016  Falls in the past year? No No No   Functional Status Survey:    Vitals:   07/04/17 1334  BP: 120/80  Pulse: 70  Resp: 20  Temp: 97.7  F (36.5 C)  SpO2: 96%  Weight: 158 lb (71.7 kg)  Height: 5' 5"  (1.651 m)   Body mass index is 26.29 kg/m. Physical Exam  Constitutional: She is oriented to person, place, and time. She appears well-developed and well-nourished. No distress.  HENT:  Head: Normocephalic and atraumatic.  Eyes: Pupils are equal, round, and reactive to light. Conjunctivae are normal.  Neck: Normal range of motion. Neck supple.  Cardiovascular: Normal rate, regular rhythm and normal heart sounds.   Occasional PACs  Pulmonary/Chest: Effort normal and breath sounds normal. She has no wheezes. She has no rales.  Abdominal: Soft. Bowel sounds are normal.  Musculoskeletal: Normal range of motion. She exhibits tenderness. She exhibits no edema.  Left knee and foot pain, not new, no apparent injury, bruise, redness, or swelling.   Neurological: She is alert and oriented to person, place, and time.  Skin: Skin is warm and dry. She is not diaphoretic.  Left parietal area skin cancer removal scar   Psychiatric: She has a normal mood and affect.    Labs reviewed:  Recent Labs  11/23/16 0730  05/21/17 06/04/17 07/01/17 0808  NA 135  < > 132* 134* 135  K 4.3  < > 4.9 5.5* 3.9  CL 98  --   --   --  100  CO2 29  --   --   --  28  GLUCOSE 74  --   --   --  80  BUN 15  < > 28* 87* 17  CREATININE 1.02*  < > 0.9 0.9 0.86  CALCIUM 9.7  --   --   --  9.5  < > = values in this interval not displayed.  Recent Labs  05/14/17 05/21/17 05/28/17 06/04/17  AST 21 14  --  15  ALT 31 18  --  14  ALKPHOS 111 123 90  --     Recent Labs  05/21/17 06/04/17 07/01/17 0808  WBC 8.4 6.7 5.4  HGB 10.3* 10.6* 11.1*  HCT 30* 31* 32.7*  MCV  --   --  89.3  PLT 411* 290 263   Lab Results  Component Value Date   TSH 33.00 (A) 04/16/2017   Lab Results  Component Value Date   HGBA1C  5.3 04/16/2017   Lab Results  Component Value Date   CHOL 188 04/12/2016   HDL 62 04/12/2016   LDLCALC 102 04/12/2016   TRIG 122  04/12/2016   CHOLHDL 3.0 Ratio 06/07/2008    Significant Diagnostic Results in last 30 days:  No results found.  Assessment/Plan Hyponatremia 07/01/17 Na 135, K 3.9, Bun 17, creat 0.86, wbc 5.4, Hgb 11.1, plt 263, update CMP one month  Depression with anxiety Her mood is stable, she said she sleeps too much, sometimes has dull headache, but not severe enough to take Tylenol, saw Ophthalmology 04/29/17 for glaucoma, her intraocular pressure is normal per patient.  She eats well. Trial of dc Mirtazapine 7.23m qhs, continue Cymbalta 211mdaily. Observe the patient.   Constipation went to urgent care last Sunday, resolved after 2 tabs of Senokot, she manages her constipation with diet expecially prune juice.   HTN (hypertension) Controlled, continue Atenolol 12.64m6md  PSVT (paroxysmal supraventricular tachycardia) (HCC) Heart rate is in control, continue  Atenolol 12.64mg564m, 04/15/17 EKG SR PAC  Anemia Last Hgb 11.1 07/01/17, update CBC in one month  Allergic rhinitis Zyrtec works for her.      Family/ staff Communication: discharge home [place] [facility]  Labs/tests ordered: CBC CMP prior to the next appointment  Next appointment: 1:30 pm 08/01/17

## 2017-07-04 NOTE — Assessment & Plan Note (Signed)
07/01/17 Na 135, K 3.9, Bun 17, creat 0.86, wbc 5.4, Hgb 11.1, plt 263, update CMP one month

## 2017-07-04 NOTE — Assessment & Plan Note (Signed)
Her mood is stable, she said she sleeps too much, sometimes has dull headache, but not severe enough to take Tylenol, saw Ophthalmology 04/29/17 for glaucoma, her intraocular pressure is normal per patient.  She eats well. Trial of dc Mirtazapine 7.48m qhs, continue Cymbalta 264mdaily. Observe the patient.

## 2017-07-04 NOTE — Assessment & Plan Note (Signed)
Zyrtec works for her.

## 2017-07-05 ENCOUNTER — Other Ambulatory Visit: Payer: Self-pay | Admitting: Internal Medicine

## 2017-07-16 ENCOUNTER — Other Ambulatory Visit: Payer: Self-pay | Admitting: Nurse Practitioner

## 2017-07-24 ENCOUNTER — Other Ambulatory Visit: Payer: Self-pay | Admitting: *Deleted

## 2017-07-24 ENCOUNTER — Other Ambulatory Visit: Payer: Self-pay

## 2017-07-24 DIAGNOSIS — D500 Iron deficiency anemia secondary to blood loss (chronic): Secondary | ICD-10-CM

## 2017-07-24 DIAGNOSIS — Z85828 Personal history of other malignant neoplasm of skin: Secondary | ICD-10-CM | POA: Diagnosis not present

## 2017-07-24 DIAGNOSIS — L218 Other seborrheic dermatitis: Secondary | ICD-10-CM | POA: Diagnosis not present

## 2017-07-24 DIAGNOSIS — L570 Actinic keratosis: Secondary | ICD-10-CM | POA: Diagnosis not present

## 2017-07-24 DIAGNOSIS — E871 Hypo-osmolality and hyponatremia: Secondary | ICD-10-CM

## 2017-07-24 DIAGNOSIS — D619 Aplastic anemia, unspecified: Secondary | ICD-10-CM | POA: Diagnosis not present

## 2017-07-25 LAB — COMPREHENSIVE METABOLIC PANEL
ALK PHOS: 90 U/L (ref 33–130)
ALT: 11 U/L (ref 6–29)
AST: 17 U/L (ref 10–35)
Albumin: 3.6 g/dL (ref 3.6–5.1)
BILIRUBIN TOTAL: 0.4 mg/dL (ref 0.2–1.2)
BUN: 19 mg/dL (ref 7–25)
CALCIUM: 9.7 mg/dL (ref 8.6–10.4)
CO2: 27 mmol/L (ref 20–32)
Chloride: 99 mmol/L (ref 98–110)
Creat: 0.83 mg/dL (ref 0.60–0.88)
Glucose, Bld: 85 mg/dL (ref 65–99)
Potassium: 4.3 mmol/L (ref 3.5–5.3)
Sodium: 135 mmol/L (ref 135–146)
Total Protein: 7 g/dL (ref 6.1–8.1)

## 2017-07-25 LAB — HEPATIC FUNCTION PANEL
ALT: 11 U/L (ref 6–29)
AST: 17 U/L (ref 10–35)
Albumin: 3.6 g/dL (ref 3.6–5.1)
Alkaline Phosphatase: 90 U/L (ref 33–130)
BILIRUBIN INDIRECT: 0.3 mg/dL (ref 0.2–1.2)
Bilirubin, Direct: 0.1 mg/dL (ref <=0.2)
Total Bilirubin: 0.4 mg/dL (ref 0.2–1.2)
Total Protein: 7 g/dL (ref 6.1–8.1)

## 2017-07-25 LAB — CBC
HEMATOCRIT: 38.3 % (ref 35.0–45.0)
HEMOGLOBIN: 12.4 g/dL (ref 11.7–15.5)
MCH: 31.2 pg (ref 27.0–33.0)
MCHC: 32.4 g/dL (ref 32.0–36.0)
MCV: 96.2 fL (ref 80.0–100.0)
MPV: 12.4 fL (ref 7.5–12.5)
Platelets: 272 10*3/uL (ref 140–400)
RBC: 3.98 MIL/uL (ref 3.80–5.10)
RDW: 14.8 % (ref 11.0–15.0)
WBC: 6.9 10*3/uL (ref 3.8–10.8)

## 2017-07-31 ENCOUNTER — Other Ambulatory Visit: Payer: Self-pay | Admitting: *Deleted

## 2017-07-31 DIAGNOSIS — D508 Other iron deficiency anemias: Secondary | ICD-10-CM

## 2017-08-01 ENCOUNTER — Non-Acute Institutional Stay: Payer: Medicare HMO | Admitting: Nurse Practitioner

## 2017-08-01 ENCOUNTER — Encounter: Payer: Self-pay | Admitting: Nurse Practitioner

## 2017-08-01 DIAGNOSIS — F418 Other specified anxiety disorders: Secondary | ICD-10-CM

## 2017-08-01 DIAGNOSIS — R69 Illness, unspecified: Secondary | ICD-10-CM | POA: Diagnosis not present

## 2017-08-01 DIAGNOSIS — E871 Hypo-osmolality and hyponatremia: Secondary | ICD-10-CM

## 2017-08-01 DIAGNOSIS — D510 Vitamin B12 deficiency anemia due to intrinsic factor deficiency: Secondary | ICD-10-CM | POA: Diagnosis not present

## 2017-08-01 DIAGNOSIS — I10 Essential (primary) hypertension: Secondary | ICD-10-CM

## 2017-08-01 NOTE — Assessment & Plan Note (Signed)
Last Hgb 12.4 07/24/17, continue Vit B12, f/u CBC one month

## 2017-08-01 NOTE — Assessment & Plan Note (Signed)
serum Na  135 07/24/17, f/u BMP in one month

## 2017-08-01 NOTE — Assessment & Plan Note (Signed)
she sleeps and eats well, taking Cymbalta 61m, off  Mirtazapine 7.547mqd since it make her too sleepy

## 2017-08-01 NOTE — Progress Notes (Signed)
Location:  [place] of Service:  Clinic (12) Provider:  Izzabella Besse, Manxie  NP  Blanchie Serve, MD  Patient Care Team: Blanchie Serve, MD as PCP - General (Internal Medicine) Lanelle Bal., MD as Referring Physician (Internal Medicine) Satira Sark, MD as Consulting Physician (Cardiology) Kyung Rudd, MD as Consulting Physician (Radiation Oncology)  Extended Emergency Contact Information Primary Emergency Contact: Neita Garnet of [place] Phone: [phone] Relation: Son Secondary Emergency Contact: Mcgillicuddy,Lance Address: [address]          [place], [place] [address] [name] of [place] Phone: [phone] Mobile Phone: [phone] Relation: Son  Code Status:  Full Code Goals of care: Advanced Directive information Advanced Directives 08/01/2017  Does Patient Have a Medical Advance Directive? Yes  Type of Paramedic of [place];Living will  Does patient want to make changes to medical advance directive? No - Patient declined  Copy of [place] in Chart? Yes  Pre-existing out of facility DNR order (yellow form or pink MOST form) -     Chief Complaint  Patient presents with  . Medical Management of Chronic Issues    1 mo f/u w labs    HPI:  Pt is a 83 y.o. female seen today for the patient is in clinic for f/u hyponatremia, serum Na  135 07/24/17, she sleeps and eats well, taking Cymbalta 10m, off  Mirtazapine 7.5188mqd since it make her too sleepy. Chronic constipation, managed with diet. Hx of  NTN, Blood pressure is controlled, hx of skip heartbeats, on Atenolol 12.88m79maily.last Hgb 12.4 07/24/17 Past Medical History:  Diagnosis Date  . Allergic rhinitis   . Anemia    NOS  . Anxiety   . Biceps tendon rupture    Bilateral  . Breast cancer ([place]CC[place]001   Left s/p lumpectomy and XRT   . Bronchiectasis (HCCLower [place]014   Noted on chest x-ray  . Cough 2014  . Depression   .  Diverticulosis   . Elevated liver enzymes    Biopsy consistent with steatohepatitis  . Fundic gland polyps of stomach, benign   . GERD (gastroesophageal reflux disease) 06/2007   EGD Dr [facility], multiple fundic gland polyps  . Glaucoma 2013   both eyes  . Hemorrhoids   . Hiatal hernia   . Hx of radiation therapy 07/17/10 to 07/21/10   SRS LLL lung  . Hyperlipidemia   . Insomnia   . Low back pain    scoliosis  . Lung cancer ([place]CC[place]/2011   Left 2011 - rad x 3  . Lymphedema    Left arm  . Osteoarthritis   . Osteopenia 10/25/2016  . Osteoporosis, senile   . Overactive bladder   . Pneumonia    RLL with sepsis  . PSVT (paroxysmal supraventricular tachycardia) (HCCV[place] . Rheumatic fever    Age 60 69 Right thyroid nodule   . Steatohepatitis    liver bx  . Type 2 diabetes mellitus (HCCReader  Past Surgical History:  Procedure Laterality Date  . ABDOMINAL HYSTERECTOMY  1964  . APPENDECTOMY  1964  . BIOPSY THYROID  11/2008  . BREAST BIOPSY     X 3 w/ cystectomy  . BREAST LUMPECTOMY  2001  . CATARACT EXTRACTION Bilateral 2003   Lens implant Dr. EpeCharise Killian CHOLECYSTECTOMY  1965  . COLONOSCOPY  09/11/2011   Procedure: COLONOSCOPY;  Surgeon: SanDorothyann PengD;  [facility];  Service: Endoscopy;  Laterality: N/A;  . COLONOSCOPY  2005 /2012   Dr. Lucianne Muss- diverticulosis, ext hemorrhoids.  . CYSTOSCOPY  2007  . ESOPHAGOGASTRODUODENOSCOPY  09/11/2011   Procedure: ESOPHAGOGASTRODUODENOSCOPY (EGD);  Surgeon: Dorothyann Peng, MD;  [facility];  Service: Endoscopy;  Laterality: N/A;  . LIVER BIOPSY  2008  . LUNG BIOPSY Left 06/06/2010  . REVISION TOTAL HIP ARTHROPLASTY  2007   Left  . SKIN CANCER EXCISION     nose and left lower cheek  . TONSILLECTOMY  1930  . TOTAL HIP ARTHROPLASTY Bilateral 2005 & 2007    Dr. Earl Lagos. Aline Brochure     Allergies  Allergen Reactions  . Ibuprofen Hives  . Naproxen   . Statins Other (See Comments)    Feels like knives in stomach    . Surgilube [Gyne-Moistrin]     Rectal itching, burning  . Tape Rash    Outpatient Encounter Prescriptions as of 08/01/2017  Medication Sig  . acetaminophen (TYLENOL) 325 MG tablet Take 650 mg by mouth. Take 2 tablets every 6 hours as needed  . aspirin 81 MG EC tablet Take 81 mg by mouth daily.    Marland Kitchen atenolol (TENORMIN) 12.5 mg TABS tablet Take 12.5 mg by mouth daily.  . brimonidine (ALPHAGAN) 0.2 % ophthalmic solution Place 1 drop into both eyes 3 (three) times daily.  . cetirizine (ZYRTEC) 10 MG tablet Take 10 mg by mouth daily.  . dorzolamide-timolol (COSOPT) 22.3-6.8 MG/ML ophthalmic solution 1 drop. One drop in each eye twice daily  . DULoxetine (CYMBALTA) 20 MG capsule Take 1 capsule (20 mg total) by mouth daily.  Marland Kitchen lactose free nutrition (BOOST) LIQD Take 237 mLs by mouth. Serve in cup over ice once a day between meals  . Multiple Vitamins-Minerals (PRESERVISION AREDS 2 PO) Take by mouth. Take one capsule twice a day for vision  . omeprazole (PRILOSEC) 20 MG capsule TAKE 1 CAPSULE EVERY DAY.  . vitamin B-12 (CYANOCOBALAMIN) 1000 MCG tablet Take 1,000 mcg by mouth daily.  Marland Kitchen Dextromethorphan-Guaifenesin (ROBAFEN DM) 10-100 MG/5ML liquid Take 5 mLs by mouth. 10 ml by mouth every 6 hours as need for cough in 48 hours  . [DISCONTINUED] magnesium hydroxide (MILK OF MAGNESIA) 400 MG/5ML suspension Take by mouth. 30 ml once a daily as needed for constipation  . [DISCONTINUED] mirtazapine (REMERON) 7.5 MG tablet Take 1 tablet (7.5 mg total) by mouth at bedtime.  . [DISCONTINUED] Multiple Vitamin (MULTIVITAMIN) capsule Take 1 capsule by mouth daily.    No facility-administered encounter medications on file as of 08/01/2017.     Review of Systems  Constitutional: Negative for activity change and appetite change.  HENT: Positive for hearing loss.   Respiratory: Negative for cough, choking and shortness of breath.   Cardiovascular: Negative for chest pain, palpitations and leg swelling.   Genitourinary: Positive for frequency. Negative for difficulty urinating and dysuria.       4-5x/night, not troubling returning to sleep  Musculoskeletal: Positive for arthralgias and gait problem. Negative for back pain.       Knee pain when getting up and down, walking with walker for short, electric scooter to go further  Neurological: Negative for dizziness and headaches.  Psychiatric/Behavioral: Negative for behavioral problems and sleep disturbance. The patient is not nervous/anxious.     Immunization History  Administered Date(s) Administered  . Influenza Whole 09/22/2008, 09/16/2012, 09/30/2013  . Influenza-Unspecified 10/02/2014, 09/15/2015, 09/27/2016  . Pneumococcal Polysaccharide-23 08/17/2006  . Td 12/17/2005  Pertinent  Health Maintenance Due  Topic Date Due  . FOOT EXAM  07/19/1931  . OPHTHALMOLOGY EXAM  07/19/1931  . PNA vac Low Risk Adult (2 of 2 - PCV13) 08/18/2007  . URINE MICROALBUMIN  04/30/2008  . INFLUENZA VACCINE  07/17/2017  . HEMOGLOBIN A1C  10/17/2017  . DEXA SCAN  Completed   Fall Risk  05/17/2016 04/20/2016 04/19/2016  Falls in the past year? No No No   Functional Status Survey:    Vitals:   08/01/17 1342  BP: 132/70  Pulse: 76  Resp: 20  Temp: 98.3 F (36.8 C)  SpO2: 96%  Weight: 154 lb 12.8 oz (70.2 kg)  Height: 5' 5"  (1.651 m)   Body mass index is 25.76 kg/m. Physical Exam  Constitutional: She is oriented to person, place, and time. She appears well-developed and well-nourished.  HENT:  Head: Normocephalic and atraumatic.  Cardiovascular: Normal rate, regular rhythm and normal heart sounds.   Occasional PACs  Pulmonary/Chest: Effort normal and breath sounds normal. She has no wheezes. She has no rales.  Musculoskeletal: Normal range of motion. She exhibits no edema or tenderness.  Knee pain when getting up and down, otherwise no pain, ambulates with walker for short distance and electric scooter to go further.   Neurological: She is  alert and oriented to person, place, and time.  Skin: Skin is warm and dry.     Psychiatric: She has a normal mood and affect.    Labs reviewed:  Recent Labs  11/23/16 0730  06/04/17 07/01/17 0808 07/24/17 0931  NA 135  < > 134* 135 135  K 4.3  < > 5.5* 3.9 4.3  CL 98  --   --  100 99  CO2 29  --   --  28 27  GLUCOSE 74  --   --  80 85  BUN 15  < > 87* 17 19  CREATININE 1.02*  < > 0.9 0.86 0.83  CALCIUM 9.7  --   --  9.5 9.7  < > = values in this interval not displayed.  Recent Labs  05/21/17 05/28/17 06/04/17 07/24/17 0931  AST 14  --  15 17  17   ALT 18  --  14 11  11   ALKPHOS 123 90  --  90  90  BILITOT  --   --   --  0.4  0.4  PROT  --   --   --  7.0  7.0  ALBUMIN  --   --   --  3.6  3.6    Recent Labs  06/04/17 07/01/17 0808 07/24/17 0812  WBC 6.7 5.4 6.9  HGB 10.6* 11.1* 12.4  HCT 31* 32.7* 38.3  MCV  --  89.3 96.2  PLT 290 263 272   Lab Results  Component Value Date   TSH 33.00 (A) 04/16/2017   Lab Results  Component Value Date   HGBA1C 5.3 04/16/2017   Lab Results  Component Value Date   CHOL 188 04/12/2016   HDL 62 04/12/2016   LDLCALC 102 04/12/2016   TRIG 122 04/12/2016   CHOLHDL 3.0 Ratio 06/07/2008    Significant Diagnostic Results in last 30 days:  No results found.  Assessment/Plan Hyponatremia serum Na  135 07/24/17, f/u BMP in one month  Anemia Last Hgb 12.4 07/24/17, continue Vit B12, f/u CBC one month  Depression with anxiety she sleeps and eats well, taking Cymbalta 27m, off  Mirtazapine 7.574mqd since it make her too sleepy  HTN (hypertension) Controlled, continue Atenolol 12.11m qd     Family/ staff Communication: plan of care reviewed with the patient.   Labs/tests ordered: CBC BMP prior to the next appointment  Next appointment: one month

## 2017-08-01 NOTE — Assessment & Plan Note (Signed)
Controlled, continue Atenolol 12.78m qd

## 2017-08-01 NOTE — Patient Instructions (Signed)
CBC BMP prior to the next appointment, f/u in one month.

## 2017-08-21 DIAGNOSIS — D508 Other iron deficiency anemias: Secondary | ICD-10-CM | POA: Diagnosis not present

## 2017-08-22 ENCOUNTER — Non-Acute Institutional Stay: Payer: Medicare HMO | Admitting: Nurse Practitioner

## 2017-08-22 ENCOUNTER — Encounter: Payer: Self-pay | Admitting: Nurse Practitioner

## 2017-08-22 DIAGNOSIS — E871 Hypo-osmolality and hyponatremia: Secondary | ICD-10-CM

## 2017-08-22 DIAGNOSIS — I10 Essential (primary) hypertension: Secondary | ICD-10-CM

## 2017-08-22 DIAGNOSIS — D510 Vitamin B12 deficiency anemia due to intrinsic factor deficiency: Secondary | ICD-10-CM

## 2017-08-22 LAB — COMPREHENSIVE METABOLIC PANEL
AG Ratio: 1 (calc) (ref 1.0–2.5)
ALBUMIN MSPROF: 3.5 g/dL — AB (ref 3.6–5.1)
ALKALINE PHOSPHATASE (APISO): 94 U/L (ref 33–130)
ALT: 14 U/L (ref 6–29)
AST: 16 U/L (ref 10–35)
BUN: 18 mg/dL (ref 7–25)
CO2: 26 mmol/L (ref 20–32)
CREATININE: 0.86 mg/dL (ref 0.60–0.88)
Calcium: 9.8 mg/dL (ref 8.6–10.4)
Chloride: 100 mmol/L (ref 98–110)
Globulin: 3.5 g/dL (calc) (ref 1.9–3.7)
Glucose, Bld: 82 mg/dL (ref 65–99)
POTASSIUM: 4.1 mmol/L (ref 3.5–5.3)
Sodium: 135 mmol/L (ref 135–146)
TOTAL PROTEIN: 7 g/dL (ref 6.1–8.1)
Total Bilirubin: 0.3 mg/dL (ref 0.2–1.2)

## 2017-08-22 LAB — CBC
HCT: 36.4 % (ref 35.0–45.0)
Hemoglobin: 12 g/dL (ref 11.7–15.5)
MCH: 30.1 pg (ref 27.0–33.0)
MCHC: 33 g/dL (ref 32.0–36.0)
MCV: 91.2 fL (ref 80.0–100.0)
MPV: 12.7 fL — AB (ref 7.5–12.5)
PLATELETS: 241 10*3/uL (ref 140–400)
RBC: 3.99 10*6/uL (ref 3.80–5.10)
RDW: 12.9 % (ref 11.0–15.0)
WBC: 6.8 10*3/uL (ref 3.8–10.8)

## 2017-08-22 NOTE — Assessment & Plan Note (Addendum)
Hgb normalized, repeat CBC, continue Vit B12, Folate

## 2017-08-22 NOTE — Progress Notes (Signed)
Location:      Place of Service:  Clinic (12) Provider: Marlana Latus NP  Code Status: DNR Goals of Care:  Advanced Directives 08/22/2017  Does Patient Have a Medical Advance Directive? Yes  Type of Paramedic of [place];Living will  Does patient want to make changes to medical advance directive? No - Patient declined  Copy of [place] in Chart? -  Pre-existing out of facility DNR order (yellow form or pink MOST form) -     Chief Complaint  Patient presents with  . Acute Visit    Elevated BP, not feeling well    HPI: Patient is a 83 y.o. female seen today for an acute visit for elevated blood pressure x 24 hours, normalized today, her head pressure is resolved after her blood pressure is 140/108mHg today. She denied headache, vision change, chest pain/pressures, palpitation, nausea, vomiting, constipation, dizziness, or focal weakness. She denied dysuria, abd pain, or urinary urgency.   Hx of PSVT, heart rate is in control, taking Atenolol 12.526mqd. Hyponatremia, Na 135 today. Hgb is normalized, she takes Vit B12 and Folate daily.   Past Medical History:  Diagnosis Date  . Allergic rhinitis   . Anemia    NOS  . Anxiety   . Biceps tendon rupture    Bilateral  . [place] ([place]   Left s/p lumpectomy and XRT   . Bronchiectasis ([facility]   Noted on chest x-ray  . Cough 2014  . Depression   . Diverticulosis   . Elevated liver enzymes    Biopsy consistent with steatohepatitis  . Fundic gland polyps of stomach, benign   . GERD (gastroesophageal reflux disease) 06/2007   EGD Dr RoGala Romneysm [facility], multiple fundic gland polyps  . Glaucoma 2013   both eyes  . Hemorrhoids   . Hiatal hernia   . Hx of radiation therapy 07/17/10 to 07/21/10   SRS LLL lung  . Hyperlipidemia   . Insomnia   . Low back pain    scoliosis  . Lung cancer ([facility]6/2011   Left 2011 - rad x 3  . Lymphedema    Left arm  . Osteoarthritis   . Osteopenia  10/25/2016  . Osteoporosis, senile   . Overactive bladder   . Pneumonia    RLL with sepsis  . PSVT (paroxysmal supraventricular tachycardia) ([facility]  . Rheumatic fever    Age 89. Right thyroid nodule   . Steatohepatitis    liver bx  . Type 2 diabetes mellitus ([facility] [place]    Past Surgical History:  Procedure Laterality Date  . ABDOMINAL HYSTERECTOMY  1964  . APPENDECTOMY  1964  . BIOPSY THYROID  11/2008  . BREAST BIOPSY     X 3 w/ cystectomy  . BREAST LUMPECTOMY  2001  . CATARACT EXTRACTION Bilateral 2003   Lens implant Dr. EpCharise Killian. CHOLECYSTECTOMY  1965  . COLONOSCOPY  09/11/2011   Procedure: COLONOSCOPY;  Surgeon: SaDorothyann PengMD;  [facility];  Service: Endoscopy;  Laterality: N/A;  . COLONOSCOPY  2005 /2012   Dr. ReLucianne Mussdiverticulosis, ext hemorrhoids.  . CYSTOSCOPY  2007  . ESOPHAGOGASTRODUODENOSCOPY  09/11/2011   Procedure: ESOPHAGOGASTRODUODENOSCOPY (EGD);  Surgeon: SaDorothyann PengMD;  [facility];  Service: Endoscopy;  Laterality: N/A;  . LIVER BIOPSY  2008  . LUNG BIOPSY Left 06/06/2010  . REVISION TOTAL HIP ARTHROPLASTY  2007   Left  .  SKIN CANCER EXCISION     nose and left lower cheek  . TONSILLECTOMY  1930  . TOTAL HIP ARTHROPLASTY Bilateral 2005 & 2007    Dr. Earl Lagos. Aline Brochure     Allergies  Allergen Reactions  . Ibuprofen Hives  . Naproxen   . Statins Other (See Comments)    Feels like knives in stomach  . Surgilube [Gyne-Moistrin]     Rectal itching, burning  . Tape Rash    Allergies as of 08/22/2017      Reactions   Ibuprofen Hives   Naproxen    Statins Other (See Comments)   Feels like knives in stomach   Surgilube [gyne-moistrin]    Rectal itching, burning   Tape Rash      Medication List       Accurate as of 08/22/17  3:10 PM. Always use your most recent med list.          acetaminophen 325 MG tablet Commonly known as:  TYLENOL Take 650 mg by mouth. Take 2 tablets every 6 hours as needed   aspirin 81 MG  EC tablet Take 81 mg by mouth daily.   atenolol 12.5 mg Tabs tablet Commonly known as:  TENORMIN Take 12.5 mg by mouth daily.   brimonidine 0.2 % ophthalmic solution Commonly known as:  ALPHAGAN Place 1 drop into both eyes 3 (three) times daily.   cetirizine 10 MG tablet Commonly known as:  ZYRTEC Take 10 mg by mouth daily.   dorzolamide-timolol 22.3-6.8 MG/ML ophthalmic solution Commonly known as:  COSOPT 1 drop. One drop in each eye twice daily   DULoxetine 20 MG capsule Commonly known as:  CYMBALTA Take 1 capsule (20 mg total) by mouth daily.   lactose free nutrition Liqd Take 237 mLs by mouth. Serve in cup over ice once a day between meals   omeprazole 20 MG capsule Commonly known as:  PRILOSEC TAKE 1 CAPSULE EVERY DAY.   PRESERVISION AREDS 2 PO Take by mouth. Take one capsule twice a day for vision   ROBAFEN DM 10-100 MG/5ML liquid Generic drug:  Dextromethorphan-Guaifenesin Take 5 mLs by mouth. 10 ml by mouth every 6 hours as need for cough in 48 hours   vitamin B-12 1000 MCG tablet Commonly known as:  CYANOCOBALAMIN Take 1,000 mcg by mouth daily.       Review of Systems:  Review of Systems  Constitutional: Negative for activity change, appetite change, chills, diaphoresis, fatigue and fever.       Excises 3x/week.   HENT: Positive for hearing loss. Negative for congestion and voice change.        Corrective glasses, R+L hearing aids.   Eyes: Negative for visual disturbance.  Respiratory: Negative for cough, choking, chest tightness and shortness of breath.   Cardiovascular: Negative for chest pain, palpitations and leg swelling.  Gastrointestinal: Negative for abdominal distention, abdominal pain, constipation, diarrhea, nausea and vomiting.  Genitourinary: Positive for frequency. Negative for difficulty urinating, dysuria and urgency.       Nocturnal urinary frequency, 3-4x/night, able to return to sleep after bathroom trips.   Musculoskeletal: Positive  for gait problem.       Electric scooter to get around.   Neurological: Negative for dizziness, speech difficulty, weakness, numbness and headaches.  Psychiatric/Behavioral: Negative for agitation, behavioral problems, confusion and sleep disturbance. The patient is not nervous/anxious.     Health Maintenance  Topic Date Due  . FOOT EXAM  07/19/1931  . OPHTHALMOLOGY EXAM  07/19/1931  .  PNA vac Low Risk Adult (2 of 2 - PCV13) 08/18/2007  . URINE MICROALBUMIN  04/30/2008  . TETANUS/TDAP  12/18/2015  . INFLUENZA VACCINE  07/17/2017  . HEMOGLOBIN A1C  10/17/2017  . DEXA SCAN  Completed    Physical Exam: Vitals:   08/22/17 1431  BP: 140/80  Pulse: 66  Resp: 20  Temp: 98.4 F (36.9 C)  TempSrc: Oral  SpO2: 96%  Weight: 153 lb 6.4 oz (69.6 kg)  Height: 5' 5"  (1.651 m)   Body mass index is 25.53 kg/m. Physical Exam  Constitutional: She is oriented to person, place, and time. She appears well-developed and well-nourished. No distress.  HENT:  Head: Normocephalic and atraumatic.  Mouth/Throat: No oropharyngeal exudate.  Eyes: Pupils are equal, round, and reactive to light. Conjunctivae and EOM are normal. Right eye exhibits no discharge. Left eye exhibits no discharge.  Neck: Normal range of motion. Neck supple. No JVD present.  Cardiovascular: Normal rate, regular rhythm and normal heart sounds.   No murmur heard. Pulmonary/Chest: Effort normal and breath sounds normal. She has no wheezes. She has no rales.  Abdominal: Soft. Bowel sounds are normal. She exhibits no distension. There is no tenderness.  Musculoskeletal: She exhibits no edema or tenderness.  Neurological: She is alert and oriented to person, place, and time. No cranial nerve deficit. She exhibits normal muscle tone.  Skin: She is not diaphoretic.  Psychiatric: She has a normal mood and affect. Her behavior is normal. Judgment and thought content normal.    Labs reviewed: Basic Metabolic Panel:  Recent Labs   04/16/17  07/01/17 0808 07/24/17 0931 08/21/17 0000  NA 129*  < > 135 135 135  K 4.8  < > 3.9 4.3 4.1  CL  --   --  100 99 100  CO2  --   --  28 27 26   GLUCOSE  --   --  80 85 82  BUN 12  < > 17 19 18   CREATININE 0.8  < > 0.86 0.83 0.86  CALCIUM  --   --  9.5 9.7 9.8  TSH 33.00*  --   --   --   --   < > = values in this interval not displayed. Liver Function Tests:  Recent Labs  05/21/17 05/28/17 06/04/17 07/24/17 0931 08/21/17 0000  AST 14  --  15 17  17 16   ALT 18  --  14 11  11 14   ALKPHOS 123 90  --  90  90  --   BILITOT  --   --   --  0.4  0.4 0.3  PROT  --   --   --  7.0  7.0 7.0  ALBUMIN  --   --   --  3.6  3.6  --    No results for input(s): LIPASE, AMYLASE in the last 8760 hours. No results for input(s): AMMONIA in the last 8760 hours. CBC:  Recent Labs  07/01/17 0808 07/24/17 0812 08/21/17 0000  WBC 5.4 6.9 6.8  HGB 11.1* 12.4 12.0  HCT 32.7* 38.3 36.4  MCV 89.3 96.2 91.2  PLT 263 272 241   Lipid Panel: No results for input(s): CHOL, HDL, LDLCALC, TRIG, CHOLHDL, LDLDIRECT in the last 8760 hours. Lab Results  Component Value Date   HGBA1C 5.3 04/16/2017    Procedures since last visit: No results found.  Assessment/Plan HTN (hypertension) Bp 140/80 mmHg today, resolved head pressure. 08/21/17 148/59, 134/64, 138/56, 08/22/17 148/56, 152/59, 150/59 blood pressure  measurements from home. Continue Atenolol 12.75m qd, prn Atenolol 12.578mprn daily.   Hyponatremia Liberalize diet in sodium, repeat BMP one month.   Anemia Hgb normalized, repeat CBC, continue Vit B12, Folate    Labs/tests ordered: CBC BMP monthly  Next appt:  One month

## 2017-08-22 NOTE — Assessment & Plan Note (Signed)
Bp 140/80 mmHg today, resolved head pressure. 08/21/17 148/59, 134/64, 138/56, 08/22/17 148/56, 152/59, 150/59 blood pressure measurements from home. Continue Atenolol 12.33m qd, prn Atenolol 12.558mprn daily.

## 2017-08-22 NOTE — Patient Instructions (Signed)
F/u in one month, CBC BMP prior to the appointment.

## 2017-08-22 NOTE — Assessment & Plan Note (Signed)
Liberalize diet in sodium, repeat BMP one month.

## 2017-08-29 ENCOUNTER — Encounter: Payer: Self-pay | Admitting: Nurse Practitioner

## 2017-09-03 ENCOUNTER — Other Ambulatory Visit: Payer: Self-pay | Admitting: Nurse Practitioner

## 2017-09-03 ENCOUNTER — Other Ambulatory Visit: Payer: Self-pay | Admitting: Internal Medicine

## 2017-09-03 DIAGNOSIS — F411 Generalized anxiety disorder: Secondary | ICD-10-CM

## 2017-09-05 ENCOUNTER — Encounter: Payer: Self-pay | Admitting: Nurse Practitioner

## 2017-09-11 DIAGNOSIS — R197 Diarrhea, unspecified: Secondary | ICD-10-CM | POA: Insufficient documentation

## 2017-09-16 DIAGNOSIS — R69 Illness, unspecified: Secondary | ICD-10-CM | POA: Diagnosis not present

## 2017-09-19 ENCOUNTER — Non-Acute Institutional Stay: Payer: Medicare HMO

## 2017-09-19 VITALS — BP 140/52 | HR 72 | Temp 98.1°F | Ht 65.0 in | Wt 153.0 lb

## 2017-09-19 DIAGNOSIS — Z0000 Encounter for general adult medical examination without abnormal findings: Secondary | ICD-10-CM

## 2017-09-19 MED ORDER — PNEUMOCOCCAL 13-VAL CONJ VACC IM SUSP: 0.5000 mL | INTRAMUSCULAR | 0 refills | Status: AC

## 2017-09-19 MED ORDER — TETANUS-DIPHTH-ACELL PERTUSSIS 5-2.5-18.5 LF-MCG/0.5 IM SUSP: 0.5000 mL | Freq: Once | INTRAMUSCULAR | 0 refills | Status: AC

## 2017-09-19 NOTE — Patient Instructions (Signed)
[name] , Thank you for taking time to come for your Medicare Wellness Visit. I appreciate your ongoing commitment to your health goals. Please review the following plan we discussed and let me know if I can assist you in the future.   Screening recommendations/referrals: Colonoscopy excluded, you are over age 81 Mammogram excluded, you are over age 43 Bone Density up to date Recommended yearly ophthalmology/optometry visit for glaucoma screening and checkup Recommended yearly dental visit for hygiene and checkup  Vaccinations: Influenza vaccine due, please get when facility gets it in stock Pneumococcal vaccine 13 due, prescription sent to your pharamcy Tdap vaccine due, prescription sent to your pharmacy Shingles vaccine due, let us know if you want this prescription    Advanced directives: In chart  Conditions/risks identified: none  Next appointment: [facility] 10/10/2017 1:30pm   Preventive Care 65 Years and Older, Female Preventive care refers to lifestyle choices and visits with your health care provider that can promote health and wellness. What does preventive care include?  A yearly physical exam. This is also called an annual well check.  Dental exams once or twice a year.  Routine eye exams. Ask your health care provider how often you should have your eyes checked.  Personal lifestyle choices, including:  Daily care of your teeth and gums.  Regular physical activity.  Eating a healthy diet.  Avoiding tobacco and drug use.  Limiting alcohol use.  Practicing safe sex.  Taking low-dose aspirin every day.  Taking vitamin and mineral supplements as recommended by your health care provider. What happens during an annual well check? The services and screenings done by your health care provider during your annual well check will depend on your age, overall health, lifestyle risk factors, and family history of disease. Counseling  Your health care provider may ask  you questions about your:  Alcohol use.  Tobacco use.  Drug use.  Emotional well-being.  Home and relationship well-being.  Sexual activity.  Eating habits.  History of falls.  Memory and ability to understand (cognition).  Work and work Statistician.  Reproductive health. Screening  You may have the following tests or measurements:  Height, weight, and BMI.  Blood pressure.  Lipid and cholesterol levels. These may be checked every 5 years, or more frequently if you are over 19 years old.  Skin check.  Lung cancer screening. You may have this screening every year starting at age 46 if you have a 30-pack-year history of smoking and currently smoke or have quit within the past 15 years.  Fecal occult blood test (FOBT) of the stool. You may have this test every year starting at age 28.  Flexible sigmoidoscopy or colonoscopy. You may have a sigmoidoscopy every 5 years or a colonoscopy every 10 years starting at age 5.  Hepatitis C blood test.  Hepatitis B blood test.  Sexually transmitted disease (STD) testing.  Diabetes screening. This is done by checking your blood sugar (glucose) after you have not eaten for a while (fasting). You may have this done every 1-3 years.  Bone density scan. This is done to screen for osteoporosis. You may have this done starting at age 44.  Mammogram. This may be done every 1-2 years. Talk to your health care provider about how often you should have regular mammograms. Talk with your health care provider about your test results, treatment options, and if necessary, the need for more tests. Vaccines  Your health care provider may recommend certain vaccines, such as:  Influenza vaccine. This is recommended every year.  Tetanus, diphtheria, and acellular pertussis (Tdap, Td) vaccine. You may need a Td booster every 10 years.  Zoster vaccine. You may need this after age 41.  Pneumococcal 13-valent conjugate (PCV13) vaccine. One dose  is recommended after age 56.  Pneumococcal polysaccharide (PPSV23) vaccine. One dose is recommended after age 89. Talk to your health care provider about which screenings and vaccines you need and how often you need them. This information is not intended to replace advice given to you by your health care provider. Make sure you discuss any questions you have with your health care provider. Document Released: 12/30/2015 Document Revised: 08/22/2016 Document Reviewed: 10/04/2015 Elsevier Interactive Patient Education  2017 [employer] Prevention in the Home Falls can cause injuries. They can happen to people of all ages. There are many things you can do to make your home safe and to help prevent falls. What can I do on the outside of my home?  Regularly fix the edges of walkways and driveways and fix any cracks.  Remove anything that might make you trip as you walk through a door, such as a raised step or threshold.  Trim any bushes or trees on the path to your home.  Use bright outdoor lighting.  Clear any walking paths of anything that might make someone trip, such as rocks or tools.  Regularly check to see if handrails are loose or broken. Make sure that both sides of any steps have handrails.  Any raised decks and porches should have guardrails on the edges.  Have any leaves, snow, or ice cleared regularly.  Use sand or salt on walking paths during winter.  Clean up any spills in your garage right away. This includes oil or grease spills. What can I do in the bathroom?  Use night lights.  Install grab bars by the toilet and in the tub and shower. Do not use towel bars as grab bars.  Use non-skid mats or decals in the tub or shower.  If you need to sit down in the shower, use a plastic, non-slip stool.  Keep the floor dry. Clean up any water that spills on the floor as soon as it happens.  Remove soap buildup in the tub or shower regularly.  Attach bath mats  securely with double-sided non-slip rug tape.  Do not have throw rugs and other things on the floor that can make you trip. What can I do in the bedroom?  Use night lights.  Make sure that you have a light by your bed that is easy to reach.  Do not use any sheets or blankets that are too big for your bed. They should not hang down onto the floor.  Have a firm chair that has side arms. You can use this for support while you get dressed.  Do not have throw rugs and other things on the floor that can make you trip. What can I do in the kitchen?  Clean up any spills right away.  Avoid walking on wet floors.  Keep items that you use a lot in easy-to-reach places.  If you need to reach something above you, use a strong step stool that has a grab bar.  Keep electrical cords out of the way.  Do not use floor polish or wax that makes floors slippery. If you must use wax, use non-skid floor wax.  Do not have throw rugs and other things on the floor that  can make you trip. What can I do with my stairs?  Do not leave any items on the stairs.  Make sure that there are handrails on both sides of the stairs and use them. Fix handrails that are broken or loose. Make sure that handrails are as long as the stairways.  Check any carpeting to make sure that it is firmly attached to the stairs. Fix any carpet that is loose or worn.  Avoid having throw rugs at the top or bottom of the stairs. If you do have throw rugs, attach them to the floor with carpet tape.  Make sure that you have a light switch at the top of the stairs and the bottom of the stairs. If you do not have them, ask someone to add them for you. What else can I do to help prevent falls?  Wear shoes that:  Do not have high heels.  Have rubber bottoms.  Are comfortable and fit you well.  Are closed at the toe. Do not wear sandals.  If you use a stepladder:  Make sure that it is fully opened. Do not climb a closed  stepladder.  Make sure that both sides of the stepladder are locked into place.  Ask someone to hold it for you, if possible.  Clearly mark and make sure that you can see:  Any grab bars or handrails.  First and last steps.  Where the edge of each step is.  Use tools that help you move around (mobility aids) if they are needed. These include:  Canes.  Walkers.  Scooters.  Crutches.  Turn on the lights when you go into a dark area. Replace any light bulbs as soon as they burn out.  Set up your furniture so you have a clear path. Avoid moving your furniture around.  If any of your floors are uneven, fix them.  If there are any pets around you, be aware of where they are.  Review your medicines with your doctor. Some medicines can make you feel dizzy. This can increase your chance of falling. Ask your doctor what other things that you can do to help prevent falls. This information is not intended to replace advice given to you by your health care provider. Make sure you discuss any questions you have with your health care provider. Document Released: 09/29/2009 Document Revised: 05/10/2016 Document Reviewed: 01/07/2015 Elsevier Interactive Patient Education  2017 [employer].

## 2017-09-19 NOTE — Progress Notes (Signed)
Subjective:   [name] is a 83 y.o. female who presents for an Initial Medicare Annual Wellness Visit at [facility]    Objective:    Today's Vitals   09/19/17 1406  BP: (!) 140/52  Pulse: 72  Temp: 98.1 F (36.7 C)  TempSrc: Oral  SpO2: 96%  Weight: 153 lb (69.4 kg)  Height: 5' 5"  (1.651 m)   Body mass index is 25.46 kg/m.   Current Medications (verified) Outpatient Encounter Prescriptions as of 09/19/2017  Medication Sig  . acetaminophen (TYLENOL) 325 MG tablet Take 650 mg by mouth. Take 2 tablets every 6 hours as needed  . aspirin 81 MG EC tablet Take 81 mg by mouth daily.    Marland Kitchen atenolol (TENORMIN) 25 MG tablet TAKE (1/2) TABLET DAILY FOR BLOOD PRESSURE.  . brimonidine (ALPHAGAN) 0.2 % ophthalmic solution Place 1 drop into both eyes 3 (three) times daily.  . cetirizine (ZYRTEC) 10 MG tablet Take 10 mg by mouth daily.  Marland Kitchen Dextromethorphan-Guaifenesin (ROBAFEN DM) 10-100 MG/5ML liquid Take 5 mLs by mouth. 10 ml by mouth every 6 hours as need for cough in 48 hours  . dorzolamide-timolol (COSOPT) 22.3-6.8 MG/ML ophthalmic solution 1 drop. One drop in each eye twice daily  . DULoxetine (CYMBALTA) 20 MG capsule TAKE (1) CAPSULE DAILY.  Marland Kitchen Ferrous Gluconate (IRON 27 PO) Take 1 tablet by mouth daily.  . folic acid (FOLVITE) 027 MCG tablet Take 400 mcg by mouth daily.  Marland Kitchen lactose free nutrition (BOOST) LIQD Take 237 mLs by mouth. Serve in cup over ice once a day between meals  . latanoprost (XALATAN) 0.005 % ophthalmic solution Place 1 drop into both eyes at bedtime.  . Multiple Vitamins-Minerals (PRESERVISION AREDS 2 PO) Take by mouth. Take one capsule twice a day for vision  . omeprazole (PRILOSEC) 20 MG capsule TAKE 1 CAPSULE EVERY DAY.  Marland Kitchen pneumococcal 13-valent conjugate vaccine (PREVNAR 13) SUSP injection Inject 0.5 mLs into the muscle tomorrow at 10 am.  . Tdap (BOOSTRIX) 5-2.5-18.5 LF-MCG/0.5 injection Inject 0.5 mLs into the muscle once.    . vitamin B-12 (CYANOCOBALAMIN) 1000 MCG tablet Take 1,000 mcg by mouth daily.  . [DISCONTINUED] pneumococcal 13-valent conjugate vaccine (PREVNAR 13) SUSP injection Inject 0.5 mLs into the muscle tomorrow at 10 am.  . [DISCONTINUED] Tdap (BOOSTRIX) 5-2.5-18.5 LF-MCG/0.5 injection Inject 0.5 mLs into the muscle once.   No facility-administered encounter medications on file as of 09/19/2017.     Allergies (verified) Ibuprofen; Naproxen; Statins; Surgilube [gyne-moistrin]; and Tape   History: Past Medical History:  Diagnosis Date  . Allergic rhinitis   . Anemia    NOS  . Anxiety   . Biceps tendon rupture    Bilateral  . Breast cancer ([place]) 2001   Left s/p lumpectomy and XRT   . Bronchiectasis ([place]) 2014   Noted on chest x-ray  . Cough 2014  . Depression   . Diverticulosis   . Elevated liver enzymes    Biopsy consistent with steatohepatitis  . Fundic gland polyps of stomach, benign   . GERD (gastroesophageal reflux disease) 06/2007   EGD Dr Gala Romney >sm HH, multiple fundic gland polyps  . Glaucoma 2013   both eyes  . Hemorrhoids   . Hiatal hernia   . Hx of radiation therapy 07/17/10 to 07/21/10   SRS LLL lung  . Hyperlipidemia   . Insomnia   . Low back pain    scoliosis  . Lung cancer ([place]) 05/2010  Left 2011 - rad x 3  . Lymphedema    Left arm  . Osteoarthritis   . Osteopenia 10/25/2016  . Osteoporosis, senile   . Overactive bladder   . Pneumonia    RLL with sepsis  . PSVT (paroxysmal supraventricular tachycardia) ([place])   . Rheumatic fever    Age 16  . Right thyroid nodule   . Steatohepatitis    liver bx  . Type 2 diabetes mellitus ([place])    Past Surgical History:  Procedure Laterality Date  . ABDOMINAL HYSTERECTOMY  1964  . APPENDECTOMY  1964  . BIOPSY THYROID  11/2008  . BREAST BIOPSY     X 3 w/ cystectomy  . BREAST LUMPECTOMY  2001  . CATARACT EXTRACTION Bilateral 2003   Lens implant Dr. Charise Killian  . CHOLECYSTECTOMY  1965  . COLONOSCOPY  09/11/2011    Procedure: COLONOSCOPY;  Surgeon: Dorothyann Peng, MD;  [facility];  Service: Endoscopy;  Laterality: N/A;  . COLONOSCOPY  2005 /2012   Dr. Lucianne Muss- diverticulosis, ext hemorrhoids.  . CYSTOSCOPY  2007  . ESOPHAGOGASTRODUODENOSCOPY  09/11/2011   Procedure: ESOPHAGOGASTRODUODENOSCOPY (EGD);  Surgeon: Dorothyann Peng, MD;  [facility];  Service: Endoscopy;  Laterality: N/A;  . LIVER BIOPSY  2008  . LUNG BIOPSY Left 06/06/2010  . REVISION TOTAL HIP ARTHROPLASTY  2007   Left  . SKIN CANCER EXCISION     nose and left lower cheek  . TONSILLECTOMY  1930  . TOTAL HIP ARTHROPLASTY Bilateral 2005 & 2007    Dr. Earl Lagos. Aline Brochure    Family History  Problem Relation Age of Onset  . Heart failure Mother   . Osteoporosis Mother   . Hypertension Father   . Stroke Father   . Heart failure Father   . Leukemia Brother   . Heart failure Brother   . Cancer Maternal Grandmother   . Stroke Maternal Grandfather   . Cancer Unknown    Social History   Occupational History  . retired Tax Therapist, nutritional Retired   Social History Main Topics  . Smoking status: Never Smoker  . Smokeless tobacco: Never Used     Comment: 2nd hand-husband  . Alcohol use No  . Drug use: No  . Sexual activity: Not Currently    Tobacco Counseling Counseling given: Not Answered   Activities of Daily Living In your present state of health, do you have any difficulty performing the following activities: 09/19/2017  Hearing? N  Vision? N  Difficulty concentrating or making decisions? N  Walking or climbing stairs? Y  Dressing or bathing? N  Doing errands, shopping? Y  Preparing Food and eating ? N  Using the Toilet? N  In the past six months, have you accidently leaked urine? N  Do you have problems with loss of bowel control? Y  Managing your Medications? N  Managing your Finances? N  Housekeeping or managing your Housekeeping? N  Some recent data might be hidden    Immunizations and Health  Maintenance Immunization History  Administered Date(s) Administered  . Influenza Whole 09/22/2008, 09/16/2012, 09/30/2013  . Influenza-Unspecified 10/02/2014, 09/15/2015, 09/27/2016  . Pneumococcal Polysaccharide-23 08/17/2006  . Td 12/17/2005   Health Maintenance Due  Topic Date Due  . FOOT EXAM  07/19/1931  . OPHTHALMOLOGY EXAM  07/19/1931  . PNA vac Low Risk Adult (2 of 2 - PCV13) 08/18/2007  . URINE MICROALBUMIN  04/30/2008  . TETANUS/TDAP  12/18/2015  . INFLUENZA VACCINE  07/17/2017  Patient Care Team: Blanchie Serve, MD as PCP - General (Internal Medicine) Lanelle Bal., MD as Referring Physician (Internal Medicine) Satira Sark, MD as Consulting Physician (Cardiology) Kyung Rudd, MD as Consulting Physician (Radiation Oncology)  Indicate any recent Medical Services you may have received from other than [facility] providers in the past year (date may be approximate).     Assessment:   This is a routine wellness examination for [name].   Hearing/Vision screen No exam data present  Dietary issues and exercise activities discussed: Current Exercise Habits: Home exercise routine, Type of exercise: stretching;Other - see comments (PT moves), Time (Minutes): 15, Frequency (Times/Week): 7, Weekly Exercise (Minutes/Week): 105, Intensity: Mild, Exercise limited by: orthopedic condition(s)  Goals    . Maintain LIfestyle          Starting today pt will maintain lifestyle.       Depression Screen PHQ 2/9 Scores 09/19/2017 05/17/2016 04/20/2016 04/19/2016  PHQ - 2 Score 0 0 0 0    Fall Risk Fall Risk  09/19/2017 05/17/2016 04/20/2016 04/19/2016  Falls in the past year? No No No No    Cognitive Function: MMSE - Mini Mental State Exam 09/19/2017  Orientation to time 5  Orientation to Place 5  Registration 3  Attention/ Calculation 5  Recall 3  Language- name 2 objects 2  Language- repeat 1  Language- follow 3 step command 3  Language- read & follow direction 1  Write a  sentence 1  Copy design 1  Total score 30        Screening Tests Health Maintenance  Topic Date Due  . FOOT EXAM  07/19/1931  . OPHTHALMOLOGY EXAM  07/19/1931  . PNA vac Low Risk Adult (2 of 2 - PCV13) 08/18/2007  . URINE MICROALBUMIN  04/30/2008  . TETANUS/TDAP  12/18/2015  . INFLUENZA VACCINE  07/17/2017  . HEMOGLOBIN A1C  10/17/2017  . DEXA SCAN  Completed      Plan:    I have personally reviewed and addressed the Medicare Annual Wellness questionnaire and have noted the following in the patient's chart:  A. Medical and social history B. Use of alcohol, tobacco or illicit drugs  C. Current medications and supplements D. Functional ability and status E.  Nutritional status F.  Physical activity G. Advance directives H. List of other physicians I.  Hospitalizations, surgeries, and ER visits in previous 12 months J.  [place] to include hearing, vision, cognitive, depression L. Referrals and appointments - none  In addition, I have reviewed and discussed with patient certain preventive protocols, quality metrics, and best practice recommendations. A written personalized care plan for preventive services as well as general preventive health recommendations were provided to patient.  See attached scanned questionnaire for additional information.   Signed,   Rich Reining, RN Nurse Health Advisor   Quick Notes   Health Maintenance: Prevnar and tdap prescriptions sent to your pharmacy. Flu vaccine will be given when facility gets it in stock.     Abnormal Screen: MMSE 30/30. Passed clock drawing.     Patient Concerns: None     Nurse Concerns: None

## 2017-09-25 DIAGNOSIS — Z23 Encounter for immunization: Secondary | ICD-10-CM | POA: Diagnosis not present

## 2017-09-26 ENCOUNTER — Encounter: Payer: Self-pay | Admitting: Nurse Practitioner

## 2017-10-09 DIAGNOSIS — E871 Hypo-osmolality and hyponatremia: Secondary | ICD-10-CM | POA: Diagnosis not present

## 2017-10-09 DIAGNOSIS — D510 Vitamin B12 deficiency anemia due to intrinsic factor deficiency: Secondary | ICD-10-CM | POA: Diagnosis not present

## 2017-10-10 ENCOUNTER — Encounter: Payer: Self-pay | Admitting: Nurse Practitioner

## 2017-10-10 ENCOUNTER — Non-Acute Institutional Stay: Payer: Medicare HMO | Admitting: Nurse Practitioner

## 2017-10-10 DIAGNOSIS — N318 Other neuromuscular dysfunction of bladder: Secondary | ICD-10-CM | POA: Diagnosis not present

## 2017-10-10 DIAGNOSIS — I10 Essential (primary) hypertension: Secondary | ICD-10-CM | POA: Diagnosis not present

## 2017-10-10 DIAGNOSIS — F418 Other specified anxiety disorders: Secondary | ICD-10-CM | POA: Diagnosis not present

## 2017-10-10 DIAGNOSIS — I471 Supraventricular tachycardia: Secondary | ICD-10-CM

## 2017-10-10 DIAGNOSIS — R69 Illness, unspecified: Secondary | ICD-10-CM | POA: Diagnosis not present

## 2017-10-10 DIAGNOSIS — E871 Hypo-osmolality and hyponatremia: Secondary | ICD-10-CM

## 2017-10-10 DIAGNOSIS — D510 Vitamin B12 deficiency anemia due to intrinsic factor deficiency: Secondary | ICD-10-CM | POA: Diagnosis not present

## 2017-10-10 LAB — BASIC METABOLIC PANEL
BUN: 20 mg/dL (ref 7–25)
CALCIUM: 9.8 mg/dL (ref 8.6–10.4)
CO2: 28 mmol/L (ref 20–32)
Chloride: 98 mmol/L (ref 98–110)
Creat: 0.82 mg/dL (ref 0.60–0.88)
GLUCOSE: 97 mg/dL (ref 65–99)
Potassium: 3.8 mmol/L (ref 3.5–5.3)
Sodium: 134 mmol/L — ABNORMAL LOW (ref 135–146)

## 2017-10-10 LAB — CBC
HCT: 37.2 % (ref 35.0–45.0)
HEMOGLOBIN: 12.6 g/dL (ref 11.7–15.5)
MCH: 30.1 pg (ref 27.0–33.0)
MCHC: 33.9 g/dL (ref 32.0–36.0)
MCV: 88.8 fL (ref 80.0–100.0)
MPV: 12.4 fL (ref 7.5–12.5)
PLATELETS: 231 10*3/uL (ref 140–400)
RBC: 4.19 10*6/uL (ref 3.80–5.10)
RDW: 13.9 % (ref 11.0–15.0)
WBC: 10.7 10*3/uL (ref 3.8–10.8)

## 2017-10-10 NOTE — Progress Notes (Signed)
Location:   [facility]   Place of Service:  Clinic (12) Provider: Marlana Latus NP  Code Status: DNR Goals of Care: [place] 10/10/2017  Does Patient Have a Medical Advance Directive? Yes  Type of Advance Directive [place]  Does patient want to make changes to medical advance directive? No - Patient declined  Copy of [place] in Chart? Yes  Pre-existing out of facility DNR order (yellow form or pink MOST form) -     Chief Complaint  Patient presents with  . Medical Management of Chronic Issues    HPI: Patient is a 83 y.o. female seen today for evaluation of her chronic medical conditions. History of hyponatremia, Na 134 10/10/17. Blood pressure, taking Atenolol 62m qam, her blood pressures measurements from home showed Sbp in 150-160s am, normalizes in pm, her Dbp <90s, but sometimes in 50s im pm. She denies headache, dizziness, change of vision, chest pain, SOB, cough, abd pain, constipation, diarrhea, or muscle weakness/spasm. She sleeps and eat well, taking Cymbalta 278mqd. She has been doing Kegel's exercise for urinary leakage with little effect, she admitted to urinate 2-3x/night, but its out of her habit-wakes up then go to bathroom then go back to sleep.   Past Medical History:  Diagnosis Date  . Allergic rhinitis   . Anemia    NOS  . Anxiety   . Biceps tendon rupture    Bilateral  . Breast [place]   Left s/p lumpectomy and XRT   . Bronchiectasis ([facility]   Noted on chest x-ray  . Cough 2014  . Depression   . Diverticulosis   . Elevated liver enzymes    Biopsy consistent with steatohepatitis  . Fundic gland polyps of stomach, benign   . GERD (gastroesophageal reflux disease) 06/2007   EGD Dr RoGala Romneysm [facility], multiple fundic gland polyps  . Glaucoma 2013   both eyes  . Hemorrhoids   . Hiatal hernia   . Hx of radiation therapy 07/17/10 to 07/21/10   SRS LLL lung  . Hyperlipidemia   . Insomnia   .  Low back pain    scoliosis  . Lung cancer ([facility]/2011   Left 2011 - rad x 3  . Lymphedema    Left arm  . Osteoarthritis   . Osteopenia 10/25/2016  . Osteoporosis, senile   . Overactive bladder   . Pneumonia    RLL with sepsis  . PSVT (paroxysmal supraventricular tachycardia) ([facility]  . Rheumatic fever    Age 83. Right thyroid nodule   . Steatohepatitis    liver bx  . Type 2 diabetes mellitus ([facility]    Past Surgical History:  Procedure Laterality Date  . ABDOMINAL HYSTERECTOMY  1964  . APPENDECTOMY  1964  . BIOPSY THYROID  11/2008  . BREAST BIOPSY     X 3 w/ cystectomy  . BREAST LUMPECTOMY  2001  . CATARACT EXTRACTION Bilateral 2003   Lens implant Dr. EpCharise Killian. CHOLECYSTECTOMY  1965  . COLONOSCOPY  09/11/2011   Procedure: COLONOSCOPY;  Surgeon: SaDorothyann PengMD;  [facility];  Service: Endoscopy;  Laterality: N/A;  . COLONOSCOPY  2005 /2012   Dr. ReLucianne Mussdiverticulosis, ext hemorrhoids.  . CYSTOSCOPY  2007  . ESOPHAGOGASTRODUODENOSCOPY  09/11/2011   Procedure: ESOPHAGOGASTRODUODENOSCOPY (EGD);  Surgeon: SaDorothyann PengMD;  [facility];  Service: Endoscopy;  Laterality: N/A;  . LIVER BIOPSY  2008  . LUNG BIOPSY Left 06/06/2010  . REVISION TOTAL HIP ARTHROPLASTY  2007   Left  . SKIN CANCER EXCISION     nose and left lower cheek  . TONSILLECTOMY  1930  . TOTAL HIP ARTHROPLASTY Bilateral 2005 & 2007    Dr. Earl Lagos. Aline Brochure     Allergies  Allergen Reactions  . Ibuprofen Hives  . Naproxen   . Statins Other (See Comments)    Feels like knives in stomach  . Surgilube [Gyne-Moistrin]     Rectal itching, burning  . Tape Rash    Allergies as of 10/10/2017      Reactions   Ibuprofen Hives   Naproxen    Statins Other (See Comments)   Feels like knives in stomach   Surgilube [gyne-moistrin]    Rectal itching, burning   Tape Rash      Medication List       Accurate as of 10/10/17 11:59 PM. Always use your most recent med list.            acetaminophen 325 MG tablet Commonly known as:  TYLENOL Take 650 mg by mouth. Take 2 tablets every 6 hours as needed   aspirin 81 MG EC tablet Take 81 mg by mouth daily.   atenolol 25 MG tablet Commonly known as:  TENORMIN TAKE (1/2) TABLET DAILY FOR BLOOD PRESSURE.   brimonidine 0.2 % ophthalmic solution Commonly known as:  ALPHAGAN Place 1 drop into both eyes 3 (three) times daily.   cetirizine 10 MG tablet Commonly known as:  ZYRTEC Take 10 mg by mouth daily.   dorzolamide-timolol 22.3-6.8 MG/ML ophthalmic solution Commonly known as:  COSOPT 1 drop. One drop in each eye twice daily   DULoxetine 20 MG capsule Commonly known as:  CYMBALTA TAKE (1) CAPSULE DAILY.   folic acid 433 MCG tablet Commonly known as:  FOLVITE Take 400 mcg by mouth daily.   IRON 27 PO Take 1 tablet by mouth daily.   lactose free nutrition Liqd Take 237 mLs by mouth. Serve in cup over ice once a day between meals   latanoprost 0.005 % ophthalmic solution Commonly known as:  XALATAN Place 1 drop into both eyes at bedtime.   omeprazole 20 MG capsule Commonly known as:  PRILOSEC TAKE 1 CAPSULE EVERY DAY.   PRESERVISION AREDS 2 PO Take by mouth. Take one capsule twice a day for vision   ROBAFEN DM 10-100 MG/5ML liquid Generic drug:  Dextromethorphan-Guaifenesin Take 5 mLs by mouth. 10 ml by mouth every 6 hours as need for cough in 48 hours   vitamin B-12 1000 MCG tablet Commonly known as:  CYANOCOBALAMIN Take 1,000 mcg by mouth daily.      Review of Systems: daughter was presented during today's visit.  Review of Systems  Constitutional: Negative for activity change, appetite change, chills, diaphoresis, fatigue and fever.  HENT: Positive for hearing loss. Negative for congestion, postnasal drip, rhinorrhea, trouble swallowing and voice change.   Eyes: Negative for visual disturbance.  Respiratory: Negative for cough, choking, chest tightness, shortness of breath and  wheezing.   Cardiovascular: Positive for palpitations. Negative for chest pain and leg swelling.       Chronic frequent PACs  Gastrointestinal: Negative for abdominal distention, anal bleeding, constipation, diarrhea, nausea and vomiting.  Endocrine: Negative for cold intolerance.  Genitourinary: Positive for frequency. Negative for difficulty urinating, dysuria and urgency.  Musculoskeletal: Positive for gait problem. Negative for myalgias and neck pain.  Walking with walker in her apartment, electric scooter to go further.   Skin: Negative for pallor, rash and wound.  Neurological: Negative for dizziness, speech difficulty, weakness, light-headedness and headaches.  Psychiatric/Behavioral: Negative for agitation, behavioral problems, confusion and sleep disturbance. The patient is not nervous/anxious.     Health Maintenance  Topic Date Due  . FOOT EXAM  07/19/1931  . OPHTHALMOLOGY EXAM  07/19/1931  . PNA vac Low Risk Adult (2 of 2 - PCV13) 08/18/2007  . URINE MICROALBUMIN  04/30/2008  . TETANUS/TDAP  12/18/2015  . HEMOGLOBIN A1C  10/17/2017  . INFLUENZA VACCINE  Completed  . DEXA SCAN  Completed    Physical Exam: Vitals:   10/10/17 1326  BP: 132/70  Pulse: 78  Resp: 18  Temp: 97.6 F (36.4 C)  SpO2: 94%  Weight: 155 lb 6.4 oz (70.5 kg)  Height: 5' 5"  (1.651 m)   Body mass index is 25.86 kg/m. Physical Exam  Constitutional: She is oriented to person, place, and time. She appears well-developed and well-nourished.  HENT:  Head: Normocephalic and atraumatic.  Mouth/Throat: Oropharynx is clear and moist.  Eyes: Pupils are equal, round, and reactive to light. Conjunctivae and EOM are normal. Right eye exhibits no discharge. Left eye exhibits no discharge.  Neck: Normal range of motion. Neck supple. No JVD present.  Cardiovascular: Normal rate and normal heart sounds.   No murmur heard. Frequent PACs  Pulmonary/Chest: Effort normal and breath sounds normal. She has  no wheezes. She has no rales.  Abdominal: Soft. Bowel sounds are normal. She exhibits no distension. There is no tenderness.  Musculoskeletal: Normal range of motion. She exhibits no edema or tenderness.  Neurological: She is alert and oriented to person, place, and time. No cranial nerve deficit. She exhibits normal muscle tone. Coordination normal.  Skin: Skin is warm and dry. No rash noted. No erythema. No pallor.  Psychiatric: She has a normal mood and affect. Her behavior is normal. Judgment and thought content normal.    Labs reviewed: Basic Metabolic Panel:  Recent Labs  04/16/17  07/24/17 0931 08/21/17 0000 10/09/17 0700  NA 129*  < > 135 135 134*  K 4.8  < > 4.3 4.1 3.8  CL  --   < > 99 100 98  CO2  --   < > 27 26 28   GLUCOSE  --   < > 85 82 97  BUN 12  < > 19 18 20   CREATININE 0.8  < > 0.83 0.86 0.82  CALCIUM  --   < > 9.7 9.8 9.8  TSH 33.00*  --   --   --   --   < > = values in this interval not displayed. Liver Function Tests:  Recent Labs  05/21/17 05/28/17 06/04/17 07/24/17 0931 08/21/17 0000  AST 14  --  15 17  17 16   ALT 18  --  14 11  11 14   ALKPHOS 123 90  --  90  90  --   BILITOT  --   --   --  0.4  0.4 0.3  PROT  --   --   --  7.0  7.0 7.0  ALBUMIN  --   --   --  3.6  3.6  --    No results for input(s): LIPASE, AMYLASE in the last 8760 hours. No results for input(s): AMMONIA in the last 8760 hours. CBC:  Recent Labs  07/24/17 0812 08/21/17 0000 10/09/17 0700  WBC 6.9  6.8 10.7  HGB 12.4 12.0 12.6  HCT 38.3 36.4 37.2  MCV 96.2 91.2 88.8  PLT 272 241 231   Lipid Panel: No results for input(s): CHOL, HDL, LDLCALC, TRIG, CHOLHDL, LDLDIRECT in the last 8760 hours. Lab Results  Component Value Date   HGBA1C 5.3 04/16/2017    Procedures since last visit: No results found.  Assessment/Plan HTN (hypertension) Sbp in 150-160s In am, normalizes in pm, Dbp <90 in am, but sometimes in 27s. The patient is asymptomatic. For better blood  pressures control, will change Atenolol 12.57m bid/262mdaily. Monitor Bps.   PSVT (paroxysmal supraventricular tachycardia) (HCC) Hx of PACs, continue Atenolol.   OVERACTIVE BLADDER Urinary leakage sometimes, doing Kegel's exercise, may consider Urology consultation if worsens.   Anemia Hgb 12.6 10/10/17, continue Vit B12, Folate, Iron po, CBC in 2 months.   Depression with anxiety Her mood is stable, eats and sleeps well, continue Cymbalta 2056mo daily.   Hyponatremia Serum Na is 134 10/10/17, continue to monitor BMP in 2 months.     Labs/tests ordered:  CBC BMP prior to the next appointment  Next appt:  2 months.   Time spend 25 minutes.

## 2017-10-10 NOTE — Assessment & Plan Note (Signed)
Serum Na is 134 10/10/17, continue to monitor BMP in 2 months.

## 2017-10-10 NOTE — Assessment & Plan Note (Signed)
Hgb 12.6 10/10/17, continue Vit B12, Folate, Iron po, CBC in 2 months.

## 2017-10-10 NOTE — Patient Instructions (Signed)
CBC BMP prior to the next appointment. Next appt:  2 months.

## 2017-10-10 NOTE — Assessment & Plan Note (Signed)
Her mood is stable, eats and sleeps well, continue Cymbalta 78m po daily.

## 2017-10-10 NOTE — Assessment & Plan Note (Signed)
Sbp in 150-160s In am, normalizes in pm, Dbp <90 in am, but sometimes in 48s. The patient is asymptomatic. For better blood pressures control, will change Atenolol 12.80m bid/295mdaily. Monitor Bps.

## 2017-10-10 NOTE — Assessment & Plan Note (Signed)
Urinary leakage sometimes, doing Kegel's exercise, may consider Urology consultation if worsens.

## 2017-10-10 NOTE — Assessment & Plan Note (Signed)
Hx of PACs, continue Atenolol.

## 2017-10-11 MED ORDER — ATENOLOL 25 MG PO TABS: 12.5000 mg | ORAL_TABLET | Freq: Two times a day (BID) | ORAL | 2 refills | Status: DC

## 2017-10-28 DIAGNOSIS — H353131 Nonexudative age-related macular degeneration, bilateral, early dry stage: Secondary | ICD-10-CM | POA: Diagnosis not present

## 2017-10-28 DIAGNOSIS — H401131 Primary open-angle glaucoma, bilateral, mild stage: Secondary | ICD-10-CM | POA: Diagnosis not present

## 2017-10-28 DIAGNOSIS — H04123 Dry eye syndrome of bilateral lacrimal glands: Secondary | ICD-10-CM | POA: Diagnosis not present

## 2017-11-03 ENCOUNTER — Other Ambulatory Visit: Payer: Self-pay

## 2017-11-03 ENCOUNTER — Inpatient Hospital Stay (HOSPITAL_COMMUNITY)
Admission: EM | Admit: 2017-11-03 | Discharge: 2017-11-15 | DRG: 378 | Disposition: A | Discharge status: Skilled Nursing Facility | Payer: Medicare HMO | Attending: Family Medicine | Admitting: Family Medicine

## 2017-11-03 ENCOUNTER — Encounter (HOSPITAL_COMMUNITY): Payer: Self-pay | Admitting: Emergency Medicine

## 2017-11-03 ENCOUNTER — Inpatient Hospital Stay (HOSPITAL_COMMUNITY): Payer: Medicare HMO

## 2017-11-03 DIAGNOSIS — K625 Hemorrhage of anus and rectum: Secondary | ICD-10-CM | POA: Diagnosis not present

## 2017-11-03 DIAGNOSIS — K7581 Nonalcoholic steatohepatitis (NASH): Secondary | ICD-10-CM | POA: Diagnosis not present

## 2017-11-03 DIAGNOSIS — K5909 Other constipation: Secondary | ICD-10-CM | POA: Diagnosis not present

## 2017-11-03 DIAGNOSIS — Z9841 Cataract extraction status, right eye: Secondary | ICD-10-CM | POA: Diagnosis not present

## 2017-11-03 DIAGNOSIS — K5731 Diverticulosis of large intestine without perforation or abscess with bleeding: Secondary | ICD-10-CM

## 2017-11-03 DIAGNOSIS — N3946 Mixed incontinence: Secondary | ICD-10-CM | POA: Diagnosis not present

## 2017-11-03 DIAGNOSIS — D649 Anemia, unspecified: Secondary | ICD-10-CM | POA: Diagnosis not present

## 2017-11-03 DIAGNOSIS — D62 Acute posthemorrhagic anemia: Secondary | ICD-10-CM | POA: Diagnosis not present

## 2017-11-03 DIAGNOSIS — Z8249 Family history of ischemic heart disease and other diseases of the circulatory system: Secondary | ICD-10-CM

## 2017-11-03 DIAGNOSIS — K640 First degree hemorrhoids: Secondary | ICD-10-CM | POA: Diagnosis present

## 2017-11-03 DIAGNOSIS — M419 Scoliosis, unspecified: Secondary | ICD-10-CM | POA: Diagnosis not present

## 2017-11-03 DIAGNOSIS — R21 Rash and other nonspecific skin eruption: Secondary | ICD-10-CM | POA: Diagnosis present

## 2017-11-03 DIAGNOSIS — R54 Age-related physical debility: Secondary | ICD-10-CM | POA: Diagnosis present

## 2017-11-03 DIAGNOSIS — Z888 Allergy status to other drugs, medicaments and biological substances status: Secondary | ICD-10-CM

## 2017-11-03 DIAGNOSIS — E119 Type 2 diabetes mellitus without complications: Secondary | ICD-10-CM | POA: Diagnosis present

## 2017-11-03 DIAGNOSIS — Z79899 Other long term (current) drug therapy: Secondary | ICD-10-CM

## 2017-11-03 DIAGNOSIS — K449 Diaphragmatic hernia without obstruction or gangrene: Secondary | ICD-10-CM | POA: Diagnosis not present

## 2017-11-03 DIAGNOSIS — K5720 Diverticulitis of large intestine with perforation and abscess without bleeding: Secondary | ICD-10-CM | POA: Diagnosis not present

## 2017-11-03 DIAGNOSIS — Z9842 Cataract extraction status, left eye: Secondary | ICD-10-CM | POA: Diagnosis not present

## 2017-11-03 DIAGNOSIS — Z90710 Acquired absence of both cervix and uterus: Secondary | ICD-10-CM

## 2017-11-03 DIAGNOSIS — Z886 Allergy status to analgesic agent status: Secondary | ICD-10-CM

## 2017-11-03 DIAGNOSIS — R42 Dizziness and giddiness: Secondary | ICD-10-CM | POA: Diagnosis not present

## 2017-11-03 DIAGNOSIS — N3281 Overactive bladder: Secondary | ICD-10-CM | POA: Diagnosis present

## 2017-11-03 DIAGNOSIS — E785 Hyperlipidemia, unspecified: Secondary | ICD-10-CM | POA: Diagnosis present

## 2017-11-03 DIAGNOSIS — Z85828 Personal history of other malignant neoplasm of skin: Secondary | ICD-10-CM

## 2017-11-03 DIAGNOSIS — Z85118 Personal history of other malignant neoplasm of bronchus and lung: Secondary | ICD-10-CM

## 2017-11-03 DIAGNOSIS — K921 Melena: Secondary | ICD-10-CM | POA: Diagnosis not present

## 2017-11-03 DIAGNOSIS — F329 Major depressive disorder, single episode, unspecified: Secondary | ICD-10-CM | POA: Diagnosis present

## 2017-11-03 DIAGNOSIS — R9431 Abnormal electrocardiogram [ECG] [EKG]: Secondary | ICD-10-CM | POA: Diagnosis not present

## 2017-11-03 DIAGNOSIS — M810 Age-related osteoporosis without current pathological fracture: Secondary | ICD-10-CM | POA: Diagnosis present

## 2017-11-03 DIAGNOSIS — Z9049 Acquired absence of other specified parts of digestive tract: Secondary | ICD-10-CM | POA: Diagnosis not present

## 2017-11-03 DIAGNOSIS — I471 Supraventricular tachycardia: Secondary | ICD-10-CM | POA: Diagnosis present

## 2017-11-03 DIAGNOSIS — K922 Gastrointestinal hemorrhage, unspecified: Secondary | ICD-10-CM | POA: Diagnosis not present

## 2017-11-03 DIAGNOSIS — Z853 Personal history of malignant neoplasm of breast: Secondary | ICD-10-CM

## 2017-11-03 DIAGNOSIS — Z961 Presence of intraocular lens: Secondary | ICD-10-CM | POA: Diagnosis present

## 2017-11-03 DIAGNOSIS — H409 Unspecified glaucoma: Secondary | ICD-10-CM | POA: Diagnosis present

## 2017-11-03 DIAGNOSIS — R1311 Dysphagia, oral phase: Secondary | ICD-10-CM | POA: Diagnosis not present

## 2017-11-03 DIAGNOSIS — K648 Other hemorrhoids: Secondary | ICD-10-CM | POA: Diagnosis not present

## 2017-11-03 DIAGNOSIS — Z8262 Family history of osteoporosis: Secondary | ICD-10-CM

## 2017-11-03 DIAGNOSIS — R5383 Other fatigue: Secondary | ICD-10-CM | POA: Diagnosis not present

## 2017-11-03 DIAGNOSIS — M6281 Muscle weakness (generalized): Secondary | ICD-10-CM | POA: Diagnosis not present

## 2017-11-03 DIAGNOSIS — F419 Anxiety disorder, unspecified: Secondary | ICD-10-CM | POA: Diagnosis present

## 2017-11-03 DIAGNOSIS — Z96643 Presence of artificial hip joint, bilateral: Secondary | ICD-10-CM | POA: Diagnosis present

## 2017-11-03 DIAGNOSIS — K219 Gastro-esophageal reflux disease without esophagitis: Secondary | ICD-10-CM | POA: Diagnosis present

## 2017-11-03 DIAGNOSIS — Z7982 Long term (current) use of aspirin: Secondary | ICD-10-CM

## 2017-11-03 DIAGNOSIS — Z923 Personal history of irradiation: Secondary | ICD-10-CM

## 2017-11-03 DIAGNOSIS — Z9109 Other allergy status, other than to drugs and biological substances: Secondary | ICD-10-CM

## 2017-11-03 DIAGNOSIS — K759 Inflammatory liver disease, unspecified: Secondary | ICD-10-CM | POA: Diagnosis not present

## 2017-11-03 HISTORY — DX: Other complications of anesthesia, initial encounter: T88.59XA

## 2017-11-03 HISTORY — DX: Adverse effect of unspecified anesthetic, initial encounter: T41.45XA

## 2017-11-03 LAB — CBC
HCT: 31.6 % — ABNORMAL LOW (ref 36.0–46.0)
HEMOGLOBIN: 10.3 g/dL — AB (ref 12.0–15.0)
MCH: 31.1 pg (ref 26.0–34.0)
MCHC: 32.6 g/dL (ref 30.0–36.0)
MCV: 95.5 fL (ref 78.0–100.0)
Platelets: 213 10*3/uL (ref 150–400)
RBC: 3.31 MIL/uL — AB (ref 3.87–5.11)
RDW: 15 % (ref 11.5–15.5)
WBC: 14.1 10*3/uL — ABNORMAL HIGH (ref 4.0–10.5)

## 2017-11-03 LAB — I-STAT CHEM 8, ED
BUN: 23 mg/dL — ABNORMAL HIGH (ref 6–20)
BUN: 26 mg/dL — ABNORMAL HIGH (ref 6–20)
CREATININE: 0.9 mg/dL (ref 0.44–1.00)
Calcium, Ion: 1.22 mmol/L (ref 1.15–1.40)
Calcium, Ion: 1.2 mmol/L (ref 1.15–1.40)
Chloride: 101 mmol/L (ref 101–111)
Chloride: 103 mmol/L (ref 101–111)
Creatinine, Ser: 0.9 mg/dL (ref 0.44–1.00)
GLUCOSE: 102 mg/dL — AB (ref 65–99)
GLUCOSE: 146 mg/dL — AB (ref 65–99)
HEMATOCRIT: 31 % — AB (ref 36.0–46.0)
HEMATOCRIT: 30 % — AB (ref 36.0–46.0)
HEMOGLOBIN: 10.5 g/dL — AB (ref 12.0–15.0)
HEMOGLOBIN: 10.2 g/dL — AB (ref 12.0–15.0)
POTASSIUM: 3.7 mmol/L (ref 3.5–5.1)
Potassium: 3.6 mmol/L (ref 3.5–5.1)
SODIUM: 137 mmol/L (ref 135–145)
Sodium: 138 mmol/L (ref 135–145)
TCO2: 25 mmol/L (ref 22–32)
TCO2: 24 mmol/L (ref 22–32)

## 2017-11-03 LAB — COMPREHENSIVE METABOLIC PANEL
ALK PHOS: 78 U/L (ref 38–126)
ALT: 13 U/L — ABNORMAL LOW (ref 14–54)
ANION GAP: 8 (ref 5–15)
AST: 20 U/L (ref 15–41)
Albumin: 3.1 g/dL — ABNORMAL LOW (ref 3.5–5.0)
BUN: 23 mg/dL — ABNORMAL HIGH (ref 6–20)
CALCIUM: 9.2 mg/dL (ref 8.9–10.3)
CHLORIDE: 103 mmol/L (ref 101–111)
CO2: 25 mmol/L (ref 22–32)
Creatinine, Ser: 0.89 mg/dL (ref 0.44–1.00)
GFR calc non Af Amer: 54 mL/min — ABNORMAL LOW (ref >60)
Glucose, Bld: 103 mg/dL — ABNORMAL HIGH (ref 65–99)
Potassium: 3.7 mmol/L (ref 3.5–5.1)
Sodium: 136 mmol/L (ref 135–145)
Total Bilirubin: 0.4 mg/dL (ref 0.3–1.2)
Total Protein: 6.5 g/dL (ref 6.5–8.1)

## 2017-11-03 LAB — PROTIME-INR
INR: 1
PROTHROMBIN TIME: 13.1 s (ref 11.4–15.2)

## 2017-11-03 LAB — POC OCCULT BLOOD, ED: FECAL OCCULT BLD: POSITIVE — AB

## 2017-11-03 MED ORDER — DULOXETINE HCL 20 MG PO CPEP
20.0000 mg | ORAL_CAPSULE | Freq: Every day | ORAL | Status: DC
  Administered 2017-11-04 – 2017-11-15 (×11): 20 mg via ORAL
  Filled 2017-11-03 (×12): qty 1

## 2017-11-03 MED ORDER — ACETAMINOPHEN 650 MG RE SUPP
650.0000 mg | Freq: Four times a day (QID) | RECTAL | Status: DC | PRN
Start: 2017-11-03 — End: 2017-11-15

## 2017-11-03 MED ORDER — SODIUM CHLORIDE 0.9 % IV BOLUS (SEPSIS)
1000.0000 mL | Freq: Once | INTRAVENOUS | Status: AC
  Administered 2017-11-03: 1000 mL via INTRAVENOUS

## 2017-11-03 MED ORDER — DORZOLAMIDE HCL-TIMOLOL MAL 2-0.5 % OP SOLN
1.0000 [drp] | Freq: Two times a day (BID) | OPHTHALMIC | Status: DC
  Administered 2017-11-03 – 2017-11-15 (×22): 1 [drp] via OPHTHALMIC
  Filled 2017-11-03: qty 10

## 2017-11-03 MED ORDER — ONDANSETRON HCL 4 MG PO TABS: 4.0000 mg | ORAL_TABLET | Freq: Four times a day (QID) | ORAL | Status: DC | PRN

## 2017-11-03 MED ORDER — LORATADINE 10 MG PO TABS: 10.0000 mg | ORAL_TABLET | Freq: Every day | ORAL | Status: DC

## 2017-11-03 MED ORDER — VITAMIN B-12 1000 MCG PO TABS
1000.0000 ug | ORAL_TABLET | Freq: Every day | ORAL | Status: DC
  Administered 2017-11-04 – 2017-11-15 (×11): 1000 ug via ORAL
  Filled 2017-11-03 (×11): qty 1

## 2017-11-03 MED ORDER — FOLIC ACID 1 MG PO TABS
500.0000 ug | ORAL_TABLET | Freq: Every day | ORAL | Status: DC
  Administered 2017-11-04 – 2017-11-15 (×11): 0.5 mg via ORAL
  Filled 2017-11-03 (×11): qty 1

## 2017-11-03 MED ORDER — ACETAMINOPHEN 325 MG PO TABS
650.0000 mg | ORAL_TABLET | Freq: Four times a day (QID) | ORAL | Status: DC | PRN
  Administered 2017-11-06 – 2017-11-14 (×6): 650 mg via ORAL
  Filled 2017-11-03 (×6): qty 2

## 2017-11-03 MED ORDER — PANTOPRAZOLE SODIUM 40 MG PO TBEC
40.0000 mg | DELAYED_RELEASE_TABLET | Freq: Every day | ORAL | Status: DC
  Administered 2017-11-03 – 2017-11-04 (×2): 40 mg via ORAL
  Filled 2017-11-03 (×2): qty 1

## 2017-11-03 MED ORDER — SODIUM CHLORIDE 0.9 % IV SOLN
Freq: Once | INTRAVENOUS | Status: AC
Start: 2017-11-03 — End: 2017-11-03
  Administered 2017-11-03: 21:00:00 via INTRAVENOUS

## 2017-11-03 MED ORDER — ONDANSETRON HCL 4 MG/2ML IJ SOLN
4.0000 mg | Freq: Four times a day (QID) | INTRAMUSCULAR | Status: DC | PRN
  Administered 2017-11-08: 4 mg via INTRAVENOUS

## 2017-11-03 MED ORDER — LORATADINE 10 MG PO TABS
10.0000 mg | ORAL_TABLET | Freq: Every day | ORAL | Status: DC
  Administered 2017-11-04 – 2017-11-15 (×8): 10 mg via ORAL
  Filled 2017-11-03 (×10): qty 1

## 2017-11-03 MED ORDER — LATANOPROST 0.005 % OP SOLN
1.0000 [drp] | Freq: Every day | OPHTHALMIC | Status: DC
  Administered 2017-11-03 – 2017-11-14 (×12): 1 [drp] via OPHTHALMIC
  Filled 2017-11-03: qty 2.5

## 2017-11-03 NOTE — ED Provider Notes (Signed)
[facility] DEPT Provider Note   CSN: [id number] Arrival date & time: 11/03/17  1706     History   Chief Complaint Chief Complaint  Patient presents with  . GI Bleeding    HPI [name] is a 83 y.o. female.    The history is provided by the patient and medical records.  Rectal Bleeding  Quality:  Bright red Amount:  Copious Duration:  3 hours Timing:  Intermittent Chronicity:  Recurrent Context comment:  Hx of diverticular bleed Similar prior episodes: yes   Relieved by:  Nothing Worsened by:  Nothing Ineffective treatments:  None tried Associated symptoms: light-headedness   Associated symptoms: no abdominal pain, no dizziness, no fever, no hematemesis, no loss of consciousness, no recent illness and no vomiting   Risk factors: no anticoagulant use     Past Medical History:  Diagnosis Date  . Allergic rhinitis   . Anemia    NOS  . Anxiety   . Biceps tendon rupture    Bilateral  . Breast cancer ([place]) 2001   Left s/p lumpectomy and XRT   . Bronchiectasis ([place]) 2014   Noted on chest x-ray  . Cough 2014  . Depression   . Diverticulosis   . Elevated liver enzymes    Biopsy consistent with steatohepatitis  . Fundic gland polyps of stomach, benign   . GERD (gastroesophageal reflux disease) 06/2007   EGD Dr Gala Romney >sm HH, multiple fundic gland polyps  . Glaucoma 2013   both eyes  . Hemorrhoids   . Hiatal hernia   . Hx of radiation therapy 07/17/10 to 07/21/10   SRS LLL lung  . Hyperlipidemia   . Insomnia   . Low back pain    scoliosis  . Lung cancer ([place]) 05/2010   Left 2011 - rad x 3  . Lymphedema    Left arm  . Osteoarthritis   . Osteopenia 10/25/2016  . Osteoporosis, senile   . Overactive bladder   . Pneumonia    RLL with sepsis  . PSVT (paroxysmal supraventricular tachycardia) ([place])   . Rheumatic fever    Age 12  . Right thyroid nodule   . Steatohepatitis    liver bx  . Type 2 diabetes mellitus [facility])      Patient Active Problem List   Diagnosis Date Noted  . Decrease in appetite 04/03/2017  . Generalized weakness 04/03/2017  . Otalgia 10/25/2016  . Osteopenia 10/25/2016  . Hyponatremia 07/09/2015  . Hearing loss of both ears 04/08/2014  . HTN (hypertension) 03/27/2014  . Constipation 03/27/2014  . Skin lesion of face 03/27/2014  . Bronchiectasis ([place])   . Cough   . Rotator cuff syndrome of right shoulder 09/30/2012  . Breast cancer ([place])   . Hx of radiation therapy   . Lung cancer, lower lobe ([place]) 10/25/2011  . PSVT (paroxysmal supraventricular tachycardia) ([place]) 03/29/2011  . HIP PAIN 05/03/2010  . TOTAL HIP FOLLOW-UP 05/03/2010  . INTERDIGITAL NEUROMA 03/28/2010  . IMPINGEMENT SYNDROME 03/28/2010  . THYROID NODULE, RIGHT 09/06/2008  . HIP, ARTHRITIS, DEGEN./OSTEO 05/03/2008  . PALPITATIONS, RECURRENT 08/27/2007  . Depression with anxiety 07/31/2007  . WEAKNESS, MUSCLE 05/28/2007  . Other specified disorders of adrenal gland ([place]) 05/14/2007  . FATIGUE 05/01/2007  . Type 2 diabetes mellitus with hyperglycemia ([place]) 01/28/2007  . Hyperlipidemia 01/28/2007  . Anemia 01/28/2007  . Allergic rhinitis 01/28/2007  . GERD 01/28/2007  . OVERACTIVE BLADDER 01/28/2007  . Osteoarthritis 01/28/2007  . LOW BACK PAIN  01/28/2007  . Osteoporosis 01/28/2007    Past Surgical History:  Procedure Laterality Date  . ABDOMINAL HYSTERECTOMY  1964  . APPENDECTOMY  1964  . BIOPSY THYROID  11/2008  . BREAST BIOPSY     X 3 w/ cystectomy  . BREAST LUMPECTOMY  2001  . CATARACT EXTRACTION Bilateral 2003   Lens implant Dr. Charise Killian  . CHOLECYSTECTOMY  1965  . COLONOSCOPY  2005 /2012   Dr. Lucianne Muss- diverticulosis, ext hemorrhoids.  . COLONOSCOPY N/A 09/11/2011   Performed by Danie Binder, MD at [place]  . CYSTOSCOPY  2007  . ESOPHAGOGASTRODUODENOSCOPY (EGD) N/A 09/11/2011   Performed by Danie Binder, MD at [facility]  2008  . LUNG BIOPSY Left 06/06/2010  .  REVISION TOTAL HIP ARTHROPLASTY  2007   Left  . SKIN CANCER EXCISION     nose and left lower cheek  . TONSILLECTOMY  1930  . TOTAL HIP ARTHROPLASTY Bilateral 2005 & 2007    Dr. Earl Lagos. Aline Brochure     OB History    No data available       Home Medications    Prior to Admission medications   Medication Sig Start Date End Date Taking? Authorizing Provider  acetaminophen (TYLENOL) 325 MG tablet Take 650 mg by mouth. Take 2 tablets every 6 hours as needed    [provider]  aspirin 81 MG EC tablet Take 81 mg by mouth daily.      [provider]  atenolol (TENORMIN) 25 MG tablet Take 0.5 tablets (12.5 mg total) by mouth 2 (two) times daily. 10/11/17   Mast, Man X, NP  brimonidine (ALPHAGAN) 0.2 % ophthalmic solution Place 1 drop into both eyes 3 (three) times daily. 10/31/16   [provider]  cetirizine (ZYRTEC) 10 MG tablet Take 10 mg by mouth daily.    [provider]  Dextromethorphan-Guaifenesin (ROBAFEN DM) 10-100 MG/5ML liquid Take 5 mLs by mouth. 10 ml by mouth every 6 hours as need for cough in 48 hours    [provider]  dorzolamide-timolol (COSOPT) 22.3-6.8 MG/ML ophthalmic solution 1 drop. One drop in each eye twice daily    [provider]  DULoxetine (CYMBALTA) 20 MG capsule TAKE (1) CAPSULE DAILY. 09/04/17   Mast, Man X, NP  Ferrous Gluconate (IRON 27 PO) Take 1 tablet by mouth daily.    [provider]  folic acid (FOLVITE) 353 MCG tablet Take 400 mcg by mouth daily.    [provider]  lactose free nutrition (BOOST) LIQD Take 237 mLs by mouth. Serve in cup over ice once a day between meals    [provider]  latanoprost (XALATAN) 0.005 % ophthalmic solution Place 1 drop into both eyes at bedtime.    [provider]  Multiple Vitamins-Minerals (PRESERVISION AREDS 2 PO) Take by mouth. Take one capsule twice a day for vision    [provider]  omeprazole (PRILOSEC) 20 MG  capsule TAKE 1 CAPSULE EVERY DAY. 07/05/17   Blanchie Serve, MD  vitamin B-12 (CYANOCOBALAMIN) 1000 MCG tablet Take 1,000 mcg by mouth daily.    [provider]    Family History Family History  Problem Relation Age of Onset  . Heart failure Mother   . Osteoporosis Mother   . Hypertension Father   . Stroke Father   . Heart failure Father   . Leukemia Brother   . Heart failure Brother   . Cancer Maternal Grandmother   .  Stroke Maternal Grandfather   . Cancer Unknown     Social History Social History   Tobacco Use  . Smoking status: Never Smoker  . Smokeless tobacco: Never Used  . Tobacco comment: 2nd hand-husband  Substance Use Topics  . Alcohol use: No  . Drug use: No     Allergies   Ibuprofen; Naproxen; Statins; Surgilube [gyne-moistrin]; and Tape   Review of Systems Review of Systems  Constitutional: Positive for fatigue. Negative for chills, diaphoresis and fever.  HENT: Negative for congestion and rhinorrhea.   Eyes: Negative for photophobia and visual disturbance.  Respiratory: Negative for cough, chest tightness, shortness of breath and wheezing.   Cardiovascular: Negative for chest pain and palpitations.  Gastrointestinal: Positive for anal bleeding, blood in stool and hematochezia. Negative for abdominal distention, abdominal pain, diarrhea, hematemesis, nausea, rectal pain and vomiting.  Genitourinary: Negative for dysuria and flank pain.  Musculoskeletal: Negative for back pain, neck pain and neck stiffness.  Skin: Negative for wound.  Neurological: Positive for light-headedness. Negative for dizziness, loss of consciousness and numbness.  All other systems reviewed and are negative.    Physical Exam Updated Vital Signs SpO2 97%   Physical Exam  Constitutional: She is oriented to person, place, and time. She appears well-developed and well-nourished. No distress.  HENT:  Head: Normocephalic and atraumatic.  Nose: Nose normal.   Mouth/Throat: Oropharynx is clear and moist. No oropharyngeal exudate.  Eyes: Conjunctivae and EOM are normal. Pupils are equal, round, and reactive to light.  Neck: Normal range of motion.  Cardiovascular: Normal rate and intact distal pulses.  No murmur heard. Pulmonary/Chest: Effort normal. No stridor. She has no wheezes. She exhibits no tenderness.  Abdominal: Soft. There is no tenderness. There is no rebound and no guarding.  Genitourinary: Rectal exam shows guaiac positive stool. Rectal exam shows no tenderness.  Genitourinary Comments: Gross blood in rectum.   Musculoskeletal: She exhibits no edema or tenderness.  Neurological: She is alert and oriented to person, place, and time. No sensory deficit. She exhibits normal muscle tone.  Skin: No rash noted. She is not diaphoretic. No erythema. There is pallor.  Psychiatric: She has a normal mood and affect.  Nursing note and vitals reviewed.    ED Treatments / Results  Labs (all labs ordered are listed, but only abnormal results are displayed) Labs Reviewed  COMPREHENSIVE METABOLIC PANEL - Abnormal; Notable for the following components:      Result Value   Glucose, Bld 103 (*)    BUN 23 (*)    Albumin 3.1 (*)    ALT 13 (*)    GFR calc non Af Amer 54 (*)    All other components within normal limits  CBC - Abnormal; Notable for the following components:   WBC 14.1 (*)    RBC 3.31 (*)    Hemoglobin 10.3 (*)    HCT 31.6 (*)    All other components within normal limits  HEMOGLOBIN AND HEMATOCRIT, BLOOD - Abnormal; Notable for the following components:   Hemoglobin 7.4 (*)    HCT 22.3 (*)    All other components within normal limits  HEMOGLOBIN AND HEMATOCRIT, BLOOD - Abnormal; Notable for the following components:   Hemoglobin 9.8 (*)    HCT 29.3 (*)    All other components within normal limits  GLUCOSE, CAPILLARY - Abnormal; Notable for the following components:   Glucose-Capillary 104 (*)    All other components within  normal limits  POC OCCULT BLOOD, ED -  Abnormal; Notable for the following components:   Fecal Occult Bld POSITIVE (*)    All other components within normal limits  I-STAT CHEM 8, ED - Abnormal; Notable for the following components:   BUN 23 (*)    Glucose, Bld 102 (*)    Hemoglobin 10.5 (*)    HCT 31.0 (*)    All other components within normal limits  I-STAT CHEM 8, ED - Abnormal; Notable for the following components:   BUN 26 (*)    Glucose, Bld 146 (*)    Hemoglobin 10.2 (*)    HCT 30.0 (*)    All other components within normal limits  MRSA PCR SCREENING  PROTIME-INR  HEMOGLOBIN AND HEMATOCRIT, BLOOD  HEMOGLOBIN AND HEMATOCRIT, BLOOD  TYPE AND SCREEN  PREPARE RBC (CROSSMATCH)  ABO/RH  PREPARE RBC (CROSSMATCH)    EKG  EKG Interpretation  Date/Time:  Sunday November 03 2017 17:55:45 EST Ventricular Rate:  80 PR Interval:    QRS Duration: 96 QT Interval:  414 QTC Calculation: 478 R Axis:   77 Text Interpretation:  Sinus rhythm Supraventricular bigeminy Borderline short PR interval When compared to prior, no significant changes seen,.  no STEMI Confirmed by Antony Blackbird [phone]) on 11/03/2017 8:10:04 PM Also confirmed by Antony Blackbird [phone]), editor Laurena Spies [phone])  on 11/04/2017 8:27:15 AM       Radiology Nm Gi Blood Loss  Result Date: 11/04/2017 CLINICAL DATA:  83 year old female with bright red blood per rectum. EXAM: NUCLEAR MEDICINE GASTROINTESTINAL BLEEDING SCAN TECHNIQUE: Sequential abdominal images were obtained following intravenous administration of Tc-68mlabeled red blood cells. RADIOPHARMACEUTICALS:  27.2 mCi Tc-948mn-vitro labeled red cells. COMPARISON:  PET-CT dated 01/30/2010 FINDINGS: There is physiologic uptake in the liver, spleen as well as physiologic activity in the aorta, IVC, and iliac vasculature. There is physiologic uptake and excretion of radiotracer by the kidneys and in the bladder. No abnormal activity identified conforming to  the bowel to suggest active GI bleed. IMPRESSION: No evidence of active GI bleed. Electronically Signed   By: ArAnner Crete.D.   On: 11/04/2017 02:13    Procedures Procedures (including critical care time)  CRITICAL CARE Performed by: ChGwenyth Allegraegeler Total critical care time: 45 minutes Critical care time was exclusive of separately billable procedures and treating other patients. Critical care was necessary to treat or prevent imminent or life-threatening deterioration. Active GI bleed requiring blood transfusions.  Critical care was time spent personally by me on the following activities: development of treatment plan with patient and/or surrogate as well as nursing, discussions with consultants, evaluation of patient's response to treatment, examination of patient, obtaining history from patient or surrogate, ordering and performing treatments and interventions, ordering and review of laboratory studies, ordering and review of radiographic studies, pulse oximetry and re-evaluation of patient's condition.   Medications Ordered in ED Medications  acetaminophen (TYLENOL) tablet 650 mg (not administered)    Or  acetaminophen (TYLENOL) suppository 650 mg (not administered)  ondansetron (ZOFRAN) tablet 4 mg (not administered)    Or  ondansetron (ZOFRAN) injection 4 mg (not administered)  latanoprost (XALATAN) 0.005 % ophthalmic solution 1 drop (1 drop Both Eyes Given [id number] folic acid (FOLVITE) tablet 0.5 mg (0.5 mg Oral Given 11/04/17 1032)  DULoxetine (CYMBALTA) DR capsule 20 mg (20 mg Oral Given 11/04/17 1033)  vitamin B-12 (CYANOCOBALAMIN) tablet 1,000 mcg (1,000 mcg Oral Given 11/04/17 1033)  dorzolamide-timolol (COSOPT) 22.3-6.8 MG/ML ophthalmic solution 1 drop (1 drop Both Eyes Given 11/04/17 1034)  loratadine (CLARITIN) tablet 10 mg (10 mg Oral Given 11/04/17 1033)  0.9 %  sodium chloride infusion (not administered)  dextrose 5 % and 0.9 % NaCl with KCl 20 mEq/L  infusion (not administered)  pantoprazole (PROTONIX) injection 40 mg (not administered)  sodium chloride 0.9 % bolus 1,000 mL (1,000 mLs Intravenous New Bag/Given 11/03/17 2111)  0.9 %  sodium chloride infusion ( Intravenous New Bag/Given 11/03/17 2112)  technetium labeled red blood cells (ULTRATAG) injection kit 68.2 millicurie (57.4 millicuries Intravenous Contrast Given 11/03/17 2348)     Initial Impression / Assessment and Plan / ED Course  I have reviewed the triage vital signs and the nursing notes.  Pertinent labs & imaging results that were available during my care of the patient were reviewed by me and considered in my medical decision making (see chart for details).     [name] is a very pleasant 83 y.o. female significant for diabetes and prior diverticular bleed requiring clip placement who presents for rectal bleeding.  That she was watching the [employer] football team play several hours ago when she had the urge to go to the restroom.  Patient reports that during a bowel movement, she had large rectal bleeding.  She described the blood as bright.  She reports 3 episodes of rectal bleeding at home prompting her to come to the emergency department.  She reports that the blood "pouring out".  On arrival, patient had several episodes of bright rectal bleeding.  Patient initially denied lightheadedness or syncope but began feeling more lightheaded during her ED stay.  Patient also became more pale.  Patient denied any preceding symptoms such as fevers, chills, chest pain, palpitations, shortness of breath, nausea, vomiting, or abdominal pain.  On my initial exam, abdomen is nontender.  Gross blood in patient's rectum.  Back was nontender.  Lungs clear.  Laboratory testing was obtained showing hemoglobin of 10.3.  Repeat hemoglobin had only decreased to 10.2 however, patient was continuing to have active rectal bleeding.  When patient was asked to stand up, she felt very  lightheaded and nearly syncopized.  I am concerned patient is having symptomatic anemia.  GI was called and they recommended blood administration and admission to hospitalist service.  They are concerned she may have a repeat diverticular bleed.  They recommended a tagged red blood cell scan and possible IR intervention if positive.  Patient was also given fluids.  Patient admitted to hospitalist service for further management of active GI bleeding requiring blood transfusions.    Final Clinical Impressions(s) / ED Diagnoses   Final diagnoses:  Rectal bleeding  Lightheaded    Clinical Impression: 1. Rectal bleeding   2. Lightheaded     Disposition: Admit to hospitalist service    Travarus Trudo, Gwenyth Allegra, MD 11/04/17 1401

## 2017-11-03 NOTE — ED Notes (Signed)
Pt stated "I'm too weak to stand, I"m not going to stand" during orthostatic vitals>

## 2017-11-03 NOTE — ED Notes (Signed)
ED TO INPATIENT HANDOFF REPORT  Name/Age/Gender [name] 83 y.o. female  Code Status    Code Status Orders  (From admission, onward)        Start     Ordered   11/03/17 2152  Limited resuscitation (code)  Continuous    Question Answer Comment  In the event of cardiac or respiratory ARREST: Initiate Code Blue, Call Rapid Response Yes   In the event of cardiac or respiratory ARREST: Perform CPR No   In the event of cardiac or respiratory ARREST: Perform Intubation/Mechanical Ventilation Yes   In the event of cardiac or respiratory ARREST: Use NIPPV/BiPAp only if indicated Yes   In the event of cardiac or respiratory ARREST: Administer ACLS medications if indicated Yes   In the event of cardiac or respiratory ARREST: Perform Defibrillation or Cardioversion if indicated Yes      11/03/17 2152    Code Status History    Date Active Date Inactive Code Status Order ID Comments User Context   This patient has a current code status but no historical code status.      Home/SNF/Other Home  Chief Complaint GI Bleed   Level of Care/Admitting Diagnosis ED Disposition    ED Disposition Condition Comment   Admit  Hospital Area: [facility] [[id number]]  Level of Care: Stepdown [14]  Admit to SDU based on following criteria: Hemodynamic compromise or significant risk of instability:  Patient requiring short term acute titration and management of vasoactive drips, and invasive monitoring (i.e., CVP and Arterial line).  Diagnosis: Hematochezia [213086]  Admitting Physician: Doreatha Massed  Attending Physician: Etta Quill [phone]  Estimated length of stay: past midnight tomorrow  Certification:: I certify this patient will need inpatient services for at least 2 midnights  PT Class (Do Not Modify): Inpatient [101]  PT Acc Code (Do Not Modify): Private [1]       Medical History Past Medical History:  Diagnosis Date  . Allergic rhinitis   . Anemia     NOS  . Anxiety   . Biceps tendon rupture    Bilateral  . Breast cancer ([place]) 2001   Left s/p lumpectomy and XRT   . Bronchiectasis ([place]) 2014   Noted on chest x-ray  . Cough 2014  . Depression   . Diverticulosis   . Elevated liver enzymes    Biopsy consistent with steatohepatitis  . Fundic gland polyps of stomach, benign   . GERD (gastroesophageal reflux disease) 06/2007   EGD Dr Gala Romney >sm HH, multiple fundic gland polyps  . Glaucoma 2013   both eyes  . Hemorrhoids   . Hiatal hernia   . Hx of radiation therapy 07/17/10 to 07/21/10   SRS LLL lung  . Hyperlipidemia   . Insomnia   . Low back pain    scoliosis  . Lung cancer ([place]) 05/2010   Left 2011 - rad x 3  . Lymphedema    Left arm  . Osteoarthritis   . Osteopenia 10/25/2016  . Osteoporosis, senile   . Overactive bladder   . Pneumonia    RLL with sepsis  . PSVT (paroxysmal supraventricular tachycardia) ([place])   . Rheumatic fever    Age 70  . Right thyroid nodule   . Steatohepatitis    liver bx  . Type 2 diabetes mellitus ([facility])     Allergies Allergies  Allergen Reactions  . Ibuprofen Hives  . Naproxen   . Statins Other (See Comments)  Feels like knives in stomach  . Surgilube [Gyne-Moistrin]     Rectal itching, burning  . Tape Rash    IV Location/Drains/Wounds Patient Lines/Drains/Airways Status   Active Line/Drains/Airways    None          Labs/Imaging Results for orders placed or performed during the hospital encounter of 11/03/17 (from the past 48 hour(s))  POC occult blood, ED     Status: Abnormal   Collection Time: 11/03/17  6:06 PM  Result Value Ref Range   Fecal Occult Bld POSITIVE (A) NEGATIVE  Comprehensive metabolic panel     Status: Abnormal   Collection Time: 11/03/17  6:18 PM  Result Value Ref Range   Sodium 136 135 - 145 mmol/L   Potassium 3.7 3.5 - 5.1 mmol/L   Chloride 103 101 - 111 mmol/L   CO2 25 22 - 32 mmol/L   Glucose, Bld 103 (H) 65 - 99 mg/dL   BUN 23 (H) 6 - 20 mg/dL    Creatinine, Ser 0.89 0.44 - 1.00 mg/dL   Calcium 9.2 8.9 - 10.3 mg/dL   Total Protein 6.5 6.5 - 8.1 g/dL   Albumin 3.1 (L) 3.5 - 5.0 g/dL   AST 20 15 - 41 U/L   ALT 13 (L) 14 - 54 U/L   Alkaline Phosphatase 78 38 - 126 U/L   Total Bilirubin 0.4 0.3 - 1.2 mg/dL   GFR calc non Af Amer 54 (L) >60 mL/min   GFR calc Af Amer >60 >60 mL/min    Comment: (NOTE) The eGFR has been calculated using the CKD EPI equation. This calculation has not been validated in all clinical situations. eGFR's persistently <60 mL/min signify possible Chronic Kidney Disease.    Anion gap 8 5 - 15  CBC     Status: Abnormal   Collection Time: 11/03/17  6:18 PM  Result Value Ref Range   WBC 14.1 (H) 4.0 - 10.5 K/uL   RBC 3.31 (L) 3.87 - 5.11 MIL/uL   Hemoglobin 10.3 (L) 12.0 - 15.0 g/dL   HCT 31.6 (L) 36.0 - 46.0 %   MCV 95.5 78.0 - 100.0 fL   MCH 31.1 26.0 - 34.0 pg   MCHC 32.6 30.0 - 36.0 g/dL   RDW 15.0 11.5 - 15.5 %   Platelets 213 150 - 400 K/uL  Type and screen [facility]     Status: None   Collection Time: 11/03/17  6:18 PM  Result Value Ref Range   ABO/RH(D) B POS    Antibody Screen NEG    Sample Expiration 11/06/2017   Protime-INR     Status: None   Collection Time: 11/03/17  6:18 PM  Result Value Ref Range   Prothrombin Time 13.1 11.4 - 15.2 seconds   INR 1.00   I-Stat Chem 8, ED     Status: Abnormal   Collection Time: 11/03/17  6:29 PM  Result Value Ref Range   Sodium 138 135 - 145 mmol/L   Potassium 3.6 3.5 - 5.1 mmol/L   Chloride 101 101 - 111 mmol/L   BUN 23 (H) 6 - 20 mg/dL   Creatinine, Ser 0.90 0.44 - 1.00 mg/dL   Glucose, Bld 102 (H) 65 - 99 mg/dL   Calcium, Ion 1.22 1.15 - 1.40 mmol/L   TCO2 25 22 - 32 mmol/L   Hemoglobin 10.5 (L) 12.0 - 15.0 g/dL   HCT 31.0 (L) 36.0 - 46.0 %  I-Stat Chem 8, ED  Status: Abnormal   Collection Time: 11/03/17  8:39 PM  Result Value Ref Range   Sodium 137 135 - 145 mmol/L   Potassium 3.7 3.5 - 5.1 mmol/L   Chloride  103 101 - 111 mmol/L   BUN 26 (H) 6 - 20 mg/dL   Creatinine, Ser 0.90 0.44 - 1.00 mg/dL   Glucose, Bld 146 (H) 65 - 99 mg/dL   Calcium, Ion 1.20 1.15 - 1.40 mmol/L   TCO2 24 22 - 32 mmol/L   Hemoglobin 10.2 (L) 12.0 - 15.0 g/dL   HCT 30.0 (L) 36.0 - 46.0 %   No results found.  Pending Labs Unresulted Labs (From admission, onward)   Start     Ordered   11/04/17 0000  Hemoglobin and hematocrit, blood  Now then every 6 hours,   R     11/03/17 2105   11/03/17 2051  Prepare RBC  (Adult Blood Administration - Red Blood Cells)  Once,   R    Question Answer Comment  # of Units 2 units   Transfusion Indications Actively Bleeding / GI Bleed   If emergent release call blood bank [facility] [phone]      11/03/17 2052      Vitals/Pain Today's Vitals   11/03/17 1900 11/03/17 1915 11/03/17 1930 11/03/17 2157  BP: 113/65  121/75 129/64  Pulse: (!) 40 86 78 68  Resp: 20 19 (!) 23 17  SpO2: 96% 98% 96% 97%    Isolation Precautions No active isolations  Medications Medications  acetaminophen (TYLENOL) tablet 650 mg (not administered)    Or  acetaminophen (TYLENOL) suppository 650 mg (not administered)  ondansetron (ZOFRAN) tablet 4 mg (not administered)    Or  ondansetron (ZOFRAN) injection 4 mg (not administered)  latanoprost (XALATAN) 0.005 % ophthalmic solution 1 drop (not administered)  folic acid (FOLVITE) tablet 400 mcg (not administered)  DULoxetine (CYMBALTA) DR capsule 20 mg (not administered)  pantoprazole (PROTONIX) EC tablet 40 mg (not administered)  vitamin B-12 (CYANOCOBALAMIN) tablet 1,000 mcg (not administered)  dorzolamide-timolol (COSOPT) 22.3-6.8 MG/ML ophthalmic solution 1 drop (not administered)  loratadine (CLARITIN) tablet 10 mg (not administered)  sodium chloride 0.9 % bolus 1,000 mL (1,000 mLs Intravenous New Bag/Given 11/03/17 2111)  0.9 %  sodium chloride infusion ( Intravenous New Bag/Given 11/03/17 2112)    Mobility walks with person  assist

## 2017-11-03 NOTE — ED Notes (Signed)
Notified EDP,Tegeler,MD. Pt. I-stat Chem 8 results hemoglobin 10.2.

## 2017-11-03 NOTE — H&P (Signed)
History and Physical    [name] HAL:[id number] DOB: 10/04/1921 DOA: 11/03/2017  PCP: Blanchie Serve, MD  Patient coming from: Home  I have personally briefly reviewed patient's old medical records in [place]  Chief Complaint: BRBPR  HPI: [name] is a 83 y.o. female with medical history significant of diverticular bleed in 2012, stopped on its own but the diverticula got clipped on colonoscopy.  Patient presents to the ED with sudden onset, persistent, worsening BRBPR episodes.  3 episodes PTA.  Painless, nothing makes better or worse.  On ASA 81 daily no other blood thinners.   ED Course: Episodes persist in ED.  HGB 10.2.  2 units PRBC transfusion ordered by EDP, felt not to have equilibrated yet.  GI called and rec'd tagged RBC scan.   Review of Systems: As per HPI otherwise 10 point review of systems negative.   Past Medical History:  Diagnosis Date  . Allergic rhinitis   . Anemia    NOS  . Anxiety   . Biceps tendon rupture    Bilateral  . Breast cancer ([place]) 2001   Left s/p lumpectomy and XRT   . Bronchiectasis ([place]) 2014   Noted on chest x-ray  . Cough 2014  . Depression   . Diverticulosis   . Elevated liver enzymes    Biopsy consistent with steatohepatitis  . Fundic gland polyps of stomach, benign   . GERD (gastroesophageal reflux disease) 06/2007   EGD Dr Gala Romney >sm HH, multiple fundic gland polyps  . Glaucoma 2013   both eyes  . Hemorrhoids   . Hiatal hernia   . Hx of radiation therapy 07/17/10 to 07/21/10   SRS LLL lung  . Hyperlipidemia   . Insomnia   . Low back pain    scoliosis  . Lung cancer ([place]) 05/2010   Left 2011 - rad x 3  . Lymphedema    Left arm  . Osteoarthritis   . Osteopenia 10/25/2016  . Osteoporosis, senile   . Overactive bladder   . Pneumonia    RLL with sepsis  . PSVT (paroxysmal supraventricular tachycardia) ([place])   . Rheumatic fever    Age 58  . Right thyroid nodule   . Steatohepatitis    liver bx  . Type 2  diabetes mellitus ([place])     Past Surgical History:  Procedure Laterality Date  . ABDOMINAL HYSTERECTOMY  1964  . APPENDECTOMY  1964  . BIOPSY THYROID  11/2008  . BREAST BIOPSY     X 3 w/ cystectomy  . BREAST LUMPECTOMY  2001  . CATARACT EXTRACTION Bilateral 2003   Lens implant Dr. Charise Killian  . CHOLECYSTECTOMY  1965  . COLONOSCOPY  2005 /2012   Dr. Lucianne Muss- diverticulosis, ext hemorrhoids.  . COLONOSCOPY N/A 09/11/2011   Performed by Danie Binder, MD at [place]  . CYSTOSCOPY  2007  . ESOPHAGOGASTRODUODENOSCOPY (EGD) N/A 09/11/2011   Performed by Danie Binder, MD at [place]  2008  . LUNG BIOPSY Left 06/06/2010  . REVISION TOTAL HIP ARTHROPLASTY  2007   Left  . SKIN CANCER EXCISION     nose and left lower cheek  . TONSILLECTOMY  1930  . TOTAL HIP ARTHROPLASTY Bilateral 2005 & 2007    Dr. Earl Lagos. Aline Brochure      reports that  has never smoked. she has never used smokeless tobacco. She reports that she does not drink alcohol or use drugs.  Allergies  Allergen Reactions  . Ibuprofen Hives  . Naproxen   . Statins Other (See Comments)    Feels like knives in stomach  . Surgilube [Gyne-Moistrin]     Rectal itching, burning  . Tape Rash    Family History  Problem Relation Age of Onset  . Heart failure Mother   . Osteoporosis Mother   . Hypertension Father   . Stroke Father   . Heart failure Father   . Leukemia Brother   . Heart failure Brother   . Cancer Maternal Grandmother   . Stroke Maternal Grandfather   . Cancer Unknown      Prior to Admission medications   Medication Sig Start Date End Date Taking? Authorizing Provider  acetaminophen (TYLENOL) 325 MG tablet Take 650 mg by mouth. Take 2 tablets every 6 hours as needed   Yes [provider]  aspirin 81 MG EC tablet Take 81 mg by mouth daily.     Yes [provider]  atenolol (TENORMIN) 25 MG tablet Take 0.5 tablets (12.5 mg total) by mouth 2 (two) times daily.  10/11/17  Yes Mast, Man X, NP  cetirizine (ZYRTEC) 10 MG tablet Take 10 mg by mouth daily.   Yes [provider]  Dextromethorphan-Guaifenesin (ROBAFEN DM) 10-100 MG/5ML liquid Take 5 mLs by mouth. 10 ml by mouth every 6 hours as need for cough in 48 hours   Yes [provider]  dorzolamide-timolol (COSOPT) 22.3-6.8 MG/ML ophthalmic solution Place 1 drop 2 (two) times daily into both eyes. One drop in each eye twice daily    Yes [provider]  DULoxetine (CYMBALTA) 20 MG capsule TAKE (1) CAPSULE DAILY. 09/04/17  Yes Mast, Man X, NP  Ferrous Gluconate (IRON 27 PO) Take 1 tablet by mouth daily.   Yes [provider]  folic acid (FOLVITE) 203 MCG tablet Take 400 mcg by mouth daily.   Yes [provider]  lactose free nutrition (BOOST) LIQD Take 237 mLs by mouth. Serve in cup over ice once a day between meals   Yes [provider]  Multiple Vitamins-Minerals (PRESERVISION AREDS 2 PO) Take by mouth. Take one capsule twice a day for vision   Yes [provider]  omeprazole (PRILOSEC) 20 MG capsule TAKE 1 CAPSULE EVERY DAY. 07/05/17  Yes Pandey, Mahima, MD  vitamin B-12 (CYANOCOBALAMIN) 1000 MCG tablet Take 1,000 mcg by mouth daily.   Yes [provider]  latanoprost (XALATAN) 0.005 % ophthalmic solution Place 1 drop into both eyes at bedtime.    [provider]    Physical Exam: Vitals:   11/03/17 1845 11/03/17 1900 11/03/17 1915 11/03/17 1930  BP:  113/65  121/75  Pulse: (!) 43 (!) 40 86 78  Resp: (!) 22 20 19  (!) 23  SpO2: 97% 96% 98% 96%    Constitutional: NAD, calm, comfortable Eyes: PERRL, lids and conjunctivae normal ENMT: Mucous membranes are moist. Posterior pharynx clear of any exudate or lesions.Normal dentition.  Neck: normal, supple, no masses, no thyromegaly Respiratory: clear to auscultation bilaterally, no wheezing, no crackles. Normal respiratory effort. No accessory muscle use.  Cardiovascular:  Regular rate and rhythm, no murmurs / rubs / gallops. No extremity edema. 2+ pedal pulses. No carotid bruits.  Abdomen: no tenderness, no masses palpated. No hepatosplenomegaly. Bowel sounds positive.  Musculoskeletal: no clubbing / cyanosis. No joint deformity upper and lower extremities. Good ROM, no contractures. Normal muscle tone.  Skin: no rashes, lesions, ulcers. No induration Neurologic: CN 2-12  grossly intact. Sensation intact, DTR normal. Strength 5/5 in all 4.  Psychiatric: Normal judgment and insight. Alert and oriented x 3. Normal mood.    Labs on Admission: I have personally reviewed following labs and imaging studies  CBC: Recent Labs  Lab 11/03/17 1818 11/03/17 1829 11/03/17 2039  WBC 14.1*  --   --   HGB 10.3* 10.5* 10.2*  HCT 31.6* 31.0* 30.0*  MCV 95.5  --   --   PLT 213  --   --    Basic Metabolic Panel: Recent Labs  Lab 11/03/17 1818 11/03/17 1829 11/03/17 2039  NA 136 138 137  K 3.7 3.6 3.7  CL 103 101 103  CO2 25  --   --   GLUCOSE 103* 102* 146*  BUN 23* 23* 26*  CREATININE 0.89 0.90 0.90  CALCIUM 9.2  --   --    GFR: CrCl cannot be calculated (Unknown ideal weight.). Liver Function Tests: Recent Labs  Lab 11/03/17 1818  AST 20  ALT 13*  ALKPHOS 78  BILITOT 0.4  PROT 6.5  ALBUMIN 3.1*   No results for input(s): LIPASE, AMYLASE in the last 168 hours. No results for input(s): AMMONIA in the last 168 hours. Coagulation Profile: Recent Labs  Lab 11/03/17 1818  INR 1.00   Cardiac Enzymes: No results for input(s): CKTOTAL, CKMB, CKMBINDEX, TROPONINI in the last 168 hours. BNP (last 3 results) No results for input(s): PROBNP in the last 8760 hours. HbA1C: No results for input(s): HGBA1C in the last 72 hours. CBG: No results for input(s): GLUCAP in the last 168 hours. Lipid Profile: No results for input(s): CHOL, HDL, LDLCALC, TRIG, CHOLHDL, LDLDIRECT in the last 72 hours. Thyroid Function Tests: No results for input(s): TSH,  T4TOTAL, FREET4, T3FREE, THYROIDAB in the last 72 hours. Anemia Panel: No results for input(s): VITAMINB12, FOLATE, FERRITIN, TIBC, IRON, RETICCTPCT in the last 72 hours. Urine analysis: No results found for: COLORURINE, APPEARANCEUR, LABSPEC, PHURINE, GLUCOSEU, HGBUR, BILIRUBINUR, KETONESUR, PROTEINUR, UROBILINOGEN, NITRITE, LEUKOCYTESUR  Radiological Exams on Admission: No results found.  EKG: Independently reviewed.  Assessment/Plan Principal Problem:   Hematochezia    1. Hematochezia - 1. Persists in ED 2. 2 unit PRBC transfusion 3. Repeat H/H post transfusion 4. Tagged RBC scan ordered.  Spoke with Radiologist Dr. Gerilyn Nestle who is calling in the NM tech to get this done stat 5. If positive then need to call in IR for embolization.  DVT prophylaxis: SCDs Code Status: Partial code - no CPR Family Communication: Son at bedside who is HCPOA patient indicates Disposition Plan: Home after admit Consults called: None Admission status: Admit to inpatient   [place], [place] [facility] Pager [phone]  If 7AM-7PM, please contact day team taking care of patient [email or web address] Password [facility]  11/03/2017, 9:56 PM

## 2017-11-03 NOTE — ED Triage Notes (Signed)
Per EMS: Pt started to have bloody stools this afternoon x3. Normal stool with bright red blood. Denies pain or dizziness. Hx of anemia.

## 2017-11-04 ENCOUNTER — Other Ambulatory Visit: Payer: Self-pay

## 2017-11-04 ENCOUNTER — Inpatient Hospital Stay (HOSPITAL_COMMUNITY): Payer: Medicare HMO

## 2017-11-04 DIAGNOSIS — K625 Hemorrhage of anus and rectum: Secondary | ICD-10-CM

## 2017-11-04 DIAGNOSIS — D62 Acute posthemorrhagic anemia: Secondary | ICD-10-CM

## 2017-11-04 LAB — HEMOGLOBIN AND HEMATOCRIT, BLOOD
HCT: 24.6 % — ABNORMAL LOW (ref 36.0–46.0)
HCT: 27.7 % — ABNORMAL LOW (ref 36.0–46.0)
HEMATOCRIT: 22.3 % — AB (ref 36.0–46.0)
HEMATOCRIT: 29.3 % — AB (ref 36.0–46.0)
Hemoglobin: 7.4 g/dL — ABNORMAL LOW (ref 12.0–15.0)
Hemoglobin: 8.1 g/dL — ABNORMAL LOW (ref 12.0–15.0)
Hemoglobin: 9.4 g/dL — ABNORMAL LOW (ref 12.0–15.0)
Hemoglobin: 9.8 g/dL — ABNORMAL LOW (ref 12.0–15.0)

## 2017-11-04 LAB — GLUCOSE, CAPILLARY
GLUCOSE-CAPILLARY: 100 mg/dL — AB (ref 65–99)
GLUCOSE-CAPILLARY: 119 mg/dL — AB (ref 65–99)
GLUCOSE-CAPILLARY: 131 mg/dL — AB (ref 65–99)
Glucose-Capillary: 104 mg/dL — ABNORMAL HIGH (ref 65–99)

## 2017-11-04 LAB — MRSA PCR SCREENING: MRSA by PCR: NEGATIVE

## 2017-11-04 LAB — PREPARE RBC (CROSSMATCH)

## 2017-11-04 LAB — ABO/RH: ABO/RH(D): B POS

## 2017-11-04 MED ORDER — PANTOPRAZOLE SODIUM 40 MG IV SOLR
40.0000 mg | INTRAVENOUS | Status: DC
  Administered 2017-11-04 – 2017-11-14 (×11): 40 mg via INTRAVENOUS
  Filled 2017-11-04 (×11): qty 40

## 2017-11-04 MED ORDER — TECHNETIUM TC 99M-LABELED RED BLOOD CELLS IV KIT
27.2000 | PACK | Freq: Once | INTRAVENOUS | Status: AC | PRN
  Administered 2017-11-03: 27.2 via INTRAVENOUS

## 2017-11-04 MED ORDER — KCL IN DEXTROSE-NACL 20-5-0.9 MEQ/L-%-% IV SOLN
INTRAVENOUS | Status: DC
  Administered 2017-11-04 – 2017-11-05 (×2): 50 mL/h via INTRAVENOUS
  Administered 2017-11-06 – 2017-11-08 (×2): via INTRAVENOUS
  Filled 2017-11-04 (×4): qty 1000

## 2017-11-04 MED ORDER — SODIUM CHLORIDE 0.9 % IV SOLN: Freq: Once | INTRAVENOUS | Status: DC

## 2017-11-04 NOTE — Progress Notes (Signed)
No charge note:  Patient now resuming having large BRBPR, 2 episodes this am in past hour after having stopped overnight. 1) spoke with radiology, is okay to repeat NM tagged RBC scan 2) 2 more units PRBC transfusion ordered 3) RN staff has paged Dr. Carolin Sicks to let him know

## 2017-11-04 NOTE — Progress Notes (Signed)
At about [phone] patient had a medium bright red blood with stool per her rectum.

## 2017-11-04 NOTE — Care Management Note (Signed)
Case Management Note  Patient Details  Name: [name] MRN: [id number] Date of Birth: 1921-05-22  Subjective/Objective:                  Gi bleeding  Action/Plan: Date: November 04, 2017 Velva Harman, BSN, [place], [place] Chart and notes review for patient progress and needs. Will follow for case management and discharge needs. Next review date: [id number] Expected Discharge Date:                  Expected Discharge Plan:  Home/Self Care  In-House Referral:     Discharge planning Services  CM Consult  Post Acute Care Choice:    Choice offered to:     DME Arranged:    DME Agency:     HH Arranged:    HH Agency:     Status of Service:  In process, will continue to follow  If discussed at Long Length of Stay Meetings, dates discussed:    Additional Comments:  Leeroy Cha, RN 11/04/2017, 8:42 AM

## 2017-11-04 NOTE — Consult Note (Signed)
Consultation  Referring Provider:     Dr. Carolin Sicks Primary Care Physician:  Blanchie Serve, MD Primary Gastroenterologist:  Dr. Oneida Alar       Reason for Consultation: [name]         HPI:   [name] is a 83 y.o. female with a past medical history as listed below including previous diverticular bleed in 2012, who presented to the ER on 11/03/17 with complaints of bright red blood per rectum.    Today, the patient tells me that around 3:30 PM yesterday she was watching a football game and urgently had to have a bowel movement, this bowel movement had a large amount of bright red blood which filled her toilet bowl.  She had 2 more before calling the nurse at her assisted living facility and a couple more while they were waiting for the ambulance.  She then went to the ER and tells me that she continually had bright red blood per rectum while in the ER.  She was sent for a bleeding scan which she tells me was negative.  Patient describes that overnight she did not have very many bowel movements but then again this morning she has already had two, in fact, she has just been cleaned by the nurse about 10 minutes prior to my arrival to her room.  The patient describes a generalized abdominal discomfort but no acute pain.    Patient recalls a previous episode in 2012, she tells me all about her colonoscopy and how they ended up clipping a diverticulum.  She tells me she has not had trouble since then.  She does have chronic constipation for which she eats "fig newtons", but overall is in good health.    Patient does describe being on aspirin 81 mg daily.    Patient denies fever, chills, weight loss, anorexia, nausea, vomiting, heartburn, reflux, recent change in bowel habits or symptoms that awaken her at night.  ED Course: Episodes persisted in ED.  HGB 10.2.  2 units PRBC transfusion ordered by EDP, felt not to have equilibrated yet.  GI called and rec'd tagged RBC scan.  Previous GI  history: 2012-colonoscopy, Dr. Oneida Alar: For bright red blood per rectum: Impression multiple scattered diverticula, one site clipped with signs of dried blood, small internal hemorrhoids and extensive redundant colon Previous in chart.  Past Medical History:  Diagnosis Date  . Allergic rhinitis   . Anemia    NOS  . Anxiety   . Biceps tendon rupture    Bilateral  . Breast cancer ([place]) 2001   Left s/p lumpectomy and XRT   . Bronchiectasis ([place]) 2014   Noted on chest x-ray  . Cough 2014  . Depression   . Diverticulosis   . Elevated liver enzymes    Biopsy consistent with steatohepatitis  . Fundic gland polyps of stomach, benign   . GERD (gastroesophageal reflux disease) 06/2007   EGD Dr Gala Romney >sm HH, multiple fundic gland polyps  . Glaucoma 2013   both eyes  . Hemorrhoids   . Hiatal hernia   . Hx of radiation therapy 07/17/10 to 07/21/10   SRS LLL lung  . Hyperlipidemia   . Insomnia   . Low back pain    scoliosis  . Lung cancer ([place]) 05/2010   Left 2011 - rad x 3  . Lymphedema    Left arm  . Osteoarthritis   . Osteopenia 10/25/2016  . Osteoporosis, senile   . Overactive bladder   .  Pneumonia    RLL with sepsis  . PSVT (paroxysmal supraventricular tachycardia) ([place])   . Rheumatic fever    Age 81  . Right thyroid nodule   . Steatohepatitis    liver bx  . Type 2 diabetes mellitus ([place])     Past Surgical History:  Procedure Laterality Date  . ABDOMINAL HYSTERECTOMY  1964  . APPENDECTOMY  1964  . BIOPSY THYROID  11/2008  . BREAST BIOPSY     X 3 w/ cystectomy  . BREAST LUMPECTOMY  2001  . CATARACT EXTRACTION Bilateral 2003   Lens implant Dr. Charise Killian  . CHOLECYSTECTOMY  1965  . COLONOSCOPY  2005 /2012   Dr. Lucianne Muss- diverticulosis, ext hemorrhoids.  . COLONOSCOPY N/A 09/11/2011   Performed by Danie Binder, MD at [place] [facility]  . CYSTOSCOPY  2007  . ESOPHAGOGASTRODUODENOSCOPY (EGD) N/A 09/11/2011   Performed by Danie Binder, MD at [place] [facility]  2008   . LUNG BIOPSY Left 06/06/2010  . REVISION TOTAL HIP ARTHROPLASTY  2007   Left  . SKIN CANCER EXCISION     nose and left lower cheek  . TONSILLECTOMY  1930  . TOTAL HIP ARTHROPLASTY Bilateral 2005 & 2007    Dr. Earl Lagos. Aline Brochure     Family History  Problem Relation Age of Onset  . Heart failure Mother   . Osteoporosis Mother   . Hypertension Father   . Stroke Father   . Heart failure Father   . Leukemia Brother   . Heart failure Brother   . Cancer Maternal Grandmother   . Stroke Maternal Grandfather   . Cancer Unknown      Social History   Tobacco Use  . Smoking status: Never Smoker  . Smokeless tobacco: Never Used  . Tobacco comment: 2nd hand-husband  Substance Use Topics  . Alcohol use: No  . Drug use: No    Prior to Admission medications   Medication Sig Start Date End Date Taking? Authorizing Provider  acetaminophen (TYLENOL) 325 MG tablet Take 650 mg by mouth. Take 2 tablets every 6 hours as needed   Yes [provider]  aspirin 81 MG EC tablet Take 81 mg by mouth daily.     Yes [provider]  atenolol (TENORMIN) 25 MG tablet Take 0.5 tablets (12.5 mg total) by mouth 2 (two) times daily. 10/11/17  Yes Mast, Man X, NP  cetirizine (ZYRTEC) 10 MG tablet Take 10 mg by mouth daily.   Yes [provider]  Dextromethorphan-Guaifenesin (ROBAFEN DM) 10-100 MG/5ML liquid Take 5 mLs by mouth. 10 ml by mouth every 6 hours as need for cough in 48 hours   Yes [provider]  dorzolamide-timolol (COSOPT) 22.3-6.8 MG/ML ophthalmic solution Place 1 drop 2 (two) times daily into both eyes. One drop in each eye twice daily    Yes [provider]  DULoxetine (CYMBALTA) 20 MG capsule TAKE (1) CAPSULE DAILY. 09/04/17  Yes Mast, Man X, NP  Ferrous Gluconate (IRON 27 PO) Take 1 tablet by mouth daily.   Yes [provider]  folic acid (FOLVITE) 245 MCG tablet Take 400 mcg by mouth daily.   Yes [provider]  lactose free  nutrition (BOOST) LIQD Take 237 mLs by mouth. Serve in cup over ice once a day between meals   Yes [provider]  Multiple Vitamins-Minerals (PRESERVISION AREDS 2 PO) Take by mouth. Take one capsule twice a day for vision   Yes  [provider]  omeprazole (PRILOSEC) 20 MG capsule TAKE 1 CAPSULE EVERY DAY. 07/05/17  Yes Pandey, Mahima, MD  vitamin B-12 (CYANOCOBALAMIN) 1000 MCG tablet Take 1,000 mcg by mouth daily.   Yes [provider]  latanoprost (XALATAN) 0.005 % ophthalmic solution Place 1 drop into both eyes at bedtime.    [provider]    Current Facility-Administered Medications  Medication Dose Route Frequency Provider Last Rate Last Dose  . 0.9 %  sodium chloride infusion   Intravenous Once Etta Quill, DO      . acetaminophen (TYLENOL) tablet 650 mg  650 mg Oral Q6H PRN Etta Quill, DO       Or  . acetaminophen (TYLENOL) suppository 650 mg  650 mg Rectal Q6H PRN Etta Quill, DO      . dorzolamide-timolol (COSOPT) 22.3-6.8 MG/ML ophthalmic solution 1 drop  1 drop Both Eyes BID Etta Quill, DO   1 drop at 11/03/17 2247  . DULoxetine (CYMBALTA) DR capsule 20 mg  20 mg Oral Daily Alcario Drought, Jared M, DO      . folic acid (FOLVITE) tablet 0.5 mg  500 mcg Oral Daily Alcario Drought, Jared M, DO      . latanoprost (XALATAN) 0.005 % ophthalmic solution 1 drop  1 drop Both Eyes QHS Jennette Kettle M, DO   1 drop at 11/03/17 2246  . loratadine (CLARITIN) tablet 10 mg  10 mg Oral Daily Jennette Kettle M, DO      . ondansetron [facility]) tablet 4 mg  4 mg Oral Q6H PRN Etta Quill, DO       Or  . ondansetron [facility]) injection 4 mg  4 mg Intravenous Q6H PRN Etta Quill, DO      . pantoprazole (PROTONIX) EC tablet 40 mg  40 mg Oral Daily Jennette Kettle M, DO   40 mg at 11/03/17 2244  . vitamin B-12 (CYANOCOBALAMIN) tablet 1,000 mcg  1,000 mcg Oral Daily Jennette Kettle M, DO        Allergies as of 11/03/2017 - Review Complete 11/03/2017    Allergen Reaction Noted  . Ibuprofen Hives 01/28/2007  . Naproxen  09/30/2012  . Statins Other (See Comments) 01/28/2007  . Surgilube [gyne-moistrin]  07/30/2013  . Tape Rash 03/11/2012     Review of Systems:    Constitutional: No weight loss, fever or chills Skin: No rash Cardiovascular: No chest pain Respiratory: No SOB  Gastrointestinal: See HPI and otherwise negative Genitourinary: No dysuria Neurological: No headache Musculoskeletal: No new muscle or joint pain Hematologic: No bruising Psychiatric: No history of depression or anxiety   Physical Exam:  Vital signs in last 24 hours: Temp:  [97.7 F (36.5 C)-98.1 F (36.7 C)] 97.7 F (36.5 C) (11/19 0805) Pulse Rate:  [40-89] 89 (11/19 0805) Resp:  [15-40] 18 (11/19 0805) BP: (93-146)/(36-83) 132/61 (11/19 0805) SpO2:  [92 %-100 %] 96 % (11/19 0805) Weight:  [155 lb 3.3 oz (70.4 kg)] 155 lb 3.3 oz (70.4 kg) (11/18 2233) Last BM Date: 11/04/17 General:   Pleasant Elderly Caucasian female appears to be in NAD, Well developed, Well nourished, alert and cooperative Head:  Normocephalic and atraumatic. Eyes:   PEERL, EOMI. No icterus. Conjunctiva pink. Ears:  Normal auditory acuity. Neck:  Supple Throat: Oral cavity and pharynx without inflammation, swelling or lesion.  Lungs: Respirations even and unlabored. Lungs clear to auscultation bilaterally.   No wheezes, crackles, or rhonchi.  Heart: Normal S1, S2. No MRG. Regular rate  and rhythm. No peripheral edema, cyanosis or pallor.  Abdomen:  Soft, nondistended, mild generalized ttp, No rebound or guarding. Normal bowel sounds. No appreciable masses or hepatomegaly. Rectal:  External: No hemorrhoids, no current bleeding Msk:  Symmetrical without gross deformities.  Extremities:  Without edema, no deformity or joint abnormality.  Neurologic:  Alert and  oriented x4;  grossly normal neurologically.  Skin:   Dry and intact without significant lesions or rashes. Psychiatric:   Demonstrates good judgement and reason without abnormal affect or behaviors.   LAB RESULTS: Recent Labs    11/03/17 1818 11/03/17 1829 11/03/17 2039 11/04/17 0000  WBC 14.1*  --   --   --   HGB 10.3* 10.5* 10.2* 7.4*  HCT 31.6* 31.0* 30.0* 22.3*  PLT 213  --   --   --    BMET Recent Labs    11/03/17 1818 11/03/17 1829 11/03/17 2039  NA 136 138 137  K 3.7 3.6 3.7  CL 103 101 103  CO2 25  --   --   GLUCOSE 103* 102* 146*  BUN 23* 23* 26*  CREATININE 0.89 0.90 0.90  CALCIUM 9.2  --   --    LFT Recent Labs    11/03/17 1818  PROT 6.5  ALBUMIN 3.1*  AST 20  ALT 13*  ALKPHOS 78  BILITOT 0.4   PT/INR Recent Labs    11/03/17 1818  LABPROT 13.1  INR 1.00    STUDIES: Nm Gi Blood Loss  Result Date: 11/04/2017 CLINICAL DATA:  83 year old female with bright red blood per rectum. EXAM: NUCLEAR MEDICINE GASTROINTESTINAL BLEEDING SCAN TECHNIQUE: Sequential abdominal images were obtained following intravenous administration of Tc-61mlabeled red blood cells. RADIOPHARMACEUTICALS:  27.2 mCi Tc-910mn-vitro labeled red cells. COMPARISON:  PET-CT dated 01/30/2010 FINDINGS: There is physiologic uptake in the liver, spleen as well as physiologic activity in the aorta, IVC, and iliac vasculature. There is physiologic uptake and excretion of radiotracer by the kidneys and in the bladder. No abnormal activity identified conforming to the bowel to suggest active GI bleed. IMPRESSION: No evidence of active GI bleed. Electronically Signed   By: ArAnner Crete.D.   On: 11/04/2017 02:13     PREVIOUS ENDOSCOPIES:            See HPI   Impression / Plan:   Impression: 1. Lower GI Bleed: started at 3pm yesterday, multiple BM with brb since then, last 10 min ago, s/p 2 u prbcs, receiving 2 more today, NM bleeding scan ordered for this morning, vitals stable, h/o previous diverticular bleed, likely this is the same 2. ABLA: with above, hgb from 10.2 to 7.4 overnight  Plan: 1. Agree  with blood transfusion and repeat NM bleeding scan this morning 2. NPO for now, will likely be able to start clears after bleeding scan 3. Pending results of bleeding scan will decide on possible colonoscopy, but for now supportive measures and observation  4. Please await final recommendations from Dr. NaSilverio Decampater today  Thank you for your kind consultation, we will continue to follow.  JeLavone NianeAurora Las Encinas Hospital, LLC11/19/2018, 8:43 AM Pager #: [phone] Attending physician's note   I have taken a history, examined the patient and reviewed the chart. I agree with the Advanced Practitioner's note, impression and recommendations. [age]ear old female with painless hematochezia, significant drop in hemoglobin.  Presentation is concerning for acute diverticular hemorrhage.  Reviewed prior colonoscopy report from 2012 with pan colonic diverticulosis Nuclear med RBC tag scan  was negative to localize site of bleeding. Continue supportive care Monitor hemoglobin every 8 hours and transfuse as needed to maintain hemoglobin greater than 7 Clear liquids Will consider colonoscopy if she continues to have recurrent episodes of large-volume hematochezia and unable to localize site of bleeding on nuclear med scan.  Patient is higher risk for possible anesthesia and procedure related complications given her age  Damaris Hippo, MD [phone] Mon-Fri 8a-5p [phone] after 5p, weekends, holidays

## 2017-11-04 NOTE — Progress Notes (Signed)
PROGRESS NOTE    [name]  NOM:[id number] DOB: [birth year] DOA: 11/03/2017 PCP: Blanchie Serve, MD   Brief Narrative: 83 year old functional female with history of diverticular bleed in 2012 presented to the ER with complaints of bright red blood per rectum since 3 PM on the day of admission.  Patient likely with diverticular bleed.  GI consulted.  Assessment & Plan:   # Hematochezia likely diverticular bleed: Patient with painless lower GI bleed.  She had 2 large bloody bowel movement this morning.  Receiving 2 units of blood today which was ordered on admission with plan to transfuse 2 more depending on repeat hemoglobin.  The initial bleeding scan was negative for active bleed.  Since patient had to bowel movement this morning with large blood, repeat bleeding scan was ordered.  Follow-up results.  Continue to monitor hemoglobin every 6-8 hourly and follow-up the scan result.  GI consult was requested.  At this time patient is hemodynamically stable.  I discussed with admitting physician Dr. Alcario Drought. -ASA on hold -Currently patient is n.p.o., start D5NS+KCL  -Change Protonix to IV.  # Acute blood loss anemia due to GI bleeding: Blood transfusion and monitoring CBC as discussed above.  Patient is hemodynamically stable.  Denied headache, dizziness, chest pain or shortness of breath.  Continue current medical and supportive care.  Close monitoring in stepdown unit.  Follow-up test result and GIs recommendation.  Discussed with the patient, her son and patient's nurse.  DVT prophylaxis: SCD Code Status: Partial code Family Communication: Discussed with the patient's son Disposition Plan: Stepdown unit    Consultants:   GI  Procedures: Bleeding scan Antimicrobials: None  Subjective: Seen and examined at bedside.  Denies headache, dizziness, nausea vomiting chest pain shortness of breath.  Had 2 large bowel movement today with blood  Objective: Vitals:   11/04/17 0649 11/04/17  0657 11/04/17 0805 11/04/17 0930  BP:   132/61 (!) 117/54  Pulse: 86 87 89 91  Resp: (!) 22 (!) 24 18 (!) 23  Temp: 97.8 F (36.6 C)  97.7 F (36.5 C) 98 F (36.7 C)  TempSrc: Oral  Oral Oral  SpO2: 99% 99% 96% 96%  Weight:      Height:        Intake/Output Summary (Last 24 hours) at 11/04/2017 1030 Last data filed at 11/04/2017 0930 Gross per 24 hour  Intake 935 ml  Output -  Net 935 ml   Filed Weights   11/03/17 2233  Weight: 70.4 kg (155 lb 3.3 oz)    Examination:  General exam: Appears calm and comfortable  Respiratory system: Clear to auscultation. Respiratory effort normal. No wheezing or crackle Cardiovascular system: S1 & S2 heard, RRR.  No pedal edema. Gastrointestinal system: Abdomen is nondistended, soft and nontender. Normal bowel sounds heard. Central nervous system: Alert and oriented. No focal neurological deficits. Extremities: Symmetric 5 x 5 power. Skin: No rashes, lesions or ulcers Psychiatry: Judgement and insight appear normal. Mood & affect appropriate.     Data Reviewed: I have personally reviewed following labs and imaging studies  CBC: Recent Labs  Lab 11/03/17 1818 11/03/17 1829 11/03/17 2039 11/04/17 0000  WBC 14.1*  --   --   --   HGB 10.3* 10.5* 10.2* 7.4*  HCT 31.6* 31.0* 30.0* 22.3*  MCV 95.5  --   --   --   PLT 213  --   --   --    Basic Metabolic Panel: Recent Labs  Lab 11/03/17 1818  11/03/17 1829 11/03/17 2039  NA 136 138 137  K 3.7 3.6 3.7  CL 103 101 103  CO2 25  --   --   GLUCOSE 103* 102* 146*  BUN 23* 23* 26*  CREATININE 0.89 0.90 0.90  CALCIUM 9.2  --   --    GFR: Estimated Creatinine Clearance: 36 mL/min (by C-G formula based on SCr of 0.9 mg/dL). Liver Function Tests: Recent Labs  Lab 11/03/17 1818  AST 20  ALT 13*  ALKPHOS 78  BILITOT 0.4  PROT 6.5  ALBUMIN 3.1*   No results for input(s): LIPASE, AMYLASE in the last 168 hours. No results for input(s): AMMONIA in the last 168  hours. Coagulation Profile: Recent Labs  Lab 11/03/17 1818  INR 1.00   Cardiac Enzymes: No results for input(s): CKTOTAL, CKMB, CKMBINDEX, TROPONINI in the last 168 hours. BNP (last 3 results) No results for input(s): PROBNP in the last 8760 hours. HbA1C: No results for input(s): HGBA1C in the last 72 hours. CBG: No results for input(s): GLUCAP in the last 168 hours. Lipid Profile: No results for input(s): CHOL, HDL, LDLCALC, TRIG, CHOLHDL, LDLDIRECT in the last 72 hours. Thyroid Function Tests: No results for input(s): TSH, T4TOTAL, FREET4, T3FREE, THYROIDAB in the last 72 hours. Anemia Panel: No results for input(s): VITAMINB12, FOLATE, FERRITIN, TIBC, IRON, RETICCTPCT in the last 72 hours. Sepsis Labs: No results for input(s): PROCALCITON, LATICACIDVEN in the last 168 hours.  Recent Results (from the past 240 hour(s))  MRSA PCR Screening     Status: None   Collection Time: 11/03/17 10:10 PM  Result Value Ref Range Status   MRSA by PCR NEGATIVE NEGATIVE Final    Comment:        The GeneXpert MRSA Assay (FDA approved for NASAL specimens only), is one component of a comprehensive MRSA colonization surveillance program. It is not intended to diagnose MRSA infection nor to guide or monitor treatment for MRSA infections.          Radiology Studies: Nm Gi Blood Loss  Result Date: 11/04/2017 CLINICAL DATA:  83 year old female with bright red blood per rectum. EXAM: NUCLEAR MEDICINE GASTROINTESTINAL BLEEDING SCAN TECHNIQUE: Sequential abdominal images were obtained following intravenous administration of Tc-21mlabeled red blood cells. RADIOPHARMACEUTICALS:  27.2 mCi Tc-956mn-vitro labeled red cells. COMPARISON:  PET-CT dated 01/30/2010 FINDINGS: There is physiologic uptake in the liver, spleen as well as physiologic activity in the aorta, IVC, and iliac vasculature. There is physiologic uptake and excretion of radiotracer by the kidneys and in the bladder. No abnormal  activity identified conforming to the bowel to suggest active GI bleed. IMPRESSION: No evidence of active GI bleed. Electronically Signed   By: ArAnner Crete.D.   On: 11/04/2017 02:13        Scheduled Meds: . dorzolamide-timolol  1 drop Both Eyes BID  . DULoxetine  20 mg Oral Daily  . folic acid  50811cg Oral Daily  . latanoprost  1 drop Both Eyes QHS  . loratadine  10 mg Oral Daily  . pantoprazole  40 mg Oral Daily  . vitamin B-12  1,000 mcg Oral Daily   Continuous Infusions: . sodium chloride       LOS: 1 day    Dron PrTanna FurryMD [facility] Pager [phone] 7PM-7AM, please contact night-coverage [email or web address] Password [email or web address] 11/04/2017, 10:30 AM

## 2017-11-05 LAB — BASIC METABOLIC PANEL
Anion gap: 4 — ABNORMAL LOW (ref 5–15)
BUN: 34 mg/dL — ABNORMAL HIGH (ref 6–20)
CHLORIDE: 109 mmol/L (ref 101–111)
CO2: 22 mmol/L (ref 22–32)
CREATININE: 0.84 mg/dL (ref 0.44–1.00)
Calcium: 8.2 mg/dL — ABNORMAL LOW (ref 8.9–10.3)
GFR, EST NON AFRICAN AMERICAN: 58 mL/min — AB (ref >60)
Glucose, Bld: 119 mg/dL — ABNORMAL HIGH (ref 65–99)
POTASSIUM: 3.9 mmol/L (ref 3.5–5.1)
SODIUM: 135 mmol/L (ref 135–145)

## 2017-11-05 LAB — HEMOGLOBIN AND HEMATOCRIT, BLOOD
HCT: 25.3 % — ABNORMAL LOW (ref 36.0–46.0)
HEMATOCRIT: 24.8 % — AB (ref 36.0–46.0)
HEMATOCRIT: 22.8 % — AB (ref 36.0–46.0)
HEMOGLOBIN: 9 g/dL — AB (ref 12.0–15.0)
HEMOGLOBIN: 8.5 g/dL — AB (ref 12.0–15.0)
HEMOGLOBIN: 7.9 g/dL — AB (ref 12.0–15.0)

## 2017-11-05 LAB — GLUCOSE, CAPILLARY
GLUCOSE-CAPILLARY: 100 mg/dL — AB (ref 65–99)
GLUCOSE-CAPILLARY: 109 mg/dL — AB (ref 65–99)
GLUCOSE-CAPILLARY: 114 mg/dL — AB (ref 65–99)
GLUCOSE-CAPILLARY: 116 mg/dL — AB (ref 65–99)
GLUCOSE-CAPILLARY: 84 mg/dL (ref 65–99)
Glucose-Capillary: 123 mg/dL — ABNORMAL HIGH (ref 65–99)

## 2017-11-05 NOTE — Progress Notes (Signed)
PROGRESS NOTE    [name]  OHY:[id number] DOB: [birth year] DOA: 11/03/2017 PCP: Blanchie Serve, MD   Brief Narrative: 83 year old functional female with history of diverticular bleed in 2012 presented to the ER with complaints of bright red blood per rectum since 3 PM on the day of admission.  Patient likely with diverticular bleed.  GI consulted.  Assessment & Plan:   # Hematochezia likely diverticular bleed: Patient with painless lower GI bleed.  -Patient with one bowel movement today with dark colored stool likely residual oral bleeding.  No significant drop in hemoglobin today.  Continue to monitor CBC.  If patient has active bleeding or significant drop in hemoglobin consider bleeding scan.  GI is following appreciated their consult.  Currently on clear liquid diet.  Continue supportive care and close monitoring. -Aspirin on hold.  # Acute blood loss anemia due to GI bleeding: Received at least 3 units of red blood cell transfusion.  Monitor hemoglobin.  Continue current medical and supportive care including close observation in stepdown unit for lower GI bleed.  At this time, patient is hemodynamically stable with no significant drop in hemoglobin.  This is likely diverticular bleed with most likely resolves.  I have discussed this with the patient at bedside.  DVT prophylaxis: SCD Code Status: Partial code Family Communication: Discussed with the patient's son yesterday Disposition Plan: Stepdown unit    Consultants:   GI  Procedures: Bleeding scan Antimicrobials: None  Subjective: Seen and examined at bedside.  One bowel movement with dark colored.  Denies headache, dizziness, nausea, vomiting, abdominal pain, chest pain or shortness of breath.  Objective: Vitals:   11/05/17 0400 11/05/17 0500 11/05/17 0600 11/05/17 0800  BP: (!) 106/54   (!) 127/49  Pulse: 99 94 100 (!) 103  Resp: 18 18 19 18   Temp:    98.5 F (36.9 C)  TempSrc:    Oral  SpO2: 94% 94% 94% 96%    Weight:      Height:        Intake/Output Summary (Last 24 hours) at 11/05/2017 1045 Last data filed at 11/05/2017 9485 Gross per 24 hour  Intake 1765.33 ml  Output 310 ml  Net 1455.33 ml   Filed Weights   11/03/17 2233  Weight: 70.4 kg (155 lb 3.3 oz)    Examination:  General exam: Lying in bed comfortable, not in distress Respiratory system: Clear bilateral, respiratory effort normal, no wheezing or crackle cardiovascular system: Regular rate rhythm, S1-S2 normal.  No pedal edema. Gastrointestinal system: Abdomen soft, nontender, nondistended.  Bowel sounds positive Central nervous system: Alert and oriented. No focal neurological deficits. Extremities: Symmetric 5 x 5 power. Skin: No rashes, lesions or ulcers Psychiatry: Judgement and insight appear normal. Mood & affect appropriate.     Data Reviewed: I have personally reviewed following labs and imaging studies  CBC: Recent Labs  Lab 11/03/17 1818  11/04/17 0000 11/04/17 1133 11/04/17 1805 11/04/17 2342 11/05/17 0635  WBC 14.1*  --   --   --   --   --   --   HGB 10.3*   < > 7.4* 9.8* 8.1* 9.4* 9.0*  HCT 31.6*   < > 22.3* 29.3* 24.6* 27.7* 25.3*  MCV 95.5  --   --   --   --   --   --   PLT 213  --   --   --   --   --   --    < > = values  in this interval not displayed.   Basic Metabolic Panel: Recent Labs  Lab 11/03/17 1818 11/03/17 1829 11/03/17 2039 11/05/17 0346  NA 136 138 137 135  K 3.7 3.6 3.7 3.9  CL 103 101 103 109  CO2 25  --   --  22  GLUCOSE 103* 102* 146* 119*  BUN 23* 23* 26* 34*  CREATININE 0.89 0.90 0.90 0.84  CALCIUM 9.2  --   --  8.2*   GFR: Estimated Creatinine Clearance: 38.5 mL/min (by C-G formula based on SCr of 0.84 mg/dL). Liver Function Tests: Recent Labs  Lab 11/03/17 1818  AST 20  ALT 13*  ALKPHOS 78  BILITOT 0.4  PROT 6.5  ALBUMIN 3.1*   No results for input(s): LIPASE, AMYLASE in the last 168 hours. No results for input(s): AMMONIA in the last 168  hours. Coagulation Profile: Recent Labs  Lab 11/03/17 1818  INR 1.00   Cardiac Enzymes: No results for input(s): CKTOTAL, CKMB, CKMBINDEX, TROPONINI in the last 168 hours. BNP (last 3 results) No results for input(s): PROBNP in the last 8760 hours. HbA1C: No results for input(s): HGBA1C in the last 72 hours. CBG: Recent Labs  Lab 11/04/17 1923 11/04/17 2004 11/04/17 2347 11/05/17 0350 11/05/17 0936  GLUCAP 131* 119* 100* 114* 109*   Lipid Profile: No results for input(s): CHOL, HDL, LDLCALC, TRIG, CHOLHDL, LDLDIRECT in the last 72 hours. Thyroid Function Tests: No results for input(s): TSH, T4TOTAL, FREET4, T3FREE, THYROIDAB in the last 72 hours. Anemia Panel: No results for input(s): VITAMINB12, FOLATE, FERRITIN, TIBC, IRON, RETICCTPCT in the last 72 hours. Sepsis Labs: No results for input(s): PROCALCITON, LATICACIDVEN in the last 168 hours.  Recent Results (from the past 240 hour(s))  MRSA PCR Screening     Status: None   Collection Time: 11/03/17 10:10 PM  Result Value Ref Range Status   MRSA by PCR NEGATIVE NEGATIVE Final    Comment:        The GeneXpert MRSA Assay (FDA approved for NASAL specimens only), is one component of a comprehensive MRSA colonization surveillance program. It is not intended to diagnose MRSA infection nor to guide or monitor treatment for MRSA infections.          Radiology Studies: Nm Gi Blood Loss  Result Date: 11/04/2017 CLINICAL DATA:  83 year old female with bright red blood per rectum. EXAM: NUCLEAR MEDICINE GASTROINTESTINAL BLEEDING SCAN TECHNIQUE: Sequential abdominal images were obtained following intravenous administration of Tc-19mlabeled red blood cells. RADIOPHARMACEUTICALS:  27.2 mCi Tc-972mn-vitro labeled red cells. COMPARISON:  PET-CT dated 01/30/2010 FINDINGS: There is physiologic uptake in the liver, spleen as well as physiologic activity in the aorta, IVC, and iliac vasculature. There is physiologic uptake and  excretion of radiotracer by the kidneys and in the bladder. No abnormal activity identified conforming to the bowel to suggest active GI bleed. IMPRESSION: No evidence of active GI bleed. Electronically Signed   By: ArAnner Crete.D.   On: 11/04/2017 02:13        Scheduled Meds: . dorzolamide-timolol  1 drop Both Eyes BID  . DULoxetine  20 mg Oral Daily  . folic acid  50638cg Oral Daily  . latanoprost  1 drop Both Eyes QHS  . loratadine  10 mg Oral Daily  . pantoprazole (PROTONIX) IV  40 mg Intravenous Q24H  . vitamin B-12  1,000 mcg Oral Daily   Continuous Infusions: . sodium chloride    . dextrose 5 % and 0.9 % NaCl with KCl  20 mEq/L 50 mL/hr at 11/05/17 0800     LOS: 2 days    Anaston Koehn Tanna Furry, MD [facility] Pager [phone]  If 7PM-7AM, please contact night-coverage [email or web address] Password [email or web address] 11/05/2017, 10:45 AM

## 2017-11-05 NOTE — Progress Notes (Signed)
Pt had mod amt (300 cc) dk red stool with clots.  Will monitor closely.  Vs stable.

## 2017-11-05 NOTE — Progress Notes (Signed)
Progress Note   Subjective  Chief Complaint: Hematochezia  Pt is found laying in bed clean at the moment with CNA by her side after passing "a large amount of maroon/dark blood and clots" about ten minutes prior to my arrival. Patient explains that she slept on her side throughout the night, but after sitting up and eating some clears for breakfast she then passes more blood. Her last BM/passing blood prior to this was 6pm yesterday. Patient continue to deny abdominal pain or other symptoms.    Objective   Vital signs in last 24 hours: Temp:  [97.8 F (36.6 C)-98.9 F (37.2 C)] 98.5 F (36.9 C) (11/20 0800) Pulse Rate:  [85-105] 100 (11/20 0600) Resp:  [17-27] 19 (11/20 0600) BP: (103-127)/(28-74) 106/54 (11/20 0400) SpO2:  [91 %-96 %] 94 % (11/20 0600) Last BM Date: 11/04/17 General:    Elderly Caucasian female in NAD Heart:  Regular rate and rhythm; no murmurs Lungs: Respirations even and unlabored, lungs CTA bilaterally Abdomen:  Soft, nontender and nondistended. Normal bowel sounds. Extremities:  Without edema. Neurologic:  Alert and oriented,  grossly normal neurologically. Psych:  Cooperative. Normal mood and affect.  Intake/Output from previous day: 11/19 0701 - 11/20 0700 In: 1140.3 [P.O.:480; I.V.:38.3; Blood:622] Out: 185 [Urine:185]  Lab Results: Recent Labs    11/03/17 1818  11/04/17 1805 11/04/17 2342 11/05/17 0635  WBC 14.1*  --   --   --   --   HGB 10.3*   < > 8.1* 9.4* 9.0*  HCT 31.6*   < > 24.6* 27.7* 25.3*  PLT 213  --   --   --   --    < > = values in this interval not displayed.   BMET Recent Labs    11/03/17 1818 11/03/17 1829 11/03/17 2039 11/05/17 0346  NA 136 138 137 135  K 3.7 3.6 3.7 3.9  CL 103 101 103 109  CO2 25  --   --  22  GLUCOSE 103* 102* 146* 119*  BUN 23* 23* 26* 34*  CREATININE 0.89 0.90 0.90 0.84  CALCIUM 9.2  --   --  8.2*   LFT Recent Labs    11/03/17 1818  PROT 6.5  ALBUMIN 3.1*  AST 20  ALT 13*    ALKPHOS 78  BILITOT 0.4   PT/INR Recent Labs    11/03/17 1818  LABPROT 13.1  INR 1.00    Studies/Results: Nm Gi Blood Loss  Result Date: 11/04/2017 CLINICAL DATA:  83 year old female with bright red blood per rectum. EXAM: NUCLEAR MEDICINE GASTROINTESTINAL BLEEDING SCAN TECHNIQUE: Sequential abdominal images were obtained following intravenous administration of Tc-55mlabeled red blood cells. RADIOPHARMACEUTICALS:  27.2 mCi Tc-918mn-vitro labeled red cells. COMPARISON:  PET-CT dated 01/30/2010 FINDINGS: There is physiologic uptake in the liver, spleen as well as physiologic activity in the aorta, IVC, and iliac vasculature. There is physiologic uptake and excretion of radiotracer by the kidneys and in the bladder. No abnormal activity identified conforming to the bowel to suggest active GI bleed. IMPRESSION: No evidence of active GI bleed. Electronically Signed   By: ArAnner Crete.D.   On: 11/04/2017 02:13    Assessment / Plan:   Assessment: 1. Bright red blood per rectum: stable hgb overnight, last stool/blood passing 6pm, then again this morning around 8:30am after eating clears for breakfast; likely this continues to represent diverticular bleed 2. ABLA  Plan: 1. If patient's bleeding picks up today, would send her for another bleeding  scan.  2. Continue to monitor hgb with transfusion as needed <7. 3. Continue clear liquid diet 4. Continue other supportive measures 5. Please await any final recommendations from Dr. Silverio Decamp later today  Thank you for your kind consultation, we will continue to follow.    LOS: 2 days   Levin Erp  11/05/2017, 9:08 AM  Pager # [phone]   Attending physician's note   I have taken an interval history, reviewed the chart and examined the patient. I agree with the Advanced Practitioner's note, impression and recommendations.  Patient could be passing residual old blood from colon. Hemodynamically stable No significant  drop in Hgb overnight Recheck Hgb at noon, if stable ok to advance diet to full liquid But if has evidence of ongoing bleed, will repeat NM bleeding scan  K Denzil Magnuson, MD [phone] Mon-Fri 8a-5p [phone] after 5p, weekends, holidays

## 2017-11-05 NOTE — Progress Notes (Signed)
Hemaglobin 7.9.  VS stable.  Only 2 marroon stools today.  Katie Shorr paged to be made aware.  Will monitor closely.

## 2017-11-05 NOTE — Progress Notes (Signed)
External catheter placed.  Pt having frequent episodes of urination and feeling exhausted and weak.  "I just need to rest."  Pt also had a couple of episodes of urine incontinence.

## 2017-11-06 ENCOUNTER — Inpatient Hospital Stay (HOSPITAL_COMMUNITY): Payer: Medicare HMO

## 2017-11-06 LAB — HEMOGLOBIN AND HEMATOCRIT, BLOOD
HCT: 20.8 % — ABNORMAL LOW (ref 36.0–46.0)
HEMATOCRIT: 26.2 % — AB (ref 36.0–46.0)
Hemoglobin: 7.1 g/dL — ABNORMAL LOW (ref 12.0–15.0)
Hemoglobin: 9 g/dL — ABNORMAL LOW (ref 12.0–15.0)

## 2017-11-06 LAB — PREPARE RBC (CROSSMATCH)

## 2017-11-06 LAB — GLUCOSE, CAPILLARY
Glucose-Capillary: 101 mg/dL — ABNORMAL HIGH (ref 65–99)
Glucose-Capillary: 94 mg/dL (ref 65–99)

## 2017-11-06 LAB — HEMOGLOBIN: Hemoglobin: 8 g/dL — ABNORMAL LOW (ref 12.0–15.0)

## 2017-11-06 MED ORDER — TECHNETIUM TC 99M-LABELED RED BLOOD CELLS IV KIT
21.1000 | PACK | Freq: Once | INTRAVENOUS | Status: AC | PRN
  Administered 2017-11-06: 21.1 via INTRAVENOUS

## 2017-11-06 MED ORDER — SODIUM CHLORIDE 0.9 % IV SOLN
Freq: Once | INTRAVENOUS | Status: AC
  Administered 2017-11-06: 1 mL via INTRAVENOUS

## 2017-11-06 NOTE — Progress Notes (Signed)
[facility] PROGRESS NOTE    Progress Note  [name]  DPO:[id number] DOB: December 02, 1921 DOA: 11/03/2017 PCP: Blanchie Serve, MD     Brief Narrative:   [name] is an 83 y.o. female past medical history of diverticular bleed in 2012 presents with hematochezia started on the day of admission GI has been consulted  Assessment/Plan:   Hematochezia/Acute blood loss anemia: Likely a diverticular bleed, she denies any pain. Hemoglobin on 10/09/2017 was 12.6, today 7.1. Status post an additional 1 unit after this 7.1. She's had gotten a total of 4 units. Awaiting CBC Advance diet consult PT, she denies any black stools or hematochezia. Currently her she is hemodynamically stable, no signs of bleeding. Transfer to telemetry.   DVT prophylaxis: SCD Family Communication:none Disposition Plan/Barrier to D/C: transfer to Tele Code Status:     Code Status Orders  (From admission, onward)        Start     Ordered   11/03/17 2152  Limited resuscitation (code)  Continuous    Question Answer Comment  In the event of cardiac or respiratory ARREST: Initiate Code Blue, Call Rapid Response Yes   In the event of cardiac or respiratory ARREST: Perform CPR No   In the event of cardiac or respiratory ARREST: Perform Intubation/Mechanical Ventilation Yes   In the event of cardiac or respiratory ARREST: Use NIPPV/BiPAp only if indicated Yes   In the event of cardiac or respiratory ARREST: Administer ACLS medications if indicated Yes   In the event of cardiac or respiratory ARREST: Perform Defibrillation or Cardioversion if indicated Yes      11/03/17 2152    Code Status History    Date Active Date Inactive Code Status Order ID Comments User Context   This patient has a current code status but no historical code status.        IV Access:    Peripheral IV   Procedures and diagnostic studies:   No results found.   Medical Consultants:    None.  Anti-Infectives:    None  Subjective:    [name] she denies any abdominal pain,, has not had any bowel movement overnight.  Objective:    Vitals:   11/06/17 0430 11/06/17 0500 11/06/17 0530 11/06/17 0600  BP: (!) 106/52 (!) 113/42 (!) 125/50 (!) 105/53  Pulse: 87 89 83 81  Resp: 17 16 19 16   Temp:      TempSrc:      SpO2: 94% 95% 93% 95%  Weight:      Height:        Intake/Output Summary (Last 24 hours) at 11/06/2017 0709 Last data filed at 11/06/2017 0600 Gross per 24 hour  Intake 2468.33 ml  Output 1678 ml  Net 790.33 ml   Filed Weights   11/03/17 2233  Weight: 70.4 kg (155 lb 3.3 oz)    Exam: General exam: In no acute distress. Respiratory system: Good air movement clear to auscultation. Cardiovascular system: Regular with positive S1-S2. Gastrointestinal system: Positive bowel sounds soft nontender nondistended Central nervous system: Awake alert and oriented 3 Extremities: No extremity edema. Skin: No rashes, lesions or ulcers Psychiatry: Judgement and insight appear normal. Mood & affect appropriate.    Data Reviewed:    Labs: Basic Metabolic Panel: Recent Labs  Lab 11/03/17 1818 11/03/17 1829 11/03/17 2039 11/05/17 0346  NA 136 138 137 135  K 3.7 3.6 3.7 3.9  CL 103 101 103 109  CO2 25  --   --  22  GLUCOSE 103* 102* 146* 119*  BUN 23* 23* 26* 34*  CREATININE 0.89 0.90 0.90 0.84  CALCIUM 9.2  --   --  8.2*   GFR Estimated Creatinine Clearance: 38.5 mL/min (by C-G formula based on SCr of 0.84 mg/dL). Liver Function Tests: Recent Labs  Lab 11/03/17 1818  AST 20  ALT 13*  ALKPHOS 78  BILITOT 0.4  PROT 6.5  ALBUMIN 3.1*   No results for input(s): LIPASE, AMYLASE in the last 168 hours. No results for input(s): AMMONIA in the last 168 hours. Coagulation profile Recent Labs  Lab 11/03/17 1818  INR 1.00    CBC: Recent Labs  Lab 11/03/17 1818  11/04/17 2342 11/05/17 0635 11/05/17 1206 11/05/17 1742 11/06/17 0003  WBC 14.1*  --   --    --   --   --   --   HGB 10.3*   < > 9.4* 9.0* 8.5* 7.9* 7.1*  HCT 31.6*   < > 27.7* 25.3* 24.8* 22.8* 20.8*  MCV 95.5  --   --   --   --   --   --   PLT 213  --   --   --   --   --   --    < > = values in this interval not displayed.   Cardiac Enzymes: No results for input(s): CKTOTAL, CKMB, CKMBINDEX, TROPONINI in the last 168 hours. BNP (last 3 results) No results for input(s): PROBNP in the last 8760 hours. CBG: Recent Labs  Lab 11/05/17 1139 11/05/17 1600 11/05/17 2001 11/05/17 2338 11/06/17 0309  GLUCAP 123* 84 116* 100* 101*   D-Dimer: No results for input(s): DDIMER in the last 72 hours. Hgb A1c: No results for input(s): HGBA1C in the last 72 hours. Lipid Profile: No results for input(s): CHOL, HDL, LDLCALC, TRIG, CHOLHDL, LDLDIRECT in the last 72 hours. Thyroid function studies: No results for input(s): TSH, T4TOTAL, T3FREE, THYROIDAB in the last 72 hours.  Invalid input(s): FREET3 Anemia work up: No results for input(s): VITAMINB12, FOLATE, FERRITIN, TIBC, IRON, RETICCTPCT in the last 72 hours. Sepsis Labs: Recent Labs  Lab 11/03/17 1818  WBC 14.1*   Microbiology Recent Results (from the past 240 hour(s))  MRSA PCR Screening     Status: None   Collection Time: 11/03/17 10:10 PM  Result Value Ref Range Status   MRSA by PCR NEGATIVE NEGATIVE Final    Comment:        The GeneXpert MRSA Assay (FDA approved for NASAL specimens only), is one component of a comprehensive MRSA colonization surveillance program. It is not intended to diagnose MRSA infection nor to guide or monitor treatment for MRSA infections.      Medications:   . dorzolamide-timolol  1 drop Both Eyes BID  . DULoxetine  20 mg Oral Daily  . folic acid  921 mcg Oral Daily  . latanoprost  1 drop Both Eyes QHS  . loratadine  10 mg Oral Daily  . pantoprazole (PROTONIX) IV  40 mg Intravenous Q24H  . vitamin B-12  1,000 mcg Oral Daily   Continuous Infusions: . sodium chloride    .  dextrose 5 % and 0.9 % NaCl with KCl 20 mEq/L 30 mL/hr at 11/05/17 1900      LOS: 3 days   Charlynne Cousins  [facility] Pager [phone]  *Please refer to [email or web address], password [email or web address] to get updated schedule on who will round on this patient, as hospitalists switch teams weekly. If 7PM-7AM,  please contact night-coverage at [email or web address], password [email or web address] for any overnight needs.  11/06/2017, 7:09 AM

## 2017-11-06 NOTE — Progress Notes (Signed)
Patient is from [facility].  CSW following for discharge needs if higher level of care is recommended at discharge.   Kathrin Greathouse, Latanya Presser, MSW Clinical Social Worker  [phone] 11/06/2017  5:19 PM

## 2017-11-06 NOTE — Progress Notes (Signed)
   11/06/17 1200  Clinical Encounter Type  Visited With Patient  Visit Type Initial;Psychological support;Spiritual support;Critical Care  Referral From Nurse  Consult/Referral To Chaplain  Spiritual Encounters  Spiritual Needs Prayer;Emotional;Other (Comment) (Spiritual Conversation/Support)  Stress Factors  Patient Stress Factors Health changes   I visited with the patient per Spiritual Care consult requesting prayer. The patient stated that she has good support from her two sons, and that she has good support from her fellow residents at [facility].  The patient requested that we pray. I prayed at the bedside.  There were no pressing needs at this time.  Please, contact Spiritual Care for further assistance.   [place] M.Div.

## 2017-11-06 NOTE — Progress Notes (Signed)
Progress Note   Subjective  Chief Complaint:Rectal Bleeding  Today, the patient is found sitting up in bed. She had two episodes of bleeding yesterday and none since 6 pm last night, but then had another episode of "dark blood" this morning just prior to my arrival. Her son is by her side and ask what the next steps will be. She denies any new complaints.    Objective   Vital signs in last 24 hours: Temp:  [97.7 F (36.5 C)-98.8 F (37.1 C)] 98.7 F (37.1 C) (11/21 0800) Pulse Rate:  [78-103] 92 (11/21 0800) Resp:  [16-29] 20 (11/21 0800) BP: (86-135)/(21-62) 124/55 (11/21 0800) SpO2:  [90 %-100 %] 96 % (11/21 0800) Last BM Date: 11/05/17 General:    Elderly Caucasian female in NAD Heart:  Regular rate and rhythm; no murmurs Lungs: Respirations even and unlabored, lungs CTA bilaterally Abdomen:  Soft, nontender and nondistended. Normal bowel sounds. Extremities:  Without edema. Neurologic:  Alert and oriented,  grossly normal neurologically. Psych:  Cooperative. Normal mood and affect.  Intake/Output from previous day: 11/20 0701 - 11/21 0700 In: 2468.3 [P.O.:720; I.V.:1478.3; Blood:270] Out: 1678 [Urine:1677; Stool:1] Intake/Output this shift: Total I/O In: 72.5 [I.V.:72.5] Out: 700 [Urine:700]  Lab Results: Recent Labs    11/03/17 1818  11/05/17 1206 11/05/17 1742 11/06/17 0003  WBC 14.1*  --   --   --   --   HGB 10.3*   < > 8.5* 7.9* 7.1*  HCT 31.6*   < > 24.8* 22.8* 20.8*  PLT 213  --   --   --   --    < > = values in this interval not displayed.   BMET Recent Labs    11/03/17 1818 11/03/17 1829 11/03/17 2039 11/05/17 0346  NA 136 138 137 135  K 3.7 3.6 3.7 3.9  CL 103 101 103 109  CO2 25  --   --  22  GLUCOSE 103* 102* 146* 119*  BUN 23* 23* 26* 34*  CREATININE 0.89 0.90 0.90 0.84  CALCIUM 9.2  --   --  8.2*   LFT Recent Labs    11/03/17 1818  PROT 6.5  ALBUMIN 3.1*  AST 20  ALT 13*  ALKPHOS 78  BILITOT 0.4   PT/INR Recent Labs      11/03/17 1818  LABPROT 13.1  INR 1.00    Assessment / Plan:   Assessment: 1. Bright red blood per rectum: continues this morning, patient s/p 4 u prbcs so far, the last one this morning/ overnight, continue to suspect diverticular bleed, will repeat bleeding scan this morning 2. ABLA  Plan: 1. Ordered stat bleeding scan this morning, her bed transfer has been cancelled 2. Pending above may consider colonoscopy tomorrow. 3. Continue clear liquid diet 4. Spoke with Dr. Olevia Bowens who is aware of plan 5. Please await any further recs from Dr. Silverio Decamp later today.  Thank you for your kind consultation, we will continue to follow.   LOS: 3 days   Levin Erp  11/06/2017, 9:50 AM  Pager # [phone]   Attending physician's note   I have taken an interval history, reviewed the chart and examined the patient. I agree with the Advanced Practitioner's note, impression and recommendations.  Continues to have episodes of hematochezia with drop in Hgb suspicious for ongoing GI bleed, likely diverticular.  Will repeat bleeding scan Patient would like to avoid colonoscopy if she can, she doesn't want to drink the bowel prep Her  son Leroy Sea is her health care power of attorney, want him to make decisions for her if she needs to undergo any major procedure or intervention.  Damaris Hippo, MD [phone] Mon-Fri 8a-5p [phone] after 5p, weekends, holidays

## 2017-11-06 NOTE — Plan of Care (Signed)
  Nutrition: Adequate nutrition will be maintained 11/06/2017 1314 - Progressing by Dorene Sorrow, RN   Pain Managment: General experience of comfort will improve 11/06/2017 1314 - Progressing by Dorene Sorrow, RN   Safety: Ability to remain free from injury will improve 11/06/2017 1314 - Progressing by Dorene Sorrow, RN

## 2017-11-07 DIAGNOSIS — K922 Gastrointestinal hemorrhage, unspecified: Secondary | ICD-10-CM

## 2017-11-07 DIAGNOSIS — K5731 Diverticulosis of large intestine without perforation or abscess with bleeding: Secondary | ICD-10-CM

## 2017-11-07 LAB — TYPE AND SCREEN
ABO/RH(D): B POS
ANTIBODY SCREEN: NEGATIVE
UNIT DIVISION: 0
Unit division: 0
Unit division: 0
Unit division: 0

## 2017-11-07 LAB — BPAM RBC
BLOOD PRODUCT EXPIRATION DATE: 201812072359
BLOOD PRODUCT EXPIRATION DATE: 201812132359
Blood Product Expiration Date: 201811282359
Blood Product Expiration Date: 201812132359
ISSUE DATE / TIME: 201811210248
ISSUE DATE / TIME: 201811190304
ISSUE DATE / TIME: 201811190636
ISSUE DATE / TIME: 201811191355
UNIT TYPE AND RH: 7300
UNIT TYPE AND RH: 7300
Unit Type and Rh: 7300
Unit Type and Rh: 7300

## 2017-11-07 LAB — GLUCOSE, CAPILLARY
GLUCOSE-CAPILLARY: 91 mg/dL (ref 65–99)
GLUCOSE-CAPILLARY: 89 mg/dL (ref 65–99)

## 2017-11-07 LAB — HEMOGLOBIN: HEMOGLOBIN: 7.6 g/dL — AB (ref 12.0–15.0)

## 2017-11-07 LAB — PREPARE RBC (CROSSMATCH)

## 2017-11-07 LAB — HEMOGLOBIN AND HEMATOCRIT, BLOOD
HCT: 25.4 % — ABNORMAL LOW (ref 36.0–46.0)
HEMOGLOBIN: 8.9 g/dL — AB (ref 12.0–15.0)

## 2017-11-07 MED ORDER — SODIUM CHLORIDE 0.9 % IV SOLN
Freq: Once | INTRAVENOUS | Status: AC
  Administered 2017-11-07: 10:00:00 via INTRAVENOUS

## 2017-11-07 NOTE — H&P (View-Only) (Signed)
Progress Note   Subjective  Chief Complaint: Hematochezia  This morning patient reports that she had "a couple" of melenic bowel movements yesterday, the last around 8 PM per nursing staff.  Patient did well overnight but after eating breakfast this morning she then continued with a "bright red at first which turned to maroon" stool which lasted for about 5 minutes per nursing and patient and was "quite a lot".  Patient denies any new complaints and tells me that she slept well last night after taking a Tylenol.    Objective   Vital signs in last 24 hours: Temp:  [97.4 F (36.3 C)-98.7 F (37.1 C)] 97.4 F (36.3 C) (11/22 0800) Pulse Rate:  [85-97] 97 (11/22 1022) Resp:  [16-29] 25 (11/22 1022) BP: (93-143)/(34-59) 107/34 (11/22 1022) SpO2:  [88 %-100 %] 100 % (11/22 1022) Last BM Date: 11/06/17 General:  Caucasian female in NAD Heart:  Regular rate and rhythm; no murmurs Lungs: Respirations even and unlabored, lungs CTA bilaterally Abdomen:  Soft, nontender and nondistended. Normal bowel sounds. Extremities:  Without edema. Neurologic:  Alert and oriented,  grossly normal neurologically. Psych:  Cooperative. Normal mood and affect.  Intake/Output from previous day: 11/21 0701 - 11/22 0700 In: 288.3 [I.V.:288.3] Out: 780 [Urine:700; Stool:80]  Lab Results: Recent Labs    11/05/17 1742 11/06/17 0003 11/06/17 0809 11/06/17 1910 11/07/17 0704  HGB 7.9* 7.1* 9.0* 8.0* 7.6*  HCT 22.8* 20.8* 26.2*  --   --    BMET Recent Labs    11/05/17 0346  NA 135  K 3.9  CL 109  CO2 22  GLUCOSE 119*  BUN 34*  CREATININE 0.84  CALCIUM 8.2*    Studies/Results: Nm Gi Blood Loss  Result Date: 11/06/2017 CLINICAL DATA:  Dark bloody stools EXAM: NUCLEAR MEDICINE GASTROINTESTINAL BLEEDING SCAN TECHNIQUE: Sequential abdominal images were obtained following intravenous administration of Tc-58mlabeled red blood cells. RADIOPHARMACEUTICALS:  21.1 mCi Tc-937mn-vitro labeled red  cells. COMPARISON:  11/03/2017 FINDINGS: Physiologic uptake in the liver and spleen. Excretory uptake in the bladder. Vascular activity in the aorta and bilateral iliac arteries. No findings to suggest active GI bleeding. IMPRESSION: No findings to suggest active GI bleeding. Electronically Signed   By: SrJulian Hy.D.   On: 11/06/2017 15:17       Assessment / Plan:   Assessment: 1.  Hematochezia: last this morning around 10 am, large amount of maroon stool with some BRB per nursing staff, planning for another unit PRBC's today making this 5 units since admission; Dr. NaSilverio Decampould like to r/o upper gi bleed and plans for EGD tomorrow 2.  Acute blood loss anemia  Plan: 1.  We will keep patient n.p.o. after midnight with plans for an EGD in the morning to rule out upper GI bleed.  Discussed risk, benefits, limitations and alternatives the patient agrees to proceed. 2.  Continue to monitor hemoglobin with transfusion less than 7 3.  Patient may remain on her current diet today 4.  Please await final recommendations from Dr. NaSilverio Decampater today.  Thank you for your kind consultation, we will continue to follow.    LOS: 4 days   JeLevin Erp11/22/2018, 11:22 AM  Pager # [phone] Attending physician's note   I have taken a history, examined the patient and reviewed the chart. I agree with the Advanced Practitioner's note, impression and recommendations.  Continues to have episodes of dark maroon stool.  Nuclear medicine RBC bleeding scan  negativeX2.  Presentation is consistent with diverticular hemorrhage but will also need to exclude upper GI hemorrhage given ongoing symptoms. Proceed with EGD tomorrow NPO after midnight Patient is reluctant to drink the bowel prep, hold off colonoscopy for now The risks and benefits as well as alternatives of endoscopic procedure(s) have been discussed and reviewed. All questions answered. The patient agrees to proceed.  Damaris Hippo, MD [phone] Mon-Fri 8a-5p [phone] after 5p, weekends, holidays

## 2017-11-07 NOTE — Progress Notes (Addendum)
[facility] PROGRESS NOTE    Progress Note  [name]  LFY:[id number] DOB: 07-Jun-1921 DOA: 11/03/2017 PCP: Blanchie Serve, MD     Brief Narrative:   [name] is an 83 y.o. female past medical history of diverticular bleed in 2012 presents with hematochezia started on the day of admission GI has been consulted  Assessment/Plan:   Hematochezia/Acute blood loss anemia: Likely a diverticular bleed. Hemoglobin on 10/09/2017 was 12.6, She's had gotten a total of 5 units.  Had another episode of hematochezia, bleeding scan done was negative. Hemoglobin to this am is 7.6  it continues to drift down, will transfuse an additional unit of packed red blood cells check her hemoglobin posttransfusion oh. Last bloody bowel movement was around 8 PM on 11/06/2017, I explained to the patient and his son the plan going forward and he seems to understand. We'll await GI recommendations   DVT prophylaxis: SCD Family Communication:none Disposition Plan/Barrier to D/C: keep in SDU Code Status:     Code Status Orders  (From admission, onward)        Start     Ordered   11/03/17 2152  Limited resuscitation (code)  Continuous    Question Answer Comment  In the event of cardiac or respiratory ARREST: Initiate Code Blue, Call Rapid Response Yes   In the event of cardiac or respiratory ARREST: Perform CPR No   In the event of cardiac or respiratory ARREST: Perform Intubation/Mechanical Ventilation Yes   In the event of cardiac or respiratory ARREST: Use NIPPV/BiPAp only if indicated Yes   In the event of cardiac or respiratory ARREST: Administer ACLS medications if indicated Yes   In the event of cardiac or respiratory ARREST: Perform Defibrillation or Cardioversion if indicated Yes      11/03/17 2152    Code Status History    Date Active Date Inactive Code Status Order ID Comments User Context   This patient has a current code status but no historical code status.        IV  Access:    Peripheral IV   Procedures and diagnostic studies:   Nm Gi Blood Loss  Result Date: 11/06/2017 CLINICAL DATA:  Dark bloody stools EXAM: NUCLEAR MEDICINE GASTROINTESTINAL BLEEDING SCAN TECHNIQUE: Sequential abdominal images were obtained following intravenous administration of Tc-42mlabeled red blood cells. RADIOPHARMACEUTICALS:  21.1 mCi Tc-924mn-vitro labeled red cells. COMPARISON:  11/03/2017 FINDINGS: Physiologic uptake in the liver and spleen. Excretory uptake in the bladder. Vascular activity in the aorta and bilateral iliac arteries. No findings to suggest active GI bleeding. IMPRESSION: No findings to suggest active GI bleeding. Electronically Signed   By: SrJulian Hy.D.   On: 11/06/2017 15:17     Medical Consultants:    None.  Anti-Infectives:   None  Subjective:    [name] denies any abdominal pain, last bloody bowel movement was around 8 PM yesterday night  Objective:    Vitals:   11/06/17 2000 11/06/17 2255 11/07/17 0000 11/07/17 0345  BP:  (!) 93/48  (!) 106/44  Pulse:  87  85  Resp:  (!) 26  16  Temp: 98.1 F (36.7 C)  98.2 F (36.8 C) 98.5 F (36.9 C)  TempSrc: Oral  Oral Oral  SpO2:  (!) 88%  97%  Weight:      Height:        Intake/Output Summary (Last 24 hours) at 11/07/2017 0719 Last data filed at 11/07/2017 0600 Gross per 24 hour  Intake 288.33 ml  Output 780 ml  Net -491.67 ml   Filed Weights   11/03/17 2233  Weight: 70.4 kg (155 lb 3.3 oz)    Exam: General exam: In no acute distress. Respiratory system: Good air movement clear to auscultation. Cardiovascular system: Regular with positive S1-S2. Gastrointestinal system: Positive bowel sounds soft nontender nondistended Central nervous system: Awake alert and oriented 3 Extremities: No extremity edema. Skin: No rashes, lesions or ulcers Psychiatry: Judgement and insight appear normal. Mood & affect appropriate.    Data Reviewed:    Labs: Basic  Metabolic Panel: Recent Labs  Lab 11/03/17 1818 11/03/17 1829 11/03/17 2039 11/05/17 0346  NA 136 138 137 135  K 3.7 3.6 3.7 3.9  CL 103 101 103 109  CO2 25  --   --  22  GLUCOSE 103* 102* 146* 119*  BUN 23* 23* 26* 34*  CREATININE 0.89 0.90 0.90 0.84  CALCIUM 9.2  --   --  8.2*   GFR Estimated Creatinine Clearance: 38.5 mL/min (by C-G formula based on SCr of 0.84 mg/dL). Liver Function Tests: Recent Labs  Lab 11/03/17 1818  AST 20  ALT 13*  ALKPHOS 78  BILITOT 0.4  PROT 6.5  ALBUMIN 3.1*   No results for input(s): LIPASE, AMYLASE in the last 168 hours. No results for input(s): AMMONIA in the last 168 hours. Coagulation profile Recent Labs  Lab 11/03/17 1818  INR 1.00    CBC: Recent Labs  Lab 11/03/17 1818  11/05/17 0635 11/05/17 1206 11/05/17 1742 11/06/17 0003 11/06/17 0809 11/06/17 1910 11/07/17 0704  WBC 14.1*  --   --   --   --   --   --   --   --   HGB 10.3*   < > 9.0* 8.5* 7.9* 7.1* 9.0* 8.0* 7.6*  HCT 31.6*   < > 25.3* 24.8* 22.8* 20.8* 26.2*  --   --   MCV 95.5  --   --   --   --   --   --   --   --   PLT 213  --   --   --   --   --   --   --   --    < > = values in this interval not displayed.   Cardiac Enzymes: No results for input(s): CKTOTAL, CKMB, CKMBINDEX, TROPONINI in the last 168 hours. BNP (last 3 results) No results for input(s): PROBNP in the last 8760 hours. CBG: Recent Labs  Lab 11/05/17 1600 11/05/17 2001 11/05/17 2338 11/06/17 0309 11/06/17 0759  GLUCAP 84 116* 100* 101* 94   D-Dimer: No results for input(s): DDIMER in the last 72 hours. Hgb A1c: No results for input(s): HGBA1C in the last 72 hours. Lipid Profile: No results for input(s): CHOL, HDL, LDLCALC, TRIG, CHOLHDL, LDLDIRECT in the last 72 hours. Thyroid function studies: No results for input(s): TSH, T4TOTAL, T3FREE, THYROIDAB in the last 72 hours.  Invalid input(s): FREET3 Anemia work up: No results for input(s): VITAMINB12, FOLATE, FERRITIN, TIBC,  IRON, RETICCTPCT in the last 72 hours. Sepsis Labs: Recent Labs  Lab 11/03/17 1818  WBC 14.1*   Microbiology Recent Results (from the past 240 hour(s))  MRSA PCR Screening     Status: None   Collection Time: 11/03/17 10:10 PM  Result Value Ref Range Status   MRSA by PCR NEGATIVE NEGATIVE Final    Comment:        The GeneXpert MRSA Assay (FDA approved for NASAL  specimens only), is one component of a comprehensive MRSA colonization surveillance program. It is not intended to diagnose MRSA infection nor to guide or monitor treatment for MRSA infections.      Medications:   . dorzolamide-timolol  1 drop Both Eyes BID  . DULoxetine  20 mg Oral Daily  . folic acid  611 mcg Oral Daily  . latanoprost  1 drop Both Eyes QHS  . loratadine  10 mg Oral Daily  . pantoprazole (PROTONIX) IV  40 mg Intravenous Q24H  . vitamin B-12  1,000 mcg Oral Daily   Continuous Infusions: . sodium chloride    . dextrose 5 % and 0.9 % NaCl with KCl 20 mEq/L 10 mL/hr at 11/07/17 0600      LOS: 4 days   [facility] Pager [phone]  *Please refer to [email or web address], password [email or web address] to get updated schedule on who will round on this patient, as hospitalists switch teams weekly. If 7PM-7AM, please contact night-coverage at [email or web address], password [email or web address] for any overnight needs.  11/07/2017, 7:19 AM

## 2017-11-07 NOTE — Progress Notes (Signed)
Progress Note   Subjective  Chief Complaint: Hematochezia  This morning patient reports that she had "a couple" of melenic bowel movements yesterday, the last around 8 PM per nursing staff.  Patient did well overnight but after eating breakfast this morning she then continued with a "bright red at first which turned to maroon" stool which lasted for about 5 minutes per nursing and patient and was "quite a lot".  Patient denies any new complaints and tells me that she slept well last night after taking a Tylenol.    Objective   Vital signs in last 24 hours: Temp:  [97.4 F (36.3 C)-98.7 F (37.1 C)] 97.4 F (36.3 C) (11/22 0800) Pulse Rate:  [85-97] 97 (11/22 1022) Resp:  [16-29] 25 (11/22 1022) BP: (93-143)/(34-59) 107/34 (11/22 1022) SpO2:  [88 %-100 %] 100 % (11/22 1022) Last BM Date: 11/06/17 General:  Caucasian female in NAD Heart:  Regular rate and rhythm; no murmurs Lungs: Respirations even and unlabored, lungs CTA bilaterally Abdomen:  Soft, nontender and nondistended. Normal bowel sounds. Extremities:  Without edema. Neurologic:  Alert and oriented,  grossly normal neurologically. Psych:  Cooperative. Normal mood and affect.  Intake/Output from previous day: 11/21 0701 - 11/22 0700 In: 288.3 [I.V.:288.3] Out: 780 [Urine:700; Stool:80]  Lab Results: Recent Labs    11/05/17 1742 11/06/17 0003 11/06/17 0809 11/06/17 1910 11/07/17 0704  HGB 7.9* 7.1* 9.0* 8.0* 7.6*  HCT 22.8* 20.8* 26.2*  --   --    BMET Recent Labs    11/05/17 0346  NA 135  K 3.9  CL 109  CO2 22  GLUCOSE 119*  BUN 34*  CREATININE 0.84  CALCIUM 8.2*    Studies/Results: Nm Gi Blood Loss  Result Date: 11/06/2017 CLINICAL DATA:  Dark bloody stools EXAM: NUCLEAR MEDICINE GASTROINTESTINAL BLEEDING SCAN TECHNIQUE: Sequential abdominal images were obtained following intravenous administration of Tc-91mlabeled red blood cells. RADIOPHARMACEUTICALS:  21.1 mCi Tc-922mn-vitro labeled red  cells. COMPARISON:  11/03/2017 FINDINGS: Physiologic uptake in the liver and spleen. Excretory uptake in the bladder. Vascular activity in the aorta and bilateral iliac arteries. No findings to suggest active GI bleeding. IMPRESSION: No findings to suggest active GI bleeding. Electronically Signed   By: SrJulian Hy.D.   On: 11/06/2017 15:17       Assessment / Plan:   Assessment: 1.  Hematochezia: last this morning around 10 am, large amount of maroon stool with some BRB per nursing staff, planning for another unit PRBC's today making this 5 units since admission; Dr. NaSilverio Decampould like to r/o upper gi bleed and plans for EGD tomorrow 2.  Acute blood loss anemia  Plan: 1.  We will keep patient n.p.o. after midnight with plans for an EGD in the morning to rule out upper GI bleed.  Discussed risk, benefits, limitations and alternatives the patient agrees to proceed. 2.  Continue to monitor hemoglobin with transfusion less than 7 3.  Patient may remain on her current diet today 4.  Please await final recommendations from Dr. NaSilverio Decampater today.  Thank you for your kind consultation, we will continue to follow.    LOS: 4 days   JeLevin Erp11/22/2018, 11:22 AM  Pager # [phone] Attending physician's note   I have taken a history, examined the patient and reviewed the chart. I agree with the Advanced Practitioner's note, impression and recommendations.  Continues to have episodes of dark maroon stool.  Nuclear medicine RBC bleeding scan  negativeX2.  Presentation is consistent with diverticular hemorrhage but will also need to exclude upper GI hemorrhage given ongoing symptoms. Proceed with EGD tomorrow NPO after midnight Patient is reluctant to drink the bowel prep, hold off colonoscopy for now The risks and benefits as well as alternatives of endoscopic procedure(s) have been discussed and reviewed. All questions answered. The patient agrees to proceed.  Damaris Hippo, MD [phone] Mon-Fri 8a-5p [phone] after 5p, weekends, holidays

## 2017-11-08 ENCOUNTER — Encounter (HOSPITAL_COMMUNITY): Payer: Self-pay | Admitting: *Deleted

## 2017-11-08 ENCOUNTER — Encounter (HOSPITAL_COMMUNITY)
Admission: EM | Disposition: A | Discharge status: Skilled Nursing Facility | Payer: Self-pay | Source: Home / Self Care | Attending: Internal Medicine

## 2017-11-08 ENCOUNTER — Inpatient Hospital Stay (HOSPITAL_COMMUNITY): Payer: Medicare HMO | Admitting: Anesthesiology

## 2017-11-08 HISTORY — PX: ESOPHAGOGASTRODUODENOSCOPY (EGD) WITH PROPOFOL: SHX5813

## 2017-11-08 LAB — HEMOGLOBIN
HEMOGLOBIN: 8.6 g/dL — AB (ref 12.0–15.0)
Hemoglobin: 7.9 g/dL — ABNORMAL LOW (ref 12.0–15.0)

## 2017-11-08 SURGERY — ESOPHAGOGASTRODUODENOSCOPY (EGD) WITH PROPOFOL
Anesthesia: Monitor Anesthesia Care

## 2017-11-08 MED ORDER — PEG-KCL-NACL-NASULF-NA ASC-C 100 G PO SOLR: 1.0000 | Freq: Once | ORAL | Status: DC

## 2017-11-08 MED ORDER — METOCLOPRAMIDE HCL 5 MG/ML IJ SOLN
5.0000 mg | Freq: Two times a day (BID) | INTRAMUSCULAR | Status: AC
  Administered 2017-11-08 (×2): 5 mg via INTRAVENOUS
  Filled 2017-11-08 (×2): qty 2

## 2017-11-08 MED ORDER — METOPROLOL TARTRATE 5 MG/5ML IV SOLN
INTRAVENOUS | Status: DC | PRN
  Administered 2017-11-08: 1 mg via INTRAVENOUS

## 2017-11-08 MED ORDER — SODIUM CHLORIDE 0.9 % IV SOLN: INTRAVENOUS | Status: DC

## 2017-11-08 MED ORDER — LACTATED RINGERS IV SOLN
INTRAVENOUS | Status: DC | PRN
  Administered 2017-11-08: 11:00:00 via INTRAVENOUS

## 2017-11-08 MED ORDER — PEG-KCL-NACL-NASULF-NA ASC-C 100 G PO SOLR: 0.5000 | Freq: Once | ORAL | Status: DC

## 2017-11-08 MED ORDER — PROPOFOL 500 MG/50ML IV EMUL
INTRAVENOUS | Status: DC | PRN
  Administered 2017-11-08: 20 mg via INTRAVENOUS

## 2017-11-08 MED ORDER — PEG-KCL-NACL-NASULF-NA ASC-C 100 G PO SOLR
0.5000 | Freq: Once | ORAL | Status: AC
  Administered 2017-11-08: 100 g via ORAL
  Filled 2017-11-08: qty 1

## 2017-11-08 MED ORDER — PEG-KCL-NACL-NASULF-NA ASC-C 100 G PO SOLR
0.5000 | Freq: Once | ORAL | Status: AC
  Administered 2017-11-08: 100 g via ORAL

## 2017-11-08 MED ORDER — PROPOFOL 500 MG/50ML IV EMUL
INTRAVENOUS | Status: DC | PRN
  Administered 2017-11-08: 50 ug/kg/min via INTRAVENOUS

## 2017-11-08 MED ORDER — PEG-KCL-NACL-NASULF-NA ASC-C 100 G PO SOLR
0.5000 | Freq: Once | ORAL | Status: DC
  Filled 2017-11-08: qty 1

## 2017-11-08 SURGICAL SUPPLY — 15 items

## 2017-11-08 NOTE — Anesthesia Postprocedure Evaluation (Signed)
Anesthesia Post Note  Patient: [name]  Procedure(s) Performed: ESOPHAGOGASTRODUODENOSCOPY (EGD) WITH PROPOFOL (N/A )     Patient location during evaluation: PACU Anesthesia Type: MAC Level of consciousness: awake and alert Pain management: pain level controlled Vital Signs Assessment: post-procedure vital signs reviewed and stable Respiratory status: spontaneous breathing, nonlabored ventilation, respiratory function stable and patient connected to nasal cannula oxygen Cardiovascular status: stable and blood pressure returned to baseline Postop Assessment: no apparent nausea or vomiting Anesthetic complications: no    Last Vitals:  Vitals:   11/08/17 1210 11/08/17 1215  BP: 135/64   Pulse:    Resp: (!) 21 19  Temp:    SpO2:      Last Pain:  Vitals:   11/08/17 1151  TempSrc: Oral  PainSc:                  Effie Berkshire

## 2017-11-08 NOTE — Progress Notes (Signed)
[facility] PROGRESS NOTE    Progress Note  [name]  JWJ:[id number] DOB: [birth year] DOA: 11/03/2017 PCP: Blanchie Serve, MD     Brief Narrative:   [name] is an 83 y.o. female past medical history of diverticular bleed in 2012 presents with hematochezia started on the day of admission GI has been consulted  Assessment/Plan:   Hematochezia/Acute blood loss anemia: Likely a diverticular bleed. Hemoglobin on 10/09/2017 was 12.6. She status post an additional unit of packed red blood cells GI recommended an EGD. Continue SCDs. Continue check H&H every 12 hours.   DVT prophylaxis: SCD Family Communication:none Disposition Plan/Barrier to D/C: keep in SDU Code Status:     Code Status Orders  (From admission, onward)        Start     Ordered   11/03/17 2152  Limited resuscitation (code)  Continuous    Question Answer Comment  In the event of cardiac or respiratory ARREST: Initiate Code Blue, Call Rapid Response Yes   In the event of cardiac or respiratory ARREST: Perform CPR No   In the event of cardiac or respiratory ARREST: Perform Intubation/Mechanical Ventilation Yes   In the event of cardiac or respiratory ARREST: Use NIPPV/BiPAp only if indicated Yes   In the event of cardiac or respiratory ARREST: Administer ACLS medications if indicated Yes   In the event of cardiac or respiratory ARREST: Perform Defibrillation or Cardioversion if indicated Yes      11/03/17 2152    Code Status History    Date Active Date Inactive Code Status Order ID Comments User Context   This patient has a current code status but no historical code status.        IV Access:    Peripheral IV   Procedures and diagnostic studies:   Nm Gi Blood Loss  Result Date: 11/06/2017 CLINICAL DATA:  Dark bloody stools EXAM: NUCLEAR MEDICINE GASTROINTESTINAL BLEEDING SCAN TECHNIQUE: Sequential abdominal images were obtained following intravenous administration of Tc-39mlabeled  red blood cells. RADIOPHARMACEUTICALS:  21.1 mCi Tc-922mn-vitro labeled red cells. COMPARISON:  11/03/2017 FINDINGS: Physiologic uptake in the liver and spleen. Excretory uptake in the bladder. Vascular activity in the aorta and bilateral iliac arteries. No findings to suggest active GI bleeding. IMPRESSION: No findings to suggest active GI bleeding. Electronically Signed   By: SrJulian Hy.D.   On: 11/06/2017 15:17     Medical Consultants:    None.  Anti-Infectives:   None  Subjective:    [name] denies any abdominal pain. Her last bloody bowel movement was 24 hours ago.  Objective:    Vitals:   11/07/17 2300 11/08/17 0030 11/08/17 0300 11/08/17 0349  BP: (!) 126/48  (!) 101/33   Pulse: 85  78   Resp: 18  (!) 22   Temp:  98.4 F (36.9 C)  98.7 F (37.1 C)  TempSrc:  Oral  Oral  SpO2: 95%  93%   Weight:      Height:        Intake/Output Summary (Last 24 hours) at 11/08/2017 0729 Last data filed at 11/08/2017 0400 Gross per 24 hour  Intake 535 ml  Output 900 ml  Net -365 ml   Filed Weights   11/03/17 2233  Weight: 70.4 kg (155 lb 3.3 oz)    Exam: General exam: In no acute distress. Respiratory system: Good air movement clear to auscultation. Cardiovascular system: Regular with positive S1-S2. Gastrointestinal system: Positive bowel sounds soft nontender  nondistended Central nervous system: Awake alert and oriented 3 Extremities: No extremity edema. Skin: No rashes, lesions or ulcers Psychiatry: Judgement and insight appear normal. Mood & affect appropriate.    Data Reviewed:    Labs: Basic Metabolic Panel: Recent Labs  Lab 11/03/17 1818 11/03/17 1829 11/03/17 2039 11/05/17 0346  NA 136 138 137 135  K 3.7 3.6 3.7 3.9  CL 103 101 103 109  CO2 25  --   --  22  GLUCOSE 103* 102* 146* 119*  BUN 23* 23* 26* 34*  CREATININE 0.89 0.90 0.90 0.84  CALCIUM 9.2  --   --  8.2*   GFR Estimated Creatinine Clearance: 38.5 mL/min (by C-G  formula based on SCr of 0.84 mg/dL). Liver Function Tests: Recent Labs  Lab 11/03/17 1818  AST 20  ALT 13*  ALKPHOS 78  BILITOT 0.4  PROT 6.5  ALBUMIN 3.1*   No results for input(s): LIPASE, AMYLASE in the last 168 hours. No results for input(s): AMMONIA in the last 168 hours. Coagulation profile Recent Labs  Lab 11/03/17 1818  INR 1.00    CBC: Recent Labs  Lab 11/03/17 1818  11/05/17 1206 11/05/17 1742 11/06/17 0003 11/06/17 0809 11/06/17 1910 11/07/17 0704 11/07/17 1730 11/08/17 0653  WBC 14.1*  --   --   --   --   --   --   --   --   --   HGB 10.3*   < > 8.5* 7.9* 7.1* 9.0* 8.0* 7.6* 8.9* 7.9*  HCT 31.6*   < > 24.8* 22.8* 20.8* 26.2*  --   --  25.4*  --   MCV 95.5  --   --   --   --   --   --   --   --   --   PLT 213  --   --   --   --   --   --   --   --   --    < > = values in this interval not displayed.   Cardiac Enzymes: No results for input(s): CKTOTAL, CKMB, CKMBINDEX, TROPONINI in the last 168 hours. BNP (last 3 results) No results for input(s): PROBNP in the last 8760 hours. CBG: Recent Labs  Lab 11/05/17 2338 11/06/17 0309 11/06/17 0759 11/06/17 1122 11/06/17 1524  GLUCAP 100* 101* 94 91 89   D-Dimer: No results for input(s): DDIMER in the last 72 hours. Hgb A1c: No results for input(s): HGBA1C in the last 72 hours. Lipid Profile: No results for input(s): CHOL, HDL, LDLCALC, TRIG, CHOLHDL, LDLDIRECT in the last 72 hours. Thyroid function studies: No results for input(s): TSH, T4TOTAL, T3FREE, THYROIDAB in the last 72 hours.  Invalid input(s): FREET3 Anemia work up: No results for input(s): VITAMINB12, FOLATE, FERRITIN, TIBC, IRON, RETICCTPCT in the last 72 hours. Sepsis Labs: Recent Labs  Lab 11/03/17 1818  WBC 14.1*   Microbiology Recent Results (from the past 240 hour(s))  MRSA PCR Screening     Status: None   Collection Time: 11/03/17 10:10 PM  Result Value Ref Range Status   MRSA by PCR NEGATIVE NEGATIVE Final    Comment:         The GeneXpert MRSA Assay (FDA approved for NASAL specimens only), is one component of a comprehensive MRSA colonization surveillance program. It is not intended to diagnose MRSA infection nor to guide or monitor treatment for MRSA infections.      Medications:   . dorzolamide-timolol  1 drop Both  Eyes BID  . DULoxetine  20 mg Oral Daily  . folic acid  097 mcg Oral Daily  . latanoprost  1 drop Both Eyes QHS  . loratadine  10 mg Oral Daily  . pantoprazole (PROTONIX) IV  40 mg Intravenous Q24H  . vitamin B-12  1,000 mcg Oral Daily   Continuous Infusions: . sodium chloride    . dextrose 5 % and 0.9 % NaCl with KCl 20 mEq/L 10 mL/hr at 11/08/17 0400      LOS: 5 days   [facility] Pager [phone]  *Please refer to [email or web address], password [email or web address] to get updated schedule on who will round on this patient, as hospitalists switch teams weekly. If 7PM-7AM, please contact night-coverage at [email or web address], password [email or web address] for any overnight needs.  11/08/2017, 7:29 AM

## 2017-11-08 NOTE — Op Note (Signed)
[facility] Patient Name: [name] Procedure Date: 11/08/2017 MRN: [id number] Attending MD: Mauri Pole , MD Date of Birth: Jan 25, 1921 CSN: [id number] Age: 83 Admit Type: Inpatient Procedure:                Upper GI endoscopy Indications:              Active gastrointestinal bleeding, Gastrointestinal                            bleeding of unknown origin Providers:                Mauri Pole, MD, Cleda Daub, RN, Tinnie Gens, Technician, Arnoldo Hooker, CRNA Referring MD:              Medicines:                Monitored Anesthesia Care Complications:            No immediate complications. Estimated Blood Loss:     Estimated blood loss: none. Procedure:                Pre-Anesthesia Assessment:                           - Prior to the procedure, a History and Physical                            was performed, and patient medications and                            allergies were reviewed. The patient's tolerance of                            previous anesthesia was also reviewed. The risks                            and benefits of the procedure and the sedation                            options and risks were discussed with the patient.                            All questions were answered, and informed consent                            was obtained. Prior Anticoagulants: The patient has                            taken no previous anticoagulant or antiplatelet                            agents. ASA Grade Assessment: II - A patient with  mild systemic disease. After reviewing the risks                            and benefits, the patient was deemed in                            satisfactory condition to undergo the procedure.                           After obtaining informed consent, the endoscope was                            passed under direct vision. Throughout the   procedure, the patient's blood pressure, pulse, and                            oxygen saturations were monitored continuously. The                            [id number] ([id number]) scope was introduced through the                            mouth, and advanced to the second part of duodenum.                            The upper GI endoscopy was accomplished without                            difficulty. The patient tolerated the procedure                            well. Scope In: Scope Out: Findings:      The esophagus was normal.      The stomach was normal.      The examined duodenum was normal. Impression:               - Normal esophagus.                           - Normal stomach.                           - Normal examined duodenum.                           - No specimens collected. Moderate Sedation:      N/A- Per Anesthesia Care Recommendation:           - Clear liquid diet today.                           - Continue present medications.                           - Perform a colonoscopy tomorrow. Procedure Code(s):        --- Professional ---  01484, Esophagogastroduodenoscopy, flexible,                            transoral; diagnostic, including collection of                            specimen(s) by brushing or washing, when performed                            (separate procedure) Diagnosis Code(s):        --- Professional ---                           K92.2, Gastrointestinal hemorrhage, unspecified CPT copyright 2016 American Medical Association. All rights reserved. The codes documented in this report are preliminary and upon coder review may  be revised to meet current compliance requirements. Mauri Pole, MD 11/08/2017 11:47:29 AM This report has been signed electronically. Number of Addenda: 0

## 2017-11-08 NOTE — Interval H&P Note (Signed)
History and Physical Interval Note:  11/08/2017 11:31 AM  [name]  has presented today for surgery, with the diagnosis of Hematochezia  The various methods of treatment have been discussed with the patient and family. After consideration of risks, benefits and other options for treatment, the patient has consented to  Procedure(s): ESOPHAGOGASTRODUODENOSCOPY (EGD) WITH PROPOFOL (N/A) as a surgical intervention .  The patient's history has been reviewed, patient examined, no change in status, stable for surgery.  I have reviewed the patient's chart and labs.  Questions were answered to the patient's satisfaction.     Lekeith Wulf

## 2017-11-08 NOTE — Progress Notes (Signed)
To ENDO

## 2017-11-08 NOTE — Interval H&P Note (Signed)
History and Physical Interval Note:  11/08/2017 11:32 AM  [name]  has presented today for surgery, with the diagnosis of Hematochezia  The various methods of treatment have been discussed with the patient and family. After consideration of risks, benefits and other options for treatment, the patient has consented to  Procedure(s): ESOPHAGOGASTRODUODENOSCOPY (EGD) WITH PROPOFOL (N/A) as a surgical intervention .  The patient's history has been reviewed, patient examined, no change in status, stable for surgery.  I have reviewed the patient's chart and labs.  Questions were answered to the patient's satisfaction.     Kavitha Nandigam

## 2017-11-08 NOTE — Anesthesia Preprocedure Evaluation (Addendum)
Anesthesia Evaluation  Patient identified by MRN, date of birth, ID band Patient awake    Reviewed: Allergy & Precautions, NPO status , Patient's Chart, lab work & pertinent test results  Airway Mallampati: II  TM Distance: >3 FB Neck ROM: Full    Dental  (+) Dental Advisory Given   Pulmonary neg pulmonary ROS,    breath sounds clear to auscultation       Cardiovascular hypertension, Pt. on medications and Pt. on home beta blockers negative cardio ROS  + dysrhythmias Supra Ventricular Tachycardia  Rhythm:Regular Rate:Normal     Neuro/Psych PSYCHIATRIC DISORDERS Anxiety Depression  Neuromuscular disease    GI/Hepatic hiatal hernia, GERD  Medicated,(+) Hepatitis -, Unspecified  Endo/Other  negative endocrine ROSdiabetes, Type 2  Renal/GU negative Renal ROS     Musculoskeletal  (+) Arthritis ,   Abdominal   Peds  Hematology negative hematology ROS (+)   Anesthesia Other Findings Day of surgery medications reviewed with the patient.  Reproductive/Obstetrics                           Anesthesia Physical Anesthesia Plan  ASA: III  Anesthesia Plan: MAC   Post-op Pain Management:    Induction:   PONV Risk Score and Plan: 2 and Ondansetron and Propofol infusion  Airway Management Planned: Natural Airway  Additional Equipment:   Intra-op Plan:   Post-operative Plan:   Informed Consent:   Plan Discussed with:   Anesthesia Plan Comments:         Anesthesia Quick Evaluation

## 2017-11-08 NOTE — Transfer of Care (Signed)
Immediate Anesthesia Transfer of Care Note  Patient: [name]  Procedure(s) Performed: ESOPHAGOGASTRODUODENOSCOPY (EGD) WITH PROPOFOL (N/A )  Patient Location: PACU  Anesthesia Type:MAC  Level of Consciousness: awake, alert  and oriented  Airway & Oxygen Therapy: Patient Spontanous Breathing and Patient connected to nasal cannula oxygen  Post-op Assessment: Report given to RN  Post vital signs: Reviewed and stable  Last Vitals:  Vitals:   11/08/17 0900 11/08/17 1008  BP: (!) 96/37 (!) 126/48  Pulse: 93 92  Resp: 17 19  Temp:  36.6 C  SpO2: 94% 94%    Last Pain:  Vitals:   11/08/17 1008  TempSrc: Oral  PainSc:          Complications: No apparent anesthesia complications

## 2017-11-08 NOTE — Anesthesia Preprocedure Evaluation (Addendum)
Anesthesia Evaluation  Patient identified by MRN, date of birth, ID band Patient awake    Reviewed: Allergy & Precautions, NPO status , Patient's Chart, lab work & pertinent test results, reviewed documented beta blocker date and time   History of Anesthesia Complications Negative for: history of anesthetic complications  Airway Mallampati: I  TM Distance: >3 FB Neck ROM: Full    Dental  (+) Dental Advisory Given, Caps   Pulmonary  Lung cancer   breath sounds clear to auscultation       Cardiovascular hypertension, Pt. on medications and Pt. on home beta blockers (-) angina+ dysrhythmias Supra Ventricular Tachycardia  Rhythm:Regular Rate:Normal     Neuro/Psych Anxiety Depression negative neurological ROS     GI/Hepatic Neg liver ROS, GERD  Medicated and Controlled,  Endo/Other  Diabetes: pt unaware, takes no medications for her blood sugar.  Renal/GU negative Renal ROS     Musculoskeletal   Abdominal   Peds  Hematology  (+) Blood dyscrasia (Hb 8.6), anemia ,   Anesthesia Other Findings H/o breast cancer  Reproductive/Obstetrics                            Anesthesia Physical Anesthesia Plan  ASA: III  Anesthesia Plan: MAC   Post-op Pain Management:    Induction:   PONV Risk Score and Plan: 2 and Propofol infusion and Ondansetron  Airway Management Planned: Natural Airway and Nasal Cannula  Additional Equipment:   Intra-op Plan:   Post-operative Plan:   Informed Consent: I have reviewed the patients History and Physical, chart, labs and discussed the procedure including the risks, benefits and alternatives for the proposed anesthesia with the patient or authorized representative who has indicated his/her understanding and acceptance.   Dental advisory given  Plan Discussed with: CRNA and Surgeon  Anesthesia Plan Comments: (Plan routine monitors, MAC)         Anesthesia Quick Evaluation

## 2017-11-09 ENCOUNTER — Inpatient Hospital Stay (HOSPITAL_COMMUNITY): Payer: Medicare HMO | Admitting: Certified Registered Nurse Anesthetist

## 2017-11-09 ENCOUNTER — Encounter (HOSPITAL_COMMUNITY): Payer: Self-pay | Admitting: *Deleted

## 2017-11-09 ENCOUNTER — Encounter (HOSPITAL_COMMUNITY)
Admission: EM | Disposition: A | Discharge status: Skilled Nursing Facility | Payer: Self-pay | Source: Home / Self Care | Attending: Internal Medicine

## 2017-11-09 DIAGNOSIS — K648 Other hemorrhoids: Secondary | ICD-10-CM

## 2017-11-09 DIAGNOSIS — K921 Melena: Secondary | ICD-10-CM

## 2017-11-09 DIAGNOSIS — R21 Rash and other nonspecific skin eruption: Secondary | ICD-10-CM

## 2017-11-09 DIAGNOSIS — K5731 Diverticulosis of large intestine without perforation or abscess with bleeding: Principal | ICD-10-CM

## 2017-11-09 HISTORY — PX: COLONOSCOPY WITH PROPOFOL: SHX5780

## 2017-11-09 LAB — GLUCOSE, CAPILLARY: GLUCOSE-CAPILLARY: 80 mg/dL (ref 65–99)

## 2017-11-09 LAB — HEMOGLOBIN
HEMOGLOBIN: 7.2 g/dL — AB (ref 12.0–15.0)
Hemoglobin: 7.4 g/dL — ABNORMAL LOW (ref 12.0–15.0)

## 2017-11-09 SURGERY — COLONOSCOPY WITH PROPOFOL
Anesthesia: Monitor Anesthesia Care

## 2017-11-09 MED ORDER — PROPOFOL 10 MG/ML IV BOLUS
INTRAVENOUS | Status: AC
  Filled 2017-11-09: qty 20

## 2017-11-09 MED ORDER — ONDANSETRON HCL 4 MG/2ML IJ SOLN
INTRAMUSCULAR | Status: DC | PRN
  Administered 2017-11-09: 4 mg via INTRAVENOUS

## 2017-11-09 MED ORDER — LACTATED RINGERS IV SOLN
INTRAVENOUS | Status: DC | PRN
  Administered 2017-11-09: 08:00:00 via INTRAVENOUS

## 2017-11-09 MED ORDER — PROPOFOL 10 MG/ML IV BOLUS
INTRAVENOUS | Status: AC
  Filled 2017-11-09: qty 40

## 2017-11-09 MED ORDER — METOPROLOL TARTRATE 5 MG/5ML IV SOLN
INTRAVENOUS | Status: AC
  Filled 2017-11-09: qty 5

## 2017-11-09 MED ORDER — ONDANSETRON HCL 4 MG/2ML IJ SOLN
INTRAMUSCULAR | Status: AC
  Filled 2017-11-09: qty 2

## 2017-11-09 MED ORDER — LACTATED RINGERS IV SOLN
INTRAVENOUS | Status: AC | PRN
  Administered 2017-11-09: 1000 mL via INTRAVENOUS

## 2017-11-09 MED ORDER — PROPOFOL 10 MG/ML IV BOLUS
INTRAVENOUS | Status: DC | PRN
  Administered 2017-11-09 (×25): 20 mg via INTRAVENOUS

## 2017-11-09 MED ORDER — EPINEPHRINE PF 1 MG/10ML IJ SOSY
PREFILLED_SYRINGE | INTRAMUSCULAR | Status: AC
  Filled 2017-11-09: qty 10

## 2017-11-09 MED ORDER — LIDOCAINE 2% (20 MG/ML) 5 ML SYRINGE
INTRAMUSCULAR | Status: DC | PRN
  Administered 2017-11-09: 40 mg via INTRAVENOUS

## 2017-11-09 MED ORDER — SODIUM CHLORIDE 0.9 % IV SOLN: INTRAVENOUS | Status: DC

## 2017-11-09 MED ORDER — METOPROLOL TARTRATE 5 MG/5ML IV SOLN
INTRAVENOUS | Status: DC | PRN
  Administered 2017-11-09: 1 mg via INTRAVENOUS

## 2017-11-09 SURGICAL SUPPLY — 22 items

## 2017-11-09 NOTE — H&P (Signed)
[facility] History and Physical   Primary Care Physician:  Blanchie Serve, MD   Reason for Procedure:   Hematochezia  Plan:    Colonoscopy with possible interventions     HPI: [name] is a 83 y.o. female with pan colonic diverticulosis with hematochezia suspicious for diverticular bleed.   Past Medical History:  Diagnosis Date  . Allergic rhinitis   . Anemia    NOS  . Anxiety   . Biceps tendon rupture    Bilateral  . Breast cancer ([place]) 2001   Left s/p lumpectomy and XRT   . Bronchiectasis ([place]) 2014   Noted on chest x-ray  . Complication of anesthesia    slow to wake up  . Cough 2014  . Depression   . Diverticulosis   . Elevated liver enzymes    Biopsy consistent with steatohepatitis  . Fundic gland polyps of stomach, benign   . GERD (gastroesophageal reflux disease) 06/2007   EGD Dr Gala Romney >sm HH, multiple fundic gland polyps  . Glaucoma 2013   both eyes  . Hemorrhoids   . Hiatal hernia   . Hx of radiation therapy 07/17/10 to 07/21/10   SRS LLL lung  . Hyperlipidemia   . Insomnia   . Low back pain    scoliosis  . Lung cancer ([place]) 05/2010   Left 2011 - rad x 3  . Lymphedema    Left arm  . Osteoarthritis   . Osteopenia 10/25/2016  . Osteoporosis, senile   . Overactive bladder   . Pneumonia    RLL with sepsis  . PSVT (paroxysmal supraventricular tachycardia) ([place])   . Rheumatic fever    Age 73  . Right thyroid nodule   . Steatohepatitis    liver bx  . Type 2 diabetes mellitus ([place])     Past Surgical History:  Procedure Laterality Date  . ABDOMINAL HYSTERECTOMY  1964  . APPENDECTOMY  1964  . BIOPSY THYROID  11/2008  . BREAST BIOPSY     X 3 w/ cystectomy  . BREAST LUMPECTOMY  2001  . CATARACT EXTRACTION Bilateral 2003   Lens implant Dr. Charise Killian  . CHOLECYSTECTOMY  1965  . COLONOSCOPY  09/11/2011   Procedure: COLONOSCOPY;  Surgeon: Dorothyann Peng, MD;  Location: [facility];  Service: Endoscopy;  Laterality: N/A;  . COLONOSCOPY   2005 /2012   Dr. Lucianne Muss- diverticulosis, ext hemorrhoids.  . CYSTOSCOPY  2007  . ESOPHAGOGASTRODUODENOSCOPY  09/11/2011   Procedure: ESOPHAGOGASTRODUODENOSCOPY (EGD);  Surgeon: Dorothyann Peng, MD;  Location: [facility];  Service: Endoscopy;  Laterality: N/A;  . LIVER BIOPSY  2008  . LUNG BIOPSY Left 06/06/2010  . REVISION TOTAL HIP ARTHROPLASTY  2007   Left  . SKIN CANCER EXCISION     nose and left lower cheek  . TONSILLECTOMY  1930  . TOTAL HIP ARTHROPLASTY Bilateral 2005 & 2007    Dr. Earl Lagos. Aline Brochure     Prior to Admission medications   Medication Sig Start Date End Date Taking? Authorizing Provider  acetaminophen (TYLENOL) 325 MG tablet Take 650 mg by mouth. Take 2 tablets every 6 hours as needed   Yes [provider]  aspirin 81 MG EC tablet Take 81 mg by mouth daily.     Yes [provider]  atenolol (TENORMIN) 25 MG tablet Take 0.5 tablets (12.5 mg total) by mouth 2 (two) times daily. 10/11/17  Yes Mast, Man X, NP  cetirizine (ZYRTEC) 10 MG tablet Take 10 mg  by mouth daily.   Yes [provider]  Dextromethorphan-Guaifenesin (ROBAFEN DM) 10-100 MG/5ML liquid Take 5 mLs by mouth. 10 ml by mouth every 6 hours as need for cough in 48 hours   Yes [provider]  dorzolamide-timolol (COSOPT) 22.3-6.8 MG/ML ophthalmic solution Place 1 drop 2 (two) times daily into both eyes. One drop in each eye twice daily    Yes [provider]  DULoxetine (CYMBALTA) 20 MG capsule TAKE (1) CAPSULE DAILY. 09/04/17  Yes Mast, Man X, NP  Ferrous Gluconate (IRON 27 PO) Take 1 tablet by mouth daily.   Yes [provider]  folic acid (FOLVITE) 010 MCG tablet Take 400 mcg by mouth daily.   Yes [provider]  lactose free nutrition (BOOST) LIQD Take 237 mLs by mouth. Serve in cup over ice once a day between meals   Yes [provider]  Multiple Vitamins-Minerals (PRESERVISION AREDS 2 PO) Take by mouth. Take one capsule twice a day  for vision   Yes [provider]  omeprazole (PRILOSEC) 20 MG capsule TAKE 1 CAPSULE EVERY DAY. 07/05/17  Yes Pandey, Mahima, MD  vitamin B-12 (CYANOCOBALAMIN) 1000 MCG tablet Take 1,000 mcg by mouth daily.   Yes [provider]  latanoprost (XALATAN) 0.005 % ophthalmic solution Place 1 drop into both eyes at bedtime.    [provider]    Current Facility-Administered Medications  Medication Dose Route Frequency Provider Last Rate Last Dose  . 0.9 %  sodium chloride infusion   Intravenous Once Alcario Drought, Jared M, DO      . 0.9 %  sodium chloride infusion   Intravenous Continuous Levin Erp, [place]      . acetaminophen (TYLENOL) tablet 650 mg  650 mg Oral Q6H PRN Etta Quill, DO   650 mg at 11/09/17 0030   Or  . acetaminophen (TYLENOL) suppository 650 mg  650 mg Rectal Q6H PRN Etta Quill, DO      . dextrose 5 % and 0.9 % NaCl with KCl 20 mEq/L infusion   Intravenous Continuous Charlynne Cousins, MD 10 mL/hr at 11/08/17 1726    . dorzolamide-timolol (COSOPT) 22.3-6.8 MG/ML ophthalmic solution 1 drop  1 drop Both Eyes BID Etta Quill, DO   1 drop at 11/08/17 2157  . DULoxetine (CYMBALTA) DR capsule 20 mg  20 mg Oral Daily Jennette Kettle M, DO   20 mg at 11/08/17 1415  . folic acid (FOLVITE) tablet 0.5 mg  500 mcg Oral Daily Jennette Kettle M, DO   0.5 mg at 11/08/17 1415  . latanoprost (XALATAN) 0.005 % ophthalmic solution 1 drop  1 drop Both Eyes QHS Jennette Kettle M, DO   1 drop at 11/08/17 2157  . loratadine (CLARITIN) tablet 10 mg  10 mg Oral Daily Jennette Kettle M, DO   10 mg at 11/08/17 1415  . ondansetron (ZOFRAN) tablet 4 mg  4 mg Oral Q6H PRN Etta Quill, DO       Or  . ondansetron [facility]) injection 4 mg  4 mg Intravenous Q6H PRN Etta Quill, DO   4 mg at 11/08/17 1135  . pantoprazole (PROTONIX) injection 40 mg  40 mg Intravenous Q24H Rosita Fire, MD   40 mg at 11/08/17 2157  . vitamin B-12 (CYANOCOBALAMIN) tablet  1,000 mcg  1,000 mcg Oral Daily Jennette Kettle M, DO   1,000 mcg at 11/08/17 1415    Allergies as of 11/03/2017 - Review Complete 11/03/2017  Allergen Reaction Noted  . Ibuprofen Hives 01/28/2007  . Naproxen  09/30/2012  . Statins Other (See Comments) 01/28/2007  . Surgilube [gyne-moistrin]  07/30/2013  . Tape Rash 03/11/2012    Family History  Problem Relation Age of Onset  . Heart failure Mother   . Osteoporosis Mother   . Hypertension Father   . Stroke Father   . Heart failure Father   . Leukemia Brother   . Heart failure Brother   . Cancer Maternal Grandmother   . Stroke Maternal Grandfather   . Cancer Unknown     Social History   Socioeconomic History  . Marital status: Widowed    Spouse name: Not on file  . Number of children: 2  . Years of education: college   . Highest education level: Not on file  Social Needs  . Financial resource strain: Not on file  . Food insecurity - worry: Not on file  . Food insecurity - inability: Not on file  . Transportation needs - medical: Not on file  . Transportation needs - non-medical: Not on file  Occupational History  . Occupation: retired Tax Comptroller: RETIRED  Tobacco Use  . Smoking status: Never Smoker  . Smokeless tobacco: Never Used  . Tobacco comment: 2nd hand-husband  Substance and Sexual Activity  . Alcohol use: No  . Drug use: No  . Sexual activity: Not Currently  Other Topics Concern  . Not on file  Social History Narrative   Lives at [place] 02/2013   2 adopted children   Was in [place], [place]   Widowed 2013   Walks with walker   Exercise: none    POA - Living Will    Review of Systems:  All other review of systems negative except as mentioned in the HPI.  Physical Exam: Vital signs in last 24 hours: Temp:  [97.5 F (36.4 C)-98.2 F (36.8 C)] 98.2 F (36.8 C) (11/24 0400) Pulse Rate:  [84-98] 87 (11/23 1205) Resp:  [15-26] 15 (11/24 0400) BP: (82-147)/(29-90) 102/36  (11/24 0400) SpO2:  [94 %-100 %] 100 % (11/23 1205) Weight:  [155 lb (70.3 kg)] 155 lb (70.3 kg) (11/23 1008) Last BM Date: 11/07/17 General:   Alert,  Well-developed, well-nourished, pleasant and cooperative in NAD Lungs:  Clear throughout to auscultation.   Heart:  Regular rate and rhythm; no murmurs, clicks, rubs,  or gallops. Abdomen:  Soft, nontender and nondistended. Normal bowel sounds.   Neuro/Psych:  Alert and cooperative. Normal mood and affect. A and O x 3   @K .Denzil Magnuson, MD [phone] Mon-Fri 8a-5p [phone] after 5p, weekends, holidays 11/09/2017 7:10 AM@

## 2017-11-09 NOTE — Progress Notes (Signed)
This note also relates to the following rows which could not be included: BP - Cannot attach notes to unvalidated device data MAP (mmHg) - Cannot attach notes to unvalidated device data ECG Heart Rate - Cannot attach notes to unvalidated device data Resp - Cannot attach notes to unvalidated device data  Cuff reposition, pt mentating well but laying on her side

## 2017-11-09 NOTE — Transfer of Care (Signed)
Immediate Anesthesia Transfer of Care Note  Patient: [name]  Procedure(s) Performed: COLONOSCOPY WITH PROPOFOL (N/A )  Patient Location: PACU and Endoscopy Unit  Anesthesia Type:MAC  Level of Consciousness: sedated  Airway & Oxygen Therapy: Patient Spontanous Breathing and Patient connected to face mask oxygen  Post-op Assessment: Report given to RN and Post -op Vital signs reviewed and stable  Post vital signs: Reviewed and stable  Last Vitals:  Vitals:   11/09/17 0400 11/09/17 0710  BP: (!) 102/36 (!) 116/47  Pulse:    Resp: 15 19  Temp: 36.8 C 36.5 C  SpO2:  94%    Last Pain:  Vitals:   11/09/17 0710  TempSrc: Oral  PainSc:          Complications: No apparent anesthesia complications

## 2017-11-09 NOTE — Anesthesia Postprocedure Evaluation (Deleted)
Anesthesia Post Note  Patient: [name]  Procedure(s) Performed: COLONOSCOPY WITH PROPOFOL (N/A )     Patient location during evaluation: PACU Anesthesia Type: MAC Level of consciousness: awake and alert Pain management: pain level controlled Vital Signs Assessment: post-procedure vital signs reviewed and stable Respiratory status: spontaneous breathing and respiratory function stable Cardiovascular status: stable Postop Assessment: no apparent nausea or vomiting Anesthetic complications: no                  Charlize Hathaway DANIEL

## 2017-11-09 NOTE — Anesthesia Procedure Notes (Signed)
Date/Time: 11/09/2017 8:05 AM Performed by: Cynda Familia, CRNA Pre-anesthesia Checklist: Patient identified, Emergency Drugs available, Suction available, Patient being monitored and Timeout performed Oxygen Delivery Method: Simple face mask Placement Confirmation: positive ETCO2 and breath sounds checked- equal and bilateral Dental Injury: Teeth and Oropharynx as per pre-operative assessment  Comments: Simple face mask for sedation

## 2017-11-09 NOTE — Progress Notes (Signed)
[facility] PROGRESS NOTE    Progress Note  [name]  WUJ:[id number] DOB: 25-Jul-1921 DOA: 11/03/2017 PCP: Blanchie Serve, MD     Brief Narrative:   [name] is an 83 y.o. female past medical history of diverticular bleed in 2012 presents with hematochezia started on the day of admission GI has been consulted  Assessment/Plan:   Hematochezia/Acute blood loss anemia: Likely a diverticular bleed. Hemoglobin on 10/09/2017 was 12.6. Hbg today is 8.6, multiple Bloody B< overnight, for colonoscopy today. S/p EGD with no source of bleeding identify. Nuclear scan negative. Continue SCDs. Continue check H&H every 12 hours.   DVT prophylaxis: SCD Family Communication:none Disposition Plan/Barrier to D/C: keep in SDU Code Status:     Code Status Orders  (From admission, onward)        Start     Ordered   11/03/17 2152  Limited resuscitation (code)  Continuous    Question Answer Comment  In the event of cardiac or respiratory ARREST: Initiate Code Blue, Call Rapid Response Yes   In the event of cardiac or respiratory ARREST: Perform CPR No   In the event of cardiac or respiratory ARREST: Perform Intubation/Mechanical Ventilation Yes   In the event of cardiac or respiratory ARREST: Use NIPPV/BiPAp only if indicated Yes   In the event of cardiac or respiratory ARREST: Administer ACLS medications if indicated Yes   In the event of cardiac or respiratory ARREST: Perform Defibrillation or Cardioversion if indicated Yes      11/03/17 2152    Code Status History    Date Active Date Inactive Code Status Order ID Comments User Context   This patient has a current code status but no historical code status.        IV Access:    Peripheral IV   Procedures and diagnostic studies:   No results found.   Medical Consultants:    None.  Anti-Infectives:   None  Subjective:    [name] she denies any abdominal pain. Multiple bloody BM  overnight.  Objective:    Vitals:   11/09/17 0200 11/09/17 0300 11/09/17 0400 11/09/17 0710  BP: (!) 82/29  (!) 102/36 (!) 116/47  Pulse:      Resp: 16 15 15 19   Temp:   98.2 F (36.8 C) 97.7 F (36.5 C)  TempSrc:   Oral Oral  SpO2:      Weight:    70.3 kg (155 lb)  Height:    5' 3"  (1.6 m)    Intake/Output Summary (Last 24 hours) at 11/09/2017 0713 Last data filed at 11/09/2017 0500 Gross per 24 hour  Intake 1671 ml  Output -  Net 1671 ml   Filed Weights   11/03/17 2233 11/08/17 1008 11/09/17 0710  Weight: 70.4 kg (155 lb 3.3 oz) 70.3 kg (155 lb) 70.3 kg (155 lb)    Exam: General exam: In no acute distress. Respiratory system: Good air movement clear to auscultation. Cardiovascular system: Regular with positive S1-S2. Gastrointestinal system: Positive bowel sounds soft nontender nondistended Central nervous system: Awake alert and oriented 3 Extremities: No extremity edema. Skin: No rashes, lesions or ulcers Psychiatry: Judgement and insight appear normal. Mood & affect appropriate.    Data Reviewed:    Labs: Basic Metabolic Panel: Recent Labs  Lab 11/03/17 1818 11/03/17 1829 11/03/17 2039 11/05/17 0346  NA 136 138 137 135  K 3.7 3.6 3.7 3.9  CL 103 101 103 109  CO2 25  --   --  22  GLUCOSE 103* 102* 146* 119*  BUN 23* 23* 26* 34*  CREATININE 0.89 0.90 0.90 0.84  CALCIUM 9.2  --   --  8.2*   GFR Estimated Creatinine Clearance: 38.5 mL/min (by C-G formula based on SCr of 0.84 mg/dL). Liver Function Tests: Recent Labs  Lab 11/03/17 1818  AST 20  ALT 13*  ALKPHOS 78  BILITOT 0.4  PROT 6.5  ALBUMIN 3.1*   No results for input(s): LIPASE, AMYLASE in the last 168 hours. No results for input(s): AMMONIA in the last 168 hours. Coagulation profile Recent Labs  Lab 11/03/17 1818  INR 1.00    CBC: Recent Labs  Lab 11/03/17 1818  11/05/17 1206 11/05/17 1742 11/06/17 0003 11/06/17 0809 11/06/17 1910 11/07/17 0704 11/07/17 1730  11/08/17 0653 11/08/17 1847  WBC 14.1*  --   --   --   --   --   --   --   --   --   --   HGB 10.3*   < > 8.5* 7.9* 7.1* 9.0* 8.0* 7.6* 8.9* 7.9* 8.6*  HCT 31.6*   < > 24.8* 22.8* 20.8* 26.2*  --   --  25.4*  --   --   MCV 95.5  --   --   --   --   --   --   --   --   --   --   PLT 213  --   --   --   --   --   --   --   --   --   --    < > = values in this interval not displayed.   Cardiac Enzymes: No results for input(s): CKTOTAL, CKMB, CKMBINDEX, TROPONINI in the last 168 hours. BNP (last 3 results) No results for input(s): PROBNP in the last 8760 hours. CBG: Recent Labs  Lab 11/05/17 2338 11/06/17 0309 11/06/17 0759 11/06/17 1122 11/06/17 1524  GLUCAP 100* 101* 94 91 89   D-Dimer: No results for input(s): DDIMER in the last 72 hours. Hgb A1c: No results for input(s): HGBA1C in the last 72 hours. Lipid Profile: No results for input(s): CHOL, HDL, LDLCALC, TRIG, CHOLHDL, LDLDIRECT in the last 72 hours. Thyroid function studies: No results for input(s): TSH, T4TOTAL, T3FREE, THYROIDAB in the last 72 hours.  Invalid input(s): FREET3 Anemia work up: No results for input(s): VITAMINB12, FOLATE, FERRITIN, TIBC, IRON, RETICCTPCT in the last 72 hours. Sepsis Labs: Recent Labs  Lab 11/03/17 1818  WBC 14.1*   Microbiology Recent Results (from the past 240 hour(s))  MRSA PCR Screening     Status: None   Collection Time: 11/03/17 10:10 PM  Result Value Ref Range Status   MRSA by PCR NEGATIVE NEGATIVE Final    Comment:        The GeneXpert MRSA Assay (FDA approved for NASAL specimens only), is one component of a comprehensive MRSA colonization surveillance program. It is not intended to diagnose MRSA infection nor to guide or monitor treatment for MRSA infections.      Medications:   . dorzolamide-timolol  1 drop Both Eyes BID  . DULoxetine  20 mg Oral Daily  . folic acid  655 mcg Oral Daily  . latanoprost  1 drop Both Eyes QHS  . loratadine  10 mg Oral Daily   . pantoprazole (PROTONIX) IV  40 mg Intravenous Q24H  . vitamin B-12  1,000 mcg Oral Daily   Continuous Infusions: . sodium chloride    .  sodium chloride    . dextrose 5 % and 0.9 % NaCl with KCl 20 mEq/L 10 mL/hr at 11/08/17 1726      LOS: 6 days   [facility] Pager [phone]  *Please refer to [email or web address], password [email or web address] to get updated schedule on who will round on this patient, as hospitalists switch teams weekly. If 7PM-7AM, please contact night-coverage at [email or web address], password [email or web address] for any overnight needs.  11/09/2017, 7:13 AM

## 2017-11-09 NOTE — Plan of Care (Signed)
Pt status improving

## 2017-11-09 NOTE — Op Note (Signed)
[facility] Patient Name: [name] Procedure Date: 11/09/2017 MRN: [id number] Attending MD: Mauri Pole , MD Date of Birth: 08-Aug-1921 CSN: [id number] Age: 83 Admit Type: Inpatient Procedure:                Colonoscopy Indications:              Hematochezia Providers:                Mauri Pole, MD, Kingsley Plan, RN,                            Alan Mulder, Technician Referring MD:              Medicines:                Monitored Anesthesia Care Complications:            No immediate complications. Estimated Blood Loss:     Estimated blood loss was minimal. Procedure:                Pre-Anesthesia Assessment:                           - Prior to the procedure, a History and Physical                            was performed, and patient medications and                            allergies were reviewed. The patient's tolerance of                            previous anesthesia was also reviewed. The risks                            and benefits of the procedure and the sedation                            options and risks were discussed with the patient.                            All questions were answered, and informed consent                            was obtained. Prior Anticoagulants: The patient has                            taken no previous anticoagulant or antiplatelet                            agents. ASA Grade Assessment: III - A patient with                            severe systemic disease. After reviewing the risks                            and  benefits, the patient was deemed in                            satisfactory condition to undergo the procedure.                           After obtaining informed consent, the colonoscope                            was passed under direct vision. Throughout the                            procedure, the patient's blood pressure, pulse, and                            oxygen saturations were  monitored continuously. The                            EC-[id number] ([id number]) scope was introduced through                            the anus and advanced to the the cecum, identified                            by appendiceal orifice and ileocecal valve. The                            colonoscopy was performed without difficulty. The                            patient tolerated the procedure well. The quality                            of the bowel preparation was adequate. The                            ileocecal valve, appendiceal orifice, and rectum                            were photographed. Scope In: 8:12:57 AM Scope Out: 9:32:41 AM Scope Withdrawal Time: 0 hours 24 minutes 26 seconds  Total Procedure Duration: 1 hour 19 minutes 44 seconds  Findings:      The perianal exam findings include a perianal rash.      Multiple small and large-mouthed diverticula were found in the sigmoid       colon, descending colon and transverse colon. There was active bleeding       coming from the diverticular opening in transverse colon with adherent       clot. To stop active bleeding, three hemostatic clips were successfully       placed (MR conditional). There was no bleeding at the end of the       procedure.      Non-bleeding internal hemorrhoids were found during retroflexion. The       hemorrhoids were medium-sized. Impression:               -  Perianal rash found on perianal exam.                           - Severe diverticulosis in the sigmoid colon, in                            the descending colon and in the transverse colon.                            There was active bleeding coming from the                            diverticular opening. Clips (MR conditional) were                            placed.                           - Non-bleeding internal hemorrhoids.                           - No specimens collected. Moderate Sedation:      N/A- Per Anesthesia Care Recommendation:            - Patient has a contact number available for                            emergencies. The signs and symptoms of potential                            delayed complications were discussed with the                            patient. Return to normal activities tomorrow.                            Written discharge instructions were provided to the                            patient.                           - Resume previous diet.                           - Continue present medications.                           - No ibuprofen, naproxen, or other non-steroidal                            anti-inflammatory drugs.                           - Monitor Hgb and transfuse as needed                           -  No repeat colonoscopy due to age. Procedure Code(s):        --- Professional ---                           [phone], 59, Colonoscopy, flexible; with control of                            bleeding, any method Diagnosis Code(s):        --- Professional ---                           R21, Rash and other nonspecific skin eruption                           K64.8, Other hemorrhoids                           K57.31, Diverticulosis of large intestine without                            perforation or abscess with bleeding                           K92.1, Melena (includes Hematochezia) CPT copyright 2016 [employer]. All rights reserved. The codes documented in this report are preliminary and upon coder review may  be revised to meet current compliance requirements. Mauri Pole, MD 11/09/2017 9:55:24 AM This report has been signed electronically. Number of Addenda: 0

## 2017-11-09 NOTE — Anesthesia Postprocedure Evaluation (Signed)
Anesthesia Post Note  Patient: ARLETHIA BASSO  Procedure(s) Performed: COLONOSCOPY WITH PROPOFOL (N/A )     Patient location during evaluation: Endoscopy Anesthesia Type: MAC Level of consciousness: awake and alert, patient cooperative and oriented Pain management: pain level controlled Vital Signs Assessment: post-procedure vital signs reviewed and stable Respiratory status: spontaneous breathing, respiratory function stable and nonlabored ventilation Cardiovascular status: blood pressure returned to baseline and stable Postop Assessment: no apparent nausea or vomiting Anesthetic complications: no    Last Vitals:  Vitals:   11/09/17 0710 11/09/17 0943  BP: (!) 116/47 (!) 104/45  Pulse:    Resp: 19 17  Temp: 36.5 C (!) 36.4 C  SpO2: 94% 100%    Last Pain:  Vitals:   11/09/17 0943  TempSrc: Oral  PainSc:                  Mandeep Kiser,E. Jamiel Goncalves

## 2017-11-10 LAB — HEMOGLOBIN AND HEMATOCRIT, BLOOD
HEMATOCRIT: 24.1 % — AB (ref 36.0–46.0)
Hemoglobin: 8.1 g/dL — ABNORMAL LOW (ref 12.0–15.0)

## 2017-11-10 LAB — HEMOGLOBIN: Hemoglobin: 9 g/dL — ABNORMAL LOW (ref 12.0–15.0)

## 2017-11-10 LAB — PREPARE RBC (CROSSMATCH)

## 2017-11-10 MED ORDER — SODIUM CHLORIDE 0.9 % IV SOLN: Freq: Once | INTRAVENOUS | Status: DC

## 2017-11-10 NOTE — Progress Notes (Addendum)
    Progress Note   Subjective  Chief Complaint: Lower GI bleed, anemia  Patient doing well this morning.  She tells me she has had no further bleeding.  She is eating breakfast at the moment.  Patient's only new complaint is of a left foot pain in my "arch", the patient feels that this may hinder her walking in the future.   Objective   Vital signs in last 24 hours: Temp:  [97.5 F (36.4 C)-100.2 F (37.9 C)] 99.2 F (37.3 C) (11/25 0800) Pulse Rate:  [93-104] 95 (11/25 0800) Resp:  [16-30] 20 (11/25 0800) BP: (82-150)/(16-59) 121/41 (11/25 0800) SpO2:  [91 %-100 %] 94 % (11/25 0800) Last BM Date: 11/07/17 General:    Elderly Caucasian female in NAD Heart:  Regular rate and rhythm; no murmurs Lungs: Respirations even and unlabored, lungs CTA bilaterally Abdomen:  Soft, nontender and nondistended. Normal bowel sounds. Extremities:  Without edema. Neurologic:  Alert and oriented,  grossly normal neurologically. Psych:  Cooperative. Normal mood and affect.  Intake/Output from previous day: 11/24 0701 - 11/25 0700 In: 850 [I.V.:850] Out: 1105 [Urine:1100; Blood:5] Intake/Output this shift: Total I/O In: 384 [Blood:384] Out: -   Lab Results: Recent Labs    11/07/17 1730  11/08/17 1847 11/09/17 0704 11/09/17 1847  HGB 8.9*   < > 8.6* 7.2* 7.4*  HCT 25.4*  --   --   --   --    < > = values in this interval not displayed.    Studies/Results: 11/09/17-colonoscopy, Dr. Silverio Decamp: Findings: Perianal exam including a perianal rash, multiple small and large mouth diverticula in the sigmoid, descending and transverse colon, active bleeding coming from the diverticular opening in the transverse, with adherent clot.  3 hemostatic clips were successfully placed no bleeding at the end of the procedure, nonbleeding internal hemorrhoids Recommendations: Monitor hemoglobin and transfuse as needed, no repeat colonoscopy due to age, no ibuprofen, naproxen or other NSAIDs   Assessment /  Plan:   Assessment: 1.  Diverticular bleed: Patient status post clipping of diverticular bleed via colonoscopy 11/09/17, patient did receive 1 more unit of PRBCs overnight, hemoglobin currently remaining stable, no further signs of bleeding 2.  Acute blood loss anemia  Plan: 1.  Continue to monitor hemoglobin with transfusion 2.  Patient may continue current diet 3.  Please waiting final recommendations from Dr. Silverio Decamp later today  Thank you for your kind consultation.   LOS: 7 days   Levin Erp  11/10/2017, 9:02 AM  Pager # [phone]   Attending physician's note   I have taken an interval history, reviewed the chart and examined the patient. I agree with the Advanced Practitioner's note, impression and recommendations.  No further episodes of hematochezia.  Hemoglobin stable.  Status post colonoscopy yesterday with placement of hemostatic clips at its site of diverticular hemorrhage.  She is feeling extremely weak from being in bed for the past week.  Also complains of pain in her foot, she had hit her foot prior to hospitalization and is worried about possible fracture.  I informed her nurse who will contact hospitalist Dr. Venetia Constable. Will sign off, please call with any questions  K Denzil Magnuson, MD [phone] Mon-Fri 8a-5p [phone] after 5p, weekends, holidays

## 2017-11-10 NOTE — Progress Notes (Signed)
[facility] PROGRESS NOTE    Progress Note  [name]  MBW:[id number] DOB: Oct 12, 1921 DOA: 11/03/2017 PCP: Blanchie Serve, MD     Brief Narrative:   [name] is an 83 y.o. female past medical history of diverticular bleed in 2012 presents with hematochezia started on the day of admission GI has been consulted  Assessment/Plan:   Hematochezia/Acute blood loss anemia: Likely a diverticular bleed. Hemoglobin on 10/09/2017 was 12.6. Hbg today is 8.6,S/p EGD with no source of bleeding identify. Nuclear scan negative. Colonoscopy shows diverticular bleeding, s/p cliping. Cont to monitor hbg   DVT prophylaxis: SCD Family Communication:none Disposition Plan/Barrier to D/C: keep in SDU Code Status:     Code Status Orders  (From admission, onward)        Start     Ordered   11/03/17 2152  Limited resuscitation (code)  Continuous    Question Answer Comment  In the event of cardiac or respiratory ARREST: Initiate Code Blue, Call Rapid Response Yes   In the event of cardiac or respiratory ARREST: Perform CPR No   In the event of cardiac or respiratory ARREST: Perform Intubation/Mechanical Ventilation Yes   In the event of cardiac or respiratory ARREST: Use NIPPV/BiPAp only if indicated Yes   In the event of cardiac or respiratory ARREST: Administer ACLS medications if indicated Yes   In the event of cardiac or respiratory ARREST: Perform Defibrillation or Cardioversion if indicated Yes      11/03/17 2152    Code Status History    Date Active Date Inactive Code Status Order ID Comments User Context   This patient has a current code status but no historical code status.        IV Access:    Peripheral IV   Procedures and diagnostic studies:   No results found.   Medical Consultants:    None.  Anti-Infectives:   None  Subjective:    [name] she denies any abdominal pain. No bloody BM overnight.  Objective:    Vitals:   11/10/17  0545 11/10/17 0600 11/10/17 0615 11/10/17 0630  BP: (!) 124/40 (!) 82/30 (!) 112/46   Pulse: (!) 104   93  Resp: 18 (!) 22 (!) 24 (!) 22  Temp: 99 F (37.2 C)  99.2 F (37.3 C)   TempSrc: Oral  Oral   SpO2:   98% 91%  Weight:      Height:        Intake/Output Summary (Last 24 hours) at 11/10/2017 0657 Last data filed at 11/10/2017 0600 Gross per 24 hour  Intake 850 ml  Output 1105 ml  Net -255 ml   Filed Weights   11/03/17 2233 11/08/17 1008 11/09/17 0710  Weight: 70.4 kg (155 lb 3.3 oz) 70.3 kg (155 lb) 70.3 kg (155 lb)    Exam: General exam: In no acute distress. Respiratory system: Good air movement clear to auscultation. Cardiovascular system: Regular with positive S1-S2. Gastrointestinal system: Positive bowel sounds soft nontender nondistended Central nervous system: Awake alert and oriented 3 Extremities: No extremity edema. Skin: No rashes, lesions or ulcers Psychiatry: Judgement and insight appear normal. Mood & affect appropriate.    Data Reviewed:    Labs: Basic Metabolic Panel: Recent Labs  Lab 11/03/17 1818 11/03/17 1829 11/03/17 2039 11/05/17 0346  NA 136 138 137 135  K 3.7 3.6 3.7 3.9  CL 103 101 103 109  CO2 25  --   --  22  GLUCOSE 103* 102*  146* 119*  BUN 23* 23* 26* 34*  CREATININE 0.89 0.90 0.90 0.84  CALCIUM 9.2  --   --  8.2*   GFR Estimated Creatinine Clearance: 38.5 mL/min (by C-G formula based on SCr of 0.84 mg/dL). Liver Function Tests: Recent Labs  Lab 11/03/17 1818  AST 20  ALT 13*  ALKPHOS 78  BILITOT 0.4  PROT 6.5  ALBUMIN 3.1*   No results for input(s): LIPASE, AMYLASE in the last 168 hours. No results for input(s): AMMONIA in the last 168 hours. Coagulation profile Recent Labs  Lab 11/03/17 1818  INR 1.00    CBC: Recent Labs  Lab 11/03/17 1818  11/05/17 1206 11/05/17 1742 11/06/17 0003 11/06/17 0809  11/07/17 1730 11/08/17 0653 11/08/17 1847 11/09/17 0704 11/09/17 1847  WBC 14.1*  --   --   --    --   --   --   --   --   --   --   --   HGB 10.3*   < > 8.5* 7.9* 7.1* 9.0*   < > 8.9* 7.9* 8.6* 7.2* 7.4*  HCT 31.6*   < > 24.8* 22.8* 20.8* 26.2*  --  25.4*  --   --   --   --   MCV 95.5  --   --   --   --   --   --   --   --   --   --   --   PLT 213  --   --   --   --   --   --   --   --   --   --   --    < > = values in this interval not displayed.   Cardiac Enzymes: No results for input(s): CKTOTAL, CKMB, CKMBINDEX, TROPONINI in the last 168 hours. BNP (last 3 results) No results for input(s): PROBNP in the last 8760 hours. CBG: Recent Labs  Lab 11/06/17 0309 11/06/17 0759 11/06/17 1122 11/06/17 1524 11/09/17 0732  GLUCAP 101* 94 91 89 80   D-Dimer: No results for input(s): DDIMER in the last 72 hours. Hgb A1c: No results for input(s): HGBA1C in the last 72 hours. Lipid Profile: No results for input(s): CHOL, HDL, LDLCALC, TRIG, CHOLHDL, LDLDIRECT in the last 72 hours. Thyroid function studies: No results for input(s): TSH, T4TOTAL, T3FREE, THYROIDAB in the last 72 hours.  Invalid input(s): FREET3 Anemia work up: No results for input(s): VITAMINB12, FOLATE, FERRITIN, TIBC, IRON, RETICCTPCT in the last 72 hours. Sepsis Labs: Recent Labs  Lab 11/03/17 1818  WBC 14.1*   Microbiology Recent Results (from the past 240 hour(s))  MRSA PCR Screening     Status: None   Collection Time: 11/03/17 10:10 PM  Result Value Ref Range Status   MRSA by PCR NEGATIVE NEGATIVE Final    Comment:        The GeneXpert MRSA Assay (FDA approved for NASAL specimens only), is one component of a comprehensive MRSA colonization surveillance program. It is not intended to diagnose MRSA infection nor to guide or monitor treatment for MRSA infections.      Medications:   . dorzolamide-timolol  1 drop Both Eyes BID  . DULoxetine  20 mg Oral Daily  . folic acid  833 mcg Oral Daily  . latanoprost  1 drop Both Eyes QHS  . loratadine  10 mg Oral Daily  . pantoprazole (PROTONIX) IV   40 mg Intravenous Q24H  . vitamin B-12  1,000 mcg  Oral Daily   Continuous Infusions: . sodium chloride    . sodium chloride    . dextrose 5 % and 0.9 % NaCl with KCl 20 mEq/L 10 mL/hr at 11/08/17 1726      LOS: 7 days   [facility] Pager [phone]  *Please refer to [email or web address], password [email or web address] to get updated schedule on who will round on this patient, as hospitalists switch teams weekly. If 7PM-7AM, please contact night-coverage at [email or web address], password [email or web address] for any overnight needs.  11/10/2017, 6:57 AM

## 2017-11-11 LAB — TYPE AND SCREEN
ABO/RH(D): B POS
ANTIBODY SCREEN: NEGATIVE
UNIT DIVISION: 0
UNIT DIVISION: 0
Unit division: 0

## 2017-11-11 LAB — CBC
HCT: 22 % — ABNORMAL LOW (ref 36.0–46.0)
Hemoglobin: 7.3 g/dL — ABNORMAL LOW (ref 12.0–15.0)
MCH: 31.5 pg (ref 26.0–34.0)
MCHC: 33.2 g/dL (ref 30.0–36.0)
MCV: 94.8 fL (ref 78.0–100.0)
PLATELETS: 237 10*3/uL (ref 150–400)
RBC: 2.32 MIL/uL — ABNORMAL LOW (ref 3.87–5.11)
RDW: 17 % — ABNORMAL HIGH (ref 11.5–15.5)
WBC: 11.6 10*3/uL — ABNORMAL HIGH (ref 4.0–10.5)

## 2017-11-11 LAB — BPAM RBC
BLOOD PRODUCT EXPIRATION DATE: 201811282359
BLOOD PRODUCT EXPIRATION DATE: 201812222359
Blood Product Expiration Date: 201812222359
ISSUE DATE / TIME: 201811221124
ISSUE DATE / TIME: 201811250538
UNIT TYPE AND RH: 7300
Unit Type and Rh: 7300
Unit Type and Rh: 7300

## 2017-11-11 LAB — HEMOGLOBIN AND HEMATOCRIT, BLOOD
HEMATOCRIT: 23.5 % — AB (ref 36.0–46.0)
Hemoglobin: 7.8 g/dL — ABNORMAL LOW (ref 12.0–15.0)

## 2017-11-11 LAB — HEMOGLOBIN: Hemoglobin: 7.8 g/dL — ABNORMAL LOW (ref 12.0–15.0)

## 2017-11-11 MED ORDER — PEG-KCL-NACL-NASULF-NA ASC-C 100 G PO SOLR: 1.0000 | Freq: Once | ORAL | Status: DC

## 2017-11-11 MED ORDER — SODIUM CHLORIDE 0.9 % IV BOLUS (SEPSIS)
1000.0000 mL | Freq: Once | INTRAVENOUS | Status: AC
  Administered 2017-11-11: 1000 mL via INTRAVENOUS

## 2017-11-11 MED ORDER — PEG-KCL-NACL-NASULF-NA ASC-C 100 G PO SOLR
0.5000 | Freq: Once | ORAL | Status: AC
  Administered 2017-11-11: 100 g via ORAL
  Filled 2017-11-11: qty 1

## 2017-11-11 MED ORDER — PEG-KCL-NACL-NASULF-NA ASC-C 100 G PO SOLR
0.5000 | Freq: Once | ORAL | Status: AC
  Administered 2017-11-12: 100 g via ORAL

## 2017-11-11 NOTE — Clinical Social Work Note (Signed)
Clinical Social Work Assessment  Patient Details  Name: [name] MRN: [id number] Date of Birth: 03/19/1921  Date of referral:  11/11/17               Reason for consult:  Facility Placement                Permission sought to share information with:  Family Supports, Facility Contact Representative Permission granted to share information::  Yes, Verbal Permission Granted  Name::        Agency::  [facility]  Relationship::     Contact Information:     Housing/Transportation Living arrangements for the past 2 months:  Independent Living Facility, Skilled Nursing Facility Source of Information:  Patient Patient Interpreter Needed:  None Criminal Activity/Legal Involvement Pertinent to Current Situation/Hospitalization:  No - Comment as needed Significant Relationships:  Adult Children, Community Support Lives with:  Facility Resident Do you feel safe going back to the place where you live?  Yes Need for family participation in patient care:  Yes   Care giving concerns:  Patient admitted for Hematochezia.  She is a resident at [facility].   Patient reports she stayed in nursing care at  SNF, for about eight weeks over the summer for low sodium and she was just starting to regain to her strength before this hospital admission.  Patient states she has been very weak since June and currently uses a power to chair to get around the facility.    Patient is concern the facility will not have a SNF bed for her at discharge.   Social Worker assessment / plan: CSW met with the patient at bedside, explain role-to help assist with discharge back to [facility]. Patient is from Independent livivng and is hopeful to transition to short rehab at SNF before returning to her apartment.   Patient states she cannot stand up by herself she needs support. She reports the nurse had to use a gait built to help her stand up because she is unable to bear weight on her feet.    CSW confirm with facility social worker the patient will have a SNF bed at discharge.   Plan: Complete FL2  PT pending.  Employment status:  Retired Insurance information:  Managed Medicare PT Recommendations:  Not assessed at this time(Pending) Information / Referral to community resources:  Skilled Nursing Facility  Patient/Family's Response to care:  Patient agreeable and responding to well to care. No family at bedside.   Patient/Family's Understanding of and Emotional Response to Diagnosis, Current Treatment, and Prognosis:  "I want to get back to my own apartment." Patient presented motivated and optimistic about completing rehab and regaining her strength. Patient is very knowledgeable about her care.   Emotional Assessment Appearance:  Appears stated age Attitude/Demeanor/Rapport:    Affect (typically observed):  Accepting, Calm Orientation:  Oriented to Self, Oriented to Place, Oriented to  Time, Oriented to Situation Alcohol / Substance use:  Not Applicable Psych involvement (Current and /or in the community):  No (Comment)  Discharge Needs  Concerns to be addressed:  Discharge Planning Concerns Readmission within the last 30 days:  Yes Current discharge risk:  Dependent with Mobility Barriers to Discharge:  No Barriers Identified    A , LCSW 11/11/2017, 12:09 PM  

## 2017-11-11 NOTE — H&P (View-Only) (Signed)
     [facility] Progress Note  Chief Complaint:   GI bleeding   Subjective: Per RN, patient has had three bloody BMs today. Stool is dark but basically covered in red blood with some clots. She is on bedpan right now. No abdominal pain, just feels tender in lower abdomen.   Objective:  Vital signs in last 24 hours: Temp:  [97.7 F (36.5 C)-98.2 F (36.8 C)] 97.8 F (36.6 C) (11/26 1215) Pulse Rate:  [69-99] 89 (11/26 0811) Resp:  [15-23] 15 (11/26 0811) BP: (76-153)/(18-74) 153/74 (11/26 0811) SpO2:  [89 %-96 %] 92 % (11/26 0811) Last BM Date: 11/07/17 General:   Alert, well-developed, white female in NAD EENT:  Normal hearing, non icteric sclera, conjunctive pale.  Heart:  Regular rate and rhythm Pulm: Normal respiratory effort Abdomen:  Soft, nondistended, nontender.  Normal bowel sounds, no masses felt. No hepatomegaly.    Neurologic:  Alert and  oriented x4;  grossly normal neurologically. Psych:  Pleasant, cooperative.  Normal mood and affect.   Intake/Output from previous day: 11/25 0701 - 11/26 0700 In: 624 [I.V.:240; Blood:384] Out: 1800 [Urine:1800] Intake/Output this shift: Total I/O In: 266.2 [P.O.:240; I.V.:26.2] Out: 402 [Urine:400; Stool:2]  Lab Results: Recent Labs    11/10/17 1626 11/10/17 1917 11/11/17 0643 11/11/17 1449  HGB 8.1* 9.0* 7.8* 7.8*  HCT 24.1*  --   --  23.5*    ASSESSMENT / PLAN:   1. 83 yo female admitted 11/18 with lower GI bleeding. Two bleeding scan this admission were negative. She is s/p colonoscopy with hemoclip placement at site of active diverticular hemorrhage in transverse colon on 11/24. Bleeding  resolved but then today she has had 3 bloody BMs, (bright red with clots). Hgb low but stable at 7.8 since ~ 7 am.  She feels okay, no chest pain or SOB.  -would keep her in the unit for now. Serial CBCs. Transfuse as needed.  -If bleeding persists we may need to repeat a bleeding scan. Renal function is okay if  required embolization -putting her back on clear liquids for now.   2. Anemia of acute blood loss secondary to #1, s/p 6 units of blood. Hgb low but stable at 7.8 since ~ 7 am.    Principal Problem:   Hematochezia Active Problems:   Acute blood loss anemia   Diverticulosis of colon with hemorrhage   Melena   LOS: 8 days   Tye Savoy ,NP 11/11/2017, 3:58 PM  Pager number [phone]     Attending physician's note   I have taken an interval history, reviewed the chart and examined the patient. I agree with the Advanced Practitioner's note, impression and recommendations. Recurrent diverticular bleed. Anticipate Hb drop. Given that the site of diverticular bleeding was identified on 11/24 colonoscopy I advised a repeat colonoscopy tomorrow and the patient agrees to proceed. Bowel prep tonight.   Lucio Edward, MD [facility] [phone] Mon-Fri 8a-5p [phone] after 5p, weekends, holidays

## 2017-11-11 NOTE — Progress Notes (Signed)
     [facility] Progress Note  Chief Complaint:   GI bleeding   Subjective: Per RN, patient has had three bloody BMs today. Stool is dark but basically covered in red blood with some clots. She is on bedpan right now. No abdominal pain, just feels tender in lower abdomen.   Objective:  Vital signs in last 24 hours: Temp:  [97.7 F (36.5 C)-98.2 F (36.8 C)] 97.8 F (36.6 C) (11/26 1215) Pulse Rate:  [69-99] 89 (11/26 0811) Resp:  [15-23] 15 (11/26 0811) BP: (76-153)/(18-74) 153/74 (11/26 0811) SpO2:  [89 %-96 %] 92 % (11/26 0811) Last BM Date: 11/07/17 General:   Alert, well-developed, white female in NAD EENT:  Normal hearing, non icteric sclera, conjunctive pale.  Heart:  Regular rate and rhythm Pulm: Normal respiratory effort Abdomen:  Soft, nondistended, nontender.  Normal bowel sounds, no masses felt. No hepatomegaly.    Neurologic:  Alert and  oriented x4;  grossly normal neurologically. Psych:  Pleasant, cooperative.  Normal mood and affect.   Intake/Output from previous day: 11/25 0701 - 11/26 0700 In: 624 [I.V.:240; Blood:384] Out: 1800 [Urine:1800] Intake/Output this shift: Total I/O In: 266.2 [P.O.:240; I.V.:26.2] Out: 402 [Urine:400; Stool:2]  Lab Results: Recent Labs    11/10/17 1626 11/10/17 1917 11/11/17 0643 11/11/17 1449  HGB 8.1* 9.0* 7.8* 7.8*  HCT 24.1*  --   --  23.5*    ASSESSMENT / PLAN:   1. 83 yo female admitted 11/18 with lower GI bleeding. Two bleeding scan this admission were negative. She is s/p colonoscopy with hemoclip placement at site of active diverticular hemorrhage in transverse colon on 11/24. Bleeding  resolved but then today she has had 3 bloody BMs, (bright red with clots). Hgb low but stable at 7.8 since ~ 7 am.  She feels okay, no chest pain or SOB.  -would keep her in the unit for now. Serial CBCs. Transfuse as needed.  -If bleeding persists we may need to repeat a bleeding scan. Renal function is okay if  required embolization -putting her back on clear liquids for now.   2. Anemia of acute blood loss secondary to #1, s/p 6 units of blood. Hgb low but stable at 7.8 since ~ 7 am.    Principal Problem:   Hematochezia Active Problems:   Acute blood loss anemia   Diverticulosis of colon with hemorrhage   Melena   LOS: 8 days   Tye Savoy ,NP 11/11/2017, 3:58 PM  Pager number [phone]     Attending physician's note   I have taken an interval history, reviewed the chart and examined the patient. I agree with the Advanced Practitioner's note, impression and recommendations. Recurrent diverticular bleed. Anticipate Hb drop. Given that the site of diverticular bleeding was identified on 11/24 colonoscopy I advised a repeat colonoscopy tomorrow and the patient agrees to proceed. Bowel prep tonight.   Lucio Edward, MD [facility] [phone] Mon-Fri 8a-5p [phone] after 5p, weekends, holidays

## 2017-11-11 NOTE — Progress Notes (Signed)
[facility] PROGRESS NOTE    Progress Note  [name]  WCB:[id number] DOB: 1920/12/23 DOA: 11/03/2017 PCP: Blanchie Serve, MD     Brief Narrative:   [name] is an 83 y.o. female past medical history of diverticular bleed in 2012 presents with hematochezia started on the day of admission GI has been consulted  Assessment/Plan:   Hematochezia/Acute blood loss anemia: Likely a diverticular bleed. Hemoglobin on 10/09/2017 was 12.6. Hbg today is 8.6,S/p EGD with no source of bleeding identify. Nuclear scan negative. Colonoscopy shows diverticular bleeding, s/p cliping. Mild drop in hemoglobin after unit of packed red blood cells, today 7.8 we will continue to monitor. Physical therapy evaluation.   DVT prophylaxis: SCD Family Communication:none Disposition Plan/Barrier to D/C: Transfer to MedSurg Code Status:     Code Status Orders  (From admission, onward)        Start     Ordered   11/03/17 2152  Limited resuscitation (code)  Continuous    Question Answer Comment  In the event of cardiac or respiratory ARREST: Initiate Code Blue, Call Rapid Response Yes   In the event of cardiac or respiratory ARREST: Perform CPR No   In the event of cardiac or respiratory ARREST: Perform Intubation/Mechanical Ventilation Yes   In the event of cardiac or respiratory ARREST: Use NIPPV/BiPAp only if indicated Yes   In the event of cardiac or respiratory ARREST: Administer ACLS medications if indicated Yes   In the event of cardiac or respiratory ARREST: Perform Defibrillation or Cardioversion if indicated Yes      11/03/17 2152    Code Status History    Date Active Date Inactive Code Status Order ID Comments User Context   This patient has a current code status but no historical code status.        IV Access:    Peripheral IV   Procedures and diagnostic studies:   No results found.   Medical Consultants:    None.  Anti-Infectives:   None  Subjective:     [name] rating her diet feels with a little bit more energy. No bloody BM overnight.  Objective:    Vitals:   11/11/17 0300 11/11/17 0400 11/11/17 0500 11/11/17 0600  BP:  (!) 109/59  (!) 117/56  Pulse: 76 77 79 69  Resp: (!) 23 17 15 16   Temp: 97.7 F (36.5 C)     TempSrc: Oral     SpO2: 90% 91% 93% 92%  Weight:      Height:        Intake/Output Summary (Last 24 hours) at 11/11/2017 0805 Last data filed at 11/11/2017 0600 Gross per 24 hour  Intake 240 ml  Output 1700 ml  Net -1460 ml   Filed Weights   11/03/17 2233 11/08/17 1008 11/09/17 0710  Weight: 70.4 kg (155 lb 3.3 oz) 70.3 kg (155 lb) 70.3 kg (155 lb)    Exam: General exam: In no acute distress. Respiratory system: Good air movement clear to auscultation. Cardiovascular system: Regular with positive S1-S2. Gastrointestinal system: Positive bowel sounds soft nontender nondistended Central nervous system: Awake alert and oriented 3 Extremities: No extremity edema. Skin: No rashes, lesions or ulcers Psychiatry: Judgement and insight appear normal. Mood & affect appropriate.    Data Reviewed:    Labs: Basic Metabolic Panel: Recent Labs  Lab 11/05/17 0346  NA 135  K 3.9  CL 109  CO2 22  GLUCOSE 119*  BUN 34*  CREATININE 0.84  CALCIUM  8.2*   GFR Estimated Creatinine Clearance: 38.5 mL/min (by C-G formula based on SCr of 0.84 mg/dL). Liver Function Tests: No results for input(s): AST, ALT, ALKPHOS, BILITOT, PROT, ALBUMIN in the last 168 hours. No results for input(s): LIPASE, AMYLASE in the last 168 hours. No results for input(s): AMMONIA in the last 168 hours. Coagulation profile No results for input(s): INR, PROTIME in the last 168 hours.  CBC: Recent Labs  Lab 11/05/17 1742 11/06/17 0003 11/06/17 0809  11/07/17 1730  11/09/17 0704 11/09/17 1847 11/10/17 1626 11/10/17 1917 11/11/17 0643  HGB 7.9* 7.1* 9.0*   < > 8.9*   < > 7.2* 7.4* 8.1* 9.0* 7.8*  HCT 22.8* 20.8* 26.2*   --  25.4*  --   --   --  24.1*  --   --    < > = values in this interval not displayed.   Cardiac Enzymes: No results for input(s): CKTOTAL, CKMB, CKMBINDEX, TROPONINI in the last 168 hours. BNP (last 3 results) No results for input(s): PROBNP in the last 8760 hours. CBG: Recent Labs  Lab 11/06/17 0309 11/06/17 0759 11/06/17 1122 11/06/17 1524 11/09/17 0732  GLUCAP 101* 94 91 89 80   D-Dimer: No results for input(s): DDIMER in the last 72 hours. Hgb A1c: No results for input(s): HGBA1C in the last 72 hours. Lipid Profile: No results for input(s): CHOL, HDL, LDLCALC, TRIG, CHOLHDL, LDLDIRECT in the last 72 hours. Thyroid function studies: No results for input(s): TSH, T4TOTAL, T3FREE, THYROIDAB in the last 72 hours.  Invalid input(s): FREET3 Anemia work up: No results for input(s): VITAMINB12, FOLATE, FERRITIN, TIBC, IRON, RETICCTPCT in the last 72 hours. Sepsis Labs: No results for input(s): PROCALCITON, WBC, LATICACIDVEN in the last 168 hours. Microbiology Recent Results (from the past 240 hour(s))  MRSA PCR Screening     Status: None   Collection Time: 11/03/17 10:10 PM  Result Value Ref Range Status   MRSA by PCR NEGATIVE NEGATIVE Final    Comment:        The GeneXpert MRSA Assay (FDA approved for NASAL specimens only), is one component of a comprehensive MRSA colonization surveillance program. It is not intended to diagnose MRSA infection nor to guide or monitor treatment for MRSA infections.      Medications:   . dorzolamide-timolol  1 drop Both Eyes BID  . DULoxetine  20 mg Oral Daily  . folic acid  863 mcg Oral Daily  . latanoprost  1 drop Both Eyes QHS  . loratadine  10 mg Oral Daily  . pantoprazole (PROTONIX) IV  40 mg Intravenous Q24H  . vitamin B-12  1,000 mcg Oral Daily   Continuous Infusions: . sodium chloride    . sodium chloride    . dextrose 5 % and 0.9 % NaCl with KCl 20 mEq/L 10 mL/hr at 11/10/17 2000      LOS: 8 days   Charlynne Cousins  [facility] Pager [phone]  *Please refer to [email or web address], password [email or web address] to get updated schedule on who will round on this patient, as hospitalists switch teams weekly. If 7PM-7AM, please contact night-coverage at [email or web address], password [email or web address] for any overnight needs.  11/11/2017, 8:05 AM

## 2017-11-12 ENCOUNTER — Encounter (HOSPITAL_COMMUNITY): Payer: Self-pay | Admitting: Emergency Medicine

## 2017-11-12 ENCOUNTER — Inpatient Hospital Stay (HOSPITAL_COMMUNITY): Payer: Medicare HMO | Admitting: Anesthesiology

## 2017-11-12 ENCOUNTER — Encounter (HOSPITAL_COMMUNITY)
Admission: EM | Disposition: A | Discharge status: Skilled Nursing Facility | Payer: Self-pay | Source: Home / Self Care | Attending: Internal Medicine

## 2017-11-12 DIAGNOSIS — K640 First degree hemorrhoids: Secondary | ICD-10-CM

## 2017-11-12 HISTORY — PX: COLONOSCOPY WITH PROPOFOL: SHX5780

## 2017-11-12 LAB — CBC
HCT: 18.3 % — ABNORMAL LOW (ref 36.0–46.0)
HCT: 24.3 % — ABNORMAL LOW (ref 36.0–46.0)
HEMATOCRIT: 26.7 % — AB (ref 36.0–46.0)
HEMOGLOBIN: 8.9 g/dL — AB (ref 12.0–15.0)
Hemoglobin: 6.1 g/dL — CL (ref 12.0–15.0)
Hemoglobin: 8.3 g/dL — ABNORMAL LOW (ref 12.0–15.0)
MCH: 31.4 pg (ref 26.0–34.0)
MCH: 29.2 pg (ref 26.0–34.0)
MCH: 29.6 pg (ref 26.0–34.0)
MCHC: 33.3 g/dL (ref 30.0–36.0)
MCHC: 33.3 g/dL (ref 30.0–36.0)
MCHC: 34.2 g/dL (ref 30.0–36.0)
MCV: 94.3 fL (ref 78.0–100.0)
MCV: 87.5 fL (ref 78.0–100.0)
MCV: 86.8 fL (ref 78.0–100.0)
PLATELETS: 206 10*3/uL (ref 150–400)
PLATELETS: 202 10*3/uL (ref 150–400)
Platelets: 212 10*3/uL (ref 150–400)
RBC: 1.94 MIL/uL — ABNORMAL LOW (ref 3.87–5.11)
RBC: 3.05 MIL/uL — ABNORMAL LOW (ref 3.87–5.11)
RBC: 2.8 MIL/uL — ABNORMAL LOW (ref 3.87–5.11)
RDW: 17.2 % — AB (ref 11.5–15.5)
RDW: 18.4 % — ABNORMAL HIGH (ref 11.5–15.5)
RDW: 18.6 % — ABNORMAL HIGH (ref 11.5–15.5)
WBC: 11.2 10*3/uL — AB (ref 4.0–10.5)
WBC: 9.1 10*3/uL (ref 4.0–10.5)
WBC: 10.8 10*3/uL — ABNORMAL HIGH (ref 4.0–10.5)

## 2017-11-12 LAB — PREPARE RBC (CROSSMATCH)

## 2017-11-12 SURGERY — COLONOSCOPY WITH PROPOFOL
Anesthesia: Monitor Anesthesia Care

## 2017-11-12 MED ORDER — PROPOFOL 10 MG/ML IV BOLUS
INTRAVENOUS | Status: AC
  Filled 2017-11-12: qty 20

## 2017-11-12 MED ORDER — LACTATED RINGERS IV SOLN
INTRAVENOUS | Status: DC | PRN
  Administered 2017-11-12: 13:00:00 via INTRAVENOUS

## 2017-11-12 MED ORDER — PROPOFOL 10 MG/ML IV BOLUS
INTRAVENOUS | Status: DC | PRN
  Administered 2017-11-12 (×2): 10 mg via INTRAVENOUS
  Administered 2017-11-12 (×2): 20 mg via INTRAVENOUS
  Administered 2017-11-12 (×16): 10 mg via INTRAVENOUS

## 2017-11-12 MED ORDER — HYDROCORTISONE 2.5 % RE CREA
TOPICAL_CREAM | Freq: Two times a day (BID) | RECTAL | Status: DC
  Administered 2017-11-12 – 2017-11-13 (×4): via RECTAL
  Administered 2017-11-15: 1 via RECTAL
  Filled 2017-11-12 (×2): qty 28.35

## 2017-11-12 MED ORDER — SODIUM CHLORIDE 0.9 % IV SOLN: Freq: Once | INTRAVENOUS | Status: DC

## 2017-11-12 SURGICAL SUPPLY — 22 items

## 2017-11-12 NOTE — Progress Notes (Signed)
PT Cancellation Note  Patient Details Name: [name] MRN: [id number] DOB: [birth year]   Cancelled Treatment:    Reason Eval/Treat Not Completed: Medical issues which prohibited therapy. To get blood for low HGB. Check back when stable.   Claretha Cooper 11/12/2017, 7:47 AM Tresa Endo PT [phone]

## 2017-11-12 NOTE — Progress Notes (Signed)
[facility] PROGRESS NOTE    Progress Note  [name]  PNT:[id number] DOB: 1921-05-10 DOA: 11/03/2017 PCP: Blanchie Serve, MD     Brief Narrative:   [name] is an 83 y.o. female past medical history of diverticular bleed in 2012 presents with hematochezia started on the day of admission GI has been consulted  Assessment/Plan:   Hematochezia/Acute blood loss anemia: Likely a diverticular bleed. Nuclear scan negative, EGD done showed no source of bleeding. S/p colonoscopy on 11.24.2018 with  source of bleeding identify. . Colonoscopy shows diverticular bleed, s/p cliping.  24 hours after this colonoscopy she re-bled. GI was reconsulted recommended an additional GI was consulted who recommended a repeat colonoscopy which will be done on 11/12/2018. Her hemoglobin continues to drift down we will go ahead and transfuse 2 units of packed red blood cells  DVT prophylaxis: SCD Family Communication:none Disposition Plan/Barrier to D/C: Keep in stepdown unit Code Status:     Code Status Orders  (From admission, onward)        Start     Ordered   11/03/17 2152  Limited resuscitation (code)  Continuous    Question Answer Comment  In the event of cardiac or respiratory ARREST: Initiate Code Blue, Call Rapid Response Yes   In the event of cardiac or respiratory ARREST: Perform CPR No   In the event of cardiac or respiratory ARREST: Perform Intubation/Mechanical Ventilation Yes   In the event of cardiac or respiratory ARREST: Use NIPPV/BiPAp only if indicated Yes   In the event of cardiac or respiratory ARREST: Administer ACLS medications if indicated Yes   In the event of cardiac or respiratory ARREST: Perform Defibrillation or Cardioversion if indicated Yes      11/03/17 2152    Code Status History    Date Active Date Inactive Code Status Order ID Comments User Context   This patient has a current code status but no historical code status.        IV Access:     Peripheral IV   Procedures and diagnostic studies:   No results found.   Medical Consultants:    None.  Anti-Infectives:   None  Subjective:    [name] rating her diet feels with a little bit more energy. No bloody BM overnight.  Objective:    Vitals:   11/12/17 0300 11/12/17 0354 11/12/17 0400 11/12/17 0600  BP:   (!) 97/32 (!) 128/51  Pulse: 86  88 92  Resp: 17  19 15   Temp:  98.5 F (36.9 C)  98.3 F (36.8 C)  TempSrc:  Oral  Oral  SpO2: 93%  92% 95%  Weight:      Height:        Intake/Output Summary (Last 24 hours) at 11/12/2017 0757 Last data filed at 11/12/2017 0600 Gross per 24 hour  Intake 506.17 ml  Output 752 ml  Net -245.83 ml   Filed Weights   11/03/17 2233 11/08/17 1008 11/09/17 0710  Weight: 70.4 kg (155 lb 3.3 oz) 70.3 kg (155 lb) 70.3 kg (155 lb)    Exam: General exam: In no acute distress. Respiratory system: Good air movement clear to auscultation. Cardiovascular system: Regular with positive S1-S2. Gastrointestinal system: Positive bowel sounds soft nontender nondistended Central nervous system: Awake alert and oriented 3 Extremities: No extremity edema. Skin: No rashes, lesions or ulcers Psychiatry: Judgement and insight appear normal. Mood & affect appropriate.    Data Reviewed:    Labs: Basic Metabolic  Panel: No results for input(s): NA, K, CL, CO2, GLUCOSE, BUN, CREATININE, CALCIUM, MG, PHOS in the last 168 hours. GFR Estimated Creatinine Clearance: 38.5 mL/min (by C-G formula based on SCr of 0.84 mg/dL). Liver Function Tests: No results for input(s): AST, ALT, ALKPHOS, BILITOT, PROT, ALBUMIN in the last 168 hours. No results for input(s): LIPASE, AMYLASE in the last 168 hours. No results for input(s): AMMONIA in the last 168 hours. Coagulation profile No results for input(s): INR, PROTIME in the last 168 hours.  CBC: Recent Labs  Lab 11/07/17 1730  11/10/17 1626 11/10/17 1917 11/11/17 0643  11/11/17 1449 11/11/17 2031 11/12/17 0241  WBC  --   --   --   --   --   --  11.6* 11.2*  HGB 8.9*   < > 8.1* 9.0* 7.8* 7.8* 7.3* 6.1*  HCT 25.4*  --  24.1*  --   --  23.5* 22.0* 18.3*  MCV  --   --   --   --   --   --  94.8 94.3  PLT  --   --   --   --   --   --  237 206   < > = values in this interval not displayed.   Cardiac Enzymes: No results for input(s): CKTOTAL, CKMB, CKMBINDEX, TROPONINI in the last 168 hours. BNP (last 3 results) No results for input(s): PROBNP in the last 8760 hours. CBG: Recent Labs  Lab 11/06/17 0309 11/06/17 0759 11/06/17 1122 11/06/17 1524 11/09/17 0732  GLUCAP 101* 94 91 89 80   D-Dimer: No results for input(s): DDIMER in the last 72 hours. Hgb A1c: No results for input(s): HGBA1C in the last 72 hours. Lipid Profile: No results for input(s): CHOL, HDL, LDLCALC, TRIG, CHOLHDL, LDLDIRECT in the last 72 hours. Thyroid function studies: No results for input(s): TSH, T4TOTAL, T3FREE, THYROIDAB in the last 72 hours.  Invalid input(s): FREET3 Anemia work up: No results for input(s): VITAMINB12, FOLATE, FERRITIN, TIBC, IRON, RETICCTPCT in the last 72 hours. Sepsis Labs: Recent Labs  Lab 11/11/17 2031 11/12/17 0241  WBC 11.6* 11.2*   Microbiology Recent Results (from the past 240 hour(s))  MRSA PCR Screening     Status: None   Collection Time: 11/03/17 10:10 PM  Result Value Ref Range Status   MRSA by PCR NEGATIVE NEGATIVE Final    Comment:        The GeneXpert MRSA Assay (FDA approved for NASAL specimens only), is one component of a comprehensive MRSA colonization surveillance program. It is not intended to diagnose MRSA infection nor to guide or monitor treatment for MRSA infections.      Medications:   . dorzolamide-timolol  1 drop Both Eyes BID  . DULoxetine  20 mg Oral Daily  . folic acid  932 mcg Oral Daily  . hydrocortisone   Rectal BID  . latanoprost  1 drop Both Eyes QHS  . loratadine  10 mg Oral Daily  .  pantoprazole (PROTONIX) IV  40 mg Intravenous Q24H  . vitamin B-12  1,000 mcg Oral Daily   Continuous Infusions: . sodium chloride    . sodium chloride    . sodium chloride    . dextrose 5 % and 0.9 % NaCl with KCl 20 mEq/L Stopped (11/11/17 0837)      LOS: 9 days   [facility] Pager [phone]  *Please refer to [email or web address], password [email or web address] to get updated schedule on who will round on this  patient, as hospitalists switch teams weekly. If 7PM-7AM, please contact night-coverage at [email or web address], password [email or web address] for any overnight needs.  11/12/2017, 7:57 AM

## 2017-11-12 NOTE — Op Note (Signed)
[facility] Patient Name: [name] Procedure Date: 11/12/2017 MRN: [id number] Attending MD: Ladene Artist , MD Date of Birth: 06/06/1921 CSN: [id number] Age: 83 Admit Type: Inpatient Procedure:                Colonoscopy Indications:              Hematochezia Providers:                Pricilla Riffle. Fuller Plan, MD, Laverta Baltimore RN, RN, Tinnie Gens, Technician, [place] CRNA Referring MD:             [facility] Medicines:                Monitored Anesthesia Care Complications:            No immediate complications. Estimated blood loss:                            None. Estimated Blood Loss:     Estimated blood loss: none. Procedure:                Pre-Anesthesia Assessment:                           - Prior to the procedure, a History and Physical                            was performed, and patient medications and                            allergies were reviewed. The patient's tolerance of                            previous anesthesia was also reviewed. The risks                            and benefits of the procedure and the sedation                            options and risks were discussed with the patient.                            All questions were answered, and informed consent                            was obtained. Prior Anticoagulants: The patient has                            taken no previous anticoagulant or antiplatelet                            agents. ASA Grade Assessment: III - A patient with  severe systemic disease. After reviewing the risks                            and benefits, the patient was deemed in                            satisfactory condition to undergo the procedure.                           After obtaining informed consent, the colonoscope                            was passed under direct vision. Throughout the                            procedure, the  patient's blood pressure, pulse, and                            oxygen saturations were monitored continuously. The                            EC-[id number] ([id number]) scope was introduced through                            the anus and advanced to the the cecum, identified                            by appendiceal orifice and ileocecal valve. The                            ileocecal valve, appendiceal orifice, and rectum                            were photographed. The quality of the bowel                            preparation was 30 percent obscured. The                            colonoscopy was performed without difficulty. The                            patient tolerated the procedure well. Scope In: 1:03:24 PM Scope Out: 1:33:00 PM Scope Withdrawal Time: 0 hours 17 minutes 45 seconds  Total Procedure Duration: 0 hours 29 minutes 36 seconds  Findings:      The perianal and digital rectal examinations were normal.      Multiple small and large-mouthed diverticula were found in the sigmoid       colon, descending colon and transverse colon. There was evidence of       recent bleeding with an adherent clot from the diverticular opening in       the distal transverse colon. 2 previously placed clips were noted of the       3 placed at that site. For hemostasis, 1 hemostatic clip  was       successfully placed (MR conditional) with very good coverage of the       suspected bleeding site. There was no bleeding at the end of the       procedure. Blood clots in several areas of the transverse, descending,       sigmoid obscured some of the exam.      Internal hemorrhoids were found during retroflexion. The hemorrhoids       were medium-sized and Grade I (internal hemorrhoids that do not       prolapse).      The exam was otherwise without abnormality on direct and retroflexion       views. Impression:               - Severe diverticulosis in the sigmoid colon, in                             the descending colon and in the transverse colon.                            There was evidence of recent bleeding from the                            diverticular opening in the transverse colon. Two                            previously placed clips were noted. One clip (MR                            conditional) was placed at the site with very good                            coverage of the suspected bleeding site.                           - Internal hemorrhoids.                           - The examination was otherwise normal on direct                            and retroflexion views however limted exam due to                            blood clots.                           - No specimens collected. Moderate Sedation:      N/A- Per Anesthesia Care Recommendation:           - Patient has a contact number available for                            emergencies. The signs and symptoms of potential  delayed complications were discussed with the                            patient. Return to normal activities tomorrow.                            Written discharge instructions were provided to the                            patient.                           - Continue present medications.                           - No aspirin, ibuprofen, naproxen, or other                            non-steroidal anti-inflammatory drugs for 2 weeks.                            Avoid anticoagulants for 2 weeks.                           - Full liquid diet today.                           - No repeat colonoscopy due to age.                           - Outpatient GI follow up with Dr. Barney Drain                           - Consult general surgery and IR for possible                            angiogram if she has recurrent bleeding. Procedure Code(s):        --- Professional ---                           [phone], Colonoscopy, flexible; with control of                             bleeding, any method Diagnosis Code(s):        --- Professional ---                           K64.0, First degree hemorrhoids                           K57.31, Diverticulosis of large intestine without                            perforation or abscess with bleeding  K92.1, Melena (includes Hematochezia) CPT copyright 2016 American Medical Association. All rights reserved. The codes documented in this report are preliminary and upon coder review may  be revised to meet current compliance requirements. Ladene Artist, MD 11/12/2017 1:53:47 PM This report has been signed electronically. Number of Addenda: 0

## 2017-11-12 NOTE — Transfer of Care (Signed)
Immediate Anesthesia Transfer of Care Note  Patient: [name]  Procedure(s) Performed: COLONOSCOPY WITH PROPOFOL (N/A )  Patient Location: PACU  Anesthesia Type:MAC  Level of Consciousness: sedated  Airway & Oxygen Therapy: Patient Spontanous Breathing and Patient connected to nasal cannula oxygen  Post-op Assessment: Report given to RN and Post -op Vital signs reviewed and stable  Post vital signs: Reviewed and stable  Last Vitals:  Vitals:   11/12/17 1200 11/12/17 1212  BP: 137/74 (!) 145/75  Pulse:  86  Resp: (!) 25 (!) 25  Temp:  36.8 C  SpO2:  97%    Last Pain:  Vitals:   11/12/17 1212  TempSrc: Oral  PainSc:          Complications: No apparent anesthesia complications

## 2017-11-12 NOTE — Interval H&P Note (Signed)
History and Physical Interval Note:  11/12/2017 12:30 PM  [name]  has presented today for surgery, with the diagnosis of hemtochezia  The various methods of treatment have been discussed with the patient and family. After consideration of risks, benefits and other options for treatment, the patient has consented to  Procedure(s): COLONOSCOPY WITH PROPOFOL (N/A) as a surgical intervention .  The patient's history has been reviewed, patient examined, no change in status, stable for surgery.  I have reviewed the patient's chart and labs.  Questions were answered to the patient's satisfaction.     Pricilla Riffle. Fuller Plan

## 2017-11-12 NOTE — Progress Notes (Signed)
Nutrition Brief Note  Patient identified for LOS (9 days).  Wt Readings from Last 15 Encounters:  11/12/17 155 lb (70.3 kg)  10/10/17 155 lb 6.4 oz (70.5 kg)  09/19/17 153 lb (69.4 kg)  08/22/17 153 lb 6.4 oz (69.6 kg)  08/01/17 154 lb 12.8 oz (70.2 kg)  07/04/17 158 lb (71.7 kg)  06/04/17 155 lb 11.2 oz (70.6 kg)  05/15/17 156 lb 3.2 oz (70.9 kg)  05/06/17 151 lb 9.6 oz (68.8 kg)  04/22/17 151 lb 6.4 oz (68.7 kg)  04/15/17 151 lb 6.4 oz (68.7 kg)  04/12/17 151 lb 6.4 oz (68.7 kg)  04/08/17 153 lb (69.4 kg)  04/03/17 155 lb (70.3 kg)  11/29/16 155 lb (70.3 kg)    Body mass index is 27.46 kg/m. Patient meets criteria for overweight, appropriate for advanced age based on current BMI. Skin WDL. Pt with PMH of diverticular bleed in 2012. She was admitted on 11/18 for sudden, persistent, worsening BRBPR for 3 episodes PTA. She had EGD which was unrevealing. She underwent colonoscopy on 11/24 and source of bleeding was identified at that time; pt experienced repeat bleeding 24 hours after colonoscopy.   Current diet order is NPO for repeat colonoscopy. Pt was previously on Soft diet with no documented intakes. Tech reports that pt has all of her teeth.  Medications reviewed; 0.5 mg oral folic acid/day, 40 mg IV Protonix/day, 1000 mcg oral vitamin B12/day.   Labs reviewed; no BMP available.  No nutrition interventions warranted at this time. If nutrition issues arise, please consult RD.     Jarome Matin, MS, RD, LDN, [facility] Inpatient Clinical Dietitian Pager # [phone] After hours/weekend pager # [phone]

## 2017-11-12 NOTE — Care Management Note (Signed)
Case Management Note  Patient Details  Name: [name] MRN: [id number] Date of Birth: 05/13/1921  Subjective/Objective:                  hematochezia  Action/Plan: Date: November 12, 2017 Velva Harman, BSN, [place], [place] Chart and notes review for patient progress and needs. Will follow for case management and discharge needs. Next review date: [id number]  Expected Discharge Date:                  Expected Discharge Plan:  Home/Self Care  In-House Referral:     Discharge planning Services  CM Consult  Post Acute Care Choice:    Choice offered to:     DME Arranged:    DME Agency:     HH Arranged:    HH Agency:     Status of Service:  In process, will continue to follow  If discussed at Long Length of Stay Meetings, dates discussed:    Additional Comments:  Leeroy Cha, RN 11/12/2017, 8:17 AM

## 2017-11-12 NOTE — Anesthesia Preprocedure Evaluation (Signed)
Anesthesia Evaluation  Patient identified by MRN, date of birth, ID band Patient awake    Reviewed: Allergy & Precautions, NPO status , Patient's Chart, lab work & pertinent test results, reviewed documented beta blocker date and time   History of Anesthesia Complications (+) history of anesthetic complications ("slow to wake up")  Airway Mallampati: I  TM Distance: >3 FB Neck ROM: Full    Dental  (+) Dental Advisory Given, Caps   Pulmonary  Lung cancer   breath sounds clear to auscultation       Cardiovascular hypertension, Pt. on home beta blockers (-) angina+ dysrhythmias Supra Ventricular Tachycardia  Rhythm:Regular Rate:Normal     Neuro/Psych PSYCHIATRIC DISORDERS Anxiety Depression negative neurological ROS     GI/Hepatic Neg liver ROS, GERD  Medicated, hemtochezia   Endo/Other  diabetes, Type 2  Renal/GU negative Renal ROS   OAB     Musculoskeletal  (+) Arthritis ,   Abdominal   Peds  Hematology  (+) Blood dyscrasia, anemia ,   Anesthesia Other Findings Day of surgery medications reviewed with the patient.  Reproductive/Obstetrics                             Anesthesia Physical  Anesthesia Plan  ASA: III  Anesthesia Plan: MAC   Post-op Pain Management:    Induction:   PONV Risk Score and Plan: 2 and Ondansetron, Treatment may vary due to age or medical condition and Propofol infusion  Airway Management Planned: Natural Airway and Nasal Cannula  Additional Equipment:   Intra-op Plan:   Post-operative Plan:   Informed Consent: I have reviewed the patients History and Physical, chart, labs and discussed the procedure including the risks, benefits and alternatives for the proposed anesthesia with the patient or authorized representative who has indicated his/her understanding and acceptance.   Dental advisory given  Plan Discussed with: CRNA and  Surgeon  Anesthesia Plan Comments: (Plan routine monitors, MAC)        Anesthesia Quick Evaluation

## 2017-11-13 LAB — HEMOGLOBIN: HEMOGLOBIN: 8.6 g/dL — AB (ref 12.0–15.0)

## 2017-11-13 MED ORDER — ONDANSETRON HCL 4 MG/2ML IJ SOLN: 4.0000 mg | Freq: Once | INTRAMUSCULAR | Status: DC | PRN

## 2017-11-13 MED ORDER — FENTANYL CITRATE (PF) 100 MCG/2ML IJ SOLN: 25.0000 ug | INTRAMUSCULAR | Status: DC | PRN

## 2017-11-13 NOTE — Progress Notes (Signed)
     [facility] Progress Note  Chief Complaint:    Rectal bleeding  Subjective: No further bleeding but SOB and tachycardic just with transitioning from bed to chair.  Objective:  Vital signs in last 24 hours: Temp:  [98.1 F (36.7 C)-98.8 F (37.1 C)] 98.3 F (36.8 C) (11/28 0800) Pulse Rate:  [71-99] 80 (11/28 0400) Resp:  [18-33] 33 (11/28 0400) BP: (97-171)/(47-94) 97/50 (11/28 0400) SpO2:  [93 %-100 %] 95 % (11/28 0400) Weight:  [155 lb (70.3 kg)] 155 lb (70.3 kg) (11/27 1212) Last BM Date: 11/07/17 General:   Alert, well-developed, elderly, frail, white female  in NAD EENT:  Normal hearing, non icteric sclera, conjunctive pink.  Heart:  irreg rate and rhythm, no lower extremity edema Pulm: Slightly labored breathing, just transitioned to chair. No wheezes or crackles. Abdomen:  Soft, nondistended, nontender.  Normal bowel sounds, no masses felt. No hepatomegaly.    Neurologic:  Alert and  oriented x4;  grossly normal neurologically. Psych:  Pleasant, cooperative.  Normal mood and affect.   Intake/Output from previous day: 11/27 0701 - 11/28 0700 In: 2050 [P.O.:840; I.V.:550; Blood:660] Out: 650 [Urine:650] Intake/Output this shift: Total I/O In: 360 [P.O.:360] Out: 800 [Urine:800]  Lab Results: Recent Labs    11/12/17 0241 11/12/17 1446 11/12/17 1934 11/13/17 0719  WBC 11.2* 9.1 10.8*  --   HGB 6.1* 8.9* 8.3* 8.6*  HCT 18.3* 26.7* 24.3*  --   PLT 206 212 202  --     ASSESSMENT / PLAN:   83 yo female with a diverticular hemorrhage. She had recurrent bleeding two days ago requiring repeat colonoscopy yesterday.  Two of the three previously placed hemoclips at site of previous bleed in transverse colon were still in place. There was evidence for recent bleeding at the same site, a third clip was placed. No further bleeding. Hgb stable overnight 8.3 >>>8.6.   Discharge Planning Diet: No GI restrictions Anticoagulation and antiplatelets:   N/A Discharge Medications: No changes from GI standpoint   Principal Problem:   Hematochezia Active Problems:   Acute blood loss anemia   Diverticulosis of colon with hemorrhage   Melena    LOS: 10 days   Tye Savoy ,NP 11/13/2017, 9:43 AM  Pager number [phone]    Attending physician's note   I have taken an interval history, reviewed the chart and examined the patient. I agree with the Advanced Practitioner's note, impression and recommendations. Resolving diverticular bleed. No bowel movements since bowel prep. Hb stable. Advance to soft diet. From a GI standpoint, if no recurrent bleeding for another 24-48 hours, will be ok for discharge. Call if additional questions. GI signing off. Outpatient GI follow up as needed with Dr.Sandi Fields.   Lucio Edward, MD [facility] [phone] Mon-Fri 8a-5p [phone] after 5p, weekends, holidays

## 2017-11-13 NOTE — Anesthesia Postprocedure Evaluation (Signed)
Anesthesia Post Note  Patient: [name]  Procedure(s) Performed: COLONOSCOPY WITH PROPOFOL (N/A )     Patient location during evaluation: PACU Anesthesia Type: MAC Level of consciousness: awake and alert Pain management: pain level controlled Vital Signs Assessment: post-procedure vital signs reviewed and stable Respiratory status: spontaneous breathing, nonlabored ventilation and respiratory function stable Cardiovascular status: stable and blood pressure returned to baseline Postop Assessment: no apparent nausea or vomiting Anesthetic complications: no    Last Vitals:  Vitals:   11/13/17 0000 11/13/17 0400  BP: (!) 117/47 (!) 97/50  Pulse: 92 80  Resp: (!) 23 (!) 33  Temp: 37 C 36.7 C  SpO2: 93% 95%    Last Pain:  Vitals:   11/13/17 0400  TempSrc: Oral  PainSc:                  Catalina Gravel

## 2017-11-13 NOTE — Progress Notes (Signed)
[facility]  PROGRESS NOTE  [name] WCH:[id number] DOB: 08-24-1921 DOA: 11/03/2017 PCP: Blanchie Serve, MD   Brief HPI:   83 y.o. female past medical history of diverticular bleed in 2012 presents with hematochezia started on the day of admission GI has been consulted   Subjective   Patient seen and examined, denies rectal bleeding.   Assessment/Plan:     1. Hematochezia-nuclear scan negative, EGD showed no source of bleeding.  Status post colonoscopy on 11/09/2017 with source of bleeding identified.  Colonoscopy showed diverticular bleed status post clipping.  Patient again re-bled after 24 hours.  Repeat colonoscopy was done on 11/12/2017, 2 of the 3 previously placed hemoclips at site of previous bleed and transverse colon was still in place.  There was evidence of recent bleed at the same site and a third clip was placed.  2. Anemia-hemoglobin has a stable 8.6 today.  Will check CBC in a.m.    DVT prophylaxis: SCDs  Code Status: Partial code  Family Communication: No family bedside  Disposition Plan: Likely home in 1-2 days   Consultants:  Gastroenterology  Procedures:  Colonoscopy  Continuous infusions . sodium chloride    . sodium chloride    . sodium chloride    . dextrose 5 % and 0.9 % NaCl with KCl 20 mEq/L Stopped (11/11/17 3536)      Antibiotics:   Anti-infectives (From admission, onward)   None       Objective   Vitals:   11/13/17 1100 11/13/17 1200 11/13/17 1300 11/13/17 1400  BP:  (!) 114/56    Pulse: (!) 112 (!) 105 (!) 102 (!) 104  Resp: (!) 22 (!) 26 (!) 26 19  Temp:      TempSrc:      SpO2: 97% 97% 96% 95%  Weight:      Height:        Intake/Output Summary (Last 24 hours) at 11/13/2017 1557 Last data filed at 11/13/2017 0940 Gross per 24 hour  Intake 1200 ml  Output 1050 ml  Net 150 ml   Filed Weights   11/08/17 1008 11/09/17 0710 11/12/17 1212  Weight: 70.3 kg (155 lb) 70.3 kg (155 lb) 70.3 kg (155 lb)      Physical Examination:   Physical Exam: Eyes: No icterus, extraocular muscles intact  Mouth: Oral mucosa is moist, no lesions on palate,  Neck: Supple, no deformities, masses, or tenderness Lungs: Normal respiratory effort, bilateral clear to auscultation, no crackles or wheezes.  Heart: Regular rate and rhythm, S1 and S2 normal, no murmurs, rubs auscultated Abdomen: BS normoactive,soft,nondistended,non-tender to palpation,no organomegaly Extremities: No pretibial edema, no erythema, no cyanosis, no clubbing Neuro : Alert and oriented to time, place and person, No focal deficits  Skin: No rashes seen on exam     Data Reviewed: I have personally reviewed following labs and imaging studies  CBG: Recent Labs  Lab 11/09/17 0732  GLUCAP 80    CBC: Recent Labs  Lab 11/11/17 1449 11/11/17 2031 11/12/17 0241 11/12/17 1446 11/12/17 1934 11/13/17 0719  WBC  --  11.6* 11.2* 9.1 10.8*  --   HGB 7.8* 7.3* 6.1* 8.9* 8.3* 8.6*  HCT 23.5* 22.0* 18.3* 26.7* 24.3*  --   MCV  --  94.8 94.3 87.5 86.8  --   PLT  --  237 206 212 202  --     Basic Metabolic Panel: No results for input(s): NA, K, CL, CO2, GLUCOSE, BUN, CREATININE, CALCIUM, MG, PHOS in the last  168 hours.  Recent Results (from the past 240 hour(s))  MRSA PCR Screening     Status: None   Collection Time: 11/03/17 10:10 PM  Result Value Ref Range Status   MRSA by PCR NEGATIVE NEGATIVE Final    Comment:        The GeneXpert MRSA Assay (FDA approved for NASAL specimens only), is one component of a comprehensive MRSA colonization surveillance program. It is not intended to diagnose MRSA infection nor to guide or monitor treatment for MRSA infections.      Liver Function Tests: No results for input(s): AST, ALT, ALKPHOS, BILITOT, PROT, ALBUMIN in the last 168 hours. No results for input(s): LIPASE, AMYLASE in the last 168 hours. No results for input(s): AMMONIA in the last 168 hours.  Cardiac Enzymes: No  results for input(s): CKTOTAL, CKMB, CKMBINDEX, TROPONINI in the last 168 hours. BNP (last 3 results) No results for input(s): BNP in the last 8760 hours.  ProBNP (last 3 results) No results for input(s): PROBNP in the last 8760 hours.    Studies: No results found.  Scheduled Meds: . dorzolamide-timolol  1 drop Both Eyes BID  . DULoxetine  20 mg Oral Daily  . folic acid  470 mcg Oral Daily  . hydrocortisone   Rectal BID  . latanoprost  1 drop Both Eyes QHS  . loratadine  10 mg Oral Daily  . pantoprazole (PROTONIX) IV  40 mg Intravenous Q24H  . vitamin B-12  1,000 mcg Oral Daily      Time spent: 25 minutes  [facility] Pager [phone]. If 7PM-7AM, please contact night-coverage at [email or web address], Office  [phone]  password [email or web address]  11/13/2017, 3:57 PM  LOS: 10 days

## 2017-11-13 NOTE — Evaluation (Signed)
Physical Therapy Evaluation Patient Details Name: [name] MRN: [id number] DOB: 1921/03/17 Today's Date: 11/13/2017   History of Present Illness  Pt admitted from [facility] with GIB and hx of DM, Lung CA, Breast CA, bil THR, L UE lymphedema  Clinical Impression  Pt admitted as above and presenting with functional mobility limitations 2* generalized weakness and balance deficits.  Pt would benefit from follow up rehab at SNF level to maximize IND and safety prior to return to IND living.    Follow Up Recommendations SNF    Equipment Recommendations  None recommended by PT    Recommendations for Other Services       Precautions / Restrictions Precautions Precautions: Fall Restrictions Weight Bearing Restrictions: No      Mobility  Bed Mobility Overal bed mobility: Needs Assistance Bed Mobility: Supine to Sit     Supine to sit: Min assist;Mod assist     General bed mobility comments: cues for log roll and physical assist to bring trunk to upright sitting  Transfers Overall transfer level: Needs assistance Equipment used: Rolling walker (2 wheeled) Transfers: Sit to/from Stand Sit to Stand: Min assist;Mod assist;+2 physical assistance;+2 safety/equipment;From elevated surface         General transfer comment: cues for use of UEs to self assist and physical assist to bring wt up and fwd  Ambulation/Gait Ambulation/Gait assistance: Min assist;Mod assist;+2 physical assistance;+2 safety/equipment Ambulation Distance (Feet): 4 Feet Assistive device: Rolling walker (2 wheeled) Gait Pattern/deviations: Step-to pattern;Decreased step length - right;Decreased step length - left;Shuffle;Trunk flexed Gait velocity: decr Gait velocity interpretation: Below normal speed for age/gender General Gait Details: cues for posture, position from RW and sequence with physical assist to manage RW and for support/balance.  Distance ltd by elevating HR  Stairs             Wheelchair Mobility    Modified Rankin (Stroke Patients Only)       Balance Overall balance assessment: Needs assistance Sitting-balance support: Feet supported;No upper extremity supported Sitting balance-Leahy Scale: Fair     Standing balance support: Bilateral upper extremity supported Standing balance-Leahy Scale: Poor                               Pertinent Vitals/Pain Pain Assessment: Faces Faces Pain Scale: Hurts little more Pain Location: L foot with WB    Home Living Family/patient expects to be discharged to:: Skilled nursing facility                 Additional Comments: Rehab setting at [facility]    Prior Function Level of Independence: Independent with assistive device(s)         Comments: Pt from [place] and used RW to assist     Hand Dominance        Extremity/Trunk Assessment   Upper Extremity Assessment Upper Extremity Assessment: Overall WFL for tasks assessed    Lower Extremity Assessment Lower Extremity Assessment: Generalized weakness    Cervical / Trunk Assessment Cervical / Trunk Assessment: Kyphotic  Communication   Communication: No difficulties  Cognition Arousal/Alertness: Awake/alert Behavior During Therapy: Anxious;WFL for tasks assessed/performed Overall Cognitive Status: Within Functional Limits for tasks assessed                                        General Comments  Exercises General Exercises - Lower Extremity Ankle Circles/Pumps: AROM;Both;20 reps;Supine   Assessment/Plan    PT Assessment Patient needs continued PT services  PT Problem List Decreased strength;Decreased range of motion;Decreased activity tolerance;Decreased balance;Decreased mobility;Decreased knowledge of use of DME;Pain       PT Treatment Interventions DME instruction;Gait training;Functional mobility training;Therapeutic activities;Therapeutic exercise;Balance training;Patient/family  education    PT Goals (Current goals can be found in the Care Plan section)  Acute Rehab PT Goals Patient Stated Goal: Regain IND PT Goal Formulation: With patient Time For Goal Achievement: 11/23/17 Potential to Achieve Goals: Fair    Frequency Min 3X/week   Barriers to discharge        Co-evaluation               AM-PAC PT "6 Clicks" Daily Activity  Outcome Measure Difficulty turning over in bed (including adjusting bedclothes, sheets and blankets)?: A Lot Difficulty moving from lying on back to sitting on the side of the bed? : Unable Difficulty sitting down on and standing up from a chair with arms (e.g., wheelchair, bedside commode, etc,.)?: Unable Help needed moving to and from a bed to chair (including a wheelchair)?: A Lot Help needed walking in hospital room?: A Lot Help needed climbing 3-5 steps with a railing? : Total 6 Click Score: 9    End of Session Equipment Utilized During Treatment: Gait belt Activity Tolerance: Patient limited by fatigue;Other (comment) Patient left: in chair;with call bell/phone within reach;with nursing/sitter in room;with chair alarm set Nurse Communication: Mobility status PT Visit Diagnosis: Unsteadiness on feet (R26.81);Muscle weakness (generalized) (M62.81);Difficulty in walking, not elsewhere classified (R26.2);Pain Pain - Right/Left: Left Pain - part of body: Ankle and joints of foot    Time: 5462-7035 PT Time Calculation (min) (ACUTE ONLY): 27 min   Charges:   PT Evaluation $PT Eval Moderate Complexity: 1 Mod PT Treatments $Gait Training: 8-22 mins   PT G Codes:        Pg [phone]   Brena Windsor 11/13/2017, 12:48 PM

## 2017-11-14 ENCOUNTER — Encounter (HOSPITAL_COMMUNITY): Payer: Self-pay | Admitting: Gastroenterology

## 2017-11-14 LAB — CBC
HEMATOCRIT: 22.9 % — AB (ref 36.0–46.0)
HEMOGLOBIN: 7.6 g/dL — AB (ref 12.0–15.0)
MCH: 29.5 pg (ref 26.0–34.0)
MCHC: 33.2 g/dL (ref 30.0–36.0)
MCV: 88.8 fL (ref 78.0–100.0)
Platelets: 235 10*3/uL (ref 150–400)
RBC: 2.58 MIL/uL — AB (ref 3.87–5.11)
RDW: 18.2 % — AB (ref 11.5–15.5)
WBC: 10.2 10*3/uL (ref 4.0–10.5)

## 2017-11-14 LAB — HEMOGLOBIN AND HEMATOCRIT, BLOOD
HCT: 29.6 % — ABNORMAL LOW (ref 36.0–46.0)
Hemoglobin: 9.8 g/dL — ABNORMAL LOW (ref 12.0–15.0)

## 2017-11-14 LAB — PREPARE RBC (CROSSMATCH)

## 2017-11-14 MED ORDER — SODIUM CHLORIDE 0.9 % IV SOLN
Freq: Once | INTRAVENOUS | Status: AC
  Administered 2017-11-14: 09:00:00 via INTRAVENOUS

## 2017-11-14 NOTE — Progress Notes (Signed)
[facility]  PROGRESS NOTE  [name] QQI:[id number] DOB: 1921-08-23 DOA: 11/03/2017 PCP: Blanchie Serve, MD   Brief HPI:   83 y.o. female past medical history of diverticular bleed in 2012 presents with hematochezia started on the day of admission GI has been consulted   Subjective   Patient seen and examined, denies rectal bleeding.  Hemoglobin dropped to 7.6 this morning   Assessment/Plan:     1. Hematochezia-nuclear scan negative, EGD showed no source of bleeding.  Status post colonoscopy on 11/09/2017 with source of bleeding identified.  Colonoscopy showed diverticular bleed status post clipping.  Patient again re-bled after 24 hours.  Repeat colonoscopy was done on 11/12/2017, 2 of the 3 previously placed hemoclips at site of previous bleed and transverse colon was still in place.  There was evidence of recent bleed at the same site and a third clip was placed.  2. Anemia-hemoglobin is dropped to 7.6 today.  Will transfuse 1 unit PRBC.  Check CBC in a.m.    DVT prophylaxis: SCDs  Code Status: Partial code  Family Communication: No family bedside  Disposition Plan: Skilled nursing facility  Consultants:  Gastroenterology  Procedures:  Colonoscopy  Continuous infusions . sodium chloride    . sodium chloride    . sodium chloride    . dextrose 5 % and 0.9 % NaCl with KCl 20 mEq/L Stopped (11/11/17 9417)      Antibiotics:   Anti-infectives (From admission, onward)   None       Objective   Vitals:   11/14/17 1000 11/14/17 1100 11/14/17 1145 11/14/17 1200  BP:   (!) 145/59 136/67  Pulse: 97 97 88 87  Resp: 14 19 (!) 22 (!) 22  Temp:   97.7 F (36.5 C) 98.4 F (36.9 C)  TempSrc:   Oral Oral  SpO2: 96% 93% 94% 95%  Weight:      Height:        Intake/Output Summary (Last 24 hours) at 11/14/2017 1321 Last data filed at 11/14/2017 1145 Gross per 24 hour  Intake 315 ml  Output 900 ml  Net -585 ml   Filed Weights   11/08/17 1008 11/09/17  0710 11/12/17 1212  Weight: 70.3 kg (155 lb) 70.3 kg (155 lb) 70.3 kg (155 lb)     Physical Examination:   Physical Exam: Eyes: No icterus, extraocular muscles intact  Mouth: Oral mucosa is moist, no lesions on palate,  Neck: Supple, no deformities, masses, or tenderness Lungs: Normal respiratory effort, bilateral clear to auscultation, no crackles or wheezes.  Heart: Regular rate and rhythm, S1 and S2 normal, no murmurs, rubs auscultated Abdomen: BS normoactive,soft,nondistended,non-tender to palpation,no organomegaly Extremities: No pretibial edema, no erythema, no cyanosis, no clubbing Neuro : Alert and oriented to time, place and person, No focal deficits  Skin: No rashes seen on exam     Data Reviewed: I have personally reviewed following labs and imaging studies  CBG: Recent Labs  Lab 11/09/17 0732  GLUCAP 80    CBC: Recent Labs  Lab 11/11/17 2031 11/12/17 0241 11/12/17 1446 11/12/17 1934 11/13/17 0719 11/14/17 0334  WBC 11.6* 11.2* 9.1 10.8*  --  10.2  HGB 7.3* 6.1* 8.9* 8.3* 8.6* 7.6*  HCT 22.0* 18.3* 26.7* 24.3*  --  22.9*  MCV 94.8 94.3 87.5 86.8  --  88.8  PLT 237 206 212 202  --  408    Basic Metabolic Panel: No results for input(s): NA, K, CL, CO2, GLUCOSE, BUN, CREATININE, CALCIUM, MG,  PHOS in the last 168 hours.  No results found for this or any previous visit (from the past 240 hour(s)).   Liver Function Tests: No results for input(s): AST, ALT, ALKPHOS, BILITOT, PROT, ALBUMIN in the last 168 hours. No results for input(s): LIPASE, AMYLASE in the last 168 hours. No results for input(s): AMMONIA in the last 168 hours.  Cardiac Enzymes: No results for input(s): CKTOTAL, CKMB, CKMBINDEX, TROPONINI in the last 168 hours. BNP (last 3 results) No results for input(s): BNP in the last 8760 hours.  ProBNP (last 3 results) No results for input(s): PROBNP in the last 8760 hours.    Studies: No results found.  Scheduled Meds: .  dorzolamide-timolol  1 drop Both Eyes BID  . DULoxetine  20 mg Oral Daily  . folic acid  390 mcg Oral Daily  . hydrocortisone   Rectal BID  . latanoprost  1 drop Both Eyes QHS  . loratadine  10 mg Oral Daily  . pantoprazole (PROTONIX) IV  40 mg Intravenous Q24H  . vitamin B-12  1,000 mcg Oral Daily      Time spent: 25 minutes  [facility] Pager [phone]. If 7PM-7AM, please contact night-coverage at [email or web address], Office  [phone]  password [place]  11/14/2017, 1:21 PM  LOS: 11 days

## 2017-11-14 NOTE — Progress Notes (Signed)
CSW received call from patient w/ concerns about current condition and when she may discharge. She is worried about loosing her bed at SNF. CSW met with patient at bedside and provided emotional support and  confirm with [facility] liaison they will hold patient bed.  Patient more relieved to know they will hold her bed.   Kathrin Greathouse, Latanya Presser, MSW Clinical Social Worker  [phone] 11/14/2017  4:16 PM

## 2017-11-15 DIAGNOSIS — M6281 Muscle weakness (generalized): Secondary | ICD-10-CM | POA: Diagnosis not present

## 2017-11-15 DIAGNOSIS — K5720 Diverticulitis of large intestine with perforation and abscess without bleeding: Secondary | ICD-10-CM | POA: Diagnosis not present

## 2017-11-15 DIAGNOSIS — R5383 Other fatigue: Secondary | ICD-10-CM | POA: Diagnosis not present

## 2017-11-15 DIAGNOSIS — D1801 Hemangioma of skin and subcutaneous tissue: Secondary | ICD-10-CM | POA: Diagnosis not present

## 2017-11-15 DIAGNOSIS — J309 Allergic rhinitis, unspecified: Secondary | ICD-10-CM | POA: Diagnosis not present

## 2017-11-15 DIAGNOSIS — R1311 Dysphagia, oral phase: Secondary | ICD-10-CM | POA: Diagnosis not present

## 2017-11-15 DIAGNOSIS — K219 Gastro-esophageal reflux disease without esophagitis: Secondary | ICD-10-CM | POA: Diagnosis not present

## 2017-11-15 DIAGNOSIS — Z85828 Personal history of other malignant neoplasm of skin: Secondary | ICD-10-CM | POA: Diagnosis not present

## 2017-11-15 DIAGNOSIS — R2681 Unsteadiness on feet: Secondary | ICD-10-CM | POA: Diagnosis not present

## 2017-11-15 DIAGNOSIS — Z7189 Other specified counseling: Secondary | ICD-10-CM | POA: Diagnosis not present

## 2017-11-15 DIAGNOSIS — D500 Iron deficiency anemia secondary to blood loss (chronic): Secondary | ICD-10-CM | POA: Diagnosis not present

## 2017-11-15 DIAGNOSIS — R531 Weakness: Secondary | ICD-10-CM | POA: Diagnosis not present

## 2017-11-15 DIAGNOSIS — I482 Chronic atrial fibrillation: Secondary | ICD-10-CM | POA: Diagnosis not present

## 2017-11-15 DIAGNOSIS — E440 Moderate protein-calorie malnutrition: Secondary | ICD-10-CM | POA: Diagnosis not present

## 2017-11-15 DIAGNOSIS — K625 Hemorrhage of anus and rectum: Secondary | ICD-10-CM | POA: Diagnosis not present

## 2017-11-15 DIAGNOSIS — E871 Hypo-osmolality and hyponatremia: Secondary | ICD-10-CM | POA: Diagnosis not present

## 2017-11-15 DIAGNOSIS — K921 Melena: Secondary | ICD-10-CM | POA: Diagnosis not present

## 2017-11-15 DIAGNOSIS — K5731 Diverticulosis of large intestine without perforation or abscess with bleeding: Secondary | ICD-10-CM | POA: Diagnosis not present

## 2017-11-15 DIAGNOSIS — L821 Other seborrheic keratosis: Secondary | ICD-10-CM | POA: Diagnosis not present

## 2017-11-15 DIAGNOSIS — D649 Anemia, unspecified: Secondary | ICD-10-CM | POA: Diagnosis not present

## 2017-11-15 DIAGNOSIS — R7989 Other specified abnormal findings of blood chemistry: Secondary | ICD-10-CM | POA: Diagnosis not present

## 2017-11-15 DIAGNOSIS — D62 Acute posthemorrhagic anemia: Secondary | ICD-10-CM | POA: Diagnosis not present

## 2017-11-15 DIAGNOSIS — I10 Essential (primary) hypertension: Secondary | ICD-10-CM | POA: Diagnosis not present

## 2017-11-15 DIAGNOSIS — Z8719 Personal history of other diseases of the digestive system: Secondary | ICD-10-CM | POA: Diagnosis not present

## 2017-11-15 DIAGNOSIS — K922 Gastrointestinal hemorrhage, unspecified: Secondary | ICD-10-CM | POA: Diagnosis not present

## 2017-11-15 DIAGNOSIS — R1310 Dysphagia, unspecified: Secondary | ICD-10-CM | POA: Diagnosis not present

## 2017-11-15 DIAGNOSIS — N3946 Mixed incontinence: Secondary | ICD-10-CM | POA: Diagnosis not present

## 2017-11-15 LAB — BPAM RBC
BLOOD PRODUCT EXPIRATION DATE: 201812212359
BLOOD PRODUCT EXPIRATION DATE: 201812222359
Blood Product Expiration Date: 201812212359
ISSUE DATE / TIME: 201811270827
ISSUE DATE / TIME: 201811270528
ISSUE DATE / TIME: 201811290822
Unit Type and Rh: 7300
Unit Type and Rh: 7300
Unit Type and Rh: 7300

## 2017-11-15 LAB — TYPE AND SCREEN
ABO/RH(D): B POS
ANTIBODY SCREEN: NEGATIVE
Unit division: 0
Unit division: 0
Unit division: 0

## 2017-11-15 LAB — CBC
HCT: 29.9 % — ABNORMAL LOW (ref 36.0–46.0)
HEMOGLOBIN: 9.7 g/dL — AB (ref 12.0–15.0)
MCH: 28.8 pg (ref 26.0–34.0)
MCHC: 32.4 g/dL (ref 30.0–36.0)
MCV: 88.7 fL (ref 78.0–100.0)
Platelets: 270 10*3/uL (ref 150–400)
RBC: 3.37 MIL/uL — AB (ref 3.87–5.11)
RDW: 17.6 % — ABNORMAL HIGH (ref 11.5–15.5)
WBC: 9.6 10*3/uL (ref 4.0–10.5)

## 2017-11-15 MED ORDER — ASPIRIN 81 MG PO TBEC: 81.0000 mg | DELAYED_RELEASE_TABLET | Freq: Every day | ORAL | 12 refills | Status: DC

## 2017-11-15 MED ORDER — HYDROCORTISONE 2.5 % RE CREA: TOPICAL_CREAM | Freq: Two times a day (BID) | RECTAL | 0 refills | Status: DC

## 2017-11-15 NOTE — Progress Notes (Signed)
Physical Therapy Treatment Patient Details Name: [name] MRN: [id number] DOB: 03/05/1921 Today's Date: 11/15/2017    History of Present Illness Pt admitted from [facility] with GIB and hx of DM, Lung CA, Breast CA, bil THR, L UE lymphedema    PT Comments    Vitals monitored during session: Supine- BP 152/62, HR 103, o2 96 Sitting - BP 175/74, HR 113, O2 95 Standing - BP 171/87, HR 135, O2 95 Ambulation 5 feet - HR 153  Patient required min assist to get to EOB, mod assist +2 to stand for balance and safety. Ambluated 5 feet in room with chair following. Patient reported feeling pain in the L ankle at the arch of the foot. Positioned in chair with foot elevated for pressure relief. Informed RN of patients c/o pain.    Follow Up Recommendations  SNF     Equipment Recommendations  None recommended by PT    Recommendations for Other Services       Precautions / Restrictions Precautions Precautions: Fall Restrictions Weight Bearing Restrictions: No    Mobility  Bed Mobility Overal bed mobility: Needs Assistance Bed Mobility: Supine to Sit     Supine to sit: Min assist     General bed mobility comments: min assist to bring trunk upright, bed pad used to assist to EOB  Transfers Overall transfer level: Needs assistance Equipment used: Rolling walker (2 wheeled) Transfers: Sit to/from Stand Sit to Stand: +2 physical assistance;+2 safety/equipment;From elevated surface;Mod assist         General transfer comment: required 25% VC's for hand placement, mod assist to rise and steady, able to tolerate   Ambulation/Gait Ambulation/Gait assistance: Min assist;+2 physical assistance;+2 safety/equipment Ambulation Distance (Feet): 5 Feet Assistive device: Rolling walker (2 wheeled) Gait Pattern/deviations: Step-to pattern;Decreased step length - right;Decreased step length - left;Shuffle;Trunk flexed Gait velocity: decr Gait velocity interpretation: Below  normal speed for age/gender General Gait Details: VC's for posture and sequence with RW. Required physical assist to manage RW and for safety. Distance was limited by pain in L foot.    Stairs            Wheelchair Mobility    Modified Rankin (Stroke Patients Only)       Balance                                            Cognition Arousal/Alertness: Awake/alert Behavior During Therapy: Anxious;WFL for tasks assessed/performed Overall Cognitive Status: Within Functional Limits for tasks assessed                                        Exercises      General Comments        Pertinent Vitals/Pain Pain Assessment: 0-10 Pain Score: 6  Pain Location: L foot with WB Pain Descriptors / Indicators: Aching;Sore Pain Intervention(s): Limited activity within patient's tolerance;Repositioned;Monitored during session    Home Living                      Prior Function            PT Goals (current goals can now be found in the care plan section)      Frequency    Min 3X/week  PT Plan      Co-evaluation              AM-PAC PT "6 Clicks" Daily Activity  Outcome Measure  Difficulty turning over in bed (including adjusting bedclothes, sheets and blankets)?: A Lot Difficulty moving from lying on back to sitting on the side of the bed? : A Lot Difficulty sitting down on and standing up from a chair with arms (e.g., wheelchair, bedside commode, etc,.)?: A Lot Help needed moving to and from a bed to chair (including a wheelchair)?: A Lot Help needed walking in hospital room?: A Lot Help needed climbing 3-5 steps with a railing? : Total 6 Click Score: 11    End of Session Equipment Utilized During Treatment: Gait belt Activity Tolerance: Patient limited by fatigue;Other (comment) Patient left: in chair;with call bell/phone within reach;with chair alarm set Nurse Communication: Mobility status PT Visit  Diagnosis: Unsteadiness on feet (R26.81);Muscle weakness (generalized) (M62.81);Difficulty in walking, not elsewhere classified (R26.2);Pain Pain - Right/Left: Left Pain - part of body: Ankle and joints of foot     Time: 6349-4944 PT Time Calculation (min) (ACUTE ONLY): 32 min  Charges:  $Gait Training: 8-22 mins $Therapeutic Activity: 8-22 mins                    [name], SPTA [facility] [place]  PTA [facility]  Acute  Rehab Pager      [phone]

## 2017-11-15 NOTE — Progress Notes (Signed)
PIV x 1 removed. Discharged to SNF. [facility] transporting pt to [facility]. Report called to Hassan Rowan, Therapist, sports.

## 2017-11-15 NOTE — Discharge Summary (Signed)
Physician Discharge Summary  [name] RJJ:[id number] DOB: 09-09-1921 DOA: 11/03/2017  PCP: Blanchie Serve, MD  Admit date: 11/03/2017 Discharge date: 11/15/2017  Time spent: 35* minutes  Recommendations for Outpatient Follow-up:  1. Patient will be discharged to skilled nursing facility 2. Hold aspirin for 2 weeks, restart on 11/29/2017  3. Check with PCP if aspirin can be stopped permanently.   Discharge Diagnoses:  Principal Problem:   Hematochezia Active Problems:   Acute blood loss anemia   Diverticulosis of colon with hemorrhage   Melena   Discharge Condition: Stable  Diet recommendation: Regular diet  Filed Weights   11/09/17 0710 11/12/17 1212 11/15/17 0344  Weight: 70.3 kg (155 lb) 70.3 kg (155 lb) 72.7 kg (160 lb 4.4 oz)    History of present illness:  a 83 y.o. female with medical history significant of diverticular bleed in 2012, stopped on its own but the diverticula got clipped on colonoscopy.  Patient presents to the ED with sudden onset, persistent, worsening BRBPR episodes.  3 episodes PTA.  Painless, nothing makes better or worse.  On ASA 81 daily no other blood thinners.    Hospital Course:  1. Hematochezia-nuclear scan negative, EGD showed no source of bleeding.  Status post colonoscopy on 11/09/2017 with source of bleeding identified.  Colonoscopy showed diverticular bleed status post clipping.  Patient again re-bled after 24 hours.  Repeat colonoscopy was done on 11/12/2017, 2 of the 3 previously placed hemoclips at site of previous bleed and transverse colon was still in place.  There was evidence of recent bleed at the same site and a third clip was placed.    2. Aspirin will be held for 2 weeks as per GI recommendation.  Restart aspirin on 11/29/2017 after checking with PCP.  If no clear indication aspirin should be discontinued permanently.  Will leave this decision to PCP.  3. Anemia-hemoglobin is dropped to 7.6 today.  Status post transfusion of  1 unit PRBC.    This morning hemoglobin is 9.7.  Stable     Procedures:  Colonoscopy  Consultations:  Gastroenterology  Discharge Exam: Vitals:   11/15/17 0344 11/15/17 0800  BP: (!) 158/73   Pulse: 90   Resp: 19   Temp: 97.8 F (36.6 C) 98 F (36.7 C)  SpO2: 94%     General: Appears in no acute distress Cardiovascular: S1-S2, regular Respiratory: * clear to auscultation bilaterally  Discharge Instructions   Discharge Instructions    Diet - low sodium heart healthy   Complete by:  As directed    Increase activity slowly   Complete by:  As directed      Allergies as of 11/15/2017      Reactions   Ibuprofen Hives   Naproxen    Statins Other (See Comments)   Feels like knives in stomach   Surgilube [gyne-moistrin]    Rectal itching, burning   Tape Rash      Medication List    TAKE these medications   acetaminophen 325 MG tablet Commonly known as:  TYLENOL Take 650 mg by mouth. Take 2 tablets every 6 hours as needed   aspirin 81 MG EC tablet Take 1 tablet (81 mg total) by mouth daily. Start taking on:  11/29/2017 What changed:  These instructions start on 11/29/2017. If you are unsure what to do until then, ask your doctor or other care provider.   atenolol 25 MG tablet Commonly known as:  TENORMIN Take 0.5 tablets (12.5 mg total) by mouth  2 (two) times daily.   cetirizine 10 MG tablet Commonly known as:  ZYRTEC Take 10 mg by mouth daily.   dorzolamide-timolol 22.3-6.8 MG/ML ophthalmic solution Commonly known as:  COSOPT Place 1 drop 2 (two) times daily into both eyes. One drop in each eye twice daily   DULoxetine 20 MG capsule Commonly known as:  CYMBALTA TAKE (1) CAPSULE DAILY.   folic acid 354 MCG tablet Commonly known as:  FOLVITE Take 400 mcg by mouth daily.   hydrocortisone 2.5 % rectal cream Commonly known as:  ANUSOL-HC Place rectally 2 (two) times daily.   IRON 27 PO Take 1 tablet by mouth daily.   lactose free nutrition  Liqd Take 237 mLs by mouth. Serve in cup over ice once a day between meals   latanoprost 0.005 % ophthalmic solution Commonly known as:  XALATAN Place 1 drop into both eyes at bedtime.   omeprazole 20 MG capsule Commonly known as:  PRILOSEC TAKE 1 CAPSULE EVERY DAY.   PRESERVISION AREDS 2 PO Take by mouth. Take one capsule twice a day for vision   ROBAFEN DM 10-100 MG/5ML liquid Generic drug:  Dextromethorphan-Guaifenesin Take 5 mLs by mouth. 10 ml by mouth every 6 hours as need for cough in 48 hours   vitamin B-12 1000 MCG tablet Commonly known as:  CYANOCOBALAMIN Take 1,000 mcg by mouth daily.      Allergies  Allergen Reactions  . Ibuprofen Hives  . Naproxen   . Statins Other (See Comments)    Feels like knives in stomach  . Surgilube [Gyne-Moistrin]     Rectal itching, burning  . Tape Rash      The results of significant diagnostics from this hospitalization (including imaging, microbiology, ancillary and laboratory) are listed below for reference.    Significant Diagnostic Studies: Nm Gi Blood Loss  Result Date: 11/06/2017 CLINICAL DATA:  Dark bloody stools EXAM: NUCLEAR MEDICINE GASTROINTESTINAL BLEEDING SCAN TECHNIQUE: Sequential abdominal images were obtained following intravenous administration of Tc-78mlabeled red blood cells. RADIOPHARMACEUTICALS:  21.1 mCi Tc-939mn-vitro labeled red cells. COMPARISON:  11/03/2017 FINDINGS: Physiologic uptake in the liver and spleen. Excretory uptake in the bladder. Vascular activity in the aorta and bilateral iliac arteries. No findings to suggest active GI bleeding. IMPRESSION: No findings to suggest active GI bleeding. Electronically Signed   By: SrJulian Hy.D.   On: 11/06/2017 15:17   Nm Gi Blood Loss  Result Date: 11/04/2017 CLINICAL DATA:  [age]ear old female with bright red blood per rectum. EXAM: NUCLEAR MEDICINE GASTROINTESTINAL BLEEDING SCAN TECHNIQUE: Sequential abdominal images were obtained following  intravenous administration of Tc-9979mbeled red blood cells. RADIOPHARMACEUTICALS:  27.2 mCi Tc-31m2mvitro labeled red cells. COMPARISON:  PET-CT dated 01/30/2010 FINDINGS: There is physiologic uptake in the liver, spleen as well as physiologic activity in the aorta, IVC, and iliac vasculature. There is physiologic uptake and excretion of radiotracer by the kidneys and in the bladder. No abnormal activity identified conforming to the bowel to suggest active GI bleed. IMPRESSION: No evidence of active GI bleed. Electronically Signed   By: ArasAnner Crete.   On: 11/04/2017 02:13    Microbiology: No results found for this or any previous visit (from the past 240 hour(s)).    Recent Labs  Lab 11/12/17 0241 11/12/17 1446 11/12/17 1934 11/13/17 0719 11/14/17 0334 11/14/17 1606 11/15/17 0357  WBC 11.2* 9.1 10.8*  --  10.2  --  9.6  HGB 6.1* 8.9* 8.3* 8.6* 7.6* 9.8* 9.7*  HCT 18.3*  26.7* 24.3*  --  22.9* 29.6* 29.9*  MCV 94.3 87.5 86.8  --  88.8  --  88.7  PLT 206 212 202  --  235  --  270    CBG: Recent Labs  Lab 11/09/17 0732  GLUCAP 80       Signed:  Oswald Hillock MD.  [facility] 11/15/2017, 10:34 AM

## 2017-11-18 ENCOUNTER — Telehealth: Payer: Self-pay

## 2017-11-18 NOTE — NC FL2 (Signed)
[place] LEVEL OF CARE SCREENING TOOL     IDENTIFICATION  Patient Name: [name] Birthdate: 28-Dec-1920 Sex: female Admission Date (Current Location): 11/03/2017  [facility] and [place] Number:  Herbalist and Address:  [facility],  [place] [address] [place]      Provider Number: [id number]  Attending Physician Name and Address:  No att. providers found  Relative Name and Phone Number:       Current Level of Care: Hospital Recommended Level of Care: [place] Prior Approval Number:    Date Approved/Denied:   PASRR Number:    Discharge Plan:      Current Diagnoses: Patient Active Problem List   Diagnosis Date Noted  . Melena   . Diverticulosis of colon with hemorrhage   . Acute blood loss anemia   . Hematochezia 11/03/2017  . Decrease in appetite 04/03/2017  . Generalized weakness 04/03/2017  . Otalgia 10/25/2016  . Osteopenia 10/25/2016  . Hyponatremia 07/09/2015  . Hearing loss of both ears 04/08/2014  . HTN (hypertension) 03/27/2014  . Constipation 03/27/2014  . Skin lesion of face 03/27/2014  . Bronchiectasis ([place])   . Cough   . Rotator cuff syndrome of right shoulder 09/30/2012  . Breast cancer ([place])   . Hx of radiation therapy   . Lung cancer, lower lobe ([place]) 10/25/2011  . PSVT (paroxysmal supraventricular tachycardia) ([place]) 03/29/2011  . HIP PAIN 05/03/2010  . TOTAL HIP FOLLOW-UP 05/03/2010  . INTERDIGITAL NEUROMA 03/28/2010  . IMPINGEMENT SYNDROME 03/28/2010  . THYROID NODULE, RIGHT 09/06/2008  . HIP, ARTHRITIS, DEGEN./OSTEO 05/03/2008  . PALPITATIONS, RECURRENT 08/27/2007  . Depression with anxiety 07/31/2007  . WEAKNESS, MUSCLE 05/28/2007  . Other specified disorders of adrenal gland ([place]) 05/14/2007  . FATIGUE 05/01/2007  . Type 2 diabetes mellitus with hyperglycemia ([place]) 01/28/2007  . Hyperlipidemia 01/28/2007  . Anemia 01/28/2007  . Allergic rhinitis 01/28/2007  . GERD  01/28/2007  . OVERACTIVE BLADDER 01/28/2007  . Osteoarthritis 01/28/2007  . LOW BACK PAIN 01/28/2007  . Osteoporosis 01/28/2007    Orientation RESPIRATION BLADDER Height & Weight     Self, Time, Situation, Place  Normal Incontinent Weight: 160 lb 4.4 oz (72.7 kg) Height:  5' 3"  (160 cm)  BEHAVIORAL SYMPTOMS/MOOD NEUROLOGICAL BOWEL NUTRITION STATUS      Continent (Low Sodium Heart Healthy )  AMBULATORY STATUS COMMUNICATION OF NEEDS Skin   Extensive Assist Verbally Normal                       Personal Care Assistance Level of Assistance  Bathing, Feeding, Dressing Bathing Assistance: Limited assistance Feeding assistance: Independent Dressing Assistance: Limited assistance     Functional Limitations Info  Sight, Hearing, Speech Sight Info: Impaired Hearing Info: Impaired Speech Info: Adequate    SPECIAL CARE FACTORS FREQUENCY  PT (By licensed PT), OT (By licensed OT)     PT Frequency: 5x/week  OT Frequency: 5x/week            Contractures Contractures Info: Not present    Additional Factors Info  Code Status, Allergies Code Status Info: Prior Allergies Info: Ibuprofen, Naproxen, Statins, Surgilube Gyne-moistrin, Tape           Current Medications (11/18/2017):  This is the current hospital active medication list No current facility-administered medications for this encounter.    Current Outpatient Medications  Medication Sig Dispense Refill  . acetaminophen (TYLENOL) 325 MG tablet Take 650 mg by mouth. Take  2 tablets every 6 hours as needed    . atenolol (TENORMIN) 25 MG tablet Take 0.5 tablets (12.5 mg total) by mouth 2 (two) times daily. 60 tablet 2  . cetirizine (ZYRTEC) 10 MG tablet Take 10 mg by mouth daily.    Marland Kitchen Dextromethorphan-Guaifenesin (ROBAFEN DM) 10-100 MG/5ML liquid Take 5 mLs by mouth. 10 ml by mouth every 6 hours as need for cough in 48 hours    . dorzolamide-timolol (COSOPT) 22.3-6.8 MG/ML ophthalmic solution Place 1 drop 2 (two) times  daily into both eyes. One drop in each eye twice daily     . DULoxetine (CYMBALTA) 20 MG capsule TAKE (1) CAPSULE DAILY. 30 capsule 2  . Ferrous Gluconate (IRON 27 PO) Take 1 tablet by mouth daily.    . folic acid (FOLVITE) 876 MCG tablet Take 400 mcg by mouth daily.    Marland Kitchen lactose free nutrition (BOOST) LIQD Take 237 mLs by mouth. Serve in cup over ice once a day between meals    . Multiple Vitamins-Minerals (PRESERVISION AREDS 2 PO) Take by mouth. Take one capsule twice a day for vision    . omeprazole (PRILOSEC) 20 MG capsule TAKE 1 CAPSULE EVERY DAY. 90 capsule 1  . vitamin B-12 (CYANOCOBALAMIN) 1000 MCG tablet Take 1,000 mcg by mouth daily.    Derrill Memo ON 11/29/2017] aspirin 81 MG EC tablet Take 1 tablet (81 mg total) by mouth daily. 30 tablet 12  . hydrocortisone (ANUSOL-HC) 2.5 % rectal cream Place rectally 2 (two) times daily. 30 g 0  . latanoprost (XALATAN) 0.005 % ophthalmic solution Place 1 drop into both eyes at bedtime.       Discharge Medications: Please see discharge summary for a list of discharge medications.  Relevant Imaging Results:  Relevant Lab Results:   Additional Information ssn:  Lia Hopping, LCSW

## 2017-11-18 NOTE — Telephone Encounter (Signed)
Possible re-admission to facility. This is a patient you were seeing at [facility] . [facility] F/U is needed if patient was re-admitted to facility upon discharge. Hospital discharge from [facility] on 11/05/2017

## 2017-11-19 ENCOUNTER — Encounter: Payer: Self-pay | Admitting: Internal Medicine

## 2017-11-19 ENCOUNTER — Non-Acute Institutional Stay (SKILLED_NURSING_FACILITY): Payer: Medicare HMO | Admitting: Internal Medicine

## 2017-11-19 DIAGNOSIS — I10 Essential (primary) hypertension: Secondary | ICD-10-CM

## 2017-11-19 DIAGNOSIS — I482 Chronic atrial fibrillation: Secondary | ICD-10-CM

## 2017-11-19 DIAGNOSIS — K219 Gastro-esophageal reflux disease without esophagitis: Secondary | ICD-10-CM | POA: Diagnosis not present

## 2017-11-19 DIAGNOSIS — R531 Weakness: Secondary | ICD-10-CM

## 2017-11-19 DIAGNOSIS — J309 Allergic rhinitis, unspecified: Secondary | ICD-10-CM

## 2017-11-19 DIAGNOSIS — R2681 Unsteadiness on feet: Secondary | ICD-10-CM | POA: Diagnosis not present

## 2017-11-19 DIAGNOSIS — R7989 Other specified abnormal findings of blood chemistry: Secondary | ICD-10-CM | POA: Insufficient documentation

## 2017-11-19 DIAGNOSIS — D500 Iron deficiency anemia secondary to blood loss (chronic): Secondary | ICD-10-CM

## 2017-11-19 DIAGNOSIS — R1310 Dysphagia, unspecified: Secondary | ICD-10-CM

## 2017-11-19 DIAGNOSIS — K5731 Diverticulosis of large intestine without perforation or abscess with bleeding: Secondary | ICD-10-CM | POA: Diagnosis not present

## 2017-11-19 DIAGNOSIS — I4821 Permanent atrial fibrillation: Secondary | ICD-10-CM

## 2017-11-19 LAB — BASIC METABOLIC PANEL
BUN: 13 (ref 4–21)
Creatinine: 0.9 (ref <=1.1)
Glucose: 85
POTASSIUM: 3.9 (ref 3.4–5.3)
SODIUM: 138 (ref 137–147)

## 2017-11-19 LAB — HEPATIC FUNCTION PANEL
ALT: 12 (ref 7–35)
AST: 18 (ref 13–35)
Alkaline Phosphatase: 74 (ref 25–125)
Bilirubin, Total: 0.2

## 2017-11-19 LAB — CBC AND DIFFERENTIAL
HCT: 24 — AB (ref 36–46)
HEMOGLOBIN: 7.8 — AB (ref 12.0–16.0)
Platelets: 328 (ref 150–399)
WBC: 9.4

## 2017-11-19 NOTE — Progress Notes (Signed)
Location:  [facility] Room Number: 42-B Place of Service:  SNF (31) Provider:  Blanchie Serve MD  Blanchie Serve, MD  Patient Care Team: Blanchie Serve, MD as PCP - General (Internal Medicine) Lanelle Bal., MD as Referring Physician (Internal Medicine) Satira Sark, MD as Consulting Physician (Cardiology) Kyung Rudd, MD as Consulting Physician (Radiation Oncology)  Extended Emergency Contact Information Primary Emergency Contact: Neita Garnet of [place] Phone: [phone] Relation: Son Secondary Emergency Contact: Laforest,Lance Address: [address]          [place], [place] [address] [name] of [place] Phone: [phone] Mobile Phone: [phone] Relation: Son  Code Status:  Full code  Goals of care: Advanced Directive information Advanced Directives 11/12/2017  Does Patient Have a Medical Advance Directive? Yes  Type of Advance Directive Living will;Healthcare Power of Attorney  Does patient want to make changes to medical advance directive? No - Patient declined  Copy of [place] in Chart? No - copy requested  Would patient like information on creating a medical advance directive? No - Patient declined  Pre-existing out of facility DNR order (yellow form or pink MOST form) -     Chief Complaint  Patient presents with  . New Admit To SNF    HPI:  Pt is a 83 y.o. female seen today for admission visit. She was in the hospital from 11/03/17-11/15/17 with rectal blled. She was seen by GI. She underwent endoscopy that was unremarkable for source of bleed. NM GI scan was unremarkable. She then underwent colonoscopy on 11/09/17 showing diverticulosis with bleed and required 3 hemostatic clips. Following this, she re-bled and underwent another colonoscopy on 11/12/17 and required 1 additional hemostatic clip. She required several units of blood transfusion and her hemoglobin was 9.7 at  discharge. Currently her aspirin is on hold for 2 weeks and she is on a PPI. She is seen in her room today. She feels weak and tired. Energy level overall low. Denies any rectal bleed or black colored stool. Denies any GERD symptom. She feels strangled with food at times. She was residing in independent living prior to this hospitalization.    Past Medical History:  Diagnosis Date  . Allergic rhinitis   . Anemia    NOS  . Anxiety   . Biceps tendon rupture    Bilateral  . Breast cancer ([facility]) 2001   Left s/p lumpectomy and XRT   . Bronchiectasis ([place]) 2014   Noted on chest x-ray  . Complication of anesthesia    slow to wake up  . Cough 2014  . Depression   . Diverticulosis   . Elevated liver enzymes    Biopsy consistent with steatohepatitis  . Fundic gland polyps of stomach, benign   . GERD (gastroesophageal reflux disease) 06/2007   EGD Dr Gala Romney >sm HH, multiple fundic gland polyps  . Glaucoma 2013   both eyes  . Hemorrhoids   . Hiatal hernia   . Hx of radiation therapy 07/17/10 to 07/21/10   SRS LLL lung  . Hyperlipidemia   . Insomnia   . Low back pain    scoliosis  . Lung cancer ([facility]) 05/2010   Left 2011 - rad x 3  . Lymphedema    Left arm  . Osteoarthritis   . Osteopenia 10/25/2016  . Osteoporosis, senile   . Overactive bladder   . Pneumonia    RLL with sepsis  . PSVT (paroxysmal supraventricular tachycardia) ([facility])   .  Rheumatic fever    Age 74  . Right thyroid nodule   . Steatohepatitis    liver bx  . Type 2 diabetes mellitus ([facility] [place])    Past Surgical History:  Procedure Laterality Date  . ABDOMINAL HYSTERECTOMY  1964  . APPENDECTOMY  1964  . BIOPSY THYROID  11/2008  . BREAST BIOPSY     X 3 w/ cystectomy  . BREAST LUMPECTOMY  2001  . CATARACT EXTRACTION Bilateral 2003   Lens implant Dr. Charise Killian  . CHOLECYSTECTOMY  1965  . COLONOSCOPY  09/11/2011   Procedure: COLONOSCOPY;  Surgeon: Dorothyann Peng, MD;  [facility];  Service: Endoscopy;  Laterality:  N/A;  . COLONOSCOPY  2005 /2012   Dr. Lucianne Muss- diverticulosis, ext hemorrhoids.  . COLONOSCOPY WITH PROPOFOL N/A 11/09/2017   Procedure: COLONOSCOPY WITH PROPOFOL;  Surgeon: Mauri Pole, MD;  Location: [facility];  Service: Endoscopy;  Laterality: N/A;  . COLONOSCOPY WITH PROPOFOL N/A 11/12/2017   Procedure: COLONOSCOPY WITH PROPOFOL;  Surgeon: Ladene Artist, MD;  Location: [facility];  Service: Endoscopy;  Laterality: N/A;  . CYSTOSCOPY  2007  . ESOPHAGOGASTRODUODENOSCOPY  09/11/2011   Procedure: ESOPHAGOGASTRODUODENOSCOPY (EGD);  Surgeon: Dorothyann Peng, MD;  [facility];  Service: Endoscopy;  Laterality: N/A;  . ESOPHAGOGASTRODUODENOSCOPY (EGD) WITH PROPOFOL N/A 11/08/2017   Procedure: ESOPHAGOGASTRODUODENOSCOPY (EGD) WITH PROPOFOL;  Surgeon: Mauri Pole, MD;  Location: [facility];  Service: Endoscopy;  Laterality: N/A;  . LIVER BIOPSY  2008  . LUNG BIOPSY Left 06/06/2010  . REVISION TOTAL HIP ARTHROPLASTY  2007   Left  . SKIN CANCER EXCISION     nose and left lower cheek  . TONSILLECTOMY  1930  . TOTAL HIP ARTHROPLASTY Bilateral 2005 & 2007    Dr. Earl Lagos. Aline Brochure     Allergies  Allergen Reactions  . Augmentin [Amoxicillin-Pot Clavulanate]   . Ibuprofen Hives  . Naproxen   . Statins Other (See Comments)    Feels like knives in stomach  . Surgilube [Gyne-Moistrin]     Rectal itching, burning  . Tape Rash    Outpatient Encounter Medications as of 11/19/2017  Medication Sig  . acetaminophen (TYLENOL) 325 MG tablet Take 650 mg by mouth. Take 2 tablets every 6 hours as needed  . atenolol (TENORMIN) 25 MG tablet Take 0.5 tablets (12.5 mg total) by mouth 2 (two) times daily.  . cetirizine (ZYRTEC) 10 MG tablet Take 10 mg by mouth daily.  Marland Kitchen Dextromethorphan-Guaifenesin (ROBAFEN DM) 10-100 MG/5ML liquid Take 5 mLs by mouth. 10 ml by mouth every 6 hours as need for cough in 48 hours  . dorzolamide-timolol (COSOPT) 22.3-6.8 MG/ML ophthalmic  solution Place 1 drop 2 (two) times daily into both eyes. One drop in each eye twice daily   . DULoxetine (CYMBALTA) 20 MG capsule TAKE (1) CAPSULE DAILY.  Marland Kitchen Ferrous Gluconate (IRON 27 PO) Take 1 tablet by mouth daily.  . folic acid (FOLVITE) 935 MCG tablet Take 400 mcg by mouth daily.  . hydrocortisone (ANUSOL-HC) 2.5 % rectal cream Place rectally 2 (two) times daily.  Marland Kitchen latanoprost (XALATAN) 0.005 % ophthalmic solution Place 1 drop into both eyes at bedtime.  . Multiple Vitamins-Minerals (PRESERVISION AREDS 2 PO) Take by mouth. Take one capsule twice a day for vision  . omeprazole (PRILOSEC) 20 MG capsule TAKE 1 CAPSULE EVERY DAY.  . vitamin B-12 (CYANOCOBALAMIN) 1000 MCG tablet Take 1,000 mcg by mouth daily.  Derrill Memo ON 11/29/2017] aspirin 81 MG EC tablet  Take 1 tablet (81 mg total) by mouth daily. (Patient not taking: Reported on 11/19/2017)  . [DISCONTINUED] lactose free nutrition (BOOST) LIQD Take 237 mLs by mouth. Serve in cup over ice once a day between meals   No facility-administered encounter medications on file as of 11/19/2017.     Review of Systems  Constitutional: Positive for fatigue. Negative for appetite change, chills and fever.  HENT: Positive for hearing loss and postnasal drip. Negative for congestion, ear pain, mouth sores, sinus pressure, sore throat and trouble swallowing.   Eyes: Positive for visual disturbance. Negative for discharge and itching.  Respiratory: Positive for choking. Negative for cough, shortness of breath and wheezing.   Cardiovascular: Positive for leg swelling. Negative for chest pain and palpitations.  Gastrointestinal: Negative for abdominal pain, anal bleeding, blood in stool, constipation, diarrhea, nausea and vomiting.  Genitourinary: Positive for frequency. Negative for dysuria.  Musculoskeletal: Positive for gait problem. Negative for arthralgias and back pain.  Skin: Negative for rash.  Neurological: Negative for dizziness, tremors,  syncope, light-headedness and headaches.  Psychiatric/Behavioral: Negative for behavioral problems and confusion.    Immunization History  Administered Date(s) Administered  . Influenza Whole 09/22/2008, 09/16/2012, 09/30/2013  . Influenza, High Dose Seasonal PF 09/25/2017  . Influenza-Unspecified 10/02/2014, 09/15/2015, 09/27/2016  . Pneumococcal Polysaccharide-23 08/17/2006  . Td 12/17/2005   Pertinent  Health Maintenance Due  Topic Date Due  . FOOT EXAM  07/19/1931  . OPHTHALMOLOGY EXAM  07/19/1931  . PNA vac Low Risk Adult (2 of 2 - PCV13) 08/18/2007  . URINE MICROALBUMIN  04/30/2008  . HEMOGLOBIN A1C  10/17/2017  . INFLUENZA VACCINE  Completed  . DEXA SCAN  Completed   Fall Risk  09/19/2017 05/17/2016 04/20/2016 04/19/2016  Falls in the past year? No No No No   Functional Status Survey:    Vitals:   11/19/17 1327  BP: 120/60  Pulse: 64  Resp: 18  Temp: 99.8 F (37.7 C)  Weight: 157 lb 11.2 oz (71.5 kg)  Height: 5' 5"  (1.651 m)   Body mass index is 26.24 kg/m.   Wt Readings from Last 3 Encounters:  11/19/17 157 lb 11.2 oz (71.5 kg)  11/15/17 160 lb 4.4 oz (72.7 kg)  10/10/17 155 lb 6.4 oz (70.5 kg)   Physical Exam  Constitutional: She is oriented to person, place, and time. She appears well-developed and well-nourished. No distress.  HENT:  Head: Normocephalic and atraumatic.  Mouth/Throat: Oropharynx is clear and moist. No oropharyngeal exudate.  Eyes: Conjunctivae and EOM are normal. Pupils are equal, round, and reactive to light.  Corrective glasses  Neck: Neck supple.  Cardiovascular:  Irregular heart rate  Pulmonary/Chest: Effort normal and breath sounds normal. No respiratory distress. She has no wheezes. She has no rales.  Abdominal: Soft. Bowel sounds are normal. There is no tenderness. There is no guarding.  Musculoskeletal: She exhibits edema and deformity.  Able to move all 4 extremities. Weakness to lower legs (chronic). Wheelchair for ambulation.  Unsteady gait. Trace leg edema. Limited ROM with shoulders   Lymphadenopathy:    She has no cervical adenopathy.  Neurological: She is alert and oriented to person, place, and time.  Skin: Skin is warm and dry. No rash noted. She is not diaphoretic.  Psychiatric: She has a normal mood and affect.    Labs reviewed: Recent Labs    10/09/17 0700  11/03/17 1818 11/03/17 1829 11/03/17 2039 11/05/17 0346 11/19/17  NA 134*  --  136 138 137 135 138  K 3.8  --  3.7 3.6 3.7 3.9 3.9  CL 98  --  103 101 103 109  --   CO2 28  --  25  --   --  22  --   GLUCOSE 97  --  103* 102* 146* 119*  --   BUN 20  --  23* 23* 26* 34* 13  CREATININE 0.82   < > 0.89 0.90 0.90 0.84 0.9  CALCIUM 9.8  --  9.2  --   --  8.2*  --    < > = values in this interval not displayed.   Recent Labs    07/24/17 0931 08/21/17 0000 11/03/17 1818 11/19/17  AST 17  17 16 20 18   ALT 11  11 14  13* 12  ALKPHOS 90  90  --  78 74  BILITOT 0.4  0.4 0.3 0.4  --   PROT 7.0  7.0 7.0 6.5  --   ALBUMIN 3.6  3.6  --  3.1*  --    Recent Labs    11/12/17 1934  11/14/17 0334 11/14/17 1606 11/15/17 0357 11/19/17  WBC 10.8*  --  10.2  --  9.6 9.4  HGB 8.3*   < > 7.6* 9.8* 9.7* 7.8*  HCT 24.3*  --  22.9* 29.6* 29.9* 24*  MCV 86.8  --  88.8  --  88.7  --   PLT 202  --  235  --  270 328   < > = values in this interval not displayed.   Lab Results  Component Value Date   TSH 33.00 (A) 04/16/2017   Lab Results  Component Value Date   HGBA1C 5.3 04/16/2017   Lab Results  Component Value Date   CHOL 188 04/12/2016   HDL 62 04/12/2016   LDLCALC 102 04/12/2016   TRIG 122 04/12/2016   CHOLHDL 3.0 Ratio 06/07/2008    Significant Diagnostic Results in last 30 days:  Nm Gi Blood Loss  Result Date: 11/06/2017 CLINICAL DATA:  Dark bloody stools EXAM: NUCLEAR MEDICINE GASTROINTESTINAL BLEEDING SCAN TECHNIQUE: Sequential abdominal images were obtained following intravenous administration of Tc-24mlabeled red blood  cells. RADIOPHARMACEUTICALS:  21.1 mCi Tc-978mn-vitro labeled red cells. COMPARISON:  11/03/2017 FINDINGS: Physiologic uptake in the liver and spleen. Excretory uptake in the bladder. Vascular activity in the aorta and bilateral iliac arteries. No findings to suggest active GI bleeding. IMPRESSION: No findings to suggest active GI bleeding. Electronically Signed   By: SrJulian Hy.D.   On: 11/06/2017 15:17   Nm Gi Blood Loss  Result Date: 11/04/2017 CLINICAL DATA:  [age]ear old female with bright red blood per rectum. EXAM: NUCLEAR MEDICINE GASTROINTESTINAL BLEEDING SCAN TECHNIQUE: Sequential abdominal images were obtained following intravenous administration of Tc-9964mbeled red blood cells. RADIOPHARMACEUTICALS:  27.2 mCi Tc-49m68mvitro labeled red cells. COMPARISON:  PET-CT dated 01/30/2010 FINDINGS: There is physiologic uptake in the liver, spleen as well as physiologic activity in the aorta, IVC, and iliac vasculature. There is physiologic uptake and excretion of radiotracer by the kidneys and in the bladder. No abnormal activity identified conforming to the bowel to suggest active GI bleed. IMPRESSION: No evidence of active GI bleed. Electronically Signed   By: ArasAnner Crete.   On: 11/04/2017 02:13    Assessment/Plan  1. Unsteady gait With generalized weakness and her deconditioning. Will have patient work with PT/OT as tolerated to regain strength and restore function.  Fall precautions are in place.  2. Generalized weakness Will have  her work with physical therapy and occupational therapy team to help with gait training and muscle strengthening exercises.fall precautions. Skin care. Encourage to be out of bed.   3. Blood loss anemia With diverticular bleed. S/p several units of PRBC transfusion. Hemoglobin 9.7 at discharge and 7.8 today. No active bleed or melena reported. FOBT X 3 today. Cbc in am. Currently hemodynamically stable. She is at risk for another diverticular  bleed. Will need to review goals of care with pt and her HCPOA.   4. Diverticulosis of colon with hemorrhage Required several hemostasis clipping. Low Hb on lab review but stable BUN/ cr and vital signs. Monitor clinically. Aspirin on hold for now.   5. Gastroesophageal reflux disease without esophagitis Currently on omeprazole 20 mg daily, continue this. EGD normal.   6. TSH elevation TSH 33 in 5/18. Pt has cold intolerance. It could be from anemia but given her history of thyroid adenoma and abnormal TSH, recheck TSH.   7. Permanent atrial fibrillation (HCC) Irregular HR this visit. Reviewed EKG from hospital with afib. Rate currently controlled. She is on atenolol. Hold off on any anticoagulation or antiplatelet agent with her low Hb and concern for bleed. Will need to review goals of care with pt and family to address her risk for CVA and need for anticoagulation.   8. Allergic rhinitis, unspecified seasonality, unspecified trigger Continue claritin  9. Essential hypertension Continue atenolol 12. 5 mg bid for now. Monitor BP  10 dysphagia SLP consult.   Family/ staff Communication: reviewed care plan with patient and charge nurse.    Labs/tests ordered:  Cbc, bmp, tsh, free T4, PT, OT, SLP, FOBT X 3   Blanchie Serve, MD Internal Medicine [facility] [address] [place], [place] [address] Cell Phone (Monday-Friday 8 am - 5 pm): [phone] On Call: [phone] and follow prompts after 5 pm and on weekends Office Phone: [phone] Office Fax: [phone]

## 2017-11-20 ENCOUNTER — Non-Acute Institutional Stay (SKILLED_NURSING_FACILITY): Payer: Medicare HMO | Admitting: Nurse Practitioner

## 2017-11-20 ENCOUNTER — Encounter: Payer: Self-pay | Admitting: Nurse Practitioner

## 2017-11-20 DIAGNOSIS — Z7189 Other specified counseling: Secondary | ICD-10-CM | POA: Diagnosis not present

## 2017-11-20 DIAGNOSIS — D62 Acute posthemorrhagic anemia: Secondary | ICD-10-CM | POA: Diagnosis not present

## 2017-11-20 LAB — BASIC METABOLIC PANEL
BUN: 13 (ref 4–21)
Creatinine: 0.7 (ref <=1.1)
GLUCOSE: 82
POTASSIUM: 3.7 (ref 3.4–5.3)
Sodium: 139 (ref 137–147)

## 2017-11-20 LAB — CBC AND DIFFERENTIAL
HEMATOCRIT: 23 — AB (ref 36–46)
HEMOGLOBIN: 7.3 — AB (ref 12.0–16.0)
Platelets: 293 (ref 150–399)
WBC: 7.9

## 2017-11-20 LAB — TSH: TSH: 2 (ref <=5.90)

## 2017-11-20 NOTE — Progress Notes (Signed)
Location:   [facility] Room Number: 42-B Place of Service:  SNF (31) Provider: Lennie Odor Bettyjean Stefanski NP  Blanchie Serve, MD  Patient Care Team: Blanchie Serve, MD as PCP - General (Internal Medicine) Lanelle Bal., MD as Referring Physician (Internal Medicine) Satira Sark, MD as Consulting Physician (Cardiology) Kyung Rudd, MD as Consulting Physician (Radiation Oncology)  Extended Emergency Contact Information Primary Emergency Contact: Neita Garnet of [place] Phone: [phone] Relation: Son Secondary Emergency Contact: Goody,Lance Address: [address]          [place], [place] [address] [name] of [place] Phone: [phone] Mobile Phone: [phone] Relation: Son  Code Status: DNR Goals of care: Advanced Directive information Advanced Directives 11/22/2017  Does Patient Have a Medical Advance Directive? Yes  Type of Advance Directive Living will  Does patient want to make changes to medical advance directive? No - Patient declined  Copy of [place] in Chart? -  Would patient like information on creating a medical advance directive? -  Pre-existing out of facility DNR order (yellow form or pink MOST form) -     Chief Complaint  Patient presents with  . Acute Visit    ACP Meeting    HPI:  Pt is a 83 y.o. female seen today for an acute visit for discussing plan/goal of care. She has HPOA and living wills. DNR and MOST to be discussed with filled out.   She has been recent hospitalized for diverticular bleed, had several units of blood transfusion/hemastasis clips, Hgb dropped 7.8 11/18/17, no noted blood in stool today, denied abd pain, nausea, vomiting, diarrhea, or constipation. She admitted being tired, but no dizziness, headache, palpitation, or SOB associated with the event. CBC pending, she is on Fe, Vit B12, folate, Omeprazole.    Past Medical History:  Diagnosis Date  . Allergic rhinitis   . Anemia      NOS  . Anxiety   . Biceps tendon rupture    Bilateral  . Breast cancer ([facility]) 2001   Left s/p lumpectomy and XRT   . Bronchiectasis ([facility]) 2014   Noted on chest x-ray  . Complication of anesthesia    slow to wake up  . Cough 2014  . Depression   . Diverticulosis   . Elevated liver enzymes    Biopsy consistent with steatohepatitis  . Fundic gland polyps of stomach, benign   . GERD (gastroesophageal reflux disease) 06/2007   EGD Dr Gala Romney >sm HH, multiple fundic gland polyps  . Glaucoma 2013   both eyes  . Hemorrhoids   . Hiatal hernia   . Hx of radiation therapy 07/17/10 to 07/21/10   SRS LLL lung  . Hyperlipidemia   . Insomnia   . Low back pain    scoliosis  . Lung cancer ([facility]) 05/2010   Left 2011 - rad x 3  . Lymphedema    Left arm  . Osteoarthritis   . Osteopenia 10/25/2016  . Osteoporosis, senile   . Overactive bladder   . Pneumonia    RLL with sepsis  . PSVT (paroxysmal supraventricular tachycardia) ([facility])   . Rheumatic fever    Age 47  . Right thyroid nodule   . Steatohepatitis    liver bx  . Type 2 diabetes mellitus ([facility])    Past Surgical History:  Procedure Laterality Date  . ABDOMINAL HYSTERECTOMY  1964  . APPENDECTOMY  1964  . BIOPSY THYROID  11/2008  . BREAST BIOPSY  X 3 w/ cystectomy  . BREAST LUMPECTOMY  2001  . CATARACT EXTRACTION Bilateral 2003   Lens implant Dr. Charise Killian  . CHOLECYSTECTOMY  1965  . COLONOSCOPY  09/11/2011   Procedure: COLONOSCOPY;  Surgeon: Dorothyann Peng, MD;  Location: [facility];  Service: Endoscopy;  Laterality: N/A;  . COLONOSCOPY  2005 /2012   Dr. Lucianne Muss- diverticulosis, ext hemorrhoids.  . COLONOSCOPY WITH PROPOFOL N/A 11/09/2017   Procedure: COLONOSCOPY WITH PROPOFOL;  Surgeon: Mauri Pole, MD;  Location: [facility];  Service: Endoscopy;  Laterality: N/A;  . COLONOSCOPY WITH PROPOFOL N/A 11/12/2017   Procedure: COLONOSCOPY WITH PROPOFOL;  Surgeon: Ladene Artist, MD;  Location: [facility];  Service:  Endoscopy;  Laterality: N/A;  . CYSTOSCOPY  2007  . ESOPHAGOGASTRODUODENOSCOPY  09/11/2011   Procedure: ESOPHAGOGASTRODUODENOSCOPY (EGD);  Surgeon: Dorothyann Peng, MD;  [facility];  Service: Endoscopy;  Laterality: N/A;  . ESOPHAGOGASTRODUODENOSCOPY (EGD) WITH PROPOFOL N/A 11/08/2017   Procedure: ESOPHAGOGASTRODUODENOSCOPY (EGD) WITH PROPOFOL;  Surgeon: Mauri Pole, MD;  Location: [facility];  Service: Endoscopy;  Laterality: N/A;  . LIVER BIOPSY  2008  . LUNG BIOPSY Left 06/06/2010  . REVISION TOTAL HIP ARTHROPLASTY  2007   Left  . SKIN CANCER EXCISION     nose and left lower cheek  . TONSILLECTOMY  1930  . TOTAL HIP ARTHROPLASTY Bilateral 2005 & 2007    Dr. Earl Lagos. Aline Brochure     Allergies  Allergen Reactions  . Augmentin [Amoxicillin-Pot Clavulanate]   . Ibuprofen Hives  . Naproxen   . Statins Other (See Comments)    Feels like knives in stomach  . Surgilube [Gyne-Moistrin]     Rectal itching, burning  . Tape Rash    Allergies as of 11/20/2017      Reactions   Augmentin [amoxicillin-pot Clavulanate]    Ibuprofen Hives   Naproxen    Statins Other (See Comments)   Feels like knives in stomach   Surgilube [gyne-moistrin]    Rectal itching, burning   Tape Rash      Medication List        Accurate as of 11/20/17 11:59 PM. Always use your most recent med list.          acetaminophen 325 MG tablet Commonly known as:  TYLENOL Take 650 mg by mouth every 6 (six) hours as needed.   aspirin 81 MG EC tablet Take 1 tablet (81 mg total) by mouth daily. Start taking on:  11/29/2017   atenolol 25 MG tablet Commonly known as:  TENORMIN Take 0.5 tablets (12.5 mg total) by mouth 2 (two) times daily.   cetirizine 10 MG tablet Commonly known as:  ZYRTEC Take 10 mg by mouth daily.   dorzolamide-timolol 22.3-6.8 MG/ML ophthalmic solution Commonly known as:  COSOPT Place 1 drop into both eyes 2 (two) times daily.   DULoxetine 20 MG  capsule Commonly known as:  CYMBALTA TAKE (1) CAPSULE DAILY.   folic acid 563 MCG tablet Commonly known as:  FOLVITE Take 400 mcg by mouth daily.   hydrocortisone 2.5 % rectal cream Commonly known as:  ANUSOL-HC Place rectally 2 (two) times daily.   IRON 27 PO Take 1 tablet by mouth daily.   latanoprost 0.005 % ophthalmic solution Commonly known as:  XALATAN Place 1 drop into both eyes at bedtime.   omeprazole 20 MG capsule Commonly known as:  PRILOSEC TAKE 1 CAPSULE EVERY DAY.   PRESERVISION AREDS 2 PO Take 1 capsule by mouth 2 (  two) times daily.   vitamin B-12 1000 MCG tablet Commonly known as:  CYANOCOBALAMIN Take 1,000 mcg by mouth daily.       Review of Systems  Constitutional: Positive for fatigue. Negative for activity change, appetite change, chills, diaphoresis and fever.  HENT: Positive for hearing loss. Negative for congestion, trouble swallowing and voice change.        R+L hearing aids.   Eyes: Negative for visual disturbance.       Corrective glasses.   Respiratory: Negative for cough, choking, chest tightness, shortness of breath and wheezing.   Cardiovascular: Positive for leg swelling. Negative for chest pain and palpitations.       Trace edema in ankles.   Gastrointestinal: Negative for abdominal distention, abdominal pain, anal bleeding, blood in stool, constipation, diarrhea, nausea and vomiting.  Endocrine: Negative for cold intolerance.  Genitourinary: Positive for frequency. Negative for difficulty urinating, dysuria and urgency.       Occasional urinary leakage.   Musculoskeletal: Positive for arthralgias and gait problem.  Skin: Positive for pallor. Negative for color change, rash and wound.  Neurological: Negative for tremors, speech difficulty, weakness and headaches.  Psychiatric/Behavioral: Negative for agitation, behavioral problems, confusion, hallucinations and sleep disturbance. The patient is not nervous/anxious.     Immunization  History  Administered Date(s) Administered  . Influenza Whole 09/22/2008, 09/16/2012, 09/30/2013  . Influenza, High Dose Seasonal PF 09/25/2017  . Influenza-Unspecified 10/02/2014, 09/15/2015, 09/27/2016  . Pneumococcal Polysaccharide-23 08/17/2006  . Td 12/17/2005   Pertinent  Health Maintenance Due  Topic Date Due  . FOOT EXAM  07/19/1931  . OPHTHALMOLOGY EXAM  07/19/1931  . PNA vac Low Risk Adult (2 of 2 - PCV13) 08/18/2007  . URINE MICROALBUMIN  04/30/2008  . HEMOGLOBIN A1C  10/17/2017  . INFLUENZA VACCINE  Completed  . DEXA SCAN  Completed   Fall Risk  09/19/2017 05/17/2016 04/20/2016 04/19/2016  Falls in the past year? No No No No   Functional Status Survey:    Vitals:   11/20/17 1416  BP: 115/60  Pulse: 76  Resp: 18  Temp: 99.1 F (37.3 C)  SpO2: 96%  Weight: 157 lb 11.2 oz (71.5 kg)  Height: 5' 5"  (1.651 m)   Body mass index is 26.24 kg/m. Physical Exam  Constitutional: She is oriented to person, place, and time. She appears well-developed and well-nourished. No distress.  HENT:  Head: Normocephalic and atraumatic.  Eyes: Conjunctivae and EOM are normal. Pupils are equal, round, and reactive to light.  Neck: Normal range of motion. Neck supple. No JVD present. No thyromegaly present.  Cardiovascular: Normal rate, regular rhythm and normal heart sounds.  No murmur heard. Pulmonary/Chest: Effort normal and breath sounds normal. She has no wheezes. She has no rales.  Abdominal: Soft. Bowel sounds are normal. She exhibits no distension. There is no tenderness. There is no rebound and no guarding.  Musculoskeletal: She exhibits edema. She exhibits no tenderness.  Trace edema in ankles, decreased ROM of the right shoulder with over head motions. One person transfer, ambulates with walker,  wheelchair to go further.   Neurological: She is alert and oriented to person, place, and time. She exhibits abnormal muscle tone. Coordination normal.  Skin: Skin is warm and dry. No  rash noted. She is not diaphoretic. No erythema. There is pallor.  Psychiatric: She has a normal mood and affect. Her behavior is normal. Judgment and thought content normal.    Labs reviewed: Recent Labs    10/09/17 0700  11/03/17 1818 11/03/17 1829 11/03/17 2039 11/05/17 0346 11/19/17 11/20/17  NA 134*  --  136 138 137 135 138 139  K 3.8  --  3.7 3.6 3.7 3.9 3.9 3.7  CL 98  --  103 101 103 109  --   --   CO2 28  --  25  --   --  22  --   --   GLUCOSE 97  --  103* 102* 146* 119*  --   --   BUN 20  --  23* 23* 26* 34* 13 13  CREATININE 0.82   < > 0.89 0.90 0.90 0.84 0.9 0.7  CALCIUM 9.8  --  9.2  --   --  8.2*  --   --    < > = values in this interval not displayed.   Recent Labs    07/24/17 0931 08/21/17 0000 11/03/17 1818 11/19/17  AST 17  17 16 20 18   ALT 11  11 14  13* 12  ALKPHOS 90  90  --  78 74  BILITOT 0.4  0.4 0.3 0.4  --   PROT 7.0  7.0 7.0 6.5  --   ALBUMIN 3.6  3.6  --  3.1*  --    Recent Labs    11/12/17 1934  11/14/17 0334  11/15/17 0357 11/19/17 11/20/17  WBC 10.8*  --  10.2  --  9.6 9.4 7.9  HGB 8.3*   < > 7.6*   < > 9.7* 7.8* 7.3*  HCT 24.3*  --  22.9*   < > 29.9* 24* 23*  MCV 86.8  --  88.8  --  88.7  --   --   PLT 202  --  235  --  270 328 293   < > = values in this interval not displayed.   Lab Results  Component Value Date   TSH 2.00 11/20/2017   Lab Results  Component Value Date   HGBA1C 5.3 04/16/2017   Lab Results  Component Value Date   CHOL 188 04/12/2016   HDL 62 04/12/2016   LDLCALC 102 04/12/2016   TRIG 122 04/12/2016   CHOLHDL 3.0 Ratio 06/07/2008    Significant Diagnostic Results in last 30 days:  Nm Gi Blood Loss  Result Date: 11/06/2017 CLINICAL DATA:  Dark bloody stools EXAM: NUCLEAR MEDICINE GASTROINTESTINAL BLEEDING SCAN TECHNIQUE: Sequential abdominal images were obtained following intravenous administration of Tc-66mlabeled red blood cells. RADIOPHARMACEUTICALS:  21.1 mCi Tc-96mn-vitro labeled red cells.  COMPARISON:  11/03/2017 FINDINGS: Physiologic uptake in the liver and spleen. Excretory uptake in the bladder. Vascular activity in the aorta and bilateral iliac arteries. No findings to suggest active GI bleeding. IMPRESSION: No findings to suggest active GI bleeding. Electronically Signed   By: SrJulian Hy.D.   On: 11/06/2017 15:17   Nm Gi Blood Loss  Result Date: 11/04/2017 CLINICAL DATA:  [age]ear old female with bright red blood per rectum. EXAM: NUCLEAR MEDICINE GASTROINTESTINAL BLEEDING SCAN TECHNIQUE: Sequential abdominal images were obtained following intravenous administration of Tc-9935mbeled red blood cells. RADIOPHARMACEUTICALS:  27.2 mCi Tc-96m63mvitro labeled red cells. COMPARISON:  PET-CT dated 01/30/2010 FINDINGS: There is physiologic uptake in the liver, spleen as well as physiologic activity in the aorta, IVC, and iliac vasculature. There is physiologic uptake and excretion of radiotracer by the kidneys and in the bladder. No abnormal activity identified conforming to the bowel to suggest active GI bleed. IMPRESSION: No evidence of active GI bleed. Electronically Signed  By: Anner Crete M.D.   On: 11/04/2017 02:13    Assessment/Plan: Advanced care planning/counseling discussion Went over care plan with the patient, patient's son, HPOA,  Airen Dales.  DON RN Earley Favor presented during the conversation, care planning from 2:10pm to 3:00 pm, have gone over MOST form and filled it out. Patient decided to be Do Not Resuscitate, DNR form was filled out and signed. Will transfer to hospital if indicated with limited additional interventions, avoid intensive care. Antibiotics if indicated/determine use or limitation of antibiotics for infection. IV fluids if indicted for defined period. No feeding tube insertion. The patient and her HPOA have signed the forms along with myself and DON, RN Inis Sizer Ronnald Ramp. I spend 50 minutes doing advance care plan meeting with the patient  and her HPOA son Vincentina Sollers, questions answered.   Acute blood loss anemia She has been recent hospitalized for diverticular bleed, had several units of blood transfusion/hemastasis clips, Hgb dropped 7.8 11/18/17, no noted blood in stool today, denied abd pain, nausea, vomiting, diarrhea, or constipation. She admitted being tired, but no dizziness, headache, palpitation, or SOB associated with the event. CBC pending, she is on Fe, Vit B12, folate, Omeprazole. May consider ED eval if Hgb <7     Family/ staff Communication: plan of care reviewed with the patient, patient's son, DON RN Earley Favor.   Labs/tests ordered: CBC pending, FOBT x3 pending.   Time spend 25 minutes with the patient, 12mnutes spent discussing advanced care planning.

## 2017-11-20 NOTE — Assessment & Plan Note (Addendum)
She has been recent hospitalized for diverticular bleed, had several units of blood transfusion/hemastasis clips, Hgb dropped 7.8 11/18/17, no noted blood in stool today, denied abd pain, nausea, vomiting, diarrhea, or constipation. She admitted being tired, but no dizziness, headache, palpitation, or SOB associated with the event. CBC pending, she is on Fe, Vit B12, folate, Omeprazole. May consider ED eval if Hgb <7

## 2017-11-20 NOTE — Progress Notes (Signed)
Location:  [place] Room Number: 42-B Place of Service:  SNF (31) Provider:  Mast, Manxie  NP  Blanchie Serve, MD  Patient Care Team: Blanchie Serve, MD as PCP - General (Internal Medicine) Lanelle Bal., MD as Referring Physician (Internal Medicine) Satira Sark, MD as Consulting Physician (Cardiology) Kyung Rudd, MD as Consulting Physician (Radiation Oncology)  Extended Emergency Contact Information Primary Emergency Contact: Neita Garnet of [place] Phone: [phone] Relation: Son Secondary Emergency Contact: Denunzio,Lance Address: [address]          [place], [place] [address] [name] of [place] Phone: [phone] Mobile Phone: [phone] Relation: Son  Code Status:  Full Code Goals of care: Advanced Directive information Advanced Directives 11/20/2017  Does Patient Have a Medical Advance Directive? Yes  Type of Paramedic of [place];Living will  Does patient want to make changes to medical advance directive? No - Patient declined  Copy of [place] in Chart? -  Would patient like information on creating a medical advance directive? -  Pre-existing out of facility DNR order (yellow form or pink MOST form) -     Chief Complaint  Patient presents with  . Acute Visit    ACP Meeting    HPI:  Pt is a 83 y.o. female seen today for an acute visit for    Past Medical History:  Diagnosis Date  . Allergic rhinitis   . Anemia    NOS  . Anxiety   . Biceps tendon rupture    Bilateral  . Breast cancer ([facility]) 2001   Left s/p lumpectomy and XRT   . Bronchiectasis ([facility]) 2014   Noted on chest x-ray  . Complication of anesthesia    slow to wake up  . Cough 2014  . Depression   . Diverticulosis   . Elevated liver enzymes    Biopsy consistent with steatohepatitis  . Fundic gland polyps of stomach, benign   . GERD (gastroesophageal reflux disease) 06/2007   EGD Dr Gala Romney >sm HH, multiple fundic gland polyps  . Glaucoma 2013   both eyes  . Hemorrhoids   . Hiatal hernia   . Hx of radiation therapy 07/17/10 to 07/21/10   SRS LLL lung  . Hyperlipidemia   . Insomnia   . Low back pain    scoliosis  . Lung cancer ([facility]) 05/2010   Left 2011 - rad x 3  . Lymphedema    Left arm  . Osteoarthritis   . Osteopenia 10/25/2016  . Osteoporosis, senile   . Overactive bladder   . Pneumonia    RLL with sepsis  . PSVT (paroxysmal supraventricular tachycardia) ([facility])   . Rheumatic fever    Age 9  . Right thyroid nodule   . Steatohepatitis    liver bx  . Type 2 diabetes mellitus ([facility])    Past Surgical History:  Procedure Laterality Date  . ABDOMINAL HYSTERECTOMY  1964  . APPENDECTOMY  1964  . BIOPSY THYROID  11/2008  . BREAST BIOPSY     X 3 w/ cystectomy  . BREAST LUMPECTOMY  2001  . CATARACT EXTRACTION Bilateral 2003   Lens implant Dr. Charise Killian  . CHOLECYSTECTOMY  1965  . COLONOSCOPY  09/11/2011   Procedure: COLONOSCOPY;  Surgeon: Dorothyann Peng, MD;  [facility];  Service: Endoscopy;  Laterality: N/A;  . COLONOSCOPY  2005 /2012   Dr. Lucianne Muss- diverticulosis, ext hemorrhoids.  . COLONOSCOPY WITH PROPOFOL  N/A 11/09/2017   Procedure: COLONOSCOPY WITH PROPOFOL;  Surgeon: Mauri Pole, MD;  [facility];  Service: Endoscopy;  Laterality: N/A;  . COLONOSCOPY WITH PROPOFOL N/A 11/12/2017   Procedure: COLONOSCOPY WITH PROPOFOL;  Surgeon: Ladene Artist, MD;  [facility];  Service: Endoscopy;  Laterality: N/A;  . CYSTOSCOPY  2007  . ESOPHAGOGASTRODUODENOSCOPY  09/11/2011   Procedure: ESOPHAGOGASTRODUODENOSCOPY (EGD);  Surgeon: Dorothyann Peng, MD;  [facility];  Service: Endoscopy;  Laterality: N/A;  . ESOPHAGOGASTRODUODENOSCOPY (EGD) WITH PROPOFOL N/A 11/08/2017   Procedure: ESOPHAGOGASTRODUODENOSCOPY (EGD) WITH PROPOFOL;  Surgeon: Mauri Pole, MD;  [facility];  Service: Endoscopy;   Laterality: N/A;  . LIVER BIOPSY  2008  . LUNG BIOPSY Left 06/06/2010  . REVISION TOTAL HIP ARTHROPLASTY  2007   Left  . SKIN CANCER EXCISION     nose and left lower cheek  . TONSILLECTOMY  1930  . TOTAL HIP ARTHROPLASTY Bilateral 2005 & 2007    Dr. Earl Lagos. Aline Brochure     Allergies  Allergen Reactions  . Augmentin [Amoxicillin-Pot Clavulanate]   . Ibuprofen Hives  . Naproxen   . Statins Other (See Comments)    Feels like knives in stomach  . Surgilube [Gyne-Moistrin]     Rectal itching, burning  . Tape Rash    Outpatient Encounter Medications as of 11/20/2017  Medication Sig  . acetaminophen (TYLENOL) 325 MG tablet Take 650 mg by mouth. Take 2 tablets every 6 hours as needed  . [START ON 11/29/2017] aspirin 81 MG EC tablet Take 1 tablet (81 mg total) by mouth daily.  Marland Kitchen atenolol (TENORMIN) 25 MG tablet Take 0.5 tablets (12.5 mg total) by mouth 2 (two) times daily.  . cetirizine (ZYRTEC) 10 MG tablet Take 10 mg by mouth daily.  . dorzolamide-timolol (COSOPT) 22.3-6.8 MG/ML ophthalmic solution Place 1 drop 2 (two) times daily into both eyes. One drop in each eye twice daily   . DULoxetine (CYMBALTA) 20 MG capsule TAKE (1) CAPSULE DAILY.  Marland Kitchen Ferrous Gluconate (IRON 27 PO) Take 1 tablet by mouth daily.  . folic acid (FOLVITE) 759 MCG tablet Take 400 mcg by mouth daily.  . hydrocortisone (ANUSOL-HC) 2.5 % rectal cream Place rectally 2 (two) times daily.  Marland Kitchen latanoprost (XALATAN) 0.005 % ophthalmic solution Place 1 drop into both eyes at bedtime.  . Multiple Vitamins-Minerals (PRESERVISION AREDS 2 PO) Take by mouth. Take one capsule twice a day for vision  . omeprazole (PRILOSEC) 20 MG capsule TAKE 1 CAPSULE EVERY DAY.  . vitamin B-12 (CYANOCOBALAMIN) 1000 MCG tablet Take 1,000 mcg by mouth daily.  . [DISCONTINUED] Dextromethorphan-Guaifenesin (ROBAFEN DM) 10-100 MG/5ML liquid Take 5 mLs by mouth. 10 ml by mouth every 6 hours as need for cough in 48 hours   No facility-administered  encounter medications on file as of 11/20/2017.     Review of Systems  Immunization History  Administered Date(s) Administered  . Influenza Whole 09/22/2008, 09/16/2012, 09/30/2013  . Influenza, High Dose Seasonal PF 09/25/2017  . Influenza-Unspecified 10/02/2014, 09/15/2015, 09/27/2016  . Pneumococcal Polysaccharide-23 08/17/2006  . Td 12/17/2005   Pertinent  Health Maintenance Due  Topic Date Due  . FOOT EXAM  07/19/1931  . OPHTHALMOLOGY EXAM  07/19/1931  . PNA vac Low Risk Adult (2 of 2 - PCV13) 08/18/2007  . URINE MICROALBUMIN  04/30/2008  . HEMOGLOBIN A1C  10/17/2017  . INFLUENZA VACCINE  Completed  . DEXA SCAN  Completed   Fall Risk  09/19/2017 05/17/2016 04/20/2016 04/19/2016  Falls in the past year? No No No No   Functional Status Survey:    Vitals:   11/20/17 1416  BP: 115/60  Pulse: 76  Resp: 18  Temp: 99.1 F (37.3 C)  SpO2: 96%  Weight: 157 lb 11.2 oz (71.5 kg)  Height: 5' 5"  (1.651 m)   Body mass index is 26.24 kg/m. Physical Exam  Labs reviewed: Recent Labs    10/09/17 0700  11/03/17 1818 11/03/17 1829 11/03/17 2039 11/05/17 0346 11/19/17  NA 134*  --  136 138 137 135 138  K 3.8  --  3.7 3.6 3.7 3.9 3.9  CL 98  --  103 101 103 109  --   CO2 28  --  25  --   --  22  --   GLUCOSE 97  --  103* 102* 146* 119*  --   BUN 20  --  23* 23* 26* 34* 13  CREATININE 0.82   < > 0.89 0.90 0.90 0.84 0.9  CALCIUM 9.8  --  9.2  --   --  8.2*  --    < > = values in this interval not displayed.   Recent Labs    07/24/17 0931 08/21/17 0000 11/03/17 1818 11/19/17  AST 17  17 16 20 18   ALT 11  11 14  13* 12  ALKPHOS 90  90  --  78 74  BILITOT 0.4  0.4 0.3 0.4  --   PROT 7.0  7.0 7.0 6.5  --   ALBUMIN 3.6  3.6  --  3.1*  --    Recent Labs    11/12/17 1934  11/14/17 0334 11/14/17 1606 11/15/17 0357 11/19/17  WBC 10.8*  --  10.2  --  9.6 9.4  HGB 8.3*   < > 7.6* 9.8* 9.7* 7.8*  HCT 24.3*  --  22.9* 29.6* 29.9* 24*  MCV 86.8  --  88.8  --  88.7  --     PLT 202  --  235  --  270 328   < > = values in this interval not displayed.   Lab Results  Component Value Date   TSH 33.00 (A) 04/16/2017   Lab Results  Component Value Date   HGBA1C 5.3 04/16/2017   Lab Results  Component Value Date   CHOL 188 04/12/2016   HDL 62 04/12/2016   LDLCALC 102 04/12/2016   TRIG 122 04/12/2016   CHOLHDL 3.0 Ratio 06/07/2008    Significant Diagnostic Results in last 30 days:  Nm Gi Blood Loss  Result Date: 11/06/2017 CLINICAL DATA:  Dark bloody stools EXAM: NUCLEAR MEDICINE GASTROINTESTINAL BLEEDING SCAN TECHNIQUE: Sequential abdominal images were obtained following intravenous administration of Tc-65mlabeled red blood cells. RADIOPHARMACEUTICALS:  21.1 mCi Tc-962mn-vitro labeled red cells. COMPARISON:  11/03/2017 FINDINGS: Physiologic uptake in the liver and spleen. Excretory uptake in the bladder. Vascular activity in the aorta and bilateral iliac arteries. No findings to suggest active GI bleeding. IMPRESSION: No findings to suggest active GI bleeding. Electronically Signed   By: SrJulian Hy.D.   On: 11/06/2017 15:17   Nm Gi Blood Loss  Result Date: 11/04/2017 CLINICAL DATA:  [age]ear old female with bright red blood per rectum. EXAM: NUCLEAR MEDICINE GASTROINTESTINAL BLEEDING SCAN TECHNIQUE: Sequential abdominal images were obtained following intravenous administration of Tc-9948mbeled red blood cells. RADIOPHARMACEUTICALS:  27.2 mCi Tc-1m70mvitro labeled red cells. COMPARISON:  PET-CT dated 01/30/2010 FINDINGS: There is physiologic uptake in the liver, spleen as well as physiologic  activity in the aorta, IVC, and iliac vasculature. There is physiologic uptake and excretion of radiotracer by the kidneys and in the bladder. No abnormal activity identified conforming to the bowel to suggest active GI bleed. IMPRESSION: No evidence of active GI bleed. Electronically Signed   By: Anner Crete M.D.   On: 11/04/2017 02:13     Assessment/Plan There are no diagnoses linked to this encounter.   Family/ staff Communication:   Labs/tests ordered:

## 2017-11-20 NOTE — Assessment & Plan Note (Addendum)
Went over care plan with the patient, patient's son, HPOA,  [name].  DON RN Earley Favor presented during the conversation, care planning from 2:10pm to 3:00 pm, have gone over MOST form and filled it out. Patient decided to be Do Not Resuscitate, DNR form was filled out and signed. Will transfer to hospital if indicated with limited additional interventions, avoid intensive care. Antibiotics if indicated/determine use or limitation of antibiotics for infection. IV fluids if indicted for defined period. No feeding tube insertion. The patient and her HPOA have signed the forms along with myself and DON, RN [name]. I spend 50 minutes doing advance care plan meeting with the patient and her HPOA son [name], questions answered.

## 2017-11-22 ENCOUNTER — Encounter: Payer: Self-pay | Admitting: Internal Medicine

## 2017-11-22 ENCOUNTER — Non-Acute Institutional Stay (SKILLED_NURSING_FACILITY): Payer: Medicare HMO | Admitting: Internal Medicine

## 2017-11-22 ENCOUNTER — Other Ambulatory Visit: Payer: Self-pay | Admitting: *Deleted

## 2017-11-22 DIAGNOSIS — Z8719 Personal history of other diseases of the digestive system: Secondary | ICD-10-CM | POA: Diagnosis not present

## 2017-11-22 DIAGNOSIS — D500 Iron deficiency anemia secondary to blood loss (chronic): Secondary | ICD-10-CM

## 2017-11-22 DIAGNOSIS — Z7189 Other specified counseling: Secondary | ICD-10-CM

## 2017-11-22 DIAGNOSIS — R531 Weakness: Secondary | ICD-10-CM

## 2017-11-27 DIAGNOSIS — Z85828 Personal history of other malignant neoplasm of skin: Secondary | ICD-10-CM | POA: Diagnosis not present

## 2017-11-27 DIAGNOSIS — L821 Other seborrheic keratosis: Secondary | ICD-10-CM | POA: Diagnosis not present

## 2017-11-27 DIAGNOSIS — D1801 Hemangioma of skin and subcutaneous tissue: Secondary | ICD-10-CM | POA: Diagnosis not present

## 2017-11-28 LAB — CBC AND DIFFERENTIAL
HCT: 26 — AB (ref 36–46)
Hemoglobin: 8.7 — AB (ref 12.0–16.0)
PLATELETS: 319 (ref 150–399)
WBC: 8.6

## 2017-11-29 ENCOUNTER — Other Ambulatory Visit: Payer: Self-pay | Admitting: *Deleted

## 2017-12-03 NOTE — Progress Notes (Signed)
[facility]  Provider: Blanchie Serve MD   Location:  [facility] Room Number: 42-B Place of Service:  SNF (31)  PCP: Blanchie Serve, MD Patient Care Team: Blanchie Serve, MD as PCP - General (Internal Medicine) Lanelle Bal., MD as Referring Physician (Internal Medicine) Satira Sark, MD as Consulting Physician (Cardiology) Kyung Rudd, MD as Consulting Physician (Radiation Oncology)  Extended Emergency Contact Information Primary Emergency Contact: Neita Garnet of [place] Phone: [phone] Relation: Son Secondary Emergency Contact: Decarlo,Lance Address: [address]          [place], [place] [address] [name] of [place] Phone: [phone] Mobile Phone: [phone] Relation: Son  Code Status: DNR  Goals of Care: Advanced Directive information Advanced Directives 11/22/2017  Does Patient Have a Medical Advance Directive? Yes  Type of Advance Directive Living will  Does patient want to make changes to medical advance directive? No - Patient declined  Copy of [place] in Chart? -  Would patient like information on creating a medical advance directive? -  Pre-existing out of facility DNR order (yellow form or pink MOST form) -      Chief Complaint  Patient presents with  . Acute Visit    Drop in hemoglobin and goals of care    HPI: Patient is a 83 y.o. female seen today for acute visit. She has had a drop in her hemoglobin from 9.7 to 7.8 to 7.3 now. She was in the hospital recently with diverticular bleed that required hemostatic clip. She is seen in her room today with her son present. She denies any rectal bleed. Guaiac stool 1st is positive and second test is negative. She denies any nausea or vomiting today. She is afebrile. She feels weak and tired.   Past Medical History:  Diagnosis Date  . Allergic rhinitis   . Anemia    NOS  . Anxiety   . Biceps  tendon rupture    Bilateral  . Breast cancer ([facility]) 2001   Left s/p lumpectomy and XRT   . Bronchiectasis ([facility]) 2014   Noted on chest x-ray  . Complication of anesthesia    slow to wake up  . Cough 2014  . Depression   . Diverticulosis   . Elevated liver enzymes    Biopsy consistent with steatohepatitis  . Fundic gland polyps of stomach, benign   . GERD (gastroesophageal reflux disease) 06/2007   EGD Dr Gala Romney >sm HH, multiple fundic gland polyps  . Glaucoma 2013   both eyes  . Hemorrhoids   . Hiatal hernia   . Hx of radiation therapy 07/17/10 to 07/21/10   SRS LLL lung  . Hyperlipidemia   . Insomnia   . Low back pain    scoliosis  . Lung cancer ([facility]) 05/2010   Left 2011 - rad x 3  . Lymphedema    Left arm  . Osteoarthritis   . Osteopenia 10/25/2016  . Osteoporosis, senile   . Overactive bladder   . Pneumonia    RLL with sepsis  . PSVT (paroxysmal supraventricular tachycardia) ([facility])   . Rheumatic fever    Age 33  . Right thyroid nodule   . Steatohepatitis    liver bx  . Type 2 diabetes mellitus ([facility])    Past Surgical History:  Procedure Laterality Date  . ABDOMINAL HYSTERECTOMY  1964  . APPENDECTOMY  1964  . BIOPSY THYROID  11/2008  . BREAST  BIOPSY     X 3 w/ cystectomy  . BREAST LUMPECTOMY  2001  . CATARACT EXTRACTION Bilateral 2003   Lens implant Dr. Charise Killian  . CHOLECYSTECTOMY  1965  . COLONOSCOPY  09/11/2011   Procedure: COLONOSCOPY;  Surgeon: Dorothyann Peng, MD;  [facility];  Service: Endoscopy;  Laterality: N/A;  . COLONOSCOPY  2005 /2012   Dr. Lucianne Muss- diverticulosis, ext hemorrhoids.  . COLONOSCOPY WITH PROPOFOL N/A 11/09/2017   Procedure: COLONOSCOPY WITH PROPOFOL;  Surgeon: Mauri Pole, MD;  [facility];  Service: Endoscopy;  Laterality: N/A;  . COLONOSCOPY WITH PROPOFOL N/A 11/12/2017   Procedure: COLONOSCOPY WITH PROPOFOL;  Surgeon: Ladene Artist, MD;  [facility];  Service: Endoscopy;  Laterality: N/A;  .  CYSTOSCOPY  2007  . ESOPHAGOGASTRODUODENOSCOPY  09/11/2011   Procedure: ESOPHAGOGASTRODUODENOSCOPY (EGD);  Surgeon: Dorothyann Peng, MD;  [facility];  Service: Endoscopy;  Laterality: N/A;  . ESOPHAGOGASTRODUODENOSCOPY (EGD) WITH PROPOFOL N/A 11/08/2017   Procedure: ESOPHAGOGASTRODUODENOSCOPY (EGD) WITH PROPOFOL;  Surgeon: Mauri Pole, MD;  [facility];  Service: Endoscopy;  Laterality: N/A;  . LIVER BIOPSY  2008  . LUNG BIOPSY Left 06/06/2010  . REVISION TOTAL HIP ARTHROPLASTY  2007   Left  . SKIN CANCER EXCISION     nose and left lower cheek  . TONSILLECTOMY  1930  . TOTAL HIP ARTHROPLASTY Bilateral 2005 & 2007    Dr. Earl Lagos. Aline Brochure     reports that  has never smoked. she has never used smokeless tobacco. She reports that she does not drink alcohol or use drugs. Social History   Socioeconomic History  . Marital status: Widowed    Spouse name: Not on file  . Number of children: 2  . Years of education: college   . Highest education level: Not on file  Social Needs  . Financial resource strain: Not on file  . Food insecurity - worry: Not on file  . Food insecurity - inability: Not on file  . Transportation needs - medical: Not on file  . Transportation needs - non-medical: Not on file  Occupational History  . Occupation: retired Tax Comptroller: RETIRED  Tobacco Use  . Smoking status: Never Smoker  . Smokeless tobacco: Never Used  . Tobacco comment: 2nd hand-husband  Substance and Sexual Activity  . Alcohol use: No  . Drug use: No  . Sexual activity: Not Currently  Other Topics Concern  . Not on file  Social History Narrative   Lives at [facility] 02/2013   2 adopted children   Was in [place], [place]   Widowed 2013   Walks with walker   Exercise: none    POA - Living Will     Family History  Problem Relation Age of Onset  . Heart failure Mother   . Osteoporosis Mother   . Hypertension Father   . Stroke  Father   . Heart failure Father   . Leukemia Brother   . Heart failure Brother   . Cancer Maternal Grandmother   . Stroke Maternal Grandfather   . Cancer Unknown     Health Maintenance  Topic Date Due  . FOOT EXAM  07/19/1931  . OPHTHALMOLOGY EXAM  07/19/1931  . PNA vac Low Risk Adult (2 of 2 - PCV13) 08/18/2007  . URINE MICROALBUMIN  04/30/2008  . TETANUS/TDAP  12/18/2015  . HEMOGLOBIN A1C  10/17/2017  . INFLUENZA VACCINE  Completed  .  DEXA SCAN  Completed    Allergies  Allergen Reactions  . Augmentin [Amoxicillin-Pot Clavulanate]   . Ibuprofen Hives  . Naproxen   . Statins Other (See Comments)    Feels like knives in stomach  . Surgilube [Gyne-Moistrin]     Rectal itching, burning  . Tape Rash    Outpatient Encounter Medications as of 11/22/2017  Medication Sig  . acetaminophen (TYLENOL) 325 MG tablet Take 650 mg by mouth every 6 (six) hours as needed.   Marland Kitchen atenolol (TENORMIN) 25 MG tablet Take 0.5 tablets (12.5 mg total) by mouth 2 (two) times daily.  . cetirizine (ZYRTEC) 10 MG tablet Take 10 mg by mouth daily.  . dorzolamide-timolol (COSOPT) 22.3-6.8 MG/ML ophthalmic solution Place 1 drop into both eyes 2 (two) times daily.   . DULoxetine (CYMBALTA) 20 MG capsule TAKE (1) CAPSULE DAILY.  Marland Kitchen Ferrous Gluconate (IRON 27 PO) Take 1 tablet by mouth daily.  . folic acid (FOLVITE) 154 MCG tablet Take 400 mcg by mouth daily.  . hydrocortisone (ANUSOL-HC) 2.5 % rectal cream Place rectally 2 (two) times daily.  Marland Kitchen latanoprost (XALATAN) 0.005 % ophthalmic solution Place 1 drop into both eyes at bedtime.  . Multiple Vitamins-Minerals (PRESERVISION AREDS 2 PO) Take 1 capsule by mouth 2 (two) times daily.   Marland Kitchen omeprazole (PRILOSEC) 20 MG capsule TAKE 1 CAPSULE EVERY DAY.  . vitamin B-12 (CYANOCOBALAMIN) 1000 MCG tablet Take 1,000 mcg by mouth daily.  . [DISCONTINUED] aspirin 81 MG EC tablet Take 1 tablet (81 mg total) by mouth daily.   No facility-administered encounter medications  on file as of 11/22/2017.     Review of Systems  Constitutional: Negative for appetite change, diaphoresis and fever.  Respiratory: Negative for shortness of breath.   Cardiovascular: Negative for chest pain and palpitations.  Gastrointestinal: Negative for abdominal pain, blood in stool, constipation, diarrhea, nausea and vomiting.  Genitourinary: Negative for hematuria.  Musculoskeletal: Positive for gait problem.  Neurological: Positive for weakness. Negative for dizziness and headaches.  Psychiatric/Behavioral: Negative for confusion.    Vitals:   11/22/17 1350  BP: 140/80  Pulse: 74  Resp: 20  Temp: 99.7 F (37.6 C)  TempSrc: Oral  SpO2: 95%  Weight: 157 lb 11.2 oz (71.5 kg)  Height: 5' 6"  (1.676 m)   Body mass index is 25.45 kg/m. Physical Exam  Constitutional: She appears well-developed and well-nourished. No distress.  frail  HENT:  Head: Normocephalic and atraumatic.  Mouth/Throat: Oropharynx is clear and moist.  Eyes: Conjunctivae and EOM are normal. Right eye exhibits no discharge. Left eye exhibits no discharge.  Neck: Neck supple.  Cardiovascular:  Irregular heart rate  Pulmonary/Chest: Effort normal and breath sounds normal.  Abdominal: Soft. Bowel sounds are normal. There is no tenderness.  Musculoskeletal:  Generalized weakness, wheelchair bound  Lymphadenopathy:    She has no cervical adenopathy.  Skin: She is not diaphoretic.    Labs reviewed:  11/15/17- 9.7  11/19/17 7.8  11/20/17 7.3   Procedures since last visit: Nm Gi Blood Loss  Result Date: 11/06/2017 CLINICAL DATA:  Dark bloody stools EXAM: NUCLEAR MEDICINE GASTROINTESTINAL BLEEDING SCAN TECHNIQUE: Sequential abdominal images were obtained following intravenous administration of Tc-66mlabeled red blood cells. RADIOPHARMACEUTICALS:  21.1 mCi Tc-916mn-vitro labeled red cells. COMPARISON:  11/03/2017 FINDINGS: Physiologic uptake in the liver and spleen. Excretory uptake in the bladder.  Vascular activity in the aorta and bilateral iliac arteries. No findings to suggest active GI bleeding. IMPRESSION: No findings to suggest active GI  bleeding. Electronically Signed   By: Julian Hy M.D.   On: 11/06/2017 15:17   Nm Gi Blood Loss  Result Date: 11/04/2017 CLINICAL DATA:  83 year old female with bright red blood per rectum. EXAM: NUCLEAR MEDICINE GASTROINTESTINAL BLEEDING SCAN TECHNIQUE: Sequential abdominal images were obtained following intravenous administration of Tc-83mlabeled red blood cells. RADIOPHARMACEUTICALS:  27.2 mCi Tc-929mn-vitro labeled red cells. COMPARISON:  PET-CT dated 01/30/2010 FINDINGS: There is physiologic uptake in the liver, spleen as well as physiologic activity in the aorta, IVC, and iliac vasculature. There is physiologic uptake and excretion of radiotracer by the kidneys and in the bladder. No abnormal activity identified conforming to the bowel to suggest active GI bleed. IMPRESSION: No evidence of active GI bleed. Electronically Signed   By: ArAnner Crete.D.   On: 11/04/2017 02:13    Assessment/Plan  Blood loss anemia Drop in Hb ongoing, guaiac stool with one positive and another negative. No active bleed. Feels weak and tired but has been deconditioned and this contribute to it. Vital signs stable. Likely from GI bleed with diverticular bleed. Pt does not want any further EGD/ colonoscopy or invasive workup. She would like blood transfusion if indicated. Monitor vital signs, pending 3rd guaiac test. Check cbc.   Diverticular bleed GI outpt follow up. Monitor clinically.   Generalized weakness Her deconditioning and anemia could both be contributing to it. Will have her work with physical therapy and occupational therapy team to help with gait training and muscle strengthening exercises.fall precautions. Skin care. Encourage to be out of bed. Monitor h&h.   Goals of care Reviewed further with pt and son. DNR. MOST form in chart. Pt would  like hospitalization if needed for blood transfusion but is clear that she would not want any further invasive GI workup. She agrees to see GI in outpt clinic. Appointment to be made.    Labs/tests ordered:  Cbc, guaiac stool   MABlanchie ServeMD Internal Medicine [facility] [address] [address] Phone (Monday-Friday 8 am - 5 pm): [phone] Call: [phone] follow prompts after 5 pm and on weekends Office Phone: [phone] Fax: [phone]

## 2017-12-04 ENCOUNTER — Non-Acute Institutional Stay (SKILLED_NURSING_FACILITY): Payer: Medicare HMO | Admitting: Nurse Practitioner

## 2017-12-04 ENCOUNTER — Encounter: Payer: Self-pay | Admitting: Nurse Practitioner

## 2017-12-04 DIAGNOSIS — D500 Iron deficiency anemia secondary to blood loss (chronic): Secondary | ICD-10-CM | POA: Diagnosis not present

## 2017-12-04 DIAGNOSIS — E440 Moderate protein-calorie malnutrition: Secondary | ICD-10-CM

## 2017-12-04 DIAGNOSIS — E871 Hypo-osmolality and hyponatremia: Secondary | ICD-10-CM | POA: Diagnosis not present

## 2017-12-04 NOTE — Progress Notes (Signed)
Location:  [facility] Room Number: 42-B Place of Service:  SNF (31) Provider:  Kobi Aller, [place]  NP  Blanchie Serve, MD  Patient Care Team: Blanchie Serve, MD as PCP - General (Internal Medicine) Lanelle Bal., MD as Referring Physician (Internal Medicine) Satira Sark, MD as Consulting Physician (Cardiology) Kyung Rudd, MD as Consulting Physician (Radiation Oncology)  Extended Emergency Contact Information Primary Emergency Contact: Neita Garnet of [place] Phone: [phone] Relation: Son Secondary Emergency Contact: Millon,Lance Address: [address]          [place], [place] [address] [name] of [place] Phone: [phone] Mobile Phone: [phone] Relation: Son  Code Status:  DNR Goals of care: Advanced Directive information Advanced Directives 12/04/2017  Does Patient Have a Medical Advance Directive? Yes  Type of Advance Directive Living will;Out of facility DNR (pink MOST or yellow form);Healthcare Power of Attorney  Does patient want to make changes to medical advance directive? No - Patient declined  Copy of [place] in Chart? Yes  Would patient like information on creating a medical advance directive? No - Patient declined  Pre-existing out of facility DNR order (yellow form or pink MOST form) Pink MOST form placed in chart (order not valid for inpatient use)     Chief Complaint  Patient presents with  . Acute Visit    Anemia    HPI:  Pt is a 83 y.o. female seen today for an acute visit for anemia, she takes Z73, Iron, and folic acid, Hgb 7.9 56/7/01,  hyponatremia, protein calorie malnutrition. Hgb 12/05/17 showed 8.9, Na 133, TP 5.7, albumin 2.7.    Past Medical History:  Diagnosis Date  . Allergic rhinitis   . Anemia    NOS  . Anxiety   . Biceps tendon rupture    Bilateral  . Breast cancer ([place]) 2001   Left s/p lumpectomy and XRT   . Bronchiectasis ([place]) 2014   Noted on chest x-ray  . Complication of anesthesia    slow to wake up  . Cough 2014  . Depression   . Diverticulosis   . Elevated liver enzymes    Biopsy consistent with steatohepatitis  . Fundic gland polyps of stomach, benign   . GERD (gastroesophageal reflux disease) 06/2007   EGD Dr Gala Romney >sm HH, multiple fundic gland polyps  . Glaucoma 2013   both eyes  . Hemorrhoids   . Hiatal hernia   . Hx of radiation therapy 07/17/10 to 07/21/10   SRS LLL lung  . Hyperlipidemia   . Insomnia   . Low back pain    scoliosis  . Lung cancer ([place]) 05/2010   Left 2011 - rad x 3  . Lymphedema    Left arm  . Osteoarthritis   . Osteopenia 10/25/2016  . Osteoporosis, senile   . Overactive bladder   . Pneumonia    RLL with sepsis  . PSVT (paroxysmal supraventricular tachycardia) ([place])   . Rheumatic fever    Age 34  . Right thyroid nodule   . Steatohepatitis    liver bx  . Type 2 diabetes mellitus ([place])    Past Surgical History:  Procedure Laterality Date  . ABDOMINAL HYSTERECTOMY  1964  . APPENDECTOMY  1964  . BIOPSY THYROID  11/2008  . BREAST BIOPSY     X 3 w/ cystectomy  . BREAST LUMPECTOMY  2001  . CATARACT EXTRACTION Bilateral 2003   Lens implant Dr. Charise Killian  . CHOLECYSTECTOMY  1965  . COLONOSCOPY  09/11/2011   Procedure: COLONOSCOPY;  Surgeon: Dorothyann Peng, MD;  [facility];  Service: Endoscopy;  Laterality: N/A;  . COLONOSCOPY  2005 /2012   Dr. Lucianne Muss- diverticulosis, ext hemorrhoids.  . COLONOSCOPY WITH PROPOFOL N/A 11/09/2017   Procedure: COLONOSCOPY WITH PROPOFOL;  Surgeon: Mauri Pole, MD;  Location: [facility];  Service: Endoscopy;  Laterality: N/A;  . COLONOSCOPY WITH PROPOFOL N/A 11/12/2017   Procedure: COLONOSCOPY WITH PROPOFOL;  Surgeon: Ladene Artist, MD;  Location: [facility];  Service: Endoscopy;  Laterality: N/A;  . CYSTOSCOPY  2007  . ESOPHAGOGASTRODUODENOSCOPY  09/11/2011   Procedure: ESOPHAGOGASTRODUODENOSCOPY (EGD);  Surgeon: Dorothyann Peng, MD;  [facility];  Service: Endoscopy;  Laterality: N/A;  . ESOPHAGOGASTRODUODENOSCOPY (EGD) WITH PROPOFOL N/A 11/08/2017   Procedure: ESOPHAGOGASTRODUODENOSCOPY (EGD) WITH PROPOFOL;  Surgeon: Mauri Pole, MD;  Location[facility];  Service: Endoscopy;  Laterality: N/A;  . LIVER BIOPSY  2008  . LUNG BIOPSY Left 06/06/2010  . REVISION TOTAL HIP ARTHROPLASTY  2007   Left  . SKIN CANCER EXCISION     nose and left lower cheek  . TONSILLECTOMY  1930  . TOTAL HIP ARTHROPLASTY Bilateral 2005 & 2007    Dr. Earl Lagos. Aline Brochure     Allergies  Allergen Reactions  . Augmentin [Amoxicillin-Pot Clavulanate]   . Ibuprofen Hives  . Naproxen   . Statins Other (See Comments)    Feels like knives in stomach  . Surgilube [Gyne-Moistrin]     Rectal itching, burning  . Tape Rash    Outpatient Encounter Medications as of 12/04/2017  Medication Sig  . acetaminophen (TYLENOL) 325 MG tablet Take 650 mg by mouth every 6 (six) hours as needed.   Marland Kitchen atenolol (TENORMIN) 25 MG tablet Take 0.5 tablets (12.5 mg total) by mouth 2 (two) times daily.  . cetirizine (ZYRTEC) 10 MG tablet Take 10 mg by mouth daily.  . dorzolamide-timolol (COSOPT) 22.3-6.8 MG/ML ophthalmic solution Place 1 drop into both eyes 2 (two) times daily.   . DULoxetine (CYMBALTA) 20 MG capsule TAKE (1) CAPSULE DAILY.  Marland Kitchen Ferrous Gluconate (IRON 27 PO) Take 1 tablet by mouth daily.  . folic acid (FOLVITE) 235 MCG tablet Take 400 mcg by mouth daily.  . hydrocortisone (ANUSOL-HC) 2.5 % rectal cream Place rectally 2 (two) times daily.  Marland Kitchen latanoprost (XALATAN) 0.005 % ophthalmic solution Place 1 drop into both eyes at bedtime.  . Multiple Vitamins-Minerals (PRESERVISION AREDS 2 PO) Take 1 capsule by mouth 2 (two) times daily.   Marland Kitchen omeprazole (PRILOSEC) 20 MG capsule TAKE 1 CAPSULE EVERY DAY.  . vitamin B-12 (CYANOCOBALAMIN) 1000 MCG tablet Take 1,000 mcg by mouth daily.   No facility-administered encounter  medications on file as of 12/04/2017.    ROS was provided with assistance of staff Review of Systems  Constitutional: Negative for activity change, appetite change, chills, diaphoresis, fatigue and fever.  HENT: Negative for congestion, trouble swallowing and voice change.   Eyes: Negative for visual disturbance.  Respiratory: Negative for cough, choking, shortness of breath and wheezing.   Cardiovascular: Negative for chest pain, palpitations and leg swelling.  Gastrointestinal: Negative for abdominal distention, abdominal pain, anal bleeding, blood in stool, constipation, diarrhea, nausea and vomiting.  Endocrine: Negative for cold intolerance.  Genitourinary: Negative for difficulty urinating, dysuria, frequency and urgency.       Incontinent episodes.   Musculoskeletal: Positive for gait problem. Negative for arthralgias and back pain.  Skin: Negative for  color change, pallor, rash and wound.  Neurological: Negative for tremors, speech difficulty, weakness and headaches.  Psychiatric/Behavioral: Negative for agitation, confusion, dysphoric mood and sleep disturbance. The patient is not nervous/anxious.        Forgetful.     Immunization History  Administered Date(s) Administered  . Influenza Whole 09/22/2008, 09/16/2012, 09/30/2013  . Influenza, High Dose Seasonal PF 09/25/2017  . Influenza-Unspecified 10/02/2014, 09/15/2015, 09/27/2016  . Pneumococcal Polysaccharide-23 08/17/2006  . Td 12/17/2005   Pertinent  Health Maintenance Due  Topic Date Due  . FOOT EXAM  07/19/1931  . OPHTHALMOLOGY EXAM  07/19/1931  . PNA vac Low Risk Adult (2 of 2 - PCV13) 08/18/2007  . URINE MICROALBUMIN  04/30/2008  . HEMOGLOBIN A1C  10/17/2017  . INFLUENZA VACCINE  Completed  . DEXA SCAN  Completed   Fall Risk  09/19/2017 05/17/2016 04/20/2016 04/19/2016  Falls in the past year? No No No No   Functional Status Survey:    Vitals:   12/04/17 1415  BP: (!) 150/70  Pulse: 60  Resp: 18  Temp: 99  F (37.2 C)  SpO2: 95%  Weight: 152 lb 9.6 oz (69.2 kg)  Height: 5' 6"  (1.676 m)   Body mass index is 24.63 kg/m. Physical Exam  Constitutional: She appears well-developed and well-nourished.  HENT:  Head: Normocephalic and atraumatic.  Eyes: Conjunctivae and EOM are normal. Pupils are equal, round, and reactive to light.  Neck: Normal range of motion. Neck supple. No JVD present. No thyromegaly present.  Cardiovascular: Normal rate and normal heart sounds.  No murmur heard. Irregular heart beats   Pulmonary/Chest: Effort normal and breath sounds normal. She has no wheezes. She has no rales.  Abdominal: Soft. Bowel sounds are normal. She exhibits no distension. There is no tenderness. There is no rebound and no guarding.  Musculoskeletal: Normal range of motion. She exhibits no edema or tenderness.  Self transfer, w/c for mobility. One person transfer, w/c for mobility.   Neurological: She is alert. She exhibits normal muscle tone. Coordination normal.  Oriented to person and place  Skin: Skin is warm and dry.  Psychiatric: She has a normal mood and affect. Her behavior is normal. Judgment and thought content normal.    Labs reviewed: Recent Labs    10/09/17 0700  11/03/17 1818 11/03/17 1829 11/03/17 2039 11/05/17 0346 11/19/17 11/20/17 12/05/17  NA 134*  --  136 138 137 135 138 139 133*  K 3.8  --  3.7 3.6 3.7 3.9 3.9 3.7 4.0  CL 98  --  103 101 103 109  --   --   --   CO2 28  --  25  --   --  22  --   --   --   GLUCOSE 97  --  103* 102* 146* 119*  --   --   --   BUN 20  --  23* 23* 26* 34* 13 13 18   CREATININE 0.82   < > 0.89 0.90 0.90 0.84 0.9 0.7 0.7  CALCIUM 9.8  --  9.2  --   --  8.2*  --   --   --    < > = values in this interval not displayed.   Recent Labs    07/24/17 0931 08/21/17 0000 11/03/17 1818 11/19/17 12/05/17  AST 17  17 16 20 18 23   ALT 11  11 14  13* 12 20  ALKPHOS 90  90  --  78 74 97  BILITOT 0.4  0.4 0.3 0.4  --   --   PROT 7.0  7.0 7.0  6.5  --   --   ALBUMIN 3.6  3.6  --  3.1*  --   --    Recent Labs    11/12/17 1934  11/14/17 0334  11/15/17 0357  11/20/17 11/28/17 12/05/17  WBC 10.8*  --  10.2  --  9.6   < > 7.9 8.6 7.2  HGB 8.3*   < > 7.6*   < > 9.7*   < > 7.3* 8.7* 8.9*  HCT 24.3*  --  22.9*   < > 29.9*   < > 23* 26* 26*  MCV 86.8  --  88.8  --  88.7  --   --   --   --   PLT 202  --  235  --  270   < > 293 319 404*   < > = values in this interval not displayed.   Lab Results  Component Value Date   TSH 2.00 11/20/2017   Lab Results  Component Value Date   HGBA1C 5.3 04/16/2017   Lab Results  Component Value Date   CHOL 188 04/12/2016   HDL 62 04/12/2016   LDLCALC 102 04/12/2016   TRIG 122 04/12/2016   CHOLHDL 3.0 Ratio 06/07/2008    Significant Diagnostic Results in last 30 days:  No results found.  Assessment/Plan Blood loss anemia Improving, continue D98, Iron, and folic acid, Hgb 7.9 33/8/25, Hgb 12/05/17    Hyponatremia Hyponatremia, 12/05/17 Na 133, at her baseline.    Protein-calorie malnutrition ([place]) protein calorie malnutrition. TP 5.7, albumin 2.7 12/05/17. Dietary supplement.       Family/ staff Communication: plan of care reviewed with the patient and charge nurse  Labs/tests ordered:  CBC CMP  Time spend 25 minutes.

## 2017-12-05 ENCOUNTER — Other Ambulatory Visit: Payer: Self-pay | Admitting: *Deleted

## 2017-12-05 DIAGNOSIS — E46 Unspecified protein-calorie malnutrition: Secondary | ICD-10-CM | POA: Insufficient documentation

## 2017-12-05 LAB — CBC AND DIFFERENTIAL
HCT: 26 — AB (ref 36–46)
HEMOGLOBIN: 8.9 — AB (ref 12.0–16.0)
Platelets: 404 — AB (ref 150–399)
WBC: 7.2

## 2017-12-05 LAB — BASIC METABOLIC PANEL
BUN: 18 (ref 4–21)
CREATININE: 0.7 (ref <=1.1)
GLUCOSE: 75
POTASSIUM: 4 (ref 3.4–5.3)
SODIUM: 133 — AB (ref 137–147)

## 2017-12-05 LAB — HEPATIC FUNCTION PANEL
ALT: 20 (ref 7–35)
AST: 23 (ref 13–35)
Alkaline Phosphatase: 97 (ref 25–125)
Bilirubin, Total: 0.3

## 2017-12-05 NOTE — Assessment & Plan Note (Signed)
Hyponatremia, 12/05/17 Na 133, at her baseline.

## 2017-12-05 NOTE — Assessment & Plan Note (Signed)
protein calorie malnutrition. TP 5.7, albumin 2.7 12/05/17. Dietary supplement.

## 2017-12-05 NOTE — Assessment & Plan Note (Signed)
Improving, continue I21, Iron, and folic acid, Hgb 7.9 79/8/10, Hgb 12/05/17

## 2017-12-13 ENCOUNTER — Encounter: Payer: Self-pay | Admitting: Nurse Practitioner

## 2017-12-13 ENCOUNTER — Non-Acute Institutional Stay (SKILLED_NURSING_FACILITY): Payer: Medicare HMO | Admitting: Nurse Practitioner

## 2017-12-13 DIAGNOSIS — D500 Iron deficiency anemia secondary to blood loss (chronic): Secondary | ICD-10-CM | POA: Diagnosis not present

## 2017-12-13 DIAGNOSIS — F418 Other specified anxiety disorders: Secondary | ICD-10-CM | POA: Diagnosis not present

## 2017-12-13 DIAGNOSIS — I482 Chronic atrial fibrillation: Secondary | ICD-10-CM | POA: Diagnosis not present

## 2017-12-13 DIAGNOSIS — I10 Essential (primary) hypertension: Secondary | ICD-10-CM

## 2017-12-13 DIAGNOSIS — E43 Unspecified severe protein-calorie malnutrition: Secondary | ICD-10-CM | POA: Diagnosis not present

## 2017-12-13 DIAGNOSIS — E871 Hypo-osmolality and hyponatremia: Secondary | ICD-10-CM | POA: Diagnosis not present

## 2017-12-13 DIAGNOSIS — K219 Gastro-esophageal reflux disease without esophagitis: Secondary | ICD-10-CM

## 2017-12-13 DIAGNOSIS — R531 Weakness: Secondary | ICD-10-CM

## 2017-12-13 DIAGNOSIS — I4821 Permanent atrial fibrillation: Secondary | ICD-10-CM

## 2017-12-13 DIAGNOSIS — R69 Illness, unspecified: Secondary | ICD-10-CM | POA: Diagnosis not present

## 2017-12-13 DIAGNOSIS — K5731 Diverticulosis of large intestine without perforation or abscess with bleeding: Secondary | ICD-10-CM

## 2017-12-13 NOTE — Assessment & Plan Note (Signed)
Improved, able to function at [place] level at [facility]

## 2017-12-13 NOTE — Assessment & Plan Note (Signed)
Her mood is stable, continue Cymbalta.

## 2017-12-13 NOTE — Assessment & Plan Note (Signed)
s/p 4 hemostatic clips. She was transfused several units of bleed, her Hgb was 9.7 upon hospital discharge. She has regained physical strength and energy, able to function at IL level. Her Hgb has been stabilized from 7.8 11/19/17 to 8.9 12/07/17, will continue Fe, Vit J61, Folic acid, and Omeprazole. Will need to f/u GI and PCP in 2-4 weeks.

## 2017-12-13 NOTE — Assessment & Plan Note (Signed)
Stable, continue Omeprazole 9m po daily.

## 2017-12-13 NOTE — Assessment & Plan Note (Signed)
S/p diverticulosis with bleed, 4x hemastatic clips, several units of blood transfusion, continue Fe, Vit X64, Folic acid, update CBC prior to the next appointment in clinic [facility]

## 2017-12-13 NOTE — Progress Notes (Signed)
Location:  [facility] Room Number: 42-B Place of Service:  SNF (31)  Provider: Marlana Latus  NP  PCP: Blanchie Serve, MD Patient Care Team: Blanchie Serve, MD as PCP - General (Internal Medicine) Lanelle Bal., MD as Referring Physician (Internal Medicine) Satira Sark, MD as Consulting Physician (Cardiology) Kyung Rudd, MD as Consulting Physician (Radiation Oncology)  Extended Emergency Contact Information Primary Emergency Contact: Neita Garnet of [place] Phone: [phone] Relation: Son Secondary Emergency Contact: Brugh,Lance Address: [address]          [place], [place] [address] [name] of [place] Phone: [phone] Mobile Phone: [phone] Relation: Son  Code Status: Full Code Goals of care:  Advanced Directive information Advanced Directives 12/13/2017  Does Patient Have a Medical Advance Directive? Yes  Type of Advance Directive Living will;Out of facility DNR (pink MOST or yellow form);Healthcare Power of Attorney  Does patient want to make changes to medical advance directive? No - Patient declined  Copy of Standard in Chart? Yes  Would patient like information on creating a medical advance directive? No - Patient declined  Pre-existing out of facility DNR order (yellow form or pink MOST form) Pink MOST form placed in chart (order not valid for inpatient use)     Allergies  Allergen Reactions  . Augmentin [Amoxicillin-Pot Clavulanate]   . Ibuprofen Hives  . Naproxen   . Statins Other (See Comments)    Feels like knives in stomach  . Surgilube [Gyne-Moistrin]     Rectal itching, burning  . Tape Rash    Chief Complaint  Patient presents with  . Discharge Note    Back to [place]    HPI:  83 y.o. female was admitted to SN[facility] following hospitalization 11/03/17 to 11/15/17 for diverticulosis with bleed and s/p 4 hemostatic clips. She was transfused several units of  bleed, her Hgb was 9.7 upon hospital discharge. She has regained physical strength and energy, able to function at [place] level. Her Hgb has been stabilized from 7.8 11/19/17 to 8.9 12/07/17, she is taking Fe, Vit I62, Folic acid, and Omeprazole. Will need to f/u GI and PCP in 2-4 weeks. Her mood is stable on Cymbalta. Her blood pressures and heart rate is in control while on Atenolol 12.76m bid.     Past Medical History:  Diagnosis Date  . Allergic rhinitis   . Anemia    NOS  . Anxiety   . Biceps tendon rupture    Bilateral  . Breast [place] [place]   Left s/p lumpectomy and XRT   . [place]ct[place]   Noted on chest x-ray  . Complication of anesthesia    slow to wake up  . Cough 2014  . Depression   . Diverticulosis   . Elevated liver enzymes    Biopsy consistent with steatohepatitis  . Fundic gland polyps of stomach, benign   . GERD (gastroesophageal reflux disease) 06/2007   EGD Dr RGala Romney>sm HH, multiple fundic gland polyps  . Glaucoma 2013   both eyes  . Hemorrhoids   . Hiatal hernia   . Hx of radiation therapy 07/17/10 to 07/21/10   SRS LLL lung  . Hyperlipidemia   . Insomnia   . Low back pain    scoliosis  . Lung cancer [place] 05[place]   Left 2011 - rad x 3  . Lymphedema    Left arm  . Osteoarthritis   . Osteopenia 10/25/2016  .  Osteoporosis, senile   . Overactive bladder   . Pneumonia    RLL with sepsis  . PSVT (paroxysmal supraventricular tachycardia) ([place])   . Rheumatic fever    Age 54  . Right thyroid nodule   . Steatohepatitis    liver bx  . Type 2 diabetes mellitus ([place])     Past Surgical History:  Procedure Laterality Date  . ABDOMINAL HYSTERECTOMY  1964  . APPENDECTOMY  1964  . BIOPSY THYROID  11/2008  . BREAST BIOPSY     X 3 w/ cystectomy  . BREAST LUMPECTOMY  2001  . CATARACT EXTRACTION Bilateral 2003   Lens implant Dr. Charise Killian  . CHOLECYSTECTOMY  1965  . COLONOSCOPY  09/11/2011   Procedure: COLONOSCOPY;  Surgeon: Dorothyann Peng, MD;   [facility];  Service: Endoscopy;  Laterality: N/A;  . COLONOSCOPY  2005 /2012   Dr. Lucianne Muss- diverticulosis, ext hemorrhoids.  . COLONOSCOPY WITH PROPOFOL N/A 11/09/2017   Procedure: COLONOSCOPY WITH PROPOFOL;  Surgeon: Mauri Pole, MD;  [facility];  Service: Endoscopy;  Laterality: N/A;  . COLONOSCOPY WITH PROPOFOL N/A 11/12/2017   Procedure: COLONOSCOPY WITH PROPOFOL;  Surgeon: Ladene Artist, MD;  [facility];  Service: Endoscopy;  Laterality: N/A;  . CYSTOSCOPY  2007  . ESOPHAGOGASTRODUODENOSCOPY  09/11/2011   Procedure: ESOPHAGOGASTRODUODENOSCOPY (EGD);  Surgeon: Dorothyann Peng, MD;  [facility];  Service: Endoscopy;  Laterality: N/A;  . ESOPHAGOGASTRODUODENOSCOPY (EGD) WITH PROPOFOL N/A 11/08/2017   Procedure: ESOPHAGOGASTRODUODENOSCOPY (EGD) WITH PROPOFOL;  Surgeon: Mauri Pole, MD;  [facility];  Service: Endoscopy;  Laterality: N/A;  . LIVER BIOPSY  2008  . LUNG BIOPSY Left 06/06/2010  . REVISION TOTAL HIP ARTHROPLASTY  2007   Left  . SKIN CANCER EXCISION     nose and left lower cheek  . TONSILLECTOMY  1930  . TOTAL HIP ARTHROPLASTY Bilateral 2005 & 2007    Dr. Earl Lagos. Aline Brochure       reports that  has never smoked. she has never used smokeless tobacco. She reports that she does not drink alcohol or use drugs. Social History   Socioeconomic History  . Marital status: Widowed    Spouse name: Not on file  . Number of children: 2  . Years of education: college   . Highest education level: Not on file  Social Needs  . Financial resource strain: Not on file  . Food insecurity - worry: Not on file  . Food insecurity - inability: Not on file  . Transportation needs - medical: Not on file  . Transportation needs - non-medical: Not on file  Occupational History  . Occupation: retired Tax Comptroller: RETIRED  Tobacco Use  . Smoking status: Never Smoker  . Smokeless tobacco: Never Used  .  Tobacco comment: 2nd hand-husband  Substance and Sexual Activity  . Alcohol use: No  . Drug use: No  . Sexual activity: Not Currently  Other Topics Concern  . Not on file  Social History Narrative   Lives at [place] 02/2013   2 adopted children   Was in [place], [place]   Widowed 2013   Walks with walker   Exercise: none    POA - Living Will   Functional Status Survey:    Allergies  Allergen Reactions  . Augmentin [Amoxicillin-Pot Clavulanate]   . Ibuprofen Hives  . Naproxen   . Statins Other (See Comments)    Feels like knives  in stomach  . Surgilube [Gyne-Moistrin]     Rectal itching, burning  . Tape Rash    Pertinent  Health Maintenance Due  Topic Date Due  . FOOT EXAM  07/19/1931  . OPHTHALMOLOGY EXAM  07/19/1931  . PNA vac Low Risk Adult (2 of 2 - PCV13) 08/18/2007  . URINE MICROALBUMIN  04/30/2008  . HEMOGLOBIN A1C  10/17/2017  . INFLUENZA VACCINE  Completed  . DEXA SCAN  Completed    Medications: Outpatient Encounter Medications as of 12/13/2017  Medication Sig  . acetaminophen (TYLENOL) 325 MG tablet Take 650 mg by mouth every 6 (six) hours as needed.   Marland Kitchen atenolol (TENORMIN) 25 MG tablet Take 0.5 tablets (12.5 mg total) by mouth 2 (two) times daily.  . cetirizine (ZYRTEC) 10 MG tablet Take 10 mg by mouth daily.  . dorzolamide-timolol (COSOPT) 22.3-6.8 MG/ML ophthalmic solution Place 1 drop into both eyes 2 (two) times daily.   . DULoxetine (CYMBALTA) 20 MG capsule TAKE (1) CAPSULE DAILY.  Marland Kitchen Ferrous Gluconate (IRON 27 PO) Take 1 tablet by mouth daily.  . folic acid (FOLVITE) 811 MCG tablet Take 400 mcg by mouth daily.  . hydrocortisone (ANUSOL-HC) 2.5 % rectal cream Place rectally 2 (two) times daily.  Marland Kitchen latanoprost (XALATAN) 0.005 % ophthalmic solution Place 1 drop into both eyes at bedtime.  . Multiple Vitamins-Minerals (PRESERVISION AREDS 2 PO) Take 1 capsule by mouth 2 (two) times daily.   Marland Kitchen omeprazole (PRILOSEC) 20 MG capsule TAKE 1 CAPSULE  EVERY DAY.  . vitamin B-12 (CYANOCOBALAMIN) 1000 MCG tablet Take 1,000 mcg by mouth daily.   No facility-administered encounter medications on file as of 12/13/2017.     Review of Systems  Constitutional: Negative for activity change, appetite change, chills, diaphoresis, fatigue and fever.  HENT: Positive for hearing loss. Negative for congestion, trouble swallowing and voice change.   Eyes: Negative for visual disturbance.  Respiratory: Negative for cough, choking, chest tightness, shortness of breath and wheezing.   Cardiovascular: Negative for chest pain, palpitations and leg swelling.  Gastrointestinal: Negative for abdominal distention, abdominal pain, anal bleeding, blood in stool, constipation, diarrhea, nausea, rectal pain and vomiting.  Endocrine: Negative for cold intolerance.  Genitourinary: Positive for frequency. Negative for decreased urine volume, difficulty urinating and dysuria.  Musculoskeletal: Positive for gait problem. Negative for arthralgias, back pain and myalgias.  Skin: Negative for color change, pallor and rash.  Neurological: Negative for tremors, speech difficulty, weakness and headaches.  Psychiatric/Behavioral: Negative for agitation, behavioral problems, confusion, hallucinations and sleep disturbance. The patient is not nervous/anxious.     Vitals:   12/13/17 1048  BP: 140/70  Pulse: 72  Resp: 18  Temp: 98.5 F (36.9 C)  SpO2: 95%  Weight: 151 lb 12.8 oz (68.9 kg)  Height: 5' 6"  (1.676 m)   Body mass index is 24.5 kg/m. Physical Exam  Constitutional: She is oriented to person, place, and time. She appears well-developed and well-nourished. No distress.  HENT:  Head: Normocephalic and atraumatic.  Eyes: Conjunctivae and EOM are normal. Pupils are equal, round, and reactive to light. No scleral icterus.  Neck: Normal range of motion. Neck supple. No JVD present. No thyromegaly present.  Cardiovascular: Normal rate.  Frequent skips heart beats    Pulmonary/Chest: Effort normal and breath sounds normal. She has no wheezes. She has no rales.  Abdominal: Soft. Bowel sounds are normal. She exhibits no distension. There is no tenderness. There is no rebound and no guarding.  Musculoskeletal: Normal range of  motion. She exhibits no edema or tenderness.  Transfer self, ambulates with walker, wheelchair to go further.   Neurological: She is alert and oriented to person, place, and time. No cranial nerve deficit. She exhibits normal muscle tone. Coordination normal.  Skin: Skin is warm and dry. No rash noted. She is not diaphoretic. No erythema. No pallor.  Psychiatric: She has a normal mood and affect. Her behavior is normal. Judgment and thought content normal.    Labs reviewed: Basic Metabolic Panel: Recent Labs    10/09/17 0700  11/03/17 1818 11/03/17 1829 11/03/17 2039 11/05/17 0346 11/19/17 11/20/17 12/05/17  NA 134*  --  136 138 137 135 138 139 133*  K 3.8  --  3.7 3.6 3.7 3.9 3.9 3.7 4.0  CL 98  --  103 101 103 109  --   --   --   CO2 28  --  25  --   --  22  --   --   --   GLUCOSE 97  --  103* 102* 146* 119*  --   --   --   BUN 20  --  23* 23* 26* 34* 13 13 18   CREATININE 0.82   < > 0.89 0.90 0.90 0.84 0.9 0.7 0.7  CALCIUM 9.8  --  9.2  --   --  8.2*  --   --   --    < > = values in this interval not displayed.   Liver Function Tests: Recent Labs    07/24/17 0931 08/21/17 0000 11/03/17 1818 11/19/17 12/05/17  AST 17  17 16 20 18 23   ALT 11  11 14  13* 12 20  ALKPHOS 90  90  --  78 74 97  BILITOT 0.4  0.4 0.3 0.4  --   --   PROT 7.0  7.0 7.0 6.5  --   --   ALBUMIN 3.6  3.6  --  3.1*  --   --    No results for input(s): LIPASE, AMYLASE in the last 8760 hours. No results for input(s): AMMONIA in the last 8760 hours. CBC: Recent Labs    11/12/17 1934  11/14/17 0334  11/15/17 0357  11/20/17 11/28/17 12/05/17  WBC 10.8*  --  10.2  --  9.6   < > 7.9 8.6 7.2  HGB 8.3*   < > 7.6*   < > 9.7*   < > 7.3* 8.7* 8.9*   HCT 24.3*  --  22.9*   < > 29.9*   < > 23* 26* 26*  MCV 86.8  --  88.8  --  88.7  --   --   --   --   PLT 202  --  235  --  270   < > 293 319 404*   < > = values in this interval not displayed.   Cardiac Enzymes: No results for input(s): CKTOTAL, CKMB, CKMBINDEX, TROPONINI in the last 8760 hours. BNP: Invalid input(s): POCBNP CBG: Recent Labs    11/06/17 1122 11/06/17 1524 11/09/17 0732  GLUCAP 91 89 80    Procedures and Imaging Studies During Stay: No results found.  Assessment/Plan:   HTN (hypertension) Her blood pressures and heart rate is in control, continue Atenolol 12.25m bid.   Permanent atrial fibrillation (HCC) Heart rate is in control, continue Atenolol 12.529mbid.   Diverticulosis of colon with hemorrhage s/p 4 hemostatic clips. She was transfused several units of bleed, her Hgb was 9.7 upon hospital discharge.  She has regained physical strength and energy, able to function at [place] level. Her Hgb has been stabilized from 7.8 11/19/17 to 8.9 12/07/17, will continue Fe, Vit Y81, Folic acid, and Omeprazole. Will need to f/u GI and PCP in 2-4 weeks.   GERD Stable, continue Omeprazole 28m po daily.   Generalized weakness Improved, [facility] S/p diverticulosis with bleed, 4x hemastatic clips, several units of blood transfusion, continue Fe, Vit BK48 Folic acid, update CBC prior to the next appointment in clinic [facility]  Hyponatremia Last Na 133 12/07/17, monitor CMP  Depression with anxiety Her mood is stable, continue Cymbalta.   Protein-calorie malnutrition ([place] Continue dietary supplement, last total protein 5.7, albumin 2.7     Patient is being discharged with the following home health services:    Patient is being discharged with the following durable medical equipment:    Patient has been advised to f/u with their PCP in 1-2 weeks to for a transitions of care visit.  Social services at their facility was  responsible for arranging this appointment.  Pt was provided with adequate prescriptions of noncontrolled medications to reach the scheduled appointment .  For controlled substances, a limited supply was provided as appropriate for the individual patient.  If the pt normally receives these medications from a pain clinic or has a contract with another physician, these medications should be received from that clinic or physician only).    Future labs/tests needed:  Reschedule f/u appointment 1-2 weeks after GI f/u 12/25/16, obtain CBC/CMP [facility] [place] [facility]

## 2017-12-13 NOTE — Patient Instructions (Signed)
Reschedule f/u appointment 1-2 weeks after GI f/u 12/25/16, obtain CBC/CMP prior to appointment in clinic [place].

## 2017-12-13 NOTE — Assessment & Plan Note (Signed)
Continue dietary supplement, last total protein 5.7, albumin 2.7

## 2017-12-13 NOTE — Assessment & Plan Note (Signed)
Heart rate is in control, continue Atenolol 12.39m bid.

## 2017-12-13 NOTE — Assessment & Plan Note (Addendum)
Last Na 133 12/07/17, monitor CMP

## 2017-12-13 NOTE — Assessment & Plan Note (Signed)
Her blood pressures and heart rate is in control, continue Atenolol 12.11m bid.

## 2017-12-19 ENCOUNTER — Non-Acute Institutional Stay: Payer: Medicare HMO | Admitting: Nurse Practitioner

## 2017-12-19 ENCOUNTER — Encounter: Payer: Medicare HMO | Admitting: Nurse Practitioner

## 2017-12-19 ENCOUNTER — Encounter: Payer: Self-pay | Admitting: Nurse Practitioner

## 2017-12-19 DIAGNOSIS — I10 Essential (primary) hypertension: Secondary | ICD-10-CM | POA: Diagnosis not present

## 2017-12-19 DIAGNOSIS — D500 Iron deficiency anemia secondary to blood loss (chronic): Secondary | ICD-10-CM

## 2017-12-19 DIAGNOSIS — K5900 Constipation, unspecified: Secondary | ICD-10-CM | POA: Diagnosis not present

## 2017-12-19 DIAGNOSIS — G9331 Postviral fatigue syndrome: Secondary | ICD-10-CM

## 2017-12-19 DIAGNOSIS — G933 Postviral fatigue syndrome: Secondary | ICD-10-CM

## 2017-12-19 DIAGNOSIS — E871 Hypo-osmolality and hyponatremia: Secondary | ICD-10-CM | POA: Diagnosis not present

## 2017-12-19 NOTE — Assessment & Plan Note (Signed)
Appetite is reportedly poor, hx of Na 120s, last Na 133, update CMP

## 2017-12-19 NOTE — Assessment & Plan Note (Signed)
Its not new, may multiple factorials, s/p diverticulosis bleed, anemia, low sodium, re adjusting to [place]. Will update CBC and CMP

## 2017-12-19 NOTE — Progress Notes (Signed)
Location:   [facility]   Place of Service:   clinic  Provider: Marlana Latus NP  Code Status: DNR Goals of Care:  Advanced Directives 12/13/2017  Does Patient Have a Medical Advance Directive? Yes  Type of Advance Directive Living will;Out of facility DNR (pink MOST or yellow form);Healthcare Power of Attorney  Does patient want to make changes to medical advance directive? No - Patient declined  Copy of [place] in Chart? Yes  Would patient like information on creating a medical advance directive? No - Patient declined  Pre-existing out of facility DNR order (yellow form or pink MOST form) Pink MOST form placed in chart (order not valid for inpatient use)     Chief Complaint  Patient presents with  . Acute Visit    fatigue    HPI: Patient is a 84 y.o. female seen today for an acute visit for fatigue, follow up anemia, wants to know if she is taking adequate amount of iron supplement, last Hgb 8.9 12/05/17, c/o constipation, failed diet control, she also wants to take Atenolol 42m daily instead of 12.558mbid po for HTN. Hx of hyponatremia, last Na 133 12/05/17, anxious about her current sodium level since her appetite is not optimal good.   Past Medical History:  Diagnosis Date  . Allergic rhinitis   . Anemia    NOS  . Anxiety   . Biceps tendon rupture    Bilateral  . Breast cancer ([facility]   Left s/p lumpectomy and XRT   . [place]chiectasis ([facility]   Noted on chest x-ray  . Complication of anesthesia    slow to wake up  . Cough 2014  . Depression   . Diverticulosis   . Elevated liver enzymes    Biopsy consistent with steatohepatitis  . Fundic gland polyps of stomach, benign   . GERD (gastroesophageal reflux disease) 06/2007   EGD Dr [facility]ala Romneys[facility], multiple fundic gland polyps  . Glaucoma 2013   both eyes  . Hemorrhoids   . Hiatal hernia   . Hx of radiation therapy 07/17/10 to 07/21/10   SRS LLL lung  . Hyperlipidemia   . Insomnia   . Low back pain      scoliosis  . [facility] [place]   Left 2011 - rad x 3  . Lymphedema    Left arm  . Osteoarthritis   . Osteopenia 10/25/2016  . Osteoporosis, senile   . Overactive bladder   . Pneumonia    RLL with sepsis  . PSVT (paroxysmal supraventricular tachycardia) ([facility]  . Rheumatic fever    Age 89. Right thyroid nodule   . Steatohepatitis    liver bx  . Type 2 diabetes mellitus ([facility]    Past Surgical History:  Procedure Laterality Date  . ABDOMINAL HYSTERECTOMY  1964  . APPENDECTOMY  1964  . BIOPSY THYROID  11/2008  . BREAST BIOPSY     X 3 w/ cystectomy  . BREAST LUMPECTOMY  2001  . CATARACT EXTRACTION Bilateral 2003   Lens implant Dr. EpCharise Killian. CHOLECYSTECTOMY  1965  . COLONOSCOPY  09/11/2011   Procedure: COLONOSCOPY;  Surgeon: SaDorothyann PengMD;  [facility];  Service: Endoscopy;  Laterality: N/A;  . COLONOSCOPY  2005 /2012   Dr. ReLucianne Mussdiverticulosis, ext hemorrhoids.  . COLONOSCOPY WITH PROPOFOL N/A 11/09/2017   Procedure: COLONOSCOPY WITH PROPOFOL;  Surgeon: NaMauri PoleMD;  [facility];  Service: Endoscopy;  Laterality: N/A;  . COLONOSCOPY WITH PROPOFOL N/A 11/12/2017   Procedure: COLONOSCOPY WITH PROPOFOL;  Surgeon: Ladene Artist, MD;  [facility];  Service: Endoscopy;  Laterality: N/A;  . CYSTOSCOPY  2007  . ESOPHAGOGASTRODUODENOSCOPY  09/11/2011   Procedure: ESOPHAGOGASTRODUODENOSCOPY (EGD);  Surgeon: Dorothyann Peng, MD;  [facility];  Service: Endoscopy;  Laterality: N/A;  . ESOPHAGOGASTRODUODENOSCOPY (EGD) WITH PROPOFOL N/A 11/08/2017   Procedure: ESOPHAGOGASTRODUODENOSCOPY (EGD) WITH PROPOFOL;  Surgeon: Mauri Pole, MD;  [facility];  Service: Endoscopy;  Laterality: N/A;  . LIVER BIOPSY  2008  . LUNG BIOPSY Left 06/06/2010  . REVISION TOTAL HIP ARTHROPLASTY  2007   Left  . SKIN CANCER EXCISION     nose and left lower cheek  . TONSILLECTOMY  1930  . TOTAL HIP ARTHROPLASTY  Bilateral 2005 & 2007    Dr. Earl Lagos. Aline Brochure     Allergies  Allergen Reactions  . Augmentin [Amoxicillin-Pot Clavulanate]   . Ibuprofen Hives  . Naproxen   . Statins Other (See Comments)    Feels like knives in stomach  . Surgilube [Gyne-Moistrin]     Rectal itching, burning  . Tape Rash    Allergies as of 12/19/2017      Reactions   Augmentin [amoxicillin-pot Clavulanate]    Ibuprofen Hives   Naproxen    Statins Other (See Comments)   Feels like knives in stomach   Surgilube [gyne-moistrin]    Rectal itching, burning   Tape Rash      Medication List        Accurate as of 12/19/17  5:18 PM. Always use your most recent med list.          acetaminophen 325 MG tablet Commonly known as:  TYLENOL Take 650 mg by mouth every 6 (six) hours as needed.   atenolol 25 MG tablet Commonly known as:  TENORMIN Take 0.5 tablets (12.5 mg total) by mouth 2 (two) times daily.   cetirizine 10 MG tablet Commonly known as:  ZYRTEC Take 10 mg by mouth daily.   dorzolamide-timolol 22.3-6.8 MG/ML ophthalmic solution Commonly known as:  COSOPT Place 1 drop into both eyes 2 (two) times daily.   DULoxetine 20 MG capsule Commonly known as:  CYMBALTA TAKE (1) CAPSULE DAILY.   folic acid 867 MCG tablet Commonly known as:  FOLVITE Take 400 mcg by mouth daily.   hydrocortisone 2.5 % rectal cream Commonly known as:  ANUSOL-HC Place rectally 2 (two) times daily.   IRON 27 PO Take 1 tablet by mouth daily.   latanoprost 0.005 % ophthalmic solution Commonly known as:  XALATAN Place 1 drop into both eyes at bedtime.   omeprazole 20 MG capsule Commonly known as:  PRILOSEC TAKE 1 CAPSULE EVERY DAY.   PRESERVISION AREDS 2 PO Take 1 capsule by mouth 2 (two) times daily.   vitamin B-12 1000 MCG tablet Commonly known as:  CYANOCOBALAMIN Take 1,000 mcg by mouth daily.       Review of Systems:  Review of Systems  Constitutional: Positive for appetite change and fatigue.  Negative for activity change, chills, diaphoresis and fever.  HENT: Positive for hearing loss. Negative for congestion, trouble swallowing and voice change.   Eyes: Negative for visual disturbance.  Respiratory: Negative for cough, choking, chest tightness, shortness of breath and wheezing.   Cardiovascular: Negative for chest pain, palpitations and leg swelling.  Gastrointestinal: Positive for constipation. Negative for abdominal distention, abdominal pain, anal bleeding, blood in  stool, diarrhea, nausea and vomiting.  Endocrine: Negative for cold intolerance.  Genitourinary: Negative for difficulty urinating, dysuria, frequency and urgency.  Musculoskeletal: Positive for gait problem.  Skin: Negative for color change, pallor and rash.  Neurological: Negative for tremors, speech difficulty, weakness and headaches.  Psychiatric/Behavioral: Negative for agitation, behavioral problems, confusion, hallucinations and sleep disturbance. The patient is not nervous/anxious.     Health Maintenance  Topic Date Due  . FOOT EXAM  07/19/1931  . OPHTHALMOLOGY EXAM  07/19/1931  . PNA vac Low Risk Adult (2 of 2 - PCV13) 08/18/2007  . URINE MICROALBUMIN  04/30/2008  . TETANUS/TDAP  12/18/2015  . HEMOGLOBIN A1C  10/17/2017  . INFLUENZA VACCINE  Completed  . DEXA SCAN  Completed    Physical Exam: Vitals:   12/19/17 1504  BP: 130/80  Pulse: 89  Resp: 20  Temp: 98.3 F (36.8 C)  SpO2: 95%  Weight: 149 lb 12.8 oz (67.9 kg)  Height: 5' 6"  (1.676 m)   Body mass index is 24.18 kg/m. Physical Exam  Constitutional: She is oriented to person, place, and time. She appears well-developed and well-nourished. No distress.  HENT:  Head: Normocephalic and atraumatic.  Mouth/Throat: No oropharyngeal exudate.  Torus palatinus.   Eyes: Conjunctivae and EOM are normal. Pupils are equal, round, and reactive to light.  Neck: Normal range of motion. Neck supple. No JVD present. No thyromegaly present.   Cardiovascular: Normal rate, regular rhythm and normal heart sounds.  No murmur heard. Pulmonary/Chest: Effort normal and breath sounds normal. She has no wheezes. She has no rales.  Abdominal: Soft. Bowel sounds are normal. She exhibits no distension. There is no tenderness. There is no rebound and no guarding.  Musculoskeletal: Normal range of motion. She exhibits no edema or tenderness.  Ambulates with walker for short distance, w/c to go further.   Neurological: She is alert and oriented to person, place, and time. She exhibits normal muscle tone. Coordination normal.  Skin: Skin is warm and dry. No rash noted. She is not diaphoretic. No erythema.  Psychiatric: She has a normal mood and affect. Her behavior is normal. Judgment and thought content normal.    Labs reviewed: Basic Metabolic Panel: Recent Labs    04/16/17  10/09/17 0700  11/03/17 1818 11/03/17 1829 11/03/17 2039 11/05/17 0346 11/19/17 11/20/17 12/05/17  NA 129*   < > 134*  --  136 138 137 135 138 139 133*  K 4.8   < > 3.8  --  3.7 3.6 3.7 3.9 3.9 3.7 4.0  CL  --    < > 98  --  103 101 103 109  --   --   --   CO2  --    < > 28  --  25  --   --  22  --   --   --   GLUCOSE  --    < > 97  --  103* 102* 146* 119*  --   --   --   BUN 12   < > 20  --  23* 23* 26* 34* 13 13 18   CREATININE 0.8   < > 0.82   < > 0.89 0.90 0.90 0.84 0.9 0.7 0.7  CALCIUM  --    < > 9.8  --  9.2  --   --  8.2*  --   --   --   TSH 33.00*  --   --   --   --   --   --   --   --  2.00  --    < > = values in this interval not displayed.   Liver Function Tests: Recent Labs    07/24/17 0931 08/21/17 0000 11/03/17 1818 11/19/17 12/05/17  AST 17  17 16 20 18 23   ALT 11  11 14  13* 12 20  ALKPHOS 90  90  --  78 74 97  BILITOT 0.4  0.4 0.3 0.4  --   --   PROT 7.0  7.0 7.0 6.5  --   --   ALBUMIN 3.6  3.6  --  3.1*  --   --    No results for input(s): LIPASE, AMYLASE in the last 8760 hours. No results for input(s): AMMONIA in the last 8760  hours. CBC: Recent Labs    11/12/17 1934  11/14/17 0334  11/15/17 0357  11/20/17 11/28/17 12/05/17  WBC 10.8*  --  10.2  --  9.6   < > 7.9 8.6 7.2  HGB 8.3*   < > 7.6*   < > 9.7*   < > 7.3* 8.7* 8.9*  HCT 24.3*  --  22.9*   < > 29.9*   < > 23* 26* 26*  MCV 86.8  --  88.8  --  88.7  --   --   --   --   PLT 202  --  235  --  270   < > 293 319 404*   < > = values in this interval not displayed.   Lipid Panel: No results for input(s): CHOL, HDL, LDLCALC, TRIG, CHOLHDL, LDLDIRECT in the last 8760 hours. Lab Results  Component Value Date   HGBA1C 5.3 04/16/2017    Procedures since last visit: No results found.  Assessment/Plan Blood loss anemia No blood noted in stools, continue Fe, Vit 12, Folic acid. Update CBC, Iron level, ferritin  Hyponatremia Appetite is reportedly poor, hx of Na 120s, last Na 133, update CMP  Fatigue Its not new, may multiple factorials, s/p diverticulosis bleed, anemia, low sodium, re adjusting to IL. Will update CBC and CMP  HTN (hypertension) Will try Atenolol 30m daily, monitor Bp and heart rate  Constipation The patient was instructed to take MiraLax prn daily OTC, observe.     Labs/tests ordered: CBC CMP iron ferritin  Next appt:  01/09/2018  Time spend 25 minutes, the patient's daughter in law present during the visit.

## 2017-12-19 NOTE — Assessment & Plan Note (Signed)
No blood noted in stools, continue Fe, Vit 12, Folic acid. Update CBC, Iron level, ferritin

## 2017-12-19 NOTE — Patient Instructions (Signed)
CBC, Ferritin, iron level, CMP next 12/24/17. OTC MiraLax qd prn for constipation. May take Atenolol 42m qd instead of 12.517mpo bid. Monitor BP and P.

## 2017-12-19 NOTE — Assessment & Plan Note (Signed)
The patient was instructed to take MiraLax prn daily OTC, observe.

## 2017-12-19 NOTE — Assessment & Plan Note (Signed)
Will try Atenolol 44m daily, monitor Bp and heart rate

## 2017-12-23 ENCOUNTER — Other Ambulatory Visit: Payer: Self-pay | Admitting: *Deleted

## 2017-12-23 ENCOUNTER — Other Ambulatory Visit: Payer: Self-pay

## 2017-12-23 DIAGNOSIS — D500 Iron deficiency anemia secondary to blood loss (chronic): Secondary | ICD-10-CM

## 2017-12-24 ENCOUNTER — Other Ambulatory Visit: Payer: Self-pay

## 2017-12-24 DIAGNOSIS — D500 Iron deficiency anemia secondary to blood loss (chronic): Secondary | ICD-10-CM | POA: Diagnosis not present

## 2017-12-24 LAB — HEPATIC FUNCTION PANEL
AG Ratio: 0.9 (calc) — ABNORMAL LOW (ref 1.0–2.5)
ALKALINE PHOSPHATASE (APISO): 111 U/L (ref 33–130)
ALT: 12 U/L (ref 6–29)
AST: 17 U/L (ref 10–35)
Albumin: 3.5 g/dL — ABNORMAL LOW (ref 3.6–5.1)
BILIRUBIN INDIRECT: 0.3 mg/dL (ref 0.2–1.2)
Bilirubin, Direct: 0.1 mg/dL (ref 0.0–0.2)
Globulin: 3.7 g/dL (calc) (ref 1.9–3.7)
TOTAL PROTEIN: 7.2 g/dL (ref 6.1–8.1)
Total Bilirubin: 0.4 mg/dL (ref 0.2–1.2)

## 2017-12-24 LAB — CBC
HCT: 31.9 % — ABNORMAL LOW (ref 35.0–45.0)
Hemoglobin: 10.7 g/dL — ABNORMAL LOW (ref 11.7–15.5)
MCH: 29 pg (ref 27.0–33.0)
MCHC: 33.5 g/dL (ref 32.0–36.0)
MCV: 86.4 fL (ref 80.0–100.0)
MPV: 10.7 fL (ref 7.5–12.5)
PLATELETS: 389 10*3/uL (ref 140–400)
RBC: 3.69 10*6/uL — ABNORMAL LOW (ref 3.80–5.10)
RDW: 17.2 % — AB (ref 11.0–15.0)
WBC: 6.5 10*3/uL (ref 3.8–10.8)

## 2017-12-24 LAB — COMPREHENSIVE METABOLIC PANEL
AG Ratio: 0.9 (calc) — ABNORMAL LOW (ref 1.0–2.5)
ALT: 12 U/L (ref 6–29)
AST: 17 U/L (ref 10–35)
Albumin: 3.5 g/dL — ABNORMAL LOW (ref 3.6–5.1)
Alkaline phosphatase (APISO): 111 U/L (ref 33–130)
BILIRUBIN TOTAL: 0.4 mg/dL (ref 0.2–1.2)
BUN: 15 mg/dL (ref 7–25)
CALCIUM: 10 mg/dL (ref 8.6–10.4)
CO2: 31 mmol/L (ref 20–32)
CREATININE: 0.78 mg/dL (ref 0.60–0.88)
Chloride: 97 mmol/L — ABNORMAL LOW (ref 98–110)
Globulin: 3.7 g/dL (calc) (ref 1.9–3.7)
Glucose, Bld: 79 mg/dL (ref 65–99)
Potassium: 3.9 mmol/L (ref 3.5–5.3)
SODIUM: 135 mmol/L (ref 135–146)
TOTAL PROTEIN: 7.2 g/dL (ref 6.1–8.1)

## 2017-12-24 LAB — IRON: IRON: 36 ug/dL — AB (ref 45–160)

## 2017-12-24 LAB — FERRITIN: FERRITIN: 251 ng/mL (ref 20–288)

## 2017-12-25 ENCOUNTER — Encounter: Payer: Self-pay | Admitting: Gastroenterology

## 2017-12-25 ENCOUNTER — Ambulatory Visit: Payer: Medicare HMO | Admitting: Gastroenterology

## 2017-12-25 VITALS — BP 124/68 | HR 64 | Wt 150.1 lb

## 2017-12-25 DIAGNOSIS — K5900 Constipation, unspecified: Secondary | ICD-10-CM | POA: Diagnosis not present

## 2017-12-25 DIAGNOSIS — Z8719 Personal history of other diseases of the digestive system: Secondary | ICD-10-CM | POA: Diagnosis not present

## 2017-12-25 NOTE — Patient Instructions (Signed)
Start Miralax 1/2 dose daily. This can be found over the counter at your local pharmacy.

## 2017-12-25 NOTE — Progress Notes (Signed)
12/25/2017 [name] [id number] [birth year]   HISTORY OF PRESENT ILLNESS:  This is a pleasant 84 year old female who is here for hospital follow-up.  She is actually a patient of Dr. Oneida Alar, but she was seen by our practice during a hospitalization in November for diverticular bleeding.  She had a significant bleed, which required 7 or 8 units of blood.  Had a diverticular bleed in 2012 that was not as significant.  Anyway, she underwent 2 colonoscopies in November with hemoclips placed at a diverticulum in the transverse colon.  Bleeding stopped and she was eventually discharged to a nursing home/rehab facility.  She is here at the request of her PCP, Dr. Bubba Camp, just to check in and make sure all was well.  Hgb yesterday was the highest that it has really ever been at 10.7 grams.  Her only complaint is some constipation.  She says that she often has hard stool with only a BM every 2 or 3 days.  Says that she tried Miralax years ago and had a lot of diarrhea with it.   Past Medical History:  Diagnosis Date  . Allergic rhinitis   . Anemia    NOS  . Anxiety   . Biceps tendon rupture    Bilateral  . Breast cancer ([place]) 2001   Left s/p lumpectomy and XRT   . Bronchiectasis ([place]) 2014   Noted on chest x-ray  . Complication of anesthesia    slow to wake up  . Cough 2014  . Depression   . Diverticulosis   . Elevated liver enzymes    Biopsy consistent with steatohepatitis  . Fundic gland polyps of stomach, benign   . GERD (gastroesophageal reflux disease) 06/2007   EGD Dr Gala Romney >[facility], multiple fundic gland polyps  . Glaucoma 2013   both eyes  . Hemorrhoids   . Hiatal hernia   . Hx of radiation therapy 07/17/10 to 07/21/10   SRS LLL lung  . Hyperlipidemia   . Insomnia   . Low back pain    scoliosis  . Lung cancer ([place]) 05/2010   Left 2011 - rad x 3  . Lymphedema    Left arm  . Osteoarthritis   . Osteopenia 10/25/2016  . Osteoporosis, senile   . Overactive bladder   . Pneumonia     RLL with sepsis  . PSVT (paroxysmal supraventricular tachycardia) ([place])   . Rheumatic fever    Age 83  . Right thyroid nodule   . Steatohepatitis    liver bx  . Type 2 diabetes mellitus ([place])    Past Surgical History:  Procedure Laterality Date  . ABDOMINAL HYSTERECTOMY  1964  . APPENDECTOMY  1964  . BIOPSY THYROID  11/2008  . BREAST BIOPSY     X 3 w/ cystectomy  . BREAST LUMPECTOMY  2001  . CATARACT EXTRACTION Bilateral 2003   Lens implant Dr. Charise Killian  . CHOLECYSTECTOMY  1965  . COLONOSCOPY  09/11/2011   Procedure: COLONOSCOPY;  Surgeon: Dorothyann Peng, MD;  [facility];  Service: Endoscopy;  Laterality: N/A;  . COLONOSCOPY  2005 /2012   Dr. Lucianne Muss- diverticulosis, ext hemorrhoids.  . COLONOSCOPY WITH PROPOFOL N/A 11/09/2017   Procedure: COLONOSCOPY WITH PROPOFOL;  Surgeon: Mauri Pole, MD;  [facility];  Service: Endoscopy;  Laterality: N/A;  . COLONOSCOPY WITH PROPOFOL N/A 11/12/2017   Procedure: COLONOSCOPY WITH PROPOFOL;  Surgeon: Ladene Artist, MD;  [facility];  Service: Endoscopy;  Laterality: N/A;  . CYSTOSCOPY  2007  . ESOPHAGOGASTRODUODENOSCOPY  09/11/2011   Procedure: ESOPHAGOGASTRODUODENOSCOPY (EGD);  Surgeon: Dorothyann Peng, MD;  [facility];  Service: Endoscopy;  Laterality: N/A;  . ESOPHAGOGASTRODUODENOSCOPY (EGD) WITH PROPOFOL N/A 11/08/2017   Procedure: ESOPHAGOGASTRODUODENOSCOPY (EGD) WITH PROPOFOL;  Surgeon: Mauri Pole, MD;  [facility];  Service: Endoscopy;  Laterality: N/A;  . LIVER BIOPSY  2008  . LUNG BIOPSY Left 06/06/2010  . REVISION TOTAL HIP ARTHROPLASTY  2007   Left  . SKIN CANCER EXCISION     nose and left lower cheek  . TONSILLECTOMY  1930  . TOTAL HIP ARTHROPLASTY Bilateral 2005 & 2007    Dr. Earl Lagos. Aline Brochure     reports that  has never smoked. she has never used smokeless tobacco. She reports that she does not drink alcohol or use drugs. family history includes  Cancer in her maternal grandmother and unknown relative; Heart failure in her brother, father, and mother; Hypertension in her father; Leukemia in her brother; Osteoporosis in her mother; Stroke in her father and maternal grandfather. Allergies  Allergen Reactions  . Augmentin [Amoxicillin-Pot Clavulanate]   . Ibuprofen Hives  . Naproxen   . Statins Other (See Comments)    Feels like knives in stomach  . Surgilube [Gyne-Moistrin]     Rectal itching, burning  . Tape Rash      Outpatient Encounter Medications as of 12/25/2017  Medication Sig  . acetaminophen (TYLENOL) 325 MG tablet Take 650 mg by mouth every 6 (six) hours as needed.   Marland Kitchen atenolol (TENORMIN) 25 MG tablet Take 0.5 tablets (12.5 mg total) by mouth 2 (two) times daily.  . cetirizine (ZYRTEC) 10 MG tablet Take 10 mg by mouth daily.  . dorzolamide-timolol (COSOPT) 22.3-6.8 MG/ML ophthalmic solution Place 1 drop into both eyes 2 (two) times daily.   . DULoxetine (CYMBALTA) 20 MG capsule TAKE (1) CAPSULE DAILY.  Marland Kitchen Ferrous Gluconate (IRON 27 PO) Take 1 tablet by mouth daily.  . folic acid (FOLVITE) 275 MCG tablet Take 400 mcg by mouth daily.  Marland Kitchen latanoprost (XALATAN) 0.005 % ophthalmic solution Place 1 drop into both eyes at bedtime.  . Multiple Vitamins-Minerals (PRESERVISION AREDS 2 PO) Take 1 capsule by mouth 2 (two) times daily.   Marland Kitchen omeprazole (PRILOSEC) 20 MG capsule TAKE 1 CAPSULE EVERY DAY.  . vitamin B-12 (CYANOCOBALAMIN) 1000 MCG tablet Take 1,000 mcg by mouth daily.  . [DISCONTINUED] hydrocortisone (ANUSOL-HC) 2.5 % rectal cream Place rectally 2 (two) times daily.   No facility-administered encounter medications on file as of 12/25/2017.      REVIEW OF SYSTEMS  : All other systems reviewed and negative except where noted in the History of Present Illness.   PHYSICAL EXAM: BP 124/68   Pulse 64   Wt 150 lb 2 oz (68.1 kg)   BMI 24.23 kg/m  General: Well developed white female in no acute distress Head: Normocephalic  and atraumatic Eyes:  Sclerae anicteric, conjunctiva pink. Ears: Normal auditory acuity Lungs: Clear throughout to auscultation; no increased WOB. Heart: Regular rate and rhythm; no M/R/G. Abdomen: Soft, non-distended.  BS present.  Non-tender. Musculoskeletal: Symmetrical with no gross deformities  Skin: No lesions on visible extremities Extremities: No edema  Neurological: Alert oriented x 4, grossly non-focal Psychological:  Alert and cooperative. Normal mood and affect  ASSESSMENT AND PLAN: *GI bleeding due to diverticulosis:  Was hospitalized for this in November and received 7 or 8 units of PRBC's.  Had colonoscopy x 2 with hemoclips placed at a diverticulum in the transverse colon.  No further bleeding since that time.  Hgb yesterday much improved. *Constipation:  Having hard stools.  Is skeptical to try Miralax as she had issues with diarrhea with it in the past.  Will start with 1/2 dose of Miralax daily and can increase if needed.  **Otherwise doing well and follow-up as needed.   CC:  Blanchie Serve, MD

## 2017-12-27 ENCOUNTER — Other Ambulatory Visit: Payer: Self-pay | Admitting: *Deleted

## 2017-12-27 DIAGNOSIS — R790 Abnormal level of blood mineral: Secondary | ICD-10-CM

## 2017-12-30 NOTE — Progress Notes (Signed)
Reviewed and agree with documentation and assessment and plan. K. Veena Daisee Centner , MD   

## 2018-01-09 ENCOUNTER — Encounter: Payer: Self-pay | Admitting: Nurse Practitioner

## 2018-01-09 ENCOUNTER — Non-Acute Institutional Stay: Payer: Medicare HMO | Admitting: Nurse Practitioner

## 2018-01-09 DIAGNOSIS — M533 Sacrococcygeal disorders, not elsewhere classified: Secondary | ICD-10-CM | POA: Insufficient documentation

## 2018-01-09 DIAGNOSIS — D500 Iron deficiency anemia secondary to blood loss (chronic): Secondary | ICD-10-CM | POA: Diagnosis not present

## 2018-01-09 DIAGNOSIS — I10 Essential (primary) hypertension: Secondary | ICD-10-CM | POA: Diagnosis not present

## 2018-01-09 DIAGNOSIS — W19XXXA Unspecified fall, initial encounter: Secondary | ICD-10-CM | POA: Insufficient documentation

## 2018-01-09 DIAGNOSIS — D62 Acute posthemorrhagic anemia: Secondary | ICD-10-CM

## 2018-01-09 DIAGNOSIS — E871 Hypo-osmolality and hyponatremia: Secondary | ICD-10-CM | POA: Diagnosis not present

## 2018-01-09 NOTE — Assessment & Plan Note (Signed)
Hx acute blood loss anemia, she stated she always taking Vit B12 and Folate, Fe was 36 12/23/17, lower end of normal, her Iron is increased  bid/qd, her Hgb 10.7 12/23/17, no s/s of GI bleed, denied stomach pain, indigestion, or nausea/vomiting. Will F/u CBC, Fe, Vit B12, continue Vti B12 and Folate for now.

## 2018-01-09 NOTE — Assessment & Plan Note (Signed)
Her blood pressure and HR is controlled, continue Atenolol 68m po daily.

## 2018-01-09 NOTE — Assessment & Plan Note (Signed)
History of hyponatremia, Na 135 12/23/17. She stated her weights are stable, she oral intake is at her baseline even if her appetite remains poor. Update BMP

## 2018-01-09 NOTE — Progress Notes (Signed)
Location:   Clinic [facility]  Place of Service:  Clinic (12) Provider: Marlana Latus NP  Code Status: DNR Goals of Care: [place] 12/13/2017  Does Patient Have a Medical Advance Directive? Yes  Type of Advance Directive Living will;Out of facility DNR (pink MOST or yellow form);Healthcare Power of Attorney  Does patient want to make changes to medical advance directive? No - Patient declined  Copy of [place] in Chart? Yes  Would patient like information on creating a medical advance directive? No - Patient declined  Pre-existing out of facility DNR order (yellow form or pink MOST form) Pink MOST form placed in chart (order not valid for inpatient use)     Chief Complaint  Patient presents with  . Medical Management of Chronic Issues    2 month follow up. Patient stated that she had a fall Tuesday and fell her bottom. Patient stated that she is a little sore.   . Medication Refill    No refills needed at this time  . Results    Discuss labs     HPI: Patient is a 84 y.o. female seen today for an acute visit for a mechanical  fall 12/2217 at home when she lost balance during turning,  some soreness in tail bone, Tylenol prn is adequate.   Hx Fe was 36 12/23/17, lower end of normal, her Iron should be bid/qd, her Hgb 10.7 12/23/17, no s/s of GI bleed, denied stomach pain, indigestion, or nausea/vomiting.   History of hyponatremia, Na 135 12/23/17. She stated her weights are stable, she oral intake is at her baseline even if her appetite remains poor.   Her blood pressure and HR is controlled on Atenolol 47m po daily.   Past Medical History:  Diagnosis Date  . Allergic rhinitis   . Anemia    NOS  . Anxiety   . Biceps tendon rupture    Bilateral  . Breast cancer ([facility] 2001   Left s/p lumpectomy and XRT   . Bronchiectasis ([facility] 2014   Noted on chest x-ray  . Complication of anesthesia    slow to wake up  . Cough 2014  . Depression   . Diverticulosis     . Elevated liver enzymes    Biopsy consistent with steatohepatitis  . Fundic gland polyps of stomach, benign   . GERD (gastroesophageal reflux disease) 06/2007   EGD Dr RGala Romney>sm HH, multiple fundic gland polyps  . Glaucoma 2013   both eyes  . Hemorrhoids   . Hiatal hernia   . Hx of radiation therapy 07/17/10 to 07/21/10   SRS LLL lung  . Hyperlipidemia   . Insomnia   . Low back pain    scoliosis  . Lung cancer ([facility] 05/2010   Left 2011 - rad x 3  . Lymphedema    Left arm  . Osteoarthritis   . Osteopenia 10/25/2016  . Osteoporosis, senile   . Overactive bladder   . Pneumonia    RLL with sepsis  . PSVT (paroxysmal supraventricular tachycardia) ([facility]   . Rheumatic fever    Age 84 . Right thyroid nodule   . Steatohepatitis    liver bx  . Type 2 diabetes mellitus ([facility]     Past Surgical History:  Procedure Laterality Date  . ABDOMINAL HYSTERECTOMY  1964  . APPENDECTOMY  1964  . BIOPSY THYROID  11/2008  . BREAST BIOPSY     X 3 w/ cystectomy  . BREAST  LUMPECTOMY  2001  . CATARACT EXTRACTION Bilateral 2003   Lens implant Dr. Charise Killian  . CHOLECYSTECTOMY  1965  . COLONOSCOPY  09/11/2011   Procedure: COLONOSCOPY;  Surgeon: Dorothyann Peng, MD;  [facility];  Service: Endoscopy;  Laterality: N/A;  . COLONOSCOPY  2005 /2012   Dr. Lucianne Muss- diverticulosis, ext hemorrhoids.  . COLONOSCOPY WITH PROPOFOL N/A 11/09/2017   Procedure: COLONOSCOPY WITH PROPOFOL;  Surgeon: Mauri Pole, MD;  Location: [facility];  Service: Endoscopy;  Laterality: N/A;  . COLONOSCOPY WITH PROPOFOL N/A 11/12/2017   Procedure: COLONOSCOPY WITH PROPOFOL;  Surgeon: Ladene Artist, MD;  Location: [facility];  Service: Endoscopy;  Laterality: N/A;  . CYSTOSCOPY  2007  . ESOPHAGOGASTRODUODENOSCOPY  09/11/2011   Procedure: ESOPHAGOGASTRODUODENOSCOPY (EGD);  Surgeon: Dorothyann Peng, MD;  [facility];  Service: Endoscopy;  Laterality: N/A;  . ESOPHAGOGASTRODUODENOSCOPY (EGD) WITH  PROPOFOL N/A 11/08/2017   Procedure: ESOPHAGOGASTRODUODENOSCOPY (EGD) WITH PROPOFOL;  Surgeon: Mauri Pole, MD;  Location: [facility];  Service: Endoscopy;  Laterality: N/A;  . LIVER BIOPSY  2008  . LUNG BIOPSY Left 06/06/2010  . REVISION TOTAL HIP ARTHROPLASTY  2007   Left  . SKIN CANCER EXCISION     nose and left lower cheek  . TONSILLECTOMY  1930  . TOTAL HIP ARTHROPLASTY Bilateral 2005 & 2007    Dr. Earl Lagos. Aline Brochure     Allergies  Allergen Reactions  . Augmentin [Amoxicillin-Pot Clavulanate]   . Ibuprofen Hives  . Naproxen   . Statins Other (See Comments)    Feels like knives in stomach  . Surgilube [Gyne-Moistrin]     Rectal itching, burning  . Tape Rash    Allergies as of 01/09/2018      Reactions   Augmentin [amoxicillin-pot Clavulanate]    Ibuprofen Hives   Naproxen    Statins Other (See Comments)   Feels like knives in stomach   Surgilube [gyne-moistrin]    Rectal itching, burning   Tape Rash      Medication List        Accurate as of 01/09/18 11:59 PM. Always use your most recent med list.          acetaminophen 325 MG tablet Commonly known as:  TYLENOL Take 650 mg by mouth every 6 (six) hours as needed.   atenolol 25 MG tablet Commonly known as:  TENORMIN Take 25 mg by mouth daily.   cetirizine 10 MG tablet Commonly known as:  ZYRTEC Take 10 mg by mouth daily.   dorzolamide-timolol 22.3-6.8 MG/ML ophthalmic solution Commonly known as:  COSOPT Place 1 drop into both eyes 2 (two) times daily.   DULoxetine 20 MG capsule Commonly known as:  CYMBALTA TAKE (1) CAPSULE DAILY.   folic acid 161 MCG tablet Commonly known as:  FOLVITE Take 400 mcg by mouth daily.   IRON 27 PO Take 1 tablet by mouth 2 (two) times daily.   latanoprost 0.005 % ophthalmic solution Commonly known as:  XALATAN Place 1 drop into both eyes at bedtime.   omeprazole 20 MG capsule Commonly known as:  PRILOSEC TAKE 1 CAPSULE EVERY DAY.   PRESERVISION  AREDS 2 PO Take 1 capsule by mouth 2 (two) times daily.   vitamin B-12 1000 MCG tablet Commonly known as:  CYANOCOBALAMIN Take 1,000 mcg by mouth daily.       Review of Systems:  Review of Systems  Constitutional: Negative for activity change, appetite change, chills, diaphoresis, fatigue and unexpected weight change.  HENT: Positive for hearing loss. Negative for congestion, trouble swallowing and voice change.   Eyes: Negative for visual disturbance.       Corrective glasses  Respiratory: Negative for cough, chest tightness, shortness of breath and wheezing.   Cardiovascular: Negative for chest pain, palpitations and leg swelling.  Gastrointestinal: Negative for abdominal distention, abdominal pain, blood in stool, constipation, diarrhea, nausea and vomiting.  Endocrine: Negative for cold intolerance.  Genitourinary: Negative for difficulty urinating, dysuria, frequency and urgency.  Musculoskeletal: Positive for back pain and gait problem.       [place] 01/07/18  Skin: Negative for color change, pallor, rash and wound.  Neurological: Negative for dizziness, tremors, syncope, facial asymmetry, speech difficulty, weakness, light-headedness, numbness and headaches.  Psychiatric/Behavioral: Negative for agitation, behavioral problems, confusion, hallucinations and sleep disturbance. The patient is not nervous/anxious.     Health Maintenance  Topic Date Due  . FOOT EXAM  07/19/1931  . OPHTHALMOLOGY EXAM  07/19/1931  . PNA vac Low Risk Adult (2 of 2 - PCV13) 08/18/2007  . URINE MICROALBUMIN  04/30/2008  . TETANUS/TDAP  12/18/2015  . HEMOGLOBIN A1C  10/17/2017  . INFLUENZA VACCINE  Completed  . DEXA SCAN  Completed    Physical Exam: Vitals:   01/09/18 1326  BP: 126/66  Pulse: 71  Resp: 16  Temp: 98.5 F (36.9 C)  TempSrc: Oral  SpO2: 96%  Weight: 155 lb 9.6 oz (70.6 kg)  Height: 5' 6"  (1.676 m)   Body mass index is 25.11 kg/m. Physical Exam  Constitutional: She is  oriented to person, place, and time. She appears well-developed and well-nourished. No distress.  HENT:  Head: Normocephalic and atraumatic.  Eyes: Conjunctivae and EOM are normal. Pupils are equal, round, and reactive to light.  Neck: Normal range of motion. Neck supple. No JVD present. No thyromegaly present.  Cardiovascular: Normal rate and regular rhythm.  No murmur heard. Extra systoles.   Pulmonary/Chest: Effort normal and breath sounds normal. She has no wheezes. She has no rales.  Abdominal: Soft. Bowel sounds are normal. She exhibits no distension. There is no tenderness.  Musculoskeletal: Normal range of motion. She exhibits tenderness. She exhibits no edema.  Tail bone  Neurological: She is alert and oriented to person, place, and time. She has normal reflexes. She exhibits normal muscle tone. Coordination normal.  Skin: Skin is warm and dry. No rash noted. She is not diaphoretic. No erythema.  Lower back, R+L hip surgical scars.   Psychiatric: She has a normal mood and affect. Her behavior is normal. Judgment and thought content normal.    Labs reviewed: Basic Metabolic Panel: Recent Labs    04/16/17  11/03/17 1818  11/03/17 2039 11/05/17 0346  11/20/17 12/05/17 12/24/17 0700  NA 129*   < > 136   < > 137 135   < > 139 133* 135  K 4.8   < > 3.7   < > 3.7 3.9   < > 3.7 4.0 3.9  CL  --    < > 103   < > 103 109  --   --   --  97*  CO2  --    < > 25  --   --  22  --   --   --  31  GLUCOSE  --    < > 103*   < > 146* 119*  --   --   --  79  BUN 12   < > 23*   < >  26* 34*   < > 13 18 15   CREATININE 0.8   < > 0.89   < > 0.90 0.84   < > 0.7 0.7 0.78  CALCIUM  --    < > 9.2  --   --  8.2*  --   --   --  10.0  TSH 33.00*  --   --   --   --   --   --  2.00  --   --    < > = values in this interval not displayed.   Liver Function Tests: Recent Labs    07/24/17 0931 08/21/17 0000 11/03/17 1818 11/19/17 12/05/17 12/24/17 0700  AST 17  17 16 20 18 23 17  17   ALT 11  11 14   13* 12 20 12  12   ALKPHOS 90  90  --  78 74 97  --   BILITOT 0.4  0.4 0.3 0.4  --   --  0.4  0.4  PROT 7.0  7.0 7.0 6.5  --   --  7.2  7.2  ALBUMIN 3.6  3.6  --  3.1*  --   --   --    No results for input(s): LIPASE, AMYLASE in the last 8760 hours. No results for input(s): AMMONIA in the last 8760 hours. CBC: Recent Labs    11/14/17 0334  11/15/17 0357  11/28/17 12/05/17 12/24/17 0700  WBC 10.2  --  9.6   < > 8.6 7.2 6.5  HGB 7.6*   < > 9.7*   < > 8.7* 8.9* 10.7*  HCT 22.9*   < > 29.9*   < > 26* 26* 31.9*  MCV 88.8  --  88.7  --   --   --  86.4  PLT 235  --  270   < > 319 404* 389   < > = values in this interval not displayed.   Lipid Panel: No results for input(s): CHOL, HDL, LDLCALC, TRIG, CHOLHDL, LDLDIRECT in the last 8760 hours. Lab Results  Component Value Date   HGBA1C 5.3 04/16/2017    Procedures since last visit: No results found.  Assessment/Plan Fall a mechanical  fall 12/2217 at home when she lost balance during turning,  some soreness in tail bone, Tylenol prn is adequate. Safety awareness is emphasized.   .   Coccyalgia Since the fall 01/07/18, worse with movement, Tylenol prn is adequate, will observe.   Hyponatremia History of hyponatremia, Na 135 12/23/17. She stated her weights are stable, she oral intake is at her baseline even if her appetite remains poor. Update BMP      HTN (hypertension) Her blood pressure and HR is controlled, continue Atenolol 35m po daily.   Blood loss anemia Hx acute blood loss anemia, she stated she always taking Vit B12 and Folate, Fe was 36 12/23/17, lower end of normal, her Iron is increased  bid/qd, her Hgb 10.7 12/23/17, no s/s of GI bleed, denied stomach pain, indigestion, or nausea/vomiting. Will F/u CBC, Fe, Vit B12, continue Vti B12 and Folate for now.        Labs/tests ordered: CBC Iron Vit B12 level BMP prior to the next appointment.   Next appt:  6 weeks   Time spend 25 minutes.

## 2018-01-09 NOTE — Assessment & Plan Note (Signed)
a mechanical  fall 12/2217 at home when she lost balance during turning,  some soreness in tail bone, Tylenol prn is adequate. Safety awareness is emphasized.   Marland Kitchen

## 2018-01-09 NOTE — Assessment & Plan Note (Deleted)
Hx Fe was 36 12/23/17, lower end of normal, her Iron is increased  bid/qd, her Hgb 10.7 12/23/17, no s/s of GI bleed, denied stomach pain, indigestion, or nausea/vomiting. F/u CBC, Fe, Vit B12, continue Vti B12 po

## 2018-01-09 NOTE — Assessment & Plan Note (Signed)
Since the fall 01/07/18, worse with movement, Tylenol prn is adequate, will observe.

## 2018-01-09 NOTE — Patient Instructions (Signed)
Change her appoint to 6 months, CBC Iron Vit B12 level BMP prior to the appointment.

## 2018-01-16 ENCOUNTER — Emergency Department (HOSPITAL_COMMUNITY)
Admission: EM | Admit: 2018-01-16 | Discharge: 2018-01-16 | Disposition: A | Discharge status: Home / Self Care | Payer: Medicare HMO | Attending: Emergency Medicine | Admitting: Emergency Medicine

## 2018-01-16 ENCOUNTER — Ambulatory Visit: Payer: Medicare HMO | Admitting: Nurse Practitioner

## 2018-01-16 ENCOUNTER — Other Ambulatory Visit: Payer: Self-pay

## 2018-01-16 ENCOUNTER — Encounter (HOSPITAL_COMMUNITY): Payer: Self-pay | Admitting: Emergency Medicine

## 2018-01-16 ENCOUNTER — Emergency Department (HOSPITAL_COMMUNITY): Payer: Medicare HMO

## 2018-01-16 DIAGNOSIS — R69 Illness, unspecified: Secondary | ICD-10-CM | POA: Diagnosis not present

## 2018-01-16 DIAGNOSIS — R531 Weakness: Secondary | ICD-10-CM | POA: Insufficient documentation

## 2018-01-16 DIAGNOSIS — I10 Essential (primary) hypertension: Secondary | ICD-10-CM | POA: Diagnosis not present

## 2018-01-16 DIAGNOSIS — R509 Fever, unspecified: Secondary | ICD-10-CM | POA: Diagnosis not present

## 2018-01-16 DIAGNOSIS — M546 Pain in thoracic spine: Secondary | ICD-10-CM | POA: Diagnosis not present

## 2018-01-16 DIAGNOSIS — E611 Iron deficiency: Secondary | ICD-10-CM | POA: Diagnosis not present

## 2018-01-16 DIAGNOSIS — K5909 Other constipation: Secondary | ICD-10-CM | POA: Diagnosis not present

## 2018-01-16 DIAGNOSIS — E119 Type 2 diabetes mellitus without complications: Secondary | ICD-10-CM | POA: Insufficient documentation

## 2018-01-16 DIAGNOSIS — M545 Low back pain, unspecified: Secondary | ICD-10-CM

## 2018-01-16 DIAGNOSIS — S79912A Unspecified injury of left hip, initial encounter: Secondary | ICD-10-CM | POA: Diagnosis not present

## 2018-01-16 DIAGNOSIS — F329 Major depressive disorder, single episode, unspecified: Secondary | ICD-10-CM | POA: Insufficient documentation

## 2018-01-16 DIAGNOSIS — F419 Anxiety disorder, unspecified: Secondary | ICD-10-CM | POA: Insufficient documentation

## 2018-01-16 DIAGNOSIS — R5383 Other fatigue: Secondary | ICD-10-CM | POA: Diagnosis not present

## 2018-01-16 DIAGNOSIS — Z96643 Presence of artificial hip joint, bilateral: Secondary | ICD-10-CM | POA: Diagnosis not present

## 2018-01-16 DIAGNOSIS — N318 Other neuromuscular dysfunction of bladder: Secondary | ICD-10-CM | POA: Diagnosis not present

## 2018-01-16 DIAGNOSIS — M150 Primary generalized (osteo)arthritis: Secondary | ICD-10-CM | POA: Diagnosis not present

## 2018-01-16 DIAGNOSIS — Z9181 History of falling: Secondary | ICD-10-CM | POA: Diagnosis not present

## 2018-01-16 DIAGNOSIS — R0982 Postnasal drip: Secondary | ICD-10-CM | POA: Diagnosis not present

## 2018-01-16 DIAGNOSIS — M549 Dorsalgia, unspecified: Secondary | ICD-10-CM | POA: Diagnosis not present

## 2018-01-16 DIAGNOSIS — R2681 Unsteadiness on feet: Secondary | ICD-10-CM | POA: Diagnosis not present

## 2018-01-16 DIAGNOSIS — Z9049 Acquired absence of other specified parts of digestive tract: Secondary | ICD-10-CM | POA: Diagnosis not present

## 2018-01-16 DIAGNOSIS — G8929 Other chronic pain: Secondary | ICD-10-CM | POA: Diagnosis not present

## 2018-01-16 DIAGNOSIS — M25552 Pain in left hip: Secondary | ICD-10-CM | POA: Insufficient documentation

## 2018-01-16 DIAGNOSIS — K5731 Diverticulosis of large intestine without perforation or abscess with bleeding: Secondary | ICD-10-CM | POA: Diagnosis not present

## 2018-01-16 DIAGNOSIS — S79911A Unspecified injury of right hip, initial encounter: Secondary | ICD-10-CM | POA: Diagnosis not present

## 2018-01-16 DIAGNOSIS — K219 Gastro-esophageal reflux disease without esophagitis: Secondary | ICD-10-CM | POA: Diagnosis not present

## 2018-01-16 DIAGNOSIS — K5900 Constipation, unspecified: Secondary | ICD-10-CM | POA: Diagnosis not present

## 2018-01-16 DIAGNOSIS — M25551 Pain in right hip: Secondary | ICD-10-CM | POA: Insufficient documentation

## 2018-01-16 DIAGNOSIS — Z79899 Other long term (current) drug therapy: Secondary | ICD-10-CM | POA: Insufficient documentation

## 2018-01-16 DIAGNOSIS — R1031 Right lower quadrant pain: Secondary | ICD-10-CM | POA: Diagnosis not present

## 2018-01-16 DIAGNOSIS — R0602 Shortness of breath: Secondary | ICD-10-CM | POA: Diagnosis not present

## 2018-01-16 DIAGNOSIS — M25559 Pain in unspecified hip: Secondary | ICD-10-CM | POA: Diagnosis not present

## 2018-01-16 DIAGNOSIS — F418 Other specified anxiety disorders: Secondary | ICD-10-CM | POA: Diagnosis not present

## 2018-01-16 DIAGNOSIS — W19XXXA Unspecified fall, initial encounter: Secondary | ICD-10-CM

## 2018-01-16 DIAGNOSIS — R079 Chest pain, unspecified: Secondary | ICD-10-CM | POA: Diagnosis not present

## 2018-01-16 DIAGNOSIS — Z85118 Personal history of other malignant neoplasm of bronchus and lung: Secondary | ICD-10-CM | POA: Diagnosis not present

## 2018-01-16 DIAGNOSIS — K922 Gastrointestinal hemorrhage, unspecified: Secondary | ICD-10-CM | POA: Diagnosis not present

## 2018-01-16 DIAGNOSIS — Z853 Personal history of malignant neoplasm of breast: Secondary | ICD-10-CM | POA: Diagnosis not present

## 2018-01-16 DIAGNOSIS — M6281 Muscle weakness (generalized): Secondary | ICD-10-CM | POA: Diagnosis not present

## 2018-01-16 DIAGNOSIS — M79675 Pain in left toe(s): Secondary | ICD-10-CM | POA: Diagnosis not present

## 2018-01-16 DIAGNOSIS — R1311 Dysphagia, oral phase: Secondary | ICD-10-CM | POA: Diagnosis not present

## 2018-01-16 DIAGNOSIS — R296 Repeated falls: Secondary | ICD-10-CM | POA: Diagnosis not present

## 2018-01-16 DIAGNOSIS — R29898 Other symptoms and signs involving the musculoskeletal system: Secondary | ICD-10-CM | POA: Diagnosis not present

## 2018-01-16 DIAGNOSIS — R8290 Unspecified abnormal findings in urine: Secondary | ICD-10-CM | POA: Diagnosis not present

## 2018-01-16 LAB — CBC
HEMATOCRIT: 32.7 % — AB (ref 36.0–46.0)
HEMOGLOBIN: 11 g/dL — AB (ref 12.0–15.0)
MCH: 29.7 pg (ref 26.0–34.0)
MCHC: 33.6 g/dL (ref 30.0–36.0)
MCV: 88.4 fL (ref 78.0–100.0)
Platelets: 278 10*3/uL (ref 150–400)
RBC: 3.7 MIL/uL — AB (ref 3.87–5.11)
RDW: 17.5 % — ABNORMAL HIGH (ref 11.5–15.5)
WBC: 10.3 10*3/uL (ref 4.0–10.5)

## 2018-01-16 LAB — BASIC METABOLIC PANEL
ANION GAP: 6 (ref 5–15)
BUN: 18 mg/dL (ref 6–20)
CHLORIDE: 101 mmol/L (ref 101–111)
CO2: 29 mmol/L (ref 22–32)
Calcium: 9.8 mg/dL (ref 8.9–10.3)
Creatinine, Ser: 0.7 mg/dL (ref 0.44–1.00)
GFR calc non Af Amer: >60 mL/min (ref >60)
GLUCOSE: 99 mg/dL (ref 65–99)
POTASSIUM: 3.5 mmol/L (ref 3.5–5.1)
Sodium: 136 mmol/L (ref 135–145)

## 2018-01-16 LAB — URINALYSIS, ROUTINE W REFLEX MICROSCOPIC
BILIRUBIN URINE: NEGATIVE
Glucose, UA: NEGATIVE mg/dL
Hgb urine dipstick: NEGATIVE
Ketones, ur: NEGATIVE mg/dL
NITRITE: NEGATIVE
PH: 6 (ref 5.0–8.0)
PROTEIN: NEGATIVE mg/dL
Specific Gravity, Urine: 1.01 (ref 1.005–1.030)

## 2018-01-16 MED ORDER — GABAPENTIN 100 MG PO CAPS
100.0000 mg | ORAL_CAPSULE | Freq: Once | ORAL | Status: AC
  Administered 2018-01-16: 100 mg via ORAL
  Filled 2018-01-16: qty 1

## 2018-01-16 MED ORDER — GABAPENTIN 100 MG PO CAPS: 100.0000 mg | ORAL_CAPSULE | Freq: Three times a day (TID) | ORAL | 0 refills | Status: DC

## 2018-01-16 MED ORDER — ACETAMINOPHEN 325 MG PO TABS
650.0000 mg | ORAL_TABLET | Freq: Once | ORAL | Status: AC
  Administered 2018-01-16: 650 mg via ORAL
  Filled 2018-01-16: qty 2

## 2018-01-16 NOTE — ED Provider Notes (Signed)
[facility] DEPT Provider Note   CSN: [id number] Arrival date & time: 01/16/18  4967     History   Chief Complaint Chief Complaint  Patient presents with  . Hip Pain  . Back Pain    HPI  [name] is a 84 y.o. Female with a history of lung cancer s/p radiation, hyperlipidemia, GERD, osteoporosis, [facility] of [facility] pain and bilateral hip pain.  Patient reports she fell approximately 10 days ago, and has had worsening pain since then.  Patient denies hitting her head, reports she fell and slid down a wall landing on her bottom.  Patient she reports she was not evaluated at the hospital, but was seen by the doctor at her facility initially, no imaging done.  Patient reports over the course of the last 10 days she has had worsening pain in bilateral hips, and was concerned because she has had both of them replaced.  Patient also reports pain radiating across her lower back.  Patient denies any numbness or tingling in her legs.  Patient does report she is been less active due to this pain feels like she is become weaker because of this.  Patient reports she walks with a walker at baseline, and uses a power chair to get to the dining hall at her facility.  Patient reports aside from this she spent most of the past few days in bed due to pain, she is been taking Tylenol which has helped some.      Past Medical History:  Diagnosis Date  . Allergic rhinitis   . Anemia    NOS  . Anxiety   . Biceps tendon rupture    Bilateral  . Breast cancer ([place]) 2001   Left s/p lumpectomy and XRT   . Bronchiectasis ([place]) 2014   Noted on chest x-ray  . Complication of anesthesia    slow to wake up  . Cough 2014  . Depression   . Diverticulosis   . Elevated liver enzymes    Biopsy consistent with steatohepatitis  . Fundic gland polyps of stomach, benign   . GERD (gastroesophageal reflux disease) 06/2007   EGD Dr Gala Romney >sm  HH, multiple fundic gland polyps  . Glaucoma 2013   both eyes  . Hemorrhoids   . Hiatal hernia   . Hx of radiation therapy 07/17/10 to 07/21/10   SRS LLL lung  . Hyperlipidemia   . Insomnia   . Low back pain    scoliosis  . Lung cancer ([place]) 05/2010   Left 2011 - rad x 3  . Lymphedema    Left arm  . Osteoarthritis   . Osteopenia 10/25/2016  . Osteoporosis, senile   . Overactive bladder   . Pneumonia    RLL with sepsis  . PSVT (paroxysmal supraventricular tachycardia) ([place])   . Rheumatic fever    Age 37  . Right thyroid nodule   . Steatohepatitis    liver bx  . Type 2 diabetes mellitus [facility])     Patient Active Problem List   Diagnosis Date Noted  . Fall 01/09/2018  . Coccyalgia 01/09/2018  . History of GI diverticular bleed 12/25/2017  . Protein-calorie malnutrition ([place]) 12/05/2017  . Advanced care planning/counseling discussion 11/20/2017  . Unsteady gait 11/19/2017  . TSH elevation 11/19/2017  . Permanent atrial fibrillation ([place]) 11/19/2017  . Melena   . Diverticulosis of colon with hemorrhage   . Hematochezia 11/03/2017  .  Decrease in appetite 04/03/2017  . Generalized weakness 04/03/2017  . Otalgia 10/25/2016  . Osteopenia 10/25/2016  . Hyponatremia 07/09/2015  . Hearing loss of both ears 04/08/2014  . HTN (hypertension) 03/27/2014  . Constipation 03/27/2014  . Skin lesion of face 03/27/2014  . Bronchiectasis ([place])   . Cough   . Rotator cuff syndrome of right shoulder 09/30/2012  . Breast cancer ([place])   . Hx of radiation therapy   . Lung cancer, lower lobe ([place]) 10/25/2011  . PSVT (paroxysmal supraventricular tachycardia) ([place]) 03/29/2011  . HIP PAIN 05/03/2010  . TOTAL HIP FOLLOW-UP 05/03/2010  . INTERDIGITAL NEUROMA 03/28/2010  . IMPINGEMENT SYNDROME 03/28/2010  . THYROID NODULE, RIGHT 09/06/2008  . HIP, ARTHRITIS, DEGEN./OSTEO 05/03/2008  . PALPITATIONS, RECURRENT 08/27/2007  . Depression with anxiety 07/31/2007  . WEAKNESS, MUSCLE 05/28/2007  .  Other specified disorders of adrenal gland ([place]) 05/14/2007  . Fatigue 05/01/2007  . Type 2 diabetes mellitus with hyperglycemia ([place]) 01/28/2007  . Hyperlipidemia 01/28/2007  . Blood loss anemia 01/28/2007  . Allergic rhinitis 01/28/2007  . GERD 01/28/2007  . OVERACTIVE BLADDER 01/28/2007  . Osteoarthritis 01/28/2007  . LOW BACK PAIN 01/28/2007  . Osteoporosis 01/28/2007    Past Surgical History:  Procedure Laterality Date  . ABDOMINAL HYSTERECTOMY  1964  . APPENDECTOMY  1964  . BIOPSY THYROID  11/2008  . BREAST BIOPSY     X 3 w/ cystectomy  . BREAST LUMPECTOMY  2001  . CATARACT EXTRACTION Bilateral 2003   Lens implant Dr. Charise Killian  . CHOLECYSTECTOMY  1965  . COLONOSCOPY  09/11/2011   Procedure: COLONOSCOPY;  Surgeon: Dorothyann Peng, MD;  [facility];  Service: Endoscopy;  Laterality: N/A;  . COLONOSCOPY  2005 /2012   Dr. Lucianne Muss- diverticulosis, ext hemorrhoids.  . COLONOSCOPY WITH PROPOFOL N/A 11/09/2017   Procedure: COLONOSCOPY WITH PROPOFOL;  Surgeon: Mauri Pole, MD;  Location: [facility];  Service: Endoscopy;  Laterality: N/A;  . COLONOSCOPY WITH PROPOFOL N/A 11/12/2017   Procedure: COLONOSCOPY WITH PROPOFOL;  Surgeon: Ladene Artist, MD;  Location: [facility];  Service: Endoscopy;  Laterality: N/A;  . CYSTOSCOPY  2007  . ESOPHAGOGASTRODUODENOSCOPY  09/11/2011   Procedure: ESOPHAGOGASTRODUODENOSCOPY (EGD);  Surgeon: Dorothyann Peng, MD;  [facility];  Service: Endoscopy;  Laterality: N/A;  . ESOPHAGOGASTRODUODENOSCOPY (EGD) WITH PROPOFOL N/A 11/08/2017   Procedure: ESOPHAGOGASTRODUODENOSCOPY (EGD) WITH PROPOFOL;  Surgeon: Mauri Pole, MD;  [facility];  Service: Endoscopy;  Laterality: N/A;  . LIVER BIOPSY  2008  . LUNG BIOPSY Left 06/06/2010  . REVISION TOTAL HIP ARTHROPLASTY  2007   Left  . SKIN CANCER EXCISION     nose and left lower cheek  . TONSILLECTOMY  1930  . TOTAL HIP ARTHROPLASTY Bilateral 2005 & 2007    Dr.  Earl Lagos. Aline Brochure     OB History    No data available       Home Medications    Prior to Admission medications   Medication Sig Start Date End Date Taking? Authorizing Provider  acetaminophen (TYLENOL) 500 MG tablet Take 500 mg by mouth every 6 (six) hours as needed for mild pain.   Yes [provider]  atenolol (TENORMIN) 25 MG tablet Take 25 mg by mouth daily.   Yes [provider]  cetirizine (ZYRTEC) 10 MG tablet Take 10 mg by mouth daily.   Yes [provider]  dorzolamide-timolol (COSOPT) 22.3-6.8 MG/ML ophthalmic solution Place 1 drop into both eyes 2 (two) times  daily.    Yes [provider]  DULoxetine (CYMBALTA) 20 MG capsule TAKE (1) CAPSULE DAILY. 09/04/17  Yes Mast, Man X, NP  Ferrous Gluconate (IRON 27 PO) Take 1 tablet by mouth daily.    Yes [provider]  folic acid (FOLVITE) 353 MCG tablet Take 400 mcg by mouth daily.   Yes [provider]  hydrocortisone (ANUSOL-HC) 2.5 % rectal cream Place 1 application rectally 2 (two) times daily.   Yes [provider]  lactose free nutrition (BOOST) LIQD Take 237 mLs by mouth 2 (two) times daily between meals.   Yes [provider]  latanoprost (XALATAN) 0.005 % ophthalmic solution Place 1 drop into both eyes at bedtime.   Yes [provider]  Multiple Vitamins-Minerals (PRESERVISION AREDS 2 PO) Take 1 capsule by mouth 2 (two) times daily.    Yes [provider]  omeprazole (PRILOSEC) 20 MG capsule TAKE 1 CAPSULE EVERY DAY. 07/05/17  Yes Pandey, Mahima, MD  vitamin B-12 (CYANOCOBALAMIN) 1000 MCG tablet Take 1,000 mcg by mouth daily.   Yes [provider]  gabapentin (NEURONTIN) 100 MG capsule Take 1 capsule (100 mg total) by mouth 3 (three) times daily. 01/16/18   Jacqlyn Larsen, PA-C    Family History Family History  Problem Relation Age of Onset  . Heart failure Mother   . Osteoporosis Mother   . Hypertension Father   .  Stroke Father   . Heart failure Father   . Leukemia Brother   . Heart failure Brother   . Cancer Maternal Grandmother   . Stroke Maternal Grandfather   . Cancer Unknown     Social History Social History   Tobacco Use  . Smoking status: Never Smoker  . Smokeless tobacco: Never Used  . Tobacco comment: 2nd hand-husband  Substance Use Topics  . Alcohol use: No  . Drug use: No     Allergies   Augmentin [amoxicillin-pot clavulanate]; Ibuprofen; Naproxen; Statins; Surgilube [gyne-moistrin]; and Tape   Review of Systems Review of Systems  Constitutional: Negative for chills and fever.  HENT: Negative for congestion, rhinorrhea and sore throat.   Eyes: Negative for visual disturbance.  Respiratory: Negative for cough and shortness of breath.   Cardiovascular: Negative for chest pain.  Gastrointestinal: Negative for abdominal pain, nausea and vomiting.  Genitourinary: Negative for dysuria and frequency.  Musculoskeletal: Positive for back pain and myalgias.  Skin: Negative for color change, pallor, rash and wound.  Neurological: Positive for weakness and headaches. Negative for dizziness, light-headedness and numbness.     Physical Exam Updated Vital Signs BP (!) 160/94 (BP Location: Right Arm)   Pulse 74   Temp 98 F (36.7 C) (Oral)   Resp 18   Ht 5' 4"  (1.626 m)   Wt 68.5 kg (151 lb)   SpO2 98%   BMI 25.92 kg/m   Physical Exam  Constitutional: She appears well-developed and well-nourished. No distress.  HENT:  Head: Normocephalic and atraumatic.  Eyes: EOM are normal. Pupils are equal, round, and reactive to light. Right eye exhibits no discharge. Left eye exhibits no discharge.  Neck: Normal range of motion. Neck supple.  Cardiovascular: Normal rate, regular rhythm, normal heart sounds and intact distal pulses.  Pulmonary/Chest: Effort normal and breath sounds normal. No stridor. No respiratory distress. She has no wheezes. She has no rales.  Abdominal: Soft.  Bowel sounds are normal. She exhibits no distension and no mass. There is no tenderness. There is no guarding.  Musculoskeletal:  Tenderness across the lower back, no tenderness at midline about the spine, no palpable deformity, ecchymosis or discoloration, pain slightly increased with range of motion of the lower extremities Bilateral hips nontender to palpation, no palpable deformity or ecchymosis present  Neurological: She is alert. Coordination normal.  Bilateral lower extremities with 5 out of 5 strength with plantar and dorsiflexion, proximal muscle strength intact, sensation intact bilaterally  Skin: Skin is warm and dry. Capillary refill takes less than 2 seconds. She is not diaphoretic.  Psychiatric: She has a normal mood and affect. Her behavior is normal.  Nursing note and vitals reviewed.    ED Treatments / Results  Labs (all labs ordered are listed, but only abnormal results are displayed) Labs Reviewed  CBC - Abnormal; Notable for the following components:      Result Value   RBC 3.70 (*)    Hemoglobin 11.0 (*)    HCT 32.7 (*)    RDW 17.5 (*)    All other components within normal limits  URINALYSIS, ROUTINE W REFLEX MICROSCOPIC - Abnormal; Notable for the following components:   Leukocytes, UA TRACE (*)    Bacteria, UA MANY (*)    Squamous Epithelial / LPF 0-5 (*)    All other components within normal limits  URINE CULTURE  BASIC METABOLIC PANEL    EKG  EKG Interpretation None       Radiology Dg Chest 2 View  Result Date: 01/16/2018 CLINICAL DATA:  Weakness and shortness of breath EXAM: CHEST  2 VIEW COMPARISON:  06/14/2015 FINDINGS: Cardiac shadow is within normal limits. The lungs are well aerated bilaterally. No focal infiltrate or sizable effusion is seen. Chronic blunting of left costophrenic angle is noted. No new focal abnormality is seen. No acute bony abnormality is noted. IMPRESSION: Chronic changes in the left base.  No acute abnormality noted.  Electronically Signed   By: Inez Catalina M.D.   On: 01/16/2018 10:23   Dg Lumbar Spine Complete  Result Date: 01/16/2018 CLINICAL DATA:  Pt from SNF with fall x 10 days ago complains of bilat hip, low back pain worsening since fall EXAM: LUMBAR SPINE - COMPLETE 4+ VIEW COMPARISON:  PET-CT 01/30/2010 FINDINGS: Six lumbar type vertebral bodies, numbering based on previous CT exam from 11/17/2009. There is moderate degenerative change in the mid and lower lumbar levels. No acute fracture. There is 7 millimeters anterolisthesis of L4 on L5. there is 7 millimeters anterolisthesis of L3 on L4. There is 5 millimeters anterolisthesis of L2 on L3. IMPRESSION: 1. No acute fracture. 2. Grade 1 anterolisthesis at L2-3, L3-4, and L4-5. Electronically Signed   By: Nolon Nations M.D.   On: 01/16/2018 08:46   Dg Hips Bilat W Or Wo Pelvis 5 Views  Result Date: 01/16/2018 CLINICAL DATA:  Pt from SNF with fall x 10 days ago complains of bilat hip, low back pain worsening since fall EXAM: DG HIP (WITH OR WITHOUT PELVIS) 5+V BILAT COMPARISON:  PET-CT 01/30/2010, abdominal CT 02/17/2008 FINDINGS: The patient has had bilateral hip arthroplasty. There is deformity left superior and inferior pubic rami, consistent with fractures of indeterminate age. The appearance is new since the previous CT exam of 2011 favors a chronic process over an acute or subacute fracture. There is significant degenerative change in the lower lumbar spine and both SI joints. There is atherosclerotic calcification of the femoral arteries. No evidence for loosening of the hardware. IMPRESSION: 1. Deformity of the left superior and inferior pubic rami, favoring chronic process.  However, the abnormality is new since most recent comparison of 2011. 2. No evidence for dislocation of either hip arthroplasty. Electronically Signed   By: Nolon Nations M.D.   On: 01/16/2018 08:42    Procedures Procedures (including critical care time)  Medications  Ordered in ED Medications  acetaminophen (TYLENOL) tablet 650 mg (650 mg Oral Given 01/16/18 0272)  gabapentin (NEURONTIN) capsule 100 mg (100 mg Oral Given 01/16/18 1155)     Initial Impression / Assessment and Plan / ED Course  I have reviewed the triage vital signs and the nursing notes.  Pertinent labs & imaging results that were available during my care of the patient were reviewed by me and considered in my medical decision making (see chart for details).  Presents complaining of pain and generalized weakness after a fall 10 days ago.  Pain across lower back and bilateral hips.  Neurologic exam shows no neurologic deficits.  No palpable deformity or ecchymosis noted on exam, bilateral lower extremities neurovascularly intact.  Imaging of bilateral hips and lumbar spine given increasing pain.  Will also give dose of Tylenol.  X-rays of hips and lumbar spine without any acute abnormality, all hardware from previous bilateral hip replacements appears to be in place.  Discussed with Dr. Vanita Panda who saw and evaluated patient as well, recommends basic labs and chest x-ray to rule out infection or other medical cause of generalized weakness aside from deconditioning from being in bed due to pain.  Dr. Vanita Panda also suggests gabapentin for pain, will give first dose here in the ED.  Labs show no leukocytosis, hemoglobin at baseline, no electrolyte derangements requiring intervention, kidney function normal, UA without evidence of infection, culture pending.  Discussed these results with the patient.  We will also consult case management given patient's continued weakness as she likely needs a [facility], since she is currently in an independent living apartment.   Patient has been accepted to skilled nursing facility at [facility], patient stable for discharge home, [facility] for transport.  Prescription for gabapentin in addition to Tylenol provided for pain.   Return precautions discussed.  Patient discussed with Dr. Vanita Panda, who saw patient as well and agrees with plan.   Final Clinical Impressions(s) / ED Diagnoses   Final diagnoses:  Fall, initial encounter  Acute bilateral low back pain without sciatica  Pain of both hip joints  Weakness    ED Discharge Orders        Ordered    gabapentin (NEURONTIN) 100 MG capsule  3 times daily     01/16/18 1129       Jacqlyn Larsen, [place] 01/16/18 1640    Carmin Muskrat, MD 01/17/18 1330    Carmin Muskrat, MD 01/17/18 1346

## 2018-01-16 NOTE — ED Triage Notes (Signed)
Patient here from home lives at [facility] with complaints of back pain and bilateral hip pain. Reports that she fell 10 days ago and has had difficulty ambulating. Normally walks with walker.

## 2018-01-16 NOTE — ED Notes (Signed)
Report gave to [facility] at [facility].

## 2018-01-16 NOTE — NC FL2 (Signed)
[place] LEVEL OF CARE SCREENING TOOL     IDENTIFICATION  Patient Name: [name] Birthdate: 1921-02-27 Sex: female Admission Date (Current Location): 01/16/2018  [facility] and [place] Number:  Herbalist and Address:  [facility],  [place], [place]      Provider Number: [id number]  Attending Physician Name and Address:  Carmin Muskrat, MD  Relative Name and Phone Number:  [phone], [name]    Current Level of Care: Hospital Recommended Level of Care: [place] Prior Approval Number:    Date Approved/Denied:   PASRR Number:    Discharge Plan: SNF    Current Diagnoses: Patient Active Problem List   Diagnosis Date Noted  . Fall 01/09/2018  . Coccyalgia 01/09/2018  . History of GI diverticular bleed 12/25/2017  . Protein-calorie malnutrition ([place]) 12/05/2017  . Advanced care planning/counseling discussion 11/20/2017  . Unsteady gait 11/19/2017  . TSH elevation 11/19/2017  . Permanent atrial fibrillation ([place]) 11/19/2017  . Melena   . Diverticulosis of colon with hemorrhage   . Hematochezia 11/03/2017  . Decrease in appetite 04/03/2017  . Generalized weakness 04/03/2017  . Otalgia 10/25/2016  . Osteopenia 10/25/2016  . Hyponatremia 07/09/2015  . Hearing loss of both ears 04/08/2014  . HTN (hypertension) 03/27/2014  . Constipation 03/27/2014  . Skin lesion of face 03/27/2014  . Bronchiectasis ([place])   . Cough   . Rotator cuff syndrome of right shoulder 09/30/2012  . Breast cancer ([place])   . Hx of radiation therapy   . Lung cancer, lower lobe ([place]) 10/25/2011  . PSVT (paroxysmal supraventricular tachycardia) ([place]) 03/29/2011  . HIP PAIN 05/03/2010  . TOTAL HIP FOLLOW-UP 05/03/2010  . INTERDIGITAL NEUROMA 03/28/2010  . IMPINGEMENT SYNDROME 03/28/2010  . THYROID NODULE, RIGHT 09/06/2008  . HIP, ARTHRITIS, DEGEN./OSTEO 05/03/2008  . PALPITATIONS, RECURRENT 08/27/2007  . Depression  with anxiety 07/31/2007  . WEAKNESS, MUSCLE 05/28/2007  . Other specified disorders of adrenal gland ([place]) 05/14/2007  . Fatigue 05/01/2007  . Type 2 diabetes mellitus with hyperglycemia ([place]) 01/28/2007  . Hyperlipidemia 01/28/2007  . Blood loss anemia 01/28/2007  . Allergic rhinitis 01/28/2007  . GERD 01/28/2007  . OVERACTIVE BLADDER 01/28/2007  . Osteoarthritis 01/28/2007  . LOW BACK PAIN 01/28/2007  . Osteoporosis 01/28/2007    Orientation RESPIRATION BLADDER Height & Weight     Self, Time, Situation, Place  Normal Continent Weight:   Height:     BEHAVIORAL SYMPTOMS/MOOD NEUROLOGICAL BOWEL NUTRITION STATUS      Continent    AMBULATORY STATUS COMMUNICATION OF NEEDS Skin   Limited Assist Verbally                         Personal Care Assistance Level of Assistance  Bathing, Feeding, Dressing Bathing Assistance: Limited assistance Feeding assistance: Independent Dressing Assistance: Limited assistance     Functional Limitations Info             SPECIAL CARE FACTORS FREQUENCY  OT (By licensed OT), PT (By licensed PT)     PT Frequency: 5 OT Frequency: 5            Contractures      Additional Factors Info  Code Status, Allergies Code Status Info: Prior Allergies Info: Augmentin Amoxicillin-pot Clavulanate, Ibuprofen, Naproxen, Statins, Surgilube Gyne-moistrin, Tape           Current Medications (01/16/2018):  This is the current hospital active medication list Current Facility-Administered Medications  Medication Dose Route Frequency Provider Last Rate Last Dose  . gabapentin (NEURONTIN) capsule 100 mg  100 mg Oral Once Jacqlyn Larsen, [place]       Current Outpatient Medications  Medication Sig Dispense Refill  . acetaminophen (TYLENOL) 500 MG tablet Take 500 mg by mouth every 6 (six) hours as needed for mild pain.    Marland Kitchen atenolol (TENORMIN) 25 MG tablet Take 25 mg by mouth daily.    . cetirizine (ZYRTEC) 10 MG tablet Take 10 mg by mouth daily.     . dorzolamide-timolol (COSOPT) 22.3-6.8 MG/ML ophthalmic solution Place 1 drop into both eyes 2 (two) times daily.     . DULoxetine (CYMBALTA) 20 MG capsule TAKE (1) CAPSULE DAILY. 30 capsule 2  . Ferrous Gluconate (IRON 27 PO) Take 1 tablet by mouth daily.     . folic acid (FOLVITE) 982 MCG tablet Take 400 mcg by mouth daily.    . hydrocortisone (ANUSOL-HC) 2.5 % rectal cream Place 1 application rectally 2 (two) times daily.    Marland Kitchen lactose free nutrition (BOOST) LIQD Take 237 mLs by mouth 2 (two) times daily between meals.    . latanoprost (XALATAN) 0.005 % ophthalmic solution Place 1 drop into both eyes at bedtime.    . Multiple Vitamins-Minerals (PRESERVISION AREDS 2 PO) Take 1 capsule by mouth 2 (two) times daily.     Marland Kitchen omeprazole (PRILOSEC) 20 MG capsule TAKE 1 CAPSULE EVERY DAY. 90 capsule 1  . vitamin B-12 (CYANOCOBALAMIN) 1000 MCG tablet Take 1,000 mcg by mouth daily.    Marland Kitchen gabapentin (NEURONTIN) 100 MG capsule Take 1 capsule (100 mg total) by mouth 3 (three) times daily. 60 capsule 0     Discharge Medications: Please see discharge summary for a list of discharge medications.  Relevant Imaging Results:  Relevant Lab Results:   Additional Information [id number]  [name] Work [phone]

## 2018-01-16 NOTE — ED Notes (Signed)
Bed: WA20 Expected date:  Expected time:  Means of arrival:  Comments: 84 yr old fall x 10 days ago

## 2018-01-16 NOTE — Progress Notes (Cosign Needed)
Patient accepted to [facility] skilled nursing facility. CSW intern made patient, patients RN, and medical doctor aware of this information. [facility] requested that they be notified when [name] is on the way with patient [phone]). [facility] requested that an FL2 be sent to them, which CSW intern will complete.

## 2018-01-16 NOTE — Discharge Instructions (Signed)
Your evaluation today has been reassuring, imaging shows no acute fracture injury from your fall, and labs look good as well.  In addition to Tylenol will try gabapentin to help with your pain, you can take 100 mg 3 times daily.  Our ED case management [facility] with [facility], to assist getting you a higher level of care there.  If you have significantly worsening pain, numbness or tingling in your legs, weakness, or loss of control of your bowel or bladder please return to the ED for reevaluation otherwise please follow-up with your primary doctor at [facility].

## 2018-01-16 NOTE — Clinical Social Work Note (Cosign Needed)
Clinical Social Work Assessment  Patient Details  Name: [name] MRN: [id number] Date of Birth: 09/02/1921  Date of referral:  01/16/18               Reason for consult:  Facility Placement                Permission sought to share information with:    Permission granted to share information::     Name::        Agency::     Relationship::     Contact Information:     Housing/Transportation Living arrangements for the past 2 months:  [place] of Information:  Patient Patient Interpreter Needed:  None Criminal Activity/Legal Involvement Pertinent to Current Situation/Hospitalization:    Significant Relationships:  None Lives with:  Self Do you feel safe going back to the place where you live?  Yes Need for family participation in patient care:     Care giving concerns:  Patient here from home lives at [facility] with complaints of back pain and bilateral hip pain. Reports that she fell 10 days ago and has had difficulty ambulating.   Social Worker assessment / plan:  CSW intern met with patient via bedside to discuss plans once discharged. Patient stated she currently lives in independent living at [facility] by herself. Patient stated she would like to go to the skilled nursing side of [facility] rather than going back to independent living.   CSW intern called [facility] to relay this information and to inform [facility] that the patient will be returning soon. Awaiting phone call from Yates Decamp [phone]) at [facility].  Employment status:  Retired Nurse, adult PT Recommendations:  [place] / Referral to community resources:  [place]  Patient/Family's Response to care:  Patient was accepting to care being provided, and appreciative. Patient is also proactive about changing her living situation once returning back to [facility].  Patient/Family's Understanding of and Emotional Response to Diagnosis, Current Treatment, and Prognosis: Patient is understanding of current diagnosis and treatment.   Emotional Assessment Appearance:  Appears stated age Attitude/Demeanor/Rapport:    Affect (typically observed):  Accepting, Pleasant, Calm Orientation:  Oriented to Self, Oriented to Place, Oriented to  Time, Oriented to Situation Alcohol / Substance use:    Psych involvement (Current and /or in the community):  No (Comment)  Discharge Needs  Concerns to be addressed:  No discharge needs identified Readmission within the last 30 days:  No Current discharge risk:  None Barriers to Discharge:  No Barriers Identified   Willeen Niece, Student-Social Work 01/16/2018, 11:07 AM

## 2018-01-16 NOTE — ED Notes (Signed)
Pt is alert and oriented x 4 and is verbally responsive. Pt reports that she has a 4/10 pain to her left hip that worsens with activity. Pt admin. Gabapentin.

## 2018-01-16 NOTE — ED Notes (Signed)
PTAR  Called. Attempted to speak with RN at SNF , left message that pt would be retuning discharge information will be sent and has been discussed with patient understanding verbalized.

## 2018-01-18 LAB — URINE CULTURE: Culture: >=100000 — AB

## 2018-01-19 ENCOUNTER — Telehealth: Payer: Self-pay

## 2018-01-19 NOTE — Telephone Encounter (Signed)
No treatment for UC from ED 01/16/18 per Geanie Kenning PA

## 2018-01-19 NOTE — Progress Notes (Signed)
ED Antimicrobial Stewardship Positive Culture Follow Up   [name] is an 84 y.o. female who presented to [facility] on 01/16/2018 with a chief complaint of  Chief Complaint  Patient presents with  . Hip Pain  . Back Pain    Recent Results (from the past 720 hour(s))  Urine culture     Status: Abnormal   Collection Time: 01/16/18  8:43 AM  Result Value Ref Range Status   Specimen Description   Final    URINE, CLEAN CATCH Performed at [facility], [place] [address], [place], [place] [address]    Special Requests   Final    NONE Performed at [facility], [place] [address], [place], [place] [address]    Culture >=100,000 COLONIES/mL KLEBSIELLA PNEUMONIAE (A)  Final   Report Status 01/18/2018 FINAL  Final   Organism ID, Bacteria KLEBSIELLA PNEUMONIAE (A)  Final      Susceptibility   Klebsiella pneumoniae - MIC*    AMPICILLIN >=32 RESISTANT Resistant     CEFAZOLIN <=4 SENSITIVE Sensitive     CEFTRIAXONE <=1 SENSITIVE Sensitive     CIPROFLOXACIN <=0.25 SENSITIVE Sensitive     GENTAMICIN <=1 SENSITIVE Sensitive     IMIPENEM <=0.25 SENSITIVE Sensitive     NITROFURANTOIN 32 SENSITIVE Sensitive     TRIMETH/SULFA <=20 SENSITIVE Sensitive     AMPICILLIN/SULBACTAM 8 SENSITIVE Sensitive     PIP/TAZO <=4 SENSITIVE Sensitive     Extended ESBL NEGATIVE Sensitive     * >=100,000 COLONIES/mL KLEBSIELLA PNEUMONIAE     New antibiotic prescription:  Asymptomatic bacteriuria  Do not treat  ED Provider: Fredrich Birks, PA-C   Susa Raring, PharmD 01/19/2018, 9:19 AM Infectious Diseases Pharmacy Resident Phone# [phone]

## 2018-01-21 ENCOUNTER — Encounter: Payer: Self-pay | Admitting: Internal Medicine

## 2018-01-21 ENCOUNTER — Non-Acute Institutional Stay (SKILLED_NURSING_FACILITY): Payer: Medicare HMO | Admitting: Internal Medicine

## 2018-01-21 DIAGNOSIS — I10 Essential (primary) hypertension: Secondary | ICD-10-CM

## 2018-01-21 DIAGNOSIS — R0982 Postnasal drip: Secondary | ICD-10-CM | POA: Insufficient documentation

## 2018-01-21 DIAGNOSIS — K219 Gastro-esophageal reflux disease without esophagitis: Secondary | ICD-10-CM

## 2018-01-21 DIAGNOSIS — M545 Low back pain, unspecified: Secondary | ICD-10-CM

## 2018-01-21 DIAGNOSIS — M25552 Pain in left hip: Secondary | ICD-10-CM

## 2018-01-21 DIAGNOSIS — E611 Iron deficiency: Secondary | ICD-10-CM | POA: Insufficient documentation

## 2018-01-21 DIAGNOSIS — R2681 Unsteadiness on feet: Secondary | ICD-10-CM

## 2018-01-21 DIAGNOSIS — R531 Weakness: Secondary | ICD-10-CM

## 2018-01-21 NOTE — Progress Notes (Signed)
Provider:  Blanchie Serve MD  Location:  [place] Room Number: 17 Place of Service:  SNF (31)  PCP: Blanchie Serve, MD Patient Care Team: Blanchie Serve, MD as PCP - General (Internal Medicine) Lanelle Bal., MD as Referring Physician (Internal Medicine) Satira Sark, MD as Consulting Physician (Cardiology) Kyung Rudd, MD as Consulting Physician (Radiation Oncology)  Extended Emergency Contact Information Primary Emergency Contact: [name] of [place] Phone: [phone] Relation: Son Secondary Emergency Contact: [name] Address: [address]          [name], [place] [address] [name] of [name] Phone: [phone] Mobile Phone: [phone] Relation: Son  Code Status: DNR  Goals of Care: Advanced Directive information Advanced Directives 01/21/2018  Does Patient Have a Medical Advance Directive? Yes  Type of Advance Directive Living will;Out of facility DNR (pink MOST or yellow form)  Does patient want to make changes to medical advance directive? No - Patient declined  Copy of [place] in Chart? -  Would patient like information on creating a medical advance directive? -  Pre-existing out of facility DNR order (yellow form or pink MOST form) Pink MOST form placed in chart (order not valid for inpatient use)      Chief Complaint  Patient presents with  . Readmit To SNF    Readmission Visit     HPI: Patient is a 84 y.o. [name] seen today for readmission visit. She was in the ED on 01/16/18 with complaints of back and hip pain. She had a fall almost 2 weeks back and hit her bottom. With days her pain worsened and she went to the ED. Imaging did not show acute fracture but left superior and inferior pubic ramus deformity consistent with fractures of indeterminate age. She was seen by orthopedic team in ED and pain management with gabapentin and therapy was recommended. She was  discharged to SNF. She was residing in [name] living prior to this ED visit. She is seen in her room today. She complaints of poor sleep last night. Pain to left hip and lower back is bothering her, it worsens with movement. She has medical history of recent gi bleed, GERD, osteoporosis, HLD, lung cancer s/p radiation. She is seen in her room.    Past Medical History:  Diagnosis Date  . Allergic rhinitis   . Anemia    NOS  . Anxiety   . Biceps tendon rupture    Bilateral  . Breast cancer ([place]) 2001   Left s/p lumpectomy and XRT   . Bronchiectasis ([place]) 2014   Noted on chest x-ray  . Complication of anesthesia    slow to wake up  . Cough 2014  . Depression   . Diverticulosis   . Elevated liver enzymes    Biopsy consistent with steatohepatitis  . Fundic gland polyps of stomach, benign   . GERD (gastroesophageal reflux disease) 06/2007   EGD Dr Gala Romney >sm HH, multiple fundic gland polyps  . Glaucoma 2013   both eyes  . Hemorrhoids   . Hiatal hernia   . Hx of radiation therapy 07/17/10 to 07/21/10   SRS LLL lung  . Hyperlipidemia   . Insomnia   . Low back pain    scoliosis  . Lung cancer ([name]) 05/2010   Left 2011 - rad x 3  . Lymphedema    Left arm  . Osteoarthritis   . Osteopenia 10/25/2016  . Osteoporosis, senile   .  Overactive bladder   . Pneumonia    RLL with sepsis  . PSVT (paroxysmal supraventricular tachycardia) ([name])   . Rheumatic fever    Age 7  . Right thyroid nodule   . Steatohepatitis    liver bx  . Type 2 diabetes mellitus ([name])    Past Surgical History:  Procedure Laterality Date  . ABDOMINAL HYSTERECTOMY  1964  . APPENDECTOMY  1964  . BIOPSY THYROID  11/2008  . BREAST BIOPSY     X 3 w/ cystectomy  . BREAST LUMPECTOMY  2001  . CATARACT EXTRACTION Bilateral 2003   Lens implant Dr. Charise Killian  . CHOLECYSTECTOMY  1965  . COLONOSCOPY  09/11/2011   Procedure: COLONOSCOPY;  Surgeon: Dorothyann Peng, MD;  [facility];  Service: Endoscopy;   Laterality: N/A;  . COLONOSCOPY  2005 /2012   Dr. Lucianne Muss- diverticulosis, ext hemorrhoids.  . COLONOSCOPY WITH PROPOFOL N/A 11/09/2017   Procedure: COLONOSCOPY WITH PROPOFOL;  Surgeon: Mauri Pole, MD;  Location: [facility];  Service: Endoscopy;  Laterality: N/A;  . COLONOSCOPY WITH PROPOFOL N/A 11/12/2017   Procedure: COLONOSCOPY WITH PROPOFOL;  Surgeon: Ladene Artist, MD;  Location: [facility];  Service: Endoscopy;  Laterality: N/A;  . CYSTOSCOPY  2007  . ESOPHAGOGASTRODUODENOSCOPY  09/11/2011   Procedure: ESOPHAGOGASTRODUODENOSCOPY (EGD);  Surgeon: Dorothyann Peng, MD;  [facility];  Service: Endoscopy;  Laterality: N/A;  . ESOPHAGOGASTRODUODENOSCOPY (EGD) WITH PROPOFOL N/A 11/08/2017   Procedure: ESOPHAGOGASTRODUODENOSCOPY (EGD) WITH PROPOFOL;  Surgeon: Mauri Pole, MD;  [facility];  Service: Endoscopy;  Laterality: N/A;  . LIVER BIOPSY  2008  . LUNG BIOPSY Left 06/06/2010  . REVISION TOTAL HIP ARTHROPLASTY  2007   Left  . SKIN CANCER EXCISION     nose and left lower cheek  . TONSILLECTOMY  1930  . TOTAL HIP ARTHROPLASTY Bilateral 2005 & 2007    Dr. Earl Lagos. Aline Brochure     reports that  has never smoked. she has never used smokeless tobacco. She reports that she does not drink alcohol or use drugs. Social History   Socioeconomic History  . Marital status: Widowed    Spouse name: Not on file  . Number of children: 2  . Years of education: college   . Highest education level: Not on file  Social Needs  . Financial resource strain: Not on file  . Food insecurity - worry: Not on file  . Food insecurity - inability: Not on file  . Transportation needs - medical: Not on file  . Transportation needs - non-medical: Not on file  Occupational History  . Occupation: retired Tax Comptroller: RETIRED  Tobacco Use  . Smoking status: Never Smoker  . Smokeless tobacco: Never Used  . Tobacco comment: 2nd hand-husband  Substance and  Sexual Activity  . Alcohol use: No  . Drug use: No  . Sexual activity: Not Currently  Other Topics Concern  . Not on file  Social History Narrative   Lives at [place] [facility] 02/2013   2 adopted children   Was in [place], [name]   Widowed 2013   Walks with walker   Exercise: none    POA - Living Will    Functional Status Survey:    Family History  Problem Relation Age of Onset  . Heart failure Mother   . Osteoporosis Mother   . Hypertension Father   . Stroke Father   . Heart failure Father   .  Leukemia Brother   . Heart failure Brother   . Cancer Maternal Grandmother   . Stroke Maternal Grandfather   . Cancer Unknown     Health Maintenance  Topic Date Due  . FOOT EXAM  07/19/1931  . OPHTHALMOLOGY EXAM  07[birth year]19/1931  . PNA vac Low Risk Adult (2 of 2 - PCV13) 08/18/2007  . URINE MICROALBUMIN  04/30/2008  . TETANUS/TDAP  12/18/2015  . HEMOGLOBIN A1C  10/17/2017  . INFLUENZA VACCINE  Completed  . DEXA SCAN  Completed    Allergies  Allergen Reactions  . Augmentin [Amoxicillin-Pot Clavulanate] Other (See Comments)    unknown  . Ibuprofen Hives  . Naproxen Other (See Comments)    unknown  . Statins Other (See Comments)    Feels like knives in stomach  . Surgilube [Gyne-Moistrin]     Rectal itching, burning  . Tape Rash    Outpatient Encounter Medications as of 01/21/2018  Medication Sig  . acetaminophen (TYLENOL) 500 MG tablet Take 500 mg by mouth every 6 (six) hours as needed for mild pain.  Marland Kitchen atenolol (TENORMIN) 25 MG tablet Take 25 mg by mouth daily.  . cetirizine (ZYRTEC) 10 MG tablet Take 10 mg by mouth daily.  . dorzolamide-timolol (COSOPT) 22.3-6.8 MG/ML ophthalmic solution Place 1 drop into both eyes 2 (two) times daily.   . DULoxetine (CYMBALTA) 20 MG capsule TAKE (1) CAPSULE DAILY.  Marland Kitchen Ferrous Gluconate (IRON 27 PO) Take 1 tablet by mouth daily.   . folic acid (FOLVITE) 409 MCG tablet Take 400 mcg by mouth daily.  Marland Kitchen gabapentin (NEURONTIN) 100 MG  capsule Take 1 capsule (100 mg total) by mouth 3 (three) times daily.  . hydrocortisone (ANUSOL-HC) 2.5 % rectal cream Place 1 application rectally 2 (two) times daily.  Marland Kitchen lactose free nutrition (BOOST) LIQD Take 237 mLs by mouth 2 (two) times daily between meals.  . latanoprost (XALATAN) 0.005 % ophthalmic solution Place 1 drop into both eyes at bedtime.  . Multiple Vitamins-Minerals (PRESERVISION AREDS 2 PO) Take 1 capsule by mouth 2 (two) times daily.   Marland Kitchen omeprazole (PRILOSEC) 20 MG capsule TAKE 1 CAPSULE EVERY DAY.  . vitamin B-12 (CYANOCOBALAMIN) 1000 MCG tablet Take 1,000 mcg by mouth daily.   No facility-administered encounter medications on file as of 01/21/2018.     Review of Systems  Constitutional: Positive for appetite change and fatigue. Negative for chills, diaphoresis and fever.  HENT: Positive for hearing loss and postnasal drip. Negative for congestion, mouth sores, sinus pressure, sinus pain and trouble swallowing.   Eyes: Positive for visual disturbance.  Respiratory: Negative for cough, shortness of breath and wheezing.   Cardiovascular: Positive for leg swelling. Negative for chest pain and palpitations.  Gastrointestinal: Negative for abdominal pain, constipation, diarrhea, nausea and vomiting.       She had a bowel movement this am  Genitourinary: Negative for dysuria, flank pain, frequency, hematuria and pelvic pain.  Musculoskeletal: Positive for arthralgias, back pain and gait problem.  Skin: Negative for rash.  Neurological: Negative for dizziness, numbness and headaches.  Psychiatric/Behavioral: Negative for behavioral problems and confusion.    Vitals:   01/21/18 1433  BP: 138/70  Pulse: 66  Resp: 18  Temp: 98.3 F (36.8 C)  TempSrc: Oral  SpO2: 93%  Weight: 149 lb 9.6 oz (67.9 kg)  Height: 5' 5"  (1.651 m)   Body mass index is 24.89 kg/m.   Wt Readings from Last 3 Encounters:  01/21/18 149 lb 9.6 oz (67.9 kg)  01/16/18 151 lb (68.5 kg)  01/09/18  155 lb 9.6 oz (70.6 kg)   Physical Exam  Constitutional: She is oriented to person, place, and time. She appears well-developed and well-nourished. No distress.  frail  HENT:  Head: Normocephalic and atraumatic.  Mouth/Throat: Oropharynx is clear and moist.  Eyes: Conjunctivae and EOM are normal. Pupils are equal, round, and reactive to light. Right eye exhibits no discharge. Left eye exhibits no discharge.  Neck: Normal range of motion. Neck supple.  Cardiovascular: Normal rate and regular rhythm.  Murmur heard. Pulmonary/Chest: Effort normal and breath sounds normal. No respiratory distress. She has no wheezes. She has no rales.  Abdominal: Soft. Bowel sounds are normal. There is no tenderness.  Musculoskeletal: She exhibits edema.  No reproducible spinal tenderness, limited ROM to left hip, unsteady gait  Lymphadenopathy:    She has no cervical adenopathy.  Neurological: She is alert and oriented to person, place, and time.  Skin: Skin is warm and dry. No rash noted. She is not diaphoretic.  Psychiatric: She has a normal mood and affect.    Labs reviewed: Basic Metabolic Panel: Recent Labs    11/05/17 0346  12/05/17 12/24/17 0700 01/16/18 0842  NA 135   < > 133* 135 136  K 3.9   < > 4.0 3.9 3.5  CL 109  --   --  97* 101  CO2 22  --   --  31 29  GLUCOSE 119*  --   --  79 99  BUN 34*   < > 18 15 18   CREATININE 0.[name]   < > 0.7 0.78 0.70  CALCIUM 8.2*  --   --  10.0 9.8   < > = values in this interval not displayed.   Liver Function Tests: Recent Labs    07/24/17 0931 08/21/17 0000 11/03/17 1818 11/19/17 12/05/17 12/24/17 0700  AST 17  17 16 20 18 23 17  17   ALT 11  11 14  13* 12 20 12  12   ALKPHOS 90  90  --  78 74 97  --   BILITOT 0.4  0.4 0.3 0.4  --   --  0.4  0.4  PROT 7.0  7.0 7.0 6.5  --   --  7.2  7.2  ALBUMIN 3.6  3.6  --  3.1*  --   --   --    No results for input(s): LIPASE, AMYLASE in the last 8760 hours. No results for input(s): AMMONIA in  the last 8760 hours. CBC: Recent Labs    11/15/17 0357  12/05/17 12/24/17 0700 01/16/18 0842  WBC 9.6   < > 7.2 6.5 10.3  HGB 9.7*   < > 8.9* 10.7* 11.0*  HCT 29.9*   < > 26* 31.9* 32.7*  MCV 88.7  --   --  86.4 88.4  PLT 270   < > 404* 389 278   < > = values in this interval not displayed.   Cardiac Enzymes: No results for input(s): CKTOTAL, CKMB, CKMBINDEX, TROPONINI in the last 8760 hours. BNP: Invalid input(s): POCBNP Lab Results  Component Value Date   HGBA1C 5.3 04/16/2017   Lab Results  Component Value Date   TSH 2.00 11/20/2017   Lab Results  Component Value Date   [id number] 270 09/10/2011   Lab Results  Component Value Date   FOLATE >20.0 09/10/2011   Lab Results  Component Value Date   IRON 36 (L) 12/24/2017   TIBC 248 (L) 09/10/2011  FERRITIN 251 12/24/2017    Imaging and Procedures obtained prior to SNF admission: Dg Chest 2 View  Result Date: 01/16/2018 CLINICAL DATA:  Weakness and shortness of breath EXAM: CHEST  2 VIEW COMPARISON:  06/14/2015 FINDINGS: Cardiac shadow is within normal limits. The lungs are well aerated bilaterally. No focal infiltrate or sizable effusion is seen. Chronic blunting of left costophrenic angle is noted. No new focal abnormality is seen. No acute bony abnormality is noted. IMPRESSION: Chronic changes in the left base.  No acute abnormality noted. Electronically Signed   By: Inez Catalina M.D.   On: 01/16/2018 10:23   Dg Lumbar Spine Complete  Result Date: 01/16/2018 CLINICAL DATA:  Pt from SNF with fall x 10 days ago complains of bilat hip, low back pain worsening since fall EXAM: LUMBAR SPINE - COMPLETE 4+ VIEW COMPARISON:  PET-CT 01/30/2010 FINDINGS: Six lumbar type vertebral bodies, numbering based on previous CT exam from 11/17/2009. There is moderate degenerative change in the mid and lower lumbar levels. No acute fracture. There is 7 millimeters anterolisthesis of L4 on L5. there is 7 millimeters anterolisthesis of L3  on L4. There is 5 millimeters anterolisthesis of L2 on L3. IMPRESSION: 1. No acute fracture. 2. Grade 1 anterolisthesis at L2-3, L3-4, and L4-5. Electronically Signed   By: Nolon Nations M.D.   On: 01/16/2018 08:46   Dg Hips Bilat W Or Wo Pelvis 5 Views  Result Date: 01/16/2018 CLINICAL DATA:  Pt from SNF with fall x 10 days ago complains of bilat hip, low back pain worsening since fall EXAM: DG HIP (WITH OR WITHOUT PELVIS) 5+V BILAT COMPARISON:  PET-CT 01/30/2010, abdominal CT 02/17/2008 FINDINGS: The patient has had bilateral hip arthroplasty. There is deformity left superior and inferior pubic rami, consistent with fractures of indeterminate age. The appearance is new since the previous CT exam of 2011 favors a chronic process over an acute or subacute fracture. There is significant degenerative change in the lower lumbar spine and both SI joints. There is atherosclerotic calcification of the femoral arteries. No evidence for loosening of the hardware. IMPRESSION: 1. Deformity of the left superior and inferior pubic rami, favoring chronic process. However, the abnormality is new since most recent comparison of 2011. 2. No evidence for dislocation of either hip arthroplasty. Electronically Signed   By: Nolon Nations M.D.   On: 01/16/2018 08:42    Assessment/Plan  Generalized weakness With deconditioning. Encourage her to work with therapy team, out of bed as tolerated, supportive care  Unsteady gait Will have her work with physical therapy and occupational therapy team to help with gait training and muscle strengthening exercises.fall precautions. Skin care. Encourage to be out of bed.   Left hip pain Concern for osteoarthritis, subacute to chronic pubic rami deformity/ ? Fracture and deconditioning contributing to her pain. Currently on tylenol 500 mg q6h prn pain. Continue cymbalta and gabapentin.therapy as above.  Low back pain Denies radiation to legs or feet. Seen by orthopedic in ED  and currently on gabapentin 100 mg tid. Change tylenol to 1000 mg TID for now and monitor. Will have patient work with PT/OT as tolerated to regain strength and restore function.  Fall precautions are in place.  Hypertension C/w atenolol 25 mg daily, monitor BP  PND Continue cetirizine  Iron def anemia Continue ferrous gluconate, check ferritin level in 3 months  gerd C/w omeprazole  Of note, patient had infectious workup in ED for generalized weakness. U/a with c/s was sent and has  resulted positive for klebsiella. Patient is afebrile, has normal wbc, no symptoms of UTI. Thus, monitor clinically. Hold off on antibiotics.   Family/ staff Communication: reviewed care plan with patient and charge nurse.    Labs/tests ordered: cbc with diff, cmp  Blanchie Serve, MD Internal Medicine [facility] [address] [place], [place] [address] Cell Phone (Monday-Friday 8 am - 5 pm): [phone] On Call: [phone] and follow prompts after 5 pm and on weekends Office Phone: [phone] Office Fax: [phone]

## 2018-01-23 ENCOUNTER — Non-Acute Institutional Stay (SKILLED_NURSING_FACILITY): Payer: Medicare HMO | Admitting: Nurse Practitioner

## 2018-01-23 ENCOUNTER — Encounter: Payer: Self-pay | Admitting: Nurse Practitioner

## 2018-01-23 DIAGNOSIS — M25552 Pain in left hip: Secondary | ICD-10-CM

## 2018-01-23 DIAGNOSIS — M545 Low back pain, unspecified: Secondary | ICD-10-CM

## 2018-01-23 LAB — BASIC METABOLIC PANEL
BUN: 19 (ref 4–21)
Creatinine: 0.7 (ref <=1.1)
Glucose: 79
Potassium: 3.8 (ref 3.4–5.3)
SODIUM: 138 (ref 137–147)

## 2018-01-23 LAB — CBC AND DIFFERENTIAL
HEMATOCRIT: 32 — AB (ref 36–46)
HEMOGLOBIN: 10.9 — AB (ref 12.0–16.0)
PLATELETS: 369 (ref 150–399)
WBC: 7

## 2018-01-23 LAB — HEPATIC FUNCTION PANEL
ALK PHOS: 217 — AB (ref 25–125)
ALT: 16 (ref 7–35)
AST: 23 (ref 13–35)
BILIRUBIN, TOTAL: 0.3

## 2018-01-23 NOTE — Progress Notes (Signed)
Location:  [facility] Room Number: 17 Place of Service:  SNF (31) Provider:  **Mast, Manxie  NP  Blanchie Serve, MD  Patient Care Team: Blanchie Serve, MD as PCP - General (Internal Medicine) Lanelle Bal., MD as Referring Physician (Internal Medicine) Satira Sark, MD as Consulting Physician (Cardiology) Kyung Rudd, MD as Consulting Physician (Radiation Oncology)  Extended Emergency Contact Information Primary Emergency Contact: Neita Garnet of [place] Phone: [phone] Relation: Son Secondary Emergency Contact: Loren,Lance Address: [address]          [place], [place] [address] [name] of [place] Phone: [phone] Mobile Phone: [phone] Relation: Son  Code Status: DNR Goals of care: Advanced Directive information Advanced Directives 01/23/2018  Does Patient Have a Medical Advance Directive? Yes  Type of Advance Directive Living will;Out of facility DNR (pink MOST or yellow form)  Does patient want to make changes to medical advance directive? No - Patient declined  Copy of [place] in Chart? -  Would patient like information on creating a medical advance directive? No - Patient declined  Pre-existing out of facility DNR order (yellow form or pink MOST form) Pink MOST form placed in chart (order not valid for inpatient use)     Chief Complaint  Patient presents with  . Acute Visit    Pain management    HPI:  Pt is a 84 y.o. female seen today for an acute visit for persisted left lower back and hip region pain, worsens when sitting up or lean forward, unable to stand. Stated Gabapentin may helped with pain initially, but  Tylenol 1039m tid has very little efficacy, unable to participate in PT, she desires better pain control/comfort, she told me she tolerated Norco well in the past. Hx of R+L hip arthroplasty. ED evaluation 01/16/18 for the left lower back and hip pain, imaging  showed indeterminate age of the left superior and inferior pubic ramus fractures.    Past Medical History:  Diagnosis Date  . Allergic rhinitis   . Anemia    NOS  . Anxiety   . Biceps tendon rupture    Bilateral  . Breast cancer ([facility] 2001   Left s/p lumpectomy and XRT   . Bronchiectasis ([facility] 2014   Noted on chest x-ray  . Complication of anesthesia    slow to wake up  . Cough 2014  . Depression   . Diverticulosis   . Elevated liver enzymes    Biopsy consistent with steatohepatitis  . Fundic gland polyps of stomach, benign   . GERD (gastroesophageal reflux disease) 06/2007   EGD Dr RGala Romney>sm HH, multiple fundic gland polyps  . Glaucoma 2013   both eyes  . Hemorrhoids   . Hiatal hernia   . Hx of radiation therapy 07/17/10 to 07/21/10   SRS LLL lung  . Hyperlipidemia   . Insomnia   . Low back pain    scoliosis  . Lung cancer ([facility] 05/2010   Left 2011 - rad x 3  . Lymphedema    Left arm  . Osteoarthritis   . Osteopenia 10/25/2016  . Osteoporosis, senile   . Overactive bladder   . Pneumonia    RLL with sepsis  . PSVT (paroxysmal supraventricular tachycardia) ([facility]   . Rheumatic fever    Age 84 . Right thyroid nodule   . Steatohepatitis    liver bx  . Type 2 diabetes mellitus ([facility]    Past Surgical  History:  Procedure Laterality Date  . ABDOMINAL HYSTERECTOMY  1964  . APPENDECTOMY  1964  . BIOPSY THYROID  11/2008  . BREAST BIOPSY     X 3 w/ cystectomy  . BREAST LUMPECTOMY  2001  . CATARACT EXTRACTION Bilateral 2003   Lens implant Dr. Charise Killian  . CHOLECYSTECTOMY  1965  . COLONOSCOPY  09/11/2011   Procedure: COLONOSCOPY;  Surgeon: Dorothyann Peng, MD;  [facility];  Service: Endoscopy;  Laterality: N/A;  . COLONOSCOPY  2005 /2012   Dr. Lucianne Muss- diverticulosis, ext hemorrhoids.  . COLONOSCOPY WITH PROPOFOL N/A 11/09/2017   Procedure: COLONOSCOPY WITH PROPOFOL;  Surgeon: Mauri Pole, MD;  [facility]: [facility];  Service: Endoscopy;  Laterality: N/A;   . COLONOSCOPY WITH PROPOFOL N/A 11/12/2017   Procedure: COLONOSCOPY WITH PROPOFOL;  Surgeon: Ladene Artist, MD;  Location: [facility];  Service: Endoscopy;  Laterality: N/A;  . CYSTOSCOPY  2007  . ESOPHAGOGASTRODUODENOSCOPY  09/11/2011   Procedure: ESOPHAGOGASTRODUODENOSCOPY (EGD);  Surgeon: Dorothyann Peng, MD;  [facility];  Service: Endoscopy;  Laterality: N/A;  . ESOPHAGOGASTRODUODENOSCOPY (EGD) WITH PROPOFOL N/A 11/08/2017   Procedure: ESOPHAGOGASTRODUODENOSCOPY (EGD) WITH PROPOFOL;  Surgeon: Mauri Pole, MD;  Location: [facility];  Service: Endoscopy;  Laterality: N/A;  . LIVER BIOPSY  2008  . LUNG BIOPSY Left 06/06/2010  . REVISION TOTAL HIP ARTHROPLASTY  2007   Left  . SKIN CANCER EXCISION     nose and left lower cheek  . TONSILLECTOMY  1930  . TOTAL HIP ARTHROPLASTY Bilateral 2005 & 2007    Dr. Earl Lagos. Aline Brochure     Allergies  Allergen Reactions  . Augmentin [Amoxicillin-Pot Clavulanate] Other (See Comments)    unknown  . Ibuprofen Hives  . Naproxen Other (See Comments)    unknown  . Statins Other (See Comments)    Feels like knives in stomach  . Surgilube [Gyne-Moistrin]     Rectal itching, burning  . Tape Rash    Outpatient Encounter Medications as of 01/23/2018  Medication Sig  . atenolol (TENORMIN) 25 MG tablet Take 25 mg by mouth daily.  . cetirizine (ZYRTEC) 10 MG tablet Take 10 mg by mouth daily.  . dorzolamide-timolol (COSOPT) 22.3-6.8 MG/ML ophthalmic solution Place 1 drop into both eyes 2 (two) times daily.   . DULoxetine (CYMBALTA) 20 MG capsule TAKE (1) CAPSULE DAILY.  Marland Kitchen Ferrous Sulfate 27 MG TABS Take 1 tablet by mouth daily.  . folic acid (FOLVITE) 409 MCG tablet Take 400 mcg by mouth daily.  Marland Kitchen gabapentin (NEURONTIN) 100 MG capsule Take 1 capsule (100 mg total) by mouth 3 (three) times daily.  . hydrocortisone (ANUSOL-HC) 2.5 % rectal cream Place 1 application rectally 2 (two) times daily.  Marland Kitchen lactose free nutrition (BOOST)  LIQD Take 237 mLs by mouth 2 (two) times daily between meals.  . latanoprost (XALATAN) 0.005 % ophthalmic solution Place 1 drop into both eyes at bedtime.  . Multiple Vitamins-Minerals (PRESERVISION AREDS 2 PO) Take 1 capsule by mouth 2 (two) times daily.   Marland Kitchen omeprazole (PRILOSEC) 20 MG capsule TAKE 1 CAPSULE EVERY DAY.  . vitamin B-12 (CYANOCOBALAMIN) 1000 MCG tablet Take 1,000 mcg by mouth daily.  . [DISCONTINUED] acetaminophen (TYLENOL) 500 MG tablet Take 500 mg by mouth every 6 (six) hours as needed for mild pain.  . [DISCONTINUED] Ferrous Gluconate (IRON 27 PO) Take 1 tablet by mouth daily.    No facility-administered encounter medications on file as of 01/23/2018.     Review  of Systems  Constitutional: Positive for activity change, appetite change and fatigue. Negative for chills, diaphoresis and fever.  Respiratory: Negative for cough, chest tightness, shortness of breath and wheezing.   Cardiovascular: Negative for chest pain, palpitations and leg swelling.  Musculoskeletal: Positive for arthralgias, back pain and gait problem. Negative for joint swelling.  Skin: Negative for color change, pallor and wound.  Neurological: Negative for tremors, speech difficulty, weakness and headaches.  Psychiatric/Behavioral: Negative for agitation, behavioral problems and confusion. The patient is not nervous/anxious.     Immunization History  Administered Date(s) Administered  . Influenza Whole 09/22/2008, 09/16/2012, 09/30/2013  . Influenza, High Dose Seasonal PF 09/25/2017  . Influenza-Unspecified 10/02/2014, 09/15/2015, 09/27/2016  . Pneumococcal Polysaccharide-23 08/17/2006  . Td 12/17/2005   Pertinent  Health Maintenance Due  Topic Date Due  . FOOT EXAM  07/19/1931  . OPHTHALMOLOGY EXAM  07/19/1931  . PNA vac Low Risk Adult (2 of 2 - PCV13) 08/18/2007  . URINE MICROALBUMIN  04/30/2008  . HEMOGLOBIN A1C  10/17/2017  . INFLUENZA VACCINE  Completed  . DEXA SCAN  Completed   Fall Risk   09/19/2017 05/17/2016 04/20/2016 04/19/2016  Falls in the past year? No No No No   Functional Status Survey:    Vitals:   01/23/18 1231  BP: 120/80  Pulse: 70  Resp: 18  Temp: (!) 96.8 F (36 C)  SpO2: 95%  Weight: 149 lb 4.8 oz (67.7 kg)  Height: 5' 5"  (1.651 m)   Body mass index is 24.84 kg/m. Physical Exam  Constitutional: She is oriented to person, place, and time. She appears well-developed and well-nourished. No distress.  HENT:  Head: Normocephalic and atraumatic.  Eyes: EOM are normal. Pupils are equal, round, and reactive to light.  Neck: Normal range of motion. Neck supple.  Cardiovascular: Normal rate and regular rhythm.  Pulmonary/Chest: Effort normal and breath sounds normal. She has no wheezes. She has no rales.  Musculoskeletal: Normal range of motion. She exhibits tenderness. She exhibits no edema.  Pain in the left lower back and hip region, worsens with sitting up, lean forward, straight leg raise  Neurological: She is alert and oriented to person, place, and time. She exhibits normal muscle tone. Coordination normal.  Skin: She is not diaphoretic.  Psychiatric: She has a normal mood and affect. Her behavior is normal.    Labs reviewed: Recent Labs    11/05/17 0346  12/05/17 12/24/17 0700 01/16/18 0842  NA 135   < > 133* 135 136  K 3.9   < > 4.0 3.9 3.5  CL 109  --   --  97* 101  CO2 22  --   --  31 29  GLUCOSE 119*  --   --  79 99  BUN 34*   < > 18 15 18   CREATININE 0.84   < > 0.7 0.78 0.70  CALCIUM 8.2*  --   --  10.0 9.8   < > = values in this interval not displayed.   Recent Labs    07/24/17 0931 08/21/17 0000 11/03/17 1818 11/19/17 12/05/17 12/24/17 0700  AST 17  17 16 20 18 23 17  17   ALT 11  11 14  13* 12 20 12  12   ALKPHOS 90  90  --  78 74 97  --   BILITOT 0.4  0.4 0.3 0.4  --   --  0.4  0.4  PROT 7.0  7.0 7.0 6.5  --   --  7.2  7.2  ALBUMIN 3.6  3.6  --  3.1*  --   --   --    Recent Labs    11/15/17 0357  12/05/17  12/24/17 0700 01/16/18 0842  WBC 9.6   < > 7.2 6.5 10.3  HGB 9.7*   < > 8.9* 10.7* 11.0*  HCT 29.9*   < > 26* 31.9* 32.7*  MCV 88.7  --   --  86.4 88.4  PLT 270   < > 404* 389 278   < > = values in this interval not displayed.   Lab Results  Component Value Date   TSH 2.00 11/20/2017   Lab Results  Component Value Date   HGBA1C 5.3 04/16/2017   Lab Results  Component Value Date   CHOL 188 04/12/2016   HDL 62 04/12/2016   LDLCALC 102 04/12/2016   TRIG 122 04/12/2016   CHOLHDL 3.0 Ratio 06/07/2008    Significant Diagnostic Results in last 30 days:  Dg Chest 2 View  Result Date: 01/16/2018 CLINICAL DATA:  Weakness and shortness of breath EXAM: CHEST  2 VIEW COMPARISON:  06/14/2015 FINDINGS: Cardiac shadow is within normal limits. The lungs are well aerated bilaterally. No focal infiltrate or sizable effusion is seen. Chronic blunting of left costophrenic angle is noted. No new focal abnormality is seen. No acute bony abnormality is noted. IMPRESSION: Chronic changes in the left base.  No acute abnormality noted. Electronically Signed   By: Inez Catalina M.D.   On: 01/16/2018 10:23   Dg Lumbar Spine Complete  Result Date: 01/16/2018 CLINICAL DATA:  Pt from SNF with fall x 10 days ago complains of bilat hip, low back pain worsening since fall EXAM: LUMBAR SPINE - COMPLETE 4+ VIEW COMPARISON:  PET-CT 01/30/2010 FINDINGS: Six lumbar type vertebral bodies, numbering based on previous CT exam from 11/17/2009. There is moderate degenerative change in the mid and lower lumbar levels. No acute fracture. There is 7 millimeters anterolisthesis of L4 on L5. there is 7 millimeters anterolisthesis of L3 on L4. There is 5 millimeters anterolisthesis of L2 on L3. IMPRESSION: 1. No acute fracture. 2. Grade 1 anterolisthesis at L2-3, L3-4, and L4-5. Electronically Signed   By: Nolon Nations M.D.   On: 01/16/2018 08:46   Dg Hips Bilat W Or Wo Pelvis 5 Views  Result Date: 01/16/2018 CLINICAL DATA:   Pt from SNF with fall x 10 days ago complains of bilat hip, low back pain worsening since fall EXAM: DG HIP (WITH OR WITHOUT PELVIS) 5+V BILAT COMPARISON:  PET-CT 01/30/2010, abdominal CT 02/17/2008 FINDINGS: The patient has had bilateral hip arthroplasty. There is deformity left superior and inferior pubic rami, consistent with fractures of indeterminate age. The appearance is new since the previous CT exam of 2011 favors a chronic process over an acute or subacute fracture. There is significant degenerative change in the lower lumbar spine and both SI joints. There is atherosclerotic calcification of the femoral arteries. No evidence for loosening of the hardware. IMPRESSION: 1. Deformity of the left superior and inferior pubic rami, favoring chronic process. However, the abnormality is new since most recent comparison of 2011. 2. No evidence for dislocation of either hip arthroplasty. Electronically Signed   By: Nolon Nations M.D.   On: 01/16/2018 08:42    Assessment/Plan LOW BACK PAIN left lower back and hip region pain, worsens when sitting up or lean forward, unable to stand. Stated Gabapentin may helped with pain initially, but  Tylenol 1068m tid has very little  efficacy, unable to participate in PT, she desires better pain control/comfort, she told me she tolerated Norco well in the past. Hx of R+L hip arthroplasty. ED evaluation 01/16/18 for the left lower back and hip pain, imaging showed indeterminate age of the left superior and inferior pubic ramus fractures.  Will discontinue Tylenol 1018m tid, try hydrocodone/acetaminophen 5/3290mx1 now, then 1/2 tab bid for better pain control.   HIP PAIN Left hip pain, worsens when sitting up, lean forward, straight leg raise. She requires one person assist transfer, hardly to ambulate with walker without excessive pain. Will start Norco 5/32539m/2 bid for now, may consider Ortho eval if no better.      Family/ staff Communication: plan of care  reviewed with the patient and charge nurse.    Labs/tests ordered:  None  Time spend 25 minutes.

## 2018-01-23 NOTE — Assessment & Plan Note (Signed)
left lower back and hip region pain, worsens when sitting up or lean forward, unable to stand. Stated Gabapentin may helped with pain initially, but  Tylenol 1082m tid has very little efficacy, unable to participate in PT, she desires better pain control/comfort, she told me she tolerated Norco well in the past. Hx of R+L hip arthroplasty. ED evaluation 01/16/18 for the left lower back and hip pain, imaging showed indeterminate age of the left superior and inferior pubic ramus fractures.  Will discontinue Tylenol 10045mtid, try hydrocodone/acetaminophen 5/32562m1 now, then 1/2 tab bid for better pain control.

## 2018-01-23 NOTE — Assessment & Plan Note (Signed)
Left hip pain, worsens when sitting up, lean forward, straight leg raise. She requires one person assist transfer, hardly to ambulate with walker without excessive pain. Will start Norco 5/372m 1/2 bid for now, may consider Ortho eval if no better.

## 2018-01-24 ENCOUNTER — Other Ambulatory Visit: Payer: Self-pay | Admitting: *Deleted

## 2018-01-30 ENCOUNTER — Encounter: Payer: Self-pay | Admitting: Internal Medicine

## 2018-01-30 ENCOUNTER — Non-Acute Institutional Stay (SKILLED_NURSING_FACILITY): Payer: Medicare HMO | Admitting: Internal Medicine

## 2018-01-30 DIAGNOSIS — M546 Pain in thoracic spine: Secondary | ICD-10-CM | POA: Diagnosis not present

## 2018-01-30 DIAGNOSIS — R69 Illness, unspecified: Secondary | ICD-10-CM | POA: Diagnosis not present

## 2018-01-30 DIAGNOSIS — K5900 Constipation, unspecified: Secondary | ICD-10-CM

## 2018-01-30 DIAGNOSIS — M545 Low back pain, unspecified: Secondary | ICD-10-CM

## 2018-01-30 DIAGNOSIS — G8929 Other chronic pain: Secondary | ICD-10-CM

## 2018-01-30 DIAGNOSIS — F419 Anxiety disorder, unspecified: Secondary | ICD-10-CM

## 2018-01-30 DIAGNOSIS — R1031 Right lower quadrant pain: Secondary | ICD-10-CM

## 2018-01-30 NOTE — Progress Notes (Signed)
Location:  [facility] Room Number: 17 Place of Service:  SNF (31) Provider:  Blanchie Serve, MD  Blanchie Serve, MD  Patient Care Team: Blanchie Serve, MD as PCP - General (Internal Medicine) Lanelle Bal., MD as Referring Physician (Internal Medicine) Satira Sark, MD as Consulting Physician (Cardiology) Kyung Rudd, MD as Consulting Physician (Radiation Oncology)  Extended Emergency Contact Information Primary Emergency Contact: Neita Garnet of [place] Phone: [phone] Relation: Son Secondary Emergency Contact: Minner,Lance Address: [address]          [place], [place] [address] [name] of [place] Phone: [phone] Mobile Phone: [phone] Relation: Son  Code Status:  DNR  Goals of care: Advanced Directive information Advanced Directives 01/30/2018  Does Patient Have a Medical Advance Directive? Yes  Type of Advance Directive Living will;Out of facility DNR (pink MOST or yellow form)  Does patient want to make changes to medical advance directive? No - Patient declined  Copy of [place] in Chart? -  Would patient like information on creating a medical advance directive? -  Pre-existing out of facility DNR order (yellow form or pink MOST form) Pink MOST form placed in chart (order not valid for inpatient use)     Chief Complaint  Patient presents with  . Acute Visit    Constipation and pain management    HPI:  Pt is a 84 y.o. female seen today for an acute visit for following reasons.  Pain- she has pain to left posterior mid-back area, left hip and lower back area and right groin area. The left mid back area pain is new and she feels this to have started after being weak while trying to stand with help of gait belt. Pain to left lower back, hip and right groin area has been there for few weeks now. Currently on norco 5-325 mg half tablet tid with gabapentin 100 mg tid. Pain is  more prominent now, worsens with movement, no relieving factor, dull ache. Denies radiation of pain. Denies any numbness or tingling to her extremities. Of note, she had a fall and was in ED on 01/16/18 and imaging showed ? Left superior and inferior pubic ramus deformity consistent with fracture of indeterminate age. No recent new fall reported. Pain limiting her participation in therapy.   Constipation- had not had bowel movement for several days. She was manually disimpacted today and had a large bowel movement. Mentions taking miralax half dosing as needed at home with help.    Past Medical History:  Diagnosis Date  . Allergic rhinitis   . Anemia    NOS  . Anxiety   . Biceps tendon rupture    Bilateral  . Breast cancer ([facility]) 2001   Left s/p lumpectomy and XRT   . Bronchiectasis ([facility]) 2014   Noted on chest x-ray  . Complication of anesthesia    slow to wake up  . Cough 2014  . Depression   . Diverticulosis   . Elevated liver enzymes    Biopsy consistent with steatohepatitis  . Fundic gland polyps of stomach, benign   . GERD (gastroesophageal reflux disease) 06/2007   EGD Dr Gala Romney >sm HH, multiple fundic gland polyps  . Glaucoma 2013   both eyes  . Hemorrhoids   . Hiatal hernia   . Hx of radiation therapy 07/17/10 to 07/21/10   SRS LLL lung  . Hyperlipidemia   . Insomnia   . Low back pain  scoliosis  . Lung cancer ([facility]) 05/2010   Left 2011 - rad x 3  . Lymphedema    Left arm  . Osteoarthritis   . Osteopenia 10/25/2016  . Osteoporosis, senile   . Overactive bladder   . Pneumonia    RLL with sepsis  . PSVT (paroxysmal supraventricular tachycardia) ([facility])   . Rheumatic fever    Age 53  . Right thyroid nodule   . Steatohepatitis    liver bx  . Type 2 diabetes mellitus ([facility])    Past Surgical History:  Procedure Laterality Date  . ABDOMINAL HYSTERECTOMY  1964  . APPENDECTOMY  1964  . BIOPSY THYROID  11/2008  . BREAST BIOPSY     X 3 w/ cystectomy  . BREAST LUMPECTOMY   2001  . CATARACT EXTRACTION Bilateral 2003   Lens implant Dr. Charise Killian  . CHOLECYSTECTOMY  1965  . COLONOSCOPY  09/11/2011   Procedure: COLONOSCOPY;  Surgeon: Dorothyann Peng, MD;  [facility];  Service: Endoscopy;  Laterality: N/A;  . COLONOSCOPY  2005 /2012   Dr. Lucianne Muss- diverticulosis, ext hemorrhoids.  . COLONOSCOPY WITH PROPOFOL N/A 11/09/2017   Procedure: COLONOSCOPY WITH PROPOFOL;  Surgeon: Mauri Pole, MD;  Location: [facility];  Service: Endoscopy;  Laterality: N/A;  . COLONOSCOPY WITH PROPOFOL N/A 11/12/2017   Procedure: COLONOSCOPY WITH PROPOFOL;  Surgeon: Ladene Artist, MD;  Location: [facility];  Service: Endoscopy;  Laterality: N/A;  . CYSTOSCOPY  2007  . ESOPHAGOGASTRODUODENOSCOPY  09/11/2011   Procedure: ESOPHAGOGASTRODUODENOSCOPY (EGD);  Surgeon: Dorothyann Peng, MD;  [facility];  Service: Endoscopy;  Laterality: N/A;  . ESOPHAGOGASTRODUODENOSCOPY (EGD) WITH PROPOFOL N/A 11/08/2017   Procedure: ESOPHAGOGASTRODUODENOSCOPY (EGD) WITH PROPOFOL;  Surgeon: Mauri Pole, MD;  [facility];  Service: Endoscopy;  Laterality: N/A;  . LIVER BIOPSY  2008  . LUNG BIOPSY Left 06/06/2010  . REVISION TOTAL HIP ARTHROPLASTY  2007   Left  . SKIN CANCER EXCISION     nose and left lower cheek  . TONSILLECTOMY  1930  . TOTAL HIP ARTHROPLASTY Bilateral 2005 & 2007    Dr. Earl Lagos. Aline Brochure     Allergies  Allergen Reactions  . Augmentin [Amoxicillin-Pot Clavulanate] Other (See Comments)    unknown  . Ibuprofen Hives  . Naproxen Other (See Comments)    unknown  . Statins Other (See Comments)    Feels like knives in stomach  . Surgilube [Gyne-Moistrin]     Rectal itching, burning  . Tape Rash    Outpatient Encounter Medications as of 01/30/2018  Medication Sig  . acetaminophen (TYLENOL) 325 MG tablet Take 650 mg by mouth every 6 (six) hours as needed.  Marland Kitchen atenolol (TENORMIN) 25 MG tablet Take 25 mg by mouth daily.  . cetirizine  (ZYRTEC) 10 MG tablet Take 10 mg by mouth daily.  . dorzolamide-timolol (COSOPT) 22.3-6.8 MG/ML ophthalmic solution Place 1 drop into both eyes 2 (two) times daily.   . DULoxetine (CYMBALTA) 20 MG capsule TAKE (1) CAPSULE DAILY.  Marland Kitchen Ferrous Sulfate 27 MG TABS Take 1 tablet by mouth daily.  . folic acid (FOLVITE) 852 MCG tablet Take 400 mcg by mouth daily.  Marland Kitchen gabapentin (NEURONTIN) 100 MG capsule Take 1 capsule (100 mg total) by mouth 3 (three) times daily.  Marland Kitchen HYDROcodone-acetaminophen (NORCO/VICODIN) 5-325 MG tablet Take by mouth 3 (three) times daily. Give 1/2 tablet  . hydrocortisone (ANUSOL-HC) 2.5 % rectal cream Place 1 application rectally 2 (two) times daily.  Marland Kitchen lactose  free nutrition (BOOST) LIQD Take 237 mLs by mouth 2 (two) times daily between meals.  . latanoprost (XALATAN) 0.005 % ophthalmic solution Place 1 drop into both eyes at bedtime.  . magnesium hydroxide (MILK OF MAGNESIA) 400 MG/5ML suspension Take 30 mLs by mouth as needed for mild constipation.  . Multiple Vitamins-Minerals (PRESERVISION AREDS 2 PO) Take 1 capsule by mouth 2 (two) times daily.   Marland Kitchen omeprazole (PRILOSEC) 20 MG capsule TAKE 1 CAPSULE EVERY DAY.  . vitamin B-12 (CYANOCOBALAMIN) 1000 MCG tablet Take 1,000 mcg by mouth daily.   No facility-administered encounter medications on file as of 01/30/2018.     Review of Systems  Constitutional: Positive for fatigue. Negative for chills and fever.  Eyes: Negative for visual disturbance.  Respiratory: Negative for shortness of breath.   Cardiovascular: Negative for chest pain and palpitations.  Gastrointestinal: Negative for abdominal pain, nausea and vomiting.  Musculoskeletal: Positive for arthralgias, back pain and gait problem. Negative for joint swelling, myalgias and neck pain.  Neurological: Negative for dizziness and headaches.    Immunization History  Administered Date(s) Administered  . Influenza Whole 09/22/2008, 09/16/2012, 09/30/2013  . Influenza,  High Dose Seasonal PF 09/25/2017  . Influenza-Unspecified 10/02/2014, 09/15/2015, 09/27/2016  . Pneumococcal Polysaccharide-23 08/17/2006  . Td 12/17/2005   Pertinent  Health Maintenance Due  Topic Date Due  . FOOT EXAM  07/19/1931  . OPHTHALMOLOGY EXAM  07/19/1931  . PNA vac Low Risk Adult (2 of 2 - PCV13) 08/18/2007  . URINE MICROALBUMIN  04/30/2008  . HEMOGLOBIN A1C  10/17/2017  . INFLUENZA VACCINE  Completed  . DEXA SCAN  Completed   Fall Risk  09/19/2017 05/17/2016 04/20/2016 04/19/2016  Falls in the past year? No No No No   Functional Status Survey:    Vitals:   01/30/18 1104  BP: 136/72  Pulse: 68  Resp: 20  Temp: 97.7 F (36.5 C)  TempSrc: Oral  SpO2: 94%  Weight: 151 lb 1.6 oz (68.5 kg)  Height: 5' 5"  (1.651 m)   Body mass index is 25.14 kg/m.   Wt Readings from Last 3 Encounters:  01/30/18 151 lb 1.6 oz (68.5 kg)  01/23/18 149 lb 4.8 oz (67.7 kg)  01/21/18 149 lb 9.6 oz (67.9 kg)   Physical Exam  Constitutional: She is oriented to person, place, and time. She appears well-developed and well-nourished. No distress.  HENT:  Head: Normocephalic and atraumatic.  Neck: Neck supple.  Abdominal: Soft. Bowel sounds are normal.  Musculoskeletal: She exhibits no edema.  Able to move all 4 extremities in sitting position, no spinal tenderness, left posterior thoracic paraspinal area tenderness and intercostal tenderness present, no muscle spasticity noted. Ambulates few steps with walker and needs to be wheeled in wheelchair from bathroom to room. ROM with both hip limited left > right  Lymphadenopathy:    She has no cervical adenopathy.  Neurological: She is alert and oriented to person, place, and time.  Skin: Skin is warm and dry. She is not diaphoretic.  Psychiatric:  Appears somewhat anxious, poor eye contact    Labs reviewed: Recent Labs    11/05/17 0346  12/24/17 0700 01/16/18 0842 01/23/18  NA 135   < > 135 136 138  K 3.9   < > 3.9 3.5 3.8  CL 109  --   97* 101  --   CO2 22  --  31 29  --   GLUCOSE 119*  --  79 99  --   BUN 34*   < >  15 18 19   CREATININE 0.84   < > 0.78 0.70 0.7  CALCIUM 8.2*  --  10.0 9.8  --    < > = values in this interval not displayed.   Recent Labs    07/24/17 0931 08/21/17 0000 11/03/17 1818 11/19/17 12/05/17 12/24/17 0700 01/23/18  AST 17  17 16 20 18 23 17  17 23   ALT 11  11 14  13* 12 20 12  12 16   ALKPHOS 90  90  --  78 74 97  --  217*  BILITOT 0.4  0.4 0.3 0.4  --   --  0.4  0.4  --   PROT 7.0  7.0 7.0 6.5  --   --  7.2  7.2  --   ALBUMIN 3.6  3.6  --  3.1*  --   --   --   --    Recent Labs    11/15/17 0357  12/24/17 0700 01/16/18 0842 01/23/18  WBC 9.6   < > 6.5 10.3 7.0  HGB 9.7*   < > 10.7* 11.0* 10.9*  HCT 29.9*   < > 31.9* 32.7* 32*  MCV 88.7  --  86.4 88.4  --   PLT 270   < > 389 278 369   < > = values in this interval not displayed.   Lab Results  Component Value Date   TSH 2.00 11/20/2017   Lab Results  Component Value Date   HGBA1C 5.3 04/16/2017   Lab Results  Component Value Date   CHOL 188 04/12/2016   HDL 62 04/12/2016   LDLCALC 102 04/12/2016   TRIG 122 04/12/2016   CHOLHDL 3.0 Ratio 06/07/2008    Significant Diagnostic Results in last 30 days:  Dg Chest 2 View  Result Date: 01/16/2018 CLINICAL DATA:  Weakness and shortness of breath EXAM: CHEST  2 VIEW COMPARISON:  06/14/2015 FINDINGS: Cardiac shadow is within normal limits. The lungs are well aerated bilaterally. No focal infiltrate or sizable effusion is seen. Chronic blunting of left costophrenic angle is noted. No new focal abnormality is seen. No acute bony abnormality is noted. IMPRESSION: Chronic changes in the left base.  No acute abnormality noted. Electronically Signed   By: Inez Catalina M.D.   On: 01/16/2018 10:23   Dg Lumbar Spine Complete  Result Date: 01/16/2018 CLINICAL DATA:  Pt from SNF with fall x 10 days ago complains of bilat hip, low back pain worsening since fall EXAM: LUMBAR SPINE -  COMPLETE 4+ VIEW COMPARISON:  PET-CT 01/30/2010 FINDINGS: Six lumbar type vertebral bodies, numbering based on previous CT exam from 11/17/2009. There is moderate degenerative change in the mid and lower lumbar levels. No acute fracture. There is 7 millimeters anterolisthesis of L4 on L5. there is 7 millimeters anterolisthesis of L3 on L4. There is 5 millimeters anterolisthesis of L2 on L3. IMPRESSION: 1. No acute fracture. 2. Grade 1 anterolisthesis at L2-3, L3-4, and L4-5. Electronically Signed   By: Nolon Nations M.D.   On: 01/16/2018 08:46   Dg Hips Bilat W Or Wo Pelvis 5 Views  Result Date: 01/16/2018 CLINICAL DATA:  Pt from SNF with fall x 10 days ago complains of bilat hip, low back pain worsening since fall EXAM: DG HIP (WITH OR WITHOUT PELVIS) 5+V BILAT COMPARISON:  PET-CT 01/30/2010, abdominal CT 02/17/2008 FINDINGS: The patient has had bilateral hip arthroplasty. There is deformity left superior and inferior pubic rami, consistent with fractures of indeterminate age. The appearance is new  since the previous CT exam of 2011 favors a chronic process over an acute or subacute fracture. There is significant degenerative change in the lower lumbar spine and both SI joints. There is atherosclerotic calcification of the femoral arteries. No evidence for loosening of the hardware. IMPRESSION: 1. Deformity of the left superior and inferior pubic rami, favoring chronic process. However, the abnormality is new since most recent comparison of 2011. 2. No evidence for dislocation of either hip arthroplasty. Electronically Signed   By: Nolon Nations M.D.   On: 01/16/2018 08:42    Assessment/Plan  Left mid back pain With pain to left posterior chest area. Obtain xray of thoracic, lumbar spine and left ribs to rule out fracture. Change norco from 1/2 tab tid to 5-325 mg tid. Add lidocaine patch for now. Also increase her cymbalta to 30 mg daily. Reassess in 1 week.   Left low back pain Without  sciatica. See above. Will have patient work with PT/OT as tolerated to regain strength and restore function.  Fall precautions are in place.  Right groin pain Has DDD of lumbar spine with grade 1 anterolisthesis at different levels. This could be contributing to the pain. See pain management as above. Continue current regimen of gabapentin  Constipation Start miralax 17 g half a dose every day to help with constipation. Hydration encouraged.   Anxiety Likely situational anxiety and depression. Patient denies being depressed but has poor eye contact and repeats herself several times. She appears anxious throughout the visit. Currently on cmbalta 20 mg daily, increase this to 30 mg daily for now and monitor.    Family/ staff Communication: reviewed care plan with patient, her daughter in Sports coach and charge nurse.    Labs/tests ordered:  Xray lumbar and thoracic spine, xray left ribs   I spent 40 minutes in total face-to-face time with the patient, more than 50% of which was spent in counseling and coordination of care, reviewing test results, reviewing medication and discussing or reviewing the diagnosis and treatment plan with patient and her daughter in law    Blanchie Serve, MD Internal Medicine [place], [place] [address] Cell Phone (Monday-Friday 8 am - 5 pm): [phone] On Call: [phone] and follow prompts after 5 pm and on weekends Office Phone: [phone] Office Fax: [phone]

## 2018-02-06 ENCOUNTER — Non-Acute Institutional Stay (SKILLED_NURSING_FACILITY): Payer: Medicare HMO | Admitting: Internal Medicine

## 2018-02-06 ENCOUNTER — Encounter: Payer: Self-pay | Admitting: Internal Medicine

## 2018-02-06 DIAGNOSIS — M545 Low back pain, unspecified: Secondary | ICD-10-CM

## 2018-02-06 DIAGNOSIS — K5909 Other constipation: Secondary | ICD-10-CM | POA: Diagnosis not present

## 2018-02-06 DIAGNOSIS — R5383 Other fatigue: Secondary | ICD-10-CM

## 2018-02-06 NOTE — Progress Notes (Signed)
Location:  [place] Room Number: 17 Place of Service:  SNF (31) Provider:  Blanchie Serve, MD  Blanchie Serve, MD  Patient Care Team: Blanchie Serve, MD as PCP - General (Internal Medicine) Lanelle Bal., MD as Referring Physician (Internal Medicine) Satira Sark, MD as Consulting Physician (Cardiology) Kyung Rudd, MD as Consulting Physician (Radiation Oncology)  Extended Emergency Contact Information Primary Emergency Contact: Neita Garnet of [place] Phone: [phone] Relation: Son Secondary Emergency Contact: Anfinson,Lance Address: [address]          [place], [place] [address] [name] of [place] Phone: [phone] Mobile Phone: [phone] Relation: Son  Code Status:  DNR  Goals of care: Advanced Directive information Advanced Directives 01/30/2018  Does Patient Have a Medical Advance Directive? Yes  Type of Advance Directive Living will;Out of facility DNR (pink MOST or yellow form)  Does patient want to make changes to medical advance directive? No - Patient declined  Copy of [place] in Chart? -  Would patient like information on creating a medical advance directive? -  Pre-existing out of facility DNR order (yellow form or pink MOST form) Pink MOST form placed in chart (order not valid for inpatient use)     Chief Complaint  Patient presents with  . Acute Visit    Pain    HPI:  Pt is a 84 y.o. female seen today for an acute visit for follow up on her pain to left posterior mid-back area, left hip and lower back area and right groin area. Reviewed xray ordered last week showing no acute abnormalities, stenosis of L2-3, L3-4 and L5-S1 noted. Current pain regimen has helped with her pain but she feels to be more sleepy with some feeling of decreased sensation to her hands with poorer grip for last few days. Yesterday she herself cut back on dose of her norco and today feels  somewhat better. She would like her pain medication adjsuted. She had been constipated and took additional dosing of miralax last night and this am has had about 3 loose stool. Mood fair at present.    Past Medical History:  Diagnosis Date  . Allergic rhinitis   . Anemia    NOS  . Anxiety   . Biceps tendon rupture    Bilateral  . Breast cancer ([place]) 2001   Left s/p lumpectomy and XRT   . Bronchiectasis ([place]) 2014   Noted on chest x-ray  . Complication of anesthesia    slow to wake up  . Cough 2014  . Depression   . Diverticulosis   . Elevated liver enzymes    Biopsy consistent with steatohepatitis  . Fundic gland polyps of stomach, benign   . GERD (gastroesophageal reflux disease) 06/2007   EGD Dr Gala Romney >sm HH, multiple fundic gland polyps  . Glaucoma 2013   both eyes  . Hemorrhoids   . Hiatal hernia   . Hx of radiation therapy 07/17/10 to 07/21/10   SRS LLL lung  . Hyperlipidemia   . Insomnia   . Low back pain    scoliosis  . Lung cancer ([facility] [facility]) 05/2010   Left 2011 - rad x 3  . Lymphedema    Left arm  . Osteoarthritis   . Osteopenia 10/25/2016  . Osteoporosis, senile   . Overactive bladder   . Pneumonia    RLL with sepsis  . PSVT (paroxysmal supraventricular tachycardia) ([facility])   . Rheumatic fever  Age 21  . Right thyroid nodule   . Steatohepatitis    liver bx  . Type 2 diabetes mellitus ([place])    Past Surgical History:  Procedure Laterality Date  . ABDOMINAL HYSTERECTOMY  1964  . APPENDECTOMY  1964  . BIOPSY THYROID  11/2008  . BREAST BIOPSY     X 3 w/ cystectomy  . BREAST LUMPECTOMY  2001  . CATARACT EXTRACTION Bilateral 2003   Lens implant Dr. Charise Killian  . CHOLECYSTECTOMY  1965  . COLONOSCOPY  09/11/2011   Procedure: COLONOSCOPY;  Surgeon: Dorothyann Peng, MD;  [facility];  Service: Endoscopy;  Laterality: N/A;  . COLONOSCOPY  2005 /2012   Dr. Lucianne Muss- diverticulosis, ext hemorrhoids.  . COLONOSCOPY WITH PROPOFOL N/A 11/09/2017   Procedure:  COLONOSCOPY WITH PROPOFOL;  Surgeon: Mauri Pole, MD;  Location: [facility];  Service: Endoscopy;  Laterality: N/A;  . COLONOSCOPY WITH PROPOFOL N/A 11/12/2017   Procedure: COLONOSCOPY WITH PROPOFOL;  Surgeon: Ladene Artist, MD;  Location: [facility];  Service: Endoscopy;  Laterality: N/A;  . CYSTOSCOPY  2007  . ESOPHAGOGASTRODUODENOSCOPY  09/11/2011   Procedure: ESOPHAGOGASTRODUODENOSCOPY (EGD);  Surgeon: Dorothyann Peng, MD;  [facility];  Service: Endoscopy;  Laterality: N/A;  . ESOPHAGOGASTRODUODENOSCOPY (EGD) WITH PROPOFOL N/A 11/08/2017   Procedure: ESOPHAGOGASTRODUODENOSCOPY (EGD) WITH PROPOFOL;  Surgeon: Mauri Pole, MD;  Location[facility];  Service: Endoscopy;  Laterality: N/A;  . LIVER BIOPSY  2008  . LUNG BIOPSY Left 06/06/2010  . REVISION TOTAL HIP ARTHROPLASTY  2007   Left  . SKIN CANCER EXCISION     nose and left lower cheek  . TONSILLECTOMY  1930  . TOTAL HIP ARTHROPLASTY Bilateral 2005 & 2007    Dr. Earl Lagos. Aline Brochure     Allergies  Allergen Reactions  . Augmentin [Amoxicillin-Pot Clavulanate] Other (See Comments)    unknown  . Ibuprofen Hives  . Naproxen Other (See Comments)    unknown  . Statins Other (See Comments)    Feels like knives in stomach  . Surgilube [Gyne-Moistrin]     Rectal itching, burning  . Tape Rash    Outpatient Encounter Medications as of 02/06/2018  Medication Sig  . acetaminophen (TYLENOL) 325 MG tablet Take 650 mg by mouth every 4 (four) hours as needed.   Marland Kitchen atenolol (TENORMIN) 25 MG tablet Take 25 mg by mouth daily.  . cetirizine (ZYRTEC) 10 MG tablet Take 10 mg by mouth daily.  . dorzolamide-timolol (COSOPT) 22.3-6.8 MG/ML ophthalmic solution Place 1 drop into both eyes 2 (two) times daily.   . DULoxetine (CYMBALTA) 20 MG capsule TAKE (1) CAPSULE DAILY.  Marland Kitchen Ferrous Sulfate 27 MG TABS Take 1 tablet by mouth daily.  . folic acid (FOLVITE) 063 MCG tablet Take 400 mcg by mouth daily.  Marland Kitchen gabapentin  (NEURONTIN) 100 MG capsule Take 1 capsule (100 mg total) by mouth 3 (three) times daily.  Marland Kitchen HYDROcodone-acetaminophen (NORCO/VICODIN) 5-325 MG tablet Take 1 tablet by mouth 3 (three) times daily.   . hydrocortisone (ANUSOL-HC) 2.5 % rectal cream Place 1 application rectally 2 (two) times daily.  Marland Kitchen lactose free nutrition (BOOST) LIQD Take 237 mLs by mouth 2 (two) times daily between meals.  . latanoprost (XALATAN) 0.005 % ophthalmic solution Place 1 drop into both eyes at bedtime.  . lidocaine (LIDODERM) 5 % Place 1 patch onto the skin daily. Remove & Discard patch within 12 hours or as directed by MD  . Multiple Vitamins-Minerals (PRESERVISION AREDS 2 PO)  Take 1 capsule by mouth 2 (two) times daily.   Marland Kitchen omeprazole (PRILOSEC) 20 MG capsule TAKE 1 CAPSULE EVERY DAY.  Marland Kitchen polyethylene glycol (MIRALAX / GLYCOLAX) packet Take 17 g by mouth daily.  . vitamin B-12 (CYANOCOBALAMIN) 1000 MCG tablet Take 1,000 mcg by mouth daily.  . [DISCONTINUED] magnesium hydroxide (MILK OF MAGNESIA) 400 MG/5ML suspension Take 30 mLs by mouth as needed for mild constipation.   No facility-administered encounter medications on file as of 02/06/2018.     Review of Systems  Constitutional: Positive for fatigue. Negative for chills and fever.  Respiratory: Negative for shortness of breath.   Cardiovascular: Negative for chest pain.  Gastrointestinal: Positive for constipation. Negative for blood in stool, diarrhea, nausea and vomiting.  Musculoskeletal: Positive for back pain and gait problem.  Neurological: Positive for numbness. Negative for dizziness, tremors, seizures, syncope and headaches.  Psychiatric/Behavioral: Positive for dysphoric mood. Negative for behavioral problems and confusion.    Immunization History  Administered Date(s) Administered  . Influenza Whole 09/22/2008, 09/16/2012, 09/30/2013  . Influenza, High Dose Seasonal PF 09/25/2017  . Influenza-Unspecified 10/02/2014, 09/15/2015, 09/27/2016  .  Pneumococcal Polysaccharide-23 08/17/2006  . Td 12/17/2005   Pertinent  Health Maintenance Due  Topic Date Due  . FOOT EXAM  07/19/1931  . OPHTHALMOLOGY EXAM  07/19/1931  . PNA vac Low Risk Adult (2 of 2 - PCV13) 08/18/2007  . URINE MICROALBUMIN  04/30/2008  . HEMOGLOBIN A1C  10/17/2017  . INFLUENZA VACCINE  Completed  . DEXA SCAN  Completed   Fall Risk  09/19/2017 05/17/2016 04/20/2016 04/19/2016  Falls in the past year? No No No No   Functional Status Survey:    Vitals:   02/06/18 1649  BP: 136/72  Pulse: 68  Resp: 20  Temp: 97.7 F (36.5 C)  TempSrc: Oral  SpO2: 94%  Weight: 152 lb 3.2 oz (69 kg)  Height: 5' 5"  (1.651 m)   Body mass index is 25.33 kg/m. Physical Exam  Constitutional: She appears well-developed and well-nourished.  HENT:  Head: Normocephalic and atraumatic.  Mouth/Throat: Oropharynx is clear and moist. No oropharyngeal exudate.  Eyes: Conjunctivae and EOM are normal. Pupils are equal, round, and reactive to light.  Neck: Neck supple.  Abdominal: Soft. There is no tenderness.  Musculoskeletal:  Patient winces with pain with position change, no spinal tenderness, ROM limited to legs with pain  Skin: She is not diaphoretic.    Labs reviewed: Recent Labs    11/05/17 0346  12/24/17 0700 01/16/18 0842 01/23/18  NA 135   < > 135 136 138  K 3.9   < > 3.9 3.5 3.8  CL 109  --  97* 101  --   CO2 22  --  31 29  --   GLUCOSE 119*  --  79 99  --   BUN 34*   < > 15 18 19   CREATININE 0.84   < > 0.78 0.70 0.7  CALCIUM 8.2*  --  10.0 9.8  --    < > = values in this interval not displayed.   Recent Labs    07/24/17 0931 08/21/17 0000 11/03/17 1818 11/19/17 12/05/17 12/24/17 0700 01/23/18  AST 17  17 16 20 18 23 17  17 23   ALT 11  11 14  13* 12 20 12  12 16   ALKPHOS 90  90  --  78 74 97  --  217*  BILITOT 0.4  0.4 0.3 0.4  --   --  0.4  0.4  --   PROT 7.0  7.0 7.0 6.5  --   --  7.2  7.2  --   ALBUMIN 3.6  3.6  --  3.1*  --   --   --   --     Recent Labs    11/15/17 0357  12/24/17 0700 01/16/18 0842 01/23/18  WBC 9.6   < > 6.5 10.3 7.0  HGB 9.7*   < > 10.7* 11.0* 10.9*  HCT 29.9*   < > 31.9* 32.7* 32*  MCV 88.7  --  86.4 88.4  --   PLT 270   < > 389 278 369   < > = values in this interval not displayed.   Lab Results  Component Value Date   TSH 2.00 11/20/2017   Lab Results  Component Value Date   HGBA1C 5.3 04/16/2017   Lab Results  Component Value Date   CHOL 188 04/12/2016   HDL 62 04/12/2016   LDLCALC 102 04/12/2016   TRIG 122 04/12/2016   CHOLHDL 3.0 Ratio 06/07/2008    Significant Diagnostic Results in last 30 days:  Dg Chest 2 View  Result Date: 01/16/2018 CLINICAL DATA:  Weakness and shortness of breath EXAM: CHEST  2 VIEW COMPARISON:  06/14/2015 FINDINGS: Cardiac shadow is within normal limits. The lungs are well aerated bilaterally. No focal infiltrate or sizable effusion is seen. Chronic blunting of left costophrenic angle is noted. No new focal abnormality is seen. No acute bony abnormality is noted. IMPRESSION: Chronic changes in the left base.  No acute abnormality noted. Electronically Signed   By: Inez Catalina M.D.   On: 01/16/2018 10:23   Dg Lumbar Spine Complete  Result Date: 01/16/2018 CLINICAL DATA:  Pt from SNF with fall x 10 days ago complains of bilat hip, low back pain worsening since fall EXAM: LUMBAR SPINE - COMPLETE 4+ VIEW COMPARISON:  PET-CT 01/30/2010 FINDINGS: Six lumbar type vertebral bodies, numbering based on previous CT exam from 11/17/2009. There is moderate degenerative change in the mid and lower lumbar levels. No acute fracture. There is 7 millimeters anterolisthesis of L4 on L5. there is 7 millimeters anterolisthesis of L3 on L4. There is 5 millimeters anterolisthesis of L2 on L3. IMPRESSION: 1. No acute fracture. 2. Grade 1 anterolisthesis at L2-3, L3-4, and L4-5. Electronically Signed   By: Nolon Nations M.D.   On: 01/16/2018 08:46   Dg Hips Bilat W Or Wo Pelvis 5  Views  Result Date: 01/16/2018 CLINICAL DATA:  Pt from SNF with fall x 10 days ago complains of bilat hip, low back pain worsening since fall EXAM: DG HIP (WITH OR WITHOUT PELVIS) 5+V BILAT COMPARISON:  PET-CT 01/30/2010, abdominal CT 02/17/2008 FINDINGS: The patient has had bilateral hip arthroplasty. There is deformity left superior and inferior pubic rami, consistent with fractures of indeterminate age. The appearance is new since the previous CT exam of 2011 favors a chronic process over an acute or subacute fracture. There is significant degenerative change in the lower lumbar spine and both SI joints. There is atherosclerotic calcification of the femoral arteries. No evidence for loosening of the hardware. IMPRESSION: 1. Deformity of the left superior and inferior pubic rami, favoring chronic process. However, the abnormality is new since most recent comparison of 2011. 2. No evidence for dislocation of either hip arthroplasty. Electronically Signed   By: Nolon Nations M.D.   On: 01/16/2018 08:42    Assessment/Plan  Lethargy Likely side effect of increased dosing of norco  because patient self backing down on dose of medication has helped with symptom to subside. Decrease norco to 2.5-325 mg tid for now, pt would like it scheduled instead of prn with fear for recurring of pain and delay of treatment.   Left back pain Mid and low back, improved. Change to norco dosing to 2.5-325 mg tid. Continue lidocaine patch and cymbalta current regimen  Constipation Start miralax 17 g every day to help with constipation. Patient would like it scheduled for now. Hold for loose stool. Hydration encouraged.     Family/ staff Communication: reviewed care plan with patient, her daughter and charge nurse. Nurse present during her visit.   Labs/tests ordered:  None  Blanchie Serve, MD Internal Medicine [facility] [address] [place],  [address] Cell Phone  (Monday-Friday 8 am - 5 pm): [phone] On Call: [phone] and follow prompts after 5 pm and on weekends Office Phone: [phone] Office Fax: [phone]

## 2018-02-14 ENCOUNTER — Non-Acute Institutional Stay (SKILLED_NURSING_FACILITY): Payer: Medicare HMO | Admitting: Nurse Practitioner

## 2018-02-14 ENCOUNTER — Encounter: Payer: Self-pay | Admitting: Nurse Practitioner

## 2018-02-14 DIAGNOSIS — R509 Fever, unspecified: Secondary | ICD-10-CM | POA: Diagnosis not present

## 2018-02-14 DIAGNOSIS — R8290 Unspecified abnormal findings in urine: Secondary | ICD-10-CM

## 2018-02-14 DIAGNOSIS — M79675 Pain in left toe(s): Secondary | ICD-10-CM

## 2018-02-14 NOTE — Assessment & Plan Note (Signed)
No pain/sore in left toes today upon my visit which reported 02/11/18 per staff

## 2018-02-14 NOTE — Assessment & Plan Note (Signed)
fever and foul urine 02/13/18, T 98 today. 02/13/18 11:52am reported pain per SW, 2:17pm reported no pain per nurse. On Norco 5/368m 1/2 tid. She denied nausea, vomiting, dysuria, urinary urgency, or increased incontinent of urine, no noted  suprapubic or CVA tenderness/discomfort. Continue to observe.

## 2018-02-14 NOTE — Progress Notes (Signed)
Location:  [facility] Room Number: 17 Place of Service:  SNF (31) Provider:  Koltan Portocarrero, [place]  NP   Blanchie Serve, MD  Patient Care Team: Blanchie Serve, MD as PCP - General (Internal Medicine) Lanelle Bal., MD as Referring Physician (Internal Medicine) Satira Sark, MD as Consulting Physician (Cardiology) Kyung Rudd, MD as Consulting Physician (Radiation Oncology)  Extended Emergency Contact Information Primary Emergency Contact: Neita Garnet of [place] Phone: [phone] Relation: Son Secondary Emergency Contact: Odell,Lance Address: [address]          [place], [place] [address] [name] of [place] Phone: [phone] Mobile Phone: [phone] Relation: Son  Code Status:  DNR Goals of care: Advanced Directive information Advanced Directives 01/30/2018  Does Patient Have a Medical Advance Directive? Yes  Type of Advance Directive Living will;Out of facility DNR (pink MOST or yellow form)  Does patient want to make changes to medical advance directive? No - Patient declined  Copy of [place] in Chart? -  Would patient like information on creating a medical advance directive? -  Pre-existing out of facility DNR order (yellow form or pink MOST form) Pink MOST form placed in chart (order not valid for inpatient use)     Chief Complaint  Patient presents with  . Acute Visit    (L) great toe has sore, pain mangement    HPI:  Pt is a 84 y.o. female seen today for an acute visit for reported fever and foul urine 02/13/18, T 98 today. 02/13/18 11:52am reported pain per SW, 2:17pm reported no pain per nurse. On Norco 5/380m 1/2 tid. She denied nausea, vomiting, dysuria, urinary urgency, or increased incontinent of urine, no noted  suprapubic or CVA tenderness/discomfort. No pain/sore in left toes today upon my visit which reported 02/11/18. She appears no lethargy upon my visit today.    Past  Medical History:  Diagnosis Date  . Allergic rhinitis   . Anemia    NOS  . Anxiety   . Biceps tendon rupture    Bilateral  . Breast cancer ([facility] 2001   Left s/p lumpectomy and XRT   . Bronchiectasis ([facility] 2014   Noted on chest x-ray  . Complication of anesthesia    slow to wake up  . Cough 2014  . Depression   . Diverticulosis   . Elevated liver enzymes    Biopsy consistent with steatohepatitis  . Fundic gland polyps of stomach, benign   . GERD (gastroesophageal reflux disease) 06/2007   EGD Dr RGala Romney>[facility], multiple fundic gland polyps  . Glaucoma 2013   both eyes  . Hemorrhoids   . Hiatal hernia   . Hx of radiation therapy 07/17/10 to 07/21/10   SRS LLL lung  . Hyperlipidemia   . Insomnia   . Low back pain    scoliosis  . Lung cancer ([facility] 05/2010   Left 2011 - rad x 3  . Lymphedema    Left arm  . Osteoarthritis   . Osteopenia 10/25/2016  . Osteoporosis, senile   . Overactive bladder   . Pneumonia    RLL with sepsis  . PSVT (paroxysmal supraventricular tachycardia) ([facility]   . Rheumatic fever    Age 84 . Right thyroid nodule   . Steatohepatitis    liver bx  . Type 2 diabetes mellitus ([facility]    Past Surgical History:  Procedure Laterality Date  . ABDOMINAL HYSTERECTOMY  1964  . APPENDECTOMY  [place] THYROID  11/2008  . BREAST BIOPSY     X 3 w/ cystectomy  . BREAST LUMPECTOMY  2001  . CATARACT EXTRACTION Bilateral 2003   Lens implant Dr. Charise Killian  . CHOLECYSTECTOMY  1965  . COLONOSCOPY  09/11/2011   Procedure: COLONOSCOPY;  Surgeon: Dorothyann Peng, MD;  [facility];  Service: Endoscopy;  Laterality: N/A;  . COLONOSCOPY  2005 /2012   Dr. Lucianne Muss- diverticulosis, ext hemorrhoids.  . COLONOSCOPY WITH PROPOFOL N/A 11/09/2017   Procedure: COLONOSCOPY WITH PROPOFOL;  Surgeon: Mauri Pole, MD;  [facility];  Service: Endoscopy;  Laterality: N/A;  . COLONOSCOPY WITH PROPOFOL N/A 11/12/2017   Procedure: COLONOSCOPY WITH PROPOFOL;   Surgeon: Ladene Artist, MD;  [facility];  Service: Endoscopy;  Laterality: N/A;  . CYSTOSCOPY  2007  . ESOPHAGOGASTRODUODENOSCOPY  09/11/2011   Procedure: ESOPHAGOGASTRODUODENOSCOPY (EGD);  Surgeon: Dorothyann Peng, MD;  [facility];  Service: Endoscopy;  Laterality: N/A;  . ESOPHAGOGASTRODUODENOSCOPY (EGD) WITH PROPOFOL N/A 11/08/2017   Procedure: ESOPHAGOGASTRODUODENOSCOPY (EGD) WITH PROPOFOL;  Surgeon: Mauri Pole, MD;  [facility];  Service: Endoscopy;  Laterality: N/A;  . LIVER BIOPSY  2008  . LUNG BIOPSY Left 06/06/2010  . REVISION TOTAL HIP ARTHROPLASTY  2007   Left  . SKIN CANCER EXCISION     nose and left lower cheek  . TONSILLECTOMY  1930  . TOTAL HIP ARTHROPLASTY Bilateral 2005 & 2007    Dr. Earl Lagos. Aline Brochure     Allergies  Allergen Reactions  . Augmentin [Amoxicillin-Pot Clavulanate] Other (See Comments)    unknown  . Ibuprofen Hives  . Naproxen Other (See Comments)    unknown  . Statins Other (See Comments)    Feels like knives in stomach  . Surgilube [Gyne-Moistrin]     Rectal itching, burning  . Tape Rash    Outpatient Encounter Medications as of 02/14/2018  Medication Sig  . acetaminophen (TYLENOL) 325 MG tablet Take 650 mg by mouth every 4 (four) hours as needed.   Marland Kitchen atenolol (TENORMIN) 25 MG tablet Take 25 mg by mouth daily.  . cetirizine (ZYRTEC) 10 MG tablet Take 10 mg by mouth daily.  . dorzolamide-timolol (COSOPT) 22.3-6.8 MG/ML ophthalmic solution Place 1 drop into both eyes 2 (two) times daily.   . DULoxetine (CYMBALTA) 20 MG capsule TAKE (1) CAPSULE DAILY.  Marland Kitchen Ferrous Sulfate 27 MG TABS Take 1 tablet by mouth daily.  . folic acid (FOLVITE) 790 MCG tablet Take 400 mcg by mouth daily.  Marland Kitchen gabapentin (NEURONTIN) 100 MG capsule Take 1 capsule (100 mg total) by mouth 3 (three) times daily.  Marland Kitchen HYDROcodone-acetaminophen (NORCO/VICODIN) 5-325 MG tablet Take 1 tablet by mouth 3 (three) times daily.   . hydrocortisone  (ANUSOL-HC) 2.5 % rectal cream Place 1 application rectally 2 (two) times daily.  Marland Kitchen lactose free nutrition (BOOST) LIQD Take 237 mLs by mouth 2 (two) times daily between meals.  . latanoprost (XALATAN) 0.005 % ophthalmic solution Place 1 drop into both eyes at bedtime.  . lidocaine (LIDODERM) 5 % Place 1 patch onto the skin daily. Remove & Discard patch within 12 hours or as directed by MD  . Multiple Vitamins-Minerals (PRESERVISION AREDS 2 PO) Take 1 capsule by mouth 2 (two) times daily.   Marland Kitchen omeprazole (PRILOSEC) 20 MG capsule TAKE 1 CAPSULE EVERY DAY.  Marland Kitchen polyethylene glycol (MIRALAX / GLYCOLAX) packet Take 17 g by mouth daily.  . vitamin B-12 (CYANOCOBALAMIN) 1000 MCG  tablet Take 1,000 mcg by mouth daily.   No facility-administered encounter medications on file as of 02/14/2018.    ROS was provided with assistance of staff Review of Systems  Constitutional: Negative for activity change, appetite change, chills, diaphoresis, fatigue and fever.  HENT: Negative for congestion.   Respiratory: Negative for cough, chest tightness, shortness of breath and wheezing.   Cardiovascular: Positive for leg swelling. Negative for chest pain and palpitations.  Gastrointestinal: Negative for abdominal distention, abdominal pain, blood in stool, constipation, diarrhea, nausea and vomiting.  Genitourinary: Negative for decreased urine volume, difficulty urinating, dysuria, hematuria, pelvic pain and urgency.  Musculoskeletal: Positive for back pain and gait problem.  Skin: Negative for color change and pallor.  Neurological: Negative for dizziness, tremors and weakness.  Psychiatric/Behavioral: Negative for agitation, behavioral problems, hallucinations and sleep disturbance. The patient is not nervous/anxious.     Immunization History  Administered Date(s) Administered  . Influenza Whole 09/22/2008, 09/16/2012, 09/30/2013  . Influenza, High Dose Seasonal PF 09/25/2017  . Influenza-Unspecified 10/02/2014,  09/15/2015, 09/27/2016  . Pneumococcal Polysaccharide-23 08/17/2006  . Td 12/17/2005   Pertinent  Health Maintenance Due  Topic Date Due  . FOOT EXAM  07/19/1931  . OPHTHALMOLOGY EXAM  07/19/1931  . PNA vac Low Risk Adult (2 of 2 - PCV13) 08/18/2007  . URINE MICROALBUMIN  04/30/2008  . HEMOGLOBIN A1C  10/17/2017  . INFLUENZA VACCINE  Completed  . DEXA SCAN  Completed   Fall Risk  09/19/2017 05/17/2016 04/20/2016 04/19/2016  Falls in the past year? No No No No   Functional Status Survey:    Vitals:   02/14/18 1018  BP: 140/76  Pulse: 82  Resp: 18  Temp: (!) 97.5 F (36.4 C)  SpO2: 94%  Weight: 152 lb 3.2 oz (69 kg)  Height: 5' 5"  (1.651 m)   Body mass index is 25.33 kg/m. Physical Exam  Constitutional: She is oriented to person, place, and time. She appears well-developed and well-nourished.  HENT:  Head: Normocephalic and atraumatic.  Eyes: Conjunctivae and EOM are normal. Pupils are equal, round, and reactive to light.  Neck: Normal range of motion. No JVD present. No thyromegaly present.  Cardiovascular: Normal rate, regular rhythm, normal heart sounds and intact distal pulses.  No murmur heard. Pulmonary/Chest: Effort normal and breath sounds normal. She has no wheezes. She has no rales.  Abdominal: Soft. Bowel sounds are normal. She exhibits no distension. There is no tenderness. There is no rebound and no guarding.  Musculoskeletal: She exhibits edema and tenderness.  Left lower back/hip region pain, chronic, positional. Trace edema BLE  Neurological: She is alert and oriented to person, place, and time. She exhibits normal muscle tone. Coordination normal.  Skin: Skin is warm and dry. No rash noted. No erythema.  Psychiatric: She has a normal mood and affect. Her behavior is normal.    Labs reviewed: Recent Labs    11/05/17 0346  12/24/17 0700 01/16/18 0842 01/23/18  NA 135   < > 135 136 138  K 3.9   < > 3.9 3.5 3.8  CL 109  --  97* 101  --   CO2 22  --  31  29  --   GLUCOSE 119*  --  79 99  --   BUN 34*   < > 15 18 19   CREATININE 0.84   < > 0.78 0.70 0.7  CALCIUM 8.2*  --  10.0 9.8  --    < > = values in this interval  not displayed.   Recent Labs    07/24/17 0931 08/21/17 0000 11/03/17 1818 11/19/17 12/05/17 12/24/17 0700 01/23/18  AST 17  17 16 20 18 23 17  17 23   ALT 11  11 14  13* 12 20 12  12 16   ALKPHOS 90  90  --  78 74 97  --  217*  BILITOT 0.4  0.4 0.3 0.4  --   --  0.4  0.4  --   PROT 7.0  7.0 7.0 6.5  --   --  7.2  7.2  --   ALBUMIN 3.6  3.6  --  3.1*  --   --   --   --    Recent Labs    11/15/17 0357  12/24/17 0700 01/16/18 0842 01/23/18  WBC 9.6   < > 6.5 10.3 7.0  HGB 9.7*   < > 10.7* 11.0* 10.9*  HCT 29.9*   < > 31.9* 32.7* 32*  MCV 88.7  --  86.4 88.4  --   PLT 270   < > 389 278 369   < > = values in this interval not displayed.   Lab Results  Component Value Date   TSH 2.00 11/20/2017   Lab Results  Component Value Date   HGBA1C 5.3 04/16/2017   Lab Results  Component Value Date   CHOL 188 04/12/2016   HDL 62 04/12/2016   LDLCALC 102 04/12/2016   TRIG 122 04/12/2016   CHOLHDL 3.0 Ratio 06/07/2008    Significant Diagnostic Results in last 30 days:  No results found.  Assessment/Plan Fever fever and foul urine 02/13/18, T 98 today. 02/13/18 11:52am reported pain per SW, 2:17pm reported no pain per nurse. On Norco 5/383m 1/2 tid. She denied nausea, vomiting, dysuria, urinary urgency, or increased incontinent of urine, no noted  suprapubic or CVA tenderness/discomfort. Continue to observe.   Toe pain, left  No pain/sore in left toes today upon my visit which reported 02/11/18 per staff   Foul smelling urine Encourage oral fluid intake, monitor urine and s/s of UTI.      Family/ staff Communication: plan of care reviewed with the patient patient and charge nurse.   Labs/tests ordered:  None  Time spend 25 minutes.

## 2018-02-14 NOTE — Assessment & Plan Note (Signed)
Encourage oral fluid intake, monitor urine and s/s of UTI.

## 2018-02-20 ENCOUNTER — Encounter: Payer: Self-pay | Admitting: Nurse Practitioner

## 2018-02-26 ENCOUNTER — Non-Acute Institutional Stay (SKILLED_NURSING_FACILITY): Payer: Medicare HMO | Admitting: Nurse Practitioner

## 2018-02-26 ENCOUNTER — Encounter: Payer: Self-pay | Admitting: Nurse Practitioner

## 2018-02-26 DIAGNOSIS — F418 Other specified anxiety disorders: Secondary | ICD-10-CM | POA: Diagnosis not present

## 2018-02-26 DIAGNOSIS — K5900 Constipation, unspecified: Secondary | ICD-10-CM

## 2018-02-26 DIAGNOSIS — N318 Other neuromuscular dysfunction of bladder: Secondary | ICD-10-CM

## 2018-02-26 DIAGNOSIS — I10 Essential (primary) hypertension: Secondary | ICD-10-CM | POA: Diagnosis not present

## 2018-02-26 DIAGNOSIS — M150 Primary generalized (osteo)arthritis: Secondary | ICD-10-CM

## 2018-02-26 DIAGNOSIS — R69 Illness, unspecified: Secondary | ICD-10-CM | POA: Diagnosis not present

## 2018-02-26 DIAGNOSIS — M8949 Other hypertrophic osteoarthropathy, multiple sites: Secondary | ICD-10-CM

## 2018-02-26 DIAGNOSIS — M159 Polyosteoarthritis, unspecified: Secondary | ICD-10-CM

## 2018-02-26 DIAGNOSIS — E611 Iron deficiency: Secondary | ICD-10-CM

## 2018-02-26 NOTE — Progress Notes (Signed)
Location:  [facility] Room Number: 17 Place of Service:  SNF (31) Provider:  Landen Breeland, [place]  NP  Blanchie Serve, MD  Patient Care Team: Blanchie Serve, MD as PCP - General (Internal Medicine) Lanelle Bal., MD as Referring Physician (Internal Medicine) Satira Sark, MD as Consulting Physician (Cardiology) Kyung Rudd, MD as Consulting Physician (Radiation Oncology)  Extended Emergency Contact Information Primary Emergency Contact: Neita Garnet of [place] Phone: [phone] Relation: Son Secondary Emergency Contact: Sneeringer,Lance Address: [address]          [place], [place] [address] [name] of [place] Phone: [phone] Mobile Phone: [phone] Relation: Son  Code Status:  DNR Goals of care: Advanced Directive information Advanced Directives 02/26/2018  Does Patient Have a Medical Advance Directive? Yes  Type of Advance Directive Living will;Out of facility DNR (pink MOST or yellow form)  Does patient want to make changes to medical advance directive? No - Patient declined  Copy of [place] in Chart? -  Would patient like information on creating a medical advance directive? -  Pre-existing out of facility DNR order (yellow form or pink MOST form) Yellow form placed in chart (order not valid for inpatient use)     Chief Complaint  Patient presents with  . Medical Management of Chronic Issues    F/U- fever, (L) toe pain, foul smelling urine    HPI:  Pt is a 84 y.o. female seen today for medical management of chronic diseases.     The patient has history of osteoarthritis in multiple joints, mainly in her lower back and left hip, her pain is better managed with Norco 5/325 1/2 tid, Gabapentin 136m tid, but she doesn't like the side effects of Norco of  constipation which is managed on MiraLax daily.  Her mood is stable on Cymbalta 225mdaily. Her heart rate and blood pressure is managed  on Atenolol 2558md. Urinary frequency is managed with bedside commode since her mobility is limited, but she said she is thinking about using pads instead of getting up at night. She takes Fe, Folic acid, Vit B12X45ily, Hgb 10.9 01/23/18   Past Medical History:  Diagnosis Date  . Allergic rhinitis   . Anemia    NOS  . Anxiety   . Biceps tendon rupture    Bilateral  . [facility] ([facility]   Left s/p lumpectomy and XRT   . Bronchiectasis ([facility]   Noted on chest x-ray  . Complication of anesthesia    slow to wake up  . Cough 2014  . Depression   . Diverticulosis   . Elevated liver enzymes    Biopsy consistent with steatohepatitis  . Fundic gland polyps of stomach, benign   . GERD (gastroesophageal reflux disease) 06/2007   EGD Dr RouGala Romneym [facility], multiple fundic gland polyps  . Glaucoma 2013   both eyes  . Hemorrhoids   . Hiatal hernia   . Hx of radiation therapy 07/17/10 to 07/21/10   SRS LLL lung  . Hyperlipidemia   . Insomnia   . Low back pain    scoliosis  . Lung cancer ([facility]/2011   Left 2011 - rad x 3  . Lymphedema    Left arm  . Osteoarthritis   . Osteopenia 10/25/2016  . Osteoporosis, senile   . Overactive bladder   . Pneumonia    RLL with sepsis  . PSVT (paroxysmal supraventricular tachycardia) ([facility] .  Rheumatic fever    Age 43  . Right thyroid nodule   . Steatohepatitis    liver bx  . Type 2 diabetes mellitus ([place])    Past Surgical History:  Procedure Laterality Date  . ABDOMINAL HYSTERECTOMY  1964  . APPENDECTOMY  1964  . BIOPSY THYROID  11/2008  . BREAST BIOPSY     X 3 w/ cystectomy  . BREAST LUMPECTOMY  2001  . CATARACT EXTRACTION Bilateral 2003   Lens implant Dr. Charise Killian  . CHOLECYSTECTOMY  1965  . COLONOSCOPY  09/11/2011   Procedure: COLONOSCOPY;  Surgeon: Dorothyann Peng, MD;  [facility];  Service: Endoscopy;  Laterality: N/A;  . COLONOSCOPY  2005 /2012   Dr. Lucianne Muss- diverticulosis, ext hemorrhoids.  . COLONOSCOPY WITH PROPOFOL  N/A 11/09/2017   Procedure: COLONOSCOPY WITH PROPOFOL;  Surgeon: Mauri Pole, MD;  [facility];  Service: Endoscopy;  Laterality: N/A;  . COLONOSCOPY WITH PROPOFOL N/A 11/12/2017   Procedure: COLONOSCOPY WITH PROPOFOL;  Surgeon: Ladene Artist, MD;  [facility];  Service: Endoscopy;  Laterality: N/A;  . CYSTOSCOPY  2007  . ESOPHAGOGASTRODUODENOSCOPY  09/11/2011   Procedure: ESOPHAGOGASTRODUODENOSCOPY (EGD);  Surgeon: Dorothyann Peng, MD;  [facility];  Service: Endoscopy;  Laterality: N/A;  . ESOPHAGOGASTRODUODENOSCOPY (EGD) WITH PROPOFOL N/A 11/08/2017   Procedure: ESOPHAGOGASTRODUODENOSCOPY (EGD) WITH PROPOFOL;  Surgeon: Mauri Pole, MD;  [facility];  Service: Endoscopy;  Laterality: N/A;  . LIVER BIOPSY  2008  . LUNG BIOPSY Left 06/06/2010  . REVISION TOTAL HIP ARTHROPLASTY  2007   Left  . SKIN CANCER EXCISION     nose and left lower cheek  . TONSILLECTOMY  1930  . TOTAL HIP ARTHROPLASTY Bilateral 2005 & 2007    Dr. Earl Lagos. Aline Brochure     Allergies  Allergen Reactions  . Augmentin [Amoxicillin-Pot Clavulanate] Other (See Comments)    unknown  . Ibuprofen Hives  . Naproxen Other (See Comments)    unknown  . Statins Other (See Comments)    Feels like knives in stomach  . Surgilube [Gyne-Moistrin]     Rectal itching, burning  . Tape Rash    Outpatient Encounter Medications as of 02/26/2018  Medication Sig  . atenolol (TENORMIN) 25 MG tablet Take 25 mg by mouth daily.  . cetirizine (ZYRTEC) 10 MG tablet Take 10 mg by mouth daily.  . dorzolamide-timolol (COSOPT) 22.3-6.8 MG/ML ophthalmic solution Place 1 drop into both eyes 2 (two) times daily.   . DULoxetine (CYMBALTA) 20 MG capsule TAKE (1) CAPSULE DAILY.  Marland Kitchen Ferrous Sulfate 27 MG TABS Take 1 tablet by mouth daily.  . folic acid (FOLVITE) 370 MCG tablet Take 400 mcg by mouth daily.  Marland Kitchen gabapentin (NEURONTIN) 100 MG capsule Take 1 capsule (100 mg total) by mouth 3  (three) times daily.  Marland Kitchen HYDROcodone-acetaminophen (NORCO/VICODIN) 5-325 MG tablet Take 1 tablet by mouth 3 (three) times daily.   . hydrocortisone (ANUSOL-HC) 2.5 % rectal cream Place 1 application rectally 2 (two) times daily.  Marland Kitchen lactose free nutrition (BOOST) LIQD Take 237 mLs by mouth 2 (two) times daily between meals.  . latanoprost (XALATAN) 0.005 % ophthalmic solution Place 1 drop into both eyes at bedtime.  . lidocaine (LIDODERM) 5 % Place 1 patch onto the skin daily. Remove & Discard patch within 12 hours or as directed by MD  . Multiple Vitamins-Minerals (PRESERVISION AREDS 2 PO) Take 1 capsule by mouth 2 (two) times daily.   Marland Kitchen omeprazole (PRILOSEC)  20 MG capsule TAKE 1 CAPSULE EVERY DAY.  Marland Kitchen polyethylene glycol (MIRALAX / GLYCOLAX) packet Take 17 g by mouth daily.  . vitamin B-12 (CYANOCOBALAMIN) 1000 MCG tablet Take 1,000 mcg by mouth daily.  . [DISCONTINUED] acetaminophen (TYLENOL) 325 MG tablet Take 650 mg by mouth every 4 (four) hours as needed.    No facility-administered encounter medications on file as of 02/26/2018.     Review of Systems  Constitutional: Negative for activity change, appetite change, chills, diaphoresis, fatigue and fever.  HENT: Positive for hearing loss. Negative for congestion, trouble swallowing and voice change.   Respiratory: Negative for cough, choking, chest tightness, shortness of breath and wheezing.   Cardiovascular: Positive for leg swelling. Negative for chest pain and palpitations.  Gastrointestinal: Negative for abdominal distention, abdominal pain, constipation, diarrhea and vomiting.  Genitourinary: Positive for frequency. Negative for difficulty urinating, dysuria and urgency.  Musculoskeletal: Positive for back pain and gait problem.  Neurological: Negative for dizziness, speech difficulty, weakness and headaches.  Psychiatric/Behavioral: Negative for agitation, behavioral problems, confusion and hallucinations. The patient is not  nervous/anxious.     Immunization History  Administered Date(s) Administered  . Influenza Whole 09/22/2008, 09/16/2012, 09/30/2013  . Influenza, High Dose Seasonal PF 09/25/2017  . Influenza-Unspecified 10/02/2014, 09/15/2015, 09/27/2016  . Pneumococcal Polysaccharide-23 08/17/2006  . Td 12/17/2005   Pertinent  Health Maintenance Due  Topic Date Due  . FOOT EXAM  07/19/1931  . OPHTHALMOLOGY EXAM  07/19/1931  . PNA vac Low Risk Adult (2 of 2 - PCV13) 08/18/2007  . URINE MICROALBUMIN  04/30/2008  . HEMOGLOBIN A1C  10/17/2017  . INFLUENZA VACCINE  Completed  . DEXA SCAN  Completed   Fall Risk  09/19/2017 05/17/2016 04/20/2016 04/19/2016  Falls in the past year? No No No No   Functional Status Survey:    Vitals:   02/26/18 1305  BP: 134/70  Pulse: 68  Resp: 20  Temp: 97.6 F (36.4 C)  SpO2: 96%  Weight: 148 lb 8 oz (67.4 kg)  Height: 5' 5"  (1.651 m)   Body mass index is 24.71 kg/m. Physical Exam  Constitutional: She is oriented to person, place, and time. She appears well-developed and well-nourished.  HENT:  Head: Normocephalic and atraumatic.  Eyes: Conjunctivae and EOM are normal. Pupils are equal, round, and reactive to light.  Neck: Normal range of motion. Neck supple. No JVD present.  Cardiovascular: Normal rate and regular rhythm.  No murmur heard. Pulmonary/Chest: Effort normal and breath sounds normal. She has no wheezes. She has no rales.  Abdominal: Soft. Bowel sounds are normal. She exhibits no distension. There is no tenderness.  Musculoskeletal: She exhibits edema and tenderness.  Self transfer, w/c for mobility, trace edema BLE  Neurological: She is alert and oriented to person, place, and time. She exhibits normal muscle tone. Coordination normal.  Skin: Skin is warm and dry.  Psychiatric: She has a normal mood and affect. Her behavior is normal. Judgment and thought content normal.    Labs reviewed: Recent Labs    11/05/17 0346  12/24/17 0700  01/16/18 0842 01/23/18  NA 135   < > 135 136 138  K 3.9   < > 3.9 3.5 3.8  CL 109  --  97* 101  --   CO2 22  --  31 29  --   GLUCOSE 119*  --  79 99  --   BUN 34*   < > 15 18 19   CREATININE 0.84   < > 0.78  0.70 0.7  CALCIUM 8.2*  --  10.0 9.8  --    < > = values in this interval not displayed.   Recent Labs    07/24/17 0931 08/21/17 0000 11/03/17 1818 11/19/17 12/05/17 12/24/17 0700 01/23/18  AST 17  17 16 20 18 23 17  17 23   ALT 11  11 14  13* 12 20 12  12 16   ALKPHOS 90  90  --  78 74 97  --  217*  BILITOT 0.4  0.4 0.3 0.4  --   --  0.4  0.4  --   PROT 7.0  7.0 7.0 6.5  --   --  7.2  7.2  --   ALBUMIN 3.6  3.6  --  3.1*  --   --   --   --    Recent Labs    11/15/17 0357  12/24/17 0700 01/16/18 0842 01/23/18  WBC 9.6   < > 6.5 10.3 7.0  HGB 9.7*   < > 10.7* 11.0* 10.9*  HCT 29.9*   < > 31.9* 32.7* 32*  MCV 88.7  --  86.4 88.4  --   PLT 270   < > 389 278 369   < > = values in this interval not displayed.   Lab Results  Component Value Date   TSH 2.00 11/20/2017   Lab Results  Component Value Date   HGBA1C 5.3 04/16/2017   Lab Results  Component Value Date   CHOL 188 04/12/2016   HDL 62 04/12/2016   LDLCALC 102 04/12/2016   TRIG 122 04/12/2016   CHOLHDL 3.0 Ratio 06/07/2008    Significant Diagnostic Results in last 30 days:  No results found.  Assessment/Plan Osteoarthritis osteoarthritis in multiple joints, mainly in her lower back and left hip, her pain is better managed with Norco 5/325 1/2 tid, Gabapentin 129m tid, but she doesn't like the side effects of Norco of  constipation which is managed on MiraLax daily.    Constipation Stable, continue MiraLax daily.   Depression with anxiety Her mood is stable, continue Cymbalta 28mdaily.    HTN (hypertension) Her heart rate and blood pressure is managed, continue Atenolol 2576md.   OVERACTIVE BLADDER Urinary frequency is managed with bedside commode since her mobility is limited, but  she said she is thinking about using pads instead of getting up at night.   Iron deficiency She takes Fe, Folic acid, Vit [id number], Hgb 10.9 01/23/18     Family/ staff Communication: plan of care reviewed with the patient and charge nurse.   Labs/tests ordered: none  Time spend 25 minutes.

## 2018-02-27 NOTE — Assessment & Plan Note (Signed)
Her mood is stable, continue Cymbalta 45m daily.

## 2018-02-27 NOTE — Assessment & Plan Note (Signed)
Her heart rate and blood pressure is managed, continue Atenolol 41m qd.

## 2018-02-27 NOTE — Assessment & Plan Note (Signed)
Urinary frequency is managed with bedside commode since her mobility is limited, but she said she is thinking about using pads instead of getting up at night.

## 2018-02-27 NOTE — Assessment & Plan Note (Signed)
osteoarthritis in multiple joints, mainly in her lower back and left hip, her pain is better managed with Norco 5/325 1/2 tid, Gabapentin 159m tid, but she doesn't like the side effects of Norco of  constipation which is managed on MiraLax daily.

## 2018-02-27 NOTE — Assessment & Plan Note (Signed)
Stable, continue MiraLax daily.

## 2018-02-27 NOTE — Assessment & Plan Note (Signed)
She takes Fe, Folic acid, Vit Z58 daily, Hgb 10.9 01/23/18

## 2018-03-06 ENCOUNTER — Non-Acute Institutional Stay (SKILLED_NURSING_FACILITY): Payer: Medicare HMO | Admitting: Nurse Practitioner

## 2018-03-06 ENCOUNTER — Encounter: Payer: Self-pay | Admitting: Nurse Practitioner

## 2018-03-06 DIAGNOSIS — M545 Low back pain, unspecified: Secondary | ICD-10-CM

## 2018-03-06 DIAGNOSIS — F418 Other specified anxiety disorders: Secondary | ICD-10-CM

## 2018-03-06 DIAGNOSIS — G8929 Other chronic pain: Secondary | ICD-10-CM | POA: Diagnosis not present

## 2018-03-06 DIAGNOSIS — R69 Illness, unspecified: Secondary | ICD-10-CM | POA: Diagnosis not present

## 2018-03-06 NOTE — Progress Notes (Signed)
Location:  [facility] Room Number: 17 Place of Service:  SNF (31) Provider:  Jaryan Chicoine, [place]  NP  Blanchie Serve, MD  Patient Care Team: Blanchie Serve, MD as PCP - General (Internal Medicine) Lanelle Bal., MD as Referring Physician (Internal Medicine) Satira Sark, MD as Consulting Physician (Cardiology) Kyung Rudd, MD as Consulting Physician (Radiation Oncology)  Extended Emergency Contact Information Primary Emergency Contact: Neita Garnet of [place] Phone: [phone] Relation: Son Secondary Emergency Contact: Sessums,Lance Address: [address]          [place], [place] [address] [name] of [place] Phone: [phone] Mobile Phone: [phone] Relation: Son  Code Status:  DNR Goals of care: Advanced Directive information Advanced Directives 02/26/2018  Does Patient Have a Medical Advance Directive? Yes  Type of Advance Directive Living will;Out of facility DNR (pink MOST or yellow form)  Does patient want to make changes to medical advance directive? No - Patient declined  Copy of [place] in Chart? -  Would patient like information on creating a medical advance directive? -  Pre-existing out of facility DNR order (yellow form or pink MOST form) Yellow form placed in chart (order not valid for inpatient use)     Chief Complaint  Patient presents with  . Acute Visit    Resident refused medication times 2 days    HPI:  Pt is a 84 y.o. female seen today for an acute visit for evaluation of left lower back pain, the patient has been refusing Norco mid day dose and has no change of her pain. She is taking Norco 5/3105m 1/2 tab tid for pain. Her mood is stable on Cymbalta 216mmay also helped with her back pain.    Past Medical History:  Diagnosis Date  . Allergic rhinitis   . Anemia    NOS  . Anxiety   . Biceps tendon rupture    Bilateral  . Breast cancer ([facility]   Left s/p  lumpectomy and XRT   . Bronchiectasis ([facility]   Noted on chest x-ray  . Complication of anesthesia    slow to wake up  . Cough 2014  . Depression   . Diverticulosis   . Elevated liver enzymes    Biopsy consistent with steatohepatitis  . Fundic gland polyps of stomach, benign   . GERD (gastroesophageal reflux disease) 06/2007   EGD Dr RoGala Romneysm [facility], multiple fundic gland polyps  . Glaucoma 2013   both eyes  . Hemorrhoids   . Hiatal hernia   . Hx of radiation therapy 07/17/10 to 07/21/10   SRS LLL lung  . Hyperlipidemia   . Insomnia   . Low back pain    scoliosis  . Lung cancer ([facility]6/2011   Left 2011 - rad x 3  . Lymphedema    Left arm  . Osteoarthritis   . Osteopenia 10/25/2016  . Osteoporosis, senile   . Overactive bladder   . Pneumonia    RLL with sepsis  . PSVT (paroxysmal supraventricular tachycardia) ([facility]  . Rheumatic fever    Age 89. Right thyroid nodule   . Steatohepatitis    liver bx  . Type 2 diabetes mellitus ([facility]   Past Surgical History:  Procedure Laterality Date  . ABDOMINAL HYSTERECTOMY  1964  . APPENDECTOMY  1964  . BIOPSY THYROID  11/2008  . BREAST BIOPSY     X 3 w/ cystectomy  .  BREAST LUMPECTOMY  2001  . CATARACT EXTRACTION Bilateral 2003   Lens implant Dr. Charise Killian  . CHOLECYSTECTOMY  1965  . COLONOSCOPY  09/11/2011   Procedure: COLONOSCOPY;  Surgeon: Dorothyann Peng, MD;  [facility];  Service: Endoscopy;  Laterality: N/A;  . COLONOSCOPY  2005 /2012   Dr. Lucianne Muss- diverticulosis, ext hemorrhoids.  . COLONOSCOPY WITH PROPOFOL N/A 11/09/2017   Procedure: COLONOSCOPY WITH PROPOFOL;  Surgeon: Mauri Pole, MD;  [facility];  Service: Endoscopy;  Laterality: N/A;  . COLONOSCOPY WITH PROPOFOL N/A 11/12/2017   Procedure: COLONOSCOPY WITH PROPOFOL;  Surgeon: Ladene Artist, MD;  Location: [facility];  Service: Endoscopy;  Laterality: N/A;  . CYSTOSCOPY  2007  . ESOPHAGOGASTRODUODENOSCOPY  09/11/2011   Procedure:  ESOPHAGOGASTRODUODENOSCOPY (EGD);  Surgeon: Dorothyann Peng, MD;  [facility];  Service: Endoscopy;  Laterality: N/A;  . ESOPHAGOGASTRODUODENOSCOPY (EGD) WITH PROPOFOL N/A 11/08/2017   Procedure: ESOPHAGOGASTRODUODENOSCOPY (EGD) WITH PROPOFOL;  Surgeon: Mauri Pole, MD;  [facility];  Service: Endoscopy;  Laterality: N/A;  . LIVER BIOPSY  2008  . LUNG BIOPSY Left 06/06/2010  . REVISION TOTAL HIP ARTHROPLASTY  2007   Left  . SKIN CANCER EXCISION     nose and left lower cheek  . TONSILLECTOMY  1930  . TOTAL HIP ARTHROPLASTY Bilateral 2005 & 2007    Dr. Earl Lagos. Aline Brochure     Allergies  Allergen Reactions  . Augmentin [Amoxicillin-Pot Clavulanate] Other (See Comments)    unknown  . Ibuprofen Hives  . Naproxen Other (See Comments)    unknown  . Statins Other (See Comments)    Feels like knives in stomach  . Surgilube [Gyne-Moistrin]     Rectal itching, burning  . Tape Rash    Outpatient Encounter Medications as of 03/06/2018  Medication Sig  . atenolol (TENORMIN) 25 MG tablet Take 25 mg by mouth daily.  . cetirizine (ZYRTEC) 10 MG tablet Take 10 mg by mouth daily.  . dorzolamide-timolol (COSOPT) 22.3-6.8 MG/ML ophthalmic solution Place 1 drop into both eyes 2 (two) times daily.   . DULoxetine (CYMBALTA) 20 MG capsule TAKE (1) CAPSULE DAILY.  Marland Kitchen Ferrous Sulfate 27 MG TABS Take 1 tablet by mouth daily.  . folic acid (FOLVITE) 158 MCG tablet Take 400 mcg by mouth daily.  Marland Kitchen gabapentin (NEURONTIN) 100 MG capsule Take 1 capsule (100 mg total) by mouth 3 (three) times daily.  Marland Kitchen HYDROcodone-acetaminophen (NORCO/VICODIN) 5-325 MG tablet Take 1 tablet by mouth 3 (three) times daily.   . hydrocortisone (ANUSOL-HC) 2.5 % rectal cream Place 1 application rectally 2 (two) times daily.  Marland Kitchen lactose free nutrition (BOOST) LIQD Take 237 mLs by mouth 2 (two) times daily between meals.  . latanoprost (XALATAN) 0.005 % ophthalmic solution Place 1 drop into both eyes at  bedtime.  . lidocaine (LIDODERM) 5 % Place 1 patch onto the skin daily. Remove & Discard patch within 12 hours or as directed by MD  . Multiple Vitamins-Minerals (PRESERVISION AREDS 2 PO) Take 1 capsule by mouth 2 (two) times daily.   Marland Kitchen omeprazole (PRILOSEC) 20 MG capsule TAKE 1 CAPSULE EVERY DAY.  . vitamin B-12 (CYANOCOBALAMIN) 1000 MCG tablet Take 1,000 mcg by mouth daily.  . [DISCONTINUED] polyethylene glycol (MIRALAX / GLYCOLAX) packet Take 17 g by mouth daily.   No facility-administered encounter medications on file as of 03/06/2018.     Review of Systems  Constitutional: Negative for activity change, appetite change, chills, diaphoresis, fatigue and fever.  Cardiovascular: Positive for leg swelling.  Gastrointestinal: Negative for abdominal distention, abdominal pain, constipation, diarrhea, nausea and vomiting.  Genitourinary: Negative for difficulty urinating, dysuria and urgency.       Uses bedside commode, may consider using adult depend at night to avoid bathroom trips at night.   Musculoskeletal: Positive for arthralgias, back pain and gait problem.  Skin: Negative for color change.  Neurological: Negative for speech difficulty, weakness and headaches.  Psychiatric/Behavioral: Negative for agitation, behavioral problems, confusion, hallucinations and sleep disturbance. The patient is not nervous/anxious.     Immunization History  Administered Date(s) Administered  . Influenza Whole 09/22/2008, 09/16/2012, 09/30/2013  . Influenza, High Dose Seasonal PF 09/25/2017  . Influenza-Unspecified 10/02/2014, 09/15/2015, 09/27/2016  . Pneumococcal Polysaccharide-23 08/17/2006  . Td 12/17/2005   Pertinent  Health Maintenance Due  Topic Date Due  . FOOT EXAM  07/19/1931  . OPHTHALMOLOGY EXAM  07/19/1931  . PNA vac Low Risk Adult (2 of 2 - PCV13) 08/18/2007  . URINE MICROALBUMIN  04/30/2008  . HEMOGLOBIN A1C  10/17/2017  . INFLUENZA VACCINE  Completed  . DEXA SCAN  Completed    Fall Risk  09/19/2017 05/17/2016 04/20/2016 04/19/2016  Falls in the past year? No No No No   Functional Status Survey:    Vitals:   03/06/18 1128  BP: (!) 142/72  Pulse: 68  Resp: 20  Temp: 97.6 F (36.4 C)  SpO2: 96%  Weight: 149 lb 14.4 oz (68 kg)  Height: 5' 5"  (1.651 m)   Body mass index is 24.94 kg/m. Physical Exam  Constitutional: She is oriented to person, place, and time. She appears well-developed and well-nourished. No distress.  HENT:  Head: Normocephalic and atraumatic.  Eyes: Pupils are equal, round, and reactive to light. Conjunctivae and EOM are normal.  Neck: Normal range of motion. Neck supple.  Cardiovascular: Normal rate and regular rhythm.  Ectopic heart beats.   Pulmonary/Chest: Effort normal and breath sounds normal. She has no wheezes. She has no rales.  Abdominal: Soft. Bowel sounds are normal. She exhibits no distension. There is no tenderness.  Musculoskeletal: She exhibits edema and tenderness.  Self transfer, wheelchair to get around, trace edema BLE  Neurological: She is alert and oriented to person, place, and time. She exhibits normal muscle tone. Coordination normal.  Skin: Skin is warm and dry. She is not diaphoretic.  Psychiatric: She has a normal mood and affect. Her behavior is normal.    Labs reviewed: Recent Labs    11/05/17 0346  12/24/17 0700 01/16/18 0842 01/23/18  NA 135   < > 135 136 138  K 3.9   < > 3.9 3.5 3.8  CL 109  --  97* 101  --   CO2 22  --  31 29  --   GLUCOSE 119*  --  79 99  --   BUN 34*   < > 15 18 19   CREATININE 0.84   < > 0.78 0.70 0.7  CALCIUM 8.2*  --  10.0 9.8  --    < > = values in this interval not displayed.   Recent Labs    07/24/17 0931 08/21/17 0000 11/03/17 1818 11/19/17 12/05/17 12/24/17 0700 01/23/18  AST 17  17 16 20 18 23 17  17 23   ALT 11  11 14  13* 12 20 12  12 16   ALKPHOS 90  90  --  78 74 97  --  217*  BILITOT 0.4  0.4 0.3 0.4  --   --  0.4  0.4  --   PROT 7.0  7.0 7.0 6.5   --   --  7.2  7.2  --   ALBUMIN 3.6  3.6  --  3.1*  --   --   --   --    Recent Labs    11/15/17 0357  12/24/17 0700 01/16/18 0842 01/23/18  WBC 9.6   < > 6.5 10.3 7.0  HGB 9.7*   < > 10.7* 11.0* 10.9*  HCT 29.9*   < > 31.9* 32.7* 32*  MCV 88.7  --  86.4 88.4  --   PLT 270   < > 389 278 369   < > = values in this interval not displayed.   Lab Results  Component Value Date   TSH 2.00 11/20/2017   Lab Results  Component Value Date   HGBA1C 5.3 04/16/2017   Lab Results  Component Value Date   CHOL 188 04/12/2016   HDL 62 04/12/2016   LDLCALC 102 04/12/2016   TRIG 122 04/12/2016   CHOLHDL 3.0 Ratio 06/07/2008    Significant Diagnostic Results in last 30 days:  No results found.  Assessment/Plan Chronic lower back pain  left lower back pain, the patient has been refusing Norco mid day dose and has no change of her pain. She is taking Norco 5/331m 1/2 tab tid for pain. Her mood is stable on Cymbalta 247mmay also helped with her back pain. Will change Norco 5/32598m/2 tab po bid. Observe the patient.     Depression with anxiety Her mood is stable, continue Cymbalta 66m40mily in setting of back pain. Observe.      Family/ staff Communication: plan of care reviewed with the patient and charge nurse.   Labs/tests ordered:  None   Time spend 25 minutes.

## 2018-03-07 NOTE — Assessment & Plan Note (Signed)
left lower back pain, the patient has been refusing Norco mid day dose and has no change of her pain. She is taking Norco 5/329m 1/2 tab tid for pain. Her mood is stable on Cymbalta 258mmay also helped with her back pain. Will change Norco 5/32570m/2 tab po bid. Observe the patient.

## 2018-03-07 NOTE — Assessment & Plan Note (Signed)
Her mood is stable, continue Cymbalta 20m daily in setting of back pain. Observe.

## 2018-03-17 DIAGNOSIS — K5731 Diverticulosis of large intestine without perforation or abscess with bleeding: Secondary | ICD-10-CM | POA: Diagnosis not present

## 2018-03-17 DIAGNOSIS — M6281 Muscle weakness (generalized): Secondary | ICD-10-CM | POA: Diagnosis not present

## 2018-03-17 DIAGNOSIS — R5383 Other fatigue: Secondary | ICD-10-CM | POA: Diagnosis not present

## 2018-03-17 DIAGNOSIS — R2681 Unsteadiness on feet: Secondary | ICD-10-CM | POA: Diagnosis not present

## 2018-03-17 DIAGNOSIS — M25552 Pain in left hip: Secondary | ICD-10-CM | POA: Diagnosis not present

## 2018-03-17 DIAGNOSIS — Z9181 History of falling: Secondary | ICD-10-CM | POA: Diagnosis not present

## 2018-03-17 DIAGNOSIS — N3946 Mixed incontinence: Secondary | ICD-10-CM | POA: Diagnosis not present

## 2018-03-17 DIAGNOSIS — R531 Weakness: Secondary | ICD-10-CM | POA: Diagnosis not present

## 2018-03-17 DIAGNOSIS — K921 Melena: Secondary | ICD-10-CM | POA: Diagnosis not present

## 2018-03-17 DIAGNOSIS — R1311 Dysphagia, oral phase: Secondary | ICD-10-CM | POA: Diagnosis not present

## 2018-03-18 DIAGNOSIS — R2681 Unsteadiness on feet: Secondary | ICD-10-CM | POA: Diagnosis not present

## 2018-03-18 DIAGNOSIS — R531 Weakness: Secondary | ICD-10-CM | POA: Diagnosis not present

## 2018-03-18 DIAGNOSIS — M25552 Pain in left hip: Secondary | ICD-10-CM | POA: Diagnosis not present

## 2018-03-18 DIAGNOSIS — Z9181 History of falling: Secondary | ICD-10-CM | POA: Diagnosis not present

## 2018-03-18 DIAGNOSIS — R5383 Other fatigue: Secondary | ICD-10-CM | POA: Diagnosis not present

## 2018-03-18 DIAGNOSIS — K921 Melena: Secondary | ICD-10-CM | POA: Diagnosis not present

## 2018-03-18 DIAGNOSIS — M6281 Muscle weakness (generalized): Secondary | ICD-10-CM | POA: Diagnosis not present

## 2018-03-18 DIAGNOSIS — K5731 Diverticulosis of large intestine without perforation or abscess with bleeding: Secondary | ICD-10-CM | POA: Diagnosis not present

## 2018-03-18 DIAGNOSIS — N3946 Mixed incontinence: Secondary | ICD-10-CM | POA: Diagnosis not present

## 2018-03-18 DIAGNOSIS — R1311 Dysphagia, oral phase: Secondary | ICD-10-CM | POA: Diagnosis not present

## 2018-03-19 DIAGNOSIS — M25552 Pain in left hip: Secondary | ICD-10-CM | POA: Diagnosis not present

## 2018-03-19 DIAGNOSIS — R5383 Other fatigue: Secondary | ICD-10-CM | POA: Diagnosis not present

## 2018-03-19 DIAGNOSIS — R2681 Unsteadiness on feet: Secondary | ICD-10-CM | POA: Diagnosis not present

## 2018-03-19 DIAGNOSIS — Z9181 History of falling: Secondary | ICD-10-CM | POA: Diagnosis not present

## 2018-03-19 DIAGNOSIS — R531 Weakness: Secondary | ICD-10-CM | POA: Diagnosis not present

## 2018-03-19 DIAGNOSIS — N3946 Mixed incontinence: Secondary | ICD-10-CM | POA: Diagnosis not present

## 2018-03-19 DIAGNOSIS — K921 Melena: Secondary | ICD-10-CM | POA: Diagnosis not present

## 2018-03-19 DIAGNOSIS — M6281 Muscle weakness (generalized): Secondary | ICD-10-CM | POA: Diagnosis not present

## 2018-03-19 DIAGNOSIS — R1311 Dysphagia, oral phase: Secondary | ICD-10-CM | POA: Diagnosis not present

## 2018-03-19 DIAGNOSIS — K5731 Diverticulosis of large intestine without perforation or abscess with bleeding: Secondary | ICD-10-CM | POA: Diagnosis not present

## 2018-03-20 DIAGNOSIS — R1311 Dysphagia, oral phase: Secondary | ICD-10-CM | POA: Diagnosis not present

## 2018-03-20 DIAGNOSIS — K5731 Diverticulosis of large intestine without perforation or abscess with bleeding: Secondary | ICD-10-CM | POA: Diagnosis not present

## 2018-03-20 DIAGNOSIS — M6281 Muscle weakness (generalized): Secondary | ICD-10-CM | POA: Diagnosis not present

## 2018-03-20 DIAGNOSIS — Z9181 History of falling: Secondary | ICD-10-CM | POA: Diagnosis not present

## 2018-03-20 DIAGNOSIS — N3946 Mixed incontinence: Secondary | ICD-10-CM | POA: Diagnosis not present

## 2018-03-20 DIAGNOSIS — R2681 Unsteadiness on feet: Secondary | ICD-10-CM | POA: Diagnosis not present

## 2018-03-20 DIAGNOSIS — R531 Weakness: Secondary | ICD-10-CM | POA: Diagnosis not present

## 2018-03-20 DIAGNOSIS — K921 Melena: Secondary | ICD-10-CM | POA: Diagnosis not present

## 2018-03-20 DIAGNOSIS — R5383 Other fatigue: Secondary | ICD-10-CM | POA: Diagnosis not present

## 2018-03-20 DIAGNOSIS — M25552 Pain in left hip: Secondary | ICD-10-CM | POA: Diagnosis not present

## 2018-03-21 ENCOUNTER — Non-Acute Institutional Stay (SKILLED_NURSING_FACILITY): Payer: Medicare HMO | Admitting: Nurse Practitioner

## 2018-03-21 ENCOUNTER — Encounter: Payer: Self-pay | Admitting: Nurse Practitioner

## 2018-03-21 DIAGNOSIS — M5440 Lumbago with sciatica, unspecified side: Secondary | ICD-10-CM | POA: Diagnosis not present

## 2018-03-21 DIAGNOSIS — I10 Essential (primary) hypertension: Secondary | ICD-10-CM | POA: Diagnosis not present

## 2018-03-21 DIAGNOSIS — K5900 Constipation, unspecified: Secondary | ICD-10-CM | POA: Diagnosis not present

## 2018-03-21 DIAGNOSIS — R140 Abdominal distension (gaseous): Secondary | ICD-10-CM | POA: Insufficient documentation

## 2018-03-21 DIAGNOSIS — G8929 Other chronic pain: Secondary | ICD-10-CM

## 2018-03-21 DIAGNOSIS — E871 Hypo-osmolality and hyponatremia: Secondary | ICD-10-CM

## 2018-03-21 DIAGNOSIS — K219 Gastro-esophageal reflux disease without esophagitis: Secondary | ICD-10-CM | POA: Diagnosis not present

## 2018-03-21 DIAGNOSIS — J3089 Other allergic rhinitis: Secondary | ICD-10-CM | POA: Diagnosis not present

## 2018-03-21 DIAGNOSIS — I471 Supraventricular tachycardia: Secondary | ICD-10-CM | POA: Diagnosis not present

## 2018-03-21 DIAGNOSIS — R69 Illness, unspecified: Secondary | ICD-10-CM | POA: Diagnosis not present

## 2018-03-21 DIAGNOSIS — F418 Other specified anxiety disorders: Secondary | ICD-10-CM

## 2018-03-21 DIAGNOSIS — D500 Iron deficiency anemia secondary to blood loss (chronic): Secondary | ICD-10-CM

## 2018-03-21 NOTE — Assessment & Plan Note (Signed)
hx of HTN, blood pressure is controlled, continue Atenolol 62m qd

## 2018-03-21 NOTE — Assessment & Plan Note (Signed)
Normalized Na 138 01/23/18, f/u CMP

## 2018-03-21 NOTE — Assessment & Plan Note (Signed)
Heart rate is in control, continue Atenolol 107m qd.

## 2018-03-21 NOTE — Progress Notes (Signed)
Location:  [facility] Room Number: 17 Place of Service:  SNF (31)  Provider: Marlana Latus  NP  PCP: Blanchie Serve, MD Patient Care Team: Blanchie Serve, MD as PCP - General (Internal Medicine) Lanelle Bal., MD as Referring Physician (Internal Medicine) Satira Sark, MD as Consulting Physician (Cardiology) Kyung Rudd, MD as Consulting Physician (Radiation Oncology)  Extended Emergency Contact Information Primary Emergency Contact: Neita Garnet of [place] Phone: [phone] Relation: Son Secondary Emergency Contact: Marquis,Lance Address: [address]          [place], [place] [address] [name] of [place] Phone: [phone] Mobile Phone: [phone] Relation: Son  Code Status: DNR Goals of care:  Advanced Directive information Advanced Directives 02/26/2018  Does Patient Have a Medical Advance Directive? Yes  Type of Advance Directive Living will;Out of facility DNR (pink MOST or yellow form)  Does patient want to make changes to medical advance directive? No - Patient declined  Copy of [place] in Chart? -  Would patient like information on creating a medical advance directive? -  Pre-existing out of facility DNR order (yellow form or pink MOST form) Yellow form placed in chart (order not valid for inpatient use)     Allergies  Allergen Reactions  . Augmentin [Amoxicillin-Pot Clavulanate] Other (See Comments)    unknown  . Ibuprofen Hives  . Naproxen Other (See Comments)    unknown  . Statins Other (See Comments)    Feels like knives in stomach  . Surgilube [Gyne-Moistrin]     Rectal itching, burning  . Tape Rash    Chief Complaint  Patient presents with  . Discharge Note    To [facility]    HPI:  84 y.o. female seen today for discharge to [facility] visit. She  was admitted to [facility] 01/16/18 following ED visit for lower back and left hip pain, imaging showed left superior and  inferior pubic ramus deformity consistent with fractures of indeterminate age. She has received therapy,  is getting stronger what able to function at assisted living level,  her pain is well managed with Gabapentin 18m tid, Norco 5/3272m1/2 bid. She uses wheelchair and electric scooter to get around.   C/o bloated stomach sometimes, desires as needed Gas X  Other medical issues, hx of HTN, blood pressure is controlled on Atenolol 254md, chronic rhinitis, stable on Zyrtec 21m72m, her mood is stable, on Cymbalta 20mg54m anemia, Hgb 10.9 01/23/18, on Fe 27mg 17EYFolic acid 400mc814GYJB12 1E56mcg5m No constipation while on MiraLax daily, her bowel pattern is 1x/2-3 days. GERD stable on Omeprazole 20mg q43m   Past Medical History:  Diagnosis Date  . Allergic rhinitis   . Anemia    NOS  . Anxiety   . Biceps tendon rupture    Bilateral  . Breast cancer (HCC) 20[place]  Left s/p lumpectomy and XRT   . Bronchiectasis (HCC) 20[place]  Noted on chest x-ray  . Complication of anesthesia    slow to wake up  . Cough 2014  . Depression   . Diverticulosis   . Elevated liver enzymes    Biopsy consistent with steatohepatitis  . Fundic gland polyps of stomach, benign   . GERD (gastroesophageal reflux disease) 06/2007   EGD Dr Rourk >Gala Romney, multiple fundic gland polyps  . Glaucoma 2013   both eyes  . Hemorrhoids   . Hiatal hernia   .  Hx of radiation therapy 07/17/10 to 07/21/10   SRS LLL lung  . Hyperlipidemia   . Insomnia   . Low back pain    scoliosis  . Lung cancer ([place]) 05/2010   Left 2011 - rad x 3  . Lymphedema    Left arm  . Osteoarthritis   . Osteopenia 10/25/2016  . Osteoporosis, senile   . Overactive bladder   . Pneumonia    RLL with sepsis  . PSVT (paroxysmal supraventricular tachycardia) ([place])   . Rheumatic fever    Age 84  . Right thyroid nodule   . Steatohepatitis    liver bx  . Type 2 diabetes mellitus ([place])     Past Surgical History:  Procedure Laterality Date  .  ABDOMINAL HYSTERECTOMY  1964  . APPENDECTOMY  1964  . BIOPSY THYROID  11/2008  . BREAST BIOPSY     X 3 w/ cystectomy  . BREAST LUMPECTOMY  2001  . CATARACT EXTRACTION Bilateral 2003   Lens implant Dr. Charise Killian  . CHOLECYSTECTOMY  1965  . COLONOSCOPY  09/11/2011   Procedure: COLONOSCOPY;  Surgeon: Dorothyann Peng, MD;  Location: [facility];  Service: Endoscopy;  Laterality: N/A;  . COLONOSCOPY  2005 /2012   Dr. Lucianne Muss- diverticulosis, ext hemorrhoids.  . COLONOSCOPY WITH PROPOFOL N/A 11/09/2017   Procedure: COLONOSCOPY WITH PROPOFOL;  Surgeon: Mauri Pole, MD;  Location: [facility] ENDOSCOPY;  Service: Endoscopy;  Laterality: N/A;  . COLONOSCOPY WITH PROPOFOL N/A 11/12/2017   Procedure: COLONOSCOPY WITH PROPOFOL;  Surgeon: Ladene Artist, MD;  Location: [facility];  Service: Endoscopy;  Laterality: N/A;  . CYSTOSCOPY  2007  . ESOPHAGOGASTRODUODENOSCOPY  09/11/2011   Procedure: ESOPHAGOGASTRODUODENOSCOPY (EGD);  Surgeon: Dorothyann Peng, MD;  Location[facility];  Service: Endoscopy;  Laterality: N/A;  . ESOPHAGOGASTRODUODENOSCOPY (EGD) WITH PROPOFOL N/A 11/08/2017   Procedure: ESOPHAGOGASTRODUODENOSCOPY (EGD) WITH PROPOFOL;  Surgeon: Mauri Pole, MD;  Location: [facility];  Service: Endoscopy;  Laterality: N/A;  . LIVER BIOPSY  2008  . LUNG BIOPSY Left 06/06/2010  . REVISION TOTAL HIP ARTHROPLASTY  2007   Left  . SKIN CANCER EXCISION     nose and left lower cheek  . TONSILLECTOMY  1930  . TOTAL HIP ARTHROPLASTY Bilateral 2005 & 2007    Dr. Earl Lagos. Aline Brochure       reports that she has never smoked. She has never used smokeless tobacco. She reports that she does not drink alcohol or use drugs. Social History   Socioeconomic History  . Marital status: Widowed    Spouse name: Not on file  . Number of children: 2  . Years of education: college   . Highest education level: Not on file  Occupational History  . Occupation: retired Tax Comptroller: RETIRED    Social Needs  . Financial resource strain: Not on file  . Food insecurity:    Worry: Not on file    Inability: Not on file  . Transportation needs:    Medical: Not on file    Non-medical: Not on file  Tobacco Use  . Smoking status: Never Smoker  . Smokeless tobacco: Never Used  . Tobacco comment: 2nd hand-husband  Substance and Sexual Activity  . Alcohol use: No  . Drug use: No  . Sexual activity: Not Currently  Lifestyle  . Physical activity:    Days per week: Not on file    Minutes per session: Not on file  . Stress: Not on  file  Relationships  . Social connections:    Talks on phone: Not on file    Gets together: Not on file    Attends religious service: Not on file    Active member of club or organization: Not on file    Attends meetings of clubs or organizations: Not on file    Relationship status: Not on file  . Intimate partner violence:    Fear of current or ex partner: Not on file    Emotionally abused: Not on file    Physically abused: Not on file    Forced sexual activity: Not on file  Other Topics Concern  . Not on file  Social History Narrative   Lives at [facility] 02/2013   2 adopted children   Was in [place], [place]   Widowed 2013   Walks with walker   Exercise: none    POA - Living Will   Functional Status Survey:    Allergies  Allergen Reactions  . Augmentin [Amoxicillin-Pot Clavulanate] Other (See Comments)    unknown  . Ibuprofen Hives  . Naproxen Other (See Comments)    unknown  . Statins Other (See Comments)    Feels like knives in stomach  . Surgilube [Gyne-Moistrin]     Rectal itching, burning  . Tape Rash    Pertinent  Health Maintenance Due  Topic Date Due  . FOOT EXAM  07/19/1931  . OPHTHALMOLOGY EXAM  07/19/1931  . PNA vac Low Risk Adult (2 of 2 - PCV13) 08/18/2007  . URINE MICROALBUMIN  04/30/2008  . HEMOGLOBIN A1C  10/17/2017  . INFLUENZA VACCINE  07/17/2018  . DEXA SCAN  Completed     Medications: Outpatient Encounter Medications as of 03/21/2018  Medication Sig  . atenolol (TENORMIN) 25 MG tablet Take 25 mg by mouth daily.  . cetirizine (ZYRTEC) 10 MG tablet Take 10 mg by mouth daily.  . dorzolamide-timolol (COSOPT) 22.3-6.8 MG/ML ophthalmic solution Place 1 drop into both eyes 2 (two) times daily.   . DULoxetine (CYMBALTA) 20 MG capsule TAKE (1) CAPSULE DAILY.  Marland Kitchen Ferrous Sulfate 27 MG TABS Take 1 tablet by mouth daily.  . folic acid (FOLVITE) 329 MCG tablet Take 400 mcg by mouth daily.  Marland Kitchen gabapentin (NEURONTIN) 100 MG capsule Take 1 capsule (100 mg total) by mouth 3 (three) times daily.  Marland Kitchen HYDROcodone-acetaminophen (NORCO/VICODIN) 5-325 MG tablet Take 1 tablet by mouth 3 (three) times daily.   Marland Kitchen lactose free nutrition (BOOST) LIQD Take 237 mLs by mouth 2 (two) times daily between meals.  . latanoprost (XALATAN) 0.005 % ophthalmic solution Place 1 drop into both eyes at bedtime.  . lidocaine (LIDODERM) 5 % Place 1 patch onto the skin daily. Remove & Discard patch within 12 hours or as directed by MD  . Multiple Vitamins-Minerals (PRESERVISION AREDS 2 PO) Take 1 capsule by mouth 2 (two) times daily.   Marland Kitchen omeprazole (PRILOSEC) 20 MG capsule TAKE 1 CAPSULE EVERY DAY.  Marland Kitchen polyethylene glycol (MIRALAX / GLYCOLAX) packet Take 17 g by mouth daily.  . vitamin B-12 (CYANOCOBALAMIN) 1000 MCG tablet Take 1,000 mcg by mouth daily.  . [DISCONTINUED] hydrocortisone (ANUSOL-HC) 2.5 % rectal cream Place 1 application rectally 2 (two) times daily.   No facility-administered encounter medications on file as of 03/21/2018.     Review of Systems  Constitutional: Negative for activity change, appetite change, chills, diaphoresis, fatigue and fever.  HENT: Positive for hearing loss and postnasal drip. Negative for congestion, rhinorrhea, sinus  pressure, sinus pain, sore throat, trouble swallowing and voice change.   Eyes: Negative for visual disturbance.  Respiratory: Negative for cough,  choking, chest tightness, shortness of breath and wheezing.   Cardiovascular: Positive for leg swelling. Negative for chest pain and palpitations.  Gastrointestinal: Negative for abdominal distention, abdominal pain, constipation, diarrhea, nausea and vomiting.       Bloated sometimes  Endocrine: Negative for cold intolerance.  Genitourinary: Positive for frequency. Negative for dysuria and urgency.       2-3x/night.   Musculoskeletal: Positive for back pain and gait problem.  Skin: Negative for color change and pallor.  Neurological: Negative for dizziness, speech difficulty, weakness and headaches.  Psychiatric/Behavioral: Negative for agitation, behavioral problems, confusion, hallucinations and sleep disturbance. The patient is not nervous/anxious.     Vitals:   03/21/18 1009  BP: 132/70  Pulse: 72  Resp: 20  Temp: 97.9 F (36.6 C)  SpO2: 96%  Weight: 151 lb 6.4 oz (68.7 kg)  Height: 5' 5"  (1.651 m)   Body mass index is 25.19 kg/m. Physical Exam  Constitutional: She is oriented to person, place, and time. She appears well-developed and well-nourished.  HENT:  Head: Normocephalic and atraumatic.  Mouth/Throat: Oropharynx is clear and moist. No oropharyngeal exudate.  Eyes: Pupils are equal, round, and reactive to light. Conjunctivae and EOM are normal.  Neck: Normal range of motion. Neck supple. No JVD present. No thyromegaly present.  Cardiovascular: Normal rate.  No murmur heard. Frequent ectopic heart beats  Pulmonary/Chest: Effort normal and breath sounds normal. She has no wheezes. She has no rales.  Abdominal: Soft. Bowel sounds are normal. She exhibits no distension. There is no tenderness. There is no rebound and no guarding.  Musculoskeletal: She exhibits edema.  Trace edema BLE. Wheelchair to get around.   Neurological: She is alert and oriented to person, place, and time. She exhibits normal muscle tone. Coordination normal.  Skin: Skin is warm and dry.   Psychiatric: She has a normal mood and affect. Her behavior is normal. Judgment and thought content normal.    Labs reviewed: Basic Metabolic Panel: Recent Labs    11/05/17 0346  12/24/17 0700 01/16/18 0842 01/23/18  NA 135   < > 135 136 138  K 3.9   < > 3.9 3.5 3.8  CL 109  --  97* 101  --   CO2 22  --  31 29  --   GLUCOSE 119*  --  79 99  --   BUN 34*   < > 15 18 19   CREATININE 0.84   < > 0.78 0.70 0.7  CALCIUM 8.2*  --  10.0 9.8  --    < > = values in this interval not displayed.   Liver Function Tests: Recent Labs    07/24/17 0931 08/21/17 0000 11/03/17 1818 11/19/17 12/05/17 12/24/17 0700 01/23/18  AST 17  17 16 20 18 23 17  17 23   ALT 11  11 14  13* 12 20 12  12 16   ALKPHOS 90  90  --  78 74 97  --  217*  BILITOT 0.4  0.4 0.3 0.4  --   --  0.4  0.4  --   PROT 7.0  7.0 7.0 6.5  --   --  7.2  7.2  --   ALBUMIN 3.6  3.6  --  3.1*  --   --   --   --    No results for input(s): LIPASE, AMYLASE in  the last 8760 hours. No results for input(s): AMMONIA in the last 8760 hours. CBC: Recent Labs    11/15/17 0357  12/24/17 0700 01/16/18 0842 01/23/18  WBC 9.6   < > 6.5 10.3 7.0  HGB 9.7*   < > 10.7* 11.0* 10.9*  HCT 29.9*   < > 31.9* 32.7* 32*  MCV 88.7  --  86.4 88.4  --   PLT 270   < > 389 278 369   < > = values in this interval not displayed.   Cardiac Enzymes: No results for input(s): CKTOTAL, CKMB, CKMBINDEX, TROPONINI in the last 8760 hours. BNP: Invalid input(s): POCBNP CBG: Recent Labs    11/06/17 1122 11/06/17 1524 11/09/17 0732  GLUCAP 91 89 80    Procedures and Imaging Studies During Stay: No results found.  Assessment/Plan:   HTN (hypertension) hx of HTN, blood pressure is controlled, continue Atenolol 46m qd  PSVT (paroxysmal supraventricular tachycardia) (HCC) Heart rate is in control, continue Atenolol 246mqd.   Allergic rhinitis chronic rhinitis, stable, continue Zyrtec 1066md  Depression with anxiety her mood is  stalbe, continue  Cymbalta 33m44m  Chronic lower back pain Well controlled, continue Gabapentin 100mg82m, Norco 5/325mg 70mbid.   Blood loss anemia anemia, Hgb 10.9 01/23/18, continue Fe 27mg q23JSolic acid 400mcg283TDV12 10V61cg 93mupdate CBC  Hyponatremia Normalized Na 138 01/23/18, f/u CMP  Constipation No constipation, continue  MiraLax daily, her bowel pattern is 1x/2-3 days.   GERD without esophagitis GERD stable, continue Omeprazole 33mg qd32mBloated abdomen Sometimes, will have Simethicone 80mg qid45m, chew before swallow, give after meals or at bed time, observe.      Patient is being discharged with the following home health services:    Patient is being discharged with the following durable medical equipment:    Patient has been advised to f/u with their PCP in 1-2 weeks to for a transitions of care visit.  Social services at their facility was responsible for arranging this appointment.  Pt was provided with adequate prescriptions of noncontrolled medications to reach the scheduled appointment .  For controlled substances, a limited supply was provided as appropriate for the individual patient.  If the pt normally receives these medications from a pain clinic or has a contract with another physician, these medications should be received from that clinic or physician only).    Future labs/tests needed:  None  Time spend 25 minute.s

## 2018-03-21 NOTE — Assessment & Plan Note (Signed)
Sometimes, will have Simethicone 62m qid prn, chew before swallow, give after meals or at bed time, observe.

## 2018-03-21 NOTE — Assessment & Plan Note (Signed)
chronic rhinitis, stable, continue Zyrtec 53m qd

## 2018-03-21 NOTE — Assessment & Plan Note (Signed)
Well controlled, continue Gabapentin 173m tid, Norco 5/3256m1/2 bid.

## 2018-03-21 NOTE — Assessment & Plan Note (Signed)
anemia, Hgb 10.9 01/23/18, continue Fe 30QM qd, Folic acid 578ION Vit G29 105mg qd, update CBC

## 2018-03-21 NOTE — Assessment & Plan Note (Signed)
her mood is stalbe, continue  Cymbalta 66m qd

## 2018-03-21 NOTE — Assessment & Plan Note (Signed)
GERD stable, continue Omeprazole 70m qd.

## 2018-03-21 NOTE — Assessment & Plan Note (Signed)
No constipation, continue  MiraLax daily, her bowel pattern is 1x/2-3 days.

## 2018-03-25 ENCOUNTER — Other Ambulatory Visit: Payer: Self-pay | Admitting: *Deleted

## 2018-03-25 DIAGNOSIS — D500 Iron deficiency anemia secondary to blood loss (chronic): Secondary | ICD-10-CM | POA: Diagnosis not present

## 2018-03-25 DIAGNOSIS — H401131 Primary open-angle glaucoma, bilateral, mild stage: Secondary | ICD-10-CM | POA: Diagnosis not present

## 2018-03-25 DIAGNOSIS — H3530 Unspecified macular degeneration: Secondary | ICD-10-CM | POA: Diagnosis not present

## 2018-03-25 LAB — HEPATIC FUNCTION PANEL
ALK PHOS: 133 — AB (ref 25–125)
ALT: 13 (ref 7–35)
AST: 18 (ref 13–35)
Bilirubin, Total: 0.9

## 2018-03-25 LAB — CBC AND DIFFERENTIAL
HCT: 29 — AB (ref 36–46)
Hemoglobin: 9.3 — AB (ref 12.0–16.0)
PLATELETS: 341 (ref 150–399)
WBC: 7.3

## 2018-03-25 LAB — BASIC METABOLIC PANEL
BUN: 11 (ref 4–21)
CREATININE: 0.7 (ref <=1.1)
Glucose: 85
POTASSIUM: 3.2 — AB (ref 3.4–5.3)
Sodium: 134 — AB (ref 137–147)

## 2018-03-26 ENCOUNTER — Encounter: Payer: Self-pay | Admitting: Nurse Practitioner

## 2018-03-26 ENCOUNTER — Non-Acute Institutional Stay: Payer: Medicare HMO | Admitting: Nurse Practitioner

## 2018-03-26 DIAGNOSIS — D500 Iron deficiency anemia secondary to blood loss (chronic): Secondary | ICD-10-CM

## 2018-03-26 DIAGNOSIS — E871 Hypo-osmolality and hyponatremia: Secondary | ICD-10-CM | POA: Diagnosis not present

## 2018-03-26 DIAGNOSIS — E876 Hypokalemia: Secondary | ICD-10-CM | POA: Diagnosis not present

## 2018-03-26 NOTE — Progress Notes (Signed)
Location:  [facility] Room Number: 916 Place of Service:  ALF [phone]) Provider:  Lennis Rader, [place]  NP  Blanchie Serve, MD  Patient Care Team: Blanchie Serve, MD as PCP - General (Internal Medicine) Lanelle Bal., MD as Referring Physician (Internal Medicine) Satira Sark, MD as Consulting Physician (Cardiology) Kyung Rudd, MD as Consulting Physician (Radiation Oncology)  Extended Emergency Contact Information Primary Emergency Contact: Neita Garnet of [place] Phone: [phone] Relation: Son Secondary Emergency Contact: Petrakis,Lance Address: [address]          [place], [place] [address] [name] of [place] Phone: [phone] Mobile Phone: [phone] Relation: Son  Code Status:  DNR Goals of care: Advanced Directive information Advanced Directives 03/26/2018  Does Patient Have a Medical Advance Directive? Yes  Type of Advance Directive Living will;Out of facility DNR (pink MOST or yellow form)  Does patient want to make changes to medical advance directive? No - Patient declined  Copy of [place] in Chart? -  Would patient like information on creating a medical advance directive? -  Pre-existing out of facility DNR order (yellow form or pink MOST form) Yellow form placed in chart (order not valid for inpatient use)     Chief Complaint  Patient presents with  . Acute Visit    low potassium, low sodium, anemia    HPI:  Pt is a 84 y.o. female seen today for an acute visit for the patient c/o fatigue for the past 2-3 days, but she stated feels better today. Hx of hyponatremia, 03/25/18 Na 134. 03/25/18  low potassium 3.2 is new, she denied muscle cramps, GI symptoms, palpitation, tingling/numbness, or breath difficulties. Hx of anemia, on Fe, Folic acid, Vit S56, last Hgb 10s in Feb 2019   Past Medical History:  Diagnosis Date  . Allergic rhinitis   . Anemia    NOS  . Anxiety   . Biceps  tendon rupture    Bilateral  . Breast cancer ([place]) 2001   Left s/p lumpectomy and XRT   . Bronchiectasis ([place]) 2014   Noted on chest x-ray  . Complication of anesthesia    slow to wake up  . Cough 2014  . Depression   . Diverticulosis   . Elevated liver enzymes    Biopsy consistent with steatohepatitis  . Fundic gland polyps of stomach, benign   . GERD (gastroesophageal reflux disease) 06/2007   EGD Dr Gala Romney >sm HH, multiple fundic gland polyps  . Glaucoma 2013   both eyes  . Hemorrhoids   . Hiatal hernia   . Hx of radiation therapy 07/17/10 to 07/21/10   SRS LLL lung  . Hyperlipidemia   . Insomnia   . Low back pain    scoliosis  . Lung cancer ([place]) 05/2010   Left 2011 - rad x 3  . Lymphedema    Left arm  . Osteoarthritis   . Osteopenia 10/25/2016  . Osteoporosis, senile   . Overactive bladder   . Pneumonia    RLL with sepsis  . PSVT (paroxysmal supraventricular tachycardia) ([place])   . Rheumatic fever    Age 30  . Right thyroid nodule   . Steatohepatitis    liver bx  . Type 2 diabetes mellitus ([place])    Past Surgical History:  Procedure Laterality Date  . ABDOMINAL HYSTERECTOMY  1964  . APPENDECTOMY  1964  . BIOPSY THYROID  11/2008  . BREAST BIOPSY  X 3 w/ cystectomy  . BREAST LUMPECTOMY  2001  . CATARACT EXTRACTION Bilateral 2003   Lens implant Dr. Charise Killian  . CHOLECYSTECTOMY  1965  . COLONOSCOPY  09/11/2011   Procedure: COLONOSCOPY;  Surgeon: Dorothyann Peng, MD;  [facility];  Service: Endoscopy;  Laterality: N/A;  . COLONOSCOPY  2005 /2012   Dr. Lucianne Muss- diverticulosis, ext hemorrhoids.  . COLONOSCOPY WITH PROPOFOL N/A 11/09/2017   Procedure: COLONOSCOPY WITH PROPOFOL;  Surgeon: Mauri Pole, MD;  [facility];  Service: Endoscopy;  Laterality: N/A;  . COLONOSCOPY WITH PROPOFOL N/A 11/12/2017   Procedure: COLONOSCOPY WITH PROPOFOL;  Surgeon: Ladene Artist, MD;  [facility];  Service: Endoscopy;  Laterality: N/A;  .  CYSTOSCOPY  2007  . ESOPHAGOGASTRODUODENOSCOPY  09/11/2011   Procedure: ESOPHAGOGASTRODUODENOSCOPY (EGD);  Surgeon: Dorothyann Peng, MD;  [facility];  Service: Endoscopy;  Laterality: N/A;  . ESOPHAGOGASTRODUODENOSCOPY (EGD) WITH PROPOFOL N/A 11/08/2017   Procedure: ESOPHAGOGASTRODUODENOSCOPY (EGD) WITH PROPOFOL;  Surgeon: Mauri Pole, MD;  [facility];  Service: Endoscopy;  Laterality: N/A;  . LIVER BIOPSY  2008  . LUNG BIOPSY Left 06/06/2010  . REVISION TOTAL HIP ARTHROPLASTY  2007   Left  . SKIN CANCER EXCISION     nose and left lower cheek  . TONSILLECTOMY  1930  . TOTAL HIP ARTHROPLASTY Bilateral 2005 & 2007    Dr. Earl Lagos. Aline Brochure     Allergies  Allergen Reactions  . Augmentin [Amoxicillin-Pot Clavulanate] Other (See Comments)    unknown  . Ibuprofen Hives  . Naproxen Other (See Comments)    unknown  . Statins Other (See Comments)    Feels like knives in stomach  . Surgilube [Gyne-Moistrin]     Rectal itching, burning  . Tape Rash    Outpatient Encounter Medications as of 03/26/2018  Medication Sig  . atenolol (TENORMIN) 25 MG tablet Take 25 mg by mouth daily.  . cetirizine (ZYRTEC) 10 MG tablet Take 10 mg by mouth daily.  . dorzolamide-timolol (COSOPT) 22.3-6.8 MG/ML ophthalmic solution Place 1 drop into both eyes 2 (two) times daily.   . DULoxetine (CYMBALTA) 20 MG capsule TAKE (1) CAPSULE DAILY.  Marland Kitchen Ferrous Sulfate 27 MG TABS Take 1 tablet by mouth daily.  . folic acid (FOLVITE) 109 MCG tablet Take 400 mcg by mouth daily.  Marland Kitchen gabapentin (NEURONTIN) 100 MG capsule Take 1 capsule (100 mg total) by mouth 3 (three) times daily.  Marland Kitchen HYDROcodone-acetaminophen (NORCO/VICODIN) 5-325 MG tablet Take 1 tablet by mouth 3 (three) times daily.   Marland Kitchen lactose free nutrition (BOOST) LIQD Take 237 mLs by mouth 2 (two) times daily between meals.  . latanoprost (XALATAN) 0.005 % ophthalmic solution Place 1 drop into both eyes at bedtime.  . Multiple  Vitamins-Minerals (PRESERVISION AREDS 2 PO) Take 1 capsule by mouth 2 (two) times daily.   Marland Kitchen omeprazole (PRILOSEC) 20 MG capsule TAKE 1 CAPSULE EVERY DAY.  Marland Kitchen polyethylene glycol (MIRALAX / GLYCOLAX) packet Take 17 g by mouth daily.  . vitamin B-12 (CYANOCOBALAMIN) 1000 MCG tablet Take 1,000 mcg by mouth daily.  . [DISCONTINUED] lidocaine (LIDODERM) 5 % Place 1 patch onto the skin daily. Remove & Discard patch within 12 hours or as directed by MD   No facility-administered encounter medications on file as of 03/26/2018.     Review of Systems  Constitutional: Positive for fatigue. Negative for activity change, appetite change, chills, diaphoresis and fever.  HENT: Positive for hearing loss. Negative for congestion,  trouble swallowing and voice change.   Respiratory: Negative for cough, choking, chest tightness and wheezing.   Cardiovascular: Negative for chest pain and leg swelling.  Gastrointestinal: Negative for abdominal distention, abdominal pain, blood in stool, constipation, diarrhea, nausea and vomiting.  Genitourinary: Negative for dysuria, hematuria and urgency.  Musculoskeletal: Positive for arthralgias, back pain and gait problem.  Neurological: Negative for dizziness, speech difficulty, weakness, numbness and headaches.  Psychiatric/Behavioral: Negative for agitation, behavioral problems, confusion, hallucinations and sleep disturbance. The patient is not nervous/anxious.     Immunization History  Administered Date(s) Administered  . Influenza Whole 09/22/2008, 09/16/2012, 09/30/2013  . Influenza, High Dose Seasonal PF 09/25/2017  . Influenza-Unspecified 10/02/2014, 09/15/2015, 09/27/2016  . Pneumococcal Polysaccharide-23 08/17/2006  . Td 12/17/2005   Pertinent  Health Maintenance Due  Topic Date Due  . FOOT EXAM  07/19/1931  . OPHTHALMOLOGY EXAM  07/19/1931  . PNA vac Low Risk Adult (2 of 2 - PCV13) 08/18/2007  . URINE MICROALBUMIN  04/30/2008  . HEMOGLOBIN A1C   10/17/2017  . INFLUENZA VACCINE  07/17/2018  . DEXA SCAN  Completed   Fall Risk  09/19/2017 05/17/2016 04/20/2016 04/19/2016  Falls in the past year? No No No No   Functional Status Survey:    Vitals:   03/26/18 1215  BP: (!) 144/70  Pulse: 83  Resp: 20  Temp: 98.3 F (36.8 C)  SpO2: 93%  Weight: 150 lb 6.4 oz (68.2 kg)  Height: 5' 5"  (1.651 m)   Body mass index is 25.03 kg/m. Physical Exam  Constitutional: She is oriented to person, place, and time. She appears well-developed and well-nourished.  HENT:  Head: Normocephalic and atraumatic.  Eyes: Pupils are equal, round, and reactive to light. EOM are normal. No scleral icterus.  Neck: Normal range of motion. Neck supple. No JVD present.  Cardiovascular: Normal rate.  No murmur heard. Irregular heart beats.   Pulmonary/Chest: Effort normal and breath sounds normal. She has no wheezes.  Abdominal: Soft. Bowel sounds are normal.  Musculoskeletal: She exhibits edema.  Trace edema BLE, electric scooter to get around.   Neurological: She is alert and oriented to person, place, and time. She exhibits normal muscle tone. Coordination normal.  Skin: Skin is warm and dry.  Psychiatric: She has a normal mood and affect. Her behavior is normal. Judgment and thought content normal.    Labs reviewed: Recent Labs    11/05/17 0346  12/24/17 0700 01/16/18 0842 01/23/18 03/25/18  NA 135   < > 135 136 138 134*  K 3.9   < > 3.9 3.5 3.8 3.2*  CL 109  --  97* 101  --   --   CO2 22  --  31 29  --   --   GLUCOSE 119*  --  79 99  --   --   BUN 34*   < > 15 18 19 11   CREATININE 0.84   < > 0.78 0.70 0.7 0.7  CALCIUM 8.2*  --  10.0 9.8  --   --    < > = values in this interval not displayed.   Recent Labs    07/24/17 0931 08/21/17 0000 11/03/17 1818  12/05/17 12/24/17 0700 01/23/18 03/25/18  AST 17  17 16 20    < > 23 17  17 23 18   ALT 11  11 14  13*   < > 20 12  12 16 13   ALKPHOS 90  90  --  78   < > 97  --  217* 133*  BILITOT 0.4   0.4 0.3 0.4  --   --  0.4  0.4  --   --   PROT 7.0  7.0 7.0 6.5  --   --  7.2  7.2  --   --   ALBUMIN 3.6  3.6  --  3.1*  --   --   --   --   --    < > = values in this interval not displayed.   Recent Labs    11/15/17 0357  12/24/17 0700 01/16/18 0842 01/23/18 03/25/18  WBC 9.6   < > 6.5 10.3 7.0 7.3  HGB 9.7*   < > 10.7* 11.0* 10.9* 9.3*  HCT 29.9*   < > 31.9* 32.7* 32* 29*  MCV 88.7  --  86.4 88.4  --   --   PLT 270   < > 389 278 369 341   < > = values in this interval not displayed.   Lab Results  Component Value Date   TSH 2.00 11/20/2017   Lab Results  Component Value Date   HGBA1C 5.3 04/16/2017   Lab Results  Component Value Date   CHOL 188 04/12/2016   HDL 62 04/12/2016   LDLCALC 102 04/12/2016   TRIG 122 04/12/2016   CHOLHDL 3.0 Ratio 06/07/2008    Significant Diagnostic Results in last 30 days:  No results found.  Assessment/Plan Hypokalemia 03/25/18 Na 134, K 3.2, Bun 11, creat 0.69, adding Kcl 14mq qd, update BMP in one week.   Blood loss anemia 03/25/18 wbc 7.3, Hgb 9.3, no apparent bleeding, continue folic acid, Vit BL46 Fe, update CBC in one month.   Hyponatremia Hx of hyponatremia, Na 134 03/25/18, 138 01/23/18. Continue to observe.      Family/ staff Communication: plan of care reviewed with the patient and charge nurse.   Labs/tests ordered:  BMP one week, CBC one month.   Time spend 25 minutes.

## 2018-03-26 NOTE — Assessment & Plan Note (Signed)
03/25/18 Na 134, K 3.2, Bun 11, creat 0.69, adding Kcl 37mq qd, update BMP in one week.

## 2018-03-26 NOTE — Assessment & Plan Note (Signed)
03/25/18 wbc 7.3, Hgb 9.3, no apparent bleeding, continue folic acid, Vit Y04, Fe, update CBC in one month.

## 2018-03-26 NOTE — Assessment & Plan Note (Signed)
Hx of hyponatremia, Na 134 03/25/18, 138 01/23/18. Continue to observe.

## 2018-03-28 DIAGNOSIS — M6281 Muscle weakness (generalized): Secondary | ICD-10-CM | POA: Diagnosis not present

## 2018-03-29 DIAGNOSIS — R0602 Shortness of breath: Secondary | ICD-10-CM | POA: Diagnosis not present

## 2018-03-31 ENCOUNTER — Encounter: Payer: Self-pay | Admitting: Nurse Practitioner

## 2018-03-31 ENCOUNTER — Non-Acute Institutional Stay: Payer: Medicare HMO | Admitting: Nurse Practitioner

## 2018-03-31 DIAGNOSIS — J181 Lobar pneumonia, unspecified organism: Secondary | ICD-10-CM | POA: Diagnosis not present

## 2018-03-31 DIAGNOSIS — I471 Supraventricular tachycardia: Secondary | ICD-10-CM

## 2018-03-31 DIAGNOSIS — M6281 Muscle weakness (generalized): Secondary | ICD-10-CM | POA: Diagnosis not present

## 2018-03-31 DIAGNOSIS — J189 Pneumonia, unspecified organism: Secondary | ICD-10-CM

## 2018-03-31 NOTE — Progress Notes (Signed)
Location:  [facility] Room Number: 916 Place of Service:  ALF [phone]) Provider:  Janika Jedlicka, [place]  NP  Blanchie Serve, MD  Patient Care Team: Blanchie Serve, MD as PCP - General (Internal Medicine) Lanelle Bal., MD as Referring Physician (Internal Medicine) Satira Sark, MD as Consulting Physician (Cardiology) Kyung Rudd, MD as Consulting Physician (Radiation Oncology)  Extended Emergency Contact Information Primary Emergency Contact: Neita Garnet of [place] Phone: [phone] Relation: Son Secondary Emergency Contact: Minniear,Lance Address: [address]          [place], [place] [address] [name] of [place] Phone: [phone] Mobile Phone: [phone] Relation: Son  Code Status:  DNR Goals of care: Advanced Directive information Advanced Directives 03/26/2018  Does Patient Have a Medical Advance Directive? Yes  Type of Advance Directive Living will;Out of facility DNR (pink MOST or yellow form)  Does patient want to make changes to medical advance directive? No - Patient declined  Copy of [place] in Chart? -  Would patient like information on creating a medical advance directive? -  Pre-existing out of facility DNR order (yellow form or pink MOST form) Yellow form placed in chart (order not valid for inpatient use)     Chief Complaint  Patient presents with  . Acute Visit    Pneumonia    HPI:  Pt is a 84 y.o. female seen today for an acute visit for SOB, low grade temperature for 3 days, O2 sat 91% on O2 2lpm via [place], she denied cough, chest pain/pressure, trouble swallowing, palpitation, nausea, vomiting, constipation, or diarrhea. CXR 03/28/18 showed left basilar pneumonia, small left pleural effusion. DuoNeb q6h x 3 days started 4/12/59m then 10 day course of Doxycycline 149m bid started 03/29/18, tolerated so far. The patient stated she felt some kind of "blown up" than usual. She has  history of irregular heart beats, on Atenolol.    Past Medical History:  Diagnosis Date  . Allergic rhinitis   . Anemia    NOS  . Anxiety   . Biceps tendon rupture    Bilateral  . Breast [place]   Left s/p lumpectomy and XRT   . [place]chiectasis ([facility] 2014   Noted on chest x-ray  . Complication of anesthesia    slow to wake up  . Cough 2014  . Depression   . Diverticulosis   . Elevated liver enzymes    Biopsy consistent with steatohepatitis  . Fundic gland polyps of stomach, benign   . GERD (gastroesophageal reflux disease) 06/2007   EGD Dr RGala Romney>sm HH, multiple fundic gland polyps  . Glaucoma 2013   both eyes  . Hemorrhoids   . Hiatal hernia   . Hx of radiation therapy 07/17/10 to 07/21/10   SRS LLL lung  . Hyperlipidemia   . Insomnia   . Low back pain    scoliosis  . Lung cancer ([facility] 05/2010   Left 2011 - rad x 3  . Lymphedema    Left arm  . Osteoarthritis   . Osteopenia 10/25/2016  . Osteoporosis, senile   . Overactive bladder   . Pneumonia    RLL with sepsis  . PSVT (paroxysmal supraventricular tachycardia) ([facility]   . Rheumatic fever    Age 84 . Right thyroid nodule   . Steatohepatitis    liver bx  . Type 2 diabetes mellitus ([facility]    Past Surgical History:  Procedure Laterality Date  . ABDOMINAL  HYSTERECTOMY  1964  . APPENDECTOMY  1964  . BIOPSY THYROID  11/2008  . BREAST BIOPSY     X 3 w/ cystectomy  . BREAST LUMPECTOMY  2001  . CATARACT EXTRACTION Bilateral 2003   Lens implant Dr. Charise Killian  . CHOLECYSTECTOMY  1965  . COLONOSCOPY  09/11/2011   Procedure: COLONOSCOPY;  Surgeon: Dorothyann Peng, MD;  [facility];  Service: Endoscopy;  Laterality: N/A;  . COLONOSCOPY  2005 /2012   Dr. Lucianne Muss- diverticulosis, ext hemorrhoids.  . COLONOSCOPY WITH PROPOFOL N/A 11/09/2017   Procedure: COLONOSCOPY WITH PROPOFOL;  Surgeon: Mauri Pole, MD;  [facility];  Service: Endoscopy;  Laterality: N/A;  . COLONOSCOPY WITH PROPOFOL N/A  11/12/2017   Procedure: COLONOSCOPY WITH PROPOFOL;  Surgeon: Ladene Artist, MD;  [facility];  Service: Endoscopy;  Laterality: N/A;  . CYSTOSCOPY  2007  . ESOPHAGOGASTRODUODENOSCOPY  09/11/2011   Procedure: ESOPHAGOGASTRODUODENOSCOPY (EGD);  Surgeon: Dorothyann Peng, MD;  [facility];  Service: Endoscopy;  Laterality: N/A;  . ESOPHAGOGASTRODUODENOSCOPY (EGD) WITH PROPOFOL N/A 11/08/2017   Procedure: ESOPHAGOGASTRODUODENOSCOPY (EGD) WITH PROPOFOL;  Surgeon: Mauri Pole, MD;  [facility];  Service: Endoscopy;  Laterality: N/A;  . LIVER BIOPSY  2008  . LUNG BIOPSY Left 06/06/2010  . REVISION TOTAL HIP ARTHROPLASTY  2007   Left  . SKIN CANCER EXCISION     nose and left lower cheek  . TONSILLECTOMY  1930  . TOTAL HIP ARTHROPLASTY Bilateral 2005 & 2007    Dr. Earl Lagos. Aline Brochure     Allergies  Allergen Reactions  . Augmentin [Amoxicillin-Pot Clavulanate] Other (See Comments)    unknown  . Ibuprofen Hives  . Naproxen Other (See Comments)    unknown  . Statins Other (See Comments)    Feels like knives in stomach  . Surgilube [Gyne-Moistrin]     Rectal itching, burning  . Tape Rash    Outpatient Encounter Medications as of 03/31/2018  Medication Sig  . atenolol (TENORMIN) 25 MG tablet Take 25 mg by mouth daily.  . cetirizine (ZYRTEC) 10 MG tablet Take 10 mg by mouth daily.  . dorzolamide-timolol (COSOPT) 22.3-6.8 MG/ML ophthalmic solution Place 1 drop into both eyes 2 (two) times daily.   Marland Kitchen doxycycline (VIBRAMYCIN) 100 MG capsule Take 100 mg by mouth 2 (two) times daily.  . DULoxetine (CYMBALTA) 20 MG capsule TAKE (1) CAPSULE DAILY.  Marland Kitchen Ferrous Sulfate 27 MG TABS Take 1 tablet by mouth daily.  . folic acid (FOLVITE) 967 MCG tablet Take 400 mcg by mouth daily.  Marland Kitchen gabapentin (NEURONTIN) 100 MG capsule Take 1 capsule (100 mg total) by mouth 3 (three) times daily.  Marland Kitchen HYDROcodone-acetaminophen (NORCO/VICODIN) 5-325 MG tablet Take 1/2 tablet by  mouth twice daily.  Marland Kitchen lactose free nutrition (BOOST) LIQD Take 237 mLs by mouth 2 (two) times daily between meals.  . latanoprost (XALATAN) 0.005 % ophthalmic solution Place 1 drop into both eyes at bedtime.  . Multiple Vitamins-Minerals (PRESERVISION AREDS 2 PO) Take 1 capsule by mouth 2 (two) times daily.   Marland Kitchen omeprazole (PRILOSEC) 20 MG capsule TAKE 1 CAPSULE EVERY DAY.  Marland Kitchen polyethylene glycol (MIRALAX / GLYCOLAX) packet Take 17 g by mouth daily.  . vitamin B-12 (CYANOCOBALAMIN) 1000 MCG tablet Take 1,000 mcg by mouth daily.   No facility-administered encounter medications on file as of 03/31/2018.     Review of Systems  Constitutional: Positive for fatigue and fever. Negative for activity change, appetite change, chills and  diaphoresis.  HENT: Positive for facial swelling. Negative for congestion, trouble swallowing and voice change.   Respiratory: Positive for shortness of breath. Negative for apnea, cough, choking, chest tightness and wheezing.   Cardiovascular: Positive for leg swelling. Negative for chest pain and palpitations.  Gastrointestinal: Negative for abdominal distention, abdominal pain, constipation, diarrhea, nausea and vomiting.  Genitourinary: Negative for difficulty urinating, dysuria and urgency.  Musculoskeletal: Positive for gait problem.  Skin: Negative for color change and pallor.  Neurological: Negative for dizziness, speech difficulty, weakness and headaches.  Psychiatric/Behavioral: Negative for agitation, behavioral problems, confusion, hallucinations and sleep disturbance. The patient is not nervous/anxious.     Immunization History  Administered Date(s) Administered  . Influenza Whole 09/22/2008, 09/16/2012, 09/30/2013  . Influenza, High Dose Seasonal PF 09/25/2017  . Influenza-Unspecified 10/02/2014, 09/15/2015, 09/27/2016  . Pneumococcal Polysaccharide-23 08/17/2006  . Td 12/17/2005   Pertinent  Health Maintenance Due  Topic Date Due  . FOOT EXAM   07/19/1931  . OPHTHALMOLOGY EXAM  07/19/1931  . PNA vac Low Risk Adult (2 of 2 - PCV13) 08/18/2007  . URINE MICROALBUMIN  04/30/2008  . HEMOGLOBIN A1C  10/17/2017  . INFLUENZA VACCINE  07/17/2018  . DEXA SCAN  Completed   Fall Risk  09/19/2017 05/17/2016 04/20/2016 04/19/2016  Falls in the past year? No No No No   Functional Status Survey:    Vitals:   03/31/18 0955  BP: 140/80  Pulse: 72  Resp: 20  Temp: 99.1 F (37.3 C)  SpO2: 91%  Weight: 151 lb (68.5 kg)  Height: 5' 5"  (1.651 m)   Body mass index is 25.13 kg/m. Physical Exam  Constitutional: She appears well-developed and well-nourished. No distress.  HENT:  Head: Normocephalic and atraumatic.  Mouth/Throat: Oropharynx is clear and moist.  Eyes: Pupils are equal, round, and reactive to light. EOM are normal.  Neck: Normal range of motion. Neck supple. No JVD present. No thyromegaly present.  Cardiovascular: Normal rate.  No murmur heard. Irregular heart beats.   Pulmonary/Chest: She has no wheezes. She has rales. She exhibits no tenderness.  Crackles mid to lower lungs  Abdominal: Soft. She exhibits no distension. There is no tenderness.  Musculoskeletal: Normal range of motion. She exhibits edema.  Trace edema BLE  Skin: Skin is warm and dry. She is not diaphoretic.  Psychiatric: She has a normal mood and affect. Her behavior is normal.    Labs reviewed: Recent Labs    11/05/17 0346  12/24/17 0700 01/16/18 0842 01/23/18 03/25/18  NA 135   < > 135 136 138 134*  K 3.9   < > 3.9 3.5 3.8 3.2*  CL 109  --  97* 101  --   --   CO2 22  --  31 29  --   --   GLUCOSE 119*  --  79 99  --   --   BUN 34*   < > 15 18 19 11   CREATININE 0.84   < > 0.78 0.70 0.7 0.7  CALCIUM 8.2*  --  10.0 9.8  --   --    < > = values in this interval not displayed.   Recent Labs    07/24/17 0931 08/21/17 0000 11/03/17 1818  12/05/17 12/24/17 0700 01/23/18 03/25/18  AST 17  17 16 20    < > 23 17  17 23 18   ALT 11  11 14  13*   < > 20  12  12 16 13   ALKPHOS 90  90  --  78   < > 97  --  217* 133*  BILITOT 0.4  0.4 0.3 0.4  --   --  0.4  0.4  --   --   PROT 7.0  7.0 7.0 6.5  --   --  7.2  7.2  --   --   ALBUMIN 3.6  3.6  --  3.1*  --   --   --   --   --    < > = values in this interval not displayed.   Recent Labs    11/15/17 0357  12/24/17 0700 01/16/18 0842 01/23/18 03/25/18  WBC 9.6   < > 6.5 10.3 7.0 7.3  HGB 9.7*   < > 10.7* 11.0* 10.9* 9.3*  HCT 29.9*   < > 31.9* 32.7* 32* 29*  MCV 88.7  --  86.4 88.4  --   --   PLT 270   < > 389 278 369 341   < > = values in this interval not displayed.   Lab Results  Component Value Date   TSH 2.00 11/20/2017   Lab Results  Component Value Date   HGBA1C 5.3 04/16/2017   Lab Results  Component Value Date   CHOL 188 04/12/2016   HDL 62 04/12/2016   LDLCALC 102 04/12/2016   TRIG 122 04/12/2016   CHOLHDL 3.0 Ratio 06/07/2008    Significant Diagnostic Results in last 30 days:  No results found.  Assessment/Plan Left lower lobe pneumonia (HCC) SOB, low grade temperature for 3 days, O2 sat 91% on O2 2lpm via , she denied cough, chest pain/pressure, trouble swallowing, palpitation, nausea, vomiting, constipation, or diarrhea. CXR 03/28/18 showed left basilar pneumonia, small left pleural effusion. DuoNeb q6h x 3 days started 4/12/63m then 10 day course of Doxycycline 145m bid started 03/29/18, tolerated so far. The patient appears "puffy" than usual upon my visit today. Continue O2 2lpm to maintain Sat O2>90%, weigh daily ac breakfast x 7 days. Observe.   PSVT (paroxysmal supraventricular tachycardia) ([place] She has history of irregular heart beats, heart rate is in control, continue  Atenolol.       Family/ staff Communication: plan of care reviewed with the patient and charge nurse.   Labs/tests ordered:  CXR done 03/28/18  Time spend 25 minute.s

## 2018-03-31 NOTE — Assessment & Plan Note (Signed)
She has history of irregular heart beats, heart rate is in control, continue  Atenolol.

## 2018-03-31 NOTE — Assessment & Plan Note (Signed)
SOB, low grade temperature for 3 days, O2 sat 91% on O2 2lpm via [place], she denied cough, chest pain/pressure, trouble swallowing, palpitation, nausea, vomiting, constipation, or diarrhea. CXR 03/28/18 showed left basilar pneumonia, small left pleural effusion. DuoNeb q6h x 3 days started 4/12/57m then 10 day course of Doxycycline 128m bid started 03/29/18, tolerated so far. The patient appears "puffy" than usual upon my visit today. Continue O2 2lpm to maintain Sat O2>90%, weigh daily ac breakfast x 7 days. Observe.

## 2018-04-03 DIAGNOSIS — E876 Hypokalemia: Secondary | ICD-10-CM | POA: Diagnosis not present

## 2018-04-08 ENCOUNTER — Encounter: Payer: Self-pay | Admitting: Nurse Practitioner

## 2018-04-08 ENCOUNTER — Non-Acute Institutional Stay: Payer: Medicare HMO | Admitting: Nurse Practitioner

## 2018-04-08 DIAGNOSIS — J181 Lobar pneumonia, unspecified organism: Secondary | ICD-10-CM | POA: Diagnosis not present

## 2018-04-08 DIAGNOSIS — G8929 Other chronic pain: Secondary | ICD-10-CM | POA: Diagnosis not present

## 2018-04-08 DIAGNOSIS — E876 Hypokalemia: Secondary | ICD-10-CM | POA: Diagnosis not present

## 2018-04-08 DIAGNOSIS — M5440 Lumbago with sciatica, unspecified side: Secondary | ICD-10-CM

## 2018-04-08 DIAGNOSIS — J189 Pneumonia, unspecified organism: Secondary | ICD-10-CM

## 2018-04-08 DIAGNOSIS — M6281 Muscle weakness (generalized): Secondary | ICD-10-CM | POA: Diagnosis not present

## 2018-04-08 NOTE — Progress Notes (Signed)
Location:    [facility] Home Room Number: 629 Place of Service:  ALF (13) Provider: Lennie Odor Durinda Buzzelli NP  Blanchie Serve, MD  Patient Care Team: Blanchie Serve, MD as PCP - General (Internal Medicine) Lanelle Bal., MD as Referring Physician (Internal Medicine) Satira Sark, MD as Consulting Physician (Cardiology) Kyung Rudd, MD as Consulting Physician (Radiation Oncology)  Extended Emergency Contact Information Primary Emergency Contact: Neita Garnet of [place] Phone: [phone] Relation: Son Secondary Emergency Contact: Dubiel,Lance Address: [address]          [place], [place] [address] [name] of [place] Phone: [phone] Mobile Phone: [phone] Relation: Son  Code Status: DNR Goals of care: Advanced Directive information Advanced Directives 03/26/2018  Does Patient Have a Medical Advance Directive? Yes  Type of Advance Directive Living will;Out of facility DNR (pink MOST or yellow form)  Does patient want to make changes to medical advance directive? No - Patient declined  Copy of [place] in Chart? -  Would patient like information on creating a medical advance directive? -  Pre-existing out of facility DNR order (yellow form or pink MOST form) Yellow form placed in chart (order not valid for inpatient use)     Chief Complaint  Patient presents with  . Acute Visit    changes in pain meds    HPI:  Pt is a 84 y.o. female seen today for an acute visit for request to stop am Norco since she attributed it to her sleepiness during day, desires to schedule Tylenol every 6hr while awake, continue pm Norco, make am Norco available as needed for her lower back pain. Wants to trial of discontinue Kcl since her oral intake is better. She has completed 10 day course of Doxycycline 11m bid for PNA, she is asymptomatic of pneumonia.   Past Medical History:  Diagnosis Date  . Allergic rhinitis   . Anemia    NOS  .  Anxiety   . Biceps tendon rupture    Bilateral  . Breast cancer ([facility] 2001   Left s/p lumpectomy and XRT   . Bronchiectasis ([facility] 2014   Noted on chest x-ray  . Complication of anesthesia    slow to wake up  . Cough 2014  . Depression   . Diverticulosis   . Elevated liver enzymes    Biopsy consistent with steatohepatitis  . Fundic gland polyps of stomach, benign   . GERD (gastroesophageal reflux disease) 06/2007   EGD Dr RGala Romney>sm HH, multiple fundic gland polyps  . Glaucoma 2013   both eyes  . Hemorrhoids   . Hiatal hernia   . Hx of radiation therapy 07/17/10 to 07/21/10   SRS LLL lung  . Hyperlipidemia   . Insomnia   . Low back pain    scoliosis  . [place] cancer ([facility] 05/[place]   Left 2011 - rad x 3  . Lymphedema    Left arm  . Osteoarthritis   . Osteopenia 10/25/2016  . Osteoporosis, senile   . Overactive bladder   . Pneumonia    RLL with sepsis  . PSVT (paroxysmal supraventricular tachycardia) ([facility]   . Rheumatic fever    Age 84 . Right thyroid nodule   . Steatohepatitis    liver bx  . Type 2 diabetes mellitus ([facility]    Past Surgical History:  Procedure Laterality Date  . ABDOMINAL HYSTERECTOMY  1964  . APPENDECTOMY  1964  . BIOPSY THYROID  11/2008  .  BREAST BIOPSY     X 3 w/ cystectomy  . BREAST LUMPECTOMY  2001  . CATARACT EXTRACTION Bilateral 2003   Lens implant Dr. Charise Killian  . CHOLECYSTECTOMY  1965  . COLONOSCOPY  09/11/2011   Procedure: COLONOSCOPY;  Surgeon: Dorothyann Peng, MD;  [facility];  Service: Endoscopy;  Laterality: N/A;  . COLONOSCOPY  2005 /2012   Dr. Lucianne Muss- diverticulosis, ext hemorrhoids.  . COLONOSCOPY WITH PROPOFOL N/A 11/09/2017   Procedure: COLONOSCOPY WITH PROPOFOL;  Surgeon: Mauri Pole, MD;  [facility];  Service: Endoscopy;  Laterality: N/A;  . COLONOSCOPY WITH PROPOFOL N/A 11/12/2017   Procedure: COLONOSCOPY WITH PROPOFOL;  Surgeon: Ladene Artist, MD;  [facility];  Service: Endoscopy;   Laterality: N/A;  . CYSTOSCOPY  2007  . ESOPHAGOGASTRODUODENOSCOPY  09/11/2011   Procedure: ESOPHAGOGASTRODUODENOSCOPY (EGD);  Surgeon: Dorothyann Peng, MD;  [facility];  Service: Endoscopy;  Laterality: N/A;  . ESOPHAGOGASTRODUODENOSCOPY (EGD) WITH PROPOFOL N/A 11/08/2017   Procedure: ESOPHAGOGASTRODUODENOSCOPY (EGD) WITH PROPOFOL;  Surgeon: Mauri Pole, MD;  [facility];  Service: Endoscopy;  Laterality: N/A;  . LIVER BIOPSY  2008  . LUNG BIOPSY Left 06/06/2010  . REVISION TOTAL HIP ARTHROPLASTY  2007   Left  . SKIN CANCER EXCISION     nose and left lower cheek  . TONSILLECTOMY  1930  . TOTAL HIP ARTHROPLASTY Bilateral 2005 & 2007    Dr. Earl Lagos. Aline Brochure     Allergies  Allergen Reactions  . Augmentin [Amoxicillin-Pot Clavulanate] Other (See Comments)    unknown  . Ibuprofen Hives  . Naproxen Other (See Comments)    unknown  . Statins Other (See Comments)    Feels like knives in stomach  . Surgilube [Gyne-Moistrin]     Rectal itching, burning  . Tape Rash    Allergies as of 04/08/2018      Reactions   Augmentin [amoxicillin-pot Clavulanate] Other (See Comments)   unknown   Ibuprofen Hives   Naproxen Other (See Comments)   unknown   Statins Other (See Comments)   Feels like knives in stomach   Surgilube [gyne-moistrin]    Rectal itching, burning   Tape Rash      Medication List        Accurate as of 04/08/18 11:59 PM. Always use your most recent med list.          atenolol 25 MG tablet Commonly known as:  TENORMIN Take 25 mg by mouth daily.   cetirizine 10 MG tablet Commonly known as:  ZYRTEC Take 10 mg by mouth daily.   dorzolamide-timolol 22.3-6.8 MG/ML ophthalmic solution Commonly known as:  COSOPT Place 1 drop into both eyes 2 (two) times daily.   DULoxetine 20 MG capsule Commonly known as:  CYMBALTA TAKE (1) CAPSULE DAILY.   Ferrous Sulfate 27 MG Tabs Take 1 tablet by mouth daily.   folic acid 944 MCG  tablet Commonly known as:  FOLVITE Take 400 mcg by mouth daily.   gabapentin 100 MG capsule Commonly known as:  NEURONTIN Take 1 capsule (100 mg total) by mouth 3 (three) times daily.   HYDROcodone-acetaminophen 5-325 MG tablet Commonly known as:  NORCO/VICODIN Take 1/2 tablet by mouth twice daily.   lactose free nutrition Liqd Take 237 mLs by mouth 2 (two) times daily between meals.   latanoprost 0.005 % ophthalmic solution Commonly known as:  XALATAN Place 1 drop into both eyes at bedtime.   omeprazole 20 MG capsule Commonly  known as:  PRILOSEC TAKE 1 CAPSULE EVERY DAY.   polyethylene glycol packet Commonly known as:  MIRALAX / GLYCOLAX Take 17 g by mouth daily.   PRESERVISION AREDS 2 PO Take 1 capsule by mouth 2 (two) times daily.       Review of Systems  Constitutional: Negative for activity change, appetite change, chills, diaphoresis, fatigue and fever.  HENT: Positive for hearing loss. Negative for congestion, trouble swallowing and voice change.   Eyes: Negative for visual disturbance.  Respiratory: Negative for cough, shortness of breath and wheezing.   Cardiovascular: Positive for leg swelling. Negative for chest pain and palpitations.  Gastrointestinal: Negative for constipation, diarrhea, nausea and vomiting.  Genitourinary: Negative for difficulty urinating and dysuria.  Musculoskeletal: Positive for arthralgias, back pain and gait problem.  Skin: Negative for color change.  Neurological: Negative for dizziness, speech difficulty, weakness and headaches.  Psychiatric/Behavioral: Negative for agitation, behavioral problems, confusion, hallucinations and sleep disturbance. The patient is not nervous/anxious.     Immunization History  Administered Date(s) Administered  . Influenza Whole 09/22/2008, 09/16/2012, 09/30/2013  . Influenza, High Dose Seasonal PF 09/25/2017  . Influenza-Unspecified 10/02/2014, 09/15/2015, 09/27/2016  . Pneumococcal  Polysaccharide-23 08/17/2006  . Td 12/17/2005   Pertinent  Health Maintenance Due  Topic Date Due  . FOOT EXAM  07/19/1931  . OPHTHALMOLOGY EXAM  07/19/1931  . PNA vac Low Risk Adult (2 of 2 - PCV13) 08/18/2007  . URINE MICROALBUMIN  04/30/2008  . HEMOGLOBIN A1C  10/17/2017  . INFLUENZA VACCINE  07/17/2018  . DEXA SCAN  Completed   Fall Risk  09/19/2017 05/17/2016 04/20/2016 04/19/2016  Falls in the past year? No No No No   Functional Status Survey:    Vitals:   04/08/18 1141  BP: 140/80  Pulse: 76  Resp: 20  Temp: 97.6 F (36.4 C)  SpO2: 97%  Weight: 145 lb 8 oz (66 kg)  Height: 5' 5"  (1.651 m)   Body mass index is 24.21 kg/m. Physical Exam  Constitutional: She is oriented to person, place, and time. She appears well-developed and well-nourished.  HENT:  Head: Normocephalic and atraumatic.  Eyes: Pupils are equal, round, and reactive to light. EOM are normal.  Neck: Normal range of motion. Neck supple. No JVD present. No thyromegaly present.  Cardiovascular: Normal rate.  No murmur heard. Irregular heart beats  Pulmonary/Chest: She has no wheezes. She has rales.  Posterior left basilar rales.   Abdominal: Soft. Bowel sounds are normal. She exhibits no distension. There is no tenderness.  Musculoskeletal: She exhibits edema.  Trace edema BLE, ambulates with walker.   Neurological: She is alert and oriented to person, place, and time. No cranial nerve deficit. She exhibits normal muscle tone. Coordination normal.  Psychiatric: She has a normal mood and affect. Her behavior is normal. Judgment and thought content normal.    Labs reviewed: Recent Labs    11/05/17 0346  12/24/17 0700 01/16/18 0842 01/23/18 03/25/18  NA 135   < > 135 136 138 134*  K 3.9   < > 3.9 3.5 3.8 3.2*  CL 109  --  97* 101  --   --   CO2 22  --  31 29  --   --   GLUCOSE 119*  --  79 99  --   --   BUN 34*   < > 15 18 19 11   CREATININE 0.84   < > 0.78 0.70 0.7 0.7  CALCIUM 8.2*  --  10.0 9.8   --   --    < > =  values in this interval not displayed.   Recent Labs    07/24/17 0931 08/21/17 0000 11/03/17 1818  12/05/17 12/24/17 0700 01/23/18 03/25/18  AST 17  17 16 20    < > 23 17  17 23 18   ALT 11  11 14  13*   < > 20 12  12 16 13   ALKPHOS 90  90  --  78   < > 97  --  217* 133*  BILITOT 0.4  0.4 0.3 0.4  --   --  0.4  0.4  --   --   PROT 7.0  7.0 7.0 6.5  --   --  7.2  7.2  --   --   ALBUMIN 3.6  3.6  --  3.1*  --   --   --   --   --    < > = values in this interval not displayed.   Recent Labs    11/15/17 0357  12/24/17 0700 01/16/18 0842 01/23/18 03/25/18  WBC 9.6   < > 6.5 10.3 7.0 7.3  HGB 9.7*   < > 10.7* 11.0* 10.9* 9.3*  HCT 29.9*   < > 31.9* 32.7* 32* 29*  MCV 88.7  --  86.4 88.4  --   --   PLT 270   < > 389 278 369 341   < > = values in this interval not displayed.   Lab Results  Component Value Date   TSH 2.00 11/20/2017   Lab Results  Component Value Date   HGBA1C 5.3 04/16/2017   Lab Results  Component Value Date   CHOL 188 04/12/2016   HDL 62 04/12/2016   LDLCALC 102 04/12/2016   TRIG 122 04/12/2016   CHOLHDL 3.0 Ratio 06/07/2008    Significant Diagnostic Results in last 30 days:  No results found.  Assessment/Plan: Hypokalemia 03/25/18 K 3.2, 04/03/18 K 4.2, will  discontinue Kcl since her oral intake is better. Ff/u BMP in one week.    Left lower lobe pneumonia ([place]) She has completed 10 day course of Doxycycline 169m bid for PNA, she is asymptomatic of pneumonia. Will only use O2 2lpm for SatO2<89%, check Sat O2 q shift. Observe.    Pain in lower back Change am Norco to prn, schedule Tylenol every 6hr while awake, continue pm Norco, observe.     Family/ staff Communication: plan of care reviewed with the patient and charge nurse.   Labs/tests ordered:  BMP one week  Time spend 25 minutes.

## 2018-04-08 NOTE — Assessment & Plan Note (Signed)
Change am Norco to prn, schedule Tylenol every 6hr while awake, continue pm Norco, observe.

## 2018-04-08 NOTE — Assessment & Plan Note (Deleted)
Change am Norco to prn, schedule Tylenol every 6hr while awake, continue pm Norco, observe.

## 2018-04-08 NOTE — Assessment & Plan Note (Signed)
03/25/18 K 3.2, 04/03/18 K 4.2, will  discontinue Kcl since her oral intake is better. Ff/u BMP in one week.

## 2018-04-08 NOTE — Progress Notes (Signed)
Location:  [facility] Room Number: 916 Place of Service:  ALF [phone]) Provider:  Mast, Manxie  NP  Blanchie Serve, MD  Patient Care Team: Blanchie Serve, MD as PCP - General (Internal Medicine) Lanelle Bal., MD as Referring Physician (Internal Medicine) Satira Sark, MD as Consulting Physician (Cardiology) Kyung Rudd, MD as Consulting Physician (Radiation Oncology)  Extended Emergency Contact Information Primary Emergency Contact: Neita Garnet of [place] Phone: [phone] Relation: Son Secondary Emergency Contact: [place]que,Lance Address: [address]          [place], [place] [address] [name] of [place] Phone: [phone] Mobile Phone: [phone] Relation: Son  Code Status: DNR Goals of care: Advanced Directive information Advanced Directives 03/26/2018  Does Patient Have a Medical Advance Directive? Yes  Type of Advance Directive Living will;Out of facility DNR (pink MOST or yellow form)  Does patient want to make changes to medical advance directive? No - Patient declined  Copy of [place] in Chart? -  Would patient like information on creating a medical advance directive? -  Pre-existing out of facility DNR order (yellow form or pink MOST form) Yellow form placed in chart (order not valid for inpatient use)     Chief Complaint  Patient presents with  . Acute Visit    changes in pain meds    HPI:  Pt is a 84 y.o. female seen today for an acute visit for    Past Medical History:  Diagnosis Date  . Allergic rhinitis   . Anemia    NOS  . Anxiety   . Biceps tendon rupture    Bilateral  . Breast cancer ([place]) 2001   Left s/p lumpectomy and XRT   . Bronchiectasis ([place]) 2014   Noted on chest x-ray  . Complication of anesthesia    slow to wake up  . Cough 2014  . Depression   . Diverticulosis   . Elevated liver enzymes    Biopsy consistent with steatohepatitis  . Fundic  gland polyps of stomach, benign   . GERD (gastroesophageal reflux disease) 06/2007   EGD Dr Gala Romney >sm HH, multiple fundic gland polyps  . Glaucoma 2013   both eyes  . Hemorrhoids   . Hiatal hernia   . Hx of radiation therapy 07/17/10 to 07/21/10   SRS LLL lung  . Hyperlipidemia   . Insomnia   . Low back pain    scoliosis  . Lung cancer ([place]) 05/2010   Left 2011 - rad x 3  . Lymphedema    Left arm  . Osteoarthritis   . Osteopenia 10/25/2016  . Osteoporosis, senile   . Overactive bladder   . Pneumonia    RLL with sepsis  . PSVT (paroxysmal supraventricular tachycardia) ([place])   . Rheumatic fever    Age 42  . Right thyroid nodule   . Steatohepatitis    liver bx  . Type 2 diabetes mellitus ([place])    Past Surgical History:  Procedure Laterality Date  . ABDOMINAL HYSTERECTOMY  1964  . APPENDECTOMY  1964  . BIOPSY THYROID  11/2008  . BREAST BIOPSY     X 3 w/ cystectomy  . BREAST LUMPECTOMY  2001  . CATARACT EXTRACTION Bilateral 2003   Lens implant Dr. Charise Killian  . CHOLECYSTECTOMY  1965  . COLONOSCOPY  09/11/2011   Procedure: COLONOSCOPY;  Surgeon: Dorothyann Peng, MD;  [facility];  Service: Endoscopy;  Laterality: N/A;  .  COLONOSCOPY  2005 /2012   Dr. Lucianne Muss- diverticulosis, ext hemorrhoids.  . COLONOSCOPY WITH PROPOFOL N/A 11/09/2017   Procedure: COLONOSCOPY WITH PROPOFOL;  Surgeon: Mauri Pole, MD;  [facility];  Service: Endoscopy;  Laterality: N/A;  . COLONOSCOPY WITH PROPOFOL N/A 11/12/2017   Procedure: COLONOSCOPY WITH PROPOFOL;  Surgeon: Ladene Artist, MD;  [facility];  Service: Endoscopy;  Laterality: N/A;  . CYSTOSCOPY  2007  . ESOPHAGOGASTRODUODENOSCOPY  09/11/2011   Procedure: ESOPHAGOGASTRODUODENOSCOPY (EGD);  Surgeon: Dorothyann Peng, MD;  [facility];  Service: Endoscopy;  Laterality: N/A;  . ESOPHAGOGASTRODUODENOSCOPY (EGD) WITH PROPOFOL N/A 11/08/2017   Procedure: ESOPHAGOGASTRODUODENOSCOPY (EGD) WITH PROPOFOL;   Surgeon: Mauri Pole, MD;  [facility];  Service: Endoscopy;  Laterality: N/A;  . LIVER BIOPSY  2008  . LUNG BIOPSY Left 06/06/2010  . REVISION TOTAL HIP ARTHROPLASTY  2007   Left  . SKIN CANCER EXCISION     nose and left lower cheek  . TONSILLECTOMY  1930  . TOTAL HIP ARTHROPLASTY Bilateral 2005 & 2007    Dr. Earl Lagos. Aline Brochure     Allergies  Allergen Reactions  . Augmentin [Amoxicillin-Pot Clavulanate] Other (See Comments)    unknown  . Ibuprofen Hives  . Naproxen Other (See Comments)    unknown  . Statins Other (See Comments)    Feels like knives in stomach  . Surgilube [Gyne-Moistrin]     Rectal itching, burning  . Tape Rash    Outpatient Encounter Medications as of 04/08/2018  Medication Sig  . atenolol (TENORMIN) 25 MG tablet Take 25 mg by mouth daily.  . cetirizine (ZYRTEC) 10 MG tablet Take 10 mg by mouth daily.  . dorzolamide-timolol (COSOPT) 22.3-6.8 MG/ML ophthalmic solution Place 1 drop into both eyes 2 (two) times daily.   . DULoxetine (CYMBALTA) 20 MG capsule TAKE (1) CAPSULE DAILY.  [place] Kitchen Ferrous Sulfate 27 MG TABS Take 1 tablet by mouth daily.  . folic acid (FOLVITE) 465 MCG tablet Take 400 mcg by mouth daily.  [place] Kitchen gabapentin (NEURONTIN) 100 MG capsule Take 1 capsule (100 mg total) by mouth 3 (three) times daily.  [place] Kitchen HYDROcodone-acetaminophen (NORCO/VICODIN) 5-325 MG tablet Take 1/2 tablet by mouth twice daily.  [place] Kitchen lactose free nutrition (BOOST) LIQD Take 237 mLs by mouth 2 (two) times daily between meals.  . latanoprost (XALATAN) 0.005 % ophthalmic solution Place 1 drop into both eyes at bedtime.  . Multiple Vitamins-Minerals (PRESERVISION AREDS 2 PO) Take 1 capsule by mouth 2 (two) times daily.   [place] Kitchen omeprazole (PRILOSEC) 20 MG capsule TAKE 1 CAPSULE EVERY DAY.  [place] Kitchen polyethylene glycol (MIRALAX / GLYCOLAX) packet Take 17 g by mouth daily.  . [DISCONTINUED] doxycycline (VIBRAMYCIN) 100 MG capsule Take 100 mg by mouth 2 (two) times daily.  .  [DISCONTINUED] vitamin B-12 (CYANOCOBALAMIN) 1000 MCG tablet Take 1,000 mcg by mouth daily.   No facility-administered encounter medications on file as of 04/08/2018.     Review of Systems  Immunization History  Administered Date(s) Administered  . Influenza Whole 09/22/2008, 09/16/2012, 09/30/2013  . Influenza, High Dose Seasonal PF 09/25/2017  . Influenza-Unspecified 10/02/2014, 09/15/2015, 09/27/2016  . Pneumococcal Polysaccharide-23 08/17/2006  . Td 12/17/2005   Pertinent  Health Maintenance Due  Topic Date Due  . FOOT EXAM  07/19/1931  . OPHTHALMOLOGY EXAM  07/19/1931  . PNA vac Low Risk Adult (2 of 2 - PCV13) 08/18/2007  . URINE MICROALBUMIN  04/30/2008  . HEMOGLOBIN A1C  10/17/2017  . INFLUENZA VACCINE  07/17/2018  . DEXA SCAN  Completed   Fall Risk  09/19/2017 05/17/2016 04/20/2016 04/19/2016  Falls in the past year? No No No No   Functional Status Survey:    Vitals:   04/08/18 1141  BP: 140/80  Pulse: 76  Resp: 20  Temp: 97.6 F (36.4 C)  SpO2: 97%  Weight: 145 lb 8 oz (66 kg)  Height: 5' 5"  (1.651 m)   Body mass index is 24.21 kg/m. Physical Exam  Labs reviewed: Recent Labs    11/05/17 0346  12/24/17 0700 01/16/18 0842 01/23/18 03/25/18  NA 135   < > 135 136 138 134*  K 3.9   < > 3.9 3.5 3.8 3.2*  CL 109  --  97* 101  --   --   CO2 22  --  31 29  --   --   GLUCOSE 119*  --  79 99  --   --   BUN 34*   < > 15 18 19 11   CREATININE 0.84   < > 0.78 0.70 0.7 0.7  CALCIUM 8.2*  --  10.0 9.8  --   --    < > = values in this interval not displayed.   Recent Labs    07/24/17 0931 08/21/17 0000 11/03/17 1818  12/05/17 12/24/17 0700 01/23/18 03/25/18  AST 17  17 16 20    < > 23 17  17 23 18   ALT 11  11 14  13*   < > 20 12  12 16 13   ALKPHOS 90  90  --  78   < > 97  --  217* 133*  BILITOT 0.4  0.4 0.3 0.4  --   --  0.4  0.4  --   --   PROT 7.0  7.0 7.0 6.5  --   --  7.2  7.2  --   --   ALBUMIN 3.6  3.6  --  3.1*  --   --   --   --   --    < > =  values in this interval not displayed.   Recent Labs    11/15/17 0357  12/24/17 0700 01/16/18 0842 01/23/18 03/25/18  WBC 9.6   < > 6.5 10.3 7.0 7.3  HGB 9.7*   < > 10.7* 11.0* 10.9* 9.3*  HCT 29.9*   < > 31.9* 32.7* 32* 29*  MCV 88.7  --  86.4 88.4  --   --   PLT 270   < > 389 278 369 341   < > = values in this interval not displayed.   Lab Results  Component Value Date   TSH 2.00 11/20/2017   Lab Results  Component Value Date   HGBA1C 5.3 04/16/2017   Lab Results  Component Value Date   CHOL 188 04/12/2016   HDL 62 04/12/2016   LDLCALC 102 04/12/2016   TRIG 122 04/12/2016   CHOLHDL 3.0 Ratio 06/07/2008    Significant Diagnostic Results in last 30 days:  No results found.  Assessment/Plan There are no diagnoses linked to this encounter.   Family/ staff Communication:  Labs/tests ordered:

## 2018-04-08 NOTE — Assessment & Plan Note (Signed)
She has completed 10 day course of Doxycycline 179m bid for PNA, she is asymptomatic of pneumonia. Will only use O2 2lpm for SatO2<89%, check Sat O2 q shift. Observe.

## 2018-04-10 DIAGNOSIS — M6281 Muscle weakness (generalized): Secondary | ICD-10-CM | POA: Diagnosis not present

## 2018-04-15 DIAGNOSIS — I10 Essential (primary) hypertension: Secondary | ICD-10-CM | POA: Diagnosis not present

## 2018-04-15 DIAGNOSIS — M6281 Muscle weakness (generalized): Secondary | ICD-10-CM | POA: Diagnosis not present

## 2018-04-15 LAB — BASIC METABOLIC PANEL
BUN: 16 (ref 4–21)
CREATININE: 0.8 (ref <=1.1)
GLUCOSE: 76
POTASSIUM: 4.1 (ref 3.4–5.3)
Sodium: 134 — AB (ref 137–147)

## 2018-04-16 ENCOUNTER — Other Ambulatory Visit: Payer: Self-pay | Admitting: *Deleted

## 2018-04-16 LAB — BASIC METABOLIC PANEL
CO2: 28
Calcium: 9.2
Chloride: 99

## 2018-04-17 DIAGNOSIS — R2681 Unsteadiness on feet: Secondary | ICD-10-CM | POA: Diagnosis not present

## 2018-04-17 DIAGNOSIS — M6281 Muscle weakness (generalized): Secondary | ICD-10-CM | POA: Diagnosis not present

## 2018-04-22 DIAGNOSIS — H524 Presbyopia: Secondary | ICD-10-CM | POA: Diagnosis not present

## 2018-04-22 DIAGNOSIS — H401132 Primary open-angle glaucoma, bilateral, moderate stage: Secondary | ICD-10-CM | POA: Diagnosis not present

## 2018-04-22 DIAGNOSIS — H353230 Exudative age-related macular degeneration, bilateral, stage unspecified: Secondary | ICD-10-CM | POA: Diagnosis not present

## 2018-04-23 DIAGNOSIS — R2681 Unsteadiness on feet: Secondary | ICD-10-CM | POA: Diagnosis not present

## 2018-04-23 DIAGNOSIS — Z85828 Personal history of other malignant neoplasm of skin: Secondary | ICD-10-CM | POA: Diagnosis not present

## 2018-04-23 DIAGNOSIS — L821 Other seborrheic keratosis: Secondary | ICD-10-CM | POA: Diagnosis not present

## 2018-04-23 DIAGNOSIS — M6281 Muscle weakness (generalized): Secondary | ICD-10-CM | POA: Diagnosis not present

## 2018-04-23 DIAGNOSIS — D1801 Hemangioma of skin and subcutaneous tissue: Secondary | ICD-10-CM | POA: Diagnosis not present

## 2018-04-24 DIAGNOSIS — R2681 Unsteadiness on feet: Secondary | ICD-10-CM | POA: Diagnosis not present

## 2018-04-24 DIAGNOSIS — I482 Chronic atrial fibrillation: Secondary | ICD-10-CM | POA: Diagnosis not present

## 2018-04-24 DIAGNOSIS — M810 Age-related osteoporosis without current pathological fracture: Secondary | ICD-10-CM | POA: Diagnosis not present

## 2018-04-24 DIAGNOSIS — Z79899 Other long term (current) drug therapy: Secondary | ICD-10-CM | POA: Diagnosis not present

## 2018-04-24 DIAGNOSIS — I10 Essential (primary) hypertension: Secondary | ICD-10-CM | POA: Diagnosis not present

## 2018-04-24 DIAGNOSIS — D509 Iron deficiency anemia, unspecified: Secondary | ICD-10-CM | POA: Diagnosis not present

## 2018-04-24 DIAGNOSIS — M6281 Muscle weakness (generalized): Secondary | ICD-10-CM | POA: Diagnosis not present

## 2018-04-24 LAB — CBC AND DIFFERENTIAL
HEMATOCRIT: 29 — AB (ref 36–46)
HEMOGLOBIN: 9.6 — AB (ref 12.0–16.0)
Platelets: 346 (ref 150–399)
WBC: 7.8

## 2018-04-25 ENCOUNTER — Other Ambulatory Visit: Payer: Self-pay | Admitting: *Deleted

## 2018-04-28 DIAGNOSIS — M6281 Muscle weakness (generalized): Secondary | ICD-10-CM | POA: Diagnosis not present

## 2018-04-28 DIAGNOSIS — R2681 Unsteadiness on feet: Secondary | ICD-10-CM | POA: Diagnosis not present

## 2018-04-30 DIAGNOSIS — R2681 Unsteadiness on feet: Secondary | ICD-10-CM | POA: Diagnosis not present

## 2018-04-30 DIAGNOSIS — M6281 Muscle weakness (generalized): Secondary | ICD-10-CM | POA: Diagnosis not present

## 2018-05-06 ENCOUNTER — Encounter: Payer: Self-pay | Admitting: *Deleted

## 2018-05-06 DIAGNOSIS — R2681 Unsteadiness on feet: Secondary | ICD-10-CM | POA: Diagnosis not present

## 2018-05-06 DIAGNOSIS — M6281 Muscle weakness (generalized): Secondary | ICD-10-CM | POA: Diagnosis not present

## 2018-05-08 DIAGNOSIS — R2681 Unsteadiness on feet: Secondary | ICD-10-CM | POA: Diagnosis not present

## 2018-05-08 DIAGNOSIS — M6281 Muscle weakness (generalized): Secondary | ICD-10-CM | POA: Diagnosis not present

## 2018-05-13 DIAGNOSIS — R2681 Unsteadiness on feet: Secondary | ICD-10-CM | POA: Diagnosis not present

## 2018-05-13 DIAGNOSIS — M6281 Muscle weakness (generalized): Secondary | ICD-10-CM | POA: Diagnosis not present

## 2018-05-15 DIAGNOSIS — R2681 Unsteadiness on feet: Secondary | ICD-10-CM | POA: Diagnosis not present

## 2018-05-15 DIAGNOSIS — M6281 Muscle weakness (generalized): Secondary | ICD-10-CM | POA: Diagnosis not present

## 2018-06-02 ENCOUNTER — Non-Acute Institutional Stay: Payer: Medicare HMO | Admitting: Nurse Practitioner

## 2018-06-02 ENCOUNTER — Encounter: Payer: Self-pay | Admitting: Nurse Practitioner

## 2018-06-02 DIAGNOSIS — R2681 Unsteadiness on feet: Secondary | ICD-10-CM | POA: Diagnosis not present

## 2018-06-02 DIAGNOSIS — S90121A Contusion of right lesser toe(s) without damage to nail, initial encounter: Secondary | ICD-10-CM

## 2018-06-02 DIAGNOSIS — R609 Edema, unspecified: Secondary | ICD-10-CM | POA: Diagnosis not present

## 2018-06-02 DIAGNOSIS — S9031XA Contusion of right foot, initial encounter: Secondary | ICD-10-CM | POA: Diagnosis not present

## 2018-06-02 NOTE — Assessment & Plan Note (Addendum)
Encourage safety awareness, risk for falling and injury, close supervision for safety. Continue electric w/c for mobility.

## 2018-06-02 NOTE — Assessment & Plan Note (Addendum)
The right 2nd, 3rd, 4th toes, lateral right fore and mid foot,  will apply BioFreeze cream qid x 10 days, prn Tylenol is available to her, X-ray right foot Ap, lateral, oblique views to r/o fx, apply cold compress 56mn/each, 4x day x 5 days.

## 2018-06-02 NOTE — Progress Notes (Signed)
Location:  [facility] Room Number: 916 Place of Service:  ALF [phone]) Provider:  Raziya Aveni, ManXie NP  Blanchie Serve, MD  Patient Care Team: Blanchie Serve, MD as PCP - General (Internal Medicine) Lanelle Bal., MD as Referring Physician (Internal Medicine) Satira Sark, MD as Consulting Physician (Cardiology) Kyung Rudd, MD as Consulting Physician (Radiation Oncology)  Extended Emergency Contact Information Primary Emergency Contact: Neita Garnet of [place] Phone: [phone] Relation: Son Secondary Emergency Contact: Saxby,Lance Address: [address]          [place], [place] [address] [name] of [place] Phone: [phone] Mobile Phone: [phone] Relation: Son  Code Status:  DNR Goals of care: Advanced Directive information Advanced Directives 06/02/2018  Does Patient Have a Medical Advance Directive? Yes  Type of Advance Directive Living will;Out of facility DNR (pink MOST or yellow form)  Does patient want to make changes to medical advance directive? No - Patient declined  Copy of [address] in Chart? -  Would patient like information on creating a medical advance directive? -  Pre-existing out of facility DNR order (yellow form or pink MOST form) Pink MOST form placed in chart (order not valid for inpatient use)     Chief Complaint  Patient presents with  . Acute Visit    pain and swelling (R) foot and toes (2nd,3rd, 4th)    HPI:  Pt is a 84 y.o. female seen today for an acute visit for right toes and foot pain, swelling, and warmth sustained from caught closing dinning room door 06/01/18. ROM of toes with slight pain today.   The patient has unsteady gait, self transfer, electric w/c for mobility, risk for fall and injury.   Past Medical History:  Diagnosis Date  . Allergic rhinitis   . Anemia    NOS  . Anxiety   . Biceps tendon rupture    Bilateral  . Breast cancer ([facility]) 2001     Left s/p lumpectomy and XRT   . Bronchiectasis ([facility]) 2014   Noted on chest x-ray  . Complication of anesthesia    slow to wake up  . Cough 2014  . Depression   . Diverticulosis   . Elevated liver enzymes    Biopsy consistent with steatohepatitis  . Fundic gland polyps of stomach, benign   . GERD (gastroesophageal reflux disease) 06/2007   EGD Dr Gala Romney >sm HH, multiple fundic gland polyps  . Glaucoma 2013   both eyes  . Hemorrhoids   . Hiatal hernia   . Hx of radiation therapy 07/17/10 to 07/21/10   SRS LLL lung  . Hyperlipidemia   . Insomnia   . Low back pain    scoliosis  . Lung cancer ([facility]) 05/2010   Left 2011 - rad x 3  . Lymphedema    Left arm  . Osteoarthritis   . Osteopenia 10/25/2016  . Osteoporosis, senile   . Overactive bladder   . Pneumonia    RLL with sepsis  . PSVT (paroxysmal supraventricular tachycardia) ([facility])   . Rheumatic fever    Age 82  . Right thyroid nodule   . Steatohepatitis    liver bx  . Type 2 diabetes mellitus ([facility])    Past Surgical History:  Procedure Laterality Date  . ABDOMINAL HYSTERECTOMY  1964  . APPENDECTOMY  1964  . BIOPSY THYROID  11/2008  . BREAST BIOPSY     X 3 w/ cystectomy  . BREAST LUMPECTOMY  2001  . CATARACT EXTRACTION Bilateral 2003   Lens implant Dr. Charise Killian  . CHOLECYSTECTOMY  1965  . COLONOSCOPY  09/11/2011   Procedure: COLONOSCOPY;  Surgeon: Dorothyann Peng, MD;  [facility];  Service: Endoscopy;  Laterality: N/A;  . COLONOSCOPY  2005 /2012   Dr. Lucianne Muss- diverticulosis, ext hemorrhoids.  . COLONOSCOPY WITH PROPOFOL N/A 11/09/2017   Procedure: COLONOSCOPY WITH PROPOFOL;  Surgeon: Mauri Pole, MD;  [facility];  Service: Endoscopy;  Laterality: N/A;  . COLONOSCOPY WITH PROPOFOL N/A 11/12/2017   Procedure: COLONOSCOPY WITH PROPOFOL;  Surgeon: Ladene Artist, MD;  [facility];  Service: Endoscopy;  Laterality: N/A;  . CYSTOSCOPY  2007  . ESOPHAGOGASTRODUODENOSCOPY  09/11/2011    Procedure: ESOPHAGOGASTRODUODENOSCOPY (EGD);  Surgeon: Dorothyann Peng, MD;  [facility];  Service: Endoscopy;  Laterality: N/A;  . ESOPHAGOGASTRODUODENOSCOPY (EGD) WITH PROPOFOL N/A 11/08/2017   Procedure: ESOPHAGOGASTRODUODENOSCOPY (EGD) WITH PROPOFOL;  Surgeon: Mauri Pole, MD;  [facility];  Service: Endoscopy;  Laterality: N/A;  . LIVER BIOPSY  2008  . LUNG BIOPSY Left 06/06/2010  . REVISION TOTAL HIP ARTHROPLASTY  2007   Left  . SKIN CANCER EXCISION     nose and left lower cheek  . TONSILLECTOMY  1930  . TOTAL HIP ARTHROPLASTY Bilateral 2005 & 2007    Dr. Earl Lagos. Aline Brochure     Allergies  Allergen Reactions  . Augmentin [Amoxicillin-Pot Clavulanate] Other (See Comments)    unknown  . Ibuprofen Hives  . Naproxen Other (See Comments)    unknown  . Statins Other (See Comments)    Feels like knives in stomach  . Surgilube [Gyne-Moistrin]     Rectal itching, burning  . Tape Rash    Outpatient Encounter Medications as of 06/02/2018  Medication Sig  . acetaminophen (TYLENOL) 325 MG tablet Take 650 mg by mouth every 6 (six) hours as needed.  Marland Kitchen atenolol (TENORMIN) 25 MG tablet Take 25 mg by mouth daily.  . B Complex Vitamins (B COMPLEX-B12) TABS Take 1 tablet by mouth daily.  . cetirizine (ZYRTEC) 10 MG tablet Take 10 mg by mouth daily.  . DULoxetine (CYMBALTA) 20 MG capsule TAKE (1) CAPSULE DAILY.  Marland Kitchen Ferrous Sulfate 27 MG TABS Take 1 tablet by mouth daily.  . folic acid (FOLVITE) 756 MCG tablet Take 400 mcg by mouth daily.  Marland Kitchen latanoprost (XALATAN) 0.005 % ophthalmic solution Place 1 drop into both eyes at bedtime.  . lidocaine (LIDODERM) 5 % Place 1 patch onto the skin daily. Remove & Discard patch within 12 hours or as directed by MD  . Multiple Vitamins-Minerals (PRESERVISION AREDS 2 PO) Take 1 capsule by mouth 2 (two) times daily.   Marland Kitchen omeprazole (PRILOSEC) 20 MG capsule TAKE 1 CAPSULE EVERY DAY.  Marland Kitchen polyethylene glycol (MIRALAX / GLYCOLAX)  packet Take 17 g by mouth daily.  . Simethicone 80 MG TABS Take 1 tablet by mouth 4 (four) times daily as needed.  . [DISCONTINUED] HYDROcodone-acetaminophen (NORCO/VICODIN) 5-325 MG tablet Take 1/2 tablet by mouth twice daily.  . [DISCONTINUED] lactose free nutrition (BOOST) LIQD Take 237 mLs by mouth 2 (two) times daily between meals.   No facility-administered encounter medications on file as of 06/02/2018.     Review of Systems  Constitutional: Negative for activity change, appetite change, chills, diaphoresis, fatigue and fever.  Respiratory: Positive for shortness of breath.   Cardiovascular: Positive for leg swelling. Negative for chest pain and palpitations.  Gastrointestinal:  Occasional bowel incontinence when passing gas.   Musculoskeletal: Positive for gait problem. Negative for joint swelling.       Right 2nd 3rd 4th toes pain  Skin: Positive for color change. Negative for pallor, rash and wound.       Mild redness right 2nd, 3rd, 4th toes, and lateral right fore/mid foot.   Neurological: Negative for dizziness, speech difficulty, weakness and headaches.       Memory lapses. Repetitive.   Psychiatric/Behavioral: Negative for agitation, behavioral problems, hallucinations and sleep disturbance. The patient is not nervous/anxious.     Immunization History  Administered Date(s) Administered  . Influenza Whole 09/22/2008, 09/16/2012, 09/30/2013  . Influenza, High Dose Seasonal PF 09/25/2017  . Influenza-Unspecified 10/02/2014, 09/15/2015, 09/27/2016  . Pneumococcal Polysaccharide-23 08/17/2006  . Td 12/17/2005   Pertinent  Health Maintenance Due  Topic Date Due  . FOOT EXAM  07/19/1931  . OPHTHALMOLOGY EXAM  07/19/1931  . PNA vac Low Risk Adult (2 of 2 - PCV13) 08/18/2007  . URINE MICROALBUMIN  04/30/2008  . HEMOGLOBIN A1C  10/17/2017  . INFLUENZA VACCINE  07/17/2018  . DEXA SCAN  Completed   Fall Risk  09/19/2017 05/17/2016 04/20/2016 04/19/2016  Falls in the past  year? No No No No   Functional Status Survey:    Vitals:   06/02/18 1215  BP: (!) 142/84  Pulse: 78  Resp: 18  Temp: 98.5 F (36.9 C)  SpO2: 93%  Weight: 148 lb 3.2 oz (67.2 kg)  Height: 5' 5"  (1.651 m)   Body mass index is 24.66 kg/m. Physical Exam  Constitutional: She appears well-developed and well-nourished. No distress.  HENT:  Head: Normocephalic and atraumatic.  Eyes: Pupils are equal, round, and reactive to light. EOM are normal.  Musculoskeletal: She exhibits edema and tenderness.  The right 2nd, 3rd, 4th toes, lateral right fore/mid foot pain on palpation and ROM, mild swelling and warmth. BLE edema. Self transfer, electric w/c for mobility.   Neurological: She is alert. No cranial nerve deficit. She exhibits normal muscle tone. Coordination normal.  Oriented to person and place.   Skin: Skin is warm and dry. She is not diaphoretic. There is erythema.  Mild redness the right 2nd, 3rd, 4th toes, lateral right fore/mid foot.   Psychiatric: She has a normal mood and affect. Her behavior is normal.    Labs reviewed: Recent Labs    11/05/17 0346  12/24/17 0700 01/16/18 0842 01/23/18 03/25/18 04/15/18  NA 135   < > 135 136 138 134* 134*  K 3.9   < > 3.9 3.5 3.8 3.2* 4.1  CL 109  --  97* 101  --   --  99  CO2 22  --  31 29  --   --  28  GLUCOSE 119*  --  79 99  --   --   --   BUN 34*   < > 15 18 19 11 16   CREATININE 0.84   < > 0.78 0.70 0.7 0.7 0.8  CALCIUM 8.2*  --  10.0 9.8  --   --  9.2   < > = values in this interval not displayed.   Recent Labs    07/24/17 0931 08/21/17 0000 11/03/17 1818  12/05/17 12/24/17 0700 01/23/18 03/25/18  AST 17  17 16 20    < > 23 17  17 23 18   ALT 11  11 14  13*   < > 20 12  12 16 13   ALKPHOS 90  90  --  78   < > 97  --  217* 133*  BILITOT 0.4  0.4 0.3 0.4  --   --  0.4  0.4  --   --   PROT 7.0  7.0 7.0 6.5  --   --  7.2  7.2  --   --   ALBUMIN 3.6  3.6  --  3.1*  --   --   --   --   --    < > = values in this  interval not displayed.   Recent Labs    11/15/17 0357  12/24/17 0700 01/16/18 0842 01/23/18 03/25/18 04/24/18  WBC 9.6   < > 6.5 10.3 7.0 7.3 7.8  HGB 9.7*   < > 10.7* 11.0* 10.9* 9.3* 9.6*  HCT 29.9*   < > 31.9* 32.7* 32* 29* 29*  MCV 88.7  --  86.4 88.4  --   --   --   PLT 270   < > 389 278 369 341 346   < > = values in this interval not displayed.   Lab Results  Component Value Date   TSH 2.00 11/20/2017   Lab Results  Component Value Date   HGBA1C 5.3 04/16/2017   Lab Results  Component Value Date   CHOL 188 04/12/2016   HDL 62 04/12/2016   LDLCALC 102 04/12/2016   TRIG 122 04/12/2016   CHOLHDL 3.0 Ratio 06/07/2008    Significant Diagnostic Results in last 30 days:  No results found.  Assessment/Plan Contusion of foot including toes, right, initial encounter The right 2nd, 3rd, 4th toes, lateral right fore and mid foot,  will apply BioFreeze cream qid x 10 days, prn Tylenol is available to her, X-ray right foot Ap, lateral, oblique views to r/o fx, apply cold compress 73mn/each, 4x day x 5 days.   Unsteady gait Encourage safety awareness, risk for falling and injury, close supervision for safety. Continue electric w/c for mobility.   LEG EDEMA, BILATERAL Chronic, apply compression hosiery on am, off pm, knee high, mid weight once the right foot pain is subsided.      Family/ staff Communication: plan of care reviewed with the patient and charge nurse.   Labs/tests ordered: X-ray ap, lateral, oblique views of right foot.   Time spend 25 minutes.

## 2018-06-02 NOTE — Assessment & Plan Note (Signed)
Chronic, apply compression hosiery on am, off pm, knee high, mid weight once the right foot pain is subsided.

## 2018-06-03 DIAGNOSIS — M79671 Pain in right foot: Secondary | ICD-10-CM | POA: Diagnosis not present

## 2018-06-20 ENCOUNTER — Non-Acute Institutional Stay: Payer: Medicare HMO | Admitting: Internal Medicine

## 2018-06-20 ENCOUNTER — Encounter: Payer: Self-pay | Admitting: Internal Medicine

## 2018-06-20 DIAGNOSIS — N318 Other neuromuscular dysfunction of bladder: Secondary | ICD-10-CM | POA: Diagnosis not present

## 2018-06-20 DIAGNOSIS — J9611 Chronic respiratory failure with hypoxia: Secondary | ICD-10-CM | POA: Diagnosis not present

## 2018-06-20 DIAGNOSIS — I480 Paroxysmal atrial fibrillation: Secondary | ICD-10-CM | POA: Diagnosis not present

## 2018-06-20 DIAGNOSIS — J3089 Other allergic rhinitis: Secondary | ICD-10-CM | POA: Diagnosis not present

## 2018-06-20 DIAGNOSIS — G629 Polyneuropathy, unspecified: Secondary | ICD-10-CM | POA: Diagnosis not present

## 2018-06-20 NOTE — Progress Notes (Signed)
Location:  [facility] Room Number: 916 Place of Service:  ALF [phone]) Provider:  Blanchie Serve MD  Blanchie Serve, MD  Patient Care Team: Blanchie Serve, MD as PCP - General (Internal Medicine) Lanelle Bal., MD as Referring Physician (Internal Medicine) Satira Sark, MD as Consulting Physician (Cardiology) Kyung Rudd, MD as Consulting Physician (Radiation Oncology)  Extended Emergency Contact Information Primary Emergency Contact: Neita Garnet of [place] Phone: [phone] Relation: Son Secondary Emergency Contact: Hunley,Lance Address: [address]          [place], [place] [address] [name] of [place] Phone: [phone] Mobile Phone: [phone] Relation: Son  Code Status:  DNR  Goals of care: Advanced Directive information Advanced Directives 06/20/2018  Does Patient Have a Medical Advance Directive? Yes  Type of Advance Directive Living will;Out of facility DNR (pink MOST or yellow form)  Does patient want to make changes to medical advance directive? No - Patient declined  Copy of [place] in Chart? -  Would patient like information on creating a medical advance directive? -  Pre-existing out of facility DNR order (yellow form or pink MOST form) Pink MOST form placed in chart (order not valid for inpatient use)     Chief Complaint  Patient presents with  . Medical Management of Chronic Issues    Routine Visit     HPI:  Pt is a 84 y.o. female seen today for medical management of chronic diseases. She complaints of increased urinary frequency and urgency. She complaints of shortness of breath with minimal exertion and getting tired easily. She was treated for pneumonia in 03/2018. Denies any other concern this visit. She resides in ALF. She goes to dining area for meals.   afib- occasional palpitation. Takes atenolol 25 mg daily  Neuropathy- to her legs, uses motorized wheelchair for  ambulation, takes gabapentin 100 mg bid. Also on b complex and folic acid supplement  allergic rhinitis- stable, takes claritin daily   Past Medical History:  Diagnosis Date  . Allergic rhinitis   . Anemia    NOS  . Anxiety   . Biceps tendon rupture    Bilateral  . Breast cancer ([facility]) 2001   Left s/p lumpectomy and XRT   . Bronchiectasis ([facility]) 2014   Noted on chest x-ray  . Complication of anesthesia    slow to wake up  . Cough 2014  . Depression   . Diverticulosis   . Elevated liver enzymes    Biopsy consistent with steatohepatitis  . Fundic gland polyps of stomach, benign   . GERD (gastroesophageal reflux disease) 06/2007   EGD Dr Gala Romney >sm HH, multiple fundic gland polyps  . Glaucoma 2013   both eyes  . Hemorrhoids   . Hiatal hernia   . Hx of radiation therapy 07/17/10 to 07/21/10   SRS LLL lung  . Hyperlipidemia   . Insomnia   . Low back pain    scoliosis  . Lung cancer ([facility]) 05/2010   Left 2011 - rad x 3  . Lymphedema    Left arm  . Osteoarthritis   . Osteopenia 10/25/2016  . Osteoporosis, senile   . Overactive bladder   . Pneumonia    RLL with sepsis  . PSVT (paroxysmal supraventricular tachycardia) ([facility])   . Rheumatic fever    Age 47  . Right thyroid nodule   . Steatohepatitis    liver bx  . Type 2 diabetes mellitus ([facility])  Past Surgical History:  Procedure Laterality Date  . ABDOMINAL HYSTERECTOMY  1964  . APPENDECTOMY  1964  . BIOPSY THYROID  11/2008  . BREAST BIOPSY     X 3 w/ cystectomy  . BREAST LUMPECTOMY  2001  . CATARACT EXTRACTION Bilateral 2003   Lens implant Dr. Charise Killian  . CHOLECYSTECTOMY  1965  . COLONOSCOPY  09/11/2011   Procedure: COLONOSCOPY;  Surgeon: Dorothyann Peng, MD;  [facility];  Service: Endoscopy;  Laterality: N/A;  . COLONOSCOPY  2005 /2012   Dr. Lucianne Muss- diverticulosis, ext hemorrhoids.  . COLONOSCOPY WITH PROPOFOL N/A 11/09/2017   Procedure: COLONOSCOPY WITH PROPOFOL;  Surgeon: Mauri Pole, MD;  [facility];  Service: Endoscopy;  Laterality: N/A;  . COLONOSCOPY WITH PROPOFOL N/A 11/12/2017   Procedure: COLONOSCOPY WITH PROPOFOL;  Surgeon: Ladene Artist, MD;  [facility];  Service: Endoscopy;  Laterality: N/A;  . CYSTOSCOPY  2007  . ESOPHAGOGASTRODUODENOSCOPY  09/11/2011   Procedure: ESOPHAGOGASTRODUODENOSCOPY (EGD);  Surgeon: Dorothyann Peng, MD;  [facility];  Service: Endoscopy;  Laterality: N/A;  . ESOPHAGOGASTRODUODENOSCOPY (EGD) WITH PROPOFOL N/A 11/08/2017   Procedure: ESOPHAGOGASTRODUODENOSCOPY (EGD) WITH PROPOFOL;  Surgeon: Mauri Pole, MD;  [facility];  Service: Endoscopy;  Laterality: N/A;  . LIVER BIOPSY  2008  . LUNG BIOPSY Left 06/06/2010  . REVISION TOTAL HIP ARTHROPLASTY  2007   Left  . SKIN CANCER EXCISION     nose and left lower cheek  . TONSILLECTOMY  1930  . TOTAL HIP ARTHROPLASTY Bilateral 2005 & 2007    Dr. Earl Lagos. Aline Brochure     Allergies  Allergen Reactions  . Augmentin [Amoxicillin-Pot Clavulanate] Other (See Comments)    unknown  . Ibuprofen Hives  . Naproxen Other (See Comments)    unknown  . Statins Other (See Comments)    Feels like knives in stomach  . Surgilube [Gyne-Moistrin]     Rectal itching, burning  . Tape Rash    Outpatient Encounter Medications as of 06/20/2018  Medication Sig  . acetaminophen (TYLENOL) 325 MG tablet Take 650 mg by mouth every 6 (six) hours as needed.  Marland Kitchen atenolol (TENORMIN) 25 MG tablet Take 25 mg by mouth daily.  . B Complex Vitamins (B COMPLEX-B12) TABS Take 1 tablet by mouth daily.  . cetirizine (ZYRTEC) 10 MG tablet Take 10 mg by mouth daily.  . DULoxetine (CYMBALTA) 20 MG capsule TAKE (1) CAPSULE DAILY.  Marland Kitchen Ferrous Sulfate 27 MG TABS Take 1 tablet by mouth daily.  . folic acid (FOLVITE) 409 MCG tablet Take 400 mcg by mouth daily.  Marland Kitchen gabapentin (NEURONTIN) 100 MG capsule Take 100 mg by mouth 2 (two) times daily.  . hydrocortisone 2.5 % ointment Apply 1 application  topically 2 (two) times daily.  Marland Kitchen latanoprost (XALATAN) 0.005 % ophthalmic solution Place 1 drop into both eyes at bedtime.  . lidocaine (LIDODERM) 5 % Place 1 patch onto the skin daily as needed. Remove & Discard patch within 12 hours or as directed by MD   . Multiple Vitamins-Minerals (PRESERVISION AREDS 2 PO) Take 1 capsule by mouth 2 (two) times daily.   Marland Kitchen omeprazole (PRILOSEC) 20 MG capsule TAKE 1 CAPSULE EVERY DAY.  Marland Kitchen polyethylene glycol (MIRALAX / GLYCOLAX) packet Take 17 g by mouth daily as needed.   . Simethicone 80 MG TABS Take 1 tablet by mouth 4 (four) times daily as needed.   No facility-administered encounter medications on file as of 06/20/2018.  Review of Systems  Constitutional: Positive for fatigue. Negative for appetite change, chills, diaphoresis and fever.  HENT: Positive for rhinorrhea. Negative for congestion, ear pain, hearing loss, mouth sores, sinus pressure, sinus pain and trouble swallowing.   Respiratory: Positive for shortness of breath. Negative for choking.        Occasional cough, on oxygen by nasal canula. History of lung cancer s/p radiation  Cardiovascular: Negative for chest pain and leg swelling.  Gastrointestinal: Negative for abdominal pain, diarrhea, nausea and vomiting.  Genitourinary: Positive for frequency and urgency. Negative for dysuria and flank pain.  Musculoskeletal: Positive for arthralgias and gait problem. Negative for back pain.  Neurological: Positive for numbness. Negative for light-headedness and headaches.  Psychiatric/Behavioral: Negative for behavioral problems and confusion.    Immunization History  Administered Date(s) Administered  . Influenza Whole 09/22/2008, 09/16/2012, 09/30/2013  . Influenza, High Dose Seasonal PF 09/25/2017  . Influenza-Unspecified 10/02/2014, 09/15/2015, 09/27/2016  . Pneumococcal Polysaccharide-23 08/17/2006  . Td 12/17/2005   Pertinent  Health Maintenance Due  Topic Date Due  . FOOT EXAM   07/19/1931  . OPHTHALMOLOGY EXAM  07/19/1931  . PNA vac Low Risk Adult (2 of 2 - PCV13) 08/18/2007  . URINE MICROALBUMIN  04/30/2008  . HEMOGLOBIN A1C  10/17/2017  . INFLUENZA VACCINE  07/17/2018  . DEXA SCAN  Completed   Fall Risk  09/19/2017 05/17/2016 04/20/2016 04/19/2016  Falls in the past year? No No No No   Functional Status Survey:    Vitals:   06/20/18 1114  BP: 128/80  Pulse: 76  Resp: 18  Temp: 99.5 F (37.5 C)  TempSrc: Oral  SpO2: 97%  Weight: 148 lb 3.2 oz (67.2 kg)  Height: 5' 4"  (1.626 m)   Body mass index is 25.44 kg/m.   Wt Readings from Last 3 Encounters:  06/20/18 148 lb 3.2 oz (67.2 kg)  06/02/18 148 lb 3.2 oz (67.2 kg)  04/08/18 145 lb 8 oz (66 kg)   Physical Exam  Constitutional: She is oriented to person, place, and time. She appears well-developed and well-nourished. No distress.  HENT:  Head: Normocephalic and atraumatic.  Right Ear: External ear normal.  Left Ear: External ear normal.  Nose: Nose normal.  Mouth/Throat: Oropharynx is clear and moist. No oropharyngeal exudate.  Eyes: Pupils are equal, round, and reactive to light. Conjunctivae and EOM are normal. Right eye exhibits no discharge. Left eye exhibits no discharge.  Neck: Normal range of motion. Neck supple.  Cardiovascular: Normal rate and regular rhythm.  Murmur heard. Pulmonary/Chest: Effort normal and breath sounds normal. No respiratory distress. She has no wheezes. She has no rales.  On oxygen by nasal canula  Abdominal: Soft. Bowel sounds are normal. There is no tenderness. There is no guarding.  Musculoskeletal: She exhibits edema.  1+ pitting edema, ted hose in place, can move all 4 extremities, unsteady gait, uses motorized wheelchair for ambulation  Lymphadenopathy:    She has no cervical adenopathy.  Neurological: She is alert and oriented to person, place, and time.  Skin: Skin is warm and dry. She is not diaphoretic.  Psychiatric: She has a normal mood and affect.     Labs reviewed: Recent Labs    11/05/17 0346  12/24/17 0700 01/16/18 0842 01/23/18 03/25/18 04/15/18  NA 135   < > 135 136 138 134* 134*  K 3.9   < > 3.9 3.5 3.8 3.2* 4.1  CL 109  --  97* 101  --   --  99  CO2 22  --  31 29  --   --  28  GLUCOSE 119*  --  79 99  --   --   --   BUN 34*   < > 15 18 19 11 16   CREATININE 0.84   < > 0.78 0.70 0.7 0.7 0.8  CALCIUM 8.2*  --  10.0 9.8  --   --  9.2   < > = values in this interval not displayed.   Recent Labs    07/24/17 0931 08/21/17 0000 11/03/17 1818  12/05/17 12/24/17 0700 01/23/18 03/25/18  AST 17  17 16 20    < > 23 17  17 23 18   ALT 11  11 14  13*   < > 20 12  12 16 13   ALKPHOS 90  90  --  78   < > 97  --  217* 133*  BILITOT 0.4  0.4 0.3 0.4  --   --  0.4  0.4  --   --   PROT 7.0  7.0 7.0 6.5  --   --  7.2  7.2  --   --   ALBUMIN 3.6  3.6  --  3.1*  --   --   --   --   --    < > = values in this interval not displayed.   Recent Labs    11/15/17 0357  12/24/17 0700 01/16/18 0842 01/23/18 03/25/18 04/24/18  WBC 9.6   < > 6.5 10.3 7.0 7.3 7.8  HGB 9.7*   < > 10.7* 11.0* 10.9* 9.3* 9.6*  HCT 29.9*   < > 31.9* 32.7* 32* 29* 29*  MCV 88.7  --  86.4 88.4  --   --   --   PLT 270   < > 389 278 369 341 346   < > = values in this interval not displayed.   Lab Results  Component Value Date   TSH 2.00 11/20/2017   Lab Results  Component Value Date   HGBA1C 5.3 04/16/2017   Lab Results  Component Value Date   CHOL 188 04/12/2016   HDL 62 04/12/2016   LDLCALC 102 04/12/2016   TRIG 122 04/12/2016   CHOLHDL 3.0 Ratio 06/07/2008    Significant Diagnostic Results in last 30 days:  No results found.  Assessment/Plan  1. Non-seasonal allergic rhinitis, unspecified trigger Continue claritin  2. Paroxysmal atrial fibrillation (HCC) Continue atenolol, off anticoagulation with history of GI bleed.  3. OVERACTIVE BLADDER Start mirabegron 25 mg daily and monitor, perineal care  4. Neuropathy Continue  gabapentin and monitor  5. Chronic respiratory failure with hypoxia (HCC) With history of left lung cancer, broncheactasis, recent pneumonia and chronic anemia. Continue o2 by nasal canula. No wheeze or rales on exam. Supportive care   Family/ staff Communication: reviewed care plan with patient and charge nurse.    Labs/tests ordered:  none   Blanchie Serve, MD Internal Medicine [facility] [address] [place], [place] [address] Cell Phone (Monday-Friday 8 am - 5 pm): [phone] On Call: [phone] and follow prompts after 5 pm and on weekends Office Phone: [phone] Office Fax: [phone]

## 2018-07-23 ENCOUNTER — Encounter: Payer: Self-pay | Admitting: Nurse Practitioner

## 2018-07-23 ENCOUNTER — Non-Acute Institutional Stay: Payer: Medicare HMO | Admitting: Nurse Practitioner

## 2018-07-23 DIAGNOSIS — J181 Lobar pneumonia, unspecified organism: Secondary | ICD-10-CM | POA: Diagnosis not present

## 2018-07-23 DIAGNOSIS — N318 Other neuromuscular dysfunction of bladder: Secondary | ICD-10-CM | POA: Diagnosis not present

## 2018-07-23 DIAGNOSIS — R05 Cough: Secondary | ICD-10-CM | POA: Diagnosis not present

## 2018-07-23 DIAGNOSIS — R0602 Shortness of breath: Secondary | ICD-10-CM

## 2018-07-23 DIAGNOSIS — I509 Heart failure, unspecified: Secondary | ICD-10-CM | POA: Diagnosis not present

## 2018-07-23 DIAGNOSIS — B373 Candidiasis of vulva and vagina: Secondary | ICD-10-CM

## 2018-07-23 DIAGNOSIS — E871 Hypo-osmolality and hyponatremia: Secondary | ICD-10-CM | POA: Diagnosis not present

## 2018-07-23 DIAGNOSIS — J189 Pneumonia, unspecified organism: Secondary | ICD-10-CM

## 2018-07-23 DIAGNOSIS — B3731 Acute candidiasis of vulva and vagina: Secondary | ICD-10-CM | POA: Insufficient documentation

## 2018-07-23 DIAGNOSIS — D500 Iron deficiency anemia secondary to blood loss (chronic): Secondary | ICD-10-CM

## 2018-07-23 NOTE — Progress Notes (Signed)
Location:  [facility] Room Number: 916 Place of Service:  ALF [phone]) Provider:  Aliese Brannum, ManXie  NP  Blanchie Serve, MD  Patient Care Team: Blanchie Serve, MD as PCP - General (Internal Medicine) Lanelle Bal., MD as Referring Physician (Internal Medicine) Satira Sark, MD as Consulting Physician (Cardiology) Kyung Rudd, MD as Consulting Physician (Radiation Oncology)  Extended Emergency Contact Information Primary Emergency Contact: Neita Garnet of [place] Phone: [phone] Relation: Son Secondary Emergency Contact: Staff,Lance Address: [address]          [place], [place] [address] [name] of [place] Phone: [phone] Mobile Phone: [phone] Relation: Son  Code Status:  DNR Goals of care: Advanced Directive information Advanced Directives 07/23/2018  Does Patient Have a Medical Advance Directive? Yes  Type of Advance Directive Living will;Out of facility DNR (pink MOST or yellow form)  Does patient want to make changes to medical advance directive? No - Patient declined  Copy of [place] in Chart? -  Would patient like information on creating a medical advance directive? -  Pre-existing out of facility DNR order (yellow form or pink MOST form) Yellow form placed in chart (order not valid for inpatient use)     Chief Complaint  Patient presents with  . Acute Visit    C/o- vaginal discharge, chest feels funny, ? meds,     HPI:  Pt is a 84 y.o. female seen today for an acute visit for 1 yellowish vaginal discharge about a month so, self administered Monistat x3, effective, but relapsed now, denied itching or burning sensation in vaginal area. 2 urinary frequency 1-2 hours bathroom trips, requested to discontinue Myrbetriq since it has little efficacy and it makes her feel awful. 3. SOB, cough, no O2 desaturation, but O2 2lpm via [place] makes her feel better, denied chest pain, sputum  production, palpitation, desires CXR to evaluate further. 4 Hx of anemia and hyponatremia, the patient requested monthly check of CBC and CMP   Past Medical History:  Diagnosis Date  . Allergic rhinitis   . Anemia    NOS  . Anxiety   . Biceps tendon rupture    Bilateral  . Breast cancer ([place]) 2001   Left s/p lumpectomy and XRT   . Bronchiectasis ([place]) 2014   Noted on chest x-ray  . Complication of anesthesia    slow to wake up  . Cough 2014  . Depression   . Diverticulosis   . Elevated liver enzymes    Biopsy consistent with steatohepatitis  . Fundic gland polyps of stomach, benign   . GERD (gastroesophageal reflux disease) 06/2007   EGD Dr Gala Romney >sm HH, multiple fundic gland polyps  . Glaucoma 2013   both eyes  . Hemorrhoids   . Hiatal hernia   . Hx of radiation therapy 07/17/10 to 07/21/10   SRS LLL lung  . Hyperlipidemia   . Insomnia   . Low back pain    scoliosis  . Lung cancer ([place]) 05/2010   Left 2011 - rad x 3  . Lymphedema    Left arm  . Osteoarthritis   . Osteopenia 10/25/2016  . Osteoporosis, senile   . Overactive bladder   . Pneumonia    RLL with sepsis  . PSVT (paroxysmal supraventricular tachycardia) ([place])   . Rheumatic fever    Age 22  . Right thyroid nodule   . Steatohepatitis    liver bx  . Type 2 diabetes  mellitus [facility])    Past Surgical History:  Procedure Laterality Date  . ABDOMINAL HYSTERECTOMY  1964  . APPENDECTOMY  1964  . BIOPSY THYROID  11/2008  . BREAST BIOPSY     X 3 w/ cystectomy  . BREAST LUMPECTOMY  2001  . CATARACT EXTRACTION Bilateral 2003   Lens implant Dr. Charise Killian  . CHOLECYSTECTOMY  1965  . COLONOSCOPY  09/11/2011   Procedure: COLONOSCOPY;  Surgeon: Dorothyann Peng, MD;  [facility];  Service: Endoscopy;  Laterality: N/A;  . COLONOSCOPY  2005 /2012   Dr. Lucianne Muss- diverticulosis, ext hemorrhoids.  . COLONOSCOPY WITH PROPOFOL N/A 11/09/2017   Procedure: COLONOSCOPY WITH PROPOFOL;  Surgeon: Mauri Pole, MD;   Location: [facility];  Service: Endoscopy;  Laterality: N/A;  . COLONOSCOPY WITH PROPOFOL N/A 11/12/2017   Procedure: COLONOSCOPY WITH PROPOFOL;  Surgeon: Ladene Artist, MD;  Location: [facility];  Service: Endoscopy;  Laterality: N/A;  . CYSTOSCOPY  2007  . ESOPHAGOGASTRODUODENOSCOPY  09/11/2011   Procedure: ESOPHAGOGASTRODUODENOSCOPY (EGD);  Surgeon: Dorothyann Peng, MD;  [facility];  Service: Endoscopy;  Laterality: N/A;  . ESOPHAGOGASTRODUODENOSCOPY (EGD) WITH PROPOFOL N/A 11/08/2017   Procedure: ESOPHAGOGASTRODUODENOSCOPY (EGD) WITH PROPOFOL;  Surgeon: Mauri Pole, MD;  [facility];  Service: Endoscopy;  Laterality: N/A;  . LIVER BIOPSY  2008  . LUNG BIOPSY Left 06/06/2010  . REVISION TOTAL HIP ARTHROPLASTY  2007   Left  . SKIN CANCER EXCISION     nose and left lower cheek  . TONSILLECTOMY  1930  . TOTAL HIP ARTHROPLASTY Bilateral 2005 & 2007    Dr. Earl Lagos. Aline Brochure     Allergies  Allergen Reactions  . Augmentin [Amoxicillin-Pot Clavulanate] Other (See Comments)    unknown  . Ibuprofen Hives  . Naproxen Other (See Comments)    unknown  . Statins Other (See Comments)    Feels like knives in stomach  . Surgilube [Gyne-Moistrin]     Rectal itching, burning  . Tape Rash    Outpatient Encounter Medications as of 07/23/2018  Medication Sig  . acetaminophen (TYLENOL) 325 MG tablet Take 650 mg by mouth every 6 (six) hours as needed.  Marland Kitchen atenolol (TENORMIN) 25 MG tablet Take 25 mg by mouth daily.  . B Complex Vitamins (B COMPLEX-B12) TABS Take 1 tablet by mouth daily.  . cetirizine (ZYRTEC) 10 MG tablet Take 10 mg by mouth daily.  . DULoxetine (CYMBALTA) 20 MG capsule TAKE (1) CAPSULE DAILY.  Marland Kitchen Ferrous Sulfate 27 MG TABS Take 1 tablet by mouth daily.  . folic acid (FOLVITE) 195 MCG tablet Take 400 mcg by mouth daily.  Marland Kitchen gabapentin (NEURONTIN) 100 MG capsule Take 100 mg by mouth 2 (two) times daily.  . hydrocortisone 2.5 % ointment Apply 1  application topically 2 (two) times daily.  Marland Kitchen latanoprost (XALATAN) 0.005 % ophthalmic solution Place 1 drop into both eyes at bedtime.  . lidocaine (LIDODERM) 5 % Place 1 patch onto the skin daily as needed. Remove & Discard patch within 12 hours or as directed by MD   . Multiple Vitamins-Minerals (PRESERVISION AREDS 2 PO) Take 1 capsule by mouth 2 (two) times daily.   Marland Kitchen omeprazole (PRILOSEC) 20 MG capsule TAKE 1 CAPSULE EVERY DAY.  Marland Kitchen polyethylene glycol (MIRALAX / GLYCOLAX) packet Take 17 g by mouth daily as needed.   . Simethicone 80 MG TABS Take 1 tablet by mouth 4 (four) times daily as needed.   No facility-administered encounter medications on file  as of 07/23/2018.     Review of Systems  Constitutional: Negative for activity change, appetite change, chills, diaphoresis and fatigue.  HENT: Positive for hearing loss. Negative for congestion and voice change.   Respiratory: Positive for cough and shortness of breath. Negative for chest tightness and wheezing.   Cardiovascular: Positive for leg swelling. Negative for chest pain and palpitations.  Gastrointestinal: Negative for abdominal distention, abdominal pain, constipation, diarrhea, nausea and vomiting.  Genitourinary: Positive for frequency. Negative for difficulty urinating, dysuria and urgency.  Musculoskeletal: Positive for arthralgias and gait problem.  Skin: Negative for color change and pallor.  Neurological: Negative for dizziness, speech difficulty, weakness and headaches.  Psychiatric/Behavioral: Negative for agitation, behavioral problems, confusion, hallucinations and sleep disturbance. The patient is not nervous/anxious.     Immunization History  Administered Date(s) Administered  . Influenza Whole 09/22/2008, 09/16/2012, 09/30/2013  . Influenza, High Dose Seasonal PF 09/25/2017  . Influenza-Unspecified 10/02/2014, 09/15/2015, 09/27/2016  . Pneumococcal Polysaccharide-23 08/17/2006  . Td 12/17/2005   Pertinent   Health Maintenance Due  Topic Date Due  . FOOT EXAM  07/19/1931  . OPHTHALMOLOGY EXAM  07/19/1931  . PNA vac Low Risk Adult (2 of 2 - PCV13) 08/18/2007  . URINE MICROALBUMIN  04/30/2008  . HEMOGLOBIN A1C  10/17/2017  . INFLUENZA VACCINE  07/17/2018  . DEXA SCAN  Completed   Fall Risk  09/19/2017 05/17/2016 04/20/2016 04/19/2016  Falls in the past year? No No No No   Functional Status Survey:    Vitals:   07/23/18 1510  BP: 134/80  Pulse: 62  Resp: 18  Temp: 98.6 F (37 C)  SpO2: 93%  Weight: 141 lb 9.6 oz (64.2 kg)  Height: 5' 4"  (1.626 m)   Body mass index is 24.31 kg/m. Physical Exam  Constitutional: She is oriented to person, place, and time. She appears well-developed and well-nourished. No distress.  HENT:  Head: Normocephalic and atraumatic.  Eyes: Pupils are equal, round, and reactive to light. EOM are normal. Right eye exhibits no discharge. Left eye exhibits no discharge.  Neck: Normal range of motion. Neck supple. No JVD present. No thyromegaly present.  Cardiovascular: Normal rate.  Irregular heart beats.   Pulmonary/Chest: No stridor. No respiratory distress. She has no wheezes. She has no rales.  o2 2lpm via [place] prn  Abdominal: Soft. Bowel sounds are normal. She exhibits no distension. There is no tenderness.  Musculoskeletal: She exhibits edema.  Trace edema BLE,, self transfer, electric scooter for mobility.   Neurological: She is alert and oriented to person, place, and time. No cranial nerve deficit. She exhibits normal muscle tone. Coordination normal.  Skin: Skin is warm and dry. She is not diaphoretic.  Psychiatric: She has a normal mood and affect. Her behavior is normal.    Labs reviewed: Recent Labs    11/05/17 0346  12/24/17 0700 01/16/18 0842  03/25/18 04/15/18 07/24/18  NA 135   < > 135 136   < > 134* 134* 136*  K 3.9   < > 3.9 3.5   < > 3.2* 4.1 2.6*  CL 109  --  97* 101  --   --  99 92  CO2 22  --  31 29  --   --  28 36  GLUCOSE 119*  --   79 99  --   --   --   --   BUN 34*   < > 15 18   < > 11 16 17   CREATININE 0.84   < >  0.78 0.70   < > 0.7 0.8 0.8  CALCIUM 8.2*  --  10.0 9.8  --   --  9.2 8.8   < > = values in this interval not displayed.   Recent Labs    08/21/17 0000 11/03/17 1818  12/24/17 0700 01/23/18 03/25/18 07/24/18  AST 16 20   < > 17  17 23 18 26   ALT 14 13*   < > 12  12 16 13 17   ALKPHOS  --  78   < >  --  217* 133* 105  BILITOT 0.3 0.4  --  0.4  0.4  --   --   --   PROT 7.0 6.5  --  7.2  7.2  --   --  6.5  ALBUMIN  --  3.1*  --   --   --   --  3.4   < > = values in this interval not displayed.   Recent Labs    11/15/17 0357  12/24/17 0700 01/16/18 0842 01/23/18 03/25/18 04/24/18  WBC 9.6   < > 6.5 10.3 7.0 7.3 7.8  HGB 9.7*   < > 10.7* 11.0* 10.9* 9.3* 9.6*  HCT 29.9*   < > 31.9* 32.7* 32* 29* 29*  MCV 88.7  --  86.4 88.4  --   --   --   PLT 270   < > 389 278 369 341 346   < > = values in this interval not displayed.   Lab Results  Component Value Date   TSH 2.00 11/20/2017   Lab Results  Component Value Date   HGBA1C 5.3 04/16/2017   Lab Results  Component Value Date   CHOL 188 04/12/2016   HDL 62 04/12/2016   LDLCALC 102 04/12/2016   TRIG 122 04/12/2016   CHOLHDL 3.0 Ratio 06/07/2008    Significant Diagnostic Results in last 30 days:  No results found.  Assessment/Plan Yeast vaginitis yellowish vaginal discharge about a month so, self administered Monistat x3, effective, but relapsed now, denied itching or burning sensation in vaginal area. Will administer Diflucan 145m po x1, then Monistat 7. Observe.   OVERACTIVE BLADDER urinary frequency 1-2 hours bathroom trips, requested to discontinue Myrbetriq since it has little efficacy and it makes her feel awful.   SOB (shortness of breath) SOB, cough, no O2 desaturation, but O2 2lpm via [place] makes her feel better, denied chest pain, sputum production, palpitation, desires CXR to evaluate further.   Blood loss anemia Hx of  anemia, last Hgb 9.3, continue BC94 folic acid, Iron, will monitor CBC monthly.   Hyponatremia Hx of low sodium, last Na 134, will monitor CMP monthly.   Pneumonia 07/23/18 CXR pulmonary infiltrate in the left and middle and lower lung fields, 07/24/18 Levaquin 5073mpo x1 for day 1, then 25033mo qd for day 2-6, DuoNeb q6h x 3 days.   [place]est[place] ([place]) [place]/7/19 CXR pulmonary venous congestion, pulmonary infiltrate in the left and middle and lower lung fields and bilateral pleural effusion consistent with congestive heart failure. Will treat for PNA, obtain echocardiogram to evaluate further. Observe.     Family/ staff Communication: plan of care reviewed with the patient and charge nurse.   Labs/tests ordered:  CXR. CBC/CMP monthly, echocardiogram  Time spend 25 minutes.

## 2018-07-23 NOTE — Assessment & Plan Note (Signed)
yellowish vaginal discharge about a month so, self administered Monistat x3, effective, but relapsed now, denied itching or burning sensation in vaginal area. Will administer Diflucan 121m po x1, then Monistat 7. Observe.

## 2018-07-23 NOTE — Assessment & Plan Note (Signed)
Hx of anemia, last Hgb 9.3, continue Y38, folic acid, Iron, will monitor CBC monthly.

## 2018-07-23 NOTE — Assessment & Plan Note (Signed)
Hx of low sodium, last Na 134, will monitor CMP monthly.

## 2018-07-23 NOTE — Assessment & Plan Note (Signed)
urinary frequency 1-2 hours bathroom trips, requested to discontinue Myrbetriq since it has little efficacy and it makes her feel awful.

## 2018-07-23 NOTE — Assessment & Plan Note (Signed)
SOB, cough, no O2 desaturation, but O2 2lpm via [place] makes her feel better, denied chest pain, sputum production, palpitation, desires CXR to evaluate further.

## 2018-07-24 ENCOUNTER — Other Ambulatory Visit: Payer: Self-pay | Admitting: *Deleted

## 2018-07-24 ENCOUNTER — Encounter: Payer: Self-pay | Admitting: Nurse Practitioner

## 2018-07-24 DIAGNOSIS — I509 Heart failure, unspecified: Secondary | ICD-10-CM | POA: Insufficient documentation

## 2018-07-24 DIAGNOSIS — R69 Illness, unspecified: Secondary | ICD-10-CM | POA: Diagnosis not present

## 2018-07-24 DIAGNOSIS — I10 Essential (primary) hypertension: Secondary | ICD-10-CM | POA: Diagnosis not present

## 2018-07-24 DIAGNOSIS — J189 Pneumonia, unspecified organism: Secondary | ICD-10-CM | POA: Insufficient documentation

## 2018-07-24 LAB — HEPATIC FUNCTION PANEL
ALT: 17 (ref 7–35)
AST: 26 (ref 13–35)
Alkaline Phosphatase: 105 (ref 25–125)
BILIRUBIN, TOTAL: 1

## 2018-07-24 LAB — BASIC METABOLIC PANEL
BUN: 17 (ref 4–21)
Creatinine: 0.8 (ref <=1.1)
Glucose: 92
Potassium: 2.6 — AB (ref 3.4–5.3)
Sodium: 136 — AB (ref 137–147)

## 2018-07-24 LAB — COMPLETE METABOLIC PANEL WITH GFR
ALBUMIN: 3.4
CHLORIDE: 92
CO2: 36
Calcium: 8.8
EGFR (Non-African Amer.): 61
Globulin: 3.1
Total Protein: 6.5 g/dL

## 2018-07-24 LAB — CBC AND DIFFERENTIAL
HCT: 30 — AB (ref 36–46)
Hemoglobin: 10.4 — AB (ref 12.0–16.0)
PLATELETS: 210 (ref 150–399)
WBC: 6

## 2018-07-24 NOTE — Assessment & Plan Note (Signed)
07/23/18 CXR pulmonary venous congestion, pulmonary infiltrate in the left and middle and lower lung fields and bilateral pleural effusion consistent with congestive heart failure. Will treat for PNA, obtain echocardiogram to evaluate further. Observe.

## 2018-07-24 NOTE — Assessment & Plan Note (Signed)
07/23/18 CXR pulmonary infiltrate in the left and middle and lower lung fields, 07/24/18 Levaquin 567m po x1 for day 1, then 2529mpo qd for day 2-6, DuoNeb q6h x 3 days.

## 2018-07-25 DIAGNOSIS — E876 Hypokalemia: Secondary | ICD-10-CM | POA: Diagnosis not present

## 2018-07-25 DIAGNOSIS — I10 Essential (primary) hypertension: Secondary | ICD-10-CM | POA: Diagnosis not present

## 2018-07-25 LAB — BASIC METABOLIC PANEL: Potassium: 3.6 (ref 3.4–5.3)

## 2018-07-28 ENCOUNTER — Other Ambulatory Visit: Payer: Self-pay | Admitting: *Deleted

## 2018-07-29 DIAGNOSIS — I509 Heart failure, unspecified: Secondary | ICD-10-CM | POA: Diagnosis not present

## 2018-07-30 ENCOUNTER — Encounter: Payer: Self-pay | Admitting: Internal Medicine

## 2018-07-30 ENCOUNTER — Non-Acute Institutional Stay: Payer: Medicare HMO | Admitting: Nurse Practitioner

## 2018-07-30 ENCOUNTER — Encounter: Payer: Self-pay | Admitting: Nurse Practitioner

## 2018-07-30 DIAGNOSIS — R630 Anorexia: Secondary | ICD-10-CM | POA: Diagnosis not present

## 2018-07-30 DIAGNOSIS — E876 Hypokalemia: Secondary | ICD-10-CM | POA: Diagnosis not present

## 2018-07-30 DIAGNOSIS — I270 Primary pulmonary hypertension: Secondary | ICD-10-CM

## 2018-07-30 DIAGNOSIS — J181 Lobar pneumonia, unspecified organism: Secondary | ICD-10-CM

## 2018-07-30 DIAGNOSIS — J189 Pneumonia, unspecified organism: Secondary | ICD-10-CM

## 2018-07-30 DIAGNOSIS — I509 Heart failure, unspecified: Secondary | ICD-10-CM

## 2018-07-30 NOTE — Assessment & Plan Note (Signed)
07/23/18 CXR left mid and lower lung infiltrates, completed 7 day course of Levaquin today. She is afebrile.

## 2018-07-30 NOTE — Assessment & Plan Note (Addendum)
#  148Ibs 06/2018, # 141 Ibs 07/2018. C/o poor appetite. Will try Mirtazapine 7.78m po qhs, monitor weight weekly x 4, observe.

## 2018-07-30 NOTE — Assessment & Plan Note (Signed)
04/15/18 Na 134, K 4.1, Bun 16, creat 0.75 07/24/18 K 2.6, Kcl 42mq qd, serum K in am 07/25/18 K 3.6, f/u BMP 1 week.

## 2018-07-30 NOTE — Assessment & Plan Note (Signed)
07/23/18 CXR venous pulmonary congestion, pending echocardiogram. She has edema 1+BLE. The patient desires delaying of Furosemide, POA was present during today's visit.

## 2018-07-30 NOTE — Progress Notes (Addendum)
Location:  [facility] Room Number: 916 Place of Service:  ALF [phone]) Provider:  Kazim Corrales, ManXie  NP  Blanchie Serve, MD  Patient Care Team: Blanchie Serve, MD as PCP - General (Internal Medicine) Lanelle Bal., MD as Referring Physician (Internal Medicine) Satira Sark, MD as Consulting Physician (Cardiology) Kyung Rudd, MD as Consulting Physician (Radiation Oncology)  Extended Emergency Contact Information Primary Emergency Contact: Neita Garnet of [place] Phone: [phone] Relation: Son Secondary Emergency Contact: Yam,Lance Address: [address]          [place], [place] [address] [name] of [place] Phone: [phone] Mobile Phone: [phone] Relation: Son  Code Status:  DNR Goals of care: Advanced Directive information Advanced Directives 07/23/2018  Does Patient Have a Medical Advance Directive? Yes  Type of Advance Directive Living will;Out of facility DNR (pink MOST or yellow form)  Does patient want to make changes to medical advance directive? No - Patient declined  Copy of [place] in Chart? -  Would patient like information on creating a medical advance directive? -  Pre-existing out of facility DNR order (yellow form or pink MOST form) Yellow form placed in chart (order not valid for inpatient use)     Chief Complaint  Patient presents with  . Acute Visit    Medication changes    HPI:  Pt is a 84 y.o. female seen today for an acute visit for poor appetite, weight loss # 7-8Ibs in one month. She denied nausea, vomiting, difficulty swallowing, heartburn, indigestion, nausea, vomiting, constipation, or abd pain. She is concerned of her SOB, persisted, she denied chest pain, wheezing cough, or sputum production. 07/23/18 CXR left mid and lower lung infiltrates, completed 7 day course of Levaquin today. She is afebrile. 07/23/18 CXR venous pulmonary congestion, pending echocardiogram.  She has edema 1+BLE. The patient desires delaying of Furosemide, POA was present during today's visit. Incidental finding of serum K 2.6 07/24/18, replete with Kcl, serum K 3.6 07/25/18     Past Medical History:  Diagnosis Date  . Allergic rhinitis   . Anemia    NOS  . Anxiety   . Biceps tendon rupture    Bilateral  . Breast cancer ([place]) 2001   Left s/p lumpectomy and XRT   . Bronchiectasis ([place]) 2014   Noted on chest x-ray  . Complication of anesthesia    slow to wake up  . Cough 2014  . Depression   . Diverticulosis   . Elevated liver enzymes    Biopsy consistent with steatohepatitis  . Fundic gland polyps of stomach, benign   . GERD (gastroesophageal reflux disease) 06/2007   EGD Dr Gala Romney >sm HH, multiple fundic gland polyps  . Glaucoma 2013   both eyes  . Hemorrhoids   . Hiatal hernia   . Hx of radiation therapy 07/17/10 to 07/21/10   SRS LLL lung  . Hyperlipidemia   . Insomnia   . Low back pain    scoliosis  . Lung cancer ([place]) 05/2010   Left 2011 - rad x 3  . Lymphedema    Left arm  . Osteoarthritis   . Osteopenia 10/25/2016  . Osteoporosis, senile   . Overactive bladder   . Pneumonia    RLL with sepsis  . PSVT (paroxysmal supraventricular tachycardia) ([place])   . Rheumatic fever    Age 18  . Right thyroid nodule   . Steatohepatitis    liver bx  . Type  2 diabetes mellitus ([facility])    Past Surgical History:  Procedure Laterality Date  . ABDOMINAL HYSTERECTOMY  1964  . APPENDECTOMY  1964  . BIOPSY THYROID  11/2008  . BREAST BIOPSY     X 3 w/ cystectomy  . BREAST LUMPECTOMY  2001  . CATARACT EXTRACTION Bilateral 2003   Lens implant Dr. Charise Killian  . CHOLECYSTECTOMY  1965  . COLONOSCOPY  09/11/2011   Procedure: COLONOSCOPY;  Surgeon: Dorothyann Peng, MD;  [facility];  Service: Endoscopy;  Laterality: N/A;  . COLONOSCOPY  2005 /2012   Dr. Lucianne Muss- diverticulosis, ext hemorrhoids.  . COLONOSCOPY WITH PROPOFOL N/A 11/09/2017   Procedure: COLONOSCOPY WITH  PROPOFOL;  Surgeon: Mauri Pole, MD;  Location: [facility];  Service: Endoscopy;  Laterality: N/A;  . COLONOSCOPY WITH PROPOFOL N/A 11/12/2017   Procedure: COLONOSCOPY WITH PROPOFOL;  Surgeon: Ladene Artist, MD;  Location: [facility];  Service: Endoscopy;  Laterality: N/A;  . CYSTOSCOPY  2007  . ESOPHAGOGASTRODUODENOSCOPY  09/11/2011   Procedure: ESOPHAGOGASTRODUODENOSCOPY (EGD);  Surgeon: Dorothyann Peng, MD;  [facility];  Service: Endoscopy;  Laterality: N/A;  . ESOPHAGOGASTRODUODENOSCOPY (EGD) WITH PROPOFOL N/A 11/08/2017   Procedure: ESOPHAGOGASTRODUODENOSCOPY (EGD) WITH PROPOFOL;  Surgeon: Mauri Pole, MD;  Location[facility];  Service: Endoscopy;  Laterality: N/A;  . LIVER BIOPSY  2008  . LUNG BIOPSY Left 06/06/2010  . REVISION TOTAL HIP ARTHROPLASTY  2007   Left  . SKIN CANCER EXCISION     nose and left lower cheek  . TONSILLECTOMY  1930  . TOTAL HIP ARTHROPLASTY Bilateral 2005 & 2007    Dr. Earl Lagos. Aline Brochure     Allergies  Allergen Reactions  . Augmentin [Amoxicillin-Pot Clavulanate] Other (See Comments)    unknown  . Ibuprofen Hives  . Naproxen Other (See Comments)    unknown  . Statins Other (See Comments)    Feels like knives in stomach  . Surgilube [Gyne-Moistrin]     Rectal itching, burning  . Tape Rash    Outpatient Encounter Medications as of 07/30/2018  Medication Sig  . acetaminophen (TYLENOL) 325 MG tablet Take 650 mg by mouth every 6 (six) hours as needed.  Marland Kitchen atenolol (TENORMIN) 25 MG tablet Take 25 mg by mouth daily.  . B Complex Vitamins (B COMPLEX-B12) TABS Take 1 tablet by mouth daily.  . cetirizine (ZYRTEC) 10 MG tablet Take 10 mg by mouth daily.  . DULoxetine (CYMBALTA) 20 MG capsule TAKE (1) CAPSULE DAILY.  Marland Kitchen Ferrous Sulfate 27 MG TABS Take 1 tablet by mouth daily.  . folic acid (FOLVITE) 081 MCG tablet Take 400 mcg by mouth daily.  Marland Kitchen gabapentin (NEURONTIN) 100 MG capsule Take 100 mg by mouth 2 (two) times  daily.  . hydrocortisone 2.5 % ointment Apply 1 application topically daily as needed.   Marland Kitchen ipratropium-albuterol (DUONEB) 0.5-2.5 (3) MG/3ML SOLN Take 3 mLs by nebulization every 6 (six) hours as needed.  . latanoprost (XALATAN) 0.005 % ophthalmic solution Place 1 drop into both eyes at bedtime.  . lidocaine (LIDODERM) 5 % Place 1 patch onto the skin daily as needed. Remove & Discard patch within 12 hours or as directed by MD   . Multiple Vitamins-Minerals (PRESERVISION AREDS 2 PO) Take 1 capsule by mouth 2 (two) times daily.   Marland Kitchen omeprazole (PRILOSEC) 20 MG capsule TAKE 1 CAPSULE EVERY DAY.  Marland Kitchen polyethylene glycol (MIRALAX / GLYCOLAX) packet Take 17 g by mouth daily as needed.   . [EXPIRED] saccharomyces  boulardii (FLORASTOR) 250 MG capsule Take 250 mg by mouth 2 (two) times daily.  . Simethicone 80 MG TABS Take 1 tablet by mouth 4 (four) times daily as needed.   No facility-administered encounter medications on file as of 07/30/2018.     Review of Systems  Constitutional: Positive for appetite change, fatigue and unexpected weight change. Negative for activity change, chills, diaphoresis and fever.  HENT: Positive for hearing loss. Negative for congestion and voice change.   Respiratory: Positive for shortness of breath. Negative for cough, chest tightness and wheezing.   Cardiovascular: Positive for leg swelling. Negative for chest pain and palpitations.  Gastrointestinal: Negative for abdominal distention, abdominal pain, constipation, diarrhea, nausea and vomiting.       Poor appetite  Genitourinary: Positive for flank pain. Negative for difficulty urinating, dysuria and urgency.  Musculoskeletal: Positive for back pain and gait problem.  Skin: Negative for color change and pallor.  Neurological: Negative for dizziness, speech difficulty and headaches.       Repetitive  Psychiatric/Behavioral: Negative for agitation, behavioral problems, confusion, hallucinations and sleep disturbance.  The patient is not nervous/anxious.     Immunization History  Administered Date(s) Administered  . Influenza Whole 09/22/2008, 09/16/2012, 09/30/2013  . Influenza, High Dose Seasonal PF 09/25/2017  . Influenza-Unspecified 10/02/2014, 09/15/2015, 09/27/2016  . Pneumococcal Polysaccharide-23 08/17/2006  . Td 12/17/2005   Pertinent  Health Maintenance Due  Topic Date Due  . FOOT EXAM  07/19/1931  . OPHTHALMOLOGY EXAM  07/19/1931  . PNA vac Low Risk Adult (2 of 2 - PCV13) 08/18/2007  . URINE MICROALBUMIN  04/30/2008  . HEMOGLOBIN A1C  10/17/2017  . INFLUENZA VACCINE  07/17/2018  . DEXA SCAN  Completed   Fall Risk  09/19/2017 05/17/2016 04/20/2016 04/19/2016  Falls in the past year? No No No No   Functional Status Survey:    Vitals:   07/30/18 1138  BP: 140/80  Pulse: 72  Resp: 18  Temp: 97.6 F (36.4 C)  SpO2: 90%  Weight: 141 lb 9.6 oz (64.2 kg)  Height: 5' 4"  (1.626 m)   Body mass index is 24.31 kg/m. Physical Exam  Constitutional: She appears well-developed and well-nourished.  HENT:  Head: Normocephalic and atraumatic.  Eyes: Pupils are equal, round, and reactive to light. EOM are normal.  Neck: Normal range of motion. Neck supple. No JVD present. No thyromegaly present.  Cardiovascular: Normal rate.  No murmur heard. Irregular heart beats.   Pulmonary/Chest: She has no wheezes. She has no rales.  O2 2lpm via [place]  Abdominal: Soft. Bowel sounds are normal. She exhibits no distension. There is no tenderness. There is no rebound and no guarding.  Musculoskeletal: She exhibits edema.  1+ edema BLE. Self transfer. Ambulates with walker for a short distance, power chair to go further.   Neurological: She is alert. No cranial nerve deficit. She exhibits normal muscle tone. Coordination normal.  Oriented to person and place.   Skin: Skin is warm and dry.  Psychiatric: She has a normal mood and affect. Her behavior is normal. Judgment and thought content normal.    Labs  reviewed: Recent Labs    11/05/17 0346  12/24/17 0700 01/16/18 0842  04/15/18 07/24/18 07/25/18 08/05/18  NA 135   < > 135 136   < > 134* 136*  --  138  K 3.9   < > 3.9 3.5   < > 4.1 2.6* 3.6 4.9  CL 109  --  97* 101  --  99  92  --  101  CO2 22  --  31 29  --  28 36  --  28  GLUCOSE 119*  --  79 99  --   --   --   --   --   BUN 34*   < > 15 18   < > 16 17  --  16  CREATININE 0.84   < > 0.78 0.70   < > 0.8 0.8  --  1.0  CALCIUM 8.2*  --  10.0 9.8  --  9.2 8.8  --  9.1   < > = values in this interval not displayed.   Recent Labs    08/21/17 0000 11/03/17 1818  12/24/17 0700 01/23/18 03/25/18 07/24/18  AST 16 20   < > 17  17 23 18 26   ALT 14 13*   < > 12  12 16 13 17   ALKPHOS  --  78   < >  --  217* 133* 105  BILITOT 0.3 0.4  --  0.4  0.4  --   --   --   PROT 7.0 6.5  --  7.2  7.2  --   --  6.5  ALBUMIN  --  3.1*  --   --   --   --  3.4   < > = values in this interval not displayed.   Recent Labs    11/15/17 0357  12/24/17 0700 01/16/18 0842  03/25/18 04/24/18 07/24/18  WBC 9.6   < > 6.5 10.3   < > 7.3 7.8 6.0  HGB 9.7*   < > 10.7* 11.0*   < > 9.3* 9.6* 10.4*  HCT 29.9*   < > 31.9* 32.7*   < > 29* 29* 30*  MCV 88.7  --  86.4 88.4  --   --   --   --   PLT 270   < > 389 278   < > 341 346 210   < > = values in this interval not displayed.   Lab Results  Component Value Date   TSH 2.00 11/20/2017   Lab Results  Component Value Date   HGBA1C 5.3 04/16/2017   Lab Results  Component Value Date   CHOL 188 04/12/2016   HDL 62 04/12/2016   LDLCALC 102 04/12/2016   TRIG 122 04/12/2016   CHOLHDL 3.0 Ratio 06/07/2008    Significant Diagnostic Results in last 30 days:  No results found.  Assessment/Plan Decrease in appetite #148Ibs 06/2018, # 141 Ibs 07/2018. C/o poor appetite. Will try Mirtazapine 7.14m po qhs, monitor weight weekly x 4, observe.   Pneumonia 07/23/18 CXR left mid and lower lung infiltrates, completed 7 day course of Levaquin today. She is afebrile.     Congestive heart failure (CHF) ([facility] 07/23/18 CXR venous pulmonary congestion, pending echocardiogram. She has edema 1+BLE. The patient desires delaying of Furosemide, POA was present during today's visit.   Hypokalemia 04/15/18 Na 134, K 4.1, Bun 16, creat 0.75 07/24/18 K 2.6, Kcl 443m qd, serum K in am 07/25/18 K 3.6, f/u BMP 1 week.    Pulmonary hypertension, primary ([facility]8/13/19 echocardiogram: mild mitral valve regurgitation, there is mild tricuspid valve regurgitation with severe pulmonary hypertension, normal LV function. 07/30/18 declined diuretics. B vs Risk explained to the patient and her POA.  08/08/18 Furosemide 2031mo daily if POA consents. F/u BMP one week.        Family/ staff Communication: plan of care reviewed with  the patient, the patient's POA, and charge nurse.   Labs/tests ordered:  BMP  Time spend 25 minutes.

## 2018-07-31 ENCOUNTER — Encounter: Payer: Self-pay | Admitting: Nurse Practitioner

## 2018-07-31 DIAGNOSIS — I270 Primary pulmonary hypertension: Secondary | ICD-10-CM | POA: Insufficient documentation

## 2018-07-31 NOTE — Assessment & Plan Note (Addendum)
07/29/18 echocardiogram: mild mitral valve regurgitation, there is mild tricuspid valve regurgitation with severe pulmonary hypertension, normal LV function. 07/30/18 declined diuretics. B vs Risk explained to the patient and her POA.  08/08/18 Furosemide 69m po daily if POA consents. F/u BMP one week.

## 2018-08-02 DIAGNOSIS — R69 Illness, unspecified: Secondary | ICD-10-CM | POA: Diagnosis not present

## 2018-08-05 DIAGNOSIS — E876 Hypokalemia: Secondary | ICD-10-CM | POA: Diagnosis not present

## 2018-08-05 LAB — BASIC METABOLIC PANEL
BUN: 16 (ref 4–21)
Creatinine: 1 (ref <=1.1)
GLUCOSE: 79
Potassium: 4.9 (ref 3.4–5.3)
SODIUM: 138 (ref 137–147)

## 2018-08-06 ENCOUNTER — Other Ambulatory Visit: Payer: Self-pay | Admitting: *Deleted

## 2018-08-06 LAB — BASIC METABOLIC PANEL
CALCIUM: 9.1
CHLORIDE: 101
CO2: 28
GFR CALC NON AF AMER: 51

## 2018-08-09 ENCOUNTER — Other Ambulatory Visit: Payer: Self-pay

## 2018-08-09 ENCOUNTER — Emergency Department (HOSPITAL_COMMUNITY): Payer: Medicare HMO

## 2018-08-09 ENCOUNTER — Observation Stay (HOSPITAL_COMMUNITY)
Admission: EM | Admit: 2018-08-09 | Discharge: 2018-08-12 | Disposition: A | Discharge status: Home / Self Care | Payer: Medicare HMO | Attending: Internal Medicine | Admitting: Internal Medicine

## 2018-08-09 ENCOUNTER — Encounter (HOSPITAL_COMMUNITY): Payer: Self-pay

## 2018-08-09 DIAGNOSIS — E119 Type 2 diabetes mellitus without complications: Secondary | ICD-10-CM | POA: Insufficient documentation

## 2018-08-09 DIAGNOSIS — I270 Primary pulmonary hypertension: Secondary | ICD-10-CM

## 2018-08-09 DIAGNOSIS — F418 Other specified anxiety disorders: Secondary | ICD-10-CM

## 2018-08-09 DIAGNOSIS — Z85118 Personal history of other malignant neoplasm of bronchus and lung: Secondary | ICD-10-CM | POA: Insufficient documentation

## 2018-08-09 DIAGNOSIS — R300 Dysuria: Secondary | ICD-10-CM | POA: Diagnosis present

## 2018-08-09 DIAGNOSIS — R4182 Altered mental status, unspecified: Secondary | ICD-10-CM | POA: Diagnosis not present

## 2018-08-09 DIAGNOSIS — Z79899 Other long term (current) drug therapy: Secondary | ICD-10-CM | POA: Diagnosis not present

## 2018-08-09 DIAGNOSIS — R2681 Unsteadiness on feet: Secondary | ICD-10-CM | POA: Insufficient documentation

## 2018-08-09 DIAGNOSIS — I272 Pulmonary hypertension, unspecified: Secondary | ICD-10-CM | POA: Insufficient documentation

## 2018-08-09 DIAGNOSIS — Z853 Personal history of malignant neoplasm of breast: Secondary | ICD-10-CM | POA: Insufficient documentation

## 2018-08-09 DIAGNOSIS — F329 Major depressive disorder, single episode, unspecified: Secondary | ICD-10-CM | POA: Diagnosis not present

## 2018-08-09 DIAGNOSIS — F419 Anxiety disorder, unspecified: Secondary | ICD-10-CM | POA: Insufficient documentation

## 2018-08-09 DIAGNOSIS — G9340 Encephalopathy, unspecified: Principal | ICD-10-CM | POA: Diagnosis present

## 2018-08-09 DIAGNOSIS — R0902 Hypoxemia: Secondary | ICD-10-CM

## 2018-08-09 DIAGNOSIS — R531 Weakness: Secondary | ICD-10-CM | POA: Diagnosis present

## 2018-08-09 DIAGNOSIS — S299XXA Unspecified injury of thorax, initial encounter: Secondary | ICD-10-CM | POA: Diagnosis not present

## 2018-08-09 DIAGNOSIS — R69 Illness, unspecified: Secondary | ICD-10-CM | POA: Diagnosis not present

## 2018-08-09 DIAGNOSIS — I160 Hypertensive urgency: Secondary | ICD-10-CM | POA: Diagnosis present

## 2018-08-09 DIAGNOSIS — I10 Essential (primary) hypertension: Secondary | ICD-10-CM | POA: Insufficient documentation

## 2018-08-09 DIAGNOSIS — R410 Disorientation, unspecified: Secondary | ICD-10-CM | POA: Diagnosis not present

## 2018-08-09 LAB — URINALYSIS, ROUTINE W REFLEX MICROSCOPIC
Bacteria, UA: NONE SEEN
Bilirubin Urine: NEGATIVE
Glucose, UA: NEGATIVE mg/dL
HGB URINE DIPSTICK: NEGATIVE
Ketones, ur: NEGATIVE mg/dL
NITRITE: NEGATIVE
PH: 8 (ref 5.0–8.0)
Protein, ur: NEGATIVE mg/dL
SPECIFIC GRAVITY, URINE: 1.004 — AB (ref 1.005–1.030)

## 2018-08-09 LAB — COMPREHENSIVE METABOLIC PANEL
ALK PHOS: 122 U/L (ref 38–126)
ALT: 34 U/L (ref 0–44)
AST: 45 U/L — ABNORMAL HIGH (ref 15–41)
Albumin: 3.8 g/dL (ref 3.5–5.0)
Anion gap: 9 (ref 5–15)
BILIRUBIN TOTAL: 1.2 mg/dL (ref 0.3–1.2)
BUN: 17 mg/dL (ref 8–23)
CALCIUM: 9.7 mg/dL (ref 8.9–10.3)
CO2: 29 mmol/L (ref 22–32)
Chloride: 102 mmol/L (ref 98–111)
Creatinine, Ser: 0.92 mg/dL (ref 0.44–1.00)
GFR calc Af Amer: 59 mL/min — ABNORMAL LOW (ref >60)
GFR, EST NON AFRICAN AMERICAN: 51 mL/min — AB (ref >60)
Glucose, Bld: 101 mg/dL — ABNORMAL HIGH (ref 70–99)
Potassium: 5.1 mmol/L (ref 3.5–5.1)
Sodium: 140 mmol/L (ref 135–145)
Total Protein: 8.3 g/dL — ABNORMAL HIGH (ref 6.5–8.1)

## 2018-08-09 LAB — CBC WITH DIFFERENTIAL/PLATELET
Basophils Absolute: 0 10*3/uL (ref 0.0–0.1)
Basophils Relative: 0 %
EOS PCT: 1 %
Eosinophils Absolute: 0.1 10*3/uL (ref 0.0–0.7)
HEMATOCRIT: 36.5 % (ref 36.0–46.0)
HEMOGLOBIN: 12.2 g/dL (ref 12.0–15.0)
LYMPHS ABS: 0.6 10*3/uL — AB (ref 0.7–4.0)
LYMPHS PCT: 8 %
MCH: 31 pg (ref 26.0–34.0)
MCHC: 33.4 g/dL (ref 30.0–36.0)
MCV: 92.9 fL (ref 78.0–100.0)
Monocytes Absolute: 0.6 10*3/uL (ref 0.1–1.0)
Monocytes Relative: 9 %
NEUTROS ABS: 5.8 10*3/uL (ref 1.7–7.7)
NEUTROS PCT: 82 %
Platelets: 297 10*3/uL (ref 150–400)
RBC: 3.93 MIL/uL (ref 3.87–5.11)
RDW: 18.7 % — ABNORMAL HIGH (ref 11.5–15.5)
WBC: 7.1 10*3/uL (ref 4.0–10.5)

## 2018-08-09 LAB — I-STAT TROPONIN, ED: Troponin i, poc: 0.04 ng/mL (ref 0.00–0.08)

## 2018-08-09 MED ORDER — MIRTAZAPINE 15 MG PO TABS
7.5000 mg | ORAL_TABLET | Freq: Every day | ORAL | Status: DC
  Administered 2018-08-10 (×2): 7.5 mg via ORAL
  Filled 2018-08-09 (×2): qty 1

## 2018-08-09 MED ORDER — SACCHAROMYCES BOULARDII 250 MG PO CAPS
250.0000 mg | ORAL_CAPSULE | Freq: Every day | ORAL | Status: DC
  Administered 2018-08-10 – 2018-08-12 (×2): 250 mg via ORAL
  Filled 2018-08-09 (×3): qty 1

## 2018-08-09 MED ORDER — IPRATROPIUM-ALBUTEROL 0.5-2.5 (3) MG/3ML IN SOLN: 3.0000 mL | Freq: Four times a day (QID) | RESPIRATORY_TRACT | Status: DC | PRN

## 2018-08-09 MED ORDER — FUROSEMIDE 10 MG/ML IJ SOLN
40.0000 mg | Freq: Once | INTRAMUSCULAR | Status: AC
  Administered 2018-08-09: 40 mg via INTRAVENOUS
  Filled 2018-08-09: qty 4

## 2018-08-09 MED ORDER — PANTOPRAZOLE SODIUM 40 MG PO TBEC
40.0000 mg | DELAYED_RELEASE_TABLET | Freq: Every day | ORAL | Status: DC
  Administered 2018-08-10 – 2018-08-12 (×2): 40 mg via ORAL
  Filled 2018-08-09 (×3): qty 1

## 2018-08-09 MED ORDER — ACETAMINOPHEN 650 MG RE SUPP: 650.0000 mg | Freq: Four times a day (QID) | RECTAL | Status: DC | PRN

## 2018-08-09 MED ORDER — SODIUM CHLORIDE 0.9 % IV SOLN
2.0000 g | Freq: Every day | INTRAVENOUS | Status: DC
  Administered 2018-08-10 (×2): 2 g via INTRAVENOUS
  Filled 2018-08-09 (×2): qty 2

## 2018-08-09 MED ORDER — ACETAMINOPHEN 325 MG PO TABS: 650.0000 mg | ORAL_TABLET | Freq: Four times a day (QID) | ORAL | Status: DC | PRN

## 2018-08-09 MED ORDER — SODIUM CHLORIDE 0.9% FLUSH
3.0000 mL | Freq: Two times a day (BID) | INTRAVENOUS | Status: DC
  Administered 2018-08-10: 3 mL via INTRAVENOUS

## 2018-08-09 MED ORDER — ONDANSETRON HCL 4 MG PO TABS: 4.0000 mg | ORAL_TABLET | Freq: Four times a day (QID) | ORAL | Status: DC | PRN

## 2018-08-09 MED ORDER — GABAPENTIN 100 MG PO CAPS
100.0000 mg | ORAL_CAPSULE | Freq: Two times a day (BID) | ORAL | Status: DC
  Administered 2018-08-10 – 2018-08-12 (×5): 100 mg via ORAL
  Filled 2018-08-09 (×5): qty 1

## 2018-08-09 MED ORDER — LORAZEPAM 2 MG/ML IJ SOLN
1.0000 mg | Freq: Once | INTRAMUSCULAR | Status: AC
  Administered 2018-08-09: 1 mg via INTRAVENOUS
  Filled 2018-08-09: qty 1

## 2018-08-09 MED ORDER — FOLIC ACID 1 MG PO TABS
500.0000 ug | ORAL_TABLET | Freq: Every day | ORAL | Status: DC
  Administered 2018-08-10 – 2018-08-12 (×2): 0.5 mg via ORAL
  Filled 2018-08-09 (×3): qty 1

## 2018-08-09 MED ORDER — LATANOPROST 0.005 % OP SOLN
1.0000 [drp] | Freq: Every day | OPHTHALMIC | Status: DC
  Administered 2018-08-10 – 2018-08-11 (×3): 1 [drp] via OPHTHALMIC
  Filled 2018-08-09: qty 2.5

## 2018-08-09 MED ORDER — LEVOFLOXACIN IN D5W 500 MG/100ML IV SOLN
500.0000 mg | Freq: Once | INTRAVENOUS | Status: AC
  Administered 2018-08-09: 500 mg via INTRAVENOUS
  Filled 2018-08-09: qty 100

## 2018-08-09 MED ORDER — HALOPERIDOL LACTATE 5 MG/ML IJ SOLN: 1.0000 mg | Freq: Four times a day (QID) | INTRAMUSCULAR | Status: DC | PRN

## 2018-08-09 MED ORDER — B COMPLEX-C PO TABS
1.0000 | ORAL_TABLET | Freq: Every day | ORAL | Status: DC
  Administered 2018-08-10 – 2018-08-12 (×2): 1 via ORAL
  Filled 2018-08-09 (×3): qty 1

## 2018-08-09 MED ORDER — DULOXETINE HCL 30 MG PO CPEP
30.0000 mg | ORAL_CAPSULE | Freq: Every day | ORAL | Status: DC
  Administered 2018-08-10 – 2018-08-12 (×3): 30 mg via ORAL
  Filled 2018-08-09 (×3): qty 1

## 2018-08-09 MED ORDER — FUROSEMIDE 40 MG PO TABS
20.0000 mg | ORAL_TABLET | Freq: Every day | ORAL | Status: DC
  Administered 2018-08-10 – 2018-08-11 (×2): 20 mg via ORAL
  Filled 2018-08-09 (×2): qty 1

## 2018-08-09 MED ORDER — SIMETHICONE 80 MG PO CHEW: 80.0000 mg | CHEWABLE_TABLET | Freq: Four times a day (QID) | ORAL | Status: DC | PRN

## 2018-08-09 MED ORDER — POLYETHYLENE GLYCOL 3350 17 G PO PACK: 17.0000 g | PACK | Freq: Every day | ORAL | Status: DC | PRN

## 2018-08-09 MED ORDER — HYDRALAZINE HCL 20 MG/ML IJ SOLN: 10.0000 mg | INTRAMUSCULAR | Status: DC | PRN

## 2018-08-09 MED ORDER — DIPHENHYDRAMINE HCL 50 MG/ML IJ SOLN
12.5000 mg | Freq: Once | INTRAMUSCULAR | Status: AC
  Administered 2018-08-09: 12.5 mg via INTRAVENOUS
  Filled 2018-08-09: qty 1

## 2018-08-09 MED ORDER — ONDANSETRON HCL 4 MG/2ML IJ SOLN: 4.0000 mg | Freq: Four times a day (QID) | INTRAMUSCULAR | Status: DC | PRN

## 2018-08-09 MED ORDER — ATENOLOL 25 MG PO TABS
25.0000 mg | ORAL_TABLET | Freq: Every day | ORAL | Status: DC
  Administered 2018-08-10 – 2018-08-11 (×2): 25 mg via ORAL
  Filled 2018-08-09 (×2): qty 1

## 2018-08-09 MED ORDER — SODIUM CHLORIDE 0.9% FLUSH: 3.0000 mL | INTRAVENOUS | Status: DC | PRN

## 2018-08-09 MED ORDER — FENTANYL CITRATE (PF) 100 MCG/2ML IJ SOLN
12.5000 ug | INTRAMUSCULAR | Status: DC | PRN
  Administered 2018-08-10: 12.5 ug via INTRAVENOUS
  Filled 2018-08-09: qty 2

## 2018-08-09 MED ORDER — SODIUM CHLORIDE 0.9 % IV SOLN
250.0000 mL | INTRAVENOUS | Status: DC | PRN
  Administered 2018-08-10: 250 mL via INTRAVENOUS

## 2018-08-09 MED ORDER — LIDOCAINE 5 % EX PTCH
1.0000 | MEDICATED_PATCH | Freq: Every day | CUTANEOUS | Status: DC | PRN
  Filled 2018-08-09: qty 1

## 2018-08-09 NOTE — ED Provider Notes (Addendum)
[facility] DEPT Provider Note   CSN: [id number] Arrival date & time: 08/09/18  1351     History   Chief Complaint Chief Complaint  Patient presents with  . Weakness    HPI [name] is a 84 y.o. female.  84 year old female brought in by EMS from nursing facility, her 2 sons are at bedside.  Patient's son states that patient is confused today with garbled speech.  Son last talk to patient at 27 last night and everything seemed fine, later in the evening the patient seemed confused and was thought that there was a problem with her hearing aid batteries at the time.  Family went to visit patient today and patient was confused with garbled speech.  Family was told that patient had a fall today while going to the bathroom, no known details regarding the fall.  Patient is able to follow commands.  Family states patient was recently diagnosed with pneumonia, recently completed antibiotic treatment.  Also recent diagnosis of pulmonary hypertension and started on a fluid pill yesterday or today.  Family states patient is normally alert and oriented, states patient did her taxes this year and this is a definite change in her mental status.     Past Medical History:  Diagnosis Date  . Allergic rhinitis   . Anemia    NOS  . Anxiety   . Biceps tendon rupture    Bilateral  . Breast cancer ([place]) 2001   Left s/p lumpectomy and XRT   . Bronchiectasis ([place]) 2014   Noted on chest x-ray  . Complication of anesthesia    slow to wake up  . Cough 2014  . Depression   . Diverticulosis   . Elevated liver enzymes    Biopsy consistent with steatohepatitis  . Fundic gland polyps of stomach, benign   . GERD (gastroesophageal reflux disease) 06/2007   EGD Dr Gala Romney >sm HH, multiple fundic gland polyps  . Glaucoma 2013   both eyes  . Hemorrhoids   . Hiatal hernia   . Hx of radiation therapy 07/17/10 to 07/21/10   SRS LLL lung  . Hyperlipidemia   . Insomnia   . Low  back pain    scoliosis  . Lung cancer ([place]) 05/2010   Left 2011 - rad x 3  . Lymphedema    Left arm  . Osteoarthritis   . Osteopenia 10/25/2016  . Osteoporosis, senile   . Overactive bladder   . Pneumonia    RLL with sepsis  . PSVT (paroxysmal supraventricular tachycardia) ([place])   . Rheumatic fever    Age 5  . Right thyroid nodule   . Steatohepatitis    liver bx  . Type 2 diabetes mellitus [facility])     Patient Active Problem List   Diagnosis Date Noted  . Acute encephalopathy 08/09/2018  . Hypertensive urgency 08/09/2018  . Dysuria 08/09/2018  . Pulmonary hypertension, primary ([place]) 07/31/2018  . Pneumonia 07/24/2018  . Congestive heart failure (CHF) ([place]) 07/24/2018  . Yeast vaginitis 07/23/2018  . Chronic respiratory failure with hypoxia ([place]) 06/20/2018  . Neuropathy 06/20/2018  . Paroxysmal atrial fibrillation ([place]) 06/20/2018  . Contusion of foot including toes, right, initial encounter 06/02/2018  . Left lower lobe pneumonia ([place]) 03/31/2018  . Hypokalemia 03/26/2018  . Bloated abdomen 03/21/2018  . Post-nasal drip 01/21/2018  . Iron deficiency 01/21/2018  . Fall 01/09/2018  . Coccyalgia 01/09/2018  . History of GI diverticular bleed 12/25/2017  . Protein-calorie malnutrition ([place])  12/05/2017  . Advanced care planning/counseling discussion 11/20/2017  . Unsteady gait 11/19/2017  . TSH elevation 11/19/2017  . Permanent atrial fibrillation ([place]) 11/19/2017  . Melena   . Diverticulosis of colon with hemorrhage   . Hematochezia 11/03/2017  . Decrease in appetite 04/03/2017  . Generalized weakness 04/03/2017  . Osteopenia 10/25/2016  . Hyponatremia 07/09/2015  . Hearing loss of both ears 04/08/2014  . HTN (hypertension) 03/27/2014  . Constipation 03/27/2014  . Skin lesion of face 03/27/2014  . Bronchiectasis ([place])   . Rotator cuff syndrome of right shoulder 09/30/2012  . Breast cancer ([place])   . Hx of radiation therapy   . Lung cancer, lower lobe ([place])  10/25/2011  . PSVT (paroxysmal supraventricular tachycardia) ([place]) 03/29/2011  . HIP PAIN 05/03/2010  . TOTAL HIP FOLLOW-UP 05/03/2010  . INTERDIGITAL NEUROMA 03/28/2010  . IMPINGEMENT SYNDROME 03/28/2010  . THYROID NODULE, RIGHT 09/06/2008  . HIP, ARTHRITIS, DEGEN./OSTEO 05/03/2008  . LEG EDEMA, BILATERAL 12/22/2007  . PALPITATIONS, RECURRENT 08/27/2007  . Depression with anxiety 07/31/2007  . WEAKNESS, MUSCLE 05/28/2007  . Other specified disorders of adrenal gland ([place]) 05/14/2007  . SOB (shortness of breath) 05/06/2007  . Fatigue 05/01/2007  . Hyperlipidemia 01/28/2007  . Blood loss anemia 01/28/2007  . Allergic rhinitis 01/28/2007  . OVERACTIVE BLADDER 01/28/2007  . Osteoarthritis 01/28/2007  . Pain in lower back 01/28/2007  . Osteoporosis 01/28/2007    Past Surgical History:  Procedure Laterality Date  . ABDOMINAL HYSTERECTOMY  1964  . APPENDECTOMY  1964  . BIOPSY THYROID  11/2008  . BREAST BIOPSY     X 3 w/ cystectomy  . BREAST LUMPECTOMY  2001  . CATARACT EXTRACTION Bilateral 2003   Lens implant Dr. Charise Killian  . CHOLECYSTECTOMY  1965  . COLONOSCOPY  09/11/2011   Procedure: COLONOSCOPY;  Surgeon: Dorothyann Peng, MD;  [facility];  Service: Endoscopy;  Laterality: N/A;  . COLONOSCOPY  2005 /2012   Dr. Lucianne Muss- diverticulosis, ext hemorrhoids.  . COLONOSCOPY WITH PROPOFOL N/A 11/09/2017   Procedure: COLONOSCOPY WITH PROPOFOL;  Surgeon: Mauri Pole, MD;  Location: [facility];  Service: Endoscopy;  Laterality: N/A;  . COLONOSCOPY WITH PROPOFOL N/A 11/12/2017   Procedure: COLONOSCOPY WITH PROPOFOL;  Surgeon: Ladene Artist, MD;  Location: [facility];  Service: Endoscopy;  Laterality: N/A;  . CYSTOSCOPY  2007  . ESOPHAGOGASTRODUODENOSCOPY  09/11/2011   Procedure: ESOPHAGOGASTRODUODENOSCOPY (EGD);  Surgeon: Dorothyann Peng, MD;  [facility];  Service: Endoscopy;  Laterality: N/A;  . ESOPHAGOGASTRODUODENOSCOPY (EGD) WITH PROPOFOL N/A 11/08/2017    Procedure: ESOPHAGOGASTRODUODENOSCOPY (EGD) WITH PROPOFOL;  Surgeon: Mauri Pole, MD;  Location[facility];  Service: Endoscopy;  Laterality: N/A;  . LIVER BIOPSY  2008  . LUNG BIOPSY Left 06/06/2010  . REVISION TOTAL HIP ARTHROPLASTY  2007   Left  . SKIN CANCER EXCISION     nose and left lower cheek  . TONSILLECTOMY  1930  . TOTAL HIP ARTHROPLASTY Bilateral 2005 & 2007    Dr. Earl Lagos. Aline Brochure      OB History   None      Home Medications    Prior to Admission medications   Medication Sig Start Date End Date Taking? Authorizing Provider  acetaminophen (TYLENOL) 325 MG tablet Take 650 mg by mouth every 6 (six) hours as needed for mild pain.    Yes [provider]  atenolol (TENORMIN) 25 MG tablet Take 25 mg by mouth daily.   Yes [provider]  B Complex Vitamins (B COMPLEX-B12) TABS Take 1 tablet by mouth daily.   Yes [provider]  cetirizine (ZYRTEC) 10 MG tablet Take 10 mg by mouth daily.   Yes [provider]  DULoxetine (CYMBALTA) 30 MG capsule Take 30 mg by mouth daily. 07/29/18  Yes [provider]  ferrous gluconate (FERGON) 240 (27 FE) MG tablet Take 240 mg by mouth daily.   Yes [provider]  folic acid (FOLVITE) 408 MCG tablet Take 400 mcg by mouth daily.   Yes [provider]  furosemide (LASIX) 20 MG tablet Take 20 mg by mouth daily. 08/09/18  Yes [provider]  gabapentin (NEURONTIN) 100 MG capsule Take 100 mg by mouth 2 (two) times daily.   Yes [provider]  hydrocortisone 2.5 % ointment Apply 1 application topically daily as needed.    Yes [provider]  ipratropium-albuterol (DUONEB) 0.5-2.5 (3) MG/3ML SOLN Take 3 mLs by nebulization every 6 (six) hours as needed. 07/28/18 08/09/18 Yes [provider]  latanoprost (XALATAN) 0.005 % ophthalmic solution Place 1 drop into both eyes at bedtime.   Yes [provider]  lidocaine (LIDODERM) 5  % Place 1 patch onto the skin daily as needed. Remove & Discard patch within 12 hours or as directed by MD    Yes [provider]  mirtazapine (REMERON) 15 MG tablet Take 7.5 mg by mouth at bedtime. 07/30/18  Yes [provider]  Multiple Vitamins-Minerals (PRESERVISION AREDS 2 PO) Take 1 capsule by mouth 2 (two) times daily.    Yes [provider]  omeprazole (PRILOSEC) 20 MG capsule TAKE 1 CAPSULE EVERY DAY. 07/05/17  Yes Pandey, Mahima, MD  polyethylene glycol (MIRALAX / GLYCOLAX) packet Take 17 g by mouth daily as needed.    Yes [provider]  potassium chloride SA (K-DUR,KLOR-CON) 20 MEQ tablet Take 40 mEq by mouth daily.  07/24/18  Yes [provider]  saccharomyces boulardii (FLORASTOR) 250 MG capsule Take 250 mg by mouth as needed (same day as any Antibiotic).   Yes [provider]  Simethicone 80 MG TABS Take 1 tablet by mouth 4 (four) times daily as needed.   Yes [provider]    Family History Family History  Problem Relation Age of Onset  . Heart failure Mother   . Osteoporosis Mother   . Hypertension Father   . Stroke Father   . Heart failure Father   . Leukemia Brother   . Heart failure Brother   . Cancer Maternal Grandmother   . Stroke Maternal Grandfather   . Cancer Unknown     Social History Social History   Tobacco Use  . Smoking status: Never Smoker  . Smokeless tobacco: Never Used  . Tobacco comment: 2nd hand-husband  Substance Use Topics  . Alcohol use: No  . Drug use: No     Allergies   Augmentin [amoxicillin-pot clavulanate]; Ibuprofen; Naproxen; Statins; Surgilube [gyne-moistrin]; and Tape   Review of Systems Review of Systems  Unable to perform ROS: Mental status change     Physical Exam Updated Vital Signs BP (!) 144/78 (BP Location: Left Arm)   Pulse 80   Temp 98.1 F (36.7 C) (Oral)   Resp 14   SpO2 90%   Physical Exam  Constitutional: She appears well-developed and  well-nourished. No distress.  HENT:  Head: Normocephalic and atraumatic.  Nose: Nose normal.  Mouth/Throat: Mucous membranes are dry.  Eyes: Pupils are equal, round, and reactive to  light. Conjunctivae are normal.  Neck: Normal range of motion. Neck supple.  Cardiovascular: Normal rate, regular rhythm, normal heart sounds and intact distal pulses.  No murmur heard. Pulmonary/Chest: Effort normal and breath sounds normal. No respiratory distress. She exhibits no tenderness.  Abdominal: Soft. Bowel sounds are normal. She exhibits no distension. There is no tenderness. There is no guarding.  Musculoskeletal: She exhibits no tenderness or deformity.  Neurological: She is alert. She has normal strength. GCS eye subscore is 4. GCS verbal subscore is 3. GCS motor subscore is 6.  Occasional clear speech, requests to use the bathroom. Otherwise unable to answer questions. Arm and leg strength equal.   Skin: Skin is warm and dry. No rash noted. She is not diaphoretic.  Nursing note and vitals reviewed.    ED Treatments / Results  Labs (all labs ordered are listed, but only abnormal results are displayed) Labs Reviewed  CBC WITH DIFFERENTIAL/PLATELET - Abnormal; Notable for the following components:      Result Value   RDW 18.7 (*)    Lymphs Abs 0.6 (*)    All other components within normal limits  COMPREHENSIVE METABOLIC PANEL - Abnormal; Notable for the following components:   Glucose, Bld 101 (*)    Total Protein 8.3 (*)    AST 45 (*)    GFR calc non Af Amer 51 (*)    GFR calc Af Amer 59 (*)    All other components within normal limits  URINALYSIS, ROUTINE W REFLEX MICROSCOPIC - Abnormal; Notable for the following components:   Color, Urine STRAW (*)    Specific Gravity, Urine 1.004 (*)    Leukocytes, UA LARGE (*)    All other components within normal limits  BASIC METABOLIC PANEL - Abnormal; Notable for the following components:   Chloride 97 (*)    GFR calc non Af Amer 47 (*)     GFR calc Af Amer 54 (*)    All other components within normal limits  URINE CULTURE  MRSA PCR SCREENING  TSH  AMMONIA  RPR  HIV ANTIBODY (ROUTINE TESTING)  I-STAT TROPONIN, ED  I-STAT TROPONIN, ED    EKG EKG Interpretation  Date/Time:  Saturday August 09 2018 16:19:06 EDT Ventricular Rate:  77 PR Interval:    QRS Duration: 92 QT Interval:  396 QTC Calculation: 449 R Axis:   -22 Text Interpretation:  Sinus rhythm Atrial premature complexes Left ventricular hypertrophy Confirmed by Fredia Sorrow [phone]) on 08/09/2018 4:21:55 PM   Radiology Ct Head Wo Contrast  Result Date: 08/09/2018 CLINICAL DATA:  Confusion. EXAM: CT HEAD WITHOUT CONTRAST TECHNIQUE: Contiguous axial images were obtained from the base of the skull through the vertex without intravenous contrast. COMPARISON:  None. FINDINGS: Brain: No subdural, epidural, or subarachnoid hemorrhage. Cerebellum, brainstem, and basal cisterns are normal. No mass effect or midline shift. Moderate white matter changes are noted. No acute cortical ischemia or infarct. No mass effect or midline shift. Ventricles and sulci are prominent but otherwise unremarkable. Vascular: No hyperdense vessel or unexpected calcification. Skull: Normal. Negative for fracture or focal lesion. Sinuses/Orbits: No acute finding. Other: None. IMPRESSION: Chronic white matter changes.  No acute abnormalities noted. Electronically Signed   By: Dorise Bullion III M.D   On: 08/09/2018 16:54   Mr Brain Wo Contrast  Result Date: 08/09/2018 CLINICAL DATA:  Altered mental status. Possible aphasia. Recent pneumonia. Decreased responsiveness. The examination had to be discontinued prior to completion due to patient noncompliance. EXAM: MRI HEAD WITHOUT CONTRAST  TECHNIQUE: Multiplanar, multiecho pulse sequences of the brain and surrounding structures were obtained without intravenous contrast. COMPARISON:  CT head 08/09/2018.  MRI brain 07/07/2007 FINDINGS: Brain: The  diffusion-weighted images demonstrate no acute or subacute infarction. Vascular: Not evaluated without T2 weighted imaging. Skull and upper cervical spine: Craniocervical junction is grossly normal sagittal T1 weighted images are severely degraded by patient motion. Sinuses/Orbits: Paranasal sinuses and mastoid air cells are within normal limits on the sagittal T1 weighted imaging. IMPRESSION: 1. Diffusion-weighted imaging demonstrates no acute or subacute infarction. 2. Motion degraded sagittal T1 weighted imaging was the only other sequence performed without any gross abnormality. Electronically Signed   By: San Morelle M.D.   On: 08/09/2018 21:02   Dg Chest Port 1 View  Result Date: 08/09/2018 CLINICAL DATA:  Weakness with disorientation and fall. History of lung and breast cancer. EXAM: PORTABLE CHEST 1 VIEW COMPARISON:  01/16/2018 FINDINGS: Increased or new densities at the left lung base are most compatible with pleural fluid with consolidation. Haziness in the right hilar region may represent vascular congestion. Cardiac silhouette appears to be enlargement but obscured by the left basilar chest disease. IMPRESSION: Left basilar chest densities. Findings are suggestive for a left pleural effusion with compressive atelectasis or consolidation. Left pleural effusion is probably small to moderate in size. Central vascular congestion. Electronically Signed   By: Markus Daft M.D.   On: 08/09/2018 15:20    Procedures Procedures (including critical care time)  Medications Ordered in ED Medications  fentaNYL (SUBLIMAZE) injection 12.5-25 mcg (has no administration in time range)  atenolol (TENORMIN) tablet 25 mg (has no administration in time range)  furosemide (LASIX) tablet 20 mg (has no administration in time range)  DULoxetine (CYMBALTA) DR capsule 30 mg (has no administration in time range)  mirtazapine (REMERON) tablet 7.5 mg (7.5 mg Oral Given 08/10/18 0030)  pantoprazole (PROTONIX) EC  tablet 40 mg (has no administration in time range)  polyethylene glycol (MIRALAX / GLYCOLAX) packet 17 g (has no administration in time range)  saccharomyces boulardii (FLORASTOR) capsule 250 mg (has no administration in time range)  simethicone (MYLICON) chewable tablet 80 mg (has no administration in time range)  folic acid (FOLVITE) tablet 0.5 mg (has no administration in time range)  gabapentin (NEURONTIN) capsule 100 mg (has no administration in time range)  B-complex with vitamin C tablet 1 tablet (has no administration in time range)  ipratropium-albuterol (DUONEB) 0.5-2.5 (3) MG/3ML nebulizer solution 3 mL (has no administration in time range)  latanoprost (XALATAN) 0.005 % ophthalmic solution 1 drop (1 drop Both Eyes Given 08/10/18 0037)  lidocaine (LIDODERM) 5 % 1 patch (has no administration in time range)  sodium chloride flush (NS) 0.9 % injection 3 mL (3 mLs Intravenous Given 08/10/18 0031)  sodium chloride flush (NS) 0.9 % injection 3 mL (has no administration in time range)  0.9 %  sodium chloride infusion ( Intravenous Rate/Dose Verify 08/10/18 0500)  acetaminophen (TYLENOL) tablet 650 mg (has no administration in time range)    Or  acetaminophen (TYLENOL) suppository 650 mg (has no administration in time range)  ondansetron (ZOFRAN) tablet 4 mg (has no administration in time range)    Or  ondansetron (ZOFRAN) injection 4 mg (has no administration in time range)  hydrALAZINE (APRESOLINE) injection 10 mg (has no administration in time range)  haloperidol lactate (HALDOL) injection 1 mg (has no administration in time range)  ceFEPIme (MAXIPIME) 2 g in sodium chloride 0.9 % 100 mL IVPB ( Intravenous Stopped 08/10/18  0126)  levofloxacin (LEVAQUIN) IVPB 500 mg (0 mg Intravenous Stopped 08/09/18 1906)  furosemide (LASIX) injection 40 mg (40 mg Intravenous Given 08/09/18 1653)  LORazepam (ATIVAN) injection 1 mg (1 mg Intravenous Given 08/09/18 1829)  diphenhydrAMINE (BENADRYL) injection  12.5 mg (12.5 mg Intravenous Given 08/09/18 2019)     Initial Impression / Assessment and Plan / ED Course  I have reviewed the triage vital signs and the nursing notes.  Pertinent labs & imaging results that were available during my care of the patient were reviewed by me and considered in my medical decision making (see chart for details).  Clinical Course as of Aug 11 643  Sat Aug 09, 2018  1548 84yo female brought in by EMS from nursing facility for altered mental status. Recently treated for PNA, recently diagnosed with pulmonary HTN and started on fluid pill, fell this morning while ambulating to the bathroom (unknown LOC). Able to follow commands, able to speak (vocalizes need to use bathroom), unable to answer questions. Sons at bedside. UA with large leukocytes, CBC unremarkable. CT head pending.   [LM]  3817 Care signed out to [place], [place] pending CT head. Ordered levaquin for lung and urine coverage. Plan is to admit for altered mental status, possible PNA, UTI.   [LM]  1602 Leukocytes, UA(!): LARGE [CG]  1603 WBC, UA: 11-20 [CG]  1603 Bacteria, UA: NONE SEEN [CG]  1603 IMPRESSION: Left basilar chest densities. Findings are suggestive for a left pleural effusion with compressive atelectasis or consolidation. Left pleural effusion is probably small to moderate in size.  Central vascular congestion.    DG Chest Port 1 View [CG]  2214 Re-evaluated pt.  Son at bedside states she has continued to endorse urge to urinate. Pt speech is slightly clearer.    [CG]    Clinical Course User Index [CG] Kinnie Feil, PA-C [LM] Tacy Learn, PA-C   Final Clinical Impressions(s) / ED Diagnoses   Final diagnoses:  Altered mental status, unspecified altered mental status type    ED Discharge Orders    None       Tacy Learn, PA-C 08/09/18 1558    Tacy Learn, PA-C 08/10/18 7116    Nat Christen, MD 08/10/18 [phone]

## 2018-08-09 NOTE — ED Notes (Addendum)
MRI at bedside. Patient is still restless. MRI to take patient in 30 minutes, new med orders to be obtained. Patient also placed on 2L [place]

## 2018-08-09 NOTE — Progress Notes (Signed)
Pharmacy Antibiotic Note  [name] is a 84 y.o. female with confusion admitted on 08/09/2018 with UTI.  Pharmacy has been consulted for cefepime dosing.  Plan: Cefepime 2 Gm IV q24h F/u scr/cultures     Temp (24hrs), Avg:98.5 F (36.9 C), Min:98.3 F (36.8 C), Max:98.6 F (37 C)  Recent Labs  Lab 08/05/18 08/09/18 1519  WBC  --  7.1  CREATININE 1.0 0.92    Estimated Creatinine Clearance: 31.6 mL/min (by C-G formula based on SCr of 0.92 mg/dL).    Allergies  Allergen Reactions  . Augmentin [Amoxicillin-Pot Clavulanate] Other (See Comments)    unknown  . Ibuprofen Hives  . Naproxen Other (See Comments)    unknown  . Statins Other (See Comments)    Feels like knives in stomach  . Surgilube [Gyne-Moistrin]     Rectal itching, burning  . Tape Rash    Antimicrobials this admission: 8/24 levaquin >> x1 ED 8/25 cefepime >>   Dose adjustments this admission:   Microbiology results:  BCx:   UCx:    Sputum:    MRSA PCR:  Thank you for allowing pharmacy to be a part of this patient's care.  Dorrene German 08/09/2018 11:34 PM

## 2018-08-09 NOTE — ED Notes (Signed)
Patient transported to MRI 

## 2018-08-09 NOTE — ED Triage Notes (Signed)
She was found to be "not as awake and responsive as usual" per her [facility] staff. They noted pt. To have had recent pneumonia and had finished her antibiotic a few days ago. They obtained a temperature there at 100.0. Pt. Arrives alert, confused as per her baseline and in no distress.

## 2018-08-09 NOTE — ED Notes (Signed)
Patient back from MRI.

## 2018-08-09 NOTE — ED Provider Notes (Signed)
1620: Patient handed off to me by previous ED PA who anticipates admission for altered mental status for possible UTI versus pneumonia.  At shift change CBC, BMP unremarkable.  UA with large leukocytes but negative nitrites, no bacteria seen and 11-20 WBC.  Chest x-ray shows left pleural effusion with atelectasis versus consolidation and central vascular congestion.  Patient completed Levaquin earlier this month and has had no fever or cough.  Pt's children at bedside state that she has been more short of breath and requiring PRN oxygen.  She has not been on Lasix up until today where she received her first oral dose.  Echocardiogram report at bedside confirms patient has normal LV function but severe pulmonary hypertension.  SOB likely secondary to vascular congestion/pulmonary hypertension.  MOST form at bedside indicates patient's wishes include limiting antibiotics and using cautiously.  Secondarily, patient to me appears to be aphasic.  She cannot tell me the name of a pen or the color of the piece of paper or where she is but she has normal strength bilaterally and follows commands well.  Given recent witnessed fall there is concern for intracranial injury versus stroke.  Per children pt ambulates on electric chair but usually conversational. LMK 630 pm. She completed her taxes on her own this year.   Physical Exam  BP (!) 166/93   Pulse 79   Temp 98.6 F (37 C) (Oral)   Resp 18   SpO2 100%   Physical Exam  Constitutional: She appears well-developed and well-nourished.  Awake. NAD  HENT:  Head: Normocephalic and atraumatic.  Right Ear: External ear normal.  Left Ear: External ear normal.  Nose: Nose normal.  Dry lips and MM   Eyes: Conjunctivae and EOM are normal.  Neck: Normal range of motion.  Cardiovascular: Normal rate, regular rhythm and normal heart sounds.  2+ pitting edema to above knees bilaterally  Pulmonary/Chest: Effort normal.  Diminished lung sounds to lower lobes  bilaterally. Normal SpO2 on RA.   Abdominal: Soft. There is tenderness.  Mild diffuse lower abd tenderness. No G/R/R. Active BS in lower quadrants.   Musculoskeletal: Normal range of motion. She exhibits no deformity.  Neurological: She is alert.  Alert and oriented to self only.  Becomes frustrated when she tries to answer other questions.  Expressive aphasia. Strength 5/5 with hand grip and ankle F/E.   Unable to determine sensation to light touch intact in hands and feet. CN VII facial nerve movements intact, symmetric, bilaterally CN VIII not tested CN IX, X no uvula deviation, symmetric soft palate/uvula rise CN XI 5/5 SCM and trapezius strength bilaterally  CN XII Tongue midline with symmetric L/R movement  Skin: Skin is warm and dry. Capillary refill takes less than 2 seconds.  Psychiatric: She has a normal mood and affect. Her behavior is normal. Judgment and thought content normal.  Nursing note and vitals reviewed.   ED Course/Procedures   Clinical Course as of Aug 10 2215  Sat Aug 09, 2018  1548 84yo female brought in by EMS from nursing facility for altered mental status. Recently treated for PNA, recently diagnosed with pulmonary HTN and started on fluid pill, fell this morning while ambulating to the bathroom (unknown LOC). Able to follow commands, able to speak (vocalizes need to use bathroom), unable to answer questions. Sons at bedside. UA with large leukocytes, CBC unremarkable. CT head pending.   [LM]  5093 Care signed out to [place], [place] pending CT head. Ordered levaquin for lung and urine  coverage. Plan is to admit for altered mental status, possible PNA, UTI.   [LM]  1602 Leukocytes, UA(!): LARGE [CG]  1603 WBC, UA: 11-20 [CG]  1603 Bacteria, UA: NONE SEEN [CG]  1603 IMPRESSION: Left basilar chest densities. Findings are suggestive for a left pleural effusion with compressive atelectasis or consolidation. Left pleural effusion is probably small to moderate in  size.  Central vascular congestion.    DG Chest Port 1 View [CG]  2214 Re-evaluated pt.  Son at bedside states she has continued to endorse urge to urinate. Pt speech is slightly clearer.    [CG]    Clinical Course User Index [CG] Kinnie Feil, PA-C [LM] Tacy Learn, PA-C    Procedures  MDM   1800: Discussed CT head results with family at bedside.  I discussed we recommend admission for altered mental status.  AMS may be secondary to stroke versus pulmonary hypertension leading to dyspnea and confusion versus less likely infection.  At this time, I discussed with family that we do not know the exact etiology but given her sudden changes in speech/aphasia there is concern for possible stroke.  MRI would be the study of choice.  Patient sons discussed amongst ourselves if they would agree to MRI and ultimately requested to go ahead and obtain MRI in the ER.  They both agree they are not interested in invasive interventions but would like to know if a stroke occurred.  MRI has been ordered.  2215: MRI negative. VSS. Unclear etiology of AMS.  Will discuss with hospitalist. Pt may benefit from observation.     Kinnie Feil, PA-C 08/09/18 2216    Fredia Sorrow, MD 08/17/18 [phone]

## 2018-08-09 NOTE — ED Notes (Signed)
Bed: EX51 Expected date:  Expected time:  Means of arrival:  Comments: 84 yo AMS

## 2018-08-09 NOTE — ED Provider Notes (Signed)
Medical screening examination/treatment/procedure(s) were conducted as a shared visit with non-physician practitioner(s) and myself.  I personally evaluated the patient during the encounter.  EKG Interpretation  Date/Time:  Saturday August 09 2018 16:19:06 EDT Ventricular Rate:  77 PR Interval:    QRS Duration: 92 QT Interval:  396 QTC Calculation: 449 R Axis:   -22 Text Interpretation:  Sinus rhythm Atrial premature complexes Left ventricular hypertrophy Confirmed by Fredia Sorrow [phone]) on 08/09/2018 4:21:55 PM   Patient seen by me along with the physician assistant.  Patient with speech problems today and confusion.  Last known normal was 1830 last night.  Patient is not very mobile but normally is very conversant.  This is a definite change.  Patient is a DNR.  Head CT is pending if does not give an answer the patient will get an MRI.  Patient's work-up otherwise no leukocytosis no fever chest x-ray essentially unchanged from the before no evidence of distinct pneumonia.  Patient definitely with some word salad can get like first part of a sentence sort of making sense second part of the sentence does not.  But patient able to form words are not slurred per se.  No obvious other focal deficit.   Fredia Sorrow, MD 08/09/18 [phone]

## 2018-08-09 NOTE — H&P (Signed)
History and Physical    [name] OVZ:[id number] DOB: 1921/06/06 DOA: 08/09/2018  PCP: Blanchie Serve, MD   Patient coming from:  [facility]   Chief Complaint: Confusion   HPI: [name] is a 84 y.o. female with medical history significant for pulmonary hypertension, hypertension, depression with anxiety, now presenting to the emergency department from her nursing facility for evaluation of confusion.  Patient is accompanied by her son who assist with the history.  She seemed to be in her usual state the evening of 08/08/2018 when he spoke with her on the phone.  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  Today, nursing facility staff reports that she was lethargic and confused.  She is complaining of urinary urgency and dysuria.  She reportedly fell at the nursing home today without any appreciable trauma and no loss of consciousness.  ED Course: Upon arrival to the ED, patient is found to be afebrile, saturating well on room air, and hypertensive to 190/100.  EKG features a sinus rhythm with PAC and LVH.  Chest x-ray is notable for a left basilar density with pleural effusion and compressive atelectasis versus pneumonia.  Noncontrast head CT is negative for acute intracranial abnormality.  MRI brain is negative for acute or subacute stroke.  Chemistry panel and CBC are unremarkable.  Troponin is normal.  Urinalysis features large leukocytes but no bacteria are seen.  Patient was given 40 mg of IV Lasix, Levaquin, 1 mg IV Ativan, and IV Benadryl in the ED.  She remains hemodynamically stable, but disoriented and appears to be in significant discomfort.  She will be observed for ongoing evaluation and management of acute encephalopathy, possibly secondary to UTI.  Review of Systems:  All other systems reviewed and apart from HPI, are negative.  Past Medical History:  Diagnosis Date    . Allergic rhinitis   . Anemia    NOS  . Anxiety   . Biceps tendon rupture    Bilateral  . Breast cancer ([place]) 2001   Left s/p lumpectomy and XRT   . Bronchiectasis ([facility]) 2014   Noted on chest x-ray  . Complication of anesthesia    slow to wake up  . Cough 2014  . Depression   . Diverticulosis   . Elevated liver enzymes    Biopsy consistent with steatohepatitis  . Fundic gland polyps of stomach, benign   . GERD (gastroesophageal reflux disease) 06/2007   EGD Dr Gala Romney >sm HH, multiple fundic gland polyps  . Glaucoma 2013   both eyes  . Hemorrhoids   . Hiatal hernia   . Hx of radiation therapy 07/17/10 to 07/21/10   SRS LLL lung  . Hyperlipidemia   . Insomnia   . Low back pain    scoliosis  . Lung cancer ([facility]) 05/2010   Left 2011 - rad x 3  . Lymphedema    Left arm  . Osteoarthritis   . Osteopenia 10/25/2016  . Osteoporosis, senile   . Overactive bladder   . Pneumonia    RLL with sepsis  . PSVT (paroxysmal supraventricular tachycardia) ([place])   . Rheumatic fever    Age 64  . Right thyroid nodule   . Steatohepatitis    liver bx  . Type 2 diabetes mellitus ([place])     Past Surgical History:  Procedure Laterality Date  . ABDOMINAL HYSTERECTOMY  1964  .  APPENDECTOMY  1964  . BIOPSY THYROID  11/2008  . BREAST BIOPSY     X 3 w/ cystectomy  . BREAST LUMPECTOMY  2001  . CATARACT EXTRACTION Bilateral 2003   Lens implant Dr. Charise Killian  . CHOLECYSTECTOMY  1965  . COLONOSCOPY  09/11/2011   Procedure: COLONOSCOPY;  Surgeon: Dorothyann Peng, MD;  Location: [facility];  Service: Endoscopy;  Laterality: N/A;  . COLONOSCOPY  2005 /2012   Dr. Lucianne Muss- diverticulosis, ext hemorrhoids.  . COLONOSCOPY WITH PROPOFOL N/A 11/09/2017   Procedure: COLONOSCOPY WITH PROPOFOL;  Surgeon: Mauri Pole, MD;  Location: [facility];  Service: Endoscopy;  Laterality: N/A;  . COLONOSCOPY WITH PROPOFOL N/A 11/12/2017   Procedure: COLONOSCOPY WITH PROPOFOL;  Surgeon: Ladene Artist, MD;   Location: [facility];  Service: Endoscopy;  Laterality: N/A;  . CYSTOSCOPY  2007  . ESOPHAGOGASTRODUODENOSCOPY  09/11/2011   Procedure: ESOPHAGOGASTRODUODENOSCOPY (EGD);  Surgeon: Dorothyann Peng, MD;  Location: [facility];  Service: Endoscopy;  Laterality: N/A;  . ESOPHAGOGASTRODUODENOSCOPY (EGD) WITH PROPOFOL N/A 11/08/2017   Procedure: ESOPHAGOGASTRODUODENOSCOPY (EGD) WITH PROPOFOL;  Surgeon: Mauri Pole, MD;  Location: [facility];  Service: Endoscopy;  Laterality: N/A;  . LIVER BIOPSY  2008  . LUNG BIOPSY Left 06/06/2010  . REVISION TOTAL HIP ARTHROPLASTY  2007   Left  . SKIN CANCER EXCISION     nose and left lower cheek  . TONSILLECTOMY  1930  . TOTAL HIP ARTHROPLASTY Bilateral 2005 & 2007    Dr. Earl Lagos. Aline Brochure      reports that she has never smoked. She has never used smokeless tobacco. She reports that she does not drink alcohol or use drugs.  Allergies  Allergen Reactions  . Augmentin [Amoxicillin-Pot Clavulanate] Other (See Comments)    unknown  . Ibuprofen Hives  . Naproxen Other (See Comments)    unknown  . Statins Other (See Comments)    Feels like knives in stomach  . Surgilube [Gyne-Moistrin]     Rectal itching, burning  . Tape Rash    Family History  Problem Relation Age of Onset  . Heart failure Mother   . Osteoporosis Mother   . Hypertension Father   . Stroke Father   . Heart failure Father   . Leukemia Brother   . Heart failure Brother   . Cancer Maternal Grandmother   . Stroke Maternal Grandfather   . Cancer Unknown      Prior to Admission medications   Medication Sig Start Date End Date Taking? Authorizing Provider  acetaminophen (TYLENOL) 325 MG tablet Take 650 mg by mouth every 6 (six) hours as needed for mild pain.    Yes [provider]  atenolol (TENORMIN) 25 MG tablet Take 25 mg by mouth daily.   Yes [provider]  B Complex Vitamins (B COMPLEX-B12) TABS Take 1 tablet by mouth daily.   Yes [provider]  cetirizine (ZYRTEC) 10 MG tablet Take 10 mg by mouth daily.   Yes [provider]  DULoxetine (CYMBALTA) 30 MG capsule Take 30 mg by mouth daily. 07/29/18  Yes [provider]  ferrous gluconate (FERGON) 240 (27 FE) MG tablet Take 240 mg by mouth daily.   Yes [provider]  folic acid (FOLVITE) 329 MCG tablet Take 400 mcg by mouth daily.   Yes [provider]  furosemide (LASIX) 20 MG tablet Take 20 mg by mouth daily. 08/09/18  Yes [provider]  gabapentin (NEURONTIN) 100 MG capsule  Take 100 mg by mouth 2 (two) times daily.   Yes [provider]  hydrocortisone 2.5 % ointment Apply 1 application topically daily as needed.    Yes [provider]  ipratropium-albuterol (DUONEB) 0.5-2.5 (3) MG/3ML SOLN Take 3 mLs by nebulization every 6 (six) hours as needed. 07/28/18 08/09/18 Yes [provider]  latanoprost (XALATAN) 0.005 % ophthalmic solution Place 1 drop into both eyes at bedtime.   Yes [provider]  lidocaine (LIDODERM) 5 % Place 1 patch onto the skin daily as needed. Remove & Discard patch within 12 hours or as directed by MD    Yes [provider]  mirtazapine (REMERON) 15 MG tablet Take 7.5 mg by mouth at bedtime. 07/30/18  Yes [provider]  Multiple Vitamins-Minerals (PRESERVISION AREDS 2 PO) Take 1 capsule by mouth 2 (two) times daily.    Yes [provider]  omeprazole (PRILOSEC) 20 MG capsule TAKE 1 CAPSULE EVERY DAY. 07/05/17  Yes Pandey, Mahima, MD  polyethylene glycol (MIRALAX / GLYCOLAX) packet Take 17 g by mouth daily as needed.    Yes [provider]  potassium chloride SA (K-DUR,KLOR-CON) 20 MEQ tablet Take 40 mEq by mouth daily.  07/24/18  Yes [provider]  saccharomyces boulardii (FLORASTOR) 250 MG capsule Take 250 mg by mouth as needed (same day as any Antibiotic).   Yes [provider]  Simethicone 80 MG TABS Take 1 tablet  by mouth 4 (four) times daily as needed.   Yes [provider]    Physical Exam: Vitals:   08/09/18 1613 08/09/18 1829 08/09/18 2000 08/09/18 2104  BP: (!) 180/98 (!) 174/89 (!) 155/101 (!) 166/93  Pulse: 74 75 69 79  Resp: 19 (!) 23 (!) 23 18  Temp:  98.6 F (37 C)    TempSrc:  Oral    SpO2: 100% 100% 98% 100%      Constitutional: No respiratory distress, in obvious discomfort  Eyes: PERTLA, lids and conjunctivae normal ENMT: Mucous membranes are moist. Posterior pharynx clear of any exudate or lesions.   Neck: normal, supple, no masses, no thyromegaly Respiratory: Diminished at bases, no wheezing, no crackles. Normal respiratory effort.    Cardiovascular: S1 & S2 heard, regular rate and rhythm. Trace pretibial edema bilaterally. No significant JVD. Abdomen: No distension, soft, suprapubic tenderness, no rebound pain or guarding. Bowel sounds active.  Musculoskeletal: no clubbing / cyanosis. No joint deformity upper and lower extremities.    Skin: no significant rashes, lesions, ulcers. Warm, dry, well-perfused. Neurologic: CN 2-12 grossly intact. Sensation intact. Strength 5/5 in all 4 limbs.  Psychiatric: Alert and oriented to self only. In apparent discomfort, cooperative.     Labs on Admission: I have personally reviewed following labs and imaging studies  CBC: Recent Labs  Lab 08/09/18 1519  WBC 7.1  NEUTROABS 5.8  HGB 12.2  HCT 36.5  MCV 92.9  PLT 224   Basic Metabolic Panel: Recent Labs  Lab 08/05/18 08/09/18 1519  NA 138 140  K 4.9 5.1  CL 101 102  CO2 28 29  GLUCOSE  --  101*  BUN 16 17  CREATININE 1.0 0.92  CALCIUM 9.1 9.7   GFR: Estimated Creatinine Clearance: 31.6 mL/min (by C-G formula based on SCr of 0.92 mg/dL). Liver Function Tests: Recent Labs  Lab 08/09/18 1519  AST 45*  ALT 34  ALKPHOS 122  BILITOT 1.2  PROT 8.3*  ALBUMIN 3.8   No results for input(s): LIPASE, AMYLASE in the  last 168 hours. No results for input(s):  AMMONIA in the last 168 hours. Coagulation Profile: No results for input(s): INR, PROTIME in the last 168 hours. Cardiac Enzymes: No results for input(s): CKTOTAL, CKMB, CKMBINDEX, TROPONINI in the last 168 hours. BNP (last 3 results) No results for input(s): PROBNP in the last 8760 hours. HbA1C: No results for input(s): HGBA1C in the last 72 hours. CBG: No results for input(s): GLUCAP in the last 168 hours. Lipid Profile: No results for input(s): CHOL, HDL, LDLCALC, TRIG, CHOLHDL, LDLDIRECT in the last 72 hours. Thyroid Function Tests: No results for input(s): TSH, T4TOTAL, FREET4, T3FREE, THYROIDAB in the last 72 hours. Anemia Panel: No results for input(s): VITAMINB12, FOLATE, FERRITIN, TIBC, IRON, RETICCTPCT in the last 72 hours. Urine analysis:    Component Value Date/Time   COLORURINE STRAW (A) 08/09/2018 1512   APPEARANCEUR CLEAR 08/09/2018 1512   LABSPEC 1.004 (L) 08/09/2018 1512   PHURINE 8.0 08/09/2018 1512   GLUCOSEU NEGATIVE 08/09/2018 1512   HGBUR NEGATIVE 08/09/2018 1512   BILIRUBINUR NEGATIVE 08/09/2018 1512   KETONESUR NEGATIVE 08/09/2018 1512   PROTEINUR NEGATIVE 08/09/2018 1512   NITRITE NEGATIVE 08/09/2018 1512   LEUKOCYTESUR LARGE (A) 08/09/2018 1512   Sepsis Labs: @LABRCNTIP (procalcitonin:4,lacticidven:4) )No results found for this or any previous visit (from the past 240 hour(s)).   Radiological Exams on Admission: Ct Head Wo Contrast  Result Date: 08/09/2018 CLINICAL DATA:  Confusion. EXAM: CT HEAD WITHOUT CONTRAST TECHNIQUE: Contiguous axial images were obtained from the base of the skull through the vertex without intravenous contrast. COMPARISON:  None. FINDINGS: Brain: No subdural, epidural, or subarachnoid hemorrhage. Cerebellum, brainstem, and basal cisterns are normal. No mass effect or midline shift. Moderate white matter changes are noted. No acute cortical ischemia or infarct. No mass effect or midline shift. Ventricles and sulci are prominent  but otherwise unremarkable. Vascular: No hyperdense vessel or unexpected calcification. Skull: Normal. Negative for fracture or focal lesion. Sinuses/Orbits: No acute finding. Other: None. IMPRESSION: Chronic white matter changes.  No acute abnormalities noted. Electronically Signed   By: Dorise Bullion III M.D   On: 08/09/2018 16:54   Mr Brain Wo Contrast  Result Date: 08/09/2018 CLINICAL DATA:  Altered mental status. Possible aphasia. Recent pneumonia. Decreased responsiveness. The examination had to be discontinued prior to completion due to patient noncompliance. EXAM: MRI HEAD WITHOUT CONTRAST TECHNIQUE: Multiplanar, multiecho pulse sequences of the brain and surrounding structures were obtained without intravenous contrast. COMPARISON:  CT head 08/09/2018.  MRI brain 07/07/2007 FINDINGS: Brain: The diffusion-weighted images demonstrate no acute or subacute infarction. Vascular: Not evaluated without T2 weighted imaging. Skull and upper cervical spine: Craniocervical junction is grossly normal sagittal T1 weighted images are severely degraded by patient motion. Sinuses/Orbits: Paranasal sinuses and mastoid air cells are within normal limits on the sagittal T1 weighted imaging. IMPRESSION: 1. Diffusion-weighted imaging demonstrates no acute or subacute infarction. 2. Motion degraded sagittal T1 weighted imaging was the only other sequence performed without any gross abnormality. Electronically Signed   By: San Morelle M.D.   On: 08/09/2018 21:02   Dg Chest Port 1 View  Result Date: 08/09/2018 CLINICAL DATA:  Weakness with disorientation and fall. History of lung and breast cancer. EXAM: PORTABLE CHEST 1 VIEW COMPARISON:  01/16/2018 FINDINGS: Increased or new densities at the left lung base are most compatible with pleural fluid with consolidation. Haziness in the right hilar region may represent vascular congestion. Cardiac silhouette appears to be enlargement but obscured by the left basilar  chest disease.  IMPRESSION: Left basilar chest densities. Findings are suggestive for a left pleural effusion with compressive atelectasis or consolidation. Left pleural effusion is probably small to moderate in size. Central vascular congestion. Electronically Signed   By: Markus Daft M.D.   On: 08/09/2018 15:20    EKG: Independently reviewed. Sinus rhythm, PAC, LVH.   Assessment/Plan   1. Acute encephalopathy  - Presents from nursing facility with confusion, apparent discomfort, and complaints of urinary urgency and dysuria  - CMP and CBC are unremarkable, CT head with no acute intracranial abnormality, and MRI brain with no acute or subacute infarction  - Infectious etiology possible; CXR with left basilar opacity that appears to be chronic and there is no cough, hypoxia, or apparent SOB; UA not convincing for UTI but she complains of dysuria and will be treated with antibiotic pending culture data   - She takes B12 and folate supplements daily, will check TSH, ammonia, and RPR  - Continue supportive care    2. UTI  - Patient continues to complain of urge to urinate and pain with urination  - UA with large leukocytes, but no bacteria seen on micro, and no Hgb or rbc  - No CVA tenderness or fever  - UA not convincing, but with acute encephalopathy of unknown etiology and dysuria, will treat empirically while following urine culture    3. Hypertension with hypertensive urgency  - BP 190/100 in ED in setting of pain and agitation  - Control pain and agitation, continue atenolol, use hydralazine IVP's prn   4. Pulmonary hypertension  - Recently started on Lasix 20 mg qD, will continue    5. Depression with anxiety  - Continue Cymbalta and Remeron     DVT prophylaxis: SCD's  Code Status: DNR  Family Communication: Son updated at bedside Consults called: None Admission status: Observation     Vianne Bulls, MD [facility] Pager [phone]  If 7PM-7AM, please contact  night-coverage [email or web address] Password [email or web address]  08/09/2018, 11:01 PM

## 2018-08-10 ENCOUNTER — Other Ambulatory Visit: Payer: Self-pay

## 2018-08-10 DIAGNOSIS — G9340 Encephalopathy, unspecified: Secondary | ICD-10-CM

## 2018-08-10 LAB — BASIC METABOLIC PANEL
ANION GAP: 15 (ref 5–15)
BUN: 17 mg/dL (ref 8–23)
CHLORIDE: 97 mmol/L — AB (ref 98–111)
CO2: 26 mmol/L (ref 22–32)
CREATININE: 0.99 mg/dL (ref 0.44–1.00)
Calcium: 9.8 mg/dL (ref 8.9–10.3)
GFR calc non Af Amer: 47 mL/min — ABNORMAL LOW (ref >60)
GFR, EST AFRICAN AMERICAN: 54 mL/min — AB (ref >60)
Glucose, Bld: 87 mg/dL (ref 70–99)
Potassium: 4.1 mmol/L (ref 3.5–5.1)
Sodium: 138 mmol/L (ref 135–145)

## 2018-08-10 LAB — URINE CULTURE

## 2018-08-10 LAB — MRSA PCR SCREENING: MRSA BY PCR: NEGATIVE

## 2018-08-10 LAB — TSH: TSH: 2.321 u[IU]/mL (ref 0.350–4.500)

## 2018-08-10 LAB — HIV ANTIBODY (ROUTINE TESTING W REFLEX): HIV Screen 4th Generation wRfx: NONREACTIVE

## 2018-08-10 LAB — RPR: RPR: NONREACTIVE

## 2018-08-10 LAB — AMMONIA: AMMONIA: 25 umol/L (ref 9–35)

## 2018-08-10 MED ORDER — SODIUM CHLORIDE 0.45 % IV SOLN
INTRAVENOUS | Status: DC
  Administered 2018-08-10 – 2018-08-12 (×4): via INTRAVENOUS

## 2018-08-10 NOTE — Progress Notes (Signed)
PROGRESS NOTE    [name]  TCY:[id number] DOB: 1921-06-29 DOA: 08/09/2018 PCP: Blanchie Serve, MD   Brief Narrative:84 y.o. female with medical history significant for pulmonary hypertension, hypertension, depression with anxiety, now presenting to the emergency department from her nursing facility for evaluation of confusion.  Patient is accompanied by her son who assist with the history.  She seemed to be in her usual state the evening of 08/08/2018 when he spoke with her on the phone.  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  Today, nursing facility staff reports that she was lethargic and confused.  She is complaining of urinary urgency and dysuria.  She reportedly fell at the nursing home today without any appreciable trauma and no loss of consciousness.  ED Course: Upon arrival to the ED, patient is found to be afebrile, saturating well on room air, and hypertensive to 190/100.  EKG features a sinus rhythm with PAC and LVH.  Chest x-ray is notable for a left basilar density with pleural effusion and compressive atelectasis versus pneumonia.  Noncontrast head CT is negative for acute intracranial abnormality.  MRI brain is negative for acute or subacute stroke.  Chemistry panel and CBC are unremarkable.  Troponin is normal.  Urinalysis features large leukocytes but no bacteria are seen.  Patient was given 40 mg of IV Lasix, Levaquin, 1 mg IV Ativan, and IV Benadryl in the ED.  She remains hemodynamically stable, but disoriented and appears to be in significant discomfort.  She will be observed for ongoing evaluation and management of acute encephalopathy, possibly secondary to UTI.  Assessment & Plan:   Principal Problem:   Acute encephalopathy Active Problems:   Depression with anxiety   HTN (hypertension)   Pulmonary hypertension, primary (HCC)   Hypertensive urgency    Dysuria  1. Acute encephalopathy  - Presents from nursing facility with confusion, apparent discomfort, and complaints of urinary urgency and dysuria  - CMP and CBC are unremarkable, CT head with no acute intracranial abnormality, and MRI brain with no acute or subacute infarction  - Infectious etiology possible; CXR with left basilar opacity that appears to be chronic and there is no cough, hypoxia, or apparent SOB;tsh normal, .ammonia level 25, - So far no source of infection has been identified. Urine culture negative dc antibiotics - Continue supportive care    2. UTI  - Patient continues to complain of urge to urinate and pain with urination  - UA with large leukocytes, but no bacteria seen on micro, and no Hgb or rbc  - No CVA tenderness or fever  - no leukocytosis Since  urine culture negative dc antibiotics  3. Hypertension with hypertensive urgency -better,continue antihypertensives   4. Pulmonary hypertension  - Recently started on Lasix 20 mg qD, will continue    5. Depression with anxiety  - Continue Cymbalta and Remeron     DVT prophylaxis:scd Code Status dnr Family Communication:no family available Disposition Plan: tbd Consultants:  none Procedures:none Antimicrobials:  none Subjective: Resting in bed pleasantly confused  Objective: Vitals:   08/09/18 2200 08/10/18 0018 08/10/18 0612 08/10/18 1237  BP: (!) 157/93 (!) 147/77 (!) 144/78 (!) 144/65  Pulse: 78 73 80 65  Resp: 16 15 14 16   Temp:  97.7 F (36.5 C) 98.1 F (36.7 C) 98 F (36.7 C)  TempSrc:  Oral Oral Oral  SpO2: (!) 83% 91% 90%  93%    Intake/Output Summary (Last 24 hours) at 08/10/2018 1251 Last data filed at 08/10/2018 0900 Gross per 24 hour  Intake 186.86 ml  Output -  Net 186.86 ml   There were no vitals filed for this visit.  Examination:  General exam: Appears calm and comfortable  Respiratory system: Clear to auscultation. Respiratory effort normal. Cardiovascular  system: S1 & S2 heard, RRR. No JVD, murmurs, rubs, gallops or clicks. No pedal edema. Gastrointestinal system: Abdomen is nondistended, soft and nontender. No organomegaly or masses felt. Normal bowel sounds heard Extremities: Symmetric 5 x 5 power. Skin: No rashes, lesions or ulcers    Data Reviewed: I have personally reviewed following labs and imaging studies  CBC: Recent Labs  Lab 08/09/18 1519  WBC 7.1  NEUTROABS 5.8  HGB 12.2  HCT 36.5  MCV 92.9  PLT 882   Basic Metabolic Panel: Recent Labs  Lab 08/05/18 08/09/18 1519 08/10/18 0053  NA 138 140 138  K 4.9 5.1 4.1  CL 101 102 97*  CO2 28 29 26   GLUCOSE  --  101* 87  BUN 16 17 17   CREATININE 1.0 0.92 0.99  CALCIUM 9.1 9.7 9.8   GFR: Estimated Creatinine Clearance: 29.4 mL/min (by C-G formula based on SCr of 0.99 mg/dL). Liver Function Tests: Recent Labs  Lab 08/09/18 1519  AST 45*  ALT 34  ALKPHOS 122  BILITOT 1.2  PROT 8.3*  ALBUMIN 3.8   No results for input(s): LIPASE, AMYLASE in the last 168 hours. Recent Labs  Lab 08/10/18 0053  AMMONIA 25   Coagulation Profile: No results for input(s): INR, PROTIME in the last 168 hours. Cardiac Enzymes: No results for input(s): CKTOTAL, CKMB, CKMBINDEX, TROPONINI in the last 168 hours. BNP (last 3 results) No results for input(s): PROBNP in the last 8760 hours. HbA1C: No results for input(s): HGBA1C in the last 72 hours. CBG: No results for input(s): GLUCAP in the last 168 hours. Lipid Profile: No results for input(s): CHOL, HDL, LDLCALC, TRIG, CHOLHDL, LDLDIRECT in the last 72 hours. Thyroid Function Tests: Recent Labs    08/10/18 0053  TSH 2.321   Anemia Panel: No results for input(s): VITAMINB12, FOLATE, FERRITIN, TIBC, IRON, RETICCTPCT in the last 72 hours. Sepsis Labs: No results for input(s): PROCALCITON, LATICACIDVEN in the last 168 hours.  Recent Results (from the past 240 hour(s))  MRSA PCR Screening     Status: None   Collection Time:  08/10/18  6:45 AM  Result Value Ref Range Status   MRSA by PCR NEGATIVE NEGATIVE Final    Comment:        The GeneXpert MRSA Assay (FDA approved for NASAL specimens only), is one component of a comprehensive MRSA colonization surveillance program. It is not intended to diagnose MRSA infection nor to guide or monitor treatment for MRSA infections. Performed at [facility], [place] [address] [place], [place] [address]          Radiology Studies: Ct Head Wo Contrast  Result Date: 08/09/2018 CLINICAL DATA:  Confusion. EXAM: CT HEAD WITHOUT CONTRAST TECHNIQUE: Contiguous axial images were obtained from the base of the skull through the vertex without intravenous contrast. COMPARISON:  None. FINDINGS: Brain: No subdural, epidural, or subarachnoid hemorrhage. Cerebellum, brainstem, and basal cisterns are normal. No mass effect or midline shift. Moderate white matter changes are noted. No acute cortical ischemia or infarct. No mass effect or midline shift. Ventricles and sulci are prominent but otherwise unremarkable. Vascular: No hyperdense vessel  or unexpected calcification. Skull: Normal. Negative for fracture or focal lesion. Sinuses/Orbits: No acute finding. Other: None. IMPRESSION: Chronic white matter changes.  No acute abnormalities noted. Electronically Signed   By: Dorise Bullion III M.D   On: 08/09/2018 16:54   Mr Brain Wo Contrast  Result Date: 08/09/2018 CLINICAL DATA:  Altered mental status. Possible aphasia. Recent pneumonia. Decreased responsiveness. The examination had to be discontinued prior to completion due to patient noncompliance. EXAM: MRI HEAD WITHOUT CONTRAST TECHNIQUE: Multiplanar, multiecho pulse sequences of the brain and surrounding structures were obtained without intravenous contrast. COMPARISON:  CT head 08/09/2018.  MRI brain 07/07/2007 FINDINGS: Brain: The diffusion-weighted images demonstrate no acute or subacute infarction. Vascular: Not  evaluated without T2 weighted imaging. Skull and upper cervical spine: Craniocervical junction is grossly normal sagittal T1 weighted images are severely degraded by patient motion. Sinuses/Orbits: Paranasal sinuses and mastoid air cells are within normal limits on the sagittal T1 weighted imaging. IMPRESSION: 1. Diffusion-weighted imaging demonstrates no acute or subacute infarction. 2. Motion degraded sagittal T1 weighted imaging was the only other sequence performed without any gross abnormality. Electronically Signed   By: San Morelle M.D.   On: 08/09/2018 21:02   Dg Chest Port 1 View  Result Date: 08/09/2018 CLINICAL DATA:  Weakness with disorientation and fall. History of lung and breast cancer. EXAM: PORTABLE CHEST 1 VIEW COMPARISON:  01/16/2018 FINDINGS: Increased or new densities at the left lung base are most compatible with pleural fluid with consolidation. Haziness in the right hilar region may represent vascular congestion. Cardiac silhouette appears to be enlargement but obscured by the left basilar chest disease. IMPRESSION: Left basilar chest densities. Findings are suggestive for a left pleural effusion with compressive atelectasis or consolidation. Left pleural effusion is probably small to moderate in size. Central vascular congestion. Electronically Signed   By: Markus Daft M.D.   On: 08/09/2018 15:20        Scheduled Meds: . atenolol  25 mg Oral Daily  . B-complex with vitamin C  1 tablet Oral Daily  . DULoxetine  30 mg Oral Daily  . folic acid  025 mcg Oral Daily  . furosemide  20 mg Oral Daily  . gabapentin  100 mg Oral BID  . latanoprost  1 drop Both Eyes QHS  . mirtazapine  7.5 mg Oral QHS  . pantoprazole  40 mg Oral Daily  . saccharomyces boulardii  250 mg Oral Daily  . sodium chloride flush  3 mL Intravenous Q12H   Continuous Infusions: . sodium chloride 10 mL/hr at 08/10/18 0500  . ceFEPime (MAXIPIME) IV Stopped (08/10/18 0126)     LOS: 0 days      Georgette Shell, MD [facility] If 7PM-7AM, please contact night-coverage [email or web address] Password [facility] 08/10/2018, 12:51 PM

## 2018-08-10 NOTE — Progress Notes (Signed)
Son at bedside requesting IVF due to poor oral intake. MD made aware and new orders written.

## 2018-08-11 ENCOUNTER — Observation Stay (HOSPITAL_BASED_OUTPATIENT_CLINIC_OR_DEPARTMENT_OTHER): Payer: Medicare HMO

## 2018-08-11 ENCOUNTER — Observation Stay (HOSPITAL_COMMUNITY): Payer: Medicare HMO

## 2018-08-11 DIAGNOSIS — I272 Pulmonary hypertension, unspecified: Secondary | ICD-10-CM | POA: Diagnosis not present

## 2018-08-11 DIAGNOSIS — R4182 Altered mental status, unspecified: Secondary | ICD-10-CM | POA: Diagnosis not present

## 2018-08-11 DIAGNOSIS — I5032 Chronic diastolic (congestive) heart failure: Secondary | ICD-10-CM | POA: Diagnosis not present

## 2018-08-11 DIAGNOSIS — I340 Nonrheumatic mitral (valve) insufficiency: Secondary | ICD-10-CM | POA: Diagnosis not present

## 2018-08-11 DIAGNOSIS — R0989 Other specified symptoms and signs involving the circulatory and respiratory systems: Secondary | ICD-10-CM | POA: Diagnosis not present

## 2018-08-11 DIAGNOSIS — G9340 Encephalopathy, unspecified: Secondary | ICD-10-CM | POA: Diagnosis not present

## 2018-08-11 DIAGNOSIS — R9431 Abnormal electrocardiogram [ECG] [EKG]: Secondary | ICD-10-CM | POA: Diagnosis not present

## 2018-08-11 LAB — ECHOCARDIOGRAM COMPLETE
Height: 64 in
Weight: 2175.99 oz

## 2018-08-11 MED ORDER — FUROSEMIDE 20 MG PO TABS: ORAL_TABLET | ORAL | 0 refills | Status: DC

## 2018-08-11 MED ORDER — ATENOLOL 25 MG PO TABS
25.0000 mg | ORAL_TABLET | Freq: Two times a day (BID) | ORAL | Status: DC
  Administered 2018-08-11 – 2018-08-12 (×2): 25 mg via ORAL
  Filled 2018-08-11 (×2): qty 1

## 2018-08-11 MED ORDER — ATENOLOL 25 MG PO TABS: 25.0000 mg | ORAL_TABLET | Freq: Two times a day (BID) | ORAL | 0 refills | Status: DC

## 2018-08-11 NOTE — Progress Notes (Signed)
CSW was made aware pt is resident of [facility]. Recently transitioned from the independent living apartment there. At baseline uses wheelchair and motorized wheelchair. Uses home oxygen. Met with family at bedside to gather care history above while pt sleeping. Spoke with [facility] contact as well. Will follow and assist with anticipated transition back to ALF at DC.  Sharren Bridge, MSW, LCSW Clinical Social Work 08/11/2018 [phone]

## 2018-08-11 NOTE — Progress Notes (Signed)
PROGRESS NOTE    [name]  BZJ:[id number] DOB: 1921-05-13 DOA: 08/09/2018 PCP: Blanchie Serve, MD   Brief Narrative: 84 y.o.femalewith medical history significant forpulmonary hypertension, hypertension, depression with anxiety, now presenting to the emergency department from her nursing facility for evaluation of confusion. Patient is accompanied by her son who assist with the history. She seemed to be in her usual state the evening of 08/08/2018 when he spoke with her on the phone. 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 Today, nursing facility staff reports that she was lethargic and confused. She is complaining of urinary urgency and dysuria. She reportedly fell at the nursing home today without any appreciable trauma and no loss of consciousness.  ED Course:Upon arrival to the ED, patient is found to be afebrile, saturating well on room air, and hypertensive to 190/100. EKG features a sinus rhythm with PAC and LVH. Chest x-ray is notable for a left basilar density with pleural effusion and compressive atelectasis versus pneumonia. Noncontrast head CT is negative for acute intracranial abnormality. MRI brain is negative for acute or subacute stroke. Chemistry panel and CBC are unremarkable. Troponin is normal. Urinalysis features large leukocytes but no bacteria are seen. Patient was given 40 mg of IV Lasix, Levaquin, 1 mg IV Ativan, and IV Benadryl in the ED. She remains hemodynamically stable, but disoriented and appears to be in significant discomfort. She will be observed for ongoing evaluation and management of acute encephalopathy, possibly secondary to UTI.  Assessment & Plan:   Principal Problem:   Acute encephalopathy Active Problems:   Depression with anxiety   HTN (hypertension)   Pulmonary hypertension, primary (HCC)   Hypertensive urgency  Dysuria  1] acute encephalopathy--secondary to dehydration, no infectious source identified yet.  Even though the initial UA showed large leukocytes urine culture showed no growth.  Cefepime is stopped today.  Upon discussion with patient's son who is the power of attorney his name is Leroy Sea he relates to multiple concerns about patient's care at the facility.  1 of the concern was patient was diagnosed with pulmonary hypertension as an outpatient and was started on Lasix on Saturday.  And Saturday she was admitted to the hospital.  [facility] if Lasix caused her to have dehydration.  But it looks like even if she had got Lasix she probably got just 1 dose of 20 mg Lasix.  The second concern he had was patient was started on Remeron 7.5 mg at bedtime 10 days prior to admission to the hospital.  This was started for depression decreased appetite and may be anxiety.  Wondering if Remeron had something to do with her change in mental status and fall.  Will DC Remeron and Lasix.  I have discussed with him about palliative care which he is not interested at this time.  He would like to get a cardiologist opinion on pulmonary hypertension.  Patient still with very minimal intake she takes very few bites there and here with all the meals.  Continue slow IV hydration.  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  2] hypertension blocker increase the dose to twice a day for better control.  3] depression continue Cymbalta DC Remeron.  DVT prophylaxis: scd Code Status: dnr Family Communication:dw son Disposition Plan: Consult patient most likely will need  to be discharged to skilled nursing facility with the [facility]. Consultants:  None Procedures: None Antimicrobials: Cefepime which will be stopped 08/11/2018 Subjective: Patient resting in bed appears very frail and weak pleasantly  confused   Objective: Vitals:   08/10/18 0612 08/10/18 1237 08/10/18 2150 08/11/18 0607  BP: (!) 144/78 (!) 144/65 (!) 176/89 (!) 165/72  Pulse: 80 65 (!) 55 66  Resp: 14 16 15 17   Temp: 98.1 F (36.7 C) 98 F (36.7 C) 100 F (37.8 C) 99.2 F (37.3 C)  TempSrc: Oral Oral Oral Oral  SpO2: 90% 93% 93% 93%  Weight:    61.7 kg    Intake/Output Summary (Last 24 hours) at 08/11/2018 1137 Last data filed at 08/11/2018 0845 Gross per 24 hour  Intake 953.1 ml  Output 800 ml  Net 153.1 ml   Filed Weights   08/11/18 0607  Weight: 61.7 kg    Examination: Oral mucosa dry  General exam: Appears calm and comfortable  Respiratory system: Clear to auscultation. Respiratory effort normal. Cardiovascular system: S1 & S2 heard, RRR. No JVD, murmurs, rubs, gallops or clicks. No pedal edema. Gastrointestinal system: Abdomen is nondistended, soft and nontender. No organomegaly or masses felt. Normal bowel sounds heard. Extremities: Symmetric 5 x 5 power. Skin: No rashes, lesions or ulcers    Data Reviewed: I have personally reviewed following labs and imaging studies  CBC: Recent Labs  Lab 08/09/18 1519  WBC 7.1  NEUTROABS 5.8  HGB 12.2  HCT 36.5  MCV 92.9  PLT 492   Basic Metabolic Panel: Recent Labs  Lab 08/05/18 08/09/18 1519 08/10/18 0053  NA 138 140 138  K 4.9 5.1 4.1  CL 101 102 97*  CO2 28 29 26   GLUCOSE  --  101* 87  BUN 16 17 17   CREATININE 1.0 0.92 0.99  CALCIUM 9.1 9.7 9.8   GFR: Estimated Creatinine Clearance: 29.4 mL/min (by C-G formula based on SCr of 0.99 mg/dL). Liver Function Tests: Recent Labs  Lab 08/09/18 1519  AST 45*  ALT 34  ALKPHOS 122  BILITOT 1.2  PROT 8.3*  ALBUMIN 3.8   No results for input(s): LIPASE, AMYLASE in the last 168 hours. Recent Labs  Lab 08/10/18 0053  AMMONIA 25   Coagulation Profile: No results for input(s): INR, PROTIME in the last 168 hours. Cardiac Enzymes: No results for input(s): CKTOTAL, CKMB, CKMBINDEX,  TROPONINI in the last 168 hours. BNP (last 3 results) No results for input(s): PROBNP in the last 8760 hours. HbA1C: No results for input(s): HGBA1C in the last 72 hours. CBG: No results for input(s): GLUCAP in the last 168 hours. Lipid Profile: No results for input(s): CHOL, HDL, LDLCALC, TRIG, CHOLHDL, LDLDIRECT in the last 72 hours. Thyroid Function Tests: Recent Labs    08/10/18 0053  TSH 2.321   Anemia Panel: No results for input(s): VITAMINB12, FOLATE, FERRITIN, TIBC, IRON, RETICCTPCT in the last 72 hours. Sepsis Labs: No results for input(s): PROCALCITON, LATICACIDVEN in the last 168 hours.  Recent Results (from the past 240 hour(s))  Urine culture     Status: Abnormal   Collection Time: 08/09/18  4:02 PM  Result Value Ref Range Status   Specimen Description   Final    URINE, RANDOM Performed at [place] [address], [place], [place] [address]    Special Requests   Final    NONE Performed at [facility], [place] [address] [place], [place] [address]    Culture (  A)  Final    <10,000 COLONIES/mL INSIGNIFICANT GROWTH Performed at [place] [address], [place], [place] [address]    Report Status 08/10/2018 FINAL  Final  MRSA PCR Screening     Status: None   Collection Time: 08/10/18  6:45 AM  Result Value Ref Range Status   MRSA by PCR NEGATIVE NEGATIVE Final    Comment:        The GeneXpert MRSA Assay (FDA approved for NASAL specimens only), is one component of a comprehensive MRSA colonization surveillance program. It is not intended to diagnose MRSA infection nor to guide or monitor treatment for MRSA infections. Performed at [facility], [place] [address] [place], [place] [address]          Radiology Studies: Ct Head Wo Contrast  Result Date: 08/09/2018 CLINICAL DATA:  Confusion. EXAM: CT HEAD WITHOUT CONTRAST TECHNIQUE: Contiguous axial images were obtained from the base  of the skull through the vertex without intravenous contrast. COMPARISON:  None. FINDINGS: Brain: No subdural, epidural, or subarachnoid hemorrhage. Cerebellum, brainstem, and basal cisterns are normal. No mass effect or midline shift. Moderate white matter changes are noted. No acute cortical ischemia or infarct. No mass effect or midline shift. Ventricles and sulci are prominent but otherwise unremarkable. Vascular: No hyperdense vessel or unexpected calcification. Skull: Normal. Negative for fracture or focal lesion. Sinuses/Orbits: No acute finding. Other: None. IMPRESSION: Chronic white matter changes.  No acute abnormalities noted. Electronically Signed   By: Dorise Bullion III M.D   On: 08/09/2018 16:54   Mr Brain Wo Contrast  Result Date: 08/09/2018 CLINICAL DATA:  Altered mental status. Possible aphasia. Recent pneumonia. Decreased responsiveness. The examination had to be discontinued prior to completion due to patient noncompliance. EXAM: MRI HEAD WITHOUT CONTRAST TECHNIQUE: Multiplanar, multiecho pulse sequences of the brain and surrounding structures were obtained without intravenous contrast. COMPARISON:  CT head 08/09/2018.  MRI brain 07/07/2007 FINDINGS: Brain: The diffusion-weighted images demonstrate no acute or subacute infarction. Vascular: Not evaluated without T2 weighted imaging. Skull and upper cervical spine: Craniocervical junction is grossly normal sagittal T1 weighted images are severely degraded by patient motion. Sinuses/Orbits: Paranasal sinuses and mastoid air cells are within normal limits on the sagittal T1 weighted imaging. IMPRESSION: 1. Diffusion-weighted imaging demonstrates no acute or subacute infarction. 2. Motion degraded sagittal T1 weighted imaging was the only other sequence performed without any gross abnormality. Electronically Signed   By: San Morelle M.D.   On: 08/09/2018 21:02   Dg Chest Port 1 View  Result Date: 08/09/2018 CLINICAL DATA:  Weakness  with disorientation and fall. History of lung and breast cancer. EXAM: PORTABLE CHEST 1 VIEW COMPARISON:  01/16/2018 FINDINGS: Increased or new densities at the left lung base are most compatible with pleural fluid with consolidation. Haziness in the right hilar region may represent vascular congestion. Cardiac silhouette appears to be enlargement but obscured by the left basilar chest disease. IMPRESSION: Left basilar chest densities. Findings are suggestive for a left pleural effusion with compressive atelectasis or consolidation. Left pleural effusion is probably small to moderate in size. Central vascular congestion. Electronically Signed   By: Markus Daft M.D.   On: 08/09/2018 15:20        Scheduled Meds: . atenolol  25 mg Oral BID  . B-complex with vitamin C  1 tablet Oral Daily  . DULoxetine  30 mg Oral Daily  . folic acid  979 mcg Oral Daily  . gabapentin  100 mg  Oral BID  . latanoprost  1 drop Both Eyes QHS  . pantoprazole  40 mg Oral Daily  . saccharomyces boulardii  250 mg Oral Daily   Continuous Infusions: . sodium chloride 75 mL/hr at 08/11/18 1855     LOS: 0 days     Georgette Shell, MD [facility] If 7PM-7AM, please contact night-coverage [email or web address] Password [facility] 08/11/2018, 11:37 AM

## 2018-08-11 NOTE — Evaluation (Addendum)
Physical Therapy Evaluation Patient Details Name: [name] MRN: [id number] DOB: 1921/08/06 Today's Date: 08/11/2018   History of Present Illness  84 y.o. female with medical history significant for pulmonary hypertension, hypertension, depression with anxiety,  presenting to the emergency 08/09/18 department from [facility] forr evaluation of confusion, lethargy, fall., hypertensive.  Chest x-ray is notable for a left basilar density with pleural effusion and compressive atelectasis versus pneumonia.  Noncontrast head CT is negative for acute intracranial abnormality  Clinical Impression  The patient is mostly lethargic with arousal when stimulated with sitting on bed edge. requires total assist for all mobility and demonstrates no balance while sitting. Patient may require higher level of care unless MS iimproves to be able to participate in ADL's. Pt admitted with above diagnosis. Pt currently with functional limitations due to the deficits listed below (see PT Problem List).  Pt will benefit from skilled PT to increase their independence and safety with mobility to allow discharge to the venue listed below.       Follow Up Recommendations SNFvs HH if able to return to ALF    Equipment Recommendations  None recommended by PT    Recommendations for Other Services       Precautions / Restrictions Precautions Precautions: Fall      Mobility  Bed Mobility Overal bed mobility: Needs Assistance Bed Mobility: Rolling;Sidelying to Sit;Sit to Sidelying Rolling: Total assist Sidelying to sit: Total assist     Sit to sidelying: Total assist General bed mobility comments: patient does not participate in mobility, all mobility is passive, rolled to each side, assisted legs to bed edge and used pad to lift trunk upright. Assisted back into bed. Patient is floppy  Transfers                 General transfer comment: NT  Ambulation/Gait                Stairs             Wheelchair Mobility    Modified Rankin (Stroke Patients Only)       Balance Overall balance assessment: History of Falls;Needs assistance Sitting-balance support: No upper extremity supported;Feet supported Sitting balance-Leahy Scale: Zero                                       Pertinent Vitals/Pain Pain Assessment: Faces Faces Pain Scale: Hurts little more Pain Location: non specific when moving Pain Intervention(s): Monitored during session    Home Living Family/patient expects to be discharged to:: Assisted living                      Prior Function Level of Independence: Needs assistance   Gait / Transfers Assistance Needed: mobilizes in [facility], assistanc e for ADL's, continent of [place] B           Hand Dominance        Extremity/Trunk Assessment   Upper Extremity Assessment Upper Extremity Assessment: Generalized weakness    Lower Extremity Assessment Lower Extremity Assessment: Generalized weakness    Cervical / Trunk Assessment Cervical / Trunk Assessment: Kyphotic  Communication   Communication: HOH  Cognition Arousal/Alertness: Lethargic   Overall Cognitive Status: Impaired/Different from baseline Area of Impairment: Orientation;Attention;Following commands  General Comments: patient is too lethargic to really participate. she did arouse to take 2 bites and drink x 1. speech is not  clwear with some reasponses clear.      General Comments      Exercises     Assessment/Plan    PT Assessment Patient needs continued PT services  PT Problem List Decreased strength;Decreased activity tolerance;Decreased balance;Decreased mobility;Decreased cognition       PT Treatment Interventions Functional mobility training;Therapeutic activities;Therapeutic exercise;Patient/family education    PT Goals (Current goals can be found in the Care Plan section)  Acute Rehab PT  Goals Patient Stated Goal: return to baseline PT Goal Formulation: With family Time For Goal Achievement: 08/25/18 Potential to Achieve Goals: Fair    Frequency Min 2X/week   Barriers to discharge        Co-evaluation               AM-PAC PT "6 Clicks" Daily Activity  Outcome Measure Difficulty turning over in bed (including adjusting bedclothes, sheets and blankets)?: Unable Difficulty moving from lying on back to sitting on the side of the bed? : Unable Difficulty sitting down on and standing up from a chair with arms (e.g., wheelchair, bedside commode, etc,.)?: Unable Help needed moving to and from a bed to chair (including a wheelchair)?: Total Help needed walking in hospital room?: Total Help needed climbing 3-5 steps with a railing? : Total 6 Click Score: 6    End of Session   Activity Tolerance: Patient limited by lethargy Patient left: in bed;with bed alarm set;with call bell/phone within reach;with family/visitor present Nurse Communication: Mobility status PT Visit Diagnosis: Unsteadiness on feet (R26.81)    Time: 8546-2703 PT Time Calculation (min) (ACUTE ONLY): 24 min   Charges:   PT Evaluation $PT Eval Low Complexity: 1 Low PT Treatments $Therapeutic Activity: 8-22 mins        [facility] [facility] [phone]   Claretha Cooper 08/11/2018, 2:24 PM

## 2018-08-11 NOTE — Progress Notes (Signed)
  Echocardiogram 2D Echocardiogram has been performed.  [name] 08/11/2018, 3:13 PM

## 2018-08-11 NOTE — Consult Note (Signed)
CARDIOLOGY CONSULT NOTE  Patient ID: [name] MRN: [id number] DOB/AGE: Jun 05, 1921 84 y.o.  Admit date: 08/09/2018 Referring Physician  Jacki Cones, MD Primary Physician:  Blanchie Serve, MD Reason for Consultation  Shortness of breath and pulmonary hypertension  HPI: [name]  is a 84 y.o. female  With Patient with hypertension, anxiety, bronchitis, who lives in a nursing home, who was relatively independent and was able to balance her books, living fairly independently, has a diagnosis of "pulmonary hypertension", admitted to the hospital with altered mental status.  She is now being treated for UTI as a possible etiology for acute encephalopathy along with dehydration.  I was asked to opine regarding pulmonary hypertension as her son was very concerned about the diagnosis of pulmonary hypertension.  Patient's daughter-in-law is present at the bedside.  Patient herself is unable to give much history but is able to hold on some conversation.  Past Medical History:  Diagnosis Date  . Allergic rhinitis   . Anemia    NOS  . Anxiety   . Biceps tendon rupture    Bilateral  . Breast cancer ([place]) 2001   Left s/p lumpectomy and XRT   . Bronchiectasis ([place]) 2014   Noted on chest x-ray  . Complication of anesthesia    slow to wake up  . Cough 2014  . Depression   . Diverticulosis   . Elevated liver enzymes    Biopsy consistent with steatohepatitis  . Fundic gland polyps of stomach, benign   . GERD (gastroesophageal reflux disease) 06/2007   EGD Dr Gala Romney >sm HH, multiple fundic gland polyps  . Glaucoma 2013   both eyes  . Hemorrhoids   . Hiatal hernia   . Hx of radiation therapy 07/17/10 to 07/21/10   SRS LLL lung  . Hyperlipidemia   . Insomnia   . Low back pain    scoliosis  . Lung cancer ([place]) 05/2010   Left 2011 - rad x 3  . Lymphedema    Left arm  . Osteoarthritis   . Osteopenia 10/25/2016  . Osteoporosis, senile   . Overactive bladder   . Pneumonia    RLL with  sepsis  . PSVT (paroxysmal supraventricular tachycardia) ([place])   . Rheumatic fever    Age 54  . Right thyroid nodule   . Steatohepatitis    liver bx  . Type 2 diabetes mellitus ([place])      Past Surgical History:  Procedure Laterality Date  . ABDOMINAL HYSTERECTOMY  1964  . APPENDECTOMY  1964  . BIOPSY THYROID  11/2008  . BREAST BIOPSY     X 3 w/ cystectomy  . BREAST LUMPECTOMY  2001  . CATARACT EXTRACTION Bilateral 2003   Lens implant Dr. Charise Killian  . CHOLECYSTECTOMY  1965  . COLONOSCOPY  09/11/2011   Procedure: COLONOSCOPY;  Surgeon: Dorothyann Peng, MD;  Location[facility];  Service: Endoscopy;  Laterality: N/A;  . COLONOSCOPY  2005 /2012   Dr. Lucianne Muss- diverticulosis, ext hemorrhoids.  . COLONOSCOPY WITH PROPOFOL N/A 11/09/2017   Procedure: COLONOSCOPY WITH PROPOFOL;  Surgeon: Mauri Pole, MD;  Location: [facility];  Service: Endoscopy;  Laterality: N/A;  . COLONOSCOPY WITH PROPOFOL N/A 11/12/2017   Procedure: COLONOSCOPY WITH PROPOFOL;  Surgeon: Ladene Artist, MD;  Location: [facility];  Service: Endoscopy;  Laterality: N/A;  . CYSTOSCOPY  2007  . ESOPHAGOGASTRODUODENOSCOPY  09/11/2011   Procedure: ESOPHAGOGASTRODUODENOSCOPY (EGD);  Surgeon: Dorothyann Peng, MD;  [facility];  Service: Endoscopy;  Laterality: N/A;  . ESOPHAGOGASTRODUODENOSCOPY (EGD) WITH PROPOFOL N/A 11/08/2017   Procedure: ESOPHAGOGASTRODUODENOSCOPY (EGD) WITH PROPOFOL;  Surgeon: Mauri Pole, MD;  Location[facility];  Service: Endoscopy;  Laterality: N/A;  . LIVER BIOPSY  2008  . LUNG BIOPSY Left 06/06/2010  . REVISION TOTAL HIP ARTHROPLASTY  2007   Left  . SKIN CANCER EXCISION     nose and left lower cheek  . TONSILLECTOMY  1930  . TOTAL HIP ARTHROPLASTY Bilateral 2005 & 2007    Dr. Earl Lagos. Aline Brochure      Family History  Problem Relation Age of Onset  . Heart failure Mother   . Osteoporosis Mother   . Hypertension Father   . Stroke Father   . Heart failure  Father   . Leukemia Brother   . Heart failure Brother   . Cancer Maternal Grandmother   . Stroke Maternal Grandfather   . Cancer Unknown      Social History: Social History   Socioeconomic History  . Marital status: Widowed    Spouse name: Not on file  . Number of children: 2  . Years of education: college   . Highest education level: Not on file  Occupational History  . Occupation: retired Tax Comptroller: RETIRED  Social Needs  . Financial resource strain: Not on file  . Food insecurity:    Worry: Not on file    Inability: Not on file  . Transportation needs:    Medical: Not on file    Non-medical: Not on file  Tobacco Use  . Smoking status: Never Smoker  . Smokeless tobacco: Never Used  . Tobacco comment: 2nd hand-husband  Substance and Sexual Activity  . Alcohol use: No  . Drug use: No  . Sexual activity: Not Currently  Lifestyle  . Physical activity:    Days per week: Not on file    Minutes per session: Not on file  . Stress: Not on file  Relationships  . Social connections:    Talks on phone: Not on file    Gets together: Not on file    Attends religious service: Not on file    Active member of club or organization: Not on file    Attends meetings of clubs or organizations: Not on file    Relationship status: Not on file  . Intimate partner violence:    Fear of current or ex partner: Not on file    Emotionally abused: Not on file    Physically abused: Not on file    Forced sexual activity: Not on file  Other Topics Concern  . Not on file  Social History Narrative   Lives at [facility] 02/2013   2 adopted children   Was in [place]   Widowed 2013   Walks with walker   Exercise: none    POA - Living Will     Medications Prior to Admission  Medication Sig Dispense Refill Last Dose  . acetaminophen (TYLENOL) 325 MG tablet Take 650 mg by mouth every 6 (six) hours as needed for mild pain.    08/09/2018 at Unknown time  .  atenolol (TENORMIN) 25 MG tablet Take 25 mg by mouth daily.   08/09/2018 at 8:45am  . B Complex Vitamins (B COMPLEX-B12) TABS Take 1 tablet by mouth daily.   08/09/2018 at Unknown time  . cetirizine (ZYRTEC) 10 MG tablet Take 10 mg by mouth daily.   08/09/2018 at Unknown  time  . DULoxetine (CYMBALTA) 30 MG capsule Take 30 mg by mouth daily.   08/09/2018 at Unknown time  . ferrous gluconate (FERGON) 240 (27 FE) MG tablet Take 240 mg by mouth daily.   08/09/2018 at Unknown time  . folic acid (FOLVITE) 161 MCG tablet Take 400 mcg by mouth daily.   08/09/2018 at Unknown time  . gabapentin (NEURONTIN) 100 MG capsule Take 100 mg by mouth 2 (two) times daily.   08/09/2018 at Unknown time  . hydrocortisone 2.5 % ointment Apply 1 application topically daily as needed.    Past Month at Unknown time  . ipratropium-albuterol (DUONEB) 0.5-2.5 (3) MG/3ML SOLN Take 3 mLs by nebulization every 6 (six) hours as needed.   08/09/2018 at Unknown time  . latanoprost (XALATAN) 0.005 % ophthalmic solution Place 1 drop into both eyes at bedtime.   08/08/2018 at Unknown time  . lidocaine (LIDODERM) 5 % Place 1 patch onto the skin daily as needed. Remove & Discard patch within 12 hours or as directed by MD    Past Month at Unknown time  . mirtazapine (REMERON) 15 MG tablet Take 7.5 mg by mouth at bedtime.   08/08/2018 at Unknown time  . Multiple Vitamins-Minerals (PRESERVISION AREDS 2 PO) Take 1 capsule by mouth 2 (two) times daily.    08/09/2018 at Unknown time  . omeprazole (PRILOSEC) 20 MG capsule TAKE 1 CAPSULE EVERY DAY. 90 capsule 1 08/09/2018 at Unknown time  . polyethylene glycol (MIRALAX / GLYCOLAX) packet Take 17 g by mouth daily as needed.    Past Month at Unknown time  . potassium chloride SA (K-DUR,KLOR-CON) 20 MEQ tablet Take 40 mEq by mouth daily.    08/09/2018 at Unknown time  . saccharomyces boulardii (FLORASTOR) 250 MG capsule Take 250 mg by mouth as needed (same day as any Antibiotic).   Past Month at Unknown time  .  Simethicone 80 MG TABS Take 1 tablet by mouth 4 (four) times daily as needed.   08/08/2018 at Unknown time  . [DISCONTINUED] furosemide (LASIX) 20 MG tablet Take 20 mg by mouth daily.   08/09/2018 at Unknown time   ROS Unable to be obtained by patient,   Physical Exam: Blood pressure (!) 147/76, pulse (!) 101, temperature 99.2 F (37.3 C), temperature source Oral, resp. rate 18, height 5' 4"  (1.626 m), weight 61.7 kg, SpO2 98 %.  Physical Exam  Constitutional:  Petite. No acute distress  HENT:  Head: Atraumatic.  Eyes: Conjunctivae are normal.  Neck: Neck supple. No JVD present. No tracheal deviation present. No thyromegaly present.  Cardiovascular: Normal rate, regular rhythm and normal heart sounds.  Pulses:      Carotid pulses are 3+ on the right side, and 3+ on the left side.      Femoral pulses are 3+ on the right side, and 3+ on the left side.      Dorsalis pedis pulses are 1+ on the right side, and 1+ on the left side.       Posterior tibial pulses are 1+ on the right side, and 1+ on the left side.  Pulmonary/Chest: Effort normal and breath sounds normal.  Abdominal: Soft. Bowel sounds are normal.  Musculoskeletal: Normal range of motion. She exhibits no edema.  Neurological: She is alert.  Skin: Skin is warm and dry.  Psychiatric: She has a normal mood and affect.   Labs:   Lab Results  Component Value Date   WBC 7.1 08/09/2018   HGB 12.2  08/09/2018   HCT 36.5 08/09/2018   MCV 92.9 08/09/2018   PLT 297 08/09/2018    Recent Labs  Lab 08/09/18 1519 08/10/18 0053  NA 140 138  K 5.1 4.1  CL 102 97*  CO2 29 26  BUN 17 17  CREATININE 0.92 0.99  CALCIUM 9.7 9.8  PROT 8.3*  --   BILITOT 1.2  --   ALKPHOS 122  --   ALT 34  --   AST 45*  --   GLUCOSE 101* 87    Lipid Panel     Component Value Date/Time   CHOL 188 04/12/2016   TRIG 122 04/12/2016   HDL 62 04/12/2016   CHOLHDL 3.0 Ratio 06/07/2008 2233   VLDL 31 06/07/2008 2233   LDLCALC 102 04/12/2016    HEMOGLOBIN A1C Lab Results  Component Value Date   HGBA1C 5.3 04/16/2017    Cardiac Panel (last 3 results) No results for input(s): CKTOTAL, CKMB, TROPONINI, RELINDX in the last 8760 hours.  Lab Results  Component Value Date   CKTOTAL 57 07/07/2007   CKMB 2.1 07/07/2007   TROPONINI 0.03        NO INDICATION OF MYOCARDIAL INJURY. 07/07/2007     TSH Recent Labs    11/20/17 08/10/18 0053  TSH 2.00 2.321   Radiology: Dg Chest 1 View  Result Date: 08/11/2018 CLINICAL DATA:  84 year old female with confusion,, hypoxia. Personal history of lung and left breast cancer. EXAM: CHEST  1 VIEW COMPARISON:  08/09/2018 chest radiographs and earlier. FINDINGS: Distal bronchiectasis previously demonstrated in the middle lobes and left lower lobe on CT 01/24/2015. Stable lung volumes. Upper lung and right lung base ventilation is stable. Acute on chronic increased left lung base opacity appears veiling and stable since 08/09/2018. Stable obscuration of the left hemidiaphragm. Stable cardiac size and mediastinal contours. Negative visible upper abdominal bowel gas pattern. Visualized tracheal air column is within normal limits. Stable visualized osseous structures. IMPRESSION: 1. Small left pleural effusion suspected and stable since 08/09/2018 with associated left lung base atelectasis or consolidation. 2. No new cardiopulmonary abnormality. Electronically Signed   By: Genevie Ann M.D.   On: 08/11/2018 14:34   Mr Brain Wo Contrast  Result Date: 08/09/2018 CLINICAL DATA:  Altered mental status. Possible aphasia. Recent pneumonia. Decreased responsiveness. The examination had to be discontinued prior to completion due to patient noncompliance. EXAM: MRI HEAD WITHOUT CONTRAST TECHNIQUE: Multiplanar, multiecho pulse sequences of the brain and surrounding structures were obtained without intravenous contrast. COMPARISON:  CT head 08/09/2018.  MRI brain 07/07/2007 FINDINGS: Brain: The diffusion-weighted  images demonstrate no acute or subacute infarction. Vascular: Not evaluated without T2 weighted imaging. Skull and upper cervical spine: Craniocervical junction is grossly normal sagittal T1 weighted images are severely degraded by patient motion. Sinuses/Orbits: Paranasal sinuses and mastoid air cells are within normal limits on the sagittal T1 weighted imaging. IMPRESSION: 1. Diffusion-weighted imaging demonstrates no acute or subacute infarction. 2. Motion degraded sagittal T1 weighted imaging was the only other sequence performed without any gross abnormality. Electronically Signed   By: San Morelle M.D.   On: 08/09/2018 21:02    Scheduled Meds: . atenolol  25 mg Oral BID  . B-complex with vitamin C  1 tablet Oral Daily  . DULoxetine  30 mg Oral Daily  . folic acid  350 mcg Oral Daily  . gabapentin  100 mg Oral BID  . latanoprost  1 drop Both Eyes QHS  . pantoprazole  40 mg Oral Daily  .  saccharomyces boulardii  250 mg Oral Daily   Continuous Infusions: . sodium chloride 75 mL/hr at 08/11/18 1813   PRN Meds:.acetaminophen **OR** acetaminophen, haloperidol lactate, hydrALAZINE, ipratropium-albuterol, lidocaine, ondansetron **OR** ondansetron (ZOFRAN) IV, polyethylene glycol, simethicone  CARDIAC STUDIES: EKG 08/09/2018: Normal sinus rhythm with rate of 77 bpm, left axis deviation, LVH.  Normal QT interval.  Nonspecific T abnormality.  PAC.  Echocardiogram 08/11/2018: Normal LV size, mild LVH, EF 55 to 16%, grade 1 diastolic dysfunction with elevated LVEDP.  Trace aortic regurgitation.  Mild MR, trace TR, PA pressure estimated at 30 mmHg.  No evidence of IVC dilatation.  CVP appears to be normal.  Scheduled Meds: . atenolol  25 mg Oral BID  . B-complex with vitamin C  1 tablet Oral Daily  . DULoxetine  30 mg Oral Daily  . folic acid  109 mcg Oral Daily  . gabapentin  100 mg Oral BID  . latanoprost  1 drop Both Eyes QHS  . pantoprazole  40 mg Oral Daily  . saccharomyces  boulardii  250 mg Oral Daily   Continuous Infusions: . sodium chloride 75 mL/hr at 08/11/18 1813   PRN Meds:.acetaminophen **OR** acetaminophen, haloperidol lactate, hydrALAZINE, ipratropium-albuterol, lidocaine, ondansetron **OR** ondansetron (ZOFRAN) IV, polyethylene glycol, simethicone   ASSESSMENT AND PLAN:  1.  Patient's perceived shortness of breath as per family there is very mild and class I-II is probably due to chronic diastolic heart failure. 2.  Pulmonary hypertension is probably an erroneous diagnosis, she may have secondary pulmonary hypertension with PA pressure on the upper limit of normal secondary to  diastolic dysfunction and chronic diastolic heart failure due to normal aging.  Recommendation: I would not recommend any further evaluation from cardiac standpoint.  I have discussed with the patient's daughter-in-law and explained to her regarding etiology and management and implications of pulmonary hypertension which the patient does not have.  There is no clinical evidence of acute decompensated heart failure either LV or RV.  Thank you for the consultation.  Adrian Prows, MD 08/11/2018, 6:32 PM [facility] [place] Pager: [phone] Office: [phone] If no answer Cell [phone]

## 2018-08-12 DIAGNOSIS — E8809 Other disorders of plasma-protein metabolism, not elsewhere classified: Secondary | ICD-10-CM | POA: Diagnosis not present

## 2018-08-12 DIAGNOSIS — I480 Paroxysmal atrial fibrillation: Secondary | ICD-10-CM | POA: Diagnosis not present

## 2018-08-12 DIAGNOSIS — D509 Iron deficiency anemia, unspecified: Secondary | ICD-10-CM | POA: Diagnosis not present

## 2018-08-12 DIAGNOSIS — J9620 Acute and chronic respiratory failure, unspecified whether with hypoxia or hypercapnia: Secondary | ICD-10-CM | POA: Diagnosis not present

## 2018-08-12 DIAGNOSIS — R29898 Other symptoms and signs involving the musculoskeletal system: Secondary | ICD-10-CM | POA: Diagnosis not present

## 2018-08-12 DIAGNOSIS — M25562 Pain in left knee: Secondary | ICD-10-CM | POA: Diagnosis not present

## 2018-08-12 DIAGNOSIS — J181 Lobar pneumonia, unspecified organism: Secondary | ICD-10-CM | POA: Diagnosis not present

## 2018-08-12 DIAGNOSIS — E785 Hyperlipidemia, unspecified: Secondary | ICD-10-CM | POA: Diagnosis not present

## 2018-08-12 DIAGNOSIS — D649 Anemia, unspecified: Secondary | ICD-10-CM | POA: Diagnosis not present

## 2018-08-12 DIAGNOSIS — R4789 Other speech disturbances: Secondary | ICD-10-CM | POA: Diagnosis not present

## 2018-08-12 DIAGNOSIS — R634 Abnormal weight loss: Secondary | ICD-10-CM | POA: Diagnosis not present

## 2018-08-12 DIAGNOSIS — G9340 Encephalopathy, unspecified: Secondary | ICD-10-CM | POA: Diagnosis not present

## 2018-08-12 DIAGNOSIS — K219 Gastro-esophageal reflux disease without esophagitis: Secondary | ICD-10-CM | POA: Diagnosis not present

## 2018-08-12 DIAGNOSIS — B373 Candidiasis of vulva and vagina: Secondary | ICD-10-CM | POA: Diagnosis not present

## 2018-08-12 DIAGNOSIS — R41841 Cognitive communication deficit: Secondary | ICD-10-CM | POA: Diagnosis not present

## 2018-08-12 DIAGNOSIS — R488 Other symbolic dysfunctions: Secondary | ICD-10-CM | POA: Diagnosis not present

## 2018-08-12 DIAGNOSIS — J90 Pleural effusion, not elsewhere classified: Secondary | ICD-10-CM | POA: Diagnosis not present

## 2018-08-12 DIAGNOSIS — R69 Illness, unspecified: Secondary | ICD-10-CM | POA: Diagnosis not present

## 2018-08-12 DIAGNOSIS — E876 Hypokalemia: Secondary | ICD-10-CM | POA: Diagnosis not present

## 2018-08-12 DIAGNOSIS — Z7189 Other specified counseling: Secondary | ICD-10-CM | POA: Diagnosis not present

## 2018-08-12 DIAGNOSIS — R2689 Other abnormalities of gait and mobility: Secondary | ICD-10-CM | POA: Diagnosis not present

## 2018-08-12 DIAGNOSIS — E871 Hypo-osmolality and hyponatremia: Secondary | ICD-10-CM | POA: Diagnosis not present

## 2018-08-12 DIAGNOSIS — I10 Essential (primary) hypertension: Secondary | ICD-10-CM | POA: Diagnosis not present

## 2018-08-12 DIAGNOSIS — G629 Polyneuropathy, unspecified: Secondary | ICD-10-CM | POA: Diagnosis not present

## 2018-08-12 DIAGNOSIS — J9601 Acute respiratory failure with hypoxia: Secondary | ICD-10-CM | POA: Diagnosis not present

## 2018-08-12 DIAGNOSIS — Z9181 History of falling: Secondary | ICD-10-CM | POA: Diagnosis not present

## 2018-08-12 DIAGNOSIS — R1084 Generalized abdominal pain: Secondary | ICD-10-CM | POA: Diagnosis not present

## 2018-08-12 DIAGNOSIS — R509 Fever, unspecified: Secondary | ICD-10-CM | POA: Diagnosis not present

## 2018-08-12 DIAGNOSIS — I5032 Chronic diastolic (congestive) heart failure: Secondary | ICD-10-CM | POA: Diagnosis not present

## 2018-08-12 DIAGNOSIS — F418 Other specified anxiety disorders: Secondary | ICD-10-CM | POA: Diagnosis not present

## 2018-08-12 DIAGNOSIS — H903 Sensorineural hearing loss, bilateral: Secondary | ICD-10-CM | POA: Diagnosis not present

## 2018-08-12 DIAGNOSIS — M6281 Muscle weakness (generalized): Secondary | ICD-10-CM | POA: Diagnosis not present

## 2018-08-12 DIAGNOSIS — G8929 Other chronic pain: Secondary | ICD-10-CM | POA: Diagnosis not present

## 2018-08-12 DIAGNOSIS — I270 Primary pulmonary hypertension: Secondary | ICD-10-CM | POA: Diagnosis not present

## 2018-08-12 DIAGNOSIS — J9611 Chronic respiratory failure with hypoxia: Secondary | ICD-10-CM | POA: Diagnosis not present

## 2018-08-12 DIAGNOSIS — Z85118 Personal history of other malignant neoplasm of bronchus and lung: Secondary | ICD-10-CM | POA: Diagnosis not present

## 2018-08-12 DIAGNOSIS — R278 Other lack of coordination: Secondary | ICD-10-CM | POA: Diagnosis not present

## 2018-08-12 DIAGNOSIS — Z0000 Encounter for general adult medical examination without abnormal findings: Secondary | ICD-10-CM | POA: Diagnosis not present

## 2018-08-12 DIAGNOSIS — I272 Pulmonary hypertension, unspecified: Secondary | ICD-10-CM | POA: Diagnosis not present

## 2018-08-12 DIAGNOSIS — M2550 Pain in unspecified joint: Secondary | ICD-10-CM | POA: Diagnosis not present

## 2018-08-12 DIAGNOSIS — I110 Hypertensive heart disease with heart failure: Secondary | ICD-10-CM | POA: Diagnosis not present

## 2018-08-12 DIAGNOSIS — N760 Acute vaginitis: Secondary | ICD-10-CM | POA: Diagnosis not present

## 2018-08-12 DIAGNOSIS — R2681 Unsteadiness on feet: Secondary | ICD-10-CM | POA: Diagnosis not present

## 2018-08-12 DIAGNOSIS — D0220 Carcinoma in situ of unspecified bronchus and lung: Secondary | ICD-10-CM | POA: Diagnosis not present

## 2018-08-12 DIAGNOSIS — M5440 Lumbago with sciatica, unspecified side: Secondary | ICD-10-CM | POA: Diagnosis not present

## 2018-08-12 DIAGNOSIS — E611 Iron deficiency: Secondary | ICD-10-CM | POA: Diagnosis not present

## 2018-08-12 DIAGNOSIS — Z853 Personal history of malignant neoplasm of breast: Secondary | ICD-10-CM | POA: Diagnosis not present

## 2018-08-12 DIAGNOSIS — R748 Abnormal levels of other serum enzymes: Secondary | ICD-10-CM | POA: Diagnosis not present

## 2018-08-12 DIAGNOSIS — R531 Weakness: Secondary | ICD-10-CM | POA: Diagnosis not present

## 2018-08-12 DIAGNOSIS — Z23 Encounter for immunization: Secondary | ICD-10-CM | POA: Diagnosis not present

## 2018-08-12 DIAGNOSIS — Z7401 Bed confinement status: Secondary | ICD-10-CM | POA: Diagnosis not present

## 2018-08-12 DIAGNOSIS — R05 Cough: Secondary | ICD-10-CM | POA: Diagnosis not present

## 2018-08-12 DIAGNOSIS — M25561 Pain in right knee: Secondary | ICD-10-CM | POA: Diagnosis not present

## 2018-08-12 LAB — BASIC METABOLIC PANEL
Anion gap: 12 (ref 5–15)
BUN: 23 mg/dL (ref 8–23)
CO2: 28 mmol/L (ref 22–32)
CREATININE: 0.89 mg/dL (ref 0.44–1.00)
Calcium: 8.9 mg/dL (ref 8.9–10.3)
Chloride: 93 mmol/L — ABNORMAL LOW (ref 98–111)
GFR calc Af Amer: >60 mL/min (ref >60)
GFR, EST NON AFRICAN AMERICAN: 53 mL/min — AB (ref >60)
GLUCOSE: 78 mg/dL (ref 70–99)
POTASSIUM: 3.6 mmol/L (ref 3.5–5.1)
SODIUM: 133 mmol/L — AB (ref 135–145)

## 2018-08-12 MED ORDER — AZITHROMYCIN 250 MG PO TABS: ORAL_TABLET | ORAL | 0 refills | Status: AC

## 2018-08-12 MED ORDER — SACCHAROMYCES BOULARDII 250 MG PO CAPS: 250.0000 mg | ORAL_CAPSULE | Freq: Two times a day (BID) | ORAL | Status: DC

## 2018-08-12 MED ORDER — SODIUM CHLORIDE 0.9 % IV SOLN
500.0000 mg | INTRAVENOUS | Status: DC
  Administered 2018-08-12: 500 mg via INTRAVENOUS
  Filled 2018-08-12: qty 500

## 2018-08-12 MED ORDER — AMOXICILLIN-POT CLAVULANATE 875-125 MG PO TABS: 1.0000 | ORAL_TABLET | Freq: Two times a day (BID) | ORAL | Status: DC

## 2018-08-12 MED ORDER — AMOXICILLIN-POT CLAVULANATE 875-125 MG PO TABS: 1.0000 | ORAL_TABLET | Freq: Two times a day (BID) | ORAL | 0 refills | Status: DC

## 2018-08-12 NOTE — Progress Notes (Signed)
Called report to the nurse at [facility]

## 2018-08-12 NOTE — Care Management Obs Status (Signed)
[place] NOTIFICATION   Patient Details  Name: [name] MRN: [id number] Date of Birth: Feb 11, 1921   Medicare Observation Status Notification Given:       Lynnell Catalan, RN 08/12/2018, 12:31 PM

## 2018-08-12 NOTE — Discharge Summary (Signed)
Physician Discharge Summary  [name] WHQ:[id number] DOB: [birth year] DOA: 08/09/2018  PCP: Blanchie Serve, MD  Admit date: 08/09/2018 Discharge date: 08/12/2018  Admitted From SNF Disposition:SNF Recommendations for Outpatient Follow-up:  1. Follow up with PCP in 1-2 weeks 2. Please obtain BMP/CBC in one week  Home Health:NONE Equipment/DevicesNONE Discharge Condition STABLE CODE STATUS:DNR Diet recommendationCARDIAC Brief/Interim Summary:84 y.o.femalewith medical history significant forpulmonary hypertension, hypertension, depression with anxiety, now presenting to the emergency department from her nursing facility for evaluation of confusion. Patient is accompanied by her son who assist with the history. She seemed to be in her usual state the evening of 08/08/2018 when he spoke with her on the phone. 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 Today, nursing facility staff reports that she was lethargic and confused. She is complaining of urinary urgency and dysuria. She reportedly fell at the nursing home today without any appreciable trauma and no loss of consciousness.  ED Course:Upon arrival to the ED, patient is found to be afebrile, saturating well on room air, and hypertensive to 190/100. EKG features a sinus rhythm with PAC and LVH. Chest x-ray is notable for a left basilar density with pleural effusion and compressive atelectasis versus pneumonia. Noncontrast head CT is negative for acute intracranial abnormality. MRI brain is negative for acute or subacute stroke. Chemistry panel and CBC are unremarkable. Troponin is normal. Urinalysis features large leukocytes but no bacteria are seen. Patient was given 40 mg of IV Lasix, Levaquin, 1 mg IV Ativan, and IV Benadryl in the ED. She remains hemodynamically stable, but disoriented and appears to be in significant  discomfort. She will be observed for ongoing evaluation and management of acute encephalopathy, possibly secondary to UTI. Discharge Diagnoses:  Principal Problem:   Acute encephalopathy Active Problems:   Depression with anxiety   HTN (hypertension)   Pulmonary hypertension, primary (HCC)   Hypertensive urgency   Dysuria  1] due to encephalopathy patient was admitted with increasing confusion and change in mental status.  Work-up during this hospitalization includes urine culture with no growth.  She was initially started on cefepime thinking she probably does have a UTI.  Once her culture came back the cefepime was stopped.  A chest x-ray that was done showed a very small left pleural effusion with atelectasis versus consolidation.  Patient remained afebrile with no signs or symptoms of cough or shortness of breath.  She uses oxygen at home prior to admission to the hospital.  Due to concerning factors where patient was started on Lasix on the day she was admitted to the hospital which makes me think that she probably was dehydrated.  The other possibility is starting Remeron 10 days or 2 weeks prior to admission to the hospital.  So today I am stopping both Lasix and the Remeron please observe her with off of both the medications.  I will continue and finish the course of a Z-Pak for possible left lower lobe infiltrates.  She was also seen by cardiologist per her son's request for pulmonary hypertension.  An echocardiogram that was done here really showed very mild pulmonary hypertension.  They do not recommend any new interventions for that at this time.    2] hypertension continue beta-blocker.  3] depression continue Cymbalta.  Discharge Instructions  Discharge Instructions    Call MD for:  difficulty breathing, headache or visual disturbances   Complete by:  As directed    Call MD for:  persistant nausea and vomiting   Complete by:  As directed    Call MD for:  severe uncontrolled  pain   Complete by:  As directed    Diet - low sodium heart healthy   Complete by:  As directed    Increase activity slowly   Complete by:  As directed      Allergies as of 08/12/2018      Reactions   Augmentin [amoxicillin-pot Clavulanate] Other (See Comments)   unknown   Ibuprofen Hives   Naproxen Other (See Comments)   unknown   Statins Other (See Comments)   Feels like knives in stomach   Surgilube [gyne-moistrin]    Rectal itching, burning   Tape Rash      Medication List    STOP taking these medications   furosemide 20 MG tablet Commonly known as:  LASIX   hydrocortisone 2.5 % ointment   mirtazapine 15 MG tablet Commonly known as:  REMERON   potassium chloride SA 20 MEQ tablet Commonly known as:  K-DUR,KLOR-CON     TAKE these medications   acetaminophen 325 MG tablet Commonly known as:  TYLENOL Take 650 mg by mouth every 6 (six) hours as needed for mild pain.   atenolol 25 MG tablet Commonly known as:  TENORMIN Take 1 tablet (25 mg total) by mouth 2 (two) times daily. What changed:  when to take this   azithromycin 250 MG tablet Commonly known as:  ZITHROMAX Take 2 tablets (500 mg) on  Day 1,  followed by 1 tablet (250 mg) once daily on Days 2 through 5.   B Complex-B12 Tabs Take 1 tablet by mouth daily.   cetirizine 10 MG tablet Commonly known as:  ZYRTEC Take 10 mg by mouth daily.   DULoxetine 30 MG capsule Commonly known as:  CYMBALTA Take 30 mg by mouth daily.   ferrous gluconate 240 (27 FE) MG tablet Commonly known as:  FERGON Take 240 mg by mouth daily.   folic acid 557 MCG tablet Commonly known as:  FOLVITE Take 400 mcg by mouth daily.   gabapentin 100 MG capsule Commonly known as:  NEURONTIN Take 100 mg by mouth 2 (two) times daily.   ipratropium-albuterol 0.5-2.5 (3) MG/3ML Soln Commonly known as:  DUONEB Take 3 mLs by nebulization every 6 (six) hours as needed.   latanoprost 0.005 % ophthalmic solution Commonly known as:   XALATAN Place 1 drop into both eyes at bedtime.   lidocaine 5 % Commonly known as:  LIDODERM Place 1 patch onto the skin daily as needed. Remove & Discard patch within 12 hours or as directed by MD   omeprazole 20 MG capsule Commonly known as:  PRILOSEC TAKE 1 CAPSULE EVERY DAY.   polyethylene glycol packet Commonly known as:  MIRALAX / GLYCOLAX Take 17 g by mouth daily as needed.   PRESERVISION AREDS 2 PO Take 1 capsule by mouth 2 (two) times daily.   saccharomyces boulardii 250 MG capsule Commonly known as:  FLORASTOR Take 250 mg by mouth as needed (same day as any Antibiotic). What changed:  Another medication with the same name was added. Make sure you understand how and when to take each.   saccharomyces boulardii 250 MG capsule Commonly known as:  FLORASTOR Take 1 capsule (250 mg total) by mouth 2 (two) times daily. What changed:  You were already taking a medication with the same name, and this prescription was added.  Make sure you understand how and when to take each.   Simethicone 80 MG Tabs Take 1 tablet by mouth 4 (four) times daily as needed.      Contact information for after-discharge care    Destination    [facility] .   Service:  Assisted Living Contact information: [place] [phone]             Allergies  Allergen Reactions  . Augmentin [Amoxicillin-Pot Clavulanate] Other (See Comments)    unknown  . Ibuprofen Hives  . Naproxen Other (See Comments)    unknown  . Statins Other (See Comments)    Feels like knives in stomach  . Surgilube [Gyne-Moistrin]     Rectal itching, burning  . Tape Rash    Consultations:  CARDIOLOGY   Procedures/Studies: Dg Chest 1 View  Result Date: 08/11/2018 CLINICAL DATA:  84 year old female with confusion,, hypoxia. Personal history of lung and left breast cancer. EXAM: CHEST  1 VIEW COMPARISON:  08/09/2018 chest radiographs and  earlier. FINDINGS: Distal bronchiectasis previously demonstrated in the middle lobes and left lower lobe on CT 01/24/2015. Stable lung volumes. Upper lung and right lung base ventilation is stable. Acute on chronic increased left lung base opacity appears veiling and stable since 08/09/2018. Stable obscuration of the left hemidiaphragm. Stable cardiac size and mediastinal contours. Negative visible upper abdominal bowel gas pattern. Visualized tracheal air column is within normal limits. Stable visualized osseous structures. IMPRESSION: 1. Small left pleural effusion suspected and stable since 08/09/2018 with associated left lung base atelectasis or consolidation. 2. No new cardiopulmonary abnormality. Electronically Signed   By: Genevie Ann M.D.   On: 08/11/2018 14:34   Ct Head Wo Contrast  Result Date: 08/09/2018 CLINICAL DATA:  Confusion. EXAM: CT HEAD WITHOUT CONTRAST TECHNIQUE: Contiguous axial images were obtained from the base of the skull through the vertex without intravenous contrast. COMPARISON:  None. FINDINGS: Brain: No subdural, epidural, or subarachnoid hemorrhage. Cerebellum, brainstem, and basal cisterns are normal. No mass effect or midline shift. Moderate white matter changes are noted. No acute cortical ischemia or infarct. No mass effect or midline shift. Ventricles and sulci are prominent but otherwise unremarkable. Vascular: No hyperdense vessel or unexpected calcification. Skull: Normal. Negative for fracture or focal lesion. Sinuses/Orbits: No acute finding. Other: None. IMPRESSION: Chronic white matter changes.  No acute abnormalities noted. Electronically Signed   By: Dorise Bullion III M.D   On: 08/09/2018 16:54   Mr Brain Wo Contrast  Result Date: 08/09/2018 CLINICAL DATA:  Altered mental status. Possible aphasia. Recent pneumonia. Decreased responsiveness. The examination had to be discontinued prior to completion due to patient noncompliance. EXAM: MRI HEAD WITHOUT CONTRAST  TECHNIQUE: Multiplanar, multiecho pulse sequences of the brain and surrounding structures were obtained without intravenous contrast. COMPARISON:  CT head 08/09/2018.  MRI brain 07/07/2007 FINDINGS: Brain: The diffusion-weighted images demonstrate no acute or subacute infarction. Vascular: Not evaluated without T2 weighted imaging. Skull and upper cervical spine: Craniocervical junction is grossly normal sagittal T1 weighted images are severely degraded by patient motion. Sinuses/Orbits: Paranasal sinuses and mastoid air cells are within normal limits on the sagittal T1 weighted imaging. IMPRESSION: 1. Diffusion-weighted imaging demonstrates no acute or subacute infarction. 2. Motion degraded sagittal T1 weighted imaging was the only other sequence performed without any gross abnormality. Electronically Signed   By: San Morelle M.D.   On: 08/09/2018 21:02   Dg Chest Port 1 View  Result Date: 08/09/2018  CLINICAL DATA:  Weakness with disorientation and fall. History of lung and breast cancer. EXAM: PORTABLE CHEST 1 VIEW COMPARISON:  01/16/2018 FINDINGS: Increased or new densities at the left lung base are most compatible with pleural fluid with consolidation. Haziness in the right hilar region may represent vascular congestion. Cardiac silhouette appears to be enlargement but obscured by the left basilar chest disease. IMPRESSION: Left basilar chest densities. Findings are suggestive for a left pleural effusion with compressive atelectasis or consolidation. Left pleural effusion is probably small to moderate in size. Central vascular congestion. Electronically Signed   By: Markus Daft M.D.   On: 08/09/2018 15:20    (Echo, Carotid, EGD, Colonoscopy, ERCP)    Subjective:   Discharge Exam: Vitals:   08/11/18 2028 08/12/18 0557  BP: (!) 147/71 (!) 159/87  Pulse: 69 97  Resp: 16 20  Temp: 99.3 F (37.4 C) (!) 100.8 F (38.2 C)  SpO2: 100% 99%   Vitals:   08/11/18 1200 08/11/18 1424 08/11/18  2028 08/12/18 0557  BP:  (!) 147/76 (!) 147/71 (!) 159/87  Pulse:  (!) 101 69 97  Resp:  18 16 20   Temp:   99.3 F (37.4 C) (!) 100.8 F (38.2 C)  TempSrc:   Oral Oral  SpO2:  98% 100% 99%  Weight: 61.7 kg     Height: 5' 4"  (1.626 m)       General: Pt is alert, awake, not in acute distress Cardiovascular: RRR, S1/S2 +, no rubs, no gallops Respiratory: CTA bilaterally, no wheezing, no rhonchi Abdominal: Soft, NT, ND, bowel sounds + Extremities: no edema, no cyanosis    The results of significant diagnostics from this hospitalization (including imaging, microbiology, ancillary and laboratory) are listed below for reference.     Microbiology: Recent Results (from the past 240 hour(s))  Urine culture     Status: Abnormal   Collection Time: 08/09/18  4:02 PM  Result Value Ref Range Status   Specimen Description   Final    URINE, RANDOM Performed at [facility] [address] [place], [place] [address]    Special Requests   Final    NONE Performed at [facility], [place] [address], [place], [place] [address]    Culture (A)  Final    <10,000 COLONIES/mL INSIGNIFICANT GROWTH Performed at [facility] [address] [place], [place] [address]    Report Status 08/10/2018 FINAL  Final  MRSA PCR Screening     Status: None   Collection Time: 08/10/18  6:45 AM  Result Value Ref Range Status   MRSA by PCR NEGATIVE NEGATIVE Final    Comment:        The GeneXpert MRSA Assay (FDA approved for NASAL specimens only), is one component of a comprehensive MRSA colonization surveillance program. It is not intended to diagnose MRSA infection nor to guide or monitor treatment for MRSA infections. Performed at [facility], [place] [address], [place], [place] [address]      Labs: BNP (last 3 results) No results for input(s): BNP in the last 8760 hours. Basic Metabolic Panel: Recent Labs  Lab 08/09/18 1519  08/10/18 0053 08/12/18 0318  NA 140 138 133*  K 5.1 4.1 3.6  CL 102 97* 93*  CO2 29 26 28   GLUCOSE 101* 87 78  BUN 17 17 23   CREATININE 0.92 0.99 0.89  CALCIUM 9.7 9.8 8.9   Liver Function Tests: Recent Labs  Lab 08/09/18 1519  AST  45*  ALT 34  ALKPHOS 122  BILITOT 1.2  PROT 8.3*  ALBUMIN 3.8   No results for input(s): LIPASE, AMYLASE in the last 168 hours. Recent Labs  Lab 08/10/18 0053  AMMONIA 25   CBC: Recent Labs  Lab 08/09/18 1519  WBC 7.1  NEUTROABS 5.8  HGB 12.2  HCT 36.5  MCV 92.9  PLT 297   Cardiac Enzymes: No results for input(s): CKTOTAL, CKMB, CKMBINDEX, TROPONINI in the last 168 hours. BNP: Invalid input(s): POCBNP CBG: No results for input(s): GLUCAP in the last 168 hours. D-Dimer No results for input(s): DDIMER in the last 72 hours. Hgb A1c No results for input(s): HGBA1C in the last 72 hours. Lipid Profile No results for input(s): CHOL, HDL, LDLCALC, TRIG, CHOLHDL, LDLDIRECT in the last 72 hours. Thyroid function studies Recent Labs    08/10/18 0053  TSH 2.321   Anemia work up No results for input(s): VITAMINB12, FOLATE, FERRITIN, TIBC, IRON, RETICCTPCT in the last 72 hours. Urinalysis    Component Value Date/Time   COLORURINE STRAW (A) 08/09/2018 1512   APPEARANCEUR CLEAR 08/09/2018 1512   LABSPEC 1.004 (L) 08/09/2018 1512   PHURINE 8.0 08/09/2018 1512   GLUCOSEU NEGATIVE 08/09/2018 1512   HGBUR NEGATIVE 08/09/2018 1512   BILIRUBINUR NEGATIVE 08/09/2018 1512   KETONESUR NEGATIVE 08/09/2018 1512   PROTEINUR NEGATIVE 08/09/2018 1512   NITRITE NEGATIVE 08/09/2018 1512   LEUKOCYTESUR LARGE (A) 08/09/2018 1512   Sepsis Labs Invalid input(s): PROCALCITONIN,  WBC,  LACTICIDVEN Microbiology Recent Results (from the past 240 hour(s))  Urine culture     Status: Abnormal   Collection Time: 08/09/18  4:02 PM  Result Value Ref Range Status   Specimen Description   Final    URINE, RANDOM Performed at [facility], [place] [address], [place], [place] [address]    Special Requests   Final    NONE Performed at [facility], [place] [address] [place], [place] [address]    Culture (A)  Final    <10,000 COLONIES/mL INSIGNIFICANT GROWTH Performed at [facility] [address], [place], [place] [address]    Report Status 08/10/2018 FINAL  Final  MRSA PCR Screening     Status: None   Collection Time: 08/10/18  6:45 AM  Result Value Ref Range Status   MRSA by PCR NEGATIVE NEGATIVE Final    Comment:        The GeneXpert MRSA Assay (FDA approved for NASAL specimens only), is one component of a comprehensive MRSA colonization surveillance program. It is not intended to diagnose MRSA infection nor to guide or monitor treatment for MRSA infections. Performed at [facility], [place] [address], [place], [place] [address]      Time coordinating discharge:  49 minutes  SIGNED:   Georgette Shell, MD  [facility] 08/12/2018, 11:29 AM Pager   If 7PM-7AM, please contact night-coverage [email or web address] Password [email or web address]

## 2018-08-12 NOTE — NC FL2 (Addendum)
[place] LEVEL OF CARE SCREENING TOOL     IDENTIFICATION  Patient Name: [name] Birthdate: 1921-06-09 Sex: female Admission Date (Current Location): 08/09/2018  [facility] and [place] Number:  Herbalist and Address:  [facility],  [place] [place], [place]      Provider Number: [id number]  Attending Physician Name and Address:  Georgette Shell, MD  Relative Name and Phone Number:       Current Level of Care: Hospital Recommended Level of Care: [place] Prior Approval Number:    Date Approved/Denied:   PASRR Number: [id number]  Discharge Plan: SNF    Current Diagnoses: Patient Active Problem List   Diagnosis Date Noted  . Acute encephalopathy 08/09/2018  . Hypertensive urgency 08/09/2018  . Dysuria 08/09/2018  . Pulmonary hypertension, primary ([place]) 07/31/2018  . Pneumonia 07/24/2018  . Congestive heart failure (CHF) ([place]) 07/24/2018  . Yeast vaginitis 07/23/2018  . Chronic respiratory failure with hypoxia ([place]) 06/20/2018  . Neuropathy 06/20/2018  . Paroxysmal atrial fibrillation ([place]) 06/20/2018  . Contusion of foot including toes, right, initial encounter 06/02/2018  . Left lower lobe pneumonia ([place]) 03/31/2018  . Hypokalemia 03/26/2018  . Bloated abdomen 03/21/2018  . Post-nasal drip 01/21/2018  . Iron deficiency 01/21/2018  . Fall 01/09/2018  . Coccyalgia 01/09/2018  . History of GI diverticular bleed 12/25/2017  . Protein-calorie malnutrition ([place]) 12/05/2017  . Advanced care planning/counseling discussion 11/20/2017  . Unsteady gait 11/19/2017  . TSH elevation 11/19/2017  . Permanent atrial fibrillation ([place]) 11/19/2017  . Melena   . Diverticulosis of colon with hemorrhage   . Hematochezia 11/03/2017  . Decrease in appetite 04/03/2017  . Generalized weakness 04/03/2017  . Osteopenia 10/25/2016  . Hyponatremia 07/09/2015  . Hearing loss of both ears 04/08/2014  . HTN  (hypertension) 03/27/2014  . Constipation 03/27/2014  . Skin lesion of face 03/27/2014  . Bronchiectasis ([place])   . Rotator cuff syndrome of right shoulder 09/30/2012  . Breast cancer ([place])   . Hx of radiation therapy   . Lung cancer, lower lobe ([place]) 10/25/2011  . PSVT (paroxysmal supraventricular tachycardia) ([place]) 03/29/2011  . HIP PAIN 05/03/2010  . TOTAL HIP FOLLOW-UP 05/03/2010  . INTERDIGITAL NEUROMA 03/28/2010  . IMPINGEMENT SYNDROME 03/28/2010  . THYROID NODULE, RIGHT 09/06/2008  . HIP, ARTHRITIS, DEGEN./OSTEO 05/03/2008  . LEG EDEMA, BILATERAL 12/22/2007  . PALPITATIONS, RECURRENT 08/27/2007  . Depression with anxiety 07/31/2007  . WEAKNESS, MUSCLE 05/28/2007  . Other specified disorders of adrenal gland ([place]) 05/14/2007  . SOB (shortness of breath) 05/06/2007  . Fatigue 05/01/2007  . Hyperlipidemia 01/28/2007  . Blood loss anemia 01/28/2007  . Allergic rhinitis 01/28/2007  . OVERACTIVE BLADDER 01/28/2007  . Osteoarthritis 01/28/2007  . Pain in lower back 01/28/2007  . Osteoporosis 01/28/2007    Orientation RESPIRATION BLADDER Height & Weight     Self, Situation, Place  O2(1L) Incontinent, External catheter Weight: 136 lb (61.7 kg) Height:  5' 4"  (162.6 cm)  BEHAVIORAL SYMPTOMS/MOOD NEUROLOGICAL BOWEL NUTRITION STATUS      Incontinent Diet(low sodium heart healthy)  AMBULATORY STATUS COMMUNICATION OF NEEDS Skin   Extensive Assist Verbally Normal                       Personal Care Assistance Level of Assistance  Bathing, Feeding, Dressing Bathing Assistance: Maximum assistance Feeding assistance: Independent Dressing Assistance: Maximum assistance     Functional Limitations Info  Sight, Hearing, Speech Sight Info:  Adequate Hearing Info: Adequate Speech Info: Adequate    SPECIAL CARE FACTORS FREQUENCY  PT (By licensed PT), OT (By licensed OT)     PT Frequency: 5x OT Frequency: 5x            Contractures Contractures Info: Not present     Additional Factors Info  Code Status, Allergies Code Status Info: DNR Allergies Info: Augmentin Amoxicillin-pot Clavulanate, Ibuprofen, Naproxen, Statins, Surgilube Gyne-moistrin, Tape           Current Medications (08/12/2018):  This is the current hospital active medication list Current Facility-Administered Medications  Medication Dose Route Frequency Provider Last Rate Last Dose  . 0.45 % sodium chloride infusion   Intravenous Continuous Georgette Shell, MD 75 mL/hr at 08/12/18 0536    . acetaminophen (TYLENOL) tablet 650 mg  650 mg Oral Q6H PRN Opyd, Ilene Qua, MD       Or  . acetaminophen (TYLENOL) suppository 650 mg  650 mg Rectal Q6H PRN Opyd, Ilene Qua, MD      . atenolol (TENORMIN) tablet 25 mg  25 mg Oral BID Georgette Shell, MD   25 mg at 08/12/18 0946  . azithromycin (ZITHROMAX) 500 mg in sodium chloride 0.9 % 250 mL IVPB  500 mg Intravenous Q24H Georgette Shell, MD 250 mL/hr at 08/12/18 1258 500 mg at 08/12/18 1258  . B-complex with vitamin C tablet 1 tablet  1 tablet Oral Daily Opyd, Ilene Qua, MD   1 tablet at 08/10/18 1000  . DULoxetine (CYMBALTA) DR capsule 30 mg  30 mg Oral Daily Opyd, Ilene Qua, MD   30 mg at 08/12/18 0947  . folic acid (FOLVITE) tablet 0.5 mg  500 mcg Oral Daily Opyd, Ilene Qua, MD   0.5 mg at 08/12/18 0947  . gabapentin (NEURONTIN) capsule 100 mg  100 mg Oral BID Opyd, Ilene Qua, MD   100 mg at 08/12/18 0948  . haloperidol lactate (HALDOL) injection 1 mg  1 mg Intravenous Q6H PRN Opyd, Ilene Qua, MD      . hydrALAZINE (APRESOLINE) injection 10 mg  10 mg Intravenous Q4H PRN Opyd, Ilene Qua, MD      . ipratropium-albuterol (DUONEB) 0.5-2.5 (3) MG/3ML nebulizer solution 3 mL  3 mL Nebulization Q6H PRN Opyd, Ilene Qua, MD      . latanoprost (XALATAN) 0.005 % ophthalmic solution 1 drop  1 drop Both Eyes QHS Opyd, Ilene Qua, MD   1 drop at 08/11/18 2108  . lidocaine (LIDODERM) 5 % 1 patch  1 patch Transdermal Daily PRN Opyd, Ilene Qua, MD       . ondansetron (ZOFRAN) tablet 4 mg  4 mg Oral Q6H PRN Opyd, Ilene Qua, MD       Or  . ondansetron (ZOFRAN) injection 4 mg  4 mg Intravenous Q6H PRN Opyd, Ilene Qua, MD      . pantoprazole (PROTONIX) EC tablet 40 mg  40 mg Oral Daily Opyd, Ilene Qua, MD   40 mg at 08/12/18 0948  . polyethylene glycol (MIRALAX / GLYCOLAX) packet 17 g  17 g Oral Daily PRN Opyd, Ilene Qua, MD      . saccharomyces boulardii (FLORASTOR) capsule 250 mg  250 mg Oral BID Georgette Shell, MD      . simethicone [facility]) chewable tablet 80 mg  80 mg Oral QID PRN Opyd, Ilene Qua, MD         Discharge Medications: TAKE these medications   acetaminophen 325 MG tablet Commonly known  as:  TYLENOL Take 650 mg by mouth every 6 (six) hours as needed for mild pain.   atenolol 25 MG tablet Commonly known as:  TENORMIN Take 1 tablet (25 mg total) by mouth 2 (two) times daily. What changed:  when to take this   azithromycin 250 MG tablet Commonly known as:  ZITHROMAX Take 2 tablets (500 mg) on  Day 1,  followed by 1 tablet (250 mg) once daily on Days 2 through 5.   B Complex-B12 Tabs Take 1 tablet by mouth daily.   cetirizine 10 MG tablet Commonly known as:  ZYRTEC Take 10 mg by mouth daily.   DULoxetine 30 MG capsule Commonly known as:  CYMBALTA Take 30 mg by mouth daily.   ferrous gluconate 240 (27 FE) MG tablet Commonly known as:  FERGON Take 240 mg by mouth daily.   folic acid 021 MCG tablet Commonly known as:  FOLVITE Take 400 mcg by mouth daily.   gabapentin 100 MG capsule Commonly known as:  NEURONTIN Take 100 mg by mouth 2 (two) times daily.   ipratropium-albuterol 0.5-2.5 (3) MG/3ML Soln Commonly known as:  DUONEB Take 3 mLs by nebulization every 6 (six) hours as needed.   latanoprost 0.005 % ophthalmic solution Commonly known as:  XALATAN Place 1 drop into both eyes at bedtime.   lidocaine 5 % Commonly known as:  LIDODERM Place 1 patch onto the skin daily as needed.  Remove & Discard patch within 12 hours or as directed by MD   omeprazole 20 MG capsule Commonly known as:  PRILOSEC TAKE 1 CAPSULE EVERY DAY.   polyethylene glycol packet Commonly known as:  MIRALAX / GLYCOLAX Take 17 g by mouth daily as needed.   PRESERVISION AREDS 2 PO Take 1 capsule by mouth 2 (two) times daily.   saccharomyces boulardii 250 MG capsule Commonly known as:  FLORASTOR Take 250 mg by mouth as needed (same day as any Antibiotic). What changed:  Another medication with the same name was added. Make sure you understand how and when to take each.   saccharomyces boulardii 250 MG capsule Commonly known as:  FLORASTOR Take 1 capsule (250 mg total) by mouth 2 (two) times daily. What changed:  You were already taking a medication with the same name, and this prescription was added. Make sure you understand how and when to take each.   Simethicone 80 MG Tabs Take 1 tablet by mouth 4 (four) times daily as needed.       Relevant Imaging Results:  Relevant Lab Results:   Additional Information [id number]  Nila Nephew, LCSW

## 2018-08-12 NOTE — Progress Notes (Signed)
Pt admitting to [facility] at [place]- report # [id number]. (pt resides at [facility] but SNF building there has no available beds- [facility] has availability and obtained [facility] [facility] authorization. - Pt and son agreeable) Provided DC information via the HUB and arranged [facility] transportation.  Sharren Bridge, MSW, LCSW Clinical Social Work 08/12/2018 [phone]

## 2018-08-13 ENCOUNTER — Telehealth: Payer: Self-pay

## 2018-08-13 ENCOUNTER — Non-Acute Institutional Stay (SKILLED_NURSING_FACILITY): Payer: Medicare HMO | Admitting: Internal Medicine

## 2018-08-13 ENCOUNTER — Encounter: Payer: Self-pay | Admitting: Internal Medicine

## 2018-08-13 DIAGNOSIS — G9340 Encephalopathy, unspecified: Secondary | ICD-10-CM

## 2018-08-13 DIAGNOSIS — J9611 Chronic respiratory failure with hypoxia: Secondary | ICD-10-CM

## 2018-08-13 DIAGNOSIS — I5032 Chronic diastolic (congestive) heart failure: Secondary | ICD-10-CM | POA: Diagnosis not present

## 2018-08-13 DIAGNOSIS — H903 Sensorineural hearing loss, bilateral: Secondary | ICD-10-CM | POA: Diagnosis not present

## 2018-08-13 DIAGNOSIS — R531 Weakness: Secondary | ICD-10-CM

## 2018-08-13 DIAGNOSIS — E871 Hypo-osmolality and hyponatremia: Secondary | ICD-10-CM

## 2018-08-13 DIAGNOSIS — I10 Essential (primary) hypertension: Secondary | ICD-10-CM | POA: Diagnosis not present

## 2018-08-13 DIAGNOSIS — R69 Illness, unspecified: Secondary | ICD-10-CM | POA: Diagnosis not present

## 2018-08-13 DIAGNOSIS — I480 Paroxysmal atrial fibrillation: Secondary | ICD-10-CM | POA: Diagnosis not present

## 2018-08-13 DIAGNOSIS — E611 Iron deficiency: Secondary | ICD-10-CM

## 2018-08-13 DIAGNOSIS — J181 Lobar pneumonia, unspecified organism: Secondary | ICD-10-CM

## 2018-08-13 DIAGNOSIS — F418 Other specified anxiety disorders: Secondary | ICD-10-CM

## 2018-08-13 DIAGNOSIS — E43 Unspecified severe protein-calorie malnutrition: Secondary | ICD-10-CM

## 2018-08-13 DIAGNOSIS — J189 Pneumonia, unspecified organism: Secondary | ICD-10-CM

## 2018-08-13 NOTE — Telephone Encounter (Signed)
Possible re-admission to facility. This is a patient you were seeing at [facility] . [facility] F/U is needed if patient was re-admitted to facility upon discharge. Hospital discharge from [facility] on 08/12/2018

## 2018-08-13 NOTE — Progress Notes (Signed)
Provider:  Blanchie Serve MD  Location:  [facility] Room Number: 80 Place of Service:  SNF (31)  PCP: Blanchie Serve, MD Patient Care Team: Blanchie Serve, MD as PCP - General (Internal Medicine) Lanelle Bal., MD as Referring Physician (Internal Medicine) Satira Sark, MD as Consulting Physician (Cardiology) Kyung Rudd, MD as Consulting Physician (Radiation Oncology)  Extended Emergency Contact Information Primary Emergency Contact: Neita Garnet of [place] Phone: [phone] Relation: Son Secondary Emergency Contact: Jury,Lance Address: [address]          [place], [place] [address] [name] of [place] Phone: [phone] Mobile Phone: [phone] Relation: Son  Code Status: DNR  Goals of Care: Advanced Directive information Advanced Directives 08/13/2018  Does Patient Have a Medical Advance Directive? Yes  Type of Advance Directive Living will;Out of facility DNR (pink MOST or yellow form)  Does patient want to make changes to medical advance directive? No - Patient declined  Copy of [place] in Chart? -  Would patient like information on creating a medical advance directive? -  Pre-existing out of facility DNR order (yellow form or pink MOST form) Pink MOST form placed in chart (order not valid for inpatient use)      Chief Complaint  Patient presents with  . New Admit To SNF    New Admission Visit     HPI: Patient is a 84 y.o. female seen today for admission visit. She was in the hospital from 08/09/18-08/12/18 post fall with increased confusion and weakness. She was having increased urinary frequency and dysuria and there were concern for UTI. Urine study was sent and she was placed on iv antibiotics. She underwent CT scan and MRI brain that ruled out acute intracranial abnormality. Chest xray showed left basilar density concerning for atelectasis vs consolidation, left pleural  effusion and central vascular congestion. She was given lasix. She remained afebrile and her iv antibiotic was discontinued. She was empirically placed on azithromycin at discharge for possible left lower lobe pneumonia. She was seen by cardiology service with concern for pulmonary hypertension. She underwent echocardiogram and the thought was that chronic diastolic CHF was contributing to her dyspnea and medical management was recommended. With etiology for her acute encephalopathy not being clear, there was concern that being on lasix and remeron could have contributed some with dehydration. These medications were discontinued. She has medical history of hypertension, depression, allergic rhinitis and anxiety among others. She is seen in her room today with her daughter in law at bedside. She is not at her baseline. She feels weak and tired. Per nursing, she is requiring 2 person maximum assistance with transfer. Patient has some confusion.   Past Medical History:  Diagnosis Date  . Allergic rhinitis   . Anemia    NOS  . Anxiety   . Biceps tendon rupture    Bilateral  . Breast cancer ([place]) 2001   Left s/p lumpectomy and XRT   . Bronchiectasis ([place]) 2014   Noted on chest x-ray  . Complication of anesthesia    slow to wake up  . Cough 2014  . Depression   . Diverticulosis   . Elevated liver enzymes    Biopsy consistent with steatohepatitis  . Fundic gland polyps of stomach, benign   . GERD (gastroesophageal reflux disease) 06/2007   EGD Dr Gala Romney >sm HH, multiple fundic gland polyps  . Glaucoma 2013   both eyes  . Hemorrhoids   .  Hiatal hernia   . Hx of radiation therapy 07/17/10 to 07/21/10   SRS LLL lung  . Hyperlipidemia   . Insomnia   . Low back pain    scoliosis  . Lung cancer ([facility]) 05/2010   Left 2011 - rad x 3  . Lymphedema    Left arm  . Osteoarthritis   . Osteopenia 10/25/2016  . Osteoporosis, senile   . Overactive bladder   . Pneumonia    RLL with sepsis  . PSVT  (paroxysmal supraventricular tachycardia) ([facility])   . Rheumatic fever    Age 61  . Right thyroid nodule   . Steatohepatitis    liver bx  . Type 2 diabetes mellitus ([place])    Past Surgical History:  Procedure Laterality Date  . ABDOMINAL HYSTERECTOMY  1964  . APPENDECTOMY  1964  . BIOPSY THYROID  11/2008  . BREAST BIOPSY     X 3 w/ cystectomy  . BREAST LUMPECTOMY  2001  . CATARACT EXTRACTION Bilateral 2003   Lens implant Dr. Charise Killian  . CHOLECYSTECTOMY  1965  . COLONOSCOPY  09/11/2011   Procedure: COLONOSCOPY;  Surgeon: Dorothyann Peng, MD;  [facility];  Service: Endoscopy;  Laterality: N/A;  . COLONOSCOPY  2005 /2012   Dr. Lucianne Muss- diverticulosis, ext hemorrhoids.  . COLONOSCOPY WITH PROPOFOL N/A 11/09/2017   Procedure: COLONOSCOPY WITH PROPOFOL;  Surgeon: Mauri Pole, MD;  [facility]: [facility];  Service: Endoscopy;  Laterality: N/A;  . COLONOSCOPY WITH PROPOFOL N/A 11/12/2017   Procedure: COLONOSCOPY WITH PROPOFOL;  Surgeon: Ladene Artist, MD;  Location[facility];  Service: Endoscopy;  Laterality: N/A;  . CYSTOSCOPY  2007  . ESOPHAGOGASTRODUODENOSCOPY  09/11/2011   Procedure: ESOPHAGOGASTRODUODENOSCOPY (EGD);  Surgeon: Dorothyann Peng, MD;  [facility];  Service: Endoscopy;  Laterality: N/A;  . ESOPHAGOGASTRODUODENOSCOPY (EGD) WITH PROPOFOL N/A 11/08/2017   Procedure: ESOPHAGOGASTRODUODENOSCOPY (EGD) WITH PROPOFOL;  Surgeon: Mauri Pole, MD;  [facility];  Service: Endoscopy;  Laterality: N/A;  . LIVER BIOPSY  2008  . LUNG BIOPSY Left 06/06/2010  . REVISION TOTAL HIP ARTHROPLASTY  2007   Left  . SKIN CANCER EXCISION     nose and left lower cheek  . TONSILLECTOMY  1930  . TOTAL HIP ARTHROPLASTY Bilateral 2005 & 2007    Dr. Earl Lagos. Aline Brochure     reports that she has never smoked. She has never used smokeless tobacco. She reports that she does not drink alcohol or use drugs. Social History   Socioeconomic History  .  Marital status: Widowed    Spouse name: Not on file  . Number of children: 2  . Years of education: college   . Highest education level: Not on file  Occupational History  . Occupation: retired Tax Comptroller: RETIRED  Social Needs  . Financial resource strain: Not on file  . Food insecurity:    Worry: Not on file    Inability: Not on file  . Transportation needs:    Medical: Not on file    Non-medical: Not on file  Tobacco Use  . Smoking status: Never Smoker  . Smokeless tobacco: Never Used  . Tobacco comment: 2nd hand-husband  Substance and Sexual Activity  . Alcohol use: No  . Drug use: No  . Sexual activity: Not Currently  Lifestyle  . Physical activity:    Days per week: Not on file    Minutes per session: Not on file  . Stress: Not  on file  Relationships  . Social connections:    Talks on phone: Not on file    Gets together: Not on file    Attends religious service: Not on file    Active member of club or organization: Not on file    Attends meetings of clubs or organizations: Not on file    Relationship status: Not on file  . Intimate partner violence:    Fear of current or ex partner: Not on file    Emotionally abused: Not on file    Physically abused: Not on file    Forced sexual activity: Not on file  Other Topics Concern  . Not on file  Social History Narrative   Lives at [facility] 02/2013   2 adopted children   [place] in [place]   Widowed 2013   Walks with walker   Exercise: none    POA - Living Will    Functional Status Survey:    Family History  Problem Relation Age of Onset  . Heart failure Mother   . Osteoporosis Mother   . Hypertension Father   . Stroke Father   . Heart failure Father   . Leukemia Brother   . Heart failure Brother   . Cancer Maternal Grandmother   . Stroke Maternal Grandfather   . Cancer Unknown     Health Maintenance  Topic Date Due  . FOOT EXAM  07/19/1931  . OPHTHALMOLOGY EXAM   07/19/1931  . PNA vac Low Risk Adult (2 of 2 - PCV13) 08/18/2007  . URINE MICROALBUMIN  04/30/2008  . TETANUS/TDAP  12/18/2015  . HEMOGLOBIN A1C  10/17/2017  . INFLUENZA VACCINE  07/17/2018  . DEXA SCAN  Completed    Allergies  Allergen Reactions  . Augmentin [Amoxicillin-Pot Clavulanate] Other (See Comments)    unknown  . Ibuprofen Hives  . Naproxen Other (See Comments)    unknown  . Statins Other (See Comments)    Feels like knives in stomach  . Surgilube [Gyne-Moistrin]     Rectal itching, burning  . Tape Rash    Outpatient Encounter Medications as of 08/13/2018  Medication Sig  . acetaminophen (TYLENOL) 325 MG tablet Take 650 mg by mouth every 6 (six) hours as needed for mild pain.   Marland Kitchen atenolol (TENORMIN) 25 MG tablet Take 1 tablet (25 mg total) by mouth 2 (two) times daily.  Marland Kitchen azithromycin (ZITHROMAX Z-PAK) 250 MG tablet Take 2 tablets (500 mg) on  Day 1,  followed by 1 tablet (250 mg) once daily on Days 2 through 5.  . B Complex Vitamins (B COMPLEX-B12) TABS Take 1 tablet by mouth daily.  . cetirizine (ZYRTEC) 10 MG tablet Take 10 mg by mouth daily.  . DULoxetine (CYMBALTA) 30 MG capsule Take 30 mg by mouth daily.  . ferrous gluconate (FERGON) 240 (27 FE) MG tablet Take 240 mg by mouth daily.  . folic acid (FOLVITE) 349 MCG tablet Take 400 mcg by mouth daily.  Marland Kitchen gabapentin (NEURONTIN) 100 MG capsule Take 100 mg by mouth 2 (two) times daily.  Marland Kitchen ipratropium-albuterol (DUONEB) 0.5-2.5 (3) MG/3ML SOLN Take 3 mLs by nebulization every 6 (six) hours as needed.  . latanoprost (XALATAN) 0.005 % ophthalmic solution Place 1 drop into both eyes at bedtime.  . lidocaine (LIDODERM) 5 % Place 1 patch onto the skin daily as needed. Remove & Discard patch within 12 hours or as directed by MD   . Multiple Vitamins-Minerals (PRESERVISION AREDS 2 PO)  Take 1 capsule by mouth 2 (two) times daily.   Marland Kitchen omeprazole (PRILOSEC) 20 MG capsule TAKE 1 CAPSULE EVERY DAY.  Marland Kitchen OXYGEN Inhale 1 L into the  lungs continuous.  . polyethylene glycol (MIRALAX / GLYCOLAX) packet Take 17 g by mouth daily as needed.   . saccharomyces boulardii (FLORASTOR) 250 MG capsule Take 250 mg by mouth 2 (two) times daily. Stop date 08/22/18  . Simethicone 80 MG TABS Take 1 tablet by mouth 4 (four) times daily as needed.  . zinc oxide 20 % ointment Apply 1 application topically 3 (three) times daily as needed for irritation.  . [DISCONTINUED] ipratropium-albuterol (DUONEB) 0.5-2.5 (3) MG/3ML SOLN Take 3 mLs by nebulization every 6 (six) hours as needed.  . [DISCONTINUED] saccharomyces boulardii (FLORASTOR) 250 MG capsule Take 1 capsule (250 mg total) by mouth 2 (two) times daily.   No facility-administered encounter medications on file as of 08/13/2018.     Review of Systems  Constitutional: Positive for appetite change and fatigue. Negative for chills and fever.  HENT: Positive for hearing loss and rhinorrhea. Negative for congestion, mouth sores, postnasal drip, sinus pain and trouble swallowing.   Eyes: Positive for visual disturbance. Negative for pain and itching.  Respiratory: Negative for cough, shortness of breath and wheezing.   Cardiovascular: Negative for chest pain and palpitations.  Gastrointestinal: Negative for abdominal pain, blood in stool, constipation, diarrhea, nausea and vomiting.       Early satiety. Had a bowel movement this am.  Genitourinary: Positive for frequency. Negative for dysuria, flank pain and hematuria.  Musculoskeletal: Positive for gait problem.  Skin: Negative for rash.  Neurological: Positive for weakness. Negative for dizziness, tremors and headaches.  Psychiatric/Behavioral: Positive for confusion and dysphoric mood.    Vitals:   08/13/18 1040  BP: (!) 143/80  Pulse: 87  Resp: 16  Temp: 98.1 F (36.7 C)  TempSrc: Oral  SpO2: 91%  Weight: 136 lb (61.7 kg)  Height: 5' 4"  (1.626 m)   Body mass index is 23.34 kg/m.   Wt Readings from Last 3 Encounters:  08/13/18  136 lb (61.7 kg)  08/11/18 136 lb (61.7 kg)  07/30/18 141 lb 9.6 oz (64.2 kg)   Physical Exam  Constitutional: She is oriented to person, place, and time. No distress.  Frail, thin built elderly female  HENT:  Head: Normocephalic and atraumatic.  Mouth/Throat: Oropharynx is clear and moist.  Has hearing aids  Eyes: Conjunctivae and EOM are normal. Right eye exhibits no discharge. Left eye exhibits no discharge.  Has corrective glasses  Neck: Neck supple.  Cardiovascular: Normal rate and regular rhythm.  Pulmonary/Chest: Effort normal. She has no wheezes. She has no rales.  Poor air movement, oxygen 1 liter by nasal canula  Abdominal: Soft. Bowel sounds are normal. There is no tenderness. There is no guarding.  Musculoskeletal: Normal range of motion.  Generalized weakness, unsteady gait, 2 person maximum assist with transfer, trace leg edema  Lymphadenopathy:    She has no cervical adenopathy.  Neurological: She is alert and oriented to person, place, and time. She exhibits normal muscle tone.  Skin: Skin is warm and dry. Capillary refill takes less than 2 seconds. She is not diaphoretic.  Psychiatric:  Confused, needs redirection and repetition of questions    Labs reviewed: Basic Metabolic Panel: Recent Labs    08/09/18 1519 08/10/18 0053 08/12/18 0318  NA 140 138 133*  K 5.1 4.1 3.6  CL 102 97* 93*  CO2 29 26  28  GLUCOSE 101* 87 78  BUN 17 17 23   CREATININE 0.92 0.99 0.89  CALCIUM 9.7 9.8 8.9   Liver Function Tests: Recent Labs    11/03/17 1818  12/24/17 0700  03/25/18 07/24/18 08/09/18 1519  AST 20   < > 17  17   < > 18 26 45*  ALT 13*   < > 12  12   < > 13 17 34  ALKPHOS 78   < >  --    < > 133* 105 122  BILITOT 0.4  --  0.4  0.4  --   --   --  1.2  PROT 6.5  --  7.2  7.2  --   --  6.5 8.3*  ALBUMIN 3.1*  --   --   --   --  3.4 3.8   < > = values in this interval not displayed.   No results for input(s): LIPASE, AMYLASE in the last 8760  hours. Recent Labs    08/10/18 0053  AMMONIA 25   CBC: Recent Labs    12/24/17 0700 01/16/18 0842  04/24/18 07/24/18 08/09/18 1519  WBC 6.5 10.3   < > 7.8 6.0 7.1  NEUTROABS  --   --   --   --   --  5.8  HGB 10.7* 11.0*   < > 9.6* 10.4* 12.2  HCT 31.9* 32.7*   < > 29* 30* 36.5  MCV 86.4 88.4  --   --   --  92.9  PLT 389 278   < > 346 210 297   < > = values in this interval not displayed.   Cardiac Enzymes: No results for input(s): CKTOTAL, CKMB, CKMBINDEX, TROPONINI in the last 8760 hours. BNP: Invalid input(s): POCBNP Lab Results  Component Value Date   HGBA1C 5.3 04/16/2017   Lab Results  Component Value Date   TSH 2.321 08/10/2018   Lab Results  Component Value Date   VITAMINB12 546 09/10/2011   Lab Results  Component Value Date   FOLATE >20.0 09/10/2011   Lab Results  Component Value Date   IRON 36 (L) 12/24/2017   TIBC 248 (L) 09/10/2011   FERRITIN 251 12/24/2017    Imaging and Procedures obtained prior to SNF admission: Ct Head Wo Contrast  Result Date: 08/09/2018 CLINICAL DATA:  Confusion. EXAM: CT HEAD WITHOUT CONTRAST TECHNIQUE: Contiguous axial images were obtained from the base of the skull through the vertex without intravenous contrast. COMPARISON:  None. FINDINGS: Brain: No subdural, epidural, or subarachnoid hemorrhage. Cerebellum, brainstem, and basal cisterns are normal. No mass effect or midline shift. Moderate white matter changes are noted. No acute cortical ischemia or infarct. No mass effect or midline shift. Ventricles and sulci are prominent but otherwise unremarkable. Vascular: No hyperdense vessel or unexpected calcification. Skull: Normal. Negative for fracture or focal lesion. Sinuses/Orbits: No acute finding. Other: None. IMPRESSION: Chronic white matter changes.  No acute abnormalities noted. Electronically Signed   By: Dorise Bullion III M.D   On: 08/09/2018 16:54   Mr Brain Wo Contrast  Result Date: 08/09/2018 CLINICAL DATA:   Altered mental status. Possible aphasia. Recent pneumonia. Decreased responsiveness. The examination had to be discontinued prior to completion due to patient noncompliance. EXAM: MRI HEAD WITHOUT CONTRAST TECHNIQUE: Multiplanar, multiecho pulse sequences of the brain and surrounding structures were obtained without intravenous contrast. COMPARISON:  CT head 08/09/2018.  MRI brain 07/07/2007 FINDINGS: Brain: The diffusion-weighted images demonstrate no acute or subacute infarction. Vascular:  Not evaluated without T2 weighted imaging. Skull and upper cervical spine: Craniocervical junction is grossly normal sagittal T1 weighted images are severely degraded by patient motion. Sinuses/Orbits: Paranasal sinuses and mastoid air cells are within normal limits on the sagittal T1 weighted imaging. IMPRESSION: 1. Diffusion-weighted imaging demonstrates no acute or subacute infarction. 2. Motion degraded sagittal T1 weighted imaging was the only other sequence performed without any gross abnormality. Electronically Signed   By: San Morelle M.D.   On: 08/09/2018 21:02   Dg Chest Port 1 View  Result Date: 08/09/2018 CLINICAL DATA:  Weakness with disorientation and fall. History of lung and breast cancer. EXAM: PORTABLE CHEST 1 VIEW COMPARISON:  01/16/2018 FINDINGS: Increased or new densities at the left lung base are most compatible with pleural fluid with consolidation. Haziness in the right hilar region may represent vascular congestion. Cardiac silhouette appears to be enlargement but obscured by the left basilar chest disease. IMPRESSION: Left basilar chest densities. Findings are suggestive for a left pleural effusion with compressive atelectasis or consolidation. Left pleural effusion is probably small to moderate in size. Central vascular congestion. Electronically Signed   By: Markus Daft M.D.   On: 08/09/2018 15:20   08/09/18 EKG- NSR 77 BPM, LAD, LVH, PAC  08/11/18 echocardiogram- normal LV size, mild  LVH, EF 26-33%, grade 1 diastolic dysfunction with elevated LVEDP, trace AR, mild MR   Assessment/Plan  1. Generalized weakness Will have her work with physical therapy and occupational therapy team to help with gait training and muscle strengthening exercises.fall precautions. Skin care. Encourage to be out of bed.   2. Acute encephalopathy Unclear of the etiology, continue and complete course of antibiotic for pneumonia. Check cbc with diff, cmp 08/18/18. SLP consult.   3. Pneumonia of left lower lobe due to infectious organism [facility]) Continue and complete course of azithromycin. Add incentive spirometer to prevent atelectasis. Continue o2 by nasal canula. Continue duoneb and give it twice a day and prn in between  4. Hyponatremia Maintain hydration, check bmp  5. Essential hypertension Monitor BP. Continue atenolol, check bmp  6. Paroxysmal atrial fibrillation (HCC) Controlled HR. Continue atenolol 25 mg bid.  7. Chronic diastolic CHF (congestive heart failure) (HCC) Continue atenolol and monitor for now. Off lasix.   8. Chronic respiratory failure with hypoxia (HCC) Continue oxygen by nasal canula and zyrtec with bronchodilator.   9. Sensorineural hearing loss (SNHL) of both ears Continue hearing aid and supportive care  10. Depression with anxiety Off remeron, continue duloxetine for now.   11. Severe protein-calorie malnutrition (HCC) Monitor and encourage po intake. RD consult  12. Iron deficiency Continue ferrous gluconate. Monitor cbc   Family/ staff Communication: reviewed care plan with patient, her daughter in law and charge nurse.    Labs/tests ordered: cbc with diff, cmp  Blanchie Serve, MD Internal Medicine [facility] [address] [place], [place] [address] Cell Phone (Monday-Friday 8 am - 5 pm): [phone] On Call: [phone] and follow prompts after 5 pm and on weekends Office Phone: [phone] Office Fax:  [phone]

## 2018-08-19 ENCOUNTER — Non-Acute Institutional Stay (SKILLED_NURSING_FACILITY): Payer: Medicare HMO | Admitting: Family

## 2018-08-19 ENCOUNTER — Encounter: Payer: Self-pay | Admitting: Family

## 2018-08-19 DIAGNOSIS — D649 Anemia, unspecified: Secondary | ICD-10-CM | POA: Diagnosis not present

## 2018-08-19 DIAGNOSIS — I110 Hypertensive heart disease with heart failure: Secondary | ICD-10-CM | POA: Diagnosis not present

## 2018-08-19 DIAGNOSIS — G8929 Other chronic pain: Secondary | ICD-10-CM

## 2018-08-19 DIAGNOSIS — M25561 Pain in right knee: Secondary | ICD-10-CM | POA: Diagnosis not present

## 2018-08-19 DIAGNOSIS — R1084 Generalized abdominal pain: Secondary | ICD-10-CM

## 2018-08-19 DIAGNOSIS — M25562 Pain in left knee: Secondary | ICD-10-CM | POA: Diagnosis not present

## 2018-08-19 DIAGNOSIS — I272 Pulmonary hypertension, unspecified: Secondary | ICD-10-CM | POA: Diagnosis not present

## 2018-08-19 LAB — COMPLETE METABOLIC PANEL WITH GFR
ALK PHOS: 122
ALT: 58
AST: 58
Albumin: 2.8
BILIRUBIN TOTAL: 0.8
BUN: 21 (ref 4–21)
Calcium: 8.8
Creat: 0.82
Glucose: 83
POTASSIUM: 3.5
SODIUM: 128
TOTAL PROTEIN: 6.4 g/dL

## 2018-08-19 LAB — CBC
HCT: 35.3
HGB: 11.6
MCV: 93.6
WBC: 9.8
platelet count: 291

## 2018-08-19 NOTE — Progress Notes (Signed)
Location:  [facility] Room Number: 31 Place of Service:  SNF (31) Provider: Kenlynn Houde FNP-C  Blanchie Serve, MD  Patient Care Team: Blanchie Serve, MD as PCP - General (Internal Medicine) Lanelle Bal., MD as Referring Physician (Internal Medicine) Satira Sark, MD as Consulting Physician (Cardiology) Kyung Rudd, MD as Consulting Physician (Radiation Oncology)  Extended Emergency Contact Information Primary Emergency Contact: Neita Garnet of [place] Phone: [phone] Relation: Son Secondary Emergency Contact: Valdez,Lance Address: [address]          [place], [place] [address] [name] of [place] Phone: [phone] Mobile Phone: [phone] Relation: Son  Code Status:  DNR Goals of care: Advanced Directive information Advanced Directives 08/19/2018  Does Patient Have a Medical Advance Directive? Yes  Type of Advance Directive Living will;Out of facility DNR (pink MOST or yellow form)  Does patient want to make changes to medical advance directive? -  Copy of [place] in Chart? -  Would patient like information on creating a medical advance directive? -  Pre-existing out of facility DNR order (yellow form or pink MOST form) Pink MOST form placed in chart (order not valid for inpatient use)     Chief Complaint  Patient presents with  . Acute Visit    Knee pain    HPI:  Pt is a 84 y.o. female seen today at [facility] for an acute visit for evaluation of knee pain.she is seen in her room today.Physical therapist states patient complained of knee pain during therapy unable to participate with exercise due to pain on both knees.she denies any tenderness,sweeling or redness on the knees.of note she has a medical history of Osteoarthritis.  Nurse states patient takes tylenol as needed.she states tylenol takes the " edge off". Nurse also states patient's son called to notify staff that  patient complained of abdominal pain yesterday.Patient denied any abdominal pain,nausea,vomiting during visit.No fever or chills reported.    Past Medical History:  Diagnosis Date  . Allergic rhinitis   . Anemia    NOS  . Anxiety   . Biceps tendon rupture    Bilateral  . Breast cancer ([place]) 2001   Left s/p lumpectomy and XRT   . Bronchiectasis ([place]) 2014   Noted on chest x-ray  . Complication of anesthesia    slow to wake up  . Cough 2014  . Depression   . Diverticulosis   . Elevated liver enzymes    Biopsy consistent with steatohepatitis  . Fundic gland polyps of stomach, benign   . GERD (gastroesophageal reflux disease) 06/2007   EGD Dr Gala Romney >sm HH, multiple fundic gland polyps  . Glaucoma 2013   both eyes  . Hemorrhoids   . Hiatal hernia   . Hx of radiation therapy 07/17/10 to 07/21/10   SRS LLL lung  . Hyperlipidemia   . Insomnia   . Low back pain    scoliosis  . Lung cancer ([place]) 05/2010   Left 2011 - rad x 3  . Lymphedema    Left arm  . Osteoarthritis   . Osteopenia 10/25/2016  . Osteoporosis, senile   . Overactive bladder   . Pneumonia    RLL with sepsis  . PSVT (paroxysmal supraventricular tachycardia) ([place])   . Rheumatic fever    Age 29  . Right thyroid nodule   . Steatohepatitis    liver bx  . Type 2 diabetes mellitus ([facility])    Past  Surgical History:  Procedure Laterality Date  . ABDOMINAL HYSTERECTOMY  1964  . APPENDECTOMY  1964  . BIOPSY THYROID  11/2008  . BREAST BIOPSY     X 3 w/ cystectomy  . BREAST LUMPECTOMY  2001  . CATARACT EXTRACTION Bilateral 2003   Lens implant Dr. Charise Killian  . CHOLECYSTECTOMY  1965  . COLONOSCOPY  09/11/2011   Procedure: COLONOSCOPY;  Surgeon: Dorothyann Peng, MD;  Location: [facility];  Service: Endoscopy;  Laterality: N/A;  . COLONOSCOPY  2005 /2012   Dr. Lucianne Muss- diverticulosis, ext hemorrhoids.  . COLONOSCOPY WITH PROPOFOL N/A 11/09/2017   Procedure: COLONOSCOPY WITH PROPOFOL;  Surgeon: Mauri Pole, MD;   Location: [facility] ENDOS[facility];  Service: Endoscopy;  Laterality: N/A;  . COLONOSCOPY WITH PROPOFOL N/A 11/12/2017   Procedure: COLONOSCOPY WITH PROPOFOL;  Surgeon: Ladene Artist, MD;  Location: [facility] ENDOSCOPY;  Service: Endoscopy;  Laterality: N/A;  . CYSTOSCOPY  2007  . ESOPHAGOGASTRODUODENOSCOPY  09/11/2011   Procedure: ESOPHAGOGASTRODUODENOSCOPY (EGD);  Surgeon: Dorothyann Peng, MD;  Location: [facility];  Service: Endoscopy;  Laterality: N/A;  . ESOPHAGOGASTRODUODENOSCOPY (EGD) WITH PROPOFOL N/A 11/08/2017   Procedure: ESOPHAGOGASTRODUODENOSCOPY (EGD) WITH PROPOFOL;  Surgeon: Mauri Pole, MD;  Location: [facility];  Service: Endoscopy;  Laterality: N/A;  . LIVER BIOPSY  2008  . LUNG BIOPSY Left 06/06/2010  . REVISION TOTAL HIP ARTHROPLASTY  2007   Left  . SKIN CANCER EXCISION     nose and left lower cheek  . TONSILLECTOMY  1930  . TOTAL HIP ARTHROPLASTY Bilateral 2005 & 2007    Dr. Earl Lagos. Aline Brochure     Allergies  Allergen Reactions  . Augmentin [Amoxicillin-Pot Clavulanate] Other (See Comments)    unknown  . Ibuprofen Hives  . Naproxen Other (See Comments)    unknown  . Statins Other (See Comments)    Feels like knives in stomach  . Surgilube [Gyne-Moistrin]     Rectal itching, burning  . Tape Rash    Outpatient Encounter Medications as of 08/19/2018  Medication Sig  . acetaminophen (TYLENOL) 325 MG tablet Take 650 mg by mouth every 6 (six) hours as needed for mild pain.   Marland Kitchen atenolol (TENORMIN) 25 MG tablet Take 1 tablet (25 mg total) by mouth 2 (two) times daily.  . B Complex Vitamins (B COMPLEX-B12) TABS Take 1 tablet by mouth daily.  . cetirizine (ZYRTEC) 10 MG tablet Take 10 mg by mouth daily.  . DULoxetine (CYMBALTA) 30 MG capsule Take 30 mg by mouth daily.  . ferrous gluconate (FERGON) 240 (27 FE) MG tablet Take 240 mg by mouth daily.  . folic acid (FOLVITE) 923 MCG tablet Take 400 mcg by mouth daily.  Marland Kitchen gabapentin (NEURONTIN) 100 MG capsule Take 100 mg by  mouth 2 (two) times daily.  Marland Kitchen ipratropium-albuterol (DUONEB) 0.5-2.5 (3) MG/3ML SOLN Take 3 mLs by nebulization every 6 (six) hours as needed.  . latanoprost (XALATAN) 0.005 % ophthalmic solution Place 1 drop into both eyes at bedtime.  . lidocaine (LIDODERM) 5 % Place 1 patch onto the skin daily as needed. Remove & Discard patch within 12 hours or as directed by MD   . Multiple Vitamins-Minerals (PRESERVISION AREDS 2 PO) Take 1 capsule by mouth 2 (two) times daily.   Marland Kitchen omeprazole (PRILOSEC) 20 MG capsule TAKE 1 CAPSULE EVERY DAY.  Marland Kitchen OXYGEN Inhale 1 L into the lungs continuous.  . polyethylene glycol (MIRALAX / GLYCOLAX) packet Take 17 g by mouth daily as needed.   Marland Kitchen  saccharomyces boulardii (FLORASTOR) 250 MG capsule Take 250 mg by mouth 2 (two) times daily. Stop date 08/22/18  . Simethicone 80 MG TABS Take 1 tablet by mouth 4 (four) times daily as needed.  . zinc oxide 20 % ointment Apply 1 application topically 3 (three) times daily as needed for irritation.   No facility-administered encounter medications on file as of 08/19/2018.     Review of Systems  Constitutional: Negative for chills, fatigue and fever.  Respiratory: Negative for cough, chest tightness, shortness of breath and wheezing.        Oxygen 1 liter via nasal cannula   Cardiovascular: Positive for leg swelling. Negative for chest pain and palpitations.  Gastrointestinal: Negative for abdominal distention, abdominal pain, constipation, diarrhea, nausea and vomiting.  Genitourinary: Negative for dysuria, flank pain and urgency.  Musculoskeletal: Positive for arthralgias and gait problem.  Skin: Negative for color change, pallor and rash.  Neurological: Negative for dizziness, light-headedness and headaches.  Hematological: Does not bruise/bleed easily.  Psychiatric/Behavioral: Negative for agitation, confusion and sleep disturbance. The patient is not nervous/anxious.     Immunization History  Administered Date(s)  Administered  . Influenza Whole 09/22/2008, 09/16/2012, 09/30/2013  . Influenza, High Dose Seasonal PF 09/25/2017  . Influenza-Unspecified 10/02/2014, 09/15/2015, 09/27/2016  . Pneumococcal Polysaccharide-23 08/17/2006  . Td 12/17/2005   Pertinent  Health Maintenance Due  Topic Date Due  . FOOT EXAM  07/19/1931  . OPHTHALMOLOGY EXAM  07/19/1931  . PNA vac Low Risk Adult (2 of 2 - PCV13) 08/18/2007  . URINE MICROALBUMIN  04/30/2008  . HEMOGLOBIN A1C  10/17/2017  . INFLUENZA VACCINE  07/17/2018  . DEXA SCAN  Completed   Fall Risk  09/19/2017 05/17/2016 04/20/2016 04/19/2016  Falls in the past year? No No No No    Vitals:   08/19/18 1343  BP: 106/64  Pulse: 67  Resp: 18  Temp: 98.3 F (36.8 C)  SpO2: 91%  Weight: 134 lb 6.4 oz (61 kg)  Height: 5' 4"  (1.626 m)   Body mass index is 23.07 kg/m. Physical Exam  Constitutional: She appears well-developed and well-nourished. No distress.  HENT:  Head: Normocephalic.  Mouth/Throat: No oropharyngeal exudate.  Hearing aid in place   Eyes: Pupils are equal, round, and reactive to light. Conjunctivae are normal. Right eye exhibits no discharge. Left eye exhibits no discharge. No scleral icterus.  Eye glasses in place   Neck: Normal range of motion. No JVD present.  Cardiovascular: Intact distal pulses. An irregular rhythm present. Exam reveals no gallop and no friction rub.  Pulmonary/Chest: Effort normal. No respiratory distress. She has no wheezes. She has no rales.  Poor air entry oxygen 1 liter via nasal cannula in place.  Abdominal: Soft. Bowel sounds are normal. She exhibits no distension and no mass. There is no tenderness. There is no rebound and no guarding.  Musculoskeletal:  Moves x 4 extremities except limited ROM to both knees due to pain. Transfers with assistance.spends most time sitting on wheelchair.bilateral lower extremities knee high ted hose in place.   Lymphadenopathy:    She has no cervical adenopathy.    Neurological:  Pleasantly confused at her baseline.   Skin: Skin is warm and dry. No rash noted. No erythema. No pallor.  Psychiatric: She has a normal mood and affect. Her speech is normal and behavior is normal. Judgment and thought content normal. She exhibits abnormal recent memory.  Nursing note and vitals reviewed.   Labs reviewed: Recent Labs  08/09/18 1519 08/10/18 0053 08/12/18 0318  NA 140 138 133*  K 5.1 4.1 3.6  CL 102 97* 93*  CO2 29 26 28   GLUCOSE 101* 87 78  BUN 17 17 23   CREATININE 0.92 0.99 0.89  CALCIUM 9.7 9.8 8.9   Recent Labs    11/03/17 1818  12/24/17 0700  03/25/18 07/24/18 08/09/18 1519  AST 20   < > 17  17   < > 18 26 45*  ALT 13*   < > 12  12   < > 13 17 34  ALKPHOS 78   < >  --    < > 133* 105 122  BILITOT 0.4  --  0.4  0.4  --   --   --  1.2  PROT 6.5  --  7.2  7.2  --   --  6.5 8.3*  ALBUMIN 3.1*  --   --   --   --  3.4 3.8   < > = values in this interval not displayed.   Recent Labs    12/24/17 0700 01/16/18 0842  04/24/18 07/24/18 08/09/18 1519  WBC 6.5 10.3   < > 7.8 6.0 7.1  NEUTROABS  --   --   --   --   --  5.8  HGB 10.7* 11.0*   < > 9.6* 10.4* 12.2  HCT 31.9* 32.7*   < > 29* 30* 36.5  MCV 86.4 88.4  --   --   --  92.9  PLT 389 278   < > 346 210 297   < > = values in this interval not displayed.   Lab Results  Component Value Date   TSH 2.321 08/10/2018   Lab Results  Component Value Date   HGBA1C 5.3 04/16/2017   Lab Results  Component Value Date   CHOL 188 04/12/2016   HDL 62 04/12/2016   LDLCALC 102 04/12/2016   TRIG 122 04/12/2016   CHOLHDL 3.0 Ratio 06/07/2008    Significant Diagnostic Results in last 30 days:  Dg Chest 1 View  Result Date: 08/11/2018 CLINICAL DATA:  84 year old female with confusion,, hypoxia. Personal history of lung and left breast cancer. EXAM: CHEST  1 VIEW COMPARISON:  08/09/2018 chest radiographs and earlier. FINDINGS: Distal bronchiectasis previously demonstrated in the middle  lobes and left lower lobe on CT 01/24/2015. Stable lung volumes. Upper lung and right lung base ventilation is stable. Acute on chronic increased left lung base opacity appears veiling and stable since 08/09/2018. Stable obscuration of the left hemidiaphragm. Stable cardiac size and mediastinal contours. Negative visible upper abdominal bowel gas pattern. Visualized tracheal air column is within normal limits. Stable visualized osseous structures. IMPRESSION: 1. Small left pleural effusion suspected and stable since 08/09/2018 with associated left lung base atelectasis or consolidation. 2. No new cardiopulmonary abnormality. Electronically Signed   By: Genevie Ann M.D.   On: 08/11/2018 14:34   Ct Head Wo Contrast  Result Date: 08/09/2018 CLINICAL DATA:  Confusion. EXAM: CT HEAD WITHOUT CONTRAST TECHNIQUE: Contiguous axial images were obtained from the base of the skull through the vertex without intravenous contrast. COMPARISON:  None. FINDINGS: Brain: No subdural, epidural, or subarachnoid hemorrhage. Cerebellum, brainstem, and basal cisterns are normal. No mass effect or midline shift. Moderate white matter changes are noted. No acute cortical ischemia or infarct. No mass effect or midline shift. Ventricles and sulci are prominent but otherwise unremarkable. Vascular: No hyperdense vessel or unexpected calcification. Skull: Normal. Negative for fracture or  focal lesion. Sinuses/Orbits: No acute finding. Other: None. IMPRESSION: Chronic white matter changes.  No acute abnormalities noted. Electronically Signed   By: Dorise Bullion III M.D   On: 08/09/2018 16:54   Mr Brain Wo Contrast  Result Date: 08/09/2018 CLINICAL DATA:  Altered mental status. Possible aphasia. Recent pneumonia. Decreased responsiveness. The examination had to be discontinued prior to completion due to patient noncompliance. EXAM: MRI HEAD WITHOUT CONTRAST TECHNIQUE: Multiplanar, multiecho pulse sequences of the brain and surrounding  structures were obtained without intravenous contrast. COMPARISON:  CT head 08/09/2018.  MRI brain 07/07/2007 FINDINGS: Brain: The diffusion-weighted images demonstrate no acute or subacute infarction. Vascular: Not evaluated without T2 weighted imaging. Skull and upper cervical spine: Craniocervical junction is grossly normal sagittal T1 weighted images are severely degraded by patient motion. Sinuses/Orbits: Paranasal sinuses and mastoid air cells are within normal limits on the sagittal T1 weighted imaging. IMPRESSION: 1. Diffusion-weighted imaging demonstrates no acute or subacute infarction. 2. Motion degraded sagittal T1 weighted imaging was the only other sequence performed without any gross abnormality. Electronically Signed   By: San Morelle M.D.   On: 08/09/2018 21:02   Dg Chest Port 1 View  Result Date: 08/09/2018 CLINICAL DATA:  Weakness with disorientation and fall. History of lung and breast cancer. EXAM: PORTABLE CHEST 1 VIEW COMPARISON:  01/16/2018 FINDINGS: Increased or new densities at the left lung base are most compatible with pleural fluid with consolidation. Haziness in the right hilar region may represent vascular congestion. Cardiac silhouette appears to be enlargement but obscured by the left basilar chest disease. IMPRESSION: Left basilar chest densities. Findings are suggestive for a left pleural effusion with compressive atelectasis or consolidation. Left pleural effusion is probably small to moderate in size. Central vascular congestion. Electronically Signed   By: Markus Daft M.D.   On: 08/09/2018 15:20    Assessment/Plan 1. Chronic pain of both knees Afebrile.Hx of Osteoarthritis.Knee exams without any redness,swelling or signs of inflammation.will discontinue current tylenol.start extra Strength tylenol 1000 mg tablet twice daily and 500 mg tablet daily at 2 pm. Apply Biofreeze 4 % gel to both knee areas every 8 hours    2. Generalized abdominal pain POA  reported  patient had pain yesterday.No complains of pain during visit.Abdomen exam negative.continue to monitor for now.   Family/ staff Communication: Reviewed plan of care with patient and facility Nurse supervisor  Labs/tests ordered: None   Sandrea Hughs, NP

## 2018-08-20 ENCOUNTER — Encounter: Payer: Self-pay | Admitting: *Deleted

## 2018-08-21 DIAGNOSIS — I110 Hypertensive heart disease with heart failure: Secondary | ICD-10-CM | POA: Diagnosis not present

## 2018-08-21 LAB — COMPLETE METABOLIC PANEL WITH GFR
ALK PHOS: 134
ALT: 47
AST: 45
Albumin: 2.7
BILIRUBIN TOTAL: 0.6
BUN: 25 — AB (ref 4–21)
CALCIUM: 8.3
CREATININE: 0.8
GFR CALC NON AF AMER: 63
Glucose: 74
Potassium: 3.4
Sodium: 125
Total Protein: 6.2 g/dL

## 2018-08-22 ENCOUNTER — Encounter: Payer: Self-pay | Admitting: Family

## 2018-08-22 ENCOUNTER — Encounter: Payer: Self-pay | Admitting: *Deleted

## 2018-08-22 ENCOUNTER — Non-Acute Institutional Stay (SKILLED_NURSING_FACILITY): Payer: Medicare HMO | Admitting: Family

## 2018-08-22 DIAGNOSIS — R748 Abnormal levels of other serum enzymes: Secondary | ICD-10-CM | POA: Diagnosis not present

## 2018-08-22 DIAGNOSIS — E8809 Other disorders of plasma-protein metabolism, not elsewhere classified: Secondary | ICD-10-CM

## 2018-08-22 DIAGNOSIS — E871 Hypo-osmolality and hyponatremia: Secondary | ICD-10-CM | POA: Diagnosis not present

## 2018-08-22 DIAGNOSIS — Z7189 Other specified counseling: Secondary | ICD-10-CM

## 2018-08-22 DIAGNOSIS — E876 Hypokalemia: Secondary | ICD-10-CM

## 2018-08-22 NOTE — Progress Notes (Signed)
Location:  Start Room Number: 31 Place of Service:  SNF (31) Provider: Dinah Ngetich FNP-C  Blanchie Serve, MD  Patient Care Team: Blanchie Serve, MD as PCP - General (Internal Medicine) Lanelle Bal., MD as Referring Physician (Internal Medicine) Satira Sark, MD as Consulting Physician (Cardiology) Kyung Rudd, MD as Consulting Physician (Radiation Oncology)  Extended Emergency Contact Information Primary Emergency Contact: Neita Garnet of [place] Phone: [phone] Relation: Son Secondary Emergency Contact: Wiesen,Lance Address: [address]          [place], [place] [address] [name] of [place] Phone: [phone] Mobile Phone: [phone] Relation: Son  Code Status:  DNR Goals of care: Advanced Directive information Advanced Directives 08/22/2018  Does Patient Have a Medical Advance Directive? Yes  Type of Advance Directive Out of facility DNR (pink MOST or yellow form);Living will  Does patient want to make changes to medical advance directive? -  Copy of [place] in Chart? -  Would patient like information on creating a medical advance directive? -  Pre-existing out of facility DNR order (yellow form or pink MOST form) Pink MOST form placed in chart (order not valid for inpatient use)     Chief Complaint  Patient presents with  . Acute Visit    MOST form; abnormal labs    HPI:  Pt is a 84 y.o. female seen today at [facility] for an acute visit for evaluation of abnormal labs and goals of care discussing.she is seen in her room today.she complains of worsening hearing loss.Her recent lab results done 08/21/2018 showed low Na 125,K+ 3.4,alb 2.7,Alk Phosp 134,AST 45,ALT 47 (previous AST 58,ALT 58).Also had a meeting with patient's son Jannett Schmall who is the HCPOA to discuss and fill out patient's MOST form.Patient previously filled out a MOST form but was not transferred from her  recent hospital admission from [facility].POA would like previous patient's wishes on the MOST form to remain the same. Advance goal discussed in the presence of the facility social worker.    Past Medical History:  Diagnosis Date  . Allergic rhinitis   . Anemia    NOS  . Anxiety   . Biceps tendon rupture    Bilateral  . Breast cancer ([place]) 2001   Left s/p lumpectomy and XRT   . Bronchiectasis ([place]) 2014   Noted on chest x-ray  . Complication of anesthesia    slow to wake up  . Cough 2014  . Depression   . Diverticulosis   . Elevated liver enzymes    Biopsy consistent with steatohepatitis  . Fundic gland polyps of stomach, benign   . GERD (gastroesophageal reflux disease) 06/2007   EGD Dr Gala Romney >sm HH, multiple fundic gland polyps  . Glaucoma 2013   both eyes  . Hemorrhoids   . Hiatal hernia   . Hx of radiation therapy 07/17/10 to 07/21/10   SRS LLL lung  . Hyperlipidemia   . Insomnia   . Low back pain    scoliosis  . Lung cancer ([place]) 05/2010   Left 2011 - rad x 3  . Lymphedema    Left arm  . Osteoarthritis   . Osteopenia 10/25/2016  . Osteoporosis, senile   . Overactive bladder   . Pneumonia    RLL with sepsis  . PSVT (paroxysmal supraventricular tachycardia) ([place])   . Rheumatic fever    Age 46  . Right thyroid nodule   . Steatohepatitis  liver bx  . Type 2 diabetes mellitus ([place])    Past Surgical History:  Procedure Laterality Date  . ABDOMINAL HYSTERECTOMY  1964  . APPENDECTOMY  1964  . BIOPSY THYROID  11/2008  . BREAST BIOPSY     X 3 w/ cystectomy  . BREAST LUMPECTOMY  2001  . CATARACT EXTRACTION Bilateral 2003   Lens implant Dr. Charise Killian  . CHOLECYSTECTOMY  1965  . COLONOSCOPY  09/11/2011   Procedure: COLONOSCOPY;  Surgeon: Dorothyann Peng, MD;  Location: [facility];  Service: Endoscopy;  Laterality: N/A;  . COLONOSCOPY  2005 /2012   Dr. Lucianne Muss- diverticulosis, ext hemorrhoids.  . COLONOSCOPY WITH PROPOFOL N/A 11/09/2017   Procedure: COLONOSCOPY WITH  PROPOFOL;  Surgeon: Mauri Pole, MD;  Location: [facility];  Service: Endoscopy;  Laterality: N/A;  . COLONOSCOPY WITH PROPOFOL N/A 11/12/2017   Procedure: COLONOSCOPY WITH PROPOFOL;  Surgeon: Ladene Artist, MD;  Location: [facility] END[facility]COPY;  Service: Endoscopy;  Laterality: N/A;  . CYSTOSCOPY  2007  . ESOPHAGOGASTRODUODENOSCOPY  09/11/2011   Procedure: ESOPHAGOGASTRODUODENOSCOPY (EGD);  Surgeon: Dorothyann Peng, MD;  Location[facility];  Service: Endoscopy;  Laterality: N/A;  . ESOPHAGOGASTRODUODENOSCOPY (EGD) WITH PROPOFOL N/A 11/08/2017   Procedure: ESOPHAGOGASTRODUODENOSCOPY (EGD) WITH PROPOFOL;  Surgeon: Mauri Pole, MD;  Location[facility];  Service: Endoscopy;  Laterality: N/A;  . LIVER BIOPSY  2008  . LUNG BIOPSY Left 06/06/2010  . REVISION TOTAL HIP ARTHROPLASTY  2007   Left  . SKIN CANCER EXCISION     nose and left lower cheek  . TONSILLECTOMY  1930  . TOTAL HIP ARTHROPLASTY Bilateral 2005 & 2007    Dr. Earl Lagos. Aline Brochure     Allergies  Allergen Reactions  . Augmentin [Amoxicillin-Pot Clavulanate] Other (See Comments)    unknown  . Ibuprofen Hives  . Naproxen Other (See Comments)    unknown  . Statins Other (See Comments)    Feels like knives in stomach  . Surgilube [Gyne-Moistrin]     Rectal itching, burning  . Tape Rash    Outpatient Encounter Medications as of 08/22/2018  Medication Sig  . acetaminophen (TYLENOL) 325 MG tablet Take 650 mg by mouth every 6 (six) hours as needed for mild pain.   Marland Kitchen atenolol (TENORMIN) 25 MG tablet Take 1 tablet (25 mg total) by mouth 2 (two) times daily.  . B Complex Vitamins (B COMPLEX-B12) TABS Take 1 tablet by mouth daily.  . cetirizine (ZYRTEC) 10 MG tablet Take 10 mg by mouth daily.  . DULoxetine (CYMBALTA) 30 MG capsule Take 30 mg by mouth daily.  . ferrous gluconate (FERGON) 240 (27 FE) MG tablet Take 240 mg by mouth daily.  . folic acid (FOLVITE) 240 MCG tablet Take 400 mcg by mouth daily.  Marland Kitchen  gabapentin (NEURONTIN) 100 MG capsule Take 100 mg by mouth 2 (two) times daily.  Marland Kitchen ipratropium-albuterol (DUONEB) 0.5-2.5 (3) MG/3ML SOLN Take 3 mLs by nebulization every 6 (six) hours as needed.  . latanoprost (XALATAN) 0.005 % ophthalmic solution Place 1 drop into both eyes at bedtime.  . lidocaine (LIDODERM) 5 % Place 1 patch onto the skin daily as needed. Remove & Discard patch within 12 hours or as directed by MD   . Menthol, Topical Analgesic, (BIOFREEZE) 4 % GEL Apply 1 application topically every 8 (eight) hours.  . Multiple Vitamins-Minerals (PRESERVISION AREDS 2 PO) Take 1 capsule by mouth 2 (two) times daily.   Marland Kitchen omeprazole (PRILOSEC) 20 MG capsule TAKE 1 CAPSULE  EVERY DAY.  Marland Kitchen OXYGEN Inhale 1 L into the lungs continuous.  . polyethylene glycol (MIRALAX / GLYCOLAX) packet Take 17 g by mouth daily as needed.   . saccharomyces boulardii (FLORASTOR) 250 MG capsule Take 250 mg by mouth 2 (two) times daily. Stop date 08/22/18  . Simethicone 80 MG TABS Take 1 tablet by mouth 4 (four) times daily as needed.  . zinc oxide 20 % ointment Apply 1 application topically 3 (three) times daily as needed for irritation.   No facility-administered encounter medications on file as of 08/22/2018.     Review of Systems  Constitutional: Negative for appetite change, chills, fatigue, fever and unexpected weight change.  HENT: Positive for hearing loss. Negative for congestion, rhinorrhea, sinus pressure, sinus pain, sneezing and sore throat.   Eyes: Positive for visual disturbance. Negative for pain, discharge, redness and itching.       Wears eye glasses   Respiratory: Negative for cough, chest tightness, shortness of breath and wheezing.   Cardiovascular: Positive for leg swelling. Negative for chest pain and palpitations.  Gastrointestinal: Negative for abdominal distention, abdominal pain, constipation, diarrhea, nausea and vomiting.  Genitourinary: Negative for dysuria, flank pain, frequency and  urgency.  Musculoskeletal: Positive for arthralgias and gait problem.  Skin: Negative for color change, pallor and rash.  Neurological: Negative for dizziness, light-headedness and headaches.  Hematological: Does not bruise/bleed easily.  Psychiatric/Behavioral: Positive for confusion. Negative for agitation and sleep disturbance. The patient is not nervous/anxious.     Immunization History  Administered Date(s) Administered  . Influenza Whole 09/22/2008, 09/16/2012, 09/30/2013  . Influenza, High Dose Seasonal PF 09/25/2017  . Influenza-Unspecified 10/02/2014, 09/15/2015, 09/27/2016  . Pneumococcal Polysaccharide-23 08/17/2006  . Td 12/17/2005   Pertinent  Health Maintenance Due  Topic Date Due  . FOOT EXAM  07/19/1931  . OPHTHALMOLOGY EXAM  07/19/1931  . PNA vac Low Risk Adult (2 of 2 - PCV13) 08/18/2007  . URINE MICROALBUMIN  04/30/2008  . HEMOGLOBIN A1C  10/17/2017  . INFLUENZA VACCINE  07/17/2018  . DEXA SCAN  Completed   Fall Risk  09/19/2017 05/17/2016 04/20/2016 04/19/2016  Falls in the past year? No No No No   Functional Status Survey: Is the patient deaf or have difficulty hearing?: Yes Does the patient have difficulty seeing, even when wearing glasses/contacts?: Yes Does the patient have difficulty concentrating, remembering, or making decisions?: Yes Does the patient have difficulty walking or climbing stairs?: Yes Does the patient have difficulty dressing or bathing?: Yes Does the patient have difficulty doing errands alone such as visiting a doctor's office or shopping?: Yes  Vitals:   08/22/18 1113  BP: 111/61  Pulse: 65  Resp: 20  Temp: 98 F (36.7 C)  SpO2: 97%  Weight: 143 lb 6.4 oz (65 kg)  Height: _0  (1.626 m)   Body mass index is 24.61 kg/m. Physical Exam  Constitutional: She appears well-developed and well-nourished.  Elderly in no acute distress   HENT:  Head: Normocephalic.  Mouth/Throat: Oropharynx is clear and moist. No oropharyngeal  exudate.  Hearing aids in place   Eyes: Pupils are equal, round, and reactive to light. Conjunctivae and EOM are normal. Right eye exhibits no discharge. Left eye exhibits no discharge. No scleral icterus.  Neck: Normal range of motion. No JVD present.  Pulmonary/Chest: Effort normal. No respiratory distress. She has no wheezes. She has no rales.  Poor air entry oxygen via nasal cannula in place.   Abdominal: Soft. Bowel sounds are normal. She  exhibits no distension and no mass. There is no tenderness. There is no rebound and no guarding.  Musculoskeletal: She exhibits no tenderness.  Moves x 4 extremities.Transfers with assistance to wheelchair.bilateral lower extremities edema.   Lymphadenopathy:    She has no cervical adenopathy.  Neurological: Gait abnormal.  Confused at her baseline   Skin: Skin is warm and dry. No rash noted. No erythema. No pallor.  Psychiatric: She has a normal mood and affect. Her speech is normal and behavior is normal. Judgment and thought content normal. She exhibits abnormal recent memory.  Nursing note and vitals reviewed.   Labs reviewed: Recent Labs    08/09/18 1519 08/10/18 0053 08/12/18 0318 08/19/18 08/21/18  NA 140 138 133* 128 125  K 5.1 4.1 3.6 3.5 3.4  CL 102 97* 93*  --   --   CO2 _0 --   --   GLUCOSE 101* 87 78  --   --   BUN _1 25*  CREATININE 0.92 0.99 0.89 0.82 0.80  CALCIUM 9.7 9.8 8.9 8.8 8.3   Recent Labs    08/09/18 1519 08/19/18 08/21/18  AST 45* 58 45  ALT 34 58 47  ALKPHOS 122 122 134  BILITOT 1.2 0.8 0.6  PROT 8.3* 6.4 6.2  ALBUMIN 3.8 2.8 2.7   Recent Labs    01/16/18 0842  04/24/18 07/24/18 08/09/18 1519 08/19/18  WBC 10.3   < > 7.8 6.0 7.1 9.8  NEUTROABS  --   --   --   --  5.8  --   HGB 11.0*   < > 9.6* 10.4* 12.2 11.6  HCT 32.7*   < > 29* 30* 36.5 35.3  MCV 88.4  --   --   --  92.9 93.6  PLT 278   < > 346 210 297  --    < > = values in this interval not displayed.   Lab Results  Component  Value Date   TSH 2.321 08/10/2018   Lab Results  Component Value Date   HGBA1C 5.3 04/16/2017   Lab Results  Component Value Date   CHOL 188 04/12/2016   HDL 62 04/12/2016   LDLCALC 102 04/12/2016   TRIG 122 04/12/2016   CHOLHDL 3.0 Ratio 06/07/2008    Significant Diagnostic Results in last 30 days:  Dg Chest 1 View  Result Date: 08/11/2018 CLINICAL DATA:  84 year old female with confusion,, hypoxia. Personal history of lung and left breast cancer. EXAM: CHEST  1 VIEW COMPARISON:  08/09/2018 chest radiographs and earlier. FINDINGS: Distal bronchiectasis previously demonstrated in the middle lobes and left lower lobe on CT 01/24/2015. Stable lung volumes. Upper lung and right lung base ventilation is stable. Acute on chronic increased left lung base opacity appears veiling and stable since 08/09/2018. Stable obscuration of the left hemidiaphragm. Stable cardiac size and mediastinal contours. Negative visible upper abdominal bowel gas pattern. Visualized tracheal air column is within normal limits. Stable visualized osseous structures. IMPRESSION: 1. Small left pleural effusion suspected and stable since 08/09/2018 with associated left lung base atelectasis or consolidation. 2. No new cardiopulmonary abnormality. Electronically Signed   By: Genevie Ann M.D.   On: 08/11/2018 14:34   Ct Head Wo Contrast  Result Date: 08/09/2018 CLINICAL DATA:  Confusion. EXAM: CT HEAD WITHOUT CONTRAST TECHNIQUE: Contiguous axial images were obtained from the base of the skull through the vertex without intravenous contrast. COMPARISON:  None. FINDINGS: Brain: No subdural, epidural, or subarachnoid hemorrhage.  Cerebellum, brainstem, and basal cisterns are normal. No mass effect or midline shift. Moderate white matter changes are noted. No acute cortical ischemia or infarct. No mass effect or midline shift. Ventricles and sulci are prominent but otherwise unremarkable. Vascular: No hyperdense vessel or unexpected  calcification. Skull: Normal. Negative for fracture or focal lesion. Sinuses/Orbits: No acute finding. Other: None. IMPRESSION: Chronic white matter changes.  No acute abnormalities noted. Electronically Signed   By: Dorise Bullion III M.D   On: 08/09/2018 16:54   Mr Brain Wo Contrast  Result Date: 08/09/2018 CLINICAL DATA:  Altered mental status. Possible aphasia. Recent pneumonia. Decreased responsiveness. The examination had to be discontinued prior to completion due to patient noncompliance. EXAM: MRI HEAD WITHOUT CONTRAST TECHNIQUE: Multiplanar, multiecho pulse sequences of the brain and surrounding structures were obtained without intravenous contrast. COMPARISON:  CT head 08/09/2018.  MRI brain 07/07/2007 FINDINGS: Brain: The diffusion-weighted images demonstrate no acute or subacute infarction. Vascular: Not evaluated without T2 weighted imaging. Skull and upper cervical spine: Craniocervical junction is grossly normal sagittal T1 weighted images are severely degraded by patient motion. Sinuses/Orbits: Paranasal sinuses and mastoid air cells are within normal limits on the sagittal T1 weighted imaging. IMPRESSION: 1. Diffusion-weighted imaging demonstrates no acute or subacute infarction. 2. Motion degraded sagittal T1 weighted imaging was the only other sequence performed without any gross abnormality. Electronically Signed   By: San Morelle M.D.   On: 08/09/2018 21:02   Dg Chest Port 1 View  Result Date: 08/09/2018 CLINICAL DATA:  Weakness with disorientation and fall. History of lung and breast cancer. EXAM: PORTABLE CHEST 1 VIEW COMPARISON:  01/16/2018 FINDINGS: Increased or new densities at the left lung base are most compatible with pleural fluid with consolidation. Haziness in the right hilar region may represent vascular congestion. Cardiac silhouette appears to be enlargement but obscured by the left basilar chest disease. IMPRESSION: Left basilar chest densities. Findings are  suggestive for a left pleural effusion with compressive atelectasis or consolidation. Left pleural effusion is probably small to moderate in size. Central vascular congestion. Electronically Signed   By: Markus Daft M.D.   On: 08/09/2018 15:20    Assessment/Plan  1. Goals of care, counseling/discussion Last ACP Note 05/24/2018 to 08/22/2018       ACP (Advance Care Planning) by Sandrea Hughs, NP at 08/22/2018 11:09 AM    Date of Service   Author Author Type Status Note Type File Time  08/22/2018 Addend Ngetich, Nelda Bucks, NP Nurse Practitioner Signed ACP (Advance Care Planning) 08/22/2018             Advance care goals discussed with patient's son Tanaiya Kolarik [phone] or [phone] who is the [place].Patient chart reviewed [place] and living will in place.Of note Patient had a MOST filled previously though original copy missing since patient was transferred from [facility] from recent hospital admission.Copy of MOST form in place.goals of care discussed with patient's HCPOA in presence of facility social worker.POA would like patient to remain DNR in case of a cardiopulmonary event if patient has no pulse and is not breathing.He would also like limited additional medical intervention if patient has a pulse and/or breathing use medical treatment,IVF and cardiac monitoring as indicated.Do not use intubation or mechanical ventilation.May consider use of less invasive airway support such as Bipap or CPAP.Also provide comfort measures.May transfer to hospital if indicated but avoid intensive care.Determine use or limitation of antibiotics when infection occurs and may offer oral fluid and nutrition if physically  feasible.IVF for a defined trial period but no feeding tube.I've discussed goal of care with POA from 1: 55 pm - 2:15 PM.I've answered POA questions to the best of my knowledge.                2. Hyponatremia Chronic.Recent Na+ 125 ( 08/21/2018) previous 128 (08/19/2018); 133 (08/12/2018).start  on Sodium chloride 1 Gm tablet by mouth once daily.recheck CMP 08/25/2018.   3. Hypokalemia K+ 3.4( 08/21/2018).replete with KCL 40 meq tablet x 1 dose.then recheck CMP 08/25/2018.  4. Hypoalbuminemia Alb 2.7 ( 08/25/2018).Continue to follow up with facility Registered Dietician.monitor CMP as above.    5. Abnormal liver enzymes AST and ALT still elevated though values seems to be trending down.Medical reviewed.Abdominal exam negative.continue to monitor.Recheck CMP 08/25/2018.  Family/ staff Communication: Reviewed plan of care with patient,Patient's son Leverne Tessler HCPOA,facility social Scientist, research (physical sciences).   Labs/tests ordered: CMP 08/25/2018.   Sandrea Hughs, NP

## 2018-08-22 NOTE — ACP (Advance Care Planning) (Signed)
Advance care goals discussed with patient's son Harlo Jaso [phone] or [phone] who is the [facility].Patient chart reviewed [place] and living will in place.Of note Patient had a MOST filled previously though original copy missing since patient was transferred from [facility] from recent hospital admission.Copy of MOST form in place.goals of care discussed with patient's HCPOA in presence of facility social worker.POA would like patient to remain DNR in case of a cardiopulmonary event if patient has no pulse and is not breathing.He would also like limited additional medical intervention if patient has a pulse and/or breathing use medical treatment,IVF and cardiac monitoring as indicated.Do not use intubation or mechanical ventilation.May consider use of less invasive airway support such as Bipap or CPAP.Also provide comfort measures.May transfer to hospital if indicated but avoid intensive care.Determine use or limitation of antibiotics when infection occurs and may offer oral fluid and nutrition if physically feasible.IVF for a defined trial period but no feeding tube.I've discussed goal of care with POA from 1: 55 pm - 2:15 PM.I've answered POA questions to the best of my knowledge.

## 2018-08-25 DIAGNOSIS — I110 Hypertensive heart disease with heart failure: Secondary | ICD-10-CM | POA: Diagnosis not present

## 2018-08-25 LAB — COMPLETE METABOLIC PANEL WITH GFR
ALBUMIN: 2.6
ALT: 29
AST: 32
Alkaline Phosphatase: 122
BUN: 20 (ref 4–21)
CREATININE: 0.86
Calcium: 8.7
GFR CALC NON AF AMER: 57
GLUCOSE: 68
Potassium: 3.8
SODIUM: 133
Total Bilirubin: 0.4
Total Protein: 6.1 g/dL

## 2018-08-26 ENCOUNTER — Encounter: Payer: Self-pay | Admitting: *Deleted

## 2018-09-10 ENCOUNTER — Encounter: Payer: Self-pay | Admitting: Family

## 2018-09-10 ENCOUNTER — Non-Acute Institutional Stay (SKILLED_NURSING_FACILITY): Payer: Medicare HMO | Admitting: Family

## 2018-09-10 DIAGNOSIS — B3731 Acute candidiasis of vulva and vagina: Secondary | ICD-10-CM

## 2018-09-10 DIAGNOSIS — I110 Hypertensive heart disease with heart failure: Secondary | ICD-10-CM

## 2018-09-10 DIAGNOSIS — I5032 Chronic diastolic (congestive) heart failure: Secondary | ICD-10-CM

## 2018-09-10 DIAGNOSIS — R69 Illness, unspecified: Secondary | ICD-10-CM | POA: Diagnosis not present

## 2018-09-10 DIAGNOSIS — Z23 Encounter for immunization: Secondary | ICD-10-CM | POA: Diagnosis not present

## 2018-09-10 DIAGNOSIS — F418 Other specified anxiety disorders: Secondary | ICD-10-CM

## 2018-09-10 DIAGNOSIS — B373 Candidiasis of vulva and vagina: Secondary | ICD-10-CM

## 2018-09-10 DIAGNOSIS — I480 Paroxysmal atrial fibrillation: Secondary | ICD-10-CM | POA: Diagnosis not present

## 2018-09-15 NOTE — Progress Notes (Signed)
Location:  [facility] Room Number: N-31 Place of Service:  SNF (31) Provider: Dinah Ngetich FNP-C   Blanchie Serve, MD  Patient Care Team: Blanchie Serve, MD as PCP - General (Internal Medicine) Lanelle Bal., MD as Referring Physician (Internal Medicine) Satira Sark, MD as Consulting Physician (Cardiology) Kyung Rudd, MD as Consulting Physician (Radiation Oncology)  Extended Emergency Contact Information Primary Emergency Contact: Neita Garnet of [place] Phone: [phone] Relation: Son Secondary Emergency Contact: Hayden,Lance Address: [address]          [place], [place] [address] [name] of [place] Phone: [phone] Mobile Phone: [phone] Relation: Son  Code Status:  DNR Goals of care: Advanced Directive information Advanced Directives 09/10/2018  Does Patient Have a Medical Advance Directive? Yes  Type of Advance Directive Living will;Out of facility DNR (pink MOST or yellow form);Healthcare Power of Attorney  Does patient want to make changes to medical advance directive? -  Copy of [place] in Chart? Yes  Would patient like information on creating a medical advance directive? -  Pre-existing out of facility DNR order (yellow form or pink MOST form) Pink MOST form placed in chart (order not valid for inpatient use)     Chief Complaint  Patient presents with  . Medical Management of Chronic Issues    Resident is being seen for a routine visit.     HPI:  Pt is a 84 y.o. female seen today [facility] for medical management of chronic diseases. She has a medical history of HTN,Afib,chronic diastolic CHF,GERD,depression with anxiety,OA ,osteoporosis among other conditions.she is seen in her room today with facility Nurse present at bedside.she complains of vaginal discharge states was given a vaginal cream for yeast infection but didn't resolve.she says couldn't hold cream in  place.she denies any strong odor.she has had yeast infections in the past.  She also request referral to [facility].she states needs new eye glasses.she was seen in [facility] in the past.  She is also due for a Tdap and Prevnar 13 injection.she denies any fever,chills or cough.Her weight has been stable and no recent fall episodes reported.Her sodium level was 125 (08/21/2018) Sodium chloride 1 Gm tablet by mouth once daily initiated .Rechecked na+ now 133 ( 08/25/2018).      Past Medical History:  Diagnosis Date  . Allergic rhinitis   . Anemia    NOS  . Anxiety   . Biceps tendon rupture    Bilateral  . Breast cancer ([facility]) 2001   Left s/p lumpectomy and XRT   . Bronchiectasis ([facility]) 2014   Noted on chest x-ray  . Complication of anesthesia    slow to wake up  . Cough 2014  . Depression   . Diverticulosis   . Elevated liver enzymes    Biopsy consistent with steatohepatitis  . Fundic gland polyps of stomach, benign   . GERD (gastroesophageal reflux disease) 06/2007   EGD Dr Gala Romney >sm HH, multiple fundic gland polyps  . Glaucoma 2013   both eyes  . Hemorrhoids   . Hiatal hernia   . Hx of radiation therapy 07/17/10 to 07/21/10   SRS LLL lung  . Hyperlipidemia   . Insomnia   . Low back pain    scoliosis  . Lung cancer ([place]) 05/2010   Left 2011 - rad x 3  . Lymphedema    Left arm  . Osteoarthritis   . Osteopenia 10/25/2016  .  Osteoporosis, senile   . Overactive bladder   . Pneumonia    RLL with sepsis  . PSVT (paroxysmal supraventricular tachycardia) ([facility])   . Rheumatic fever    Age 53  . Right thyroid nodule   . Steatohepatitis    liver bx  . Type 2 diabetes mellitus ([place])    Past Surgical History:  Procedure Laterality Date  . ABDOMINAL HYSTERECTOMY  1964  . APPENDECTOMY  1964  . BIOPSY THYROID  11/2008  . BREAST BIOPSY     X 3 w/ cystectomy  . BREAST LUMPECTOMY  2001  . CATARACT EXTRACTION Bilateral 2003   Lens implant Dr. Charise Killian  .  CHOLECYSTECTOMY  1965  . COLONOSCOPY  09/11/2011   Procedure: COLONOSCOPY;  Surgeon: Dorothyann Peng, MD;  Location: [facility];  Service: Endoscopy;  Laterality: N/A;  . COLONOSCOPY  2005 /2012   Dr. Lucianne Muss- diverticulosis, ext hemorrhoids.  . COLONOSCOPY WITH PROPOFOL N/A 11/09/2017   Procedure: COLONOSCOPY WITH PROPOFOL;  Surgeon: Mauri Pole, MD;  Location: [facility];  Service: Endoscopy;  Laterality: N/A;  . COLONOSCOPY WITH PROPOFOL N/A 11/12/2017   Procedure: COLONOSCOPY WITH PROPOFOL;  Surgeon: Ladene Artist, MD;  Location: [facility];  Service: Endoscopy;  Laterality: N/A;  . CYSTOSCOPY  2007  . ESOPHAGOGASTRODUODENOSCOPY  09/11/2011   Procedure: ESOPHAGOGASTRODUODENOSCOPY (EGD);  Surgeon: Dorothyann Peng, MD;  Location: [facility];  Service: Endoscopy;  Laterality: N/A;  . ESOPHAGOGASTRODUODENOSCOPY (EGD) WITH PROPOFOL N/A 11/08/2017   Procedure: ESOPHAGOGASTRODUODENOSCOPY (EGD) WITH PROPOFOL;  Surgeon: Mauri Pole, MD;  Location: [facility];  Service: Endoscopy;  Laterality: N/A;  . LIVER BIOPSY  2008  . LUNG BIOPSY Left 06/06/2010  . REVISION TOTAL HIP ARTHROPLASTY  2007   Left  . SKIN CANCER EXCISION     nose and left lower cheek  . TONSILLECTOMY  1930  . TOTAL HIP ARTHROPLASTY Bilateral 2005 & 2007    Dr. Earl Lagos. Aline Brochure     Allergies  Allergen Reactions  . Augmentin [Amoxicillin-Pot Clavulanate] Other (See Comments)    unknown  . Ibuprofen Hives  . Naproxen Other (See Comments)    unknown  . Statins Other (See Comments)    Feels like knives in stomach  . Surgilube [Gyne-Moistrin]     Rectal itching, burning  . Tape Rash    Allergies as of 09/10/2018      Reactions   Augmentin [amoxicillin-pot Clavulanate] Other (See Comments)   unknown   Ibuprofen Hives   Naproxen Other (See Comments)   unknown   Statins Other (See Comments)   Feels like knives in stomach   Surgilube [gyne-moistrin]    Rectal itching, burning   Tape Rash        Medication List        Accurate as of 09/10/18 11:59 PM. Always use your most recent med list.          acetaminophen 325 MG tablet Commonly known as:  TYLENOL Take 650 mg by mouth every 6 (six) hours as needed for mild pain.   atenolol 25 MG tablet Commonly known as:  TENORMIN Take 1 tablet (25 mg total) by mouth 2 (two) times daily.   B Complex-B12 Tabs Take 1 tablet by mouth daily.   BIOFREEZE 4 % Gel Generic drug:  Menthol (Topical Analgesic) Apply 1 application topically every 8 (eight) hours.   cetirizine 10 MG tablet Commonly known as:  ZYRTEC Take 10 mg by mouth daily.   DULoxetine 30 MG capsule  Commonly known as:  CYMBALTA Take 30 mg by mouth daily.   ferrous gluconate 240 (27 FE) MG tablet Commonly known as:  FERGON Take 240 mg by mouth daily.   folic acid 109 MCG tablet Commonly known as:  FOLVITE Take 400 mcg by mouth daily.   gabapentin 100 MG capsule Commonly known as:  NEURONTIN Take 100 mg by mouth 2 (two) times daily.   ipratropium-albuterol 0.5-2.5 (3) MG/3ML Soln Commonly known as:  DUONEB Take 3 mLs by nebulization every 6 (six) hours as needed.   latanoprost 0.005 % ophthalmic solution Commonly known as:  XALATAN Place 1 drop into both eyes at bedtime.   lidocaine 5 % Commonly known as:  LIDODERM Place 1 patch onto the skin daily as needed. Remove & Discard patch within 12 hours or as directed by MD   omeprazole 20 MG capsule Commonly known as:  PRILOSEC TAKE 1 CAPSULE EVERY DAY.   OXYGEN Inhale 1 L into the lungs continuous.   polyethylene glycol packet Commonly known as:  MIRALAX / GLYCOLAX Take 17 g by mouth daily as needed.   PRESERVISION AREDS 2 PO Take 1 capsule by mouth 2 (two) times daily.   saccharomyces boulardii 250 MG capsule Commonly known as:  FLORASTOR Take 250 mg by mouth 2 (two) times daily. Stop date 08/22/18   Simethicone 80 MG Tabs Take 1 tablet by mouth 4 (four) times daily as needed.   sodium  chloride 1 g tablet Take 1 g by mouth daily.   zinc oxide 20 % ointment Apply 1 application topically 3 (three) times daily as needed for irritation.       Review of Systems  Constitutional: Negative for appetite change, chills, fatigue and fever.  HENT: Positive for hearing loss. Negative for congestion, rhinorrhea, sinus pressure, sinus pain, sneezing and sore throat.   Eyes: Positive for visual disturbance. Negative for discharge and redness.       Requires new eye glasses  Respiratory: Negative for cough, chest tightness, shortness of breath and wheezing.   Cardiovascular: Negative for chest pain, palpitations and leg swelling.  Gastrointestinal: Negative for abdominal distention, abdominal pain, constipation, diarrhea, nausea and vomiting.  Endocrine: Negative for cold intolerance, heat intolerance, polydipsia, polyphagia and polyuria.  Genitourinary: Positive for vaginal discharge. Negative for dysuria, flank pain, frequency, pelvic pain, urgency, vaginal bleeding and vaginal pain.  Musculoskeletal: Positive for arthralgias and gait problem.  Skin: Negative for color change, pallor, rash and wound.  Neurological: Negative for dizziness, light-headedness and headaches.  Hematological: Does not bruise/bleed easily.  Psychiatric/Behavioral: Negative for agitation and sleep disturbance. The patient is not nervous/anxious.     Immunization History  Administered Date(s) Administered  . Influenza Whole 09/22/2008, 09/16/2012, 09/30/2013  . Influenza, High Dose Seasonal PF 09/25/2017  . Influenza-Unspecified 10/02/2014, 09/15/2015, 09/27/2016  . Pneumococcal Polysaccharide-23 08/17/2006  . Td 12/17/2005   Pertinent  Health Maintenance Due  Topic Date Due  . FOOT EXAM  07/19/1931  . OPHTHALMOLOGY EXAM  07/19/1931  . PNA vac Low Risk Adult (2 of 2 - PCV13) 08/18/2007  . URINE MICROALBUMIN  04/30/2008  . HEMOGLOBIN A1C  10/17/2017  . INFLUENZA VACCINE  07/17/2018  . DEXA SCAN   Completed   Fall Risk  09/19/2017 05/17/2016 04/20/2016 04/19/2016  Falls in the past year? No No No No   Functional Status Survey:    Vitals:   09/10/18 1113  BP: (!) 118/58  Pulse: 60  Resp: 18  Temp: 98 F (36.7 C)  TempSrc: Oral  SpO2: 95%  Weight: 140 lb 8 oz (63.7 kg)  Height: 5' 4"  (1.626 m)   Body mass index is 24.12 kg/m. Physical Exam  Constitutional: She appears well-developed and well-nourished. No distress.  HENT:  Head: Normocephalic.  Right Ear: External ear normal.  Left Ear: External ear normal.  Mouth/Throat: Oropharynx is clear and moist. No oropharyngeal exudate.  Eyes: Pupils are equal, round, and reactive to light. Conjunctivae and EOM are normal. Right eye exhibits no discharge. Left eye exhibits no discharge. No scleral icterus.  Eye glasses in place   Neck: Normal range of motion. No JVD present. No thyromegaly present.  Cardiovascular: Normal heart sounds and intact distal pulses. An irregularly irregular rhythm present. Exam reveals no gallop and no friction rub.  No murmur heard. Pulmonary/Chest: Effort normal and breath sounds normal. No respiratory distress. She has no wheezes. She has no rales.  Abdominal: Soft. Bowel sounds are normal. She exhibits no distension and no mass. There is no tenderness. There is no rebound and no guarding.  Musculoskeletal: She exhibits no tenderness.  Transfers with assistance unsteady gait.uses wheelchair.ROM x 4 except limites to knees due to pain.bilateral edema.   Lymphadenopathy:    She has no cervical adenopathy.  Neurological: Gait abnormal.  HOH   Skin: Skin is warm and dry. No rash noted. No erythema. No pallor.  Psychiatric: She has a normal mood and affect. Her speech is normal and behavior is normal. Judgment and thought content normal. She exhibits abnormal recent memory.  Nursing note and vitals reviewed.   Labs reviewed: Recent Labs    08/09/18 1519 08/10/18 0053 08/12/18 0318 08/19/18 08/21/18  08/25/18  NA 140 138 133* 128 125 133  K 5.1 4.1 3.6 3.5 3.4 3.8  CL 102 97* 93*  --   --   --   CO2 29 26 28   --   --   --   GLUCOSE 101* 87 78  --   --   --   BUN 17 17 23 21  25* 20  CREATININE 0.92 0.99 0.89 0.82 0.80 0.86  CALCIUM 9.7 9.8 8.9 8.8 8.3 8.7   Recent Labs    08/19/18 08/21/18 08/25/18  AST 58 45 32  ALT 58 47 29  ALKPHOS 122 134 122  BILITOT 0.8 0.6 0.4  PROT 6.4 6.2 6.1  ALBUMIN 2.8 2.7 2.6   Recent Labs    01/16/18 0842  04/24/18 07/24/18 08/09/18 1519 08/19/18  WBC 10.3   < > 7.8 6.0 7.1 9.8  NEUTROABS  --   --   --   --  5.8  --   HGB 11.0*   < > 9.6* 10.4* 12.2 11.6  HCT 32.7*   < > 29* 30* 36.5 35.3  MCV 88.4  --   --   --  92.9 93.6  PLT 278   < > 346 210 297  --    < > = values in this interval not displayed.   Lab Results  Component Value Date   TSH 2.321 08/10/2018   Lab Results  Component Value Date   HGBA1C 5.3 04/16/2017   Lab Results  Component Value Date   CHOL 188 04/12/2016   HDL 62 04/12/2016   LDLCALC 102 04/12/2016   TRIG 122 04/12/2016   CHOLHDL 3.0 Ratio 06/07/2008    Significant Diagnostic Results in last 30 days:  No results found.  Assessment/Plan 1. Hypertensive heart disease with chronic diastolic congestive heart failure  B/p readings reviewed stable.contine on atenolol 25 mg tablet twice daily.continue to monitor BMP.   2. Paroxysmal atrial fibrillation (HCC) HR irregular.continue on atenolol 25 mg tablet twice daily.off anticoagulant due to advance age and high risk for falls and bleeding.  3. Chronic diastolic CHF (congestive heart failure) (HCC) Stable.Lungs negative for cough,rales or wheezing.contine to monitor weight.bilateral lower extremities trace edema.continue on knee high ted hose.     4. Depression with anxiety Mood stable.continue on Duloxetine 30 mg tablet daily.monitor for mood changes.    5. Vaginal candidiasis Previous vaginal cream ordered during hospital admission ineffective.start  Diflucan 150 mg tablet one by mouth every 72 hours x 3 doses.   6. Need for Tdap vaccination Administer Tdap vaccine 0.5 ml via I.M x 1 dose.monitor for signs of reactions.   7. Need for pneumococcal vaccination Administer Prevnar 13 vaccine inject 0.5 ml via I.M x 1 dose.   Family/ staff Communication: Reviewed plan of care with patient and facility Nurse   Labs/tests ordered: None   Nelda Bucks Ngetich, NP

## 2018-09-24 ENCOUNTER — Non-Acute Institutional Stay (SKILLED_NURSING_FACILITY): Payer: Medicare HMO | Admitting: Family

## 2018-09-24 ENCOUNTER — Encounter: Payer: Self-pay | Admitting: Family

## 2018-09-24 DIAGNOSIS — R509 Fever, unspecified: Secondary | ICD-10-CM

## 2018-09-24 DIAGNOSIS — R05 Cough: Secondary | ICD-10-CM | POA: Diagnosis not present

## 2018-09-24 DIAGNOSIS — E871 Hypo-osmolality and hyponatremia: Secondary | ICD-10-CM

## 2018-09-24 DIAGNOSIS — R059 Cough, unspecified: Secondary | ICD-10-CM

## 2018-09-24 NOTE — Progress Notes (Signed)
Location:  [facility] Room Number: 31 Place of Service:  SNF (31) Provider: Dinah Ngetich FNP-C  Blanchie Serve, MD  Patient Care Team: Blanchie Serve, MD as PCP - General (Internal Medicine) Lanelle Bal., MD as Referring Physician (Internal Medicine) Satira Sark, MD as Consulting Physician (Cardiology) Kyung Rudd, MD as Consulting Physician (Radiation Oncology)  Extended Emergency Contact Information Primary Emergency Contact: Neita Garnet of [place] Phone: [phone] Relation: Son Secondary Emergency Contact: Dec,Lance Address: [address]          [place], [place] [address] [name] of [place] Phone: [phone] Mobile Phone: [phone] Relation: Son  Code Status:  DNR Goals of care: Advanced Directive information Advanced Directives 09/10/2018  Does Patient Have a Medical Advance Directive? Yes  Type of Advance Directive Living will;Out of facility DNR (pink MOST or yellow form);Healthcare Power of Attorney  Does patient want to make changes to medical advance directive? -  Copy of [place] in Chart? Yes  Would patient like information on creating a medical advance directive? -  Pre-existing out of facility DNR order (yellow form or pink MOST form) Pink MOST form placed in chart (order not valid for inpatient use)     Chief Complaint  Patient presents with  . Acute Visit    cough and congestion     HPI:  Pt is a 84 y.o. female seen today at [facility] for an acute visit for evaluation of cough and congestion.she is seen in her room with facility Nurse present at bedside.she states has had a cough for about 10 days.she has taken Robitussin without any relief.she states feels tired unable to participate with Physical therapy.Her Temp today was 99.0   Past Medical History:  Diagnosis Date  . Allergic rhinitis   . Anemia    NOS  . Anxiety   . Biceps tendon rupture    Bilateral  . Breast cancer ([place]) 2001   Left s/p lumpectomy and XRT   . Bronchiectasis ([place]) 2014   Noted on chest x-ray  . Complication of anesthesia    slow to wake up  . Cough 2014  . Depression   . Diverticulosis   . Elevated liver enzymes    Biopsy consistent with steatohepatitis  . Fundic gland polyps of stomach, benign   . GERD (gastroesophageal reflux disease) 06/2007   EGD Dr Gala Romney >sm HH, multiple fundic gland polyps  . Glaucoma 2013   both eyes  . Hemorrhoids   . Hiatal hernia   . Hx of radiation therapy 07/17/10 to 07/21/10   SRS LLL lung  . Hyperlipidemia   . Insomnia   . Low back pain    scoliosis  . Lung cancer ([place]) 05/2010   Left 2011 - rad x 3  . Lymphedema    Left arm  . Osteoarthritis   . Osteopenia 10/25/2016  . Osteoporosis, senile   . Overactive bladder   . Pneumonia    RLL with sepsis  . PSVT (paroxysmal supraventricular tachycardia) ([place])   . Rheumatic fever    Age 78  . Right thyroid nodule   . Steatohepatitis    liver bx  . Type 2 diabetes mellitus ([place])    Past Surgical History:  Procedure Laterality Date  . ABDOMINAL HYSTERECTOMY  1964  . APPENDECTOMY  1964  . BIOPSY THYROID  11/2008  . BREAST BIOPSY     X 3 w/ cystectomy  . BREAST LUMPECTOMY  2001  . CATARACT EXTRACTION Bilateral 2003   Lens implant Dr. Charise Killian  . CHOLECYSTECTOMY  1965  . COLONOSCOPY  09/11/2011   Procedure: COLONOSCOPY;  Surgeon: Dorothyann Peng, MD;  Location: [facility];  Service: Endoscopy;  Laterality: N/A;  . COLONOSCOPY  2005 /2012   Dr. Lucianne Muss- diverticulosis, ext hemorrhoids.  . COLONOSCOPY WITH PROPOFOL N/A 11/09/2017   Procedure: COLONOSCOPY WITH PROPOFOL;  Surgeon: Mauri Pole, MD;  Location: [facility];  Service: Endoscopy;  Laterality: N/A;  . COLONOSCOPY WITH PROPOFOL N/A 11/12/2017   Procedure: COLONOSCOPY WITH PROPOFOL;  Surgeon: Ladene Artist, MD;  Location: [facility];  Service: Endoscopy;  Laterality: N/A;  . CYSTOSCOPY  2007  .  ESOPHAGOGASTRODUODENOSCOPY  09/11/2011   Procedure: ESOPHAGOGASTRODUODENOSCOPY (EGD);  Surgeon: Dorothyann Peng, MD;  Location: [facility];  Service: Endoscopy;  Laterality: N/A;  . ESOPHAGOGASTRODUODENOSCOPY (EGD) WITH PROPOFOL N/A 11/08/2017   Procedure: ESOPHAGOGASTRODUODENOSCOPY (EGD) WITH PROPOFOL;  Surgeon: Mauri Pole, MD;  Location: [facility];  Service: Endoscopy;  Laterality: N/A;  . LIVER BIOPSY  2008  . LUNG BIOPSY Left 06/06/2010  . REVISION TOTAL HIP ARTHROPLASTY  2007   Left  . SKIN CANCER EXCISION     nose and left lower cheek  . TONSILLECTOMY  1930  . TOTAL HIP ARTHROPLASTY Bilateral 2005 & 2007    Dr. Earl Lagos. Aline Brochure     Allergies  Allergen Reactions  . Augmentin [Amoxicillin-Pot Clavulanate] Other (See Comments)    unknown  . Ibuprofen Hives  . Naproxen Other (See Comments)    unknown  . Statins Other (See Comments)    Feels like knives in stomach  . Surgilube [Gyne-Moistrin]     Rectal itching, burning  . Tape Rash    Outpatient Encounter Medications as of 09/24/2018  Medication Sig  . acetaminophen (TYLENOL) 325 MG tablet Take 650 mg by mouth every 6 (six) hours as needed for mild pain.   [place] Kitchen atenolol (TENORMIN) 25 MG tablet Take 1 tablet (25 mg total) by mouth 2 (two) times daily.  . B Complex Vitamins (B COMPLEX-B12) TABS Take 1 tablet by mouth daily.  . cetirizine (ZYRTEC) 10 MG tablet Take 10 mg by mouth daily.  . DULoxetine (CYMBALTA) 30 MG capsule Take 30 mg by mouth daily.  . ferrous gluconate (FERGON) 240 (27 FE) MG tablet Take 240 mg by mouth daily.  . folic acid (FOLVITE) 494 MCG tablet Take 400 mcg by mouth daily.  [place] Kitchen gabapentin (NEURONTIN) 100 MG capsule Take 100 mg by mouth 2 (two) times daily.  [place] Kitchen ipratropium-albuterol (DUONEB) 0.5-2.5 (3) MG/3ML SOLN Take 3 mLs by nebulization every 6 (six) hours as needed.  . latanoprost (XALATAN) 0.005 % ophthalmic solution Place 1 drop into both eyes at bedtime.  . lidocaine (LIDODERM) 5 %  Place 1 patch onto the skin daily as needed. Remove & Discard patch within 12 hours or as directed by MD   . Menthol, Topical Analgesic, (BIOFREEZE) 4 % GEL Apply 1 application topically every 8 (eight) hours.  . Multiple Vitamins-Minerals (PRESERVISION AREDS 2 PO) Take 1 capsule by mouth 2 (two) times daily.   [place] Kitchen omeprazole (PRILOSEC) 20 MG capsule TAKE 1 CAPSULE EVERY DAY.  [place] Kitchen OXYGEN Inhale 1 L into the lungs continuous.  . polyethylene glycol (MIRALAX / GLYCOLAX) packet Take 17 g by mouth daily as needed.   . saccharomyces boulardii (FLORASTOR) 250 MG capsule Take 250 mg by mouth 2 (two) times daily. Stop date 08/22/18  . Simethicone 80  MG TABS Take 1 tablet by mouth 4 (four) times daily as needed.  . sodium chloride 1 g tablet Take 1 g by mouth daily.  [place] Kitchen zinc oxide 20 % ointment Apply 1 application topically 3 (three) times daily as needed for irritation.   No facility-administered encounter medications on file as of 09/24/2018.     Review of Systems  Constitutional: Positive for fatigue and fever. Negative for appetite change, chills and unexpected weight change.  HENT: Positive for hearing loss. Negative for congestion, rhinorrhea, sinus pressure, sinus pain, sneezing and sore throat.   Eyes: Positive for visual disturbance. Negative for discharge and redness.       Wears eye glasses  Respiratory: Positive for cough. Negative for chest tightness, shortness of breath and wheezing.   Cardiovascular: Positive for leg swelling. Negative for chest pain and palpitations.  Gastrointestinal: Negative for abdominal distention, abdominal pain, constipation, diarrhea, nausea and vomiting.  Genitourinary: Negative for dysuria, flank pain, frequency and urgency.  Musculoskeletal: Positive for gait problem.       Self propel on wheelchair   Skin: Negative for color change, pallor, rash and wound.  Neurological: Negative for dizziness, light-headedness and headaches.  Hematological: Does not  bruise/bleed easily.  Psychiatric/Behavioral: Negative for agitation, confusion and sleep disturbance. The patient is not nervous/anxious.        More alert and oriented this visit     Immunization History  Administered Date(s) Administered  . Influenza Whole 09/22/2008, 09/16/2012, 09/30/2013  . Influenza, High Dose Seasonal PF 09/25/2017  . Influenza-Unspecified 10/02/2014, 09/15/2015, 09/27/2016  . Pneumococcal Polysaccharide-23 08/17/2006  . Td 12/17/2005   Pertinent  Health Maintenance Due  Topic Date Due  . FOOT EXAM  07/19/1931  . OPHTHALMOLOGY EXAM  07/19/1931  . PNA vac Low Risk Adult (2 of 2 - PCV13) 08/18/2007  . URINE MICROALBUMIN  04/30/2008  . HEMOGLOBIN A1C  10/17/2017  . INFLUENZA VACCINE  07/17/2018  . DEXA SCAN  Completed   Fall Risk  09/19/2017 05/17/2016 04/20/2016 04/19/2016  Falls in the past year? No No No No    Vitals:   09/24/18 1450  BP: (!) 166/74  Pulse: 66  Resp: 18  Temp: 99 F (37.2 C)  SpO2: 93%  Weight: 137 lb 6.4 oz (62.3 kg)  Height: 5' 4"  (1.626 m)   Body mass index is 23.58 kg/m. Physical Exam  Constitutional: She appears well-developed and well-nourished.  Elderly in no acute distress   HENT:  Head: Normocephalic.  Right Ear: External ear normal.  Left Ear: External ear normal.  Mouth/Throat: Oropharynx is clear and moist. No oropharyngeal exudate.  Hearing aids in place   Eyes: Pupils are equal, round, and reactive to light. Conjunctivae and EOM are normal. Right eye exhibits no discharge. Left eye exhibits no discharge. No scleral icterus.  Corrective lens in place   Neck: Normal range of motion. No JVD present. No thyromegaly present.  Cardiovascular: Intact distal pulses. An irregularly irregular rhythm present. Exam reveals no gallop and no friction rub.  Pulmonary/Chest: Effort normal. No respiratory distress. She has no wheezes.  Left mid lobe rales and bilateral diminished bases.oxygen off during visit SATs 93 on Room air.     Abdominal: Soft. Bowel sounds are normal. She exhibits no distension and no mass. There is no tenderness. There is no rebound and no guarding.  Musculoskeletal: She exhibits no tenderness.  Unsteady gait self propels on wheelchair.bilateral lower extremities trace edema.   Lymphadenopathy:    She has no  cervical adenopathy.  Neurological:  Alert and oriented to person and place   Skin: Skin is warm and dry. Capillary refill takes 2 to 3 seconds. No rash noted. No erythema. No pallor.  Psychiatric: She has a normal mood and affect. Her speech is normal and behavior is normal. Judgment and thought content normal.  Nursing note and vitals reviewed.   Labs reviewed: Recent Labs    08/09/18 1519 08/10/18 0053 08/12/18 0318 08/19/18 08/21/18 08/25/18  NA 140 138 133* 128 125 133  K 5.1 4.1 3.6 3.5 3.4 3.8  CL 102 97* 93*  --   --   --   CO2 29 26 28   --   --   --   GLUCOSE 101* 87 78  --   --   --   BUN 17 17 23 21  25* 20  CREATININE 0.92 0.99 0.89 0.82 0.80 0.86  CALCIUM 9.7 9.8 8.9 8.8 8.3 8.7   Recent Labs    08/19/18 08/21/18 08/25/18  AST 58 45 32  ALT 58 47 29  ALKPHOS 122 134 122  BILITOT 0.8 0.6 0.4  PROT 6.4 6.2 6.1  ALBUMIN 2.8 2.7 2.6   Recent Labs    01/16/18 0842  04/24/18 07/24/18 08/09/18 1519 08/19/18  WBC 10.3   < > 7.8 6.0 7.1 9.8  NEUTROABS  --   --   --   --  5.8  --   HGB 11.0*   < > 9.6* 10.4* 12.2 11.6  HCT 32.7*   < > 29* 30* 36.5 35.3  MCV 88.4  --   --   --  92.9 93.6  PLT 278   < > 346 210 297  --    < > = values in this interval not displayed.   Lab Results  Component Value Date   TSH 2.321 08/10/2018   Lab Results  Component Value Date   HGBA1C 5.3 04/16/2017   Lab Results  Component Value Date   CHOL 188 04/12/2016   HDL 62 04/12/2016   LDLCALC 102 04/12/2016   TRIG 122 04/12/2016   CHOLHDL 3.0 Ratio 06/07/2008    Significant Diagnostic Results in last 30 days:  No results found.  Assessment/Plan 1. Cough Temp 99.0  bilateral lung bases diminished left mid lobe rales.continue on Robitussin as needed.obtain portable CXR 2 views rule out pneumonia.Encourage incentive spirometer every 6 hours while awake.   2. Low grade fever Temp 99.0 continue on Tylenol 650 mg tablet every 6 hours as needed.check vital signs every shift x 5 days.CBC/diff and CMP 09/25/2018 to rule out infectious and metabolic etiologies.   3. Hyponatremia   Recent NA+ 133 ( 08/25/2018).check CMP as above. Will order monthly BMP.continue on sodium chloride 1 gm daily.   Family/ staff Communication: Reviewed plan of care with patient and facility Nurse supervisor  Labs/tests ordered: CBC/diff and CMP 09/25/2018  Sandrea Hughs, NP

## 2018-09-25 LAB — HEPATIC FUNCTION PANEL
ALT: 11 (ref 7–35)
AST: 18 (ref 13–35)
Alkaline Phosphatase: 139 — AB (ref 25–125)
Bilirubin, Total: 0.5

## 2018-09-25 LAB — BASIC METABOLIC PANEL
BUN: 18 (ref 4–21)
Creatinine: 0.7 (ref 0.5–1.1)
Glucose: 76
Potassium: 3.3 — AB (ref 3.4–5.3)
Sodium: 140 (ref 137–147)

## 2018-09-25 LAB — CBC AND DIFFERENTIAL
HCT: 29 — AB (ref 36–46)
Hemoglobin: 9.9 — AB (ref 12.0–16.0)
PLATELETS: 211 (ref 150–399)
WBC: 3.1

## 2018-10-07 ENCOUNTER — Non-Acute Institutional Stay (SKILLED_NURSING_FACILITY): Payer: Medicare HMO

## 2018-10-07 ENCOUNTER — Encounter: Payer: Self-pay | Admitting: Family

## 2018-10-07 ENCOUNTER — Non-Acute Institutional Stay (SKILLED_NURSING_FACILITY): Payer: Medicare HMO | Admitting: Family

## 2018-10-07 DIAGNOSIS — Z0000 Encounter for general adult medical examination without abnormal findings: Secondary | ICD-10-CM | POA: Diagnosis not present

## 2018-10-07 DIAGNOSIS — R058 Other specified cough: Secondary | ICD-10-CM

## 2018-10-07 DIAGNOSIS — R05 Cough: Secondary | ICD-10-CM

## 2018-10-07 NOTE — Progress Notes (Signed)
Location:  [facility] Room Number: 31 Place of Service:  SNF (31) Provider: Sian Rockers FNP-C  Aundrey Elahi, Nelda Bucks, NP  Patient Care Team: Jalayiah Bibian, Nelda Bucks, NP as PCP - General (Family Medicine) Lannette Donath Glo Herring., MD as Referring Physician (Internal Medicine) Satira Sark, MD as Consulting Physician (Cardiology) Kyung Rudd, MD as Consulting Physician (Radiation Oncology)  Extended Emergency Contact Information Primary Emergency Contact: Neita Garnet of [place] Phone: [phone] Relation: Son Secondary Emergency Contact: Paulus,Lance Address: [address]          [place], [place] [address] [name] of [place] Phone: [phone] Mobile Phone: [phone] Relation: Son  Code Status:  DNR Goals of care: Advanced Directive information Advanced Directives 10/07/2018  Does Patient Have a Medical Advance Directive? Yes  Type of Paramedic of [place];Living will;Out of facility DNR (pink MOST or yellow form)  Does patient want to make changes to medical advance directive? No - Patient declined  Copy of [place] in Chart? Yes  Would patient like information on creating a medical advance directive? -  Pre-existing out of facility DNR order (yellow form or pink MOST form) Pink MOST form placed in chart (order not valid for inpatient use)     Chief Complaint  Patient presents with  . Acute Visit    Cough    HPI:  Pt is a 84 y.o. female seen today at [facility] for an acute visit for evaluation of non-productive cough.she is seen in her room today.facility Nurse reports via SBAR that patient continue to cough after completion of oral antibiotics.she is currently on  Robitussin  As needed and Duoneb every 6 hours as needed.Has an incentive spirometer but states has not been using it. She request her oxygen 1 Liter via nasal cannula to be discontinued states does not want to be  dependent on oxygen.She denies any shortness of breath,wheezing,fever or chills.     Past Medical History:  Diagnosis Date  . Allergic rhinitis   . Anemia    NOS  . Anxiety   . Biceps tendon rupture    Bilateral  . Breast cancer ([place]) 2001   Left s/p lumpectomy and XRT   . Bronchiectasis ([place]) 2014   Noted on chest x-ray  . Complication of anesthesia    slow to wake up  . Cough 2014  . Depression   . Diverticulosis   . Elevated liver enzymes    Biopsy consistent with steatohepatitis  . Fundic gland polyps of stomach, benign   . GERD (gastroesophageal reflux disease) 06/2007   EGD Dr Gala Romney >sm HH, multiple fundic gland polyps  . Glaucoma 2013   both eyes  . Hemorrhoids   . Hiatal hernia   . Hx of radiation therapy 07/17/10 to 07/21/10   SRS LLL lung  . Hyperlipidemia   . Insomnia   . Low back pain    scoliosis  . Lung cancer ([place]) 05/2010   Left 2011 - rad x 3  . Lymphedema    Left arm  . Osteoarthritis   . Osteopenia 10/25/2016  . Osteoporosis, senile   . Overactive bladder   . Pneumonia    RLL with sepsis  . PSVT (paroxysmal supraventricular tachycardia) ([place])   . Rheumatic fever    Age 65  . Right thyroid nodule   . Steatohepatitis    liver bx  . Type 2 diabetes mellitus ([place])    Past Surgical History:  Procedure Laterality Date  . ABDOMINAL HYSTERECTOMY  1964  . APPENDECTOMY  1964  . BIOPSY THYROID  11/2008  . BREAST BIOPSY     X 3 w/ cystectomy  . BREAST LUMPECTOMY  2001  . CATARACT EXTRACTION Bilateral 2003   Lens implant Dr. Charise Killian  . CHOLECYSTECTOMY  1965  . COLONOSCOPY  09/11/2011   Procedure: COLONOSCOPY;  Surgeon: Dorothyann Peng, MD;  Location: [facility];  Service: Endoscopy;  Laterality: N/A;  . COLONOSCOPY  2005 /2012   Dr. Lucianne Muss- diverticulosis, ext hemorrhoids.  . COLONOSCOPY WITH PROPOFOL N/A 11/09/2017   Procedure: COLONOSCOPY WITH PROPOFOL;  Surgeon: Mauri Pole, MD;  Location: [facility];  Service: Endoscopy;  Laterality: N/A;   . COLONOSCOPY WITH PROPOFOL N/A 11/12/2017   Procedure: COLONOSCOPY WITH PROPOFOL;  Surgeon: Ladene Artist, MD;  Location: [facility];  Service: Endoscopy;  Laterality: N/A;  . CYSTOSCOPY  2007  . ESOPHAGOGASTRODUODENOSCOPY  09/11/2011   Procedure: ESOPHAGOGASTRODUODENOSCOPY (EGD);  Surgeon: Dorothyann Peng, MD;  Location: [facility];  Service: Endoscopy;  Laterality: N/A;  . ESOPHAGOGASTRODUODENOSCOPY (EGD) WITH PROPOFOL N/A 11/08/2017   Procedure: ESOPHAGOGASTRODUODENOSCOPY (EGD) WITH PROPOFOL;  Surgeon: Mauri Pole, MD;  Location: [facility];  Service: Endoscopy;  Laterality: N/A;  . LIVER BIOPSY  2008  . LUNG BIOPSY Left 06/06/2010  . REVISION TOTAL HIP ARTHROPLASTY  2007   Left  . SKIN CANCER EXCISION     nose and left lower cheek  . TONSILLECTOMY  1930  . TOTAL HIP ARTHROPLASTY Bilateral 2005 & 2007    Dr. Earl Lagos. Aline Brochure     Allergies  Allergen Reactions  . Augmentin [Amoxicillin-Pot Clavulanate] Other (See Comments)    unknown  . Ibuprofen Hives  . Naproxen Other (See Comments)    unknown  . Statins Other (See Comments)    Feels like knives in stomach  . Surgilube [Gyne-Moistrin]     Rectal itching, burning  . Tape Rash    Outpatient Encounter Medications as of 10/07/2018  Medication Sig  . acetaminophen (TYLENOL) 325 MG tablet Take 650 mg by mouth every 6 (six) hours as needed for mild pain.   Marland Kitchen atenolol (TENORMIN) 25 MG tablet Take 1 tablet (25 mg total) by mouth 2 (two) times daily.  . B Complex Vitamins (B COMPLEX-B12) TABS Take 1 tablet by mouth daily.  . cetirizine (ZYRTEC) 10 MG tablet Take 10 mg by mouth daily.  . DULoxetine (CYMBALTA) 30 MG capsule Take 30 mg by mouth daily.  . ferrous gluconate (FERGON) 240 (27 FE) MG tablet Take 240 mg by mouth daily.  . folic acid (FOLVITE) 409 MCG tablet Take 400 mcg by mouth daily.  Marland Kitchen gabapentin (NEURONTIN) 100 MG capsule Take 100 mg by mouth 2 (two) times daily.  Marland Kitchen ipratropium-albuterol (DUONEB)  0.5-2.5 (3) MG/3ML SOLN Take 3 mLs by nebulization every 6 (six) hours as needed.  . latanoprost (XALATAN) 0.005 % ophthalmic solution Place 1 drop into both eyes at bedtime.  . lidocaine (LIDODERM) 5 % Place 1 patch onto the skin daily as needed. Remove & Discard patch within 12 hours or as directed by MD   . Menthol, Topical Analgesic, (BIOFREEZE) 4 % GEL Apply 1 application topically every 8 (eight) hours.  . Multiple Vitamins-Minerals (PRESERVISION AREDS 2 PO) Take 1 capsule by mouth 2 (two) times daily.   Marland Kitchen omeprazole (PRILOSEC) 20 MG capsule TAKE 1 CAPSULE EVERY DAY.  Marland Kitchen OXYGEN Inhale 1 L into the lungs continuous.  . polyethylene glycol (  MIRALAX / GLYCOLAX) packet Take 17 g by mouth daily as needed.   . saccharomyces boulardii (FLORASTOR) 250 MG capsule Take 250 mg by mouth 2 (two) times daily. Stop date 08/22/18  . Simethicone 80 MG TABS Take 1 tablet by mouth 4 (four) times daily as needed.  . sodium chloride 1 g tablet Take 1 g by mouth daily.  Marland Kitchen zinc oxide 20 % ointment Apply 1 application topically 3 (three) times daily as needed for irritation.   No facility-administered encounter medications on file as of 10/07/2018.     Review of Systems  Constitutional: Negative for appetite change, chills, fever and unexpected weight change.  HENT: Negative for congestion, rhinorrhea, sinus pressure, sinus pain, sneezing and sore throat.   Respiratory: Positive for cough. Negative for chest tightness, shortness of breath and wheezing.   Cardiovascular: Negative for chest pain, palpitations and leg swelling.  Gastrointestinal: Negative for abdominal distention, abdominal pain, constipation, diarrhea, nausea and vomiting.  Musculoskeletal: Positive for gait problem.  Skin: Negative for color change, pallor and rash.  Neurological: Negative for dizziness, light-headedness and headaches.  Psychiatric/Behavioral: Negative for agitation, confusion and sleep disturbance.    Immunization History    Administered Date(s) Administered  . Influenza Whole 09/22/2008, 09/16/2012, 09/30/2013  . Influenza, High Dose Seasonal PF 09/25/2017  . Influenza-Unspecified 10/02/2014, 09/15/2015, 09/27/2016  . Pneumococcal Conjugate-13 11/29/2017  . Pneumococcal Polysaccharide-23 08/17/2006  . Td 12/17/2005   Pertinent  Health Maintenance Due  Topic Date Due  . FOOT EXAM  07/19/1931  . OPHTHALMOLOGY EXAM  07/19/1931  . URINE MICROALBUMIN  04/30/2008  . HEMOGLOBIN A1C  10/17/2017  . INFLUENZA VACCINE  07/17/2018  . DEXA SCAN  Completed  . PNA vac Low Risk Adult  Completed   Fall Risk  10/07/2018 09/19/2017 05/17/2016 04/20/2016 04/19/2016  Falls in the past year? Yes No No No No  Number falls in past yr: 1 - - - -  Injury with Fall? No - - - -    Vitals:   10/07/18 1448  BP: 130/72  Pulse: 78  Resp: 18  Temp: (!) 97.4 F (36.3 C)  TempSrc: Oral  SpO2: 91%  Weight: 138 lb 8 oz (62.8 kg)  Height: 5' 4"  (1.626 m)   Body mass index is 23.77 kg/m. Physical Exam  Constitutional: She is oriented to person, place, and time. She appears well-developed.  Elderly in no acute distress   HENT:  Head: Normocephalic.  Mouth/Throat: Oropharynx is clear and moist. No oropharyngeal exudate.  Eyes: Pupils are equal, round, and reactive to light. Conjunctivae and EOM are normal. Right eye exhibits no discharge. Left eye exhibits no discharge. No scleral icterus.  Eye glasses in place   Neck: Normal range of motion. No JVD present. No thyromegaly present.  Cardiovascular: Intact distal pulses. An irregularly irregular rhythm present. Exam reveals no gallop and no friction rub.  Pulmonary/Chest: No respiratory distress. She has no wheezes. She has no rales.  Poor air entry   Abdominal: Soft. Bowel sounds are normal. She exhibits no distension and no mass. There is no tenderness. There is no rebound and no guarding.  Musculoskeletal: She exhibits no edema or tenderness.  Unsteady gait self propel on  wheelchair   Lymphadenopathy:    She has no cervical adenopathy.  Neurological: She is oriented to person, place, and time.  Skin: Skin is warm and dry. No rash noted. No erythema. No pallor.  Psychiatric: She has a normal mood and affect. Her speech is normal  and behavior is normal. Judgment and thought content normal.  Nursing note and vitals reviewed.   Labs reviewed: Recent Labs    08/09/18 1519 08/10/18 0053 08/12/18 0318  08/19/18 08/21/18 08/25/18 09/25/18  NA 140 138 133*   < > 128 125 133 140  K 5.1 4.1 3.6   < > 3.5 3.4 3.8 3.3*  CL 102 97* 93*  --   --   --   --   --   CO2 29 26 28   --   --   --   --   --   GLUCOSE 101* 87 78  --   --   --   --   --   BUN 17 17 23   --  21 25* 20 18  CREATININE 0.92 0.99 0.89  --  0.82 0.80 0.86 0.7  CALCIUM 9.7 9.8 8.9  --  8.8 8.3 8.7  --    < > = values in this interval not displayed.   Recent Labs    08/19/18 08/21/18 08/25/18 09/25/18  AST 58 45 32 18  ALT 58 47 29 11  ALKPHOS 122 134 122 139*  BILITOT 0.8 0.6 0.4  --   PROT 6.4 6.2 6.1  --   ALBUMIN 2.8 2.7 2.6  --    Recent Labs    01/16/18 0842  07/24/18 08/09/18 1519 08/19/18 09/25/18  WBC 10.3   < > 6.0 7.1 9.8 3.1  NEUTROABS  --   --   --  5.8  --   --   HGB 11.0*   < > 10.4* 12.2 11.6 9.9*  HCT 32.7*   < > 30* 36.5 35.3 29*  MCV 88.4  --   --  92.9 93.6  --   PLT 278   < > 210 297  --  211   < > = values in this interval not displayed.   Lab Results  Component Value Date   TSH 2.321 08/10/2018   Lab Results  Component Value Date   HGBA1C 5.3 04/16/2017   Lab Results  Component Value Date   CHOL 188 04/12/2016   HDL 62 04/12/2016   LDLCALC 102 04/12/2016   TRIG 122 04/12/2016   CHOLHDL 3.0 Ratio 06/07/2008    Significant Diagnostic Results in last 30 days:  No results found.  Assessment/Plan   Non-productive cough Afebrile.bilateral lung poor air entry on exam.No wheezing or rales noted.Breathing stable on room air.Requests oxygen to be  discontinued.I've discussed with patient for Nurse to wean off oxygen as tolerated to keep oxygen saturation > 90%. - Check CBC/diff 10/09/2018 - Portable CXR 2 views follow up pneumonia.  Family/ staff Communication: Reviewed plan of care with patient and facility Nurse.   Labs/tests ordered: - Check CBC/diff 10/09/2018 - Portable CXR 2 views follow up pneumonia.   Sandrea Hughs, NP

## 2018-10-07 NOTE — Progress Notes (Signed)
Subjective:   [name] is a 84 y.o. female who presents for Medicare Annual (Subsequent) preventive examination at [place]  Last AWV-09/19/2017       Objective:     Vitals: BP 126/62 (BP Location: Left Arm, Patient Position: Sitting)   Pulse 66   Temp 98 F (36.7 C) (Oral)   Ht 5' 4"  (1.626 m)   Wt 137 lb (62.1 kg)   BMI 23.52 kg/m   Body mass index is 23.52 kg/m.  Advanced Directives 10/07/2018 09/10/2018 08/22/2018 08/19/2018 08/13/2018 08/09/2018 07/23/2018  Does Patient Have a Medical Advance Directive? Yes Yes Yes Yes Yes Yes Yes  Type of Advance Directive Living will;Out of facility DNR (pink MOST or yellow form);Healthcare Power of Attorney Living will;Out of facility DNR (pink MOST or yellow form);Healthcare Power of [employer] of facility DNR (pink MOST or yellow form);Living will Living will;Out of facility DNR (pink MOST or yellow form) Living will;Out of facility DNR (pink MOST or yellow form) Living will;Out of facility DNR (pink MOST or yellow form) Living will;Out of facility DNR (pink MOST or yellow form)  Does patient want to make changes to medical advance directive? No - Patient declined - - - No - Patient declined No - Patient declined No - Patient declined  Copy of [place] in Chart? Yes Yes - - - - -  Would patient like information on creating a medical advance directive? - - - - - - -  Pre-existing out of facility DNR order (yellow form or pink MOST form) Pink MOST form placed in chart (order not valid for inpatient use) Pink MOST form placed in chart (order not valid for inpatient use) Pink MOST form placed in chart (order not valid for inpatient use) Pink MOST form placed in chart (order not valid for inpatient use) Pink MOST form placed in chart (order not valid for inpatient use) Yellow form placed in chart (order not valid for inpatient use);Pink MOST form placed in chart (order not valid for inpatient use) Yellow form  placed in chart (order not valid for inpatient use)    Tobacco Social History   Tobacco Use  Smoking Status Never Smoker  Smokeless Tobacco Never Used  Tobacco Comment   2nd hand-husband     Counseling given: Not Answered Comment: 2nd hand-husband   Clinical Intake:  Pre-visit preparation completed: No  Pain : No/denies pain     Diabetes: No  How often do you need to have someone help you when you read instructions, pamphlets, or other written materials from your doctor or pharmacy?: 2 - Rarely What is the last grade level you completed in school?: college  Interpreter Needed?: No  Information entered by :: Tyson Dense, RN  Past Medical History:  Diagnosis Date  . Allergic rhinitis   . Anemia    NOS  . Anxiety   . Biceps tendon rupture    Bilateral  . Breast cancer ([place]) 2001   Left s/p lumpectomy and XRT   . Bronchiectasis ([place]) 2014   Noted on chest x-ray  . Complication of anesthesia    slow to wake up  . Cough 2014  . Depression   . Diverticulosis   . Elevated liver enzymes    Biopsy consistent with steatohepatitis  . Fundic gland polyps of stomach, benign   . GERD (gastroesophageal reflux disease) 06/2007   EGD Dr Gala Romney >sm HH, multiple fundic gland polyps  . Glaucoma 2013  both eyes  . Hemorrhoids   . Hiatal hernia   . Hx of radiation therapy 07/17/10 to 07/21/10   SRS LLL lung  . Hyperlipidemia   . Insomnia   . Low back pain    scoliosis  . Lung cancer ([place]) 05/2010   Left 2011 - rad x 3  . Lymphedema    Left arm  . Osteoarthritis   . Osteopenia 10/25/2016  . Osteoporosis, senile   . Overactive bladder   . Pneumonia    RLL with sepsis  . PSVT (paroxysmal supraventricular tachycardia) ([place])   . Rheumatic fever    Age 35  . Right thyroid nodule   . Steatohepatitis    liver bx  . Type 2 diabetes mellitus ([place])    Past Surgical History:  Procedure Laterality Date  . ABDOMINAL HYSTERECTOMY  1964  . APPENDECTOMY  1964  . BIOPSY  THYROID  11/2008  . BREAST BIOPSY     X 3 w/ cystectomy  . BREAST LUMPECTOMY  2001  . CATARACT EXTRACTION Bilateral 2003   Lens implant Dr. Charise Killian  . CHOLECYSTECTOMY  1965  . COLONOSCOPY  09/11/2011   Procedure: COLONOSCOPY;  Surgeon: Dorothyann Peng, MD;  [facility];  Service: Endoscopy;  Laterality: N/A;  . COLONOSCOPY  2005 /2012   Dr. Lucianne Muss- diverticulosis, ext hemorrhoids.  . COLONOSCOPY WITH PROPOFOL N/A 11/09/2017   Procedure: COLONOSCOPY WITH PROPOFOL;  Surgeon: Mauri Pole, MD;  Location: [facility];  Service: Endoscopy;  Laterality: N/A;  . COLONOSCOPY WITH PROPOFOL N/A 11/12/2017   Procedure: COLONOSCOPY WITH PROPOFOL;  Surgeon: Ladene Artist, MD;  [facility];  Service: Endoscopy;  Laterality: N/A;  . CYSTOSCOPY  2007  . ESOPHAGOGASTRODUODENOSCOPY  09/11/2011   Procedure: ESOPHAGOGASTRODUODENOSCOPY (EGD);  Surgeon: Dorothyann Peng, MD;  [facility];  Service: Endoscopy;  Laterality: N/A;  . ESOPHAGOGASTRODUODENOSCOPY (EGD) WITH PROPOFOL N/A 11/08/2017   Procedure: ESOPHAGOGASTRODUODENOSCOPY (EGD) WITH PROPOFOL;  Surgeon: Mauri Pole, MD;  [facility];  Service: Endoscopy;  Laterality: N/A;  . LIVER BIOPSY  2008  . LUNG BIOPSY Left 06/06/2010  . REVISION TOTAL HIP ARTHROPLASTY  2007   Left  . SKIN CANCER EXCISION     nose and left lower cheek  . TONSILLECTOMY  1930  . TOTAL HIP ARTHROPLASTY Bilateral 2005 & 2007    Dr. Earl Lagos. Aline Brochure    Family History  Problem Relation Age of Onset  . Heart failure Mother   . Osteoporosis Mother   . Hypertension Father   . Stroke Father   . Heart failure Father   . Leukemia Brother   . Heart failure Brother   . Cancer Maternal Grandmother   . Stroke Maternal Grandfather   . Cancer Unknown    Social History   Socioeconomic History  . Marital status: Widowed    Spouse name: Not on file  . Number of children: 2  . Years of education: college   . Highest  education level: Not on file  Occupational History  . Occupation: retired Tax Comptroller: RETIRED  Social Needs  . Financial resource strain: Not hard at all  . Food insecurity:    Worry: Never true    Inability: Never true  . Transportation needs:    Medical: No    Non-medical: No  Tobacco Use  . Smoking status: Never Smoker  . Smokeless tobacco: Never Used  . Tobacco comment: 2nd hand-husband  Substance and Sexual Activity  .  Alcohol use: No  . Drug use: No  . Sexual activity: Not Currently  Lifestyle  . Physical activity:    Days per week: 7 days    Minutes per session: 30 min  . Stress: Not at all  Relationships  . Social connections:    Talks on phone: More than three times a week    Gets together: More than three times a week    Attends religious service: Never    Active member of club or organization: No    Attends meetings of clubs or organizations: Never    Relationship status: Widowed  Other Topics Concern  . Not on file  Social History Narrative   Lives at [facility] 02/2013   2 adopted children   Was in [place]   Widowed 2013   Walks with walker   Exercise: none    POA - Living Will    Outpatient Encounter Medications as of 10/07/2018  Medication Sig  . acetaminophen (TYLENOL) 325 MG tablet Take 650 mg by mouth every 6 (six) hours as needed for mild pain.   Marland Kitchen atenolol (TENORMIN) 25 MG tablet Take 1 tablet (25 mg total) by mouth 2 (two) times daily.  . B Complex Vitamins (B COMPLEX-B12) TABS Take 1 tablet by mouth daily.  . cetirizine (ZYRTEC) 10 MG tablet Take 10 mg by mouth daily.  . DULoxetine (CYMBALTA) 30 MG capsule Take 30 mg by mouth daily.  . ferrous gluconate (FERGON) 240 (27 FE) MG tablet Take 240 mg by mouth daily.  . folic acid (FOLVITE) 295 MCG tablet Take 400 mcg by mouth daily.  Marland Kitchen gabapentin (NEURONTIN) 100 MG capsule Take 100 mg by mouth 2 (two) times daily.  Marland Kitchen ipratropium-albuterol (DUONEB) 0.5-2.5 (3) MG/3ML  SOLN Take 3 mLs by nebulization every 6 (six) hours as needed.  . latanoprost (XALATAN) 0.005 % ophthalmic solution Place 1 drop into both eyes at bedtime.  . lidocaine (LIDODERM) 5 % Place 1 patch onto the skin daily as needed. Remove & Discard patch within 12 hours or as directed by MD   . Menthol, Topical Analgesic, (BIOFREEZE) 4 % GEL Apply 1 application topically every 8 (eight) hours.  . Multiple Vitamins-Minerals (PRESERVISION AREDS 2 PO) Take 1 capsule by mouth 2 (two) times daily.   Marland Kitchen omeprazole (PRILOSEC) 20 MG capsule TAKE 1 CAPSULE EVERY DAY.  Marland Kitchen OXYGEN Inhale 1 L into the lungs continuous.  . polyethylene glycol (MIRALAX / GLYCOLAX) packet Take 17 g by mouth daily as needed.   . saccharomyces boulardii (FLORASTOR) 250 MG capsule Take 250 mg by mouth 2 (two) times daily. Stop date 08/22/18  . Simethicone 80 MG TABS Take 1 tablet by mouth 4 (four) times daily as needed.  . sodium chloride 1 g tablet Take 1 g by mouth daily.  Marland Kitchen zinc oxide 20 % ointment Apply 1 application topically 3 (three) times daily as needed for irritation.   No facility-administered encounter medications on file as of 10/07/2018.     Activities of Daily Living In your present state of health, do you have any difficulty performing the following activities: 10/07/2018 08/22/2018  Hearing? Tempie Donning  Vision? N Y  Difficulty concentrating or making decisions? Tempie Donning  Walking or climbing stairs? Y Y  Dressing or bathing? Y Y  Doing errands, shopping? Tempie Donning  Preparing Food and eating ? Y -  Using the Toilet? Y -  In the past six months, have you accidently leaked urine? N -  Do you have problems with loss of bowel control? N -  Managing your Medications? Y -  Managing your Finances? Y -  Housekeeping or managing your Housekeeping? Y -  Some recent data might be hidden    Patient Care Team: Ngetich, Nelda Bucks, NP as PCP - General (Family Medicine) Lanelle Bal., MD as Referring Physician (Internal  Medicine) Satira Sark, MD as Consulting Physician (Cardiology) Kyung Rudd, MD as Consulting Physician (Radiation Oncology)    Assessment:   This is a routine wellness examination for [name].  Exercise Activities and Dietary recommendations Current Exercise Habits: Structured exercise class, Type of exercise: Other - see comments(PT), Time (Minutes): 30, Frequency (Times/Week): 7, Weekly Exercise (Minutes/Week): 210, Intensity: Mild, Exercise limited by: orthopedic condition(s)  Goals    . Maintain LIfestyle     Starting today pt will maintain lifestyle.        Fall Risk Fall Risk  10/07/2018 09/19/2017 05/17/2016 04/20/2016 04/19/2016  Falls in the past year? Yes No No No No  Number falls in past yr: 1 - - - -  Injury with Fall? No - - - -   Is the patient's home free of loose throw rugs in walkways, pet beds, electrical cords, etc?   yes      Grab bars in the bathroom? yes      Handrails on the stairs?   yes      Adequate lighting?   yes  Depression Screen PHQ 2/9 Scores 10/07/2018 09/19/2017 05/17/2016 04/20/2016  PHQ - 2 Score 0 0 0 0     Cognitive Function MMSE - Mini Mental State Exam 10/07/2018 09/19/2017  Orientation to time 5 5  Orientation to Place 5 5  Registration 3 3  Attention/ Calculation 5 5  Recall 3 3  Language- name 2 objects 2 2  Language- repeat 1 1  Language- follow 3 step command 3 3  Language- read & follow direction 1 1  Write a sentence 1 1  Copy design 1 1  Total score 30 30        Immunization History  Administered Date(s) Administered  . Influenza Whole 09/22/2008, 09/16/2012, 09/30/2013  . Influenza, High Dose Seasonal PF 09/25/2017  . Influenza-Unspecified 10/02/2014, 09/15/2015, 09/27/2016  . Pneumococcal Conjugate-13 11/29/2017  . Pneumococcal Polysaccharide-23 08/17/2006  . Td 12/17/2005    Qualifies for Shingles Vaccine? Yes, not in past records  Screening Tests Health Maintenance  Topic Date Due  . FOOT EXAM  07/19/1931   . OPHTHALMOLOGY EXAM  07/19/1931  . URINE MICROALBUMIN  04/30/2008  . TETANUS/TDAP  12/18/2015  . HEMOGLOBIN A1C  10/17/2017  . INFLUENZA VACCINE  07/17/2018  . DEXA SCAN  Completed  . PNA vac Low Risk Adult  Completed    Cancer Screenings: Lung: Low Dose CT Chest recommended if Age 59-80 years, 30 pack-year currently smoking OR have quit w/in 15years. Patient does not qualify. Breast:  Up to date on Mammogram? Yes   Up to date of Bone Density/Dexa? Yes Colorectal: up to date  Additional Screenings:  Hepatitis C Screening: declined Flu vaccine due:wil receive at [facility] TDAP due: ordered     Plan:    I have personally reviewed and addressed the Medicare Annual Wellness questionnaire and have noted the following in the patient's chart:  A. Medical and social history B. Use of alcohol, tobacco or illicit drugs  C. Current medications and supplements D. Functional ability and status E.  Nutritional status F.  Physical activity G.  Advance directives H. List of other physicians I.  Hospitalizations, surgeries, and ER visits in previous 12 months J.  [place] to include hearing, vision, cognitive, depression L. Referrals and appointments - none  In addition, I have reviewed and discussed with patient certain preventive protocols, quality metrics, and best practice recommendations. A written personalized care plan for preventive services as well as general preventive health recommendations were provided to patient.  See attached scanned questionnaire for additional information.   Signed,   Tyson Dense, RN Nurse Health Advisor  Patient Concerns: None

## 2018-10-07 NOTE — Patient Instructions (Signed)
[name] , Thank you for taking time to come for your Medicare Wellness Visit. I appreciate your ongoing commitment to your health goals. Please review the following plan we discussed and let me know if I can assist you in the future.   Screening recommendations/referrals: Colonoscopy excluded, over age 82 Mammogram excluded, over age 43 Bone Density up to date Recommended yearly ophthalmology/optometry visit for glaucoma screening and checkup Recommended yearly dental visit for hygiene and checkup  Vaccinations: Influenza vaccine due, will receive at [facility] Pneumococcal vaccine up to date, completed Tdap vaccine due, ordered Shingles vaccine due    Advanced directives: in chart  Conditions/risks identified: Fall risk  Next appointment: Dr. Sabra Heck makes rounds   Preventive Care 13 Years and Older, Female Preventive care refers to lifestyle choices and visits with your health care provider that can promote health and wellness. What does preventive care include?  A yearly physical exam. This is also called an annual well check.  Dental exams once or twice a year.  Routine eye exams. Ask your health care provider how often you should have your eyes checked.  Personal lifestyle choices, including:  Daily care of your teeth and gums.  Regular physical activity.  Eating a healthy diet.  Avoiding tobacco and drug use.  Limiting alcohol use.  Practicing safe sex.  Taking low-dose aspirin every day.  Taking vitamin and mineral supplements as recommended by your health care provider. What happens during an annual well check? The services and screenings done by your health care provider during your annual well check will depend on your age, overall health, lifestyle risk factors, and family history of disease. Counseling  Your health care provider may ask you questions about your:  Alcohol use.  Tobacco use.  Drug use.  Emotional well-being.  Home and relationship  well-being.  Sexual activity.  Eating habits.  History of falls.  Memory and ability to understand (cognition).  Work and work Statistician.  Reproductive health. Screening  You may have the following tests or measurements:  Height, weight, and BMI.  Blood pressure.  Lipid and cholesterol levels. These may be checked every 5 years, or more frequently if you are over 41 years old.  Skin check.  Lung cancer screening. You may have this screening every year starting at age 1 if you have a 30-pack-year history of smoking and currently smoke or have quit within the past 15 years.  Fecal occult blood test (FOBT) of the stool. You may have this test every year starting at age 44.  Flexible sigmoidoscopy or colonoscopy. You may have a sigmoidoscopy every 5 years or a colonoscopy every 10 years starting at age 55.  Hepatitis C blood test.  Hepatitis B blood test.  Sexually transmitted disease (STD) testing.  Diabetes screening. This is done by checking your blood sugar (glucose) after you have not eaten for a while (fasting). You may have this done every 1-3 years.  Bone density scan. This is done to screen for osteoporosis. You may have this done starting at age 28.  Mammogram. This may be done every 1-2 years. Talk to your health care provider about how often you should have regular mammograms. Talk with your health care provider about your test results, treatment options, and if necessary, the need for more tests. Vaccines  Your health care provider may recommend certain vaccines, such as:  Influenza vaccine. This is recommended every year.  Tetanus, diphtheria, and acellular pertussis (Tdap, Td) vaccine. You may need a Td  booster every 10 years.  Zoster vaccine. You may need this after age 73.  Pneumococcal 13-valent conjugate (PCV13) vaccine. One dose is recommended after age 2.  Pneumococcal polysaccharide (PPSV23) vaccine. One dose is recommended after age  6. Talk to your health care provider about which screenings and vaccines you need and how often you need them. This information is not intended to replace advice given to you by your health care provider. Make sure you discuss any questions you have with your health care provider. Document Released: 12/30/2015 Document Revised: 08/22/2016 Document Reviewed: 10/04/2015 Elsevier Interactive Patient Education  2017 [employer] Prevention in the Home Falls can cause injuries. They can happen to people of all ages. There are many things you can do to make your home safe and to help prevent falls. What can I do on the outside of my home?  Regularly fix the edges of walkways and driveways and fix any cracks.  Remove anything that might make you trip as you walk through a door, such as a raised step or threshold.  Trim any bushes or trees on the path to your home.  Use bright outdoor lighting.  Clear any walking paths of anything that might make someone trip, such as rocks or tools.  Regularly check to see if handrails are loose or broken. Make sure that both sides of any steps have handrails.  Any raised decks and porches should have guardrails on the edges.  Have any leaves, snow, or ice cleared regularly.  Use sand or salt on walking paths during winter.  Clean up any spills in your garage right away. This includes oil or grease spills. What can I do in the bathroom?  Use night lights.  Install grab bars by the toilet and in the tub and shower. Do not use towel bars as grab bars.  Use non-skid mats or decals in the tub or shower.  If you need to sit down in the shower, use a plastic, non-slip stool.  Keep the floor dry. Clean up any water that spills on the floor as soon as it happens.  Remove soap buildup in the tub or shower regularly.  Attach bath mats securely with double-sided non-slip rug tape.  Do not have throw rugs and other things on the floor that can make  you trip. What can I do in the bedroom?  Use night lights.  Make sure that you have a light by your bed that is easy to reach.  Do not use any sheets or blankets that are too big for your bed. They should not hang down onto the floor.  Have a firm chair that has side arms. You can use this for support while you get dressed.  Do not have throw rugs and other things on the floor that can make you trip. What can I do in the kitchen?  Clean up any spills right away.  Avoid walking on wet floors.  Keep items that you use a lot in easy-to-reach places.  If you need to reach something above you, use a strong step stool that has a grab bar.  Keep electrical cords out of the way.  Do not use floor polish or wax that makes floors slippery. If you must use wax, use non-skid floor wax.  Do not have throw rugs and other things on the floor that can make you trip. What can I do with my stairs?  Do not leave any items on the stairs.  Make sure that there are handrails on both sides of the stairs and use them. Fix handrails that are broken or loose. Make sure that handrails are as long as the stairways.  Check any carpeting to make sure that it is firmly attached to the stairs. Fix any carpet that is loose or worn.  Avoid having throw rugs at the top or bottom of the stairs. If you do have throw rugs, attach them to the floor with carpet tape.  Make sure that you have a light switch at the top of the stairs and the bottom of the stairs. If you do not have them, ask someone to add them for you. What else can I do to help prevent falls?  Wear shoes that:  Do not have high heels.  Have rubber bottoms.  Are comfortable and fit you well.  Are closed at the toe. Do not wear sandals.  If you use a stepladder:  Make sure that it is fully opened. Do not climb a closed stepladder.  Make sure that both sides of the stepladder are locked into place.  Ask someone to hold it for you, if  possible.  Clearly mark and make sure that you can see:  Any grab bars or handrails.  First and last steps.  Where the edge of each step is.  Use tools that help you move around (mobility aids) if they are needed. These include:  Canes.  Walkers.  Scooters.  Crutches.  Turn on the lights when you go into a dark area. Replace any light bulbs as soon as they burn out.  Set up your furniture so you have a clear path. Avoid moving your furniture around.  If any of your floors are uneven, fix them.  If there are any pets around you, be aware of where they are.  Review your medicines with your doctor. Some medicines can make you feel dizzy. This can increase your chance of falling. Ask your doctor what other things that you can do to help prevent falls. This information is not intended to replace advice given to you by your health care provider. Make sure you discuss any questions you have with your health care provider. Document Released: 09/29/2009 Document Revised: 05/10/2016 Document Reviewed: 01/07/2015 Elsevier Interactive Patient Education  2017 [employer].

## 2018-10-08 ENCOUNTER — Encounter: Payer: Medicare HMO | Admitting: Family Medicine

## 2018-10-09 LAB — CBC AND DIFFERENTIAL
HCT: 30 — AB (ref 36–46)
Hemoglobin: 10.1 — AB (ref 12.0–16.0)
PLATELETS: 263 (ref 150–399)
WBC: 6

## 2018-10-13 ENCOUNTER — Encounter: Payer: Self-pay | Admitting: Family

## 2018-10-13 ENCOUNTER — Non-Acute Institutional Stay (SKILLED_NURSING_FACILITY): Payer: Medicare HMO | Admitting: Family

## 2018-10-13 DIAGNOSIS — I5032 Chronic diastolic (congestive) heart failure: Secondary | ICD-10-CM | POA: Diagnosis not present

## 2018-10-13 DIAGNOSIS — F418 Other specified anxiety disorders: Secondary | ICD-10-CM

## 2018-10-13 DIAGNOSIS — R634 Abnormal weight loss: Secondary | ICD-10-CM

## 2018-10-13 DIAGNOSIS — I110 Hypertensive heart disease with heart failure: Secondary | ICD-10-CM | POA: Diagnosis not present

## 2018-10-13 DIAGNOSIS — E871 Hypo-osmolality and hyponatremia: Secondary | ICD-10-CM | POA: Diagnosis not present

## 2018-10-13 DIAGNOSIS — I480 Paroxysmal atrial fibrillation: Secondary | ICD-10-CM | POA: Diagnosis not present

## 2018-10-13 DIAGNOSIS — J9611 Chronic respiratory failure with hypoxia: Secondary | ICD-10-CM

## 2018-10-13 DIAGNOSIS — R69 Illness, unspecified: Secondary | ICD-10-CM | POA: Diagnosis not present

## 2018-10-14 NOTE — Progress Notes (Signed)
Location:  [facility] Room Number: 31 Place of Service:  SNF (31) Provider: Dinah Ngetich FNP-C   Ngetich, Nelda Bucks, NP  Patient Care Team: Ngetich, Nelda Bucks, NP as PCP - General (Family Medicine) Lannette Donath Glo Herring., MD as Referring Physician (Internal Medicine) Satira Sark, MD as Consulting Physician (Cardiology) Kyung Rudd, MD as Consulting Physician (Radiation Oncology)  Extended Emergency Contact Information Primary Emergency Contact: Neita Garnet of [place] Phone: [phone] Relation: Son Secondary Emergency Contact: Kenner,Lance Address: [address]          [place], [place] [address] [name] of [place] Phone: [phone] Mobile Phone: [phone] Relation: Son  Code Status: DNR Goals of care: Advanced Directive information Advanced Directives 10/13/2018  Does Patient Have a Medical Advance Directive? Yes  Type of Paramedic of [place];Living will;Out of facility DNR (pink MOST or yellow form)  Does patient want to make changes to medical advance directive? No - Patient declined  Copy of [place] in Chart? Yes  Would patient like information on creating a medical advance directive? -  Pre-existing out of facility DNR order (yellow form or pink MOST form) Yellow form placed in chart (order not valid for inpatient use)     Chief Complaint  Patient presents with  . Medical Management of Chronic Issues    Routine Visit    HPI:  Pt is a 84 y.o. female seen today [place] for medical management of chronic diseases.she has a medical history of HTN,Afib,CHF,chronic respiratory failure with hypoxia,lung cancer,chronic hyponatremia,depression with anxiety among other conditions.she is seen in her room today with facility Nurse present at bedside.she states feeling much better since starting on antibiotics for pneumonia.she denies any fever,chills,wheezing or  shortness of breath.she has been able to keep her oxygen off with oxygen saturation > 90% on room air.she is on doxycycline 100 mg tablet twice daily until 10/17/2018 for pneumonia.she has had 6 lbs weight loss over one month with a total of 10 lbs weight over three months.Facility registered dietician continue to follow up currently on protein supplement twice daily.No recent fall episodes.       Past Medical History:  Diagnosis Date  . Allergic rhinitis   . Anemia    NOS  . Anxiety   . Biceps tendon rupture    Bilateral  . Breast cancer ([place]) 2001   Left s/p lumpectomy and XRT   . Bronchiectasis ([place]) 2014   Noted on chest x-ray  . Complication of anesthesia    slow to wake up  . Cough 2014  . Depression   . Diverticulosis   . Elevated liver enzymes    Biopsy consistent with steatohepatitis  . Fundic gland polyps of stomach, benign   . GERD (gastroesophageal reflux disease) 06/2007   EGD Dr Gala Romney >sm HH, multiple fundic gland polyps  . Glaucoma 2013   both eyes  . Hemorrhoids   . Hiatal hernia   . Hx of radiation therapy 07/17/10 to 07/21/10   SRS LLL lung  . Hyperlipidemia   . Insomnia   . Low back pain    scoliosis  . Lung cancer ([place]) 05/2010   Left 2011 - rad x 3  . Lymphedema    Left arm  . Osteoarthritis   . Osteopenia 10/25/2016  . Osteoporosis, senile   . Overactive bladder   . Pneumonia    RLL with sepsis  . PSVT (paroxysmal supraventricular tachycardia) ([place])   .  Rheumatic fever    Age 51  . Right thyroid nodule   . Steatohepatitis    liver bx  . Type 2 diabetes mellitus ([place])    Past Surgical History:  Procedure Laterality Date  . ABDOMINAL HYSTERECTOMY  1964  . APPENDECTOMY  1964  . BIOPSY THYROID  11/2008  . BREAST BIOPSY     X 3 w/ cystectomy  . BREAST LUMPECTOMY  2001  . CATARACT EXTRACTION Bilateral 2003   Lens implant Dr. Charise Killian  . CHOLECYSTECTOMY  1965  . COLONOSCOPY  09/11/2011   Procedure: COLONOSCOPY;  Surgeon: Dorothyann Peng, MD;   [facility];  Service: Endoscopy;  Laterality: N/A;  . COLONOSCOPY  2005 /2012   Dr. Lucianne Muss- diverticulosis, ext hemorrhoids.  . COLONOSCOPY WITH PROPOFOL N/A 11/09/2017   Procedure: COLONOSCOPY WITH PROPOFOL;  Surgeon: Mauri Pole, MD;  Location: [facility];  Service: Endoscopy;  Laterality: N/A;  . COLONOSCOPY WITH PROPOFOL N/A 11/12/2017   Procedure: COLONOSCOPY WITH PROPOFOL;  Surgeon: Ladene Artist, MD;  Location: [facility];  Service: Endoscopy;  Laterality: N/A;  . CYSTOSCOPY  2007  . ESOPHAGOGASTRODUODENOSCOPY  09/11/2011   Procedure: ESOPHAGOGASTRODUODENOSCOPY (EGD);  Surgeon: Dorothyann Peng, MD;  [facility];  Service: Endoscopy;  Laterality: N/A;  . ESOPHAGOGASTRODUODENOSCOPY (EGD) WITH PROPOFOL N/A 11/08/2017   Procedure: ESOPHAGOGASTRODUODENOSCOPY (EGD) WITH PROPOFOL;  Surgeon: Mauri Pole, MD;  Location: [facility];  Service: Endoscopy;  Laterality: N/A;  . LIVER BIOPSY  2008  . LUNG BIOPSY Left 06/06/2010  . REVISION TOTAL HIP ARTHROPLASTY  2007   Left  . SKIN CANCER EXCISION     nose and left lower cheek  . TONSILLECTOMY  1930  . TOTAL HIP ARTHROPLASTY Bilateral 2005 & 2007    Dr. Earl Lagos. Aline Brochure     Allergies  Allergen Reactions  . Augmentin [Amoxicillin-Pot Clavulanate] Other (See Comments)    unknown  . Ibuprofen Hives  . Naproxen Other (See Comments)    unknown  . Statins Other (See Comments)    Feels like knives in stomach  . Surgilube [Gyne-Moistrin]     Rectal itching, burning  . Tape Rash    Allergies as of 10/13/2018      Reactions   Augmentin [amoxicillin-pot Clavulanate] Other (See Comments)   unknown   Ibuprofen Hives   Naproxen Other (See Comments)   unknown   Statins Other (See Comments)   Feels like knives in stomach   Surgilube [gyne-moistrin]    Rectal itching, burning   Tape Rash      Medication List        Accurate as of 10/13/18 11:59 PM. Always use your most recent med list.            acetaminophen 500 MG tablet Commonly known as:  TYLENOL Take 1,000 mg by mouth 2 (two) times daily.   acetaminophen 500 MG tablet Commonly known as:  TYLENOL Take 500 mg by mouth daily.   atenolol 25 MG tablet Commonly known as:  TENORMIN Take 1 tablet (25 mg total) by mouth 2 (two) times daily.   B Complex-B12 Tabs Take 1 tablet by mouth daily.   BIOFREEZE 4 % Gel Generic drug:  Menthol (Topical Analgesic) Apply 1 application topically every 8 (eight) hours. Apply to both knees   cetirizine 10 MG tablet Commonly known as:  ZYRTEC Take 10 mg by mouth daily.   doxycycline 100 MG capsule Commonly known as:  VIBRAMYCIN Take 100 mg by mouth 2 (  two) times daily.   DULoxetine 30 MG capsule Commonly known as:  CYMBALTA Take 30 mg by mouth daily.   ferrous gluconate 240 (27 FE) MG tablet Commonly known as:  FERGON Take 240 mg by mouth daily.   folic acid 616 MCG tablet Commonly known as:  FOLVITE Take 400 mcg by mouth daily.   gabapentin 100 MG capsule Commonly known as:  NEURONTIN Take 100 mg by mouth 2 (two) times daily.   ipratropium-albuterol 0.5-2.5 (3) MG/3ML Soln Commonly known as:  DUONEB Take 3 mLs by nebulization 2 (two) times daily. chronic respiratory failure   latanoprost 0.005 % ophthalmic solution Commonly known as:  XALATAN Place 1 drop into both eyes at bedtime.   lidocaine 5 % Commonly known as:  LIDODERM Place 1 patch onto the skin daily as needed. Remove & Discard patch within 12 hours or as directed by MD   omeprazole 20 MG capsule Commonly known as:  PRILOSEC TAKE 1 CAPSULE EVERY DAY.   OXYGEN Inhale 1 L into the lungs continuous.   polyethylene glycol packet Commonly known as:  MIRALAX / GLYCOLAX Take 17 g by mouth daily as needed.   PRESERVISION AREDS 2 PO Take 1 capsule by mouth 2 (two) times daily.   saccharomyces boulardii 250 MG capsule Commonly known as:  FLORASTOR Take 250 mg by mouth daily as needed. Take same  day as any antibiotic   saccharomyces boulardii 250 MG capsule Commonly known as:  FLORASTOR Take 250 mg by mouth 2 (two) times daily. ABT associated diarrhea   Simethicone 80 MG Tabs Take 1 tablet by mouth 4 (four) times daily as needed.   sodium chloride 1 g tablet Take 1 g by mouth daily.   zinc oxide 20 % ointment Apply 1 application topically 3 (three) times daily as needed for irritation. Apply buttocks after every incontinent episode and as needed. May keep at bedside.       Review of Systems  Constitutional: Positive for unexpected weight change. Negative for appetite change, chills, fatigue and fever.  HENT: Positive for hearing loss and trouble swallowing. Negative for congestion, rhinorrhea, sinus pressure, sinus pain, sneezing and sore throat.   Eyes: Positive for visual disturbance. Negative for discharge, redness and itching.  Respiratory: Negative for chest tightness, shortness of breath and wheezing.        Chronic respiratory failure   Cardiovascular: Negative for chest pain, palpitations and leg swelling.  Gastrointestinal: Negative for abdominal distention, abdominal pain, constipation, diarrhea, nausea and vomiting.  Endocrine: Negative for cold intolerance, heat intolerance, polydipsia, polyphagia and polyuria.  Genitourinary: Negative for dysuria, flank pain, frequency and urgency.  Musculoskeletal: Positive for gait problem.  Skin: Negative for color change, pallor, rash and wound.  Neurological: Negative for dizziness, light-headedness and headaches.  Hematological: Does not bruise/bleed easily.  Psychiatric/Behavioral: Negative for agitation, confusion and sleep disturbance. The patient is not nervous/anxious.     Immunization History  Administered Date(s) Administered  . Influenza Whole 09/22/2008, 09/16/2012, 09/30/2013  . Influenza, High Dose Seasonal PF 09/25/2017  . Influenza,inj,Quad PF,6+ Mos 10/09/2018  . Influenza-Unspecified 10/02/2014,  09/15/2015, 09/27/2016  . Pneumococcal Conjugate-13 11/29/2017  . Pneumococcal Polysaccharide-23 08/17/2006  . Td 12/17/2005   Pertinent  Health Maintenance Due  Topic Date Due  . FOOT EXAM  07/19/1931  . OPHTHALMOLOGY EXAM  07/19/1931  . URINE MICROALBUMIN  04/30/2008  . HEMOGLOBIN A1C  10/17/2017  . INFLUENZA VACCINE  Completed  . DEXA SCAN  Completed  . PNA vac Low  Risk Adult  Completed   Fall Risk  10/07/2018 09/19/2017 05/17/2016 04/20/2016 04/19/2016  Falls in the past year? Yes No No No No  Number falls in past yr: 1 - - - -  Injury with Fall? No - - - -    Vitals:   10/13/18 1031  BP: (!) 154/66  Pulse: (!) 58  Resp: 16  Temp: 98 F (36.7 C)  TempSrc: Oral  SpO2: 95%  Weight: 135 lb 8 oz (61.5 kg)  Height: 5' 4"  (1.626 m)   Body mass index is 23.26 kg/m. Physical Exam  Constitutional:  Frail elderly in no acute distress   HENT:  Head: Normocephalic.  Right Ear: External ear normal.  Left Ear: External ear normal.  Mouth/Throat: Oropharynx is clear and moist. No oropharyngeal exudate.  Hearing aids in place   Eyes: Pupils are equal, round, and reactive to light. Conjunctivae and EOM are normal. Right eye exhibits no discharge. Left eye exhibits no discharge. No scleral icterus.  Eye glasses in place   Neck: Normal range of motion. No JVD present. No thyromegaly present.  Cardiovascular: Intact distal pulses. An irregular rhythm present. Exam reveals no gallop and no friction rub.  No murmur heard. Pulmonary/Chest: Effort normal. No respiratory distress. She has no wheezes. She has no rales.  Poor air entry left lower lobe   Abdominal: Soft. Bowel sounds are normal. She exhibits no distension and no mass. There is no tenderness. There is no rebound and no guarding.  Musculoskeletal: She exhibits no edema or tenderness.  Moves x 4 extremities unsteady gait spends most time on wheelchair.  Lymphadenopathy:    She has no cervical adenopathy.  Neurological: Gait  abnormal.  Alert and oriented to person and place.has difficulties finding words at times.   Skin: Skin is warm and dry. No rash noted. No erythema. No pallor.  Skin intact   Psychiatric: She has a normal mood and affect. Her behavior is normal. Judgment and thought content normal. She exhibits abnormal recent memory.  Nursing note and vitals reviewed.   Labs reviewed: Recent Labs    08/09/18 1519 08/10/18 0053 08/12/18 0318  08/19/18 08/21/18 08/25/18 09/25/18  NA 140 138 133*   < > 128 125 133 140  K 5.1 4.1 3.6   < > 3.5 3.4 3.8 3.3*  CL 102 97* 93*  --   --   --   --   --   CO2 29 26 28   --   --   --   --   --   GLUCOSE 101* 87 78  --   --   --   --   --   BUN 17 17 23   --  21 25* 20 18  CREATININE 0.92 0.99 0.89  --  0.82 0.80 0.86 0.7  CALCIUM 9.7 9.8 8.9  --  8.8 8.3 8.7  --    < > = values in this interval not displayed.   Recent Labs    08/19/18 08/21/18 08/25/18 09/25/18  AST 58 45 32 18  ALT 58 47 29 11  ALKPHOS 122 134 122 139*  BILITOT 0.8 0.6 0.4  --   PROT 6.4 6.2 6.1  --   ALBUMIN 2.8 2.7 2.6  --    Recent Labs    01/16/18 0842  08/09/18 1519 08/19/18 09/25/18 10/09/18  WBC 10.3   < > 7.1 9.8 3.1 6.0  NEUTROABS  --   --  5.8  --   --   --  HGB 11.0*   < > 12.2 11.6 9.9* 10.1*  HCT 32.7*   < > 36.5 35.3 29* 30*  MCV 88.4  --  92.9 93.6  --   --   PLT 278   < > 297  --  211 263   < > = values in this interval not displayed.   Lab Results  Component Value Date   TSH 2.321 08/10/2018   Lab Results  Component Value Date   HGBA1C 5.3 04/16/2017   Lab Results  Component Value Date   CHOL 188 04/12/2016   HDL 62 04/12/2016   LDLCALC 102 04/12/2016   TRIG 122 04/12/2016   CHOLHDL 3.0 Ratio 06/07/2008    Significant Diagnostic Results in last 30 days:  No results found.  Assessment/Plan 1. Hypertensive heart disease with chronic diastolic congestive heart failure (HCC) B/p stable.continue on atenolol 25 mg tablet twice daily.monitor BMP  2.  Paroxysmal atrial fibrillation (HCC) HR irregular.continue on atenolol 25 mg tablet twice daily  3. Chronic diastolic CHF (congestive heart failure) (HCC) No abrupt weight gain,edema or shortness of breath.continue to monitor weight.  4. Chronic respiratory failure with hypoxia (HCC) Breathing stable.off oxygen with sats> 90%.poor air entry to left lower lobe.continue Duoneb twice daily and oxygen to keep oxygen saturation > 90% continue on doxycycline 100 mg tablet twice daily until 10/17/2018 for pneumonia.   5. Chronic Hyponatremia Recent Na+ 140 (09/25/2018).continue on sodium chloride 1 gm daily.Monitor BMP monthly.   6. Depression with anxiety Mood stable.continue on duloxetine 100 mg capsule twice daily.monitor for mood changes.  7.Weight loss  Has had  6 lbs weight loss over one month with a total of 10 lbs weight over three months.has hx of lung cancer which could be a contributory factor.continue on protein supplement.Registered Dietician to continue to follow up.  Family/ staff Communication: Reviewed plan of care with patient and facility Nurse  Labs/tests ordered: None   Nelda Bucks Ngetich, NP

## 2018-10-15 ENCOUNTER — Non-Acute Institutional Stay (SKILLED_NURSING_FACILITY): Payer: Medicare HMO | Admitting: Family

## 2018-10-15 ENCOUNTER — Encounter: Payer: Self-pay | Admitting: Family

## 2018-10-15 DIAGNOSIS — I110 Hypertensive heart disease with heart failure: Secondary | ICD-10-CM

## 2018-10-15 DIAGNOSIS — J9611 Chronic respiratory failure with hypoxia: Secondary | ICD-10-CM | POA: Diagnosis not present

## 2018-10-15 DIAGNOSIS — I480 Paroxysmal atrial fibrillation: Secondary | ICD-10-CM | POA: Diagnosis not present

## 2018-10-15 DIAGNOSIS — I5032 Chronic diastolic (congestive) heart failure: Secondary | ICD-10-CM | POA: Diagnosis not present

## 2018-10-15 DIAGNOSIS — K219 Gastro-esophageal reflux disease without esophagitis: Secondary | ICD-10-CM

## 2018-10-15 DIAGNOSIS — E871 Hypo-osmolality and hyponatremia: Secondary | ICD-10-CM

## 2018-10-15 DIAGNOSIS — E611 Iron deficiency: Secondary | ICD-10-CM | POA: Diagnosis not present

## 2018-10-15 DIAGNOSIS — R2681 Unsteadiness on feet: Secondary | ICD-10-CM

## 2018-10-15 DIAGNOSIS — F418 Other specified anxiety disorders: Secondary | ICD-10-CM

## 2018-10-15 DIAGNOSIS — R69 Illness, unspecified: Secondary | ICD-10-CM | POA: Diagnosis not present

## 2018-10-15 NOTE — Progress Notes (Signed)
Location:  [facility] Room Number: [place] of Service:  SNF (31)  Provider: Marlowe Sax FNP-C   PCP: Sandrea Hughs, NP Patient Care Team: Le Ferraz, Nelda Bucks, NP as PCP - General (Family Medicine) Lanelle Bal., MD as Referring Physician (Internal Medicine) Satira Sark, MD as Consulting Physician (Cardiology) Kyung Rudd, MD as Consulting Physician (Radiation Oncology)  Extended Emergency Contact Information Primary Emergency Contact: Neita Garnet of [place] Phone: [phone] Relation: Son Secondary Emergency Contact: Clemenson,Lance Address: [address]          [place], [place] [address] [name] of [place] Phone: [phone] Mobile Phone: [phone] Relation: Son  Code Status: DNR Goals of care:  Advanced Directive information Advanced Directives 10/15/2018  Does Patient Have a Medical Advance Directive? Yes  Type of Paramedic of [place];Living will;Out of facility DNR (pink MOST or yellow form)  Does patient want to make changes to medical advance directive? No - Patient declined  Copy of [place] in Chart? Yes  Would patient like information on creating a medical advance directive? -  Pre-existing out of facility DNR order (yellow form or pink MOST form) Pink MOST form placed in chart (order not valid for inpatient use)     Allergies  Allergen Reactions  . Augmentin [Amoxicillin-Pot Clavulanate] Other (See Comments)    unknown  . Ibuprofen Hives  . Naproxen Other (See Comments)    unknown  . Statins Other (See Comments)    Feels like knives in stomach  . Surgilube [Gyne-Moistrin]     Rectal itching, burning  . Tape Rash    Chief Complaint  Patient presents with  . Discharge Note    Discahrge from [facility] to [facility] assisted living facility    HPI:  84 y.o. female seen today at [facility] skilled Nursing facility for discharge to [facility].she was here for short term rehabilitation for post hospital admission 08/09/2018-08/12/2018 post fall with confusion and generalized weakness.she was residing at [facility] prior to hospital admission.She was treated with I.V antibiotics for UTI.Her acute encephalopathy was unclear but there was concern for for dehydration due to lasix and Remeron.Therefore,lasix and Remeron were discontinued.Her CXR results showed left pleural effusion and central vascular congestion.she was seen by cardiology ECHO showed chronic diastolic CHF thought to be contributing to her  dyspnea.Medical management recommended.She had a CT and MRI brain showed no acute abnormalities.she was discharged to [facility] for rehab on oral azithromycin for left lobe pneumonia.  During her stay here in rehab on 09/24/2018 she complained of cough and congestion .Chest X-ray done showed moderate asymmetric fluid overload and infiltrate.on call provider was notified oral Avalox 400 mg tablet for seven days was given.she completed antibiotics without relief.On 10/07/2018 she complained of worsening cough and congestion.Her CXR showed left basilar pneumonia with small pleural effusion.Lasix 20 mg tablet x 1 dose and Doxycyline 100 mg tablet twice daily x 10 days given.she will complete her doxycycline on 10/17/2018.she states shortness of breath and cough has improved.she requested for her oxygen to be discontinued states does not want to be dependent.Her oxygen saturation has been > 90% on Room air.Oxygen to 1 liters via Nasal cannula as needed.she denies any acute issues during visit.she received her flu shot and Tdap vaccine during her stay in rehab.she was also started on sodium chloride 1 gm due to her persistent hyponatremia with much improvement.Her BMP  checked monthly and sodium remains stable. she has worked well with PT/OT now stable for discharge back to ALF.  She will be discharged  PT/OT  to continue with ROM, Exercise, Gait stability and muscle strengthening.She does not require any new DME states has own  Environmental consultant and Power wheelchair.she states uses power wheelchair for long distance especially going to the dinning room.Discharge process will be arranged by facility social worker prior to discharge. She will be discharged with her prescription medication from the facility.She will  follow up with [facility]  PCP in 1-2 weeks. Facility staff report no new concerns.No recent fall episodes since hospital admission.she has had a 10 pound weight loss over the past three months.she currently on protein supplement twice daily and  follows up with facility Registered Dietician.    Past Medical History:  Diagnosis Date  . Allergic rhinitis   . Anemia    NOS  . Anxiety   . Biceps tendon rupture    Bilateral  . Breast cancer ([place]) 2001   Left s/p lumpectomy and XRT   . Bronchiectasis ([place]) 2014   Noted on chest x-ray  . Complication of anesthesia    slow to wake up  . Cough 2014  . Depression   . Diverticulosis   . Elevated liver enzymes    Biopsy consistent with steatohepatitis  . Fundic gland polyps of stomach, benign   . GERD (gastroesophageal reflux disease) 06/2007   EGD Dr Gala Romney >sm HH, multiple fundic gland polyps  . Glaucoma 2013   both eyes  . Hemorrhoids   . Hiatal hernia   . Hx of radiation therapy 07/17/10 to 07/21/10   SRS LLL lung  . Hyperlipidemia   . Insomnia   . Low back pain    scoliosis  . Lung cancer ([place]) 05/2010   Left 2011 - rad x 3  . Lymphedema    Left arm  . Osteoarthritis   . Osteopenia 10/25/2016  . Osteoporosis, senile   . Overactive bladder   . Pneumonia    RLL with sepsis  . PSVT (paroxysmal supraventricular tachycardia) ([place])   . Rheumatic fever    Age 16  . Right thyroid nodule   . Steatohepatitis    liver bx  . Type 2 diabetes mellitus ([place])     Past Surgical History:  Procedure  Laterality Date  . ABDOMINAL HYSTERECTOMY  1964  . APPENDECTOMY  1964  . BIOPSY THYROID  11/2008  . BREAST BIOPSY     X 3 w/ cystectomy  . BREAST LUMPECTOMY  2001  . CATARACT EXTRACTION Bilateral 2003   Lens implant Dr. Charise Killian  . CHOLECYSTECTOMY  1965  . COLONOSCOPY  09/11/2011   Procedure: COLONOSCOPY;  Surgeon: Dorothyann Peng, MD;  Location: [facility];  Service: Endoscopy;  Laterality: N/A;  . COLONOSCOPY  2005 /2012   Dr. Lucianne Muss- diverticulosis, ext hemorrhoids.  . COLONOSCOPY WITH PROPOFOL N/A 11/09/2017   Procedure: COLONOSCOPY WITH PROPOFOL;  Surgeon: Mauri Pole, MD;  Location: [facility];  Service: Endoscopy;  Laterality: N/A;  . COLONOSCOPY WITH PROPOFOL N/A 11/12/2017   Procedure: COLONOSCOPY WITH PROPOFOL;  Surgeon: Ladene Artist, MD;  Location: [facility];  Service: Endoscopy;  Laterality: N/A;  . CYSTOSCOPY  2007  . ESOPHAGOGASTRODUODENOSCOPY  09/11/2011   Procedure: ESOPHAGOGASTRODUODENOSCOPY (EGD);  Surgeon: Dorothyann Peng, MD;  [facility];  Service: Endoscopy;  Laterality: N/A;  . ESOPHAGOGASTRODUODENOSCOPY (EGD) WITH PROPOFOL N/A 11/08/2017  Procedure: ESOPHAGOGASTRODUODENOSCOPY (EGD) WITH PROPOFOL;  Surgeon: Mauri Pole, MD;  Location: [facility];  Service: Endoscopy;  Laterality: N/A;  . LIVER BIOPSY  2008  . LUNG BIOPSY Left 06/06/2010  . REVISION TOTAL HIP ARTHROPLASTY  2007   Left  . SKIN CANCER EXCISION     nose and left lower cheek  . TONSILLECTOMY  1930  . TOTAL HIP ARTHROPLASTY Bilateral 2005 & 2007    Dr. Earl Lagos. Aline Brochure       reports that she has never smoked. She has never used smokeless tobacco. She reports that she does not drink alcohol or use drugs. Social History   Socioeconomic History  . Marital status: Widowed    Spouse name: Not on file  . Number of children: 2  . Years of education: college   . Highest education level: Not on file  Occupational History  . Occupation: retired Tax Customer service manager: RETIRED  Social Needs  . Financial resource strain: Not hard at all  . Food insecurity:    Worry: Never true    Inability: Never true  . Transportation needs:    Medical: No    Non-medical: No  Tobacco Use  . Smoking status: Never Smoker  . Smokeless tobacco: Never Used  . Tobacco comment: 2nd hand-husband  Substance and Sexual Activity  . Alcohol use: No  . Drug use: No  . Sexual activity: Not Currently  Lifestyle  . Physical activity:    Days per week: 7 days    Minutes per session: 30 min  . Stress: Not at all  Relationships  . Social connections:    Talks on phone: More than three times a week    Gets together: More than three times a week    Attends religious service: Never    Active member of club or organization: No    Attends meetings of clubs or organizations: Never    Relationship status: Widowed  . Intimate partner violence:    Fear of current or ex partner: No    Emotionally abused: No    Physically abused: No    Forced sexual activity: No  Other Topics Concern  . Not on file  Social History Narrative   Lives at [facility] 02/2013   2 adopted children   Was in [place], [place]   Widowed 2013   Walks with walker   Exercise: none    POA - Living Will    Allergies  Allergen Reactions  . Augmentin [Amoxicillin-Pot Clavulanate] Other (See Comments)    unknown  . Ibuprofen Hives  . Naproxen Other (See Comments)    unknown  . Statins Other (See Comments)    Feels like knives in stomach  . Surgilube [Gyne-Moistrin]     Rectal itching, burning  . Tape Rash    Pertinent  Health Maintenance Due  Topic Date Due  . FOOT EXAM  07/19/1931  . OPHTHALMOLOGY EXAM  07/19/1931  . URINE MICROALBUMIN  04/30/2008  . HEMOGLOBIN A1C  10/17/2017  . INFLUENZA VACCINE  Completed  . DEXA SCAN  Completed  . PNA vac Low Risk Adult  Completed    Medications: Outpatient Encounter Medications as of 10/15/2018  Medication Sig  . acetaminophen  (TYLENOL) 500 MG tablet Take 1,000 mg by mouth 2 (two) times daily.  Marland Kitchen acetaminophen (TYLENOL) 500 MG tablet Take 500 mg by mouth daily.  Marland Kitchen atenolol (TENORMIN) 25 MG tablet Take 1 tablet (25 mg total) by mouth  2 (two) times daily.  . B Complex Vitamins (B COMPLEX-B12) TABS Take 1 tablet by mouth daily.  . cetirizine (ZYRTEC) 10 MG tablet Take 10 mg by mouth daily.  Marland Kitchen doxycycline (VIBRAMYCIN) 100 MG capsule Take 100 mg by mouth 2 (two) times daily.  . DULoxetine (CYMBALTA) 30 MG capsule Take 30 mg by mouth daily.  . ferrous gluconate (FERGON) 240 (27 FE) MG tablet Take 240 mg by mouth daily.  . folic acid (FOLVITE) 481 MCG tablet Take 400 mcg by mouth daily.  Marland Kitchen gabapentin (NEURONTIN) 100 MG capsule Take 100 mg by mouth 2 (two) times daily.  Marland Kitchen ipratropium-albuterol (DUONEB) 0.5-2.5 (3) MG/3ML SOLN Take 3 mLs by nebulization 2 (two) times daily. chronic respiratory failure  . latanoprost (XALATAN) 0.005 % ophthalmic solution Place 1 drop into both eyes at bedtime.  . lidocaine (LIDODERM) 5 % Place 1 patch onto the skin daily as needed. Remove & Discard patch within 12 hours or as directed by MD   . Menthol, Topical Analgesic, (BIOFREEZE) 4 % GEL Apply 1 application topically every 8 (eight) hours. Apply to both knees  . Multiple Vitamins-Minerals (PRESERVISION AREDS 2 PO) Take 1 capsule by mouth 2 (two) times daily.   Marland Kitchen omeprazole (PRILOSEC) 20 MG capsule TAKE 1 CAPSULE EVERY DAY.  Marland Kitchen OXYGEN Inhale 1 L into the lungs continuous.  . polyethylene glycol (MIRALAX / GLYCOLAX) packet Take 17 g by mouth daily as needed.   . saccharomyces boulardii (FLORASTOR) 250 MG capsule Take 250 mg by mouth 2 (two) times daily. ABT associated diarrhea  . saccharomyces boulardii (FLORASTOR) 250 MG capsule Take 250 mg by mouth daily as needed. Take same day as any antibiotic  . Simethicone 80 MG TABS Take 1 tablet by mouth 4 (four) times daily as needed.  . sodium chloride 1 g tablet Take 1 g by mouth daily.  Marland Kitchen zinc  oxide 20 % ointment Apply 1 application topically 3 (three) times daily as needed for irritation. Apply buttocks after every incontinent episode and as needed. May keep at bedside.   No facility-administered encounter medications on file as of 10/15/2018.      Review of Systems  Constitutional: Negative for appetite change, chills, fatigue and fever.       Weight loss per HPI   HENT: Positive for hearing loss. Negative for congestion, rhinorrhea, sinus pressure, sinus pain, sneezing and sore throat.   Eyes: Positive for visual disturbance. Negative for pain, discharge and redness.  Respiratory: Negative for cough, chest tightness, shortness of breath and wheezing.   Cardiovascular: Negative for chest pain, palpitations and leg swelling.  Gastrointestinal: Negative for abdominal distention, abdominal pain, constipation, diarrhea and nausea.  Endocrine: Negative for cold intolerance, heat intolerance, polydipsia, polyphagia and polyuria.  Genitourinary: Negative for dysuria, flank pain, frequency and urgency.  Musculoskeletal: Positive for gait problem.  Skin: Negative for color change, pallor, rash and wound.  Neurological: Negative for dizziness, weakness, light-headedness and headaches.  Hematological: Does not bruise/bleed easily.  Psychiatric/Behavioral: Negative for agitation, confusion and sleep disturbance. The patient is not nervous/anxious.     Vitals:   10/15/18 1122  BP: (!) 158/73  Pulse: 73  Resp: 18  Temp: 98.5 F (36.9 C)  TempSrc: Oral  SpO2: 95%  Weight: 135 lb 8 oz (61.5 kg)  Height: 5' 4"  (1.626 m)   Body mass index is 23.26 kg/m. Physical Exam  Constitutional:  Frail elderly in no acute distress   HENT:  Head: Normocephalic.  Right Ear: External  ear normal.  Left Ear: External ear normal.  Mouth/Throat: Oropharynx is clear and moist. No oropharyngeal exudate.  Hearing aids in place  Eyes: Pupils are equal, round, and reactive to light. Conjunctivae and  EOM are normal. Right eye exhibits no discharge. Left eye exhibits no discharge. No scleral icterus.  Corrective lens in place.  Neck: Normal range of motion. No JVD present. No thyromegaly present.  Cardiovascular: Intact distal pulses. An irregular rhythm present. Exam reveals no gallop and no friction rub.  No murmur heard. Respiratory: Effort normal. No respiratory distress. She has no wheezes. She has no rales.  Poor air entry to left lung  GI: Soft. Bowel sounds are normal. She exhibits no distension and no mass. There is no tenderness. There is no rebound and no guarding.  Musculoskeletal: She exhibits no edema or tenderness.  Moves x 4 extremities.unsteady gait ambulates short distance with front wheel walker and self propel on wheelchair.  Lymphadenopathy:    She has no cervical adenopathy.  Neurological: A sensory deficit is present. Gait abnormal.  Alert and oriented to person and place.Heard of hearing wears bilateral hearing aids   Skin: Skin is warm and dry. No rash noted. No erythema. No pallor.  Skin intact   Psychiatric: She has a normal mood and affect. Her speech is normal and behavior is normal. Judgment and thought content normal.     Labs reviewed: Basic Metabolic Panel: Recent Labs    08/09/18 1519 08/10/18 0053 08/12/18 0318  08/19/18 08/21/18 08/25/18 09/25/18  NA 140 138 133*   < > 128 125 133 140  K 5.1 4.1 3.6   < > 3.5 3.4 3.8 3.3*  CL 102 97* 93*  --   --   --   --   --   CO2 29 26 28   --   --   --   --   --   GLUCOSE 101* 87 78  --   --   --   --   --   BUN 17 17 23   --  21 25* 20 18  CREATININE 0.92 0.99 0.89  --  0.82 0.80 0.86 0.7  CALCIUM 9.7 9.8 8.9  --  8.8 8.3 8.7  --    < > = values in this interval not displayed.   Liver Function Tests: Recent Labs    08/19/18 08/21/18 08/25/18 09/25/18  AST 58 45 32 18  ALT 58 47 29 11  ALKPHOS 122 134 122 139*  BILITOT 0.8 0.6 0.4  --   PROT 6.4 6.2 6.1  --   ALBUMIN 2.8 2.7 2.6  --      Recent  Labs    08/10/18 0053  AMMONIA 25   CBC: Recent Labs    01/16/18 0842  08/09/18 1519 08/19/18 09/25/18 10/09/18  WBC 10.3   < > 7.1 9.8 3.1 6.0  NEUTROABS  --   --  5.8  --   --   --   HGB 11.0*   < > 12.2 11.6 9.9* 10.1*  HCT 32.7*   < > 36.5 35.3 29* 30*  MCV 88.4  --  92.9 93.6  --   --   PLT 278   < > 297  --  211 263   < > = values in this interval not displayed.   CBG: Recent Labs    11/06/17 1122 11/06/17 1524 11/09/17 0732  GLUCAP 91 89 80    Procedures and Imaging Studies During Stay:  No results found.  Assessment/Plan:    1. Unsteady gait  Has worked well with PT/ OT. Will discharge to ALF with  PT/OT to continue with ROM, Exercise, Gait stability and muscle strengthening. No new DME required has own FWW,WC and power wheelchair.Fall and safety precautions.  2. Hypertensive heart disease with chronic diastolic congestive heart failure (HCC) B/p stable.continue on Atenolol 25 mg tablet daily.check BMP in 1-2 weeks.   3. Chronic diastolic CHF (congestive heart failure) (HCC) No abrupt weight gain,shortness of breath,edema or wheezing.continue monthly weight check.  4. Iron deficiency Recent Hgb 10.1 ( 10/09/2018) previous 9.9 ( 09/25/2018) continue on ferrous gluconate 240 mg tablet daily,folic acid 561 mcg tablet daily and MVI.check CBC in 1-2 weeks.     5. Paroxysmal atrial fibrillation (HCC) HR irregular.continue on Atenolol 25 mg tablet twice daily.  6. Hyponatremia Recent Na + 140 ( 10/09/2018)The current medical regimen is effective.check BMP in 1-2 weeks then adjust sodium chloride if needed.   7. Depression with anxiety Mood stable.continue on Duloxetine 30 mg capsule daily.monitor for mood changes.  8. Chronic respiratory failure with hypoxia  Breathing stable.Oxygen 1 liters via nasal cannula as needed to keep oxygen saturation > 90 %.current on Doxycycline 100 mg tablet twice daily x 10 days last dose 10/17/2018 for recent left basilar  pneumonia.on probiotics.Received influenza vaccine during rehab for flu prevention.  9. GERD  Asymptomatic.continue on omeprazole 20 mg capsule daily.   Patient is being discharged with the following home health services:   -PT/OT for ROM, exercise, gait stability and muscle strengthening  Patient is being discharged with the following durable medical equipment: - None  Required has own FWW,wheelchair and Power wheelchair.     Patient has been advised to f/u with their PCP in 1-2 weeks to for a transitions of care visit.Social services at their facility was responsible for arranging this appointment. Pt will be discharged with her  prescriptions of noncontrolled medications from the facility.For controlled substances, a limited supply was provided as appropriate for the individual patient. If the pt normally receives these medications from a pain clinic or has a contract with another physician, these medications should be received from that clinic or physician only).    Future labs/tests needed:  CBC, BMP in 1-2 weeks PCP

## 2018-10-20 ENCOUNTER — Encounter: Payer: Self-pay | Admitting: Family Medicine

## 2018-10-20 ENCOUNTER — Non-Acute Institutional Stay: Payer: Medicare HMO | Admitting: Family Medicine

## 2018-10-20 DIAGNOSIS — G9340 Encephalopathy, unspecified: Secondary | ICD-10-CM | POA: Diagnosis not present

## 2018-10-20 DIAGNOSIS — M5440 Lumbago with sciatica, unspecified side: Secondary | ICD-10-CM

## 2018-10-20 DIAGNOSIS — B373 Candidiasis of vulva and vagina: Secondary | ICD-10-CM

## 2018-10-20 DIAGNOSIS — I10 Essential (primary) hypertension: Secondary | ICD-10-CM

## 2018-10-20 DIAGNOSIS — G8929 Other chronic pain: Secondary | ICD-10-CM | POA: Diagnosis not present

## 2018-10-20 DIAGNOSIS — I270 Primary pulmonary hypertension: Secondary | ICD-10-CM | POA: Diagnosis not present

## 2018-10-20 DIAGNOSIS — B3731 Acute candidiasis of vulva and vagina: Secondary | ICD-10-CM

## 2018-10-20 NOTE — Progress Notes (Signed)
Provider:  Alain Honey, MD Location:  [facility] Room Number: 413 Place of Service:  ALF [phone])  PCP: Marlana Latus  NP Patient Care Team: Ngetich, Nelda Bucks, NP as PCP - General (Family Medicine) Lanelle Bal., MD as Referring Physician (Internal Medicine) Satira Sark, MD as Consulting Physician (Cardiology) Kyung Rudd, MD as Consulting Physician (Radiation Oncology)  Extended Emergency Contact Information Primary Emergency Contact: Neita Garnet of [place] Phone: [phone] Relation: Son Secondary Emergency Contact: Prabhu,Lance Address: [address]          [place], [place] [address] [name] of [place] Phone: [phone] Mobile Phone: [phone] Relation: Son  Code Status: DNR Goals of Care: Advanced Directive information Advanced Directives 10/15/2018  Does Patient Have a Medical Advance Directive? Yes  Type of Paramedic of [place];Living will;Out of facility DNR (pink MOST or yellow form)  Does patient want to make changes to medical advance directive? No - Patient declined  Copy of [place] in Chart? Yes  Would patient like information on creating a medical advance directive? -  Pre-existing out of facility DNR order (yellow form or pink MOST form) Pink MOST form placed in chart (order not valid for inpatient use)      Chief Complaint  Patient presents with  . Readmit To SNF    Patient returned to [facility] from [facility]    HPI: Patient is a 84 y.o. female seen today for admission to [facility].  She had been hospitalized admitted to [facility] for rehab about 1 month ago.  Prior to the hospitalization she had been confused probably related to hyponatremia.  She was treated with IV antibiotics for possible UTI.  In the hospital her Lasix and Remeron were discontinued with concern for dehydration.  Chest x-ray showed left pleural  effusion and she was seen by cardiology.  Echocardiogram showed chronic diastolic CHF she also had a CT and MRI of the brain showing no acute abnormalities.  Treatment for acute pneumonia consisted of Avelox for 7 days and then doxycycline.  While in skilled nursing she was started on sodium chloride due to her persistent hyponatremia.  Plan is to check BMP monthly until stable.  She is thought to be back at her baseline abilities.  She ambulates with a motorized scooter and wheelchair in the room.  Over the course of her hospitalization and subsequent stay in [place] she has a 10 pound weight loss.  Currently taking protein supplement twice a day and is to follow-up with dietitian  Past Medical History:  Diagnosis Date  . Allergic rhinitis   . Anemia    NOS  . Anxiety   . Biceps tendon rupture    Bilateral  . Breast cancer ([place]) 2001   Left s/p lumpectomy and XRT   . Bronchiectasis ([place]) 2014   Noted on chest x-ray  . Complication of anesthesia    slow to wake up  . Cough 2014  . Depression   . Diverticulosis   . Elevated liver enzymes    Biopsy consistent with steatohepatitis  . Fundic gland polyps of stomach, benign   . GERD (gastroesophageal reflux disease) 06/2007   EGD Dr Gala Romney >sm HH, multiple fundic gland polyps  . Glaucoma 2013   both eyes  . Hemorrhoids   . Hiatal hernia   . Hx of radiation therapy 07/17/10 to 07/21/10   SRS LLL lung  . Hyperlipidemia   .  Insomnia   . Low back pain    scoliosis  . Lung cancer ([place]) 05/2010   Left 2011 - rad x 3  . Lymphedema    Left arm  . Osteoarthritis   . Osteopenia 10/25/2016  . Osteoporosis, senile   . Overactive bladder   . Pneumonia    RLL with sepsis  . PSVT (paroxysmal supraventricular tachycardia) ([place])   . Rheumatic fever    Age 38  . Right thyroid nodule   . Steatohepatitis    liver bx  . Type 2 diabetes mellitus ([place])    Past Surgical History:  Procedure Laterality Date  . ABDOMINAL HYSTERECTOMY  1964  .  APPENDECTOMY  1964  . BIOPSY THYROID  11/2008  . BREAST BIOPSY     X 3 w/ cystectomy  . BREAST LUMPECTOMY  2001  . CATARACT EXTRACTION Bilateral 2003   Lens implant Dr. Charise Killian  . CHOLECYSTECTOMY  1965  . COLONOSCOPY  09/11/2011   Procedure: COLONOSCOPY;  Surgeon: Dorothyann Peng, MD;  Location[facility];  Service: Endoscopy;  Laterality: N/A;  . COLONOSCOPY  2005 /2012   Dr. Lucianne Muss- diverticulosis, ext hemorrhoids.  . COLONOSCOPY WITH PROPOFOL N/A 11/09/2017   Procedure: COLONOSCOPY WITH PROPOFOL;  Surgeon: Mauri Pole, MD;  Location: [facility];  Service: Endoscopy;  Laterality: N/A;  . COLONOSCOPY WITH PROPOFOL N/A 11/12/2017   Procedure: COLONOSCOPY WITH PROPOFOL;  Surgeon: Ladene Artist, MD;  Location: [facility];  Service: Endoscopy;  Laterality: N/A;  . CYSTOSCOPY  2007  . ESOPHAGOGASTRODUODENOSCOPY  09/11/2011   Procedure: ESOPHAGOGASTRODUODENOSCOPY (EGD);  Surgeon: Dorothyann Peng, MD;  [facility];  Service: Endoscopy;  Laterality: N/A;  . ESOPHAGOGASTRODUODENOSCOPY (EGD) WITH PROPOFOL N/A 11/08/2017   Procedure: ESOPHAGOGASTRODUODENOSCOPY (EGD) WITH PROPOFOL;  Surgeon: Mauri Pole, MD;  [facility];  Service: Endoscopy;  Laterality: N/A;  . LIVER BIOPSY  2008  . LUNG BIOPSY Left 06/06/2010  . REVISION TOTAL HIP ARTHROPLASTY  2007   Left  . SKIN CANCER EXCISION     nose and left lower cheek  . TONSILLECTOMY  1930  . TOTAL HIP ARTHROPLASTY Bilateral 2005 & 2007    Dr. Earl Lagos. Aline Brochure     reports that she has never smoked. She has never used smokeless tobacco. She reports that she does not drink alcohol or use drugs. Social History   Socioeconomic History  . Marital status: Widowed    Spouse name: Not on file  . Number of children: 2  . Years of education: college   . Highest education level: Not on file  Occupational History  . Occupation: retired Tax Comptroller: RETIRED  Social Needs  . Financial resource  strain: Not hard at all  . Food insecurity:    Worry: Never true    Inability: Never true  . Transportation needs:    Medical: No    Non-medical: No  Tobacco Use  . Smoking status: Never Smoker  . Smokeless tobacco: Never Used  . Tobacco comment: 2nd hand-husband  Substance and Sexual Activity  . Alcohol use: No  . Drug use: No  . Sexual activity: Not Currently  Lifestyle  . Physical activity:    Days per week: 7 days    Minutes per session: 30 min  . Stress: Not at all  Relationships  . Social connections:    Talks on phone: More than three times a week    Gets together: More than three times a  week    Attends religious service: Never    Active member of club or organization: No    Attends meetings of clubs or organizations: Never    Relationship status: Widowed  . Intimate partner violence:    Fear of current or ex partner: No    Emotionally abused: No    Physically abused: No    Forced sexual activity: No  Other Topics Concern  . Not on file  Social History Narrative   Lives at [facility] 02/2013   2 adopted children   Was in [place], [place]   Widowed 2013   Walks with walker   Exercise: none    POA - Living Will    Functional Status Survey:    Family History  Problem Relation Age of Onset  . Heart failure Mother   . Osteoporosis Mother   . Hypertension Father   . Stroke Father   . Heart failure Father   . Leukemia Brother   . Heart failure Brother   . Cancer Maternal Grandmother   . Stroke Maternal Grandfather   . Cancer Unknown     Health Maintenance  Topic Date Due  . FOOT EXAM  07/19/1931  . OPHTHALMOLOGY EXAM  07/19/1931  . URINE MICROALBUMIN  04/30/2008  . HEMOGLOBIN A1C  10/17/2017  . TETANUS/TDAP  10/13/2029 (Originally 12/18/2015)  . INFLUENZA VACCINE  Completed  . DEXA SCAN  Completed  . PNA vac Low Risk Adult  Completed    Allergies  Allergen Reactions  . Augmentin [Amoxicillin-Pot Clavulanate] Other (See Comments)     unknown  . Ibuprofen Hives  . Naproxen Other (See Comments)    unknown  . Statins Other (See Comments)    Feels like knives in stomach  . Surgilube [Gyne-Moistrin]     Rectal itching, burning  . Tape Rash    Outpatient Encounter Medications as of 10/20/2018  Medication Sig  . acetaminophen (TYLENOL) 500 MG tablet Take 1,000 mg by mouth 2 (two) times daily.  Marland Kitchen acetaminophen (TYLENOL) 500 MG tablet Take 500 mg by mouth daily.  Marland Kitchen atenolol (TENORMIN) 25 MG tablet Take 1 tablet (25 mg total) by mouth 2 (two) times daily.  . B Complex Vitamins (B COMPLEX-B12) TABS Take 1 tablet by mouth daily.  . cetirizine (ZYRTEC) 10 MG tablet Take 10 mg by mouth daily.  . DULoxetine (CYMBALTA) 30 MG capsule Take 30 mg by mouth daily.  . feeding supplement (BOOST / RESOURCE BREEZE) LIQD Take 237 mLs by mouth 2 (two) times daily.  . ferrous gluconate (FERGON) 240 (27 FE) MG tablet Take 240 mg by mouth daily.  . folic acid (FOLVITE) 250 MCG tablet Take 400 mcg by mouth daily.  Marland Kitchen gabapentin (NEURONTIN) 100 MG capsule Take 100 mg by mouth 2 (two) times daily.  Marland Kitchen ipratropium-albuterol (DUONEB) 0.5-2.5 (3) MG/3ML SOLN Take 3 mLs by nebulization 2 (two) times daily. chronic respiratory failure  . latanoprost (XALATAN) 0.005 % ophthalmic solution Place 1 drop into both eyes at bedtime.  . lidocaine (LIDODERM) 5 % Place 1 patch onto the skin daily as needed. Remove & Discard patch within 12 hours or as directed by MD   . Menthol, Topical Analgesic, (BIOFREEZE) 4 % GEL Apply 1 application topically every 8 (eight) hours. Apply to both knees  . Multiple Vitamins-Minerals (PRESERVISION AREDS 2 PO) Take 1 capsule by mouth 2 (two) times daily.   Marland Kitchen omeprazole (PRILOSEC) 20 MG capsule TAKE 1 CAPSULE EVERY DAY.  Marland Kitchen OXYGEN Inhale  1 L into the lungs continuous.  . polyethylene glycol (MIRALAX / GLYCOLAX) packet Take 17 g by mouth daily as needed.   . saccharomyces boulardii (FLORASTOR) 250 MG capsule Take 250 mg by mouth daily  as needed. Take same day as any antibiotic  . Simethicone 80 MG TABS Take 1 tablet by mouth 4 (four) times daily as needed.  . sodium chloride 1 g tablet Take 1 g by mouth daily.  . [DISCONTINUED] zinc oxide 20 % ointment Apply 1 application topically 3 (three) times daily as needed for irritation. Apply buttocks after every incontinent episode and as needed. May keep at bedside.   No facility-administered encounter medications on file as of 10/20/2018.     Review of Systems  Constitutional: Positive for unexpected weight change.  HENT: Negative.   Respiratory: Negative.   Cardiovascular: Negative.   Neurological: Positive for weakness.  Psychiatric/Behavioral: Negative.     Vitals:   10/20/18 1251  BP: 136/82  Pulse: 62  Resp: 18  Temp: 98.1 F (36.7 C)  SpO2: 92%  Weight: 137 lb 3.2 oz (62.2 kg)  Height: 5' 4"  (1.626 m)   Body mass index is 23.55 kg/m. Physical Exam  Constitutional: She is oriented to person, place, and time. She appears well-developed and well-nourished.  HENT:  Head: Normocephalic.  Mouth/Throat: Oropharynx is clear and moist.  Eyes: Pupils are equal, round, and reactive to light.  Neck: Normal range of motion. Neck supple.  Cardiovascular: Normal rate, regular rhythm and normal heart sounds.  Pulmonary/Chest: Effort normal and breath sounds normal.  Abdominal: Soft. Bowel sounds are normal.  Musculoskeletal:  Ambulates via motorized wheelchair and wheelchair  Neurological: She is alert and oriented to person, place, and time.  Skin: Skin is warm and dry.  Nursing note and vitals reviewed.   Labs reviewed: Basic Metabolic Panel: Recent Labs    08/09/18 1519 08/10/18 0053 08/12/18 0318  08/19/18 08/21/18 08/25/18 09/25/18  NA 140 138 133*   < > 128 125 133 140  K 5.1 4.1 3.6   < > 3.5 3.4 3.8 3.3*  CL 102 97* 93*  --   --   --   --   --   CO2 29 26 28   --   --   --   --   --   GLUCOSE 101* 87 78  --   --   --   --   --   BUN 17 17 23   --   21 25* 20 18  CREATININE 0.92 0.99 0.89  --  0.82 0.80 0.86 0.7  CALCIUM 9.7 9.8 8.9  --  8.8 8.3 8.7  --    < > = values in this interval not displayed.   Liver Function Tests: Recent Labs    08/19/18 08/21/18 08/25/18 09/25/18  AST 58 45 32 18  ALT 58 47 29 11  ALKPHOS 122 134 122 139*  BILITOT 0.8 0.6 0.4  --   PROT 6.4 6.2 6.1  --   ALBUMIN 2.8 2.7 2.6  --    No results for input(s): LIPASE, AMYLASE in the last 8760 hours. Recent Labs    08/10/18 0053  AMMONIA 25   CBC: Recent Labs    01/16/18 0842  08/09/18 1519 08/19/18 09/25/18 10/09/18  WBC 10.3   < > 7.1 9.8 3.1 6.0  NEUTROABS  --   --  5.8  --   --   --   HGB 11.0*   < > 12.2  11.6 9.9* 10.1*  HCT 32.7*   < > 36.5 35.3 29* 30*  MCV 88.4  --  92.9 93.6  --   --   PLT 278   < > 297  --  211 263   < > = values in this interval not displayed.   Cardiac Enzymes: No results for input(s): CKTOTAL, CKMB, CKMBINDEX, TROPONINI in the last 8760 hours. BNP: Invalid input(s): POCBNP Lab Results  Component Value Date   HGBA1C 5.3 04/16/2017   Lab Results  Component Value Date   TSH 2.321 08/10/2018   Lab Results  Component Value Date   VITAMINB12 546 09/10/2011   Lab Results  Component Value Date   FOLATE >20.0 09/10/2011   Lab Results  Component Value Date   IRON 36 (L) 12/24/2017   TIBC 248 (L) 09/10/2011   FERRITIN 251 12/24/2017    Imaging and Procedures obtained prior to SNF admission: Ct Head Wo Contrast  Result Date: 08/09/2018 CLINICAL DATA:  Confusion. EXAM: CT HEAD WITHOUT CONTRAST TECHNIQUE: Contiguous axial images were obtained from the base of the skull through the vertex without intravenous contrast. COMPARISON:  None. FINDINGS: Brain: No subdural, epidural, or subarachnoid hemorrhage. Cerebellum, brainstem, and basal cisterns are normal. No mass effect or midline shift. Moderate white matter changes are noted. No acute cortical ischemia or infarct. No mass effect or midline shift. Ventricles  and sulci are prominent but otherwise unremarkable. Vascular: No hyperdense vessel or unexpected calcification. Skull: Normal. Negative for fracture or focal lesion. Sinuses/Orbits: No acute finding. Other: None. IMPRESSION: Chronic white matter changes.  No acute abnormalities noted. Electronically Signed   By: Dorise Bullion III M.D   On: 08/09/2018 16:54   Mr Brain Wo Contrast  Result Date: 08/09/2018 CLINICAL DATA:  Altered mental status. Possible aphasia. Recent pneumonia. Decreased responsiveness. The examination had to be discontinued prior to completion due to patient noncompliance. EXAM: MRI HEAD WITHOUT CONTRAST TECHNIQUE: Multiplanar, multiecho pulse sequences of the brain and surrounding structures were obtained without intravenous contrast. COMPARISON:  CT head 08/09/2018.  MRI brain 07/07/2007 FINDINGS: Brain: The diffusion-weighted images demonstrate no acute or subacute infarction. Vascular: Not evaluated without T2 weighted imaging. Skull and upper cervical spine: Craniocervical junction is grossly normal sagittal T1 weighted images are severely degraded by patient motion. Sinuses/Orbits: Paranasal sinuses and mastoid air cells are within normal limits on the sagittal T1 weighted imaging. IMPRESSION: 1. Diffusion-weighted imaging demonstrates no acute or subacute infarction. 2. Motion degraded sagittal T1 weighted imaging was the only other sequence performed without any gross abnormality. Electronically Signed   By: San Morelle M.D.   On: 08/09/2018 21:02   Dg Chest Port 1 View  Result Date: 08/09/2018 CLINICAL DATA:  Weakness with disorientation and fall. History of lung and breast cancer. EXAM: PORTABLE CHEST 1 VIEW COMPARISON:  01/16/2018 FINDINGS: Increased or new densities at the left lung base are most compatible with pleural fluid with consolidation. Haziness in the right hilar region may represent vascular congestion. Cardiac silhouette appears to be enlargement but  obscured by the left basilar chest disease. IMPRESSION: Left basilar chest densities. Findings are suggestive for a left pleural effusion with compressive atelectasis or consolidation. Left pleural effusion is probably small to moderate in size. Central vascular congestion. Electronically Signed   By: Markus Daft M.D.   On: 08/09/2018 15:20    Assessment/Plan 1. Essential hypertension Patient is not on blood pressure medicine.  Pressure today is 136/82  2. Pulmonary hypertension, primary [facility]) Patient may  not be aware of this diagnosis.  When asked questions he is on nebulizer treatments with Atrovent and albuterol she was not sure but I suspect pulmonary hypertension necessitates those drugs  3. Acute encephalopathy No intracranial pathology.  Allegedly encephalopathy due to hyponatremia.  Patient is back to baseline now alert and  4. Yeast vaginitis Has been treated with Diflucan without results.  She did take several courses of antibiotics but with a vaginal discharge and no itching and thought likely to be yeast.  Offered to let her to a self collected specimen for analysis but she chose to be treated empirically so we will begin metronidazole 250 mg twice daily for 5 days  5. Chronic left-sided low back pain with sciatica, sciatica laterality unspecified Patient denies back pain.  Had previously been on Lidoderm patch but not currently using   Family/ staff Communication: Findings communicated to patient and nursing staff  Labs/tests ordered: bmp  Lillette Boxer. Sabra Heck, [place] [address] [place], [place] Office [phone]

## 2018-10-21 DIAGNOSIS — N760 Acute vaginitis: Secondary | ICD-10-CM | POA: Diagnosis not present

## 2018-10-21 DIAGNOSIS — N3946 Mixed incontinence: Secondary | ICD-10-CM | POA: Diagnosis not present

## 2018-10-21 DIAGNOSIS — G9340 Encephalopathy, unspecified: Secondary | ICD-10-CM | POA: Diagnosis not present

## 2018-10-21 DIAGNOSIS — R2681 Unsteadiness on feet: Secondary | ICD-10-CM | POA: Diagnosis not present

## 2018-10-21 DIAGNOSIS — M6281 Muscle weakness (generalized): Secondary | ICD-10-CM | POA: Diagnosis not present

## 2018-10-21 DIAGNOSIS — I110 Hypertensive heart disease with heart failure: Secondary | ICD-10-CM | POA: Diagnosis not present

## 2018-10-21 LAB — BASIC METABOLIC PANEL
BUN: 19 (ref 4–21)
CREATININE: 0.8 (ref <=1.1)
Glucose: 75
POTASSIUM: 3.4 (ref 3.4–5.3)
SODIUM: 138 (ref 137–147)

## 2018-10-21 NOTE — Addendum Note (Signed)
Addended by: Wardell Honour on: 10/21/2018 01:56 PM   Modules accepted: Level of Service

## 2018-10-22 ENCOUNTER — Other Ambulatory Visit: Payer: Self-pay | Admitting: *Deleted

## 2018-10-22 LAB — BASIC METABOLIC PANEL
CHLORIDE: 101
Calcium: 8.9
Carbon Dioxide, Total: 29
GFR CALC NON AF AMER: 65

## 2018-10-23 DIAGNOSIS — G9340 Encephalopathy, unspecified: Secondary | ICD-10-CM | POA: Diagnosis not present

## 2018-10-23 DIAGNOSIS — R2681 Unsteadiness on feet: Secondary | ICD-10-CM | POA: Diagnosis not present

## 2018-10-23 DIAGNOSIS — M6281 Muscle weakness (generalized): Secondary | ICD-10-CM | POA: Diagnosis not present

## 2018-10-23 DIAGNOSIS — N3946 Mixed incontinence: Secondary | ICD-10-CM | POA: Diagnosis not present

## 2018-10-23 DIAGNOSIS — E876 Hypokalemia: Secondary | ICD-10-CM | POA: Diagnosis not present

## 2018-10-23 DIAGNOSIS — I110 Hypertensive heart disease with heart failure: Secondary | ICD-10-CM | POA: Diagnosis not present

## 2018-10-23 LAB — BASIC METABOLIC PANEL
BUN: 17 (ref 4–21)
CREATININE: 0.8 (ref <=1.1)
Glucose: 87
POTASSIUM: 3.7 (ref 3.4–5.3)
SODIUM: 138 (ref 137–147)

## 2018-10-24 ENCOUNTER — Other Ambulatory Visit: Payer: Self-pay | Admitting: *Deleted

## 2018-10-24 DIAGNOSIS — N3946 Mixed incontinence: Secondary | ICD-10-CM | POA: Diagnosis not present

## 2018-10-24 DIAGNOSIS — I110 Hypertensive heart disease with heart failure: Secondary | ICD-10-CM | POA: Diagnosis not present

## 2018-10-24 DIAGNOSIS — M6281 Muscle weakness (generalized): Secondary | ICD-10-CM | POA: Diagnosis not present

## 2018-10-24 DIAGNOSIS — G9340 Encephalopathy, unspecified: Secondary | ICD-10-CM | POA: Diagnosis not present

## 2018-10-24 DIAGNOSIS — R2681 Unsteadiness on feet: Secondary | ICD-10-CM | POA: Diagnosis not present

## 2018-10-24 LAB — BASIC METABOLIC PANEL
CHLORIDE: 101
CO2: 28
Calcium: 9.1
GFR CALC NON AF AMER: 64

## 2018-10-27 DIAGNOSIS — N3946 Mixed incontinence: Secondary | ICD-10-CM | POA: Diagnosis not present

## 2018-10-27 DIAGNOSIS — M6281 Muscle weakness (generalized): Secondary | ICD-10-CM | POA: Diagnosis not present

## 2018-10-27 DIAGNOSIS — G9340 Encephalopathy, unspecified: Secondary | ICD-10-CM | POA: Diagnosis not present

## 2018-10-27 DIAGNOSIS — R2681 Unsteadiness on feet: Secondary | ICD-10-CM | POA: Diagnosis not present

## 2018-10-27 DIAGNOSIS — I110 Hypertensive heart disease with heart failure: Secondary | ICD-10-CM | POA: Diagnosis not present

## 2018-10-28 DIAGNOSIS — G9340 Encephalopathy, unspecified: Secondary | ICD-10-CM | POA: Diagnosis not present

## 2018-10-28 DIAGNOSIS — M6281 Muscle weakness (generalized): Secondary | ICD-10-CM | POA: Diagnosis not present

## 2018-10-28 DIAGNOSIS — R2681 Unsteadiness on feet: Secondary | ICD-10-CM | POA: Diagnosis not present

## 2018-10-28 DIAGNOSIS — N3946 Mixed incontinence: Secondary | ICD-10-CM | POA: Diagnosis not present

## 2018-10-28 DIAGNOSIS — I110 Hypertensive heart disease with heart failure: Secondary | ICD-10-CM | POA: Diagnosis not present

## 2018-10-29 DIAGNOSIS — I110 Hypertensive heart disease with heart failure: Secondary | ICD-10-CM | POA: Diagnosis not present

## 2018-10-29 DIAGNOSIS — N3946 Mixed incontinence: Secondary | ICD-10-CM | POA: Diagnosis not present

## 2018-10-29 DIAGNOSIS — R2681 Unsteadiness on feet: Secondary | ICD-10-CM | POA: Diagnosis not present

## 2018-10-29 DIAGNOSIS — M6281 Muscle weakness (generalized): Secondary | ICD-10-CM | POA: Diagnosis not present

## 2018-10-29 DIAGNOSIS — G9340 Encephalopathy, unspecified: Secondary | ICD-10-CM | POA: Diagnosis not present

## 2018-10-30 DIAGNOSIS — N3946 Mixed incontinence: Secondary | ICD-10-CM | POA: Diagnosis not present

## 2018-10-30 DIAGNOSIS — M6281 Muscle weakness (generalized): Secondary | ICD-10-CM | POA: Diagnosis not present

## 2018-10-30 DIAGNOSIS — I110 Hypertensive heart disease with heart failure: Secondary | ICD-10-CM | POA: Diagnosis not present

## 2018-10-30 DIAGNOSIS — G9340 Encephalopathy, unspecified: Secondary | ICD-10-CM | POA: Diagnosis not present

## 2018-10-30 DIAGNOSIS — R2681 Unsteadiness on feet: Secondary | ICD-10-CM | POA: Diagnosis not present

## 2018-10-31 DIAGNOSIS — N3946 Mixed incontinence: Secondary | ICD-10-CM | POA: Diagnosis not present

## 2018-10-31 DIAGNOSIS — G9340 Encephalopathy, unspecified: Secondary | ICD-10-CM | POA: Diagnosis not present

## 2018-10-31 DIAGNOSIS — R2681 Unsteadiness on feet: Secondary | ICD-10-CM | POA: Diagnosis not present

## 2018-10-31 DIAGNOSIS — M6281 Muscle weakness (generalized): Secondary | ICD-10-CM | POA: Diagnosis not present

## 2018-10-31 DIAGNOSIS — I110 Hypertensive heart disease with heart failure: Secondary | ICD-10-CM | POA: Diagnosis not present

## 2018-11-04 DIAGNOSIS — G9340 Encephalopathy, unspecified: Secondary | ICD-10-CM | POA: Diagnosis not present

## 2018-11-04 DIAGNOSIS — I110 Hypertensive heart disease with heart failure: Secondary | ICD-10-CM | POA: Diagnosis not present

## 2018-11-04 DIAGNOSIS — R2681 Unsteadiness on feet: Secondary | ICD-10-CM | POA: Diagnosis not present

## 2018-11-04 DIAGNOSIS — N3946 Mixed incontinence: Secondary | ICD-10-CM | POA: Diagnosis not present

## 2018-11-04 DIAGNOSIS — M6281 Muscle weakness (generalized): Secondary | ICD-10-CM | POA: Diagnosis not present

## 2018-11-05 DIAGNOSIS — R2681 Unsteadiness on feet: Secondary | ICD-10-CM | POA: Diagnosis not present

## 2018-11-05 DIAGNOSIS — M6281 Muscle weakness (generalized): Secondary | ICD-10-CM | POA: Diagnosis not present

## 2018-11-05 DIAGNOSIS — N3946 Mixed incontinence: Secondary | ICD-10-CM | POA: Diagnosis not present

## 2018-11-05 DIAGNOSIS — G9340 Encephalopathy, unspecified: Secondary | ICD-10-CM | POA: Diagnosis not present

## 2018-11-05 DIAGNOSIS — I110 Hypertensive heart disease with heart failure: Secondary | ICD-10-CM | POA: Diagnosis not present

## 2018-11-07 DIAGNOSIS — R2681 Unsteadiness on feet: Secondary | ICD-10-CM | POA: Diagnosis not present

## 2018-11-07 DIAGNOSIS — I110 Hypertensive heart disease with heart failure: Secondary | ICD-10-CM | POA: Diagnosis not present

## 2018-11-07 DIAGNOSIS — M6281 Muscle weakness (generalized): Secondary | ICD-10-CM | POA: Diagnosis not present

## 2018-11-07 DIAGNOSIS — G9340 Encephalopathy, unspecified: Secondary | ICD-10-CM | POA: Diagnosis not present

## 2018-11-07 DIAGNOSIS — N3946 Mixed incontinence: Secondary | ICD-10-CM | POA: Diagnosis not present

## 2018-11-09 DIAGNOSIS — R2681 Unsteadiness on feet: Secondary | ICD-10-CM | POA: Diagnosis not present

## 2018-11-09 DIAGNOSIS — G9340 Encephalopathy, unspecified: Secondary | ICD-10-CM | POA: Diagnosis not present

## 2018-11-09 DIAGNOSIS — M6281 Muscle weakness (generalized): Secondary | ICD-10-CM | POA: Diagnosis not present

## 2018-11-09 DIAGNOSIS — I110 Hypertensive heart disease with heart failure: Secondary | ICD-10-CM | POA: Diagnosis not present

## 2018-11-09 DIAGNOSIS — N3946 Mixed incontinence: Secondary | ICD-10-CM | POA: Diagnosis not present

## 2018-11-11 DIAGNOSIS — R2681 Unsteadiness on feet: Secondary | ICD-10-CM | POA: Diagnosis not present

## 2018-11-11 DIAGNOSIS — N3946 Mixed incontinence: Secondary | ICD-10-CM | POA: Diagnosis not present

## 2018-11-11 DIAGNOSIS — I110 Hypertensive heart disease with heart failure: Secondary | ICD-10-CM | POA: Diagnosis not present

## 2018-11-11 DIAGNOSIS — G9340 Encephalopathy, unspecified: Secondary | ICD-10-CM | POA: Diagnosis not present

## 2018-11-11 DIAGNOSIS — M6281 Muscle weakness (generalized): Secondary | ICD-10-CM | POA: Diagnosis not present

## 2018-11-12 DIAGNOSIS — R2681 Unsteadiness on feet: Secondary | ICD-10-CM | POA: Diagnosis not present

## 2018-11-12 DIAGNOSIS — N3946 Mixed incontinence: Secondary | ICD-10-CM | POA: Diagnosis not present

## 2018-11-12 DIAGNOSIS — G9340 Encephalopathy, unspecified: Secondary | ICD-10-CM | POA: Diagnosis not present

## 2018-11-12 DIAGNOSIS — I110 Hypertensive heart disease with heart failure: Secondary | ICD-10-CM | POA: Diagnosis not present

## 2018-11-12 DIAGNOSIS — M6281 Muscle weakness (generalized): Secondary | ICD-10-CM | POA: Diagnosis not present

## 2018-11-19 DIAGNOSIS — Z85828 Personal history of other malignant neoplasm of skin: Secondary | ICD-10-CM | POA: Diagnosis not present

## 2018-11-19 DIAGNOSIS — L814 Other melanin hyperpigmentation: Secondary | ICD-10-CM | POA: Diagnosis not present

## 2018-11-19 DIAGNOSIS — L821 Other seborrheic keratosis: Secondary | ICD-10-CM | POA: Diagnosis not present

## 2018-11-19 DIAGNOSIS — L570 Actinic keratosis: Secondary | ICD-10-CM | POA: Diagnosis not present

## 2018-11-19 DIAGNOSIS — D1801 Hemangioma of skin and subcutaneous tissue: Secondary | ICD-10-CM | POA: Diagnosis not present

## 2018-12-23 DIAGNOSIS — I270 Primary pulmonary hypertension: Secondary | ICD-10-CM | POA: Diagnosis not present

## 2018-12-23 LAB — CBC AND DIFFERENTIAL
HCT: 33 — AB (ref 36–46)
HEMOGLOBIN: 11.2 — AB (ref 12.0–16.0)
PLATELETS: 177 (ref 150–399)
WBC: 5

## 2018-12-24 ENCOUNTER — Other Ambulatory Visit: Payer: Self-pay | Admitting: *Deleted

## 2018-12-26 ENCOUNTER — Telehealth: Payer: Self-pay

## 2018-12-26 DIAGNOSIS — Z09 Encounter for follow-up examination after completed treatment for conditions other than malignant neoplasm: Secondary | ICD-10-CM | POA: Diagnosis not present

## 2018-12-26 NOTE — Telephone Encounter (Signed)
Representative from [place] called stating patient was in office to be seen today and they would like for her to follow-up with PCP in 1 week.   I informed representative that patient is an assisted living patient at [facility] and I will send Manxie Mast, NP and Sharon/RMA a message informing them of this interaction.   Ivin Booty please place patient on list to see [facility] or Dr.Gupta next week

## 2019-01-01 NOTE — Telephone Encounter (Signed)
[name], please acknowledge that you have see this message and when you all plan to see patient.   Thanks

## 2019-01-01 NOTE — Telephone Encounter (Signed)
[name] is having the nurse in the [facility] unit check with the doctor to see why are we seeing the patient and what should we be looking for because no note was given to the patient.

## 2019-01-07 DIAGNOSIS — N952 Postmenopausal atrophic vaginitis: Secondary | ICD-10-CM | POA: Diagnosis not present

## 2019-01-07 DIAGNOSIS — N898 Other specified noninflammatory disorders of vagina: Secondary | ICD-10-CM | POA: Diagnosis not present

## 2019-01-14 ENCOUNTER — Encounter: Payer: Self-pay | Admitting: Nurse Practitioner

## 2019-01-14 DIAGNOSIS — H409 Unspecified glaucoma: Secondary | ICD-10-CM | POA: Insufficient documentation

## 2019-01-14 DIAGNOSIS — N898 Other specified noninflammatory disorders of vagina: Secondary | ICD-10-CM | POA: Insufficient documentation

## 2019-01-14 DIAGNOSIS — H3530 Unspecified macular degeneration: Secondary | ICD-10-CM | POA: Insufficient documentation

## 2019-01-15 ENCOUNTER — Non-Acute Institutional Stay: Payer: Medicare HMO | Admitting: Nurse Practitioner

## 2019-01-15 DIAGNOSIS — I10 Essential (primary) hypertension: Secondary | ICD-10-CM

## 2019-01-15 DIAGNOSIS — G453 Amaurosis fugax: Secondary | ICD-10-CM | POA: Insufficient documentation

## 2019-01-15 DIAGNOSIS — H53129 Transient visual loss, unspecified eye: Secondary | ICD-10-CM | POA: Diagnosis not present

## 2019-01-15 NOTE — Progress Notes (Signed)
Location:  [place] Room Number: 916 Place of Service:  ALF [phone]) Provider:  Keelyn Fjelstad, Lennie Odor  NP  Virgie Dad, MD  Patient Care Team: Virgie Dad, MD as PCP - General (Internal Medicine) Lanelle Bal., MD as Referring Physician (Internal Medicine) Satira Sark, MD as Consulting Physician (Cardiology) Kyung Rudd, MD as Consulting Physician (Radiation Oncology) Xaviar Lunn X, NP as Nurse Practitioner (Internal Medicine)  Extended Emergency Contact Information Primary Emergency Contact: Neita Garnet of [place] Phone: [phone] Relation: Son Secondary Emergency Contact: Manzo,Lance Address: [address]          [place], [place] [address] [name] of [place] Phone: [phone] Mobile Phone: [phone] Relation: Son  Code Status:  DNR Goals of care: Advanced Directive information Advanced Directives 01/15/2019  Does Patient Have a Medical Advance Directive? Yes  Type of Advance Directive Living will;Out of facility DNR (pink MOST or yellow form)  Does patient want to make changes to medical advance directive? No - Patient declined  Copy of [place] in Chart? -  Would patient like information on creating a medical advance directive? -  Pre-existing out of facility DNR order (yellow form or pink MOST form) Pink MOST form placed in chart (order not valid for inpatient use)     Chief Complaint  Patient presents with  . Acute Visit    C/o- vision problems    HPI:  Pt is a 85 y.o. female seen today for an acute visit for the episode of right eye vision loss for a few hours. 12/26/18 [facility]: nonexudative age related macular degeneration, bilateral, early dry stage. The patient was seen for 12/06/18 blurry vision in the right eye, then completely black, came back about 3-4hours later.   The patient denied headache, dizziness, or focal weakness. She wants to get carotid artery  stenosis checked.   Hx of HTN, blood pressure is controlled on Metoprolol 59m bid.    Past Medical History:  Diagnosis Date  . Allergic rhinitis   . Anemia    NOS  . Anxiety   . Biceps tendon rupture    Bilateral  . Breast cancer ([facility] [place]   Left s/p lumpectomy and XRT   . Bronchiectasis ([facility] 2014   Noted on chest x-ray  . Complication of anesthesia    slow to wake up  . Cough 2014  . Depression   . Diverticulosis   . Elevated liver enzymes    Biopsy consistent with steatohepatitis  . Fundic gland polyps of stomach, benign   . GERD (gastroesophageal reflux disease) 06/2007   EGD Dr RGala Romney>sm HH, multiple fundic gland polyps  . Glaucoma 2013   both eyes  . Hemorrhoids   . Hiatal hernia   . Hx of radiation therapy 07/17/10 to 07/21/10   SRS LLL lung  . Hyperlipidemia   . Insomnia   . Low back pain    scoliosis  . Lung cancer ([facility] 05/2010   Left 2011 - rad x 3  . Lymphedema    Left arm  . Osteoarthritis   . Osteopenia 10/25/2016  . Osteoporosis, senile   . Overactive bladder   . Pneumonia    RLL with sepsis  . PSVT (paroxysmal supraventricular tachycardia) ([facility]   . Rheumatic fever    Age 85 . Right thyroid nodule   . Steatohepatitis    liver bx  . Type 2 diabetes mellitus ([facility]  Past Surgical History:  Procedure Laterality Date  . ABDOMINAL HYSTERECTOMY  1964  . APPENDECTOMY  1964  . BIOPSY THYROID  11/2008  . BREAST BIOPSY     X 3 w/ cystectomy  . BREAST LUMPECTOMY  2001  . CATARACT EXTRACTION Bilateral 2003   Lens implant Dr. Charise Killian  . CHOLECYSTECTOMY  1965  . COLONOSCOPY  09/11/2011   Procedure: COLONOSCOPY;  Surgeon: Dorothyann Peng, MD;  [facility];  Service: Endoscopy;  Laterality: N/A;  . COLONOSCOPY  2005 /2012   Dr. Lucianne Muss- diverticulosis, ext hemorrhoids.  . COLONOSCOPY WITH PROPOFOL N/A 11/09/2017   Procedure: COLONOSCOPY WITH PROPOFOL;  Surgeon: Mauri Pole, MD;  Location: [facility];  Service: Endoscopy;  Laterality:  N/A;  . COLONOSCOPY WITH PROPOFOL N/A 11/12/2017   Procedure: COLONOSCOPY WITH PROPOFOL;  Surgeon: Ladene Artist, MD;  Location: [facility];  Service: Endoscopy;  Laterality: N/A;  . CYSTOSCOPY  2007  . ESOPHAGOGASTRODUODENOSCOPY  09/11/2011   Procedure: ESOPHAGOGASTRODUODENOSCOPY (EGD);  Surgeon: Dorothyann Peng, MD;  [facility];  Service: Endoscopy;  Laterality: N/A;  . ESOPHAGOGASTRODUODENOSCOPY (EGD) WITH PROPOFOL N/A 11/08/2017   Procedure: ESOPHAGOGASTRODUODENOSCOPY (EGD) WITH PROPOFOL;  Surgeon: Mauri Pole, MD;  Location: [facility];  Service: Endoscopy;  Laterality: N/A;  . LIVER BIOPSY  2008  . LUNG BIOPSY Left 06/06/2010  . REVISION TOTAL HIP ARTHROPLASTY  2007   Left  . SKIN CANCER EXCISION     nose and left lower cheek  . TONSILLECTOMY  1930  . TOTAL HIP ARTHROPLASTY Bilateral 2005 & 2007    Dr. Earl Lagos. Aline Brochure     Allergies  Allergen Reactions  . Augmentin [Amoxicillin-Pot Clavulanate] Other (See Comments)    unknown  . Ibuprofen Hives  . Naproxen Other (See Comments)    unknown  . Statins Other (See Comments)    Feels like knives in stomach  . Surgilube [Gyne-Moistrin]     Rectal itching, burning  . Tape Rash    Outpatient Encounter Medications as of 01/15/2019  Medication Sig  . acetaminophen (TYLENOL) 500 MG tablet Take 1,000 mg by mouth 2 (two) times daily.  Marland Kitchen acetaminophen (TYLENOL) 500 MG tablet Take 500 mg by mouth daily.  Marland Kitchen atenolol (TENORMIN) 25 MG tablet Take 1 tablet (25 mg total) by mouth 2 (two) times daily.  . B Complex Vitamins (B COMPLEX-B12) TABS Take 1 tablet by mouth daily.  . DULoxetine (CYMBALTA) 30 MG capsule Take 30 mg by mouth daily.  . feeding supplement (BOOST / RESOURCE BREEZE) LIQD Take 237 mLs by mouth 2 (two) times daily.  . ferrous gluconate (FERGON) 240 (27 FE) MG tablet Take 240 mg by mouth daily.  . folic acid (FOLVITE) 979 MCG tablet Take 400 mcg by mouth daily.  Marland Kitchen gabapentin (NEURONTIN) 100 MG  capsule Take 100 mg by mouth 2 (two) times daily.  Marland Kitchen ipratropium-albuterol (DUONEB) 0.5-2.5 (3) MG/3ML SOLN Take 3 mLs by nebulization 2 (two) times daily. chronic respiratory failure  . latanoprost (XALATAN) 0.005 % ophthalmic solution Place 1 drop into both eyes at bedtime.  . lidocaine (LIDODERM) 5 % Place 1 patch onto the skin daily as needed. Remove & Discard patch within 12 hours or as directed by MD   . Menthol, Topical Analgesic, (BIOFREEZE) 4 % GEL Apply 1 application topically every 8 (eight) hours. Apply to both knees  . Multiple Vitamins-Minerals (PRESERVISION AREDS 2 PO) Take 1 capsule by mouth 2 (two) times daily.   Marland Kitchen omeprazole (PRILOSEC) 20  MG capsule TAKE 1 CAPSULE EVERY DAY.  Marland Kitchen polyethylene glycol (MIRALAX / GLYCOLAX) packet Take 17 g by mouth daily as needed.   . Simethicone 80 MG TABS Take 1 tablet by mouth 4 (four) times daily as needed.  . sodium chloride 1 g tablet Take 1 g by mouth daily.  . Sodium Fluoride (PREVIDENT 5000 BOOSTER PLUS) 1.1 % PSTE Place onto teeth at bedtime.  . [DISCONTINUED] cetirizine (ZYRTEC) 10 MG tablet Take 10 mg by mouth daily.  . [DISCONTINUED] OXYGEN Inhale 1 L into the lungs continuous.  . [DISCONTINUED] saccharomyces boulardii (FLORASTOR) 250 MG capsule Take 250 mg by mouth daily as needed. Take same day as any antibiotic   No facility-administered encounter medications on file as of 01/15/2019.     Review of Systems  Constitutional: Negative for activity change, appetite change, chills, diaphoresis, fatigue and fever.  HENT: Positive for hearing loss. Negative for congestion, drooling, trouble swallowing and voice change.   Eyes: Positive for visual disturbance.       Left upper visual field loss. Macular degeneration. Glaucoma.   Respiratory: Negative for cough, shortness of breath and wheezing.   Cardiovascular: Negative for chest pain, palpitations and leg swelling.  Gastrointestinal: Negative for abdominal distention, abdominal pain,  constipation, diarrhea, nausea and vomiting.  Genitourinary: Negative for difficulty urinating, dysuria and urgency.  Musculoskeletal: Positive for arthralgias and gait problem.  Skin: Negative for color change and pallor.  Neurological: Negative for dizziness, tremors, syncope, facial asymmetry, speech difficulty, weakness, light-headedness and headaches.       Memory lapses.   Psychiatric/Behavioral: Negative for agitation, hallucinations and sleep disturbance. The patient is not nervous/anxious.     Immunization History  Administered Date(s) Administered  . Influenza Whole 09/22/2008, 09/16/2012, 09/30/2013  . Influenza, High Dose Seasonal PF 09/25/2017  . Influenza,inj,Quad PF,6+ Mos 10/09/2018  . Influenza-Unspecified 10/02/2014, 09/15/2015, 09/27/2016  . Pneumococcal Conjugate-13 11/29/2017  . Pneumococcal Polysaccharide-23 08/17/2006  . Td 12/17/2005   Pertinent  Health Maintenance Due  Topic Date Due  . FOOT EXAM  07/19/1931  . OPHTHALMOLOGY EXAM  07/19/1931  . URINE MICROALBUMIN  04/30/2008  . HEMOGLOBIN A1C  10/17/2017  . INFLUENZA VACCINE  Completed  . DEXA SCAN  Completed  . PNA vac Low Risk Adult  Completed   Fall Risk  10/07/2018 09/19/2017 05/17/2016 04/20/2016 04/19/2016  Falls in the past year? Yes No No No No  Number falls in past yr: 1 - - - -  Injury with Fall? No - - - -   Functional Status Survey:    Vitals:   01/15/19 1136  BP: (!) 158/70  Pulse: 64  Resp: 20  Temp: 98.7 F (37.1 C)  SpO2: 98%  Weight: 148 lb 12.8 oz (67.5 kg)  Height: 5' 4"  (1.626 m)   Body mass index is 25.54 kg/m. Physical Exam Constitutional:      General: She is not in acute distress.    Appearance: Normal appearance. She is not ill-appearing, toxic-appearing or diaphoretic.  HENT:     Head: Normocephalic and atraumatic.     Nose: Nose normal.     Mouth/Throat:     Mouth: Mucous membranes are moist.  Eyes:     General: No scleral icterus.    Pupils: Pupils are equal,  round, and reactive to light.     Comments: Left upper visual field loss  Neck:     Musculoskeletal: Normal range of motion and neck supple.  Cardiovascular:     Rate and  Rhythm: Normal rate and regular rhythm.     Heart sounds: No murmur.  Pulmonary:     Breath sounds: No wheezing, rhonchi or rales.  Abdominal:     Palpations: Abdomen is soft.     Tenderness: There is no abdominal tenderness. There is no guarding or rebound.  Musculoskeletal:     Right lower leg: No edema.     Left lower leg: No edema.     Comments: Motorized w/c for mobility  Skin:    General: Skin is warm and dry.  Neurological:     General: No focal deficit present.     Mental Status: She is alert and oriented to person, place, and time. Mental status is at baseline.     Cranial Nerves: No cranial nerve deficit.     Motor: No weakness.     Coordination: Coordination normal.     Gait: Gait abnormal.  Psychiatric:        Mood and Affect: Mood normal.        Behavior: Behavior normal.        Thought Content: Thought content normal.        Judgment: Judgment normal.     Labs reviewed: Recent Labs    08/09/18 1519 08/10/18 0053 08/12/18 0318  08/25/18 09/25/18 10/21/18 10/23/18  NA 140 138 133*   < > 133 140 138 138  K 5.1 4.1 3.6   < > 3.8 3.3* 3.4 3.7  CL 102 97* 93*  --   --   --  101 101  CO2 29 26 28   --   --   --  29 28  GLUCOSE 101* 87 78  --   --   --   --   --   BUN 17 17 23    < > 20 18 19 17   CREATININE 0.92 0.99 0.89   < > 0.86 0.7 0.8 0.8  CALCIUM 9.7 9.8 8.9   < > 8.7  --  8.9 9.1   < > = values in this interval not displayed.   Recent Labs    08/19/18 08/21/18 08/25/18 09/25/18  AST 58 45 32 18  ALT 58 47 29 11  ALKPHOS 122 134 122 139*  BILITOT 0.8 0.6 0.4  --   PROT 6.4 6.2 6.1  --   ALBUMIN 2.8 2.7 2.6  --    Recent Labs    08/09/18 1519 08/19/18 09/25/18 10/09/18 12/23/18  WBC 7.1 9.8 3.1 6.0 5.0  NEUTROABS 5.8  --   --   --   --   HGB 12.2 11.6 9.9* 10.1* 11.2*  HCT  36.5 35.3 29* 30* 33*  MCV 92.9 93.6  --   --   --   PLT 297  --  211 263 177   Lab Results  Component Value Date   TSH 2.321 08/10/2018   Lab Results  Component Value Date   HGBA1C 5.3 04/16/2017   Lab Results  Component Value Date   CHOL 188 04/12/2016   HDL 62 04/12/2016   LDLCALC 102 04/12/2016   TRIG 122 04/12/2016   CHOLHDL 3.0 Ratio 06/07/2008    Significant Diagnostic Results in last 30 days:  No results found.  Assessment/Plan Amaurosis fugax of right eye Clinically presumed, will obtain carotid artery [place] R+L. Observe the patient.   HTN (hypertension) Blood pressure is in control, continue Metoprolol 71m bid.      Family/ staff Communication: plan of care reviewed with the patient and  charge nurse.   Labs/tests ordered:  US carotid artery R+L  Time spend 25 minutes.

## 2019-01-15 NOTE — Assessment & Plan Note (Signed)
Blood pressure is in control, continue Metoprolol 57m bid.

## 2019-01-15 NOTE — Assessment & Plan Note (Signed)
Clinically presumed, will obtain carotid artery [place] R+L. Observe the patient.

## 2019-01-16 ENCOUNTER — Encounter: Payer: Self-pay | Admitting: Nurse Practitioner

## 2019-02-06 DIAGNOSIS — H401111 Primary open-angle glaucoma, right eye, mild stage: Secondary | ICD-10-CM | POA: Diagnosis not present

## 2019-02-06 DIAGNOSIS — H353131 Nonexudative age-related macular degeneration, bilateral, early dry stage: Secondary | ICD-10-CM | POA: Diagnosis not present

## 2019-02-06 DIAGNOSIS — H401122 Primary open-angle glaucoma, left eye, moderate stage: Secondary | ICD-10-CM | POA: Diagnosis not present

## 2019-02-17 ENCOUNTER — Non-Acute Institutional Stay: Payer: Medicare HMO | Admitting: Nurse Practitioner

## 2019-02-17 ENCOUNTER — Encounter: Payer: Self-pay | Admitting: Nurse Practitioner

## 2019-02-17 DIAGNOSIS — E876 Hypokalemia: Secondary | ICD-10-CM | POA: Diagnosis not present

## 2019-02-17 DIAGNOSIS — K219 Gastro-esophageal reflux disease without esophagitis: Secondary | ICD-10-CM | POA: Diagnosis not present

## 2019-02-17 DIAGNOSIS — R69 Illness, unspecified: Secondary | ICD-10-CM | POA: Diagnosis not present

## 2019-02-17 DIAGNOSIS — F418 Other specified anxiety disorders: Secondary | ICD-10-CM

## 2019-02-17 DIAGNOSIS — G629 Polyneuropathy, unspecified: Secondary | ICD-10-CM

## 2019-02-17 DIAGNOSIS — D500 Iron deficiency anemia secondary to blood loss (chronic): Secondary | ICD-10-CM

## 2019-02-17 DIAGNOSIS — M5440 Lumbago with sciatica, unspecified side: Secondary | ICD-10-CM | POA: Diagnosis not present

## 2019-02-17 DIAGNOSIS — I270 Primary pulmonary hypertension: Secondary | ICD-10-CM | POA: Diagnosis not present

## 2019-02-17 DIAGNOSIS — J9611 Chronic respiratory failure with hypoxia: Secondary | ICD-10-CM

## 2019-02-17 DIAGNOSIS — G8929 Other chronic pain: Secondary | ICD-10-CM | POA: Diagnosis not present

## 2019-02-17 NOTE — Progress Notes (Signed)
Location:  [place] Room Number: 916 Place of Service:  ALF [phone]) Provider:  Vernee Baines, Lennie Odor  NP  Virgie Dad, MD  Patient Care Team: Virgie Dad, MD as PCP - General (Internal Medicine) Lanelle Bal., MD as Referring Physician (Internal Medicine) Satira Sark, MD as Consulting Physician (Cardiology) Kyung Rudd, MD as Consulting Physician (Radiation Oncology) Bhavya Grand X, NP as Nurse Practitioner (Internal Medicine)  Extended Emergency Contact Information Primary Emergency Contact: Neita Garnet of [place] Phone: [phone] Relation: Son Secondary Emergency Contact: Vandevoorde,Lance Address: [address]          [place], [place] [address] [name] of [place] Phone: [phone] Mobile Phone: [phone] Relation: Son  Code Status:  Full Code Goals of care: Advanced Directive information Advanced Directives 02/17/2019  Does Patient Have a Medical Advance Directive? Yes  Type of Advance Directive Living will;Out of facility DNR (pink MOST or yellow form)  Does patient want to make changes to medical advance directive? No - Patient declined  Copy of [place] in Chart? -  Would patient like information on creating a medical advance directive? -  Pre-existing out of facility DNR order (yellow form or pink MOST form) Pink MOST form placed in chart (order not valid for inpatient use)     Chief Complaint  Patient presents with  . Medical Management of Chronic Issues    HPI:  Pt is a 85 y.o. female seen today for medical management of chronic diseases.      The patient has history of hypokalemia, currently not on K supplement, 2.6 07/24/18. 02/17/19 Na 137, K 2.4, Bun 18, creat 0.93. She denied increased weakness/fatigue, muscle spasm, bloating/constipation, palpitation, muscle aches, tingling/numbness/SOB, mood changes/altered mentation.   Hx of GERD stable on Omeprazole 64m qd. Chronic  respiratory failure with hypoxia,  stable DuoNeb bid. Peripheral neuropathy, on Gabapentin 1029mbid. Anemia, on folate 40076mqd, Fe 240m5m, Vit B complex, last Hgb 11.2 12/23/18  Her mood is stable on Duloxetine 30mg62m OA pain, stable on Tylenol 1000mg 66m 500mg q49m   Past Medical History:  Diagnosis Date  . Allergic rhinitis   . Anemia    NOS  . Anxiety   . Biceps tendon rupture    Bilateral  . Breast cancer (HCC) 20[place]  Left s/p lumpectomy and XRT   . Bronchiectasis (HCC) [place]  Noted on chest x-ray  . Complication of anesthesia    slow to wake up  . Cough 2014  . Depression   . Diverticulosis   . Elevated liver enzymes    Biopsy consistent with steatohepatitis  . Fundic gland polyps of stomach, benign   . GERD (gastroesophageal reflux disease) 06/2007   EGD Dr Rourk >Gala [place], multiple fundic gland polyps  . Glaucoma 2013   both eyes  . Hemorrhoids   . Hiatal hernia   . Hx of radiation therapy 07/17/10 to 07/21/10   SRS LLL lung  . Hyperlipidemia   . Insomnia   . Low back pain    scoliosis  . Lung cancer (HCC) 6[place]   Left 2011 - rad x 3  . Lymphedema    Left arm  . Osteoarthritis   . Osteopenia 10/25/2016  . Osteoporosis, senile   . Overactive bladder   . Pneumonia    RLL with sepsis  . PSVT (paroxysmal supraventricular tachycardia) (HCC)   [place]    Age 69  .17  Right thyroid nodule   . Steatohepatitis    liver bx  . Type 2 diabetes mellitus ([place])    Past Surgical History:  Procedure Laterality Date  . ABDOMINAL HYSTERECTOMY  1964  . APPENDECTOMY  1964  . BIOPSY THYROID  11/2008  . BREAST BIOPSY     X 3 w/ cystectomy  . BREAST LUMPECTOMY  2001  . CATARACT EXTRACTION Bilateral 2003   Lens implant Dr. Charise Killian  . CHOLECYSTECTOMY  1965  . COLONOSCOPY  09/11/2011   Procedure: COLONOSCOPY;  Surgeon: Dorothyann Peng, MD;  [facility];  Service: Endoscopy;  Laterality: N/A;  . COLONOSCOPY  2005 /2012   Dr. Lucianne Muss- diverticulosis, ext  hemorrhoids.  . COLONOSCOPY WITH PROPOFOL N/A 11/09/2017   Procedure: COLONOSCOPY WITH PROPOFOL;  Surgeon: Mauri Pole, MD;  Location: [facility];  Service: Endoscopy;  Laterality: N/A;  . COLONOSCOPY WITH PROPOFOL N/A 11/12/2017   Procedure: COLONOSCOPY WITH PROPOFOL;  Surgeon: Ladene Artist, MD;  Location: [facility];  Service: Endoscopy;  Laterality: N/A;  . CYSTOSCOPY  2007  . ESOPHAGOGASTRODUODENOSCOPY  09/11/2011   Procedure: ESOPHAGOGASTRODUODENOSCOPY (EGD);  Surgeon: Dorothyann Peng, MD;  [facility];  Service: Endoscopy;  Laterality: N/A;  . ESOPHAGOGASTRODUODENOSCOPY (EGD) WITH PROPOFOL N/A 11/08/2017   Procedure: ESOPHAGOGASTRODUODENOSCOPY (EGD) WITH PROPOFOL;  Surgeon: Mauri Pole, MD;  Location: [facility];  Service: Endoscopy;  Laterality: N/A;  . LIVER BIOPSY  2008  . LUNG BIOPSY Left 06/06/2010  . REVISION TOTAL HIP ARTHROPLASTY  2007   Left  . SKIN CANCER EXCISION     nose and left lower cheek  . TONSILLECTOMY  1930  . TOTAL HIP ARTHROPLASTY Bilateral 2005 & 2007    Dr. Earl Lagos. Aline Brochure     Allergies  Allergen Reactions  . Augmentin [Amoxicillin-Pot Clavulanate] Other (See Comments)    unknown  . Ibuprofen Hives  . Naproxen Other (See Comments)    unknown  . Statins Other (See Comments)    Feels like knives in stomach  . Surgilube [Gyne-Moistrin]     Rectal itching, burning  . Tape Rash    Outpatient Encounter Medications as of 02/17/2019  Medication Sig  . acetaminophen (TYLENOL) 500 MG tablet Take 1,000 mg by mouth 2 (two) times daily.  Marland Kitchen acetaminophen (TYLENOL) 500 MG tablet Take 500 mg by mouth daily.  Marland Kitchen atenolol (TENORMIN) 25 MG tablet Take 1 tablet (25 mg total) by mouth 2 (two) times daily.  . B Complex Vitamins (B COMPLEX-B12) TABS Take 1 tablet by mouth daily.  . dorzolamide-timolol (COSOPT) 22.3-6.8 MG/ML ophthalmic solution Place 1 drop into the left eye 2 (two) times daily.  . DULoxetine (CYMBALTA) 30 MG  capsule Take 30 mg by mouth daily.  . feeding supplement (BOOST / RESOURCE BREEZE) LIQD Take 237 mLs by mouth 2 (two) times daily.  . ferrous gluconate (FERGON) 240 (27 FE) MG tablet Take 240 mg by mouth daily.  . folic acid (FOLVITE) 846 MCG tablet Take 400 mcg by mouth daily.  Marland Kitchen gabapentin (NEURONTIN) 100 MG capsule Take 100 mg by mouth 2 (two) times daily.  Marland Kitchen ipratropium-albuterol (DUONEB) 0.5-2.5 (3) MG/3ML SOLN Take 3 mLs by nebulization 2 (two) times daily. chronic respiratory failure  . latanoprost (XALATAN) 0.005 % ophthalmic solution Place 1 drop into both eyes at bedtime.  . lidocaine (LIDODERM) 5 % Place 1 patch onto the skin daily as needed. Remove & Discard patch within 12 hours or as directed by MD   . Menthol,  Topical Analgesic, (BIOFREEZE) 4 % GEL Apply 1 application topically every 8 (eight) hours. Apply to both knees  . Multiple Vitamins-Minerals (PRESERVISION AREDS 2 PO) Take 1 capsule by mouth 2 (two) times daily.   Marland Kitchen omeprazole (PRILOSEC) 20 MG capsule TAKE 1 CAPSULE EVERY DAY.  Marland Kitchen polyethylene glycol (MIRALAX / GLYCOLAX) packet Take 17 g by mouth daily as needed.   . potassium chloride SA (K-DUR,KLOR-CON) 20 MEQ tablet Take 40 mEq by mouth daily.   . Simethicone 80 MG TABS Take 1 tablet by mouth 4 (four) times daily as needed.  . sodium chloride 1 g tablet Take 1 g by mouth daily.  . Sodium Fluoride (PREVIDENT 5000 BOOSTER PLUS) 1.1 % PSTE Place onto teeth at bedtime.   No facility-administered encounter medications on file as of 02/17/2019.    ROS was provided with assistance of staff.  Review of Systems  Constitutional: Negative for activity change, appetite change, chills, diaphoresis, fatigue, fever and unexpected weight change.  HENT: Positive for hearing loss. Negative for congestion, facial swelling and voice change.   Respiratory: Positive for shortness of breath. Negative for cough and wheezing.        Chronic DOE  Cardiovascular: Positive for leg swelling.  Negative for chest pain and palpitations.  Gastrointestinal: Negative for abdominal distention, abdominal pain, constipation, diarrhea, nausea and vomiting.  Genitourinary: Negative for difficulty urinating, dysuria and urgency.  Musculoskeletal: Positive for arthralgias, back pain and gait problem.  Skin: Negative for color change and pallor.  Neurological: Negative for dizziness, speech difficulty, weakness and headaches.       Memory lapses.   Psychiatric/Behavioral: Negative for agitation, behavioral problems, confusion, hallucinations and sleep disturbance. The patient is not nervous/anxious.     Immunization History  Administered Date(s) Administered  . Influenza Whole 09/22/2008, 09/16/2012, 09/30/2013  . Influenza, High Dose Seasonal PF 09/25/2017  . Influenza,inj,Quad PF,6+ Mos 10/09/2018  . Influenza-Unspecified 10/02/2014, 09/15/2015, 09/27/2016  . Pneumococcal Conjugate-13 11/29/2017  . Pneumococcal Polysaccharide-23 08/17/2006  . Td 12/17/2005   Pertinent  Health Maintenance Due  Topic Date Due  . FOOT EXAM  07/19/1931  . URINE MICROALBUMIN  04/30/2008  . HEMOGLOBIN A1C  10/17/2017  . OPHTHALMOLOGY EXAM  12/27/2019  . INFLUENZA VACCINE  Completed  . DEXA SCAN  Completed  . PNA vac Low Risk Adult  Completed   Fall Risk  10/07/2018 09/19/2017 05/17/2016 04/20/2016 04/19/2016  Falls in the past year? Yes No No No No  Number falls in past yr: 1 - - - -  Injury with Fall? No - - - -   Functional Status Survey:    Vitals:   02/17/19 1407  BP: 140/60  Pulse: 64  Resp: 18  Temp: 98.5 F (36.9 C)  SpO2: 99%  Weight: 148 lb 12.8 oz (67.5 kg)  Height: 5' 4"  (1.626 m)   Body mass index is 25.54 kg/m. Physical Exam Constitutional:      General: She is not in acute distress.    Appearance: Normal appearance. She is normal weight. She is not ill-appearing, toxic-appearing or diaphoretic.  HENT:     Head: Normocephalic.     Nose: Nose normal.     Mouth/Throat:      Mouth: Mucous membranes are moist.  Eyes:     Extraocular Movements: Extraocular movements intact.     Pupils: Pupils are equal, round, and reactive to light.     Comments: Left upper peripheral visual filed missing.   Neck:     Musculoskeletal: Normal  range of motion and neck supple.  Cardiovascular:     Rate and Rhythm: Normal rate and regular rhythm.     Heart sounds: No murmur.  Pulmonary:     Effort: Pulmonary effort is normal.     Breath sounds: No wheezing, rhonchi or rales.  Abdominal:     General: Bowel sounds are normal. There is no distension.     Palpations: Abdomen is soft.     Tenderness: There is no abdominal tenderness. There is no guarding or rebound.  Musculoskeletal:     Right lower leg: Edema present.     Left lower leg: Edema present.     Comments: Trace edema. Motorized w/c to get around.   Skin:    General: Skin is warm and dry.  Neurological:     General: No focal deficit present.     Mental Status: She is alert. Mental status is at baseline.     Cranial Nerves: No cranial nerve deficit.     Motor: No weakness.     Coordination: Coordination normal.     Gait: Gait abnormal.     Comments: Oriented to person and place.   Psychiatric:        Mood and Affect: Mood normal.        Behavior: Behavior normal.        Thought Content: Thought content normal.        Judgment: Judgment normal.     Labs reviewed: Recent Labs    08/09/18 1519 08/10/18 0053 08/12/18 0318  08/25/18 09/25/18 10/21/18 10/23/18  NA 140 138 133*   < > 133 140 138 138  K 5.1 4.1 3.6   < > 3.8 3.3* 3.4 3.7  CL 102 97* 93*  --   --   --  101 101  CO2 29 26 28   --   --   --  29 28  GLUCOSE 101* 87 78  --   --   --   --   --   BUN 17 17 23    < > 20 18 19 17   CREATININE 0.92 0.99 0.89   < > 0.86 0.7 0.8 0.8  CALCIUM 9.7 9.8 8.9   < > 8.7  --  8.9 9.1   < > = values in this interval not displayed.   Recent Labs    08/19/18 08/21/18 08/25/18 09/25/18  AST 58 45 32 18  ALT 58 47  29 11  ALKPHOS 122 134 122 139*  BILITOT 0.8 0.6 0.4  --   PROT 6.4 6.2 6.1  --   ALBUMIN 2.8 2.7 2.6  --    Recent Labs    08/09/18 1519 08/19/18 09/25/18 10/09/18 12/23/18  WBC 7.1 9.8 3.1 6.0 5.0  NEUTROABS 5.8  --   --   --   --   HGB 12.2 11.6 9.9* 10.1* 11.2*  HCT 36.5 35.3 29* 30* 33*  MCV 92.9 93.6  --   --   --   PLT 297  --  211 263 177   Lab Results  Component Value Date   TSH 2.321 08/10/2018   Lab Results  Component Value Date   HGBA1C 5.3 04/16/2017   Lab Results  Component Value Date   CHOL 188 04/12/2016   HDL 62 04/12/2016   LDLCALC 102 04/12/2016   TRIG 122 04/12/2016   CHOLHDL 3.0 Ratio 06/07/2008    Significant Diagnostic Results in last 30 days:  No results found.  Assessment/Plan Hypokalemia  02/17/19 Na 137, K 2.4, Bun 18, creat 0.93, KCL 42mq po stat, then daily, BMP 02/19/19. She denied increased weakness/fatigue, muscle spasm, bloating/constipation, palpitation, muscle aches, tingling/numbness/SOB, mood changes/altered mentation.    Depression with anxiety Her mood is stable, continue Duloxetine 371mqd.  Blood loss anemia Stable, Hgb 11.2 12/23/18,  continue folate 40036mqd, Fe 240m23m, Vit B complex.    Pain in lower back Pain is stable, continue Tylenol 1000mg48m, 500mg 24m    Chronic respiratory failure with hypoxia (HCC) Stable, continue O2 supplement, DuoNeb bid.  GERD (gastroesophageal reflux disease) Stable, continue Omeprazole 20mg q43m Neuropathy Peripheral neuropathy, continue Gabapentin 100mg bi70m    Family/ staff Communication: plan of care reviewed with the patient and charge nurse.   Labs/tests ordered:  BMP 02/19/19  Time spend 25 minutes

## 2019-02-17 NOTE — Assessment & Plan Note (Signed)
Her mood is stable, continue Duloxetine 66m qd.

## 2019-02-17 NOTE — Assessment & Plan Note (Addendum)
Stable, continue Omeprazole 16m qd.

## 2019-02-17 NOTE — Assessment & Plan Note (Signed)
Pain is stable, continue Tylenol 1026m bid, 5024mqd.

## 2019-02-17 NOTE — Assessment & Plan Note (Signed)
Peripheral neuropathy, continue Gabapentin 176m bid.

## 2019-02-17 NOTE — Assessment & Plan Note (Signed)
Stable, Hgb 11.2 12/23/18,  continue folate 474mg qd, Fe 2444mqd, Vit B complex.

## 2019-02-17 NOTE — Assessment & Plan Note (Signed)
Stable, continue O2 supplement, DuoNeb bid.

## 2019-02-17 NOTE — Assessment & Plan Note (Signed)
02/17/19 Na 137, K 2.4, Bun 18, creat 0.93, KCL 12mq po stat, then daily, BMP 02/19/19. She denied increased weakness/fatigue, muscle spasm, bloating/constipation, palpitation, muscle aches, tingling/numbness/SOB, mood changes/altered mentation.

## 2019-02-18 DIAGNOSIS — E876 Hypokalemia: Secondary | ICD-10-CM | POA: Diagnosis not present

## 2019-02-21 ENCOUNTER — Encounter (HOSPITAL_COMMUNITY): Payer: Self-pay

## 2019-02-21 ENCOUNTER — Inpatient Hospital Stay (HOSPITAL_COMMUNITY)
Admission: EM | Admit: 2019-02-21 | Discharge: 2019-03-05 | DRG: 291 | Disposition: A | Discharge status: Skilled Nursing Facility | Payer: Medicare HMO | Attending: Internal Medicine | Admitting: Internal Medicine

## 2019-02-21 ENCOUNTER — Emergency Department (HOSPITAL_COMMUNITY): Payer: Medicare HMO

## 2019-02-21 ENCOUNTER — Other Ambulatory Visit: Payer: Self-pay

## 2019-02-21 DIAGNOSIS — E785 Hyperlipidemia, unspecified: Secondary | ICD-10-CM | POA: Diagnosis present

## 2019-02-21 DIAGNOSIS — Z9841 Cataract extraction status, right eye: Secondary | ICD-10-CM

## 2019-02-21 DIAGNOSIS — F329 Major depressive disorder, single episode, unspecified: Secondary | ICD-10-CM | POA: Diagnosis present

## 2019-02-21 DIAGNOSIS — J181 Lobar pneumonia, unspecified organism: Secondary | ICD-10-CM | POA: Diagnosis not present

## 2019-02-21 DIAGNOSIS — N3001 Acute cystitis with hematuria: Secondary | ICD-10-CM | POA: Diagnosis not present

## 2019-02-21 DIAGNOSIS — N3000 Acute cystitis without hematuria: Secondary | ICD-10-CM | POA: Diagnosis not present

## 2019-02-21 DIAGNOSIS — E871 Hypo-osmolality and hyponatremia: Secondary | ICD-10-CM | POA: Diagnosis present

## 2019-02-21 DIAGNOSIS — E119 Type 2 diabetes mellitus without complications: Secondary | ICD-10-CM | POA: Diagnosis not present

## 2019-02-21 DIAGNOSIS — R634 Abnormal weight loss: Secondary | ICD-10-CM | POA: Diagnosis present

## 2019-02-21 DIAGNOSIS — Z79899 Other long term (current) drug therapy: Secondary | ICD-10-CM

## 2019-02-21 DIAGNOSIS — Z923 Personal history of irradiation: Secondary | ICD-10-CM

## 2019-02-21 DIAGNOSIS — R531 Weakness: Secondary | ICD-10-CM | POA: Diagnosis not present

## 2019-02-21 DIAGNOSIS — J90 Pleural effusion, not elsewhere classified: Secondary | ICD-10-CM

## 2019-02-21 DIAGNOSIS — Z85118 Personal history of other malignant neoplasm of bronchus and lung: Secondary | ICD-10-CM

## 2019-02-21 DIAGNOSIS — M545 Low back pain: Secondary | ICD-10-CM | POA: Diagnosis present

## 2019-02-21 DIAGNOSIS — Z853 Personal history of malignant neoplasm of breast: Secondary | ICD-10-CM

## 2019-02-21 DIAGNOSIS — J918 Pleural effusion in other conditions classified elsewhere: Secondary | ICD-10-CM | POA: Diagnosis present

## 2019-02-21 DIAGNOSIS — N390 Urinary tract infection, site not specified: Secondary | ICD-10-CM | POA: Diagnosis not present

## 2019-02-21 DIAGNOSIS — Z7401 Bed confinement status: Secondary | ICD-10-CM | POA: Diagnosis not present

## 2019-02-21 DIAGNOSIS — I4581 Long QT syndrome: Secondary | ICD-10-CM | POA: Diagnosis not present

## 2019-02-21 DIAGNOSIS — J9621 Acute and chronic respiratory failure with hypoxia: Secondary | ICD-10-CM | POA: Diagnosis present

## 2019-02-21 DIAGNOSIS — Z66 Do not resuscitate: Secondary | ICD-10-CM | POA: Diagnosis present

## 2019-02-21 DIAGNOSIS — Z806 Family history of leukemia: Secondary | ICD-10-CM

## 2019-02-21 DIAGNOSIS — Z9889 Other specified postprocedural states: Secondary | ICD-10-CM

## 2019-02-21 DIAGNOSIS — I471 Supraventricular tachycardia: Secondary | ICD-10-CM | POA: Diagnosis present

## 2019-02-21 DIAGNOSIS — Z8249 Family history of ischemic heart disease and other diseases of the circulatory system: Secondary | ICD-10-CM

## 2019-02-21 DIAGNOSIS — I5033 Acute on chronic diastolic (congestive) heart failure: Secondary | ICD-10-CM | POA: Diagnosis present

## 2019-02-21 DIAGNOSIS — I340 Nonrheumatic mitral (valve) insufficiency: Secondary | ICD-10-CM | POA: Diagnosis not present

## 2019-02-21 DIAGNOSIS — M419 Scoliosis, unspecified: Secondary | ICD-10-CM | POA: Diagnosis present

## 2019-02-21 DIAGNOSIS — I160 Hypertensive urgency: Secondary | ICD-10-CM

## 2019-02-21 DIAGNOSIS — E860 Dehydration: Secondary | ICD-10-CM | POA: Diagnosis not present

## 2019-02-21 DIAGNOSIS — I361 Nonrheumatic tricuspid (valve) insufficiency: Secondary | ICD-10-CM | POA: Diagnosis not present

## 2019-02-21 DIAGNOSIS — Z9049 Acquired absence of other specified parts of digestive tract: Secondary | ICD-10-CM

## 2019-02-21 DIAGNOSIS — E876 Hypokalemia: Secondary | ICD-10-CM | POA: Diagnosis present

## 2019-02-21 DIAGNOSIS — E041 Nontoxic single thyroid nodule: Secondary | ICD-10-CM | POA: Diagnosis present

## 2019-02-21 DIAGNOSIS — R52 Pain, unspecified: Secondary | ICD-10-CM

## 2019-02-21 DIAGNOSIS — R0600 Dyspnea, unspecified: Secondary | ICD-10-CM

## 2019-02-21 DIAGNOSIS — F419 Anxiety disorder, unspecified: Secondary | ICD-10-CM | POA: Diagnosis present

## 2019-02-21 DIAGNOSIS — H409 Unspecified glaucoma: Secondary | ICD-10-CM | POA: Diagnosis present

## 2019-02-21 DIAGNOSIS — T502X5A Adverse effect of carbonic-anhydrase inhibitors, benzothiadiazides and other diuretics, initial encounter: Secondary | ICD-10-CM | POA: Diagnosis not present

## 2019-02-21 DIAGNOSIS — Z823 Family history of stroke: Secondary | ICD-10-CM

## 2019-02-21 DIAGNOSIS — R091 Pleurisy: Secondary | ICD-10-CM | POA: Diagnosis not present

## 2019-02-21 DIAGNOSIS — K219 Gastro-esophageal reflux disease without esophagitis: Secondary | ICD-10-CM | POA: Diagnosis present

## 2019-02-21 DIAGNOSIS — D649 Anemia, unspecified: Secondary | ICD-10-CM | POA: Diagnosis present

## 2019-02-21 DIAGNOSIS — Z9842 Cataract extraction status, left eye: Secondary | ICD-10-CM

## 2019-02-21 DIAGNOSIS — Z9981 Dependence on supplemental oxygen: Secondary | ICD-10-CM

## 2019-02-21 DIAGNOSIS — M810 Age-related osteoporosis without current pathological fracture: Secondary | ICD-10-CM | POA: Diagnosis present

## 2019-02-21 DIAGNOSIS — G4700 Insomnia, unspecified: Secondary | ICD-10-CM | POA: Diagnosis present

## 2019-02-21 DIAGNOSIS — I509 Heart failure, unspecified: Secondary | ICD-10-CM

## 2019-02-21 DIAGNOSIS — R69 Illness, unspecified: Secondary | ICD-10-CM | POA: Diagnosis not present

## 2019-02-21 DIAGNOSIS — I10 Essential (primary) hypertension: Secondary | ICD-10-CM | POA: Diagnosis not present

## 2019-02-21 DIAGNOSIS — Z90710 Acquired absence of both cervix and uterus: Secondary | ICD-10-CM

## 2019-02-21 DIAGNOSIS — M2550 Pain in unspecified joint: Secondary | ICD-10-CM | POA: Diagnosis not present

## 2019-02-21 DIAGNOSIS — R5381 Other malaise: Secondary | ICD-10-CM | POA: Diagnosis not present

## 2019-02-21 DIAGNOSIS — Z8262 Family history of osteoporosis: Secondary | ICD-10-CM

## 2019-02-21 DIAGNOSIS — R7989 Other specified abnormal findings of blood chemistry: Secondary | ICD-10-CM | POA: Diagnosis not present

## 2019-02-21 DIAGNOSIS — N3281 Overactive bladder: Secondary | ICD-10-CM | POA: Diagnosis present

## 2019-02-21 DIAGNOSIS — I110 Hypertensive heart disease with heart failure: Secondary | ICD-10-CM | POA: Diagnosis not present

## 2019-02-21 DIAGNOSIS — B961 Klebsiella pneumoniae [K. pneumoniae] as the cause of diseases classified elsewhere: Secondary | ICD-10-CM | POA: Diagnosis present

## 2019-02-21 DIAGNOSIS — J189 Pneumonia, unspecified organism: Secondary | ICD-10-CM | POA: Diagnosis not present

## 2019-02-21 LAB — COMPREHENSIVE METABOLIC PANEL
ALT: 39 U/L (ref 0–44)
ANION GAP: 12 (ref 5–15)
AST: 54 U/L — ABNORMAL HIGH (ref 15–41)
Albumin: 3.8 g/dL (ref 3.5–5.0)
Alkaline Phosphatase: 161 U/L — ABNORMAL HIGH (ref 38–126)
BUN: 18 mg/dL (ref 8–23)
CO2: 30 mmol/L (ref 22–32)
Calcium: 8.9 mg/dL (ref 8.9–10.3)
Chloride: 90 mmol/L — ABNORMAL LOW (ref 98–111)
Creatinine, Ser: 0.78 mg/dL (ref 0.44–1.00)
GFR calc Af Amer: >60 mL/min (ref >60)
Glucose, Bld: 98 mg/dL (ref 70–99)
Potassium: 3.3 mmol/L — ABNORMAL LOW (ref 3.5–5.1)
Sodium: 132 mmol/L — ABNORMAL LOW (ref 135–145)
Total Bilirubin: 1.7 mg/dL — ABNORMAL HIGH (ref 0.3–1.2)
Total Protein: 8.4 g/dL — ABNORMAL HIGH (ref 6.5–8.1)

## 2019-02-21 LAB — TSH: TSH: 1.964 u[IU]/mL (ref 0.350–4.500)

## 2019-02-21 LAB — CBC WITH DIFFERENTIAL/PLATELET
Abs Immature Granulocytes: 0.02 10*3/uL (ref 0.00–0.07)
Basophils Absolute: 0.1 10*3/uL (ref 0.0–0.1)
Basophils Relative: 1 %
EOS PCT: 0 %
Eosinophils Absolute: 0 10*3/uL (ref 0.0–0.5)
HCT: 40 % (ref 36.0–46.0)
Hemoglobin: 12.9 g/dL (ref 12.0–15.0)
Immature Granulocytes: 0 %
Lymphocytes Relative: 9 %
Lymphs Abs: 0.7 10*3/uL (ref 0.7–4.0)
MCH: 32.2 pg (ref 26.0–34.0)
MCHC: 32.3 g/dL (ref 30.0–36.0)
MCV: 99.8 fL (ref 80.0–100.0)
Monocytes Absolute: 0.7 10*3/uL (ref 0.1–1.0)
Monocytes Relative: 9 %
Neutro Abs: 6 10*3/uL (ref 1.7–7.7)
Neutrophils Relative %: 81 %
Platelets: 175 10*3/uL (ref 150–400)
RBC: 4.01 MIL/uL (ref 3.87–5.11)
RDW: 16.6 % — ABNORMAL HIGH (ref 11.5–15.5)
WBC: 7.5 10*3/uL (ref 4.0–10.5)
nRBC: 0 % (ref 0.0–0.2)

## 2019-02-21 LAB — MAGNESIUM: MAGNESIUM: 1.5 mg/dL — AB (ref 1.7–2.4)

## 2019-02-21 LAB — PROTIME-INR
INR: 1.1 (ref 0.8–1.2)
Prothrombin Time: 14.3 seconds (ref 11.4–15.2)

## 2019-02-21 LAB — URINALYSIS, ROUTINE W REFLEX MICROSCOPIC
Bilirubin Urine: NEGATIVE
Glucose, UA: NEGATIVE mg/dL
Hgb urine dipstick: NEGATIVE
KETONES UR: NEGATIVE mg/dL
Nitrite: NEGATIVE
PROTEIN: 100 mg/dL — AB
Specific Gravity, Urine: 1.013 (ref 1.005–1.030)
pH: 6 (ref 5.0–8.0)

## 2019-02-21 LAB — LIPASE, BLOOD: Lipase: 39 U/L (ref 11–51)

## 2019-02-21 LAB — TROPONIN I: TROPONIN I: 0.05 ng/mL — AB (ref <0.03)

## 2019-02-21 LAB — PHOSPHORUS: Phosphorus: 3 mg/dL (ref 2.5–4.6)

## 2019-02-21 LAB — LACTIC ACID, PLASMA: Lactic Acid, Venous: 1.5 mmol/L (ref 0.5–1.9)

## 2019-02-21 LAB — BRAIN NATRIURETIC PEPTIDE: B Natriuretic Peptide: 2482.2 pg/mL — ABNORMAL HIGH (ref 0.0–100.0)

## 2019-02-21 MED ORDER — METOPROLOL TARTRATE 5 MG/5ML IV SOLN
5.0000 mg | Freq: Once | INTRAVENOUS | Status: AC
  Administered 2019-02-21: 5 mg via INTRAVENOUS
  Filled 2019-02-21: qty 5

## 2019-02-21 MED ORDER — B COMPLEX-B12 PO TABS: 1.0000 | ORAL_TABLET | Freq: Every day | ORAL | Status: DC

## 2019-02-21 MED ORDER — B COMPLEX-C PO TABS
1.0000 | ORAL_TABLET | Freq: Every day | ORAL | Status: DC
  Administered 2019-02-22 – 2019-03-05 (×12): 1 via ORAL
  Filled 2019-02-21 (×12): qty 1

## 2019-02-21 MED ORDER — LOSARTAN POTASSIUM 50 MG PO TABS
50.0000 mg | ORAL_TABLET | Freq: Every day | ORAL | Status: DC
  Administered 2019-02-21 – 2019-03-05 (×13): 50 mg via ORAL
  Filled 2019-02-21 (×13): qty 1

## 2019-02-21 MED ORDER — HYDRALAZINE HCL 20 MG/ML IJ SOLN
10.0000 mg | Freq: Once | INTRAMUSCULAR | Status: AC
  Administered 2019-02-21: 10 mg via INTRAVENOUS
  Filled 2019-02-21: qty 1

## 2019-02-21 MED ORDER — ATENOLOL 25 MG PO TABS
25.0000 mg | ORAL_TABLET | Freq: Two times a day (BID) | ORAL | Status: DC
  Administered 2019-02-21 – 2019-03-03 (×18): 25 mg via ORAL
  Filled 2019-02-21 (×20): qty 1

## 2019-02-21 MED ORDER — DULOXETINE HCL 30 MG PO CPEP
30.0000 mg | ORAL_CAPSULE | Freq: Every day | ORAL | Status: DC
  Administered 2019-02-22 – 2019-03-05 (×12): 30 mg via ORAL
  Filled 2019-02-21 (×12): qty 1

## 2019-02-21 MED ORDER — MAGNESIUM SULFATE 2 GM/50ML IV SOLN
2.0000 g | Freq: Once | INTRAVENOUS | Status: AC
  Administered 2019-02-21: 2 g via INTRAVENOUS
  Filled 2019-02-21: qty 50

## 2019-02-21 MED ORDER — LATANOPROST 0.005 % OP SOLN
1.0000 [drp] | Freq: Every day | OPHTHALMIC | Status: DC
  Administered 2019-02-21 – 2019-03-05 (×13): 1 [drp] via OPHTHALMIC
  Filled 2019-02-21: qty 2.5

## 2019-02-21 MED ORDER — PANTOPRAZOLE SODIUM 40 MG PO TBEC
40.0000 mg | DELAYED_RELEASE_TABLET | Freq: Every day | ORAL | Status: DC
  Administered 2019-02-22 – 2019-03-05 (×12): 40 mg via ORAL
  Filled 2019-02-21 (×12): qty 1

## 2019-02-21 MED ORDER — PRESERVISION AREDS 2 PO CAPS: 1.0000 | ORAL_CAPSULE | Freq: Two times a day (BID) | ORAL | Status: DC

## 2019-02-21 MED ORDER — FERROUS GLUCONATE 324 (38 FE) MG PO TABS
324.0000 mg | ORAL_TABLET | Freq: Every day | ORAL | Status: DC
  Administered 2019-02-22 – 2019-03-05 (×12): 324 mg via ORAL
  Filled 2019-02-21 (×12): qty 1

## 2019-02-21 MED ORDER — POLYETHYLENE GLYCOL 3350 17 G PO PACK
17.0000 g | PACK | Freq: Every day | ORAL | Status: DC | PRN
  Administered 2019-03-04: 17 g via ORAL
  Filled 2019-02-21: qty 1

## 2019-02-21 MED ORDER — FUROSEMIDE 10 MG/ML IJ SOLN
40.0000 mg | Freq: Once | INTRAMUSCULAR | Status: AC
  Administered 2019-02-21: 40 mg via INTRAVENOUS
  Filled 2019-02-21: qty 4

## 2019-02-21 MED ORDER — NITROGLYCERIN 2 % TD OINT
1.0000 [in_us] | TOPICAL_OINTMENT | Freq: Four times a day (QID) | TRANSDERMAL | Status: AC
  Administered 2019-02-21: 1 [in_us] via TOPICAL
  Filled 2019-02-21: qty 1

## 2019-02-21 MED ORDER — HYDRALAZINE HCL 20 MG/ML IJ SOLN
10.0000 mg | Freq: Four times a day (QID) | INTRAMUSCULAR | Status: DC | PRN
  Administered 2019-02-21 – 2019-03-04 (×3): 10 mg via INTRAVENOUS
  Filled 2019-02-21 (×3): qty 1

## 2019-02-21 MED ORDER — FOLIC ACID 1 MG PO TABS
0.5000 mg | ORAL_TABLET | Freq: Every day | ORAL | Status: DC
  Administered 2019-02-22 – 2019-02-27 (×6): 0.5 mg via ORAL
  Administered 2019-02-28: 1 mg via ORAL
  Administered 2019-03-01 – 2019-03-05 (×5): 0.5 mg via ORAL
  Filled 2019-02-21 (×12): qty 1

## 2019-02-21 MED ORDER — FOLIC ACID 400 MCG PO TABS: 400.0000 ug | ORAL_TABLET | Freq: Every day | ORAL | Status: DC

## 2019-02-21 MED ORDER — FERROUS GLUCONATE 240 (27 FE) MG PO TABS: 240.0000 mg | ORAL_TABLET | Freq: Every day | ORAL | Status: DC

## 2019-02-21 MED ORDER — SODIUM CHLORIDE 0.9 % IV SOLN
1.0000 g | Freq: Once | INTRAVENOUS | Status: AC
  Administered 2019-02-21: 1 g via INTRAVENOUS
  Filled 2019-02-21: qty 10

## 2019-02-21 MED ORDER — BOOST / RESOURCE BREEZE PO LIQD CUSTOM
237.0000 mL | Freq: Every day | ORAL | Status: DC
  Administered 2019-02-23 – 2019-03-04 (×9): 1 via ORAL

## 2019-02-21 MED ORDER — GABAPENTIN 100 MG PO CAPS
100.0000 mg | ORAL_CAPSULE | Freq: Two times a day (BID) | ORAL | Status: DC
  Administered 2019-02-22 – 2019-03-05 (×24): 100 mg via ORAL
  Filled 2019-02-21 (×24): qty 1

## 2019-02-21 MED ORDER — ACETAMINOPHEN 325 MG PO TABS
650.0000 mg | ORAL_TABLET | Freq: Four times a day (QID) | ORAL | Status: DC | PRN
  Administered 2019-02-21 – 2019-03-05 (×9): 650 mg via ORAL
  Filled 2019-02-21 (×9): qty 2

## 2019-02-21 MED ORDER — SODIUM CHLORIDE 0.9% FLUSH
3.0000 mL | Freq: Two times a day (BID) | INTRAVENOUS | Status: DC
  Administered 2019-02-21 – 2019-03-05 (×21): 3 mL via INTRAVENOUS

## 2019-02-21 MED ORDER — ACETAMINOPHEN 500 MG PO TABS: 1000.0000 mg | ORAL_TABLET | Freq: Two times a day (BID) | ORAL | Status: DC | PRN

## 2019-02-21 MED ORDER — DORZOLAMIDE HCL-TIMOLOL MAL 2-0.5 % OP SOLN
1.0000 [drp] | Freq: Two times a day (BID) | OPHTHALMIC | Status: DC
  Administered 2019-02-23 – 2019-03-05 (×12): 1 [drp] via OPHTHALMIC
  Filled 2019-02-21: qty 10

## 2019-02-21 MED ORDER — IPRATROPIUM-ALBUTEROL 0.5-2.5 (3) MG/3ML IN SOLN
3.0000 mL | Freq: Four times a day (QID) | RESPIRATORY_TRACT | Status: DC | PRN
  Administered 2019-02-21 – 2019-02-25 (×3): 3 mL via RESPIRATORY_TRACT
  Filled 2019-02-21 (×3): qty 3

## 2019-02-21 MED ORDER — PROSIGHT PO TABS
1.0000 | ORAL_TABLET | Freq: Every day | ORAL | Status: DC
  Administered 2019-02-22 – 2019-03-05 (×12): 1 via ORAL
  Filled 2019-02-21 (×12): qty 1

## 2019-02-21 MED ORDER — ENOXAPARIN SODIUM 40 MG/0.4ML ~~LOC~~ SOLN
40.0000 mg | SUBCUTANEOUS | Status: DC
  Administered 2019-02-22 – 2019-02-25 (×4): 40 mg via SUBCUTANEOUS
  Filled 2019-02-21 (×4): qty 0.4

## 2019-02-21 MED ORDER — CEFDINIR 300 MG PO CAPS
300.0000 mg | ORAL_CAPSULE | Freq: Two times a day (BID) | ORAL | Status: AC
  Administered 2019-02-22 – 2019-02-24 (×6): 300 mg via ORAL
  Filled 2019-02-21 (×6): qty 1

## 2019-02-21 MED ORDER — MAGNESIUM OXIDE 400 (241.3 MG) MG PO TABS
400.0000 mg | ORAL_TABLET | Freq: Two times a day (BID) | ORAL | Status: DC
  Administered 2019-02-21 – 2019-02-26 (×10): 400 mg via ORAL
  Filled 2019-02-21 (×10): qty 1

## 2019-02-21 MED ORDER — POTASSIUM CHLORIDE CRYS ER 20 MEQ PO TBCR: 40.0000 meq | EXTENDED_RELEASE_TABLET | Freq: Every day | ORAL | Status: DC

## 2019-02-21 MED ORDER — POTASSIUM CHLORIDE CRYS ER 20 MEQ PO TBCR
40.0000 meq | EXTENDED_RELEASE_TABLET | Freq: Two times a day (BID) | ORAL | Status: DC
  Administered 2019-02-21: 40 meq via ORAL
  Filled 2019-02-21: qty 2

## 2019-02-21 NOTE — H&P (Signed)
History and Physical  ZELLA DEWAN YBW:[id number] DOB: 1921-12-16 DOA: 02/21/2019 1515  Referring physician: Calla Kicks [facility] ED) PCP: Virgie Dad, MD   HISTORY   Chief Complaint: generalized weakness and dyspnea  HPI: ARLO BUFFONE is a 85 y.o. female with medical hx significant for diastolic CHF, HTN, diet controlled T2DM, hx of PSVT, remote hx of breast CA, depression, supplemental O2 dependence (for unclear reason, ?diastolic CHF) who presented from [facility] with 1-2 weeks of progressive generalized weakness and increased dyspnea on exertion. Patient reports that she uses 1-1.5L supplemental O2 but for the past few days has felt that the air has not been getting into her lungs. Previously has been able to transfer from bed to chair with minimal assistance, but no longer able to due to weakness. Also reports increasing lower extremity edema and orthopnea x 2 weeks. She is not currently prescribed diuretics. States that in the past, she did not tolerate frequent urination when taking diuretics. Denies cough, URI sx, fever/chills.   Review of Systems:  - no fevers/chills - no cough - no chest pain - no nausea/vomiting; no tarry, melanotic or bloody stools - no dysuria, increased urinary frequency - no focal neuro weakness Rest of systems reviewed are negative, except as per above history.   ED course:  Vitals Blood pressure (!) 189/100, pulse 67, temperature 97.7 F (36.5 C), temperature source Oral, resp. rate 15, SpO2 97 %. Received lasix 73m IV x 1; hydralazine 191mIV x 1; metoprolol IV 66m80m 1; ceftriaxone 1gm x 1; nitro paste  Past Medical History:  Diagnosis Date  . Allergic rhinitis   . Anemia    NOS  . Anxiety   . Biceps tendon rupture    Bilateral  . Breast cancer ([place]   Left s/p lumpectomy and XRT   . Bronchiectasis ([place]   Noted on chest x-ray  . Complication of anesthesia    slow to wake up  . Cough 2014  . Depression   . Diverticulosis   .  Elevated liver enzymes    Biopsy consistent with steatohepatitis  . Fundic gland polyps of stomach, benign   . GERD (gastroesophageal reflux disease) 06/2007   EGD Dr RouGala Romneym [facility], multiple fundic gland polyps  . Glaucoma 2013   both eyes  . Hemorrhoids   . Hiatal hernia   . Hx of radiation therapy 07/17/10 to 07/21/10   SRS LLL lung  . Hyperlipidemia   . Insomnia   . Low back pain    scoliosis  . Lung cancer ([place]   Left 2011 - rad x 3  . Lymphedema    Left arm  . Osteoarthritis   . Osteopenia 10/25/2016  . Osteoporosis, senile   . Overactive bladder   . Pneumonia    RLL with sepsis  . PSVT (paroxysmal supraventricular tachycardia) ([place] . Rheumatic fever    Age [age] Right thyroid nodule   . Steatohepatitis    liver bx  . Type 2 diabetes mellitus ([place]  Past Surgical History:  Procedure Laterality Date  . ABDOMINAL HYSTERECTOMY  1964  . APPENDECTOMY  1964  . BIOPSY THYROID  11/2008  . BREAST BIOPSY     X 3 w/ cystectomy  . BREAST LUMPECTOMY  2001  . CATARACT EXTRACTION Bilateral 2003   Lens implant Dr. EpeCharise Killian CHOLECYSTECTOMY  1965  . COLONOSCOPY  09/11/2011   Procedure: COLONOSCOPY;  Surgeon:  Dorothyann Peng, MD;  [facility];  Service: Endoscopy;  Laterality: N/A;  . COLONOSCOPY  2005 /2012   Dr. Lucianne Muss- diverticulosis, ext hemorrhoids.  . COLONOSCOPY WITH PROPOFOL N/A 11/09/2017   Procedure: COLONOSCOPY WITH PROPOFOL;  Surgeon: Mauri Pole, MD;  Location: [facility];  Service: Endoscopy;  Laterality: N/A;  . COLONOSCOPY WITH PROPOFOL N/A 11/12/2017   Procedure: COLONOSCOPY WITH PROPOFOL;  Surgeon: Ladene Artist, MD;  Location: [facility];  Service: Endoscopy;  Laterality: N/A;  . CYSTOSCOPY  2007  . ESOPHAGOGASTRODUODENOSCOPY  09/11/2011   Procedure: ESOPHAGOGASTRODUODENOSCOPY (EGD);  Surgeon: Dorothyann Peng, MD;  [facility];  Service: Endoscopy;  Laterality: N/A;  . ESOPHAGOGASTRODUODENOSCOPY (EGD) WITH PROPOFOL N/A  11/08/2017   Procedure: ESOPHAGOGASTRODUODENOSCOPY (EGD) WITH PROPOFOL;  Surgeon: Mauri Pole, MD;  Location: [facility];  Service: Endoscopy;  Laterality: N/A;  . LIVER BIOPSY  2008  . LUNG BIOPSY Left 06/06/2010  . REVISION TOTAL HIP ARTHROPLASTY  2007   Left  . SKIN CANCER EXCISION     nose and left lower cheek  . TONSILLECTOMY  1930  . TOTAL HIP ARTHROPLASTY Bilateral 2005 & 2007    Dr. Earl Lagos. Aline Brochure     Social History:  reports that she has never smoked. She has never used smokeless tobacco. She reports that she does not drink alcohol or use drugs.  Allergies  Allergen Reactions  . Augmentin [Amoxicillin-Pot Clavulanate] Other (See Comments)    unknown  . Ibuprofen Hives  . Naproxen Other (See Comments)    unknown  . Statins Other (See Comments)    Feels like knives in stomach  . Surgilube [Gyne-Moistrin]     Rectal itching, burning  . Tape Rash    Family History  Problem Relation Age of Onset  . Heart failure Mother   . Osteoporosis Mother   . Hypertension Father   . Stroke Father   . Heart failure Father   . Leukemia Brother   . Heart failure Brother   . Cancer Maternal Grandmother   . Stroke Maternal Grandfather   . Cancer Other       Prior to Admission medications   Medication Sig Start Date End Date Taking? Authorizing Provider  acetaminophen (TYLENOL) 500 MG tablet Take 1,000 mg by mouth 2 (two) times daily.    [provider]  acetaminophen (TYLENOL) 500 MG tablet Take 500 mg by mouth daily.    [provider]  atenolol (TENORMIN) 25 MG tablet Take 1 tablet (25 mg total) by mouth 2 (two) times daily. 08/11/18   Georgette Shell, MD  B Complex Vitamins (B COMPLEX-B12) TABS Take 1 tablet by mouth daily.    [provider]  dorzolamide-timolol (COSOPT) 22.3-6.8 MG/ML ophthalmic solution Place 1 drop into the left eye 2 (two) times daily.    [provider]  DULoxetine (CYMBALTA) 30 MG capsule Take 30 mg  by mouth daily. 07/29/18   [provider]  feeding supplement (BOOST / RESOURCE BREEZE) LIQD Take 237 mLs by mouth 2 (two) times daily.    [provider]  ferrous gluconate (FERGON) 240 (27 FE) MG tablet Take 240 mg by mouth daily.    [provider]  folic acid (FOLVITE) 735 MCG tablet Take 400 mcg by mouth daily.    [provider]  gabapentin (NEURONTIN) 100 MG capsule Take 100 mg by mouth 2 (two) times daily.    [provider]  ipratropium-albuterol (DUONEB) 0.5-2.5 (3) MG/3ML SOLN  Take 3 mLs by nebulization 2 (two) times daily. chronic respiratory failure    [provider]  latanoprost (XALATAN) 0.005 % ophthalmic solution Place 1 drop into both eyes at bedtime.    [provider]  lidocaine (LIDODERM) 5 % Place 1 patch onto the skin daily as needed. Remove & Discard patch within 12 hours or as directed by MD     [provider]  Menthol, Topical Analgesic, (BIOFREEZE) 4 % GEL Apply 1 application topically every 8 (eight) hours. Apply to both knees    [provider]  Multiple Vitamins-Minerals (PRESERVISION AREDS 2 PO) Take 1 capsule by mouth 2 (two) times daily.     [provider]  omeprazole (PRILOSEC) 20 MG capsule TAKE 1 CAPSULE EVERY DAY. 07/05/17   Blanchie Serve, MD  polyethylene glycol (MIRALAX / GLYCOLAX) packet Take 17 g by mouth daily as needed.     [provider]  potassium chloride SA (K-DUR,KLOR-CON) 20 MEQ tablet Take 40 mEq by mouth daily.  02/18/19   [provider]  Simethicone 80 MG TABS Take 1 tablet by mouth 4 (four) times daily as needed.    [provider]  sodium chloride 1 g tablet Take 1 g by mouth daily.    [provider]  Sodium Fluoride (PREVIDENT 5000 BOOSTER PLUS) 1.1 % PSTE Place onto teeth at bedtime.    [provider]    PHYSICAL EXAM   Temp:  [97.7 F (36.5 C)] 97.7 F (36.5 C) (03/07 1528) Pulse Rate:  [58-93] 67  (03/07 1930) Resp:  [15-23] 15 (03/07 1930) BP: (189-212)/(77-128) 189/100 (03/07 1930) SpO2:  [92 %-100 %] 97 % (03/07 1930)  BP (!) 189/100   Pulse 67   Temp 97.7 F (36.5 C) (Oral)   Resp 15   SpO2 97%    GEN thin elderly caucasian female; resting in bed, uncomfortable, appears dyspneic  HEENT NCAT EOM intact PERRL; clear oropharynx, no cervical LAD; dry mucus membranes  JVP estimated 11 cm H2O above RA; + HJR ; no carotid bruits b/l ;  CV regular normal rate; normal S1 and S2; no m/r/g; +S4; PMI diffuse; no parasternal heave  RESP  Crackles up to upper L lung and R base crackles; diminished over L lung; breathing labored with +accessory mm use;   ABD soft NT ND +normoactive BS  EXT warm throughout b/l; 3+ pitting peripheral edema to thighs  PULSES  DP and radials 2+ intact b/l  SKIN/MSK no rashes or lesions  NEURO/PSYCH AAOx4; no focal deficits   DATA   LABS ON ADMISSION:  Basic Metabolic Panel: Recent Labs  Lab 02/21/19 1721  NA 132*  K 3.3*  CL 90*  CO2 30  GLUCOSE 98  BUN 18  CREATININE 0.78  CALCIUM 8.9  MG 1.5*  PHOS 3.0   CBC: Recent Labs  Lab 02/21/19 1721  WBC 7.5  NEUTROABS 6.0  HGB 12.9  HCT 40.0  MCV 99.8  PLT 175   Liver Function Tests: Recent Labs  Lab 02/21/19 1721  AST 54*  ALT 39  ALKPHOS 161*  BILITOT 1.7*  PROT 8.4*  ALBUMIN 3.8   Recent Labs  Lab 02/21/19 1721  LIPASE 39   No results for input(s): AMMONIA in the last 168 hours. Coagulation:  Lab Results  Component Value Date   INR 1.1 02/21/2019   INR 1.00 11/03/2017   INR 1.03 09/13/2011   No results found for: PTT Lactic Acid, Venous:  Component Value Date/Time   LATICACIDVEN 1.5 02/21/2019 1746   Cardiac Enzymes: Recent Labs  Lab 02/21/19 1721  TROPONINI 0.05*   Urinalysis:    Component Value Date/Time   COLORURINE YELLOW 02/21/2019 1606   APPEARANCEUR CLEAR 02/21/2019 1606   LABSPEC 1.013 02/21/2019 1606   PHURINE 6.0 02/21/2019 1606    GLUCOSEU NEGATIVE 02/21/2019 1606   HGBUR NEGATIVE 02/21/2019 1606   BILIRUBINUR NEGATIVE 02/21/2019 1606   KETONESUR NEGATIVE 02/21/2019 1606   PROTEINUR 100 (A) 02/21/2019 1606   NITRITE NEGATIVE 02/21/2019 1606   LEUKOCYTESUR SMALL (A) 02/21/2019 1606    BNP (last 3 results) No results for input(s): PROBNP in the last 8760 hours. CBG: No results for input(s): GLUCAP in the last 168 hours.  Radiological Exams on Admission: Dg Chest 1 View  Result Date: 02/21/2019 CLINICAL DATA:  Weakness, shortness of Breath EXAM: CHEST  1 VIEW COMPARISON:  08/11/2018 FINDINGS: Large left pleural effusion, slightly increased since prior study. Cardiomegaly with vascular congestion. Left base atelectasis. Interstitial prominence throughout the lungs could reflect interstitial edema. IMPRESSION: Cardiomegaly with vascular congestion and probable mild interstitial edema. Large left pleural effusion with left base atelectasis. Electronically Signed   By: Rolm Baptise M.D.   On: 02/21/2019 17:06   Ct Head Wo Contrast  Result Date: 02/21/2019 CLINICAL DATA:  Weakness.  Hypokalemia.  Elevated blood pressure. EXAM: CT HEAD WITHOUT CONTRAST TECHNIQUE: Contiguous axial images were obtained from the base of the skull through the vertex without intravenous contrast. COMPARISON:  August 09, 2017 FINDINGS: Brain: No subdural, epidural, or subarachnoid hemorrhage identified. Cerebellum, brainstem, and basal cisterns are normal. Ventricles and sulci are unchanged and unremarkable. Moderate to severe white matter changes are similar in the interval. No acute cortical ischemia or infarct. No mass effect or midline shift. Vascular: No hyperdense vessel or unexpected calcification. Skull: Normal. Negative for fracture or focal lesion. Sinuses/Orbits: No acute finding. Other: None. IMPRESSION: 1. Chronic white matter changes. No acute intracranial abnormalities noted. Electronically Signed   By: Dorise Bullion III M.D   On:  02/21/2019 17:07    EKG: Independently reviewed. Sinus at 87 bpm with PAC and QTc 520  I have reviewed the patient's previous electronic chart records, labs, and other data.   ASSESSMENT AND PLAN   Assessment: MIMI DEBELLIS is a 85 y.o. female with medical hx significant for diastolic CHF, HTN, diet controlled T2DM, hx of PSVT, remote hx of breast CA, depression, supplemental O2 dependence (for unclear reason, ?diastolic CHF) who presented with generalized weakness, increased dyspnea on exertion, and peripheral edema x 1 week. Grossly volume overloaded with large left sided pleural effusion on CXR, BNP in 2600s (unclear baseline). No clear evidence of infection on admission; borderline UA without definite symptoms. Patient is prescribed sodium tablet per chart review and is currently not prescribed diuretics -- she stated that she had been tired of frequent urination. Blood pressures are also elevated at 646-803 systolics. Last cardiology evaluation for pulm HTN concern thought that primary pulm HTN unlikely given advanced age and causes for secondary pulm HTN, ie HTN and diastolic HF.  Will continue IV diuresis and initiate BP control.    Active Problems:   Acute decompensated heart failure ([place])   Plan:   # Acute decompensated heart failure with known diastolic HFpEF (EF 21% in 2019) and suspected NICM  > likely related to sodium tablets, uncontrolled HTN. No significant valvular disease on TTE in 2019 despite hx of rheumatic fever  - unknown dry  weight but weight was 61 kg in Oct 2019 - IV diuresis: s/p 40 mg IV lasix in ED - redose with 84m IV in AM and uptitrate as needed - goal net negative 2L per day - strict ins/outs - daily weights - fluid and dietary sodium restriction - add on ARB for blood pressure control as below - b-blocker: continue home atenolol (no evidence for systolic failure) - high goal lyte repletion - stop sodium tablet and discontinue from med list -  compression stockings - PT/OT ordered - would not repeat TTE at this time unless no improvement with diuresis - may benefit from addition of spironolactone   # Large L sided pleural effusion - likely due to ADHF as above  - if resp sx not improve with diuresis, re-image chest and consider drain  - continue [place] O2 for sats > 95%  - duonebs q6h prn wheezing  # Borderline UA with mod WBC and bacteria - s/p ceftriaxone IV gm x 1 in ED - treat empirically with cefdinir BID x 3 more days - urine culture pending  # Uncontrolled HTN > systolic in 1037-048Gon admission - resume home atenolol 257mBID - start losartan 5040maily - s/p nitropaste in ED x 1 time dose and hydral 27m12m x 1  - prn hydralazine 27mg79mq6h prn SBP > 170 - consider adding spironolactone  # Chronic macular degeneration - resume home eye drops - resume home AREDS vitamin  # Chronic hypokalemia and hypomag on admission > may limit diuresis - resume K supplement as standing K 40 mEq BID - ordered mag 2gm IV x 1 overnight - start mag 400mg 79my and uptitrate as needed  # Chronic pain - resume gabapentin 100mg B14m tylenol 650mg pr54mh   # Chronic depresison - resume home cymbalta      DVT Prophylaxis: lovenox Code Status:  DNR Family Communication: patient and son at bedside Disposition Plan: admit to telemetry; dispo pending diuresis and PT/OT evaluation  Patient contact: Extended Emergency Contact Information Primary Emergency Contact: Neiswonger,BraDenzil Hughesof [place]Phone: [phone]: Son Secondary Emergency Contact: Furukawa,Lance [phone]: [phone]: Son  Time spent: > 35 mins   [facility] Pager [phone], please contact night-coverage [email or web address] Password [email or web address]0, 8:08 PM

## 2019-02-21 NOTE — ED Notes (Signed)
ED TO INPATIENT HANDOFF REPORT  ED Nurse Name and Phone #:  Sadie Haber. RN S Name/Age/Gender [name] 85 y.o. female Room/Bed: WA11/WA11  Code Status   Code Status: DNR  Home/SNF/Other Nursing Home Patient oriented to: self, place, time and situation Is this baseline? Yes   Triage Complete: Triage complete  Chief Complaint Gen Weakness  Triage Note Pt BIB [facility] from [place] with c/o weakness and hypokalemia that reportedly evaluated by the facility. Pt BP is elevated, only complaint is weakness.     Allergies Allergies  Allergen Reactions  . Augmentin [Amoxicillin-Pot Clavulanate] Other (See Comments)    unknown  . Ibuprofen Hives  . Naproxen Other (See Comments)    unknown  . Statins Other (See Comments)    Feels like knives in stomach  . Surgilube [Gyne-Moistrin]     Rectal itching, burning  . Tape Rash    Level of Care/Admitting Diagnosis ED Disposition    ED Disposition Condition [facility] Area: [place] [[id number]]  Level of Care: Telemetry [5]  Admit to tele based on following criteria: Acute CHF  Diagnosis: Acute decompensated heart failure [facility]) [[id number]]  Admitting Physician: Colbert Ewing [[id number]]  Attending Physician: Colbert Ewing [[id number]]  Estimated length of stay: 3 - 4 days  Certification:: I certify this patient will need inpatient services for at least 2 midnights  PT Class (Do Not Modify): Inpatient [101]  PT Acc Code (Do Not Modify): Private [1]       B Medical/Surgery History Past Medical History:  Diagnosis Date  . Allergic rhinitis   . Anemia    NOS  . Anxiety   . Biceps tendon rupture    Bilateral  . Breast cancer ([place]) 2001   Left s/p lumpectomy and XRT   . Bronchiectasis ([place]) 2014   Noted on chest x-ray  . Complication of anesthesia    slow to wake up  . Cough 2014  . Depression   . Diverticulosis   . Elevated liver enzymes    Biopsy consistent with steatohepatitis   . Fundic gland polyps of stomach, benign   . GERD (gastroesophageal reflux disease) 06/2007   EGD Dr Gala Romney >sm HH, multiple fundic gland polyps  . Glaucoma 2013   both eyes  . Hemorrhoids   . Hiatal hernia   . Hx of radiation therapy 07/17/10 to 07/21/10   SRS LLL lung  . Hyperlipidemia   . Insomnia   . Low back pain    scoliosis  . Lung cancer ([place]) 05/2010   Left 2011 - rad x 3  . Lymphedema    Left arm  . Osteoarthritis   . Osteopenia 10/25/2016  . Osteoporosis, senile   . Overactive bladder   . Pneumonia    RLL with sepsis  . PSVT (paroxysmal supraventricular tachycardia) ([place])   . Rheumatic fever    Age 78  . Right thyroid nodule   . Steatohepatitis    liver bx  . Type 2 diabetes mellitus ([place])    Past Surgical History:  Procedure Laterality Date  . ABDOMINAL HYSTERECTOMY  1964  . APPENDECTOMY  1964  . BIOPSY THYROID  11/2008  . BREAST BIOPSY     X 3 w/ cystectomy  . BREAST LUMPECTOMY  2001  . CATARACT EXTRACTION Bilateral 2003   Lens implant Dr. Charise Killian  . CHOLECYSTECTOMY  1965  . COLONOSCOPY  09/11/2011   Procedure: COLONOSCOPY;  Surgeon: Dorothyann Peng, MD;  [facility];  Service: Endoscopy;  Laterality: N/A;  . COLONOSCOPY  2005 /2012   Dr. Lucianne Muss- diverticulosis, ext hemorrhoids.  . COLONOSCOPY WITH PROPOFOL N/A 11/09/2017   Procedure: COLONOSCOPY WITH PROPOFOL;  Surgeon: Mauri Pole, MD;  Location: [facility];  Service: Endoscopy;  Laterality: N/A;  . COLONOSCOPY WITH PROPOFOL N/A 11/12/2017   Procedure: COLONOSCOPY WITH PROPOFOL;  Surgeon: Ladene Artist, MD;  Location[facility];  Service: Endoscopy;  Laterality: N/A;  . CYSTOSCOPY  2007  . ESOPHAGOGASTRODUODENOSCOPY  09/11/2011   Procedure: ESOPHAGOGASTRODUODENOSCOPY (EGD);  Surgeon: Dorothyann Peng, MD;  [facility];  Service: Endoscopy;  Laterality: N/A;  . ESOPHAGOGASTRODUODENOSCOPY (EGD) WITH PROPOFOL N/A 11/08/2017   Procedure: ESOPHAGOGASTRODUODENOSCOPY (EGD) WITH  PROPOFOL;  Surgeon: Mauri Pole, MD;  [facility];  Service: Endoscopy;  Laterality: N/A;  . LIVER BIOPSY  2008  . LUNG BIOPSY Left 06/06/2010  . REVISION TOTAL HIP ARTHROPLASTY  2007   Left  . SKIN CANCER EXCISION     nose and left lower cheek  . TONSILLECTOMY  1930  . TOTAL HIP ARTHROPLASTY Bilateral 2005 & 2007    Dr. Earl Lagos. Aline Brochure      A IV Location/Drains/Wounds Patient Lines/Drains/Airways Status   Active Line/Drains/Airways    Name:   Placement date:   Placement time:   Site:   Days:   Peripheral IV 02/21/19 Right;Lateral Forearm   02/21/19    1718    Forearm   less than 1   External Urinary Catheter   08/09/18    2234    -   196          Intake/Output Last 24 hours No intake or output data in the 24 hours ending 02/21/19 2033  Labs/Imaging Results for orders placed or performed during the hospital encounter of 02/21/19 (from the past 48 hour(s))  Urinalysis, Routine w reflex microscopic     Status: Abnormal   Collection Time: 02/21/19  4:06 PM  Result Value Ref Range   Color, Urine YELLOW YELLOW   APPearance CLEAR CLEAR   Specific Gravity, Urine 1.013 1.005 - 1.030   pH 6.0 5.0 - 8.0   Glucose, UA NEGATIVE NEGATIVE mg/dL   Hgb urine dipstick NEGATIVE NEGATIVE   Bilirubin Urine NEGATIVE NEGATIVE   Ketones, ur NEGATIVE NEGATIVE mg/dL   Protein, ur 100 (A) NEGATIVE mg/dL   Nitrite NEGATIVE NEGATIVE   Leukocytes,Ua SMALL (A) NEGATIVE   RBC / HPF 6-10 0 - 5 RBC/hpf   WBC, UA 21-50 0 - 5 WBC/hpf   Bacteria, UA MANY (A) NONE SEEN   Squamous Epithelial / LPF 0-5 0 - 5   Mucus PRESENT     Comment: Performed at [facility], [place] [address], [place], [place] [address]  Comprehensive metabolic panel     Status: Abnormal   Collection Time: 02/21/19  5:21 PM  Result Value Ref Range   Sodium 132 (L) 135 - 145 mmol/L   Potassium 3.3 (L) 3.5 - 5.1 mmol/L   Chloride 90 (L) 98 - 111 mmol/L   CO2 30 22 - 32 mmol/L   Glucose, Bld  98 70 - 99 mg/dL   BUN 18 8 - 23 mg/dL   Creatinine, Ser 0.78 0.44 - 1.00 mg/dL   Calcium 8.9 8.9 - 10.3 mg/dL   Total Protein 8.4 (H) 6.5 - 8.1 g/dL   Albumin 3.8 3.5 - 5.0 g/dL   AST 54 (H) 15 - 41 U/L  ALT 39 0 - 44 U/L   Alkaline Phosphatase 161 (H) 38 - 126 U/L   Total Bilirubin 1.7 (H) 0.3 - 1.2 mg/dL   GFR calc non Af Amer >60 >60 mL/min   GFR calc Af Amer >60 >60 mL/min   Anion gap 12 5 - 15    Comment: Performed at [facility], [place] [address] [place], [place] [address]  Lipase, blood     Status: None   Collection Time: 02/21/19  5:21 PM  Result Value Ref Range   Lipase 39 11 - 51 U/L    Comment: Performed at [facility], [place] [address] [place], [place] [address]  Brain natriuretic peptide     Status: Abnormal   Collection Time: 02/21/19  5:21 PM  Result Value Ref Range   B Natriuretic Peptide 2,482.2 (H) 0.0 - 100.0 pg/mL    Comment: Performed at [facility], [place] [address], [place], [place] [address]  Troponin I - Once     Status: Abnormal   Collection Time: 02/21/19  5:21 PM  Result Value Ref Range   Troponin I 0.05 (HH) <0.03 ng/mL    Comment: CRITICAL RESULT CALLED TO, READ BACK BY AND VERIFIED WITH: CLAPP,S RN @1819  ON 02/21/2019 JACKSON,K Performed at [facility], [place] [address], [place], [place] [address]   CBC with Differential     Status: Abnormal   Collection Time: 02/21/19  5:21 PM  Result Value Ref Range   WBC 7.5 4.0 - 10.5 K/uL   RBC 4.01 3.87 - 5.11 MIL/uL   Hemoglobin 12.9 12.0 - 15.0 g/dL   HCT 40.0 36.0 - 46.0 %   MCV 99.8 80.0 - 100.0 fL   MCH 32.2 26.0 - 34.0 pg   MCHC 32.3 30.0 - 36.0 g/dL   RDW 16.6 (H) 11.5 - 15.5 %   Platelets 175 150 - 400 K/uL   nRBC 0.0 0.0 - 0.2 %   Neutrophils Relative % 81 %   Neutro Abs 6.0 1.7 - 7.7 K/uL   Lymphocytes Relative 9 %   Lymphs Abs 0.7 0.7 - 4.0 K/uL   Monocytes Relative 9 %   Monocytes Absolute 0.7 0.1 - 1.0 K/uL    Eosinophils Relative 0 %   Eosinophils Absolute 0.0 0.0 - 0.5 K/uL   Basophils Relative 1 %   Basophils Absolute 0.1 0.0 - 0.1 K/uL   Immature Granulocytes 0 %   Abs Immature Granulocytes 0.02 0.00 - 0.07 K/uL    Comment: Performed at [facility], [place] [address], [place], [place] [address]  Protime-INR     Status: None   Collection Time: 02/21/19  5:21 PM  Result Value Ref Range   Prothrombin Time 14.3 11.4 - 15.2 seconds   INR 1.1 0.8 - 1.2    Comment: (NOTE) INR goal varies based on device and disease states. Performed at [facility], [place] [address], [place], [place] [address]   Magnesium     Status: Abnormal   Collection Time: 02/21/19  5:21 PM  Result Value Ref Range   Magnesium 1.5 (L) 1.7 - 2.4 mg/dL    Comment: Performed at [facility], [place] [address] [place], [place] [address]  Phosphorus     Status: None   Collection Time: 02/21/19  5:21 PM  Result Value Ref Range   Phosphorus 3.0 2.5 - 4.6 mg/dL    Comment: Performed at [facility], [address] [place], [place] [address]  TSH     Status: None   Collection Time: 02/21/19  5:21 PM  Result Value Ref Range   TSH 1.964 0.350 - 4.500 uIU/mL    Comment: Performed by a 3rd Generation assay with a functional sensitivity of <=0.01 uIU/mL. Performed at [facility], [place] [address], [place], [place] [address]   Lactic acid, plasma     Status: None   Collection Time: 02/21/19  5:46 PM  Result Value Ref Range   Lactic Acid, Venous 1.5 0.5 - 1.9 mmol/L    Comment: Performed at [facility], [place] [address], [place], [place] [address]   Dg Chest 1 View  Result Date: 02/21/2019 CLINICAL DATA:  Weakness, shortness of Breath EXAM: CHEST  1 VIEW COMPARISON:  08/11/2018 FINDINGS: Large left pleural effusion, slightly increased since prior study. Cardiomegaly with vascular congestion. Left base atelectasis.  Interstitial prominence throughout the lungs could reflect interstitial edema. IMPRESSION: Cardiomegaly with vascular congestion and probable mild interstitial edema. Large left pleural effusion with left base atelectasis. Electronically Signed   By: Rolm Baptise M.D.   On: 02/21/2019 17:06   Ct Head Wo Contrast  Result Date: 02/21/2019 CLINICAL DATA:  Weakness.  Hypokalemia.  Elevated blood pressure. EXAM: CT HEAD WITHOUT CONTRAST TECHNIQUE: Contiguous axial images were obtained from the base of the skull through the vertex without intravenous contrast. COMPARISON:  August 09, 2017 FINDINGS: Brain: No subdural, epidural, or subarachnoid hemorrhage identified. Cerebellum, brainstem, and basal cisterns are normal. Ventricles and sulci are unchanged and unremarkable. Moderate to severe white matter changes are similar in the interval. No acute cortical ischemia or infarct. No mass effect or midline shift. Vascular: No hyperdense vessel or unexpected calcification. Skull: Normal. Negative for fracture or focal lesion. Sinuses/Orbits: No acute finding. Other: None. IMPRESSION: 1. Chronic white matter changes. No acute intracranial abnormalities noted. Electronically Signed   By: Dorise Bullion III M.D   On: 02/21/2019 17:07    Pending Labs Unresulted Labs (From admission, onward)    Start     Ordered   02/22/19 9528  Basic metabolic panel  Daily,   R     02/21/19 2002   02/22/19 0500  CBC  Daily,   R     02/21/19 2002   02/22/19 0500  Magnesium  Daily,   R     02/21/19 2002   02/22/19 0500  Troponin I - Tomorrow AM 0500  Tomorrow morning,   R     02/21/19 2028   02/21/19 1746  Lactic acid, plasma  Now then every 2 hours,   STAT     02/21/19 1745   02/21/19 1745  Culture, blood (routine x 2)  BLOOD CULTURE X 2,   STAT     02/21/19 1745   02/21/19 1606  Urine culture  ONCE - STAT,   STAT     02/21/19 1606          Vitals/Pain Today's Vitals   02/21/19 1700 02/21/19 1918 02/21/19 1930  02/21/19 2000  BP: (!) 199/77 (!) 197/101 (!) 189/100 (!) 176/81  Pulse: 93  67 74  Resp: (!) 22  15 19   Temp:      TempSrc:      SpO2: 96%  97% 97%  PainSc:        Isolation Precautions No active isolations  Medications Medications  PreserVision AREDS 2 CAPS 1 capsule (has no administration in time range)  pantoprazole (PROTONIX)  EC tablet 40 mg (has no administration in time range)  latanoprost (XALATAN) 0.005 % ophthalmic solution 1 drop (has no administration in time range)  folic acid (FOLVITE) tablet 400 mcg (has no administration in time range)  polyethylene glycol (MIRALAX / GLYCOLAX) packet 17 g (has no administration in time range)  B Complex-B12 TABS 1 tablet (has no administration in time range)  gabapentin (NEURONTIN) capsule 100 mg (has no administration in time range)  DULoxetine (CYMBALTA) DR capsule 30 mg (has no administration in time range)  atenolol (TENORMIN) tablet 25 mg (has no administration in time range)  feeding supplement (BOOST / RESOURCE BREEZE) liquid 1 Container (has no administration in time range)  dorzolamide-timolol (COSOPT) 22.3-6.8 MG/ML ophthalmic solution 1 drop (has no administration in time range)  enoxaparin (LOVENOX) injection 40 mg (has no administration in time range)  sodium chloride flush (NS) 0.9 % injection 3 mL (has no administration in time range)  acetaminophen (TYLENOL) tablet 650 mg (has no administration in time range)  ipratropium-albuterol (DUONEB) 0.5-2.5 (3) MG/3ML nebulizer solution 3 mL (has no administration in time range)  magnesium sulfate IVPB 2 g 50 mL (has no administration in time range)  magnesium oxide (MAG-OX) tablet 400 mg (has no administration in time range)  potassium chloride SA (K-DUR,KLOR-CON) CR tablet 40 mEq (has no administration in time range)  losartan (COZAAR) tablet 50 mg (has no administration in time range)  hydrALAZINE (APRESOLINE) injection 10 mg (has no administration in time range)  ferrous  gluconate (FERGON) tablet 240 mg (has no administration in time range)  furosemide (LASIX) injection 40 mg (40 mg Intravenous Given 02/21/19 1925)  nitroGLYCERIN (NITROGLYN) 2 % ointment 1 inch (1 inch Topical Given 02/21/19 1942)  hydrALAZINE (APRESOLINE) injection 10 mg (10 mg Intravenous Given 02/21/19 1918)  metoprolol tartrate (LOPRESSOR) injection 5 mg (5 mg Intravenous Given 02/21/19 1920)  cefTRIAXone (ROCEPHIN) 1 g in sodium chloride 0.9 % 100 mL IVPB (1 g Intravenous New Bag/Given 02/21/19 1928)    Mobility non-ambulatory High fall risk   Focused Assessments Cardiac Assessment Handoff:    Lab Results  Component Value Date   CKTOTAL 57 07/07/2007   CKMB 2.1 07/07/2007   TROPONINI 0.05 ([place]) 02/21/2019   No results found for: DDIMER Does the Patient currently have chest pain? No     R Recommendations: See Admitting Provider Note  Report given to:   Additional Notes:

## 2019-02-21 NOTE — ED Notes (Signed)
Date and time results received: 02/21/19 1819 (use smartphrase ".now" to insert current time)  Test: Troponin Critical Value: 0.05  Name of Provider Notified: Rica Mote, RN  Orders Received? Or Actions Taken?: Actions Taken: Engineer, structural

## 2019-02-21 NOTE — ED Triage Notes (Signed)
Pt BIB [facility] from [place] with c/o weakness and hypokalemia that reportedly evaluated by the facility. Pt BP is elevated, only complaint is weakness.

## 2019-02-21 NOTE — ED Provider Notes (Signed)
[facility] DEPT Provider Note   CSN: [id number] Arrival date & time: 02/21/19  1502    History   Chief Complaint Chief Complaint  Patient presents with  . Weakness    HPI [name] is a 85 y.o. female.     HPI Patient reports she has gotten increasingly weak over the past 5 days.  She reports that she has been diagnosed with low potassium.  She reports she has been taking potassium with no improvement in weakness.  She is limited in activities at baseline with lower extremity weakness however, at baseline she is able to stand and transfer herself to her motorized wheelchair and go to the dining room to eat.  She reports now she is just too weak to move or do anything.  Patient is not endorsing any localizing pain.  She has not vomited.  No documented fever.  Patient son reports the last time something is happened it seemed to be because of a urinary tract infection and at that time she had disorientation as well.  Patient reports that she feels more short of breath than usual.  She sometimes uses home oxygen in her room but does not require it for all activities.  She denies chest pain.  She wears compression hose for chronic lower extremity edema.  She does not note that her legs are more swollen than what she finds to be typical.  Blood pressures are significantly elevated.  Patient reports she has been having increasing trouble controlling blood pressure for several months now.  She reports she only takes atenolol twice a day which initially was started for irregular heartbeat.  She reports her blood pressure has been increasing but no additional medications have been added. Past Medical History:  Diagnosis Date  . Allergic rhinitis   . Anemia    NOS  . Anxiety   . Biceps tendon rupture    Bilateral  . Breast cancer ([place]) 2001   Left s/p lumpectomy and XRT   . Bronchiectasis ([place]) 2014   Noted on chest x-ray  . Complication of anesthesia    slow  to wake up  . Cough 2014  . Depression   . Diverticulosis   . Elevated liver enzymes    Biopsy consistent with steatohepatitis  . Fundic gland polyps of stomach, benign   . GERD (gastroesophageal reflux disease) 06/2007   EGD Dr Gala Romney >sm HH, multiple fundic gland polyps  . Glaucoma 2013   both eyes  . Hemorrhoids   . Hiatal hernia   . Hx of radiation therapy 07/17/10 to 07/21/10   SRS LLL lung  . Hyperlipidemia   . Insomnia   . Low back pain    scoliosis  . Lung cancer ([place]) 05/2010   Left 2011 - rad x 3  . Lymphedema    Left arm  . Osteoarthritis   . Osteopenia 10/25/2016  . Osteoporosis, senile   . Overactive bladder   . Pneumonia    RLL with sepsis  . PSVT (paroxysmal supraventricular tachycardia) ([place])   . Rheumatic fever    Age 35  . Right thyroid nodule   . Steatohepatitis    liver bx  . Type 2 diabetes mellitus [facility])     Patient Active Problem List   Diagnosis Date Noted  . Amaurosis fugax of right eye 01/15/2019  . Vaginal discharge 01/14/2019  . Glaucoma 01/14/2019  . Macular degeneration 01/14/2019  . Chronic diastolic CHF (congestive heart failure) ([place]) 08/13/2018  .  Hypertensive urgency 08/09/2018  . Pulmonary hypertension, primary ([place]) 07/31/2018  . Congestive heart failure (CHF) ([place]) 07/24/2018  . Yeast vaginitis 07/23/2018  . Chronic respiratory failure with hypoxia ([place]) 06/20/2018  . Neuropathy 06/20/2018  . Paroxysmal atrial fibrillation ([place]) 06/20/2018  . Hypokalemia 03/26/2018  . Bloated abdomen 03/21/2018  . Post-nasal drip 01/21/2018  . Iron deficiency 01/21/2018  . Fall 01/09/2018  . Coccyalgia 01/09/2018  . History of GI diverticular bleed 12/25/2017  . Protein-calorie malnutrition ([place]) 12/05/2017  . Advanced care planning/counseling discussion 11/20/2017  . Unsteady gait 11/19/2017  . TSH elevation 11/19/2017  . Permanent atrial fibrillation 11/19/2017  . Melena   . Diverticulosis of colon with hemorrhage   . Hematochezia  11/03/2017  . Decrease in appetite 04/03/2017  . Generalized weakness 04/03/2017  . Osteopenia 10/25/2016  . Hyponatremia 07/09/2015  . Hearing loss of both ears 04/08/2014  . HTN (hypertension) 03/27/2014  . Constipation 03/27/2014  . Skin lesion of face 03/27/2014  . Bronchiectasis ([place])   . Rotator cuff syndrome of right shoulder 09/30/2012  . Breast cancer ([place])   . Hx of radiation therapy   . Lung cancer, lower lobe ([place]) 10/25/2011  . PSVT (paroxysmal supraventricular tachycardia) ([place]) 03/29/2011  . HIP PAIN 05/03/2010  . TOTAL HIP FOLLOW-UP 05/03/2010  . INTERDIGITAL NEUROMA 03/28/2010  . IMPINGEMENT SYNDROME 03/28/2010  . THYROID NODULE, RIGHT 09/06/2008  . HIP, ARTHRITIS, DEGEN./OSTEO 05/03/2008  . LEG EDEMA, BILATERAL 12/22/2007  . PALPITATIONS, RECURRENT 08/27/2007  . Depression with anxiety 07/31/2007  . WEAKNESS, MUSCLE 05/28/2007  . Other specified disorders of adrenal gland ([place]) 05/14/2007  . SOB (shortness of breath) 05/06/2007  . Fatigue 05/01/2007  . Hyperlipidemia 01/28/2007  . Blood loss anemia 01/28/2007  . Allergic rhinitis 01/28/2007  . GERD (gastroesophageal reflux disease) 01/28/2007  . OVERACTIVE BLADDER 01/28/2007  . Osteoarthritis 01/28/2007  . Pain in lower back 01/28/2007  . Osteoporosis 01/28/2007    Past Surgical History:  Procedure Laterality Date  . ABDOMINAL HYSTERECTOMY  1964  . APPENDECTOMY  1964  . BIOPSY THYROID  11/2008  . BREAST BIOPSY     X 3 w/ cystectomy  . BREAST LUMPECTOMY  2001  . CATARACT EXTRACTION Bilateral 2003   Lens implant Dr. Charise Killian  . CHOLECYSTECTOMY  1965  . COLONOSCOPY  09/11/2011   Procedure: COLONOSCOPY;  Surgeon: Dorothyann Peng, MD;  Location: [facility];  Service: Endoscopy;  Laterality: N/A;  . COLONOSCOPY  2005 /2012   Dr. Lucianne Muss- diverticulosis, ext hemorrhoids.  . COLONOSCOPY WITH PROPOFOL N/A 11/09/2017   Procedure: COLONOSCOPY WITH PROPOFOL;  Surgeon: Mauri Pole, MD;  Location: [facility]  ENDOSCOPY;  Service: Endoscopy;  Laterality: N/A;  . COLONOSCOPY WITH PROPOFOL N/A 11/12/2017   Procedure: COLONOSCOPY WITH PROPOFOL;  Surgeon: Ladene Artist, MD;  Location: [facility] ENDOSCOPY;  Service: Endoscopy;  Laterality: N/A;  . CYSTOSCOPY  2007  . ESOPHAGOGASTRODUODENOSCOPY  09/11/2011   Procedure: ESOPHAGOGASTRODUODENOSCOPY (EGD);  Surgeon: Dorothyann Peng, MD;  Location: [facility] END[facility] SU[facility];  Service: Endoscopy;  Laterality: N/A;  . ESOPHAGOGASTRODUODENOSCOPY (EGD) WITH PROPOFOL N/A 11/08/2017   Procedure: ESOPHAGOGASTRODUODENOSCOPY (EGD) WITH PROPOFOL;  Surgeon: Mauri Pole, MD;  Location: [facility] ENDOSCOPY;  Service: Endoscopy;  Laterality: N/A;  . LIVER BIOPSY  2008  . LUNG BIOPSY Left 06/06/2010  . REVISION TOTAL HIP ARTHROPLASTY  2007   Left  . SKIN CANCER EXCISION     nose and left lower cheek  . TONSILLECTOMY  1930  . TOTAL HIP ARTHROPLASTY Bilateral 2005 &  2007    Dr. Earl Lagos. Aline Brochure      OB History   No obstetric history on file.      Home Medications    Prior to Admission medications   Medication Sig Start Date End Date Taking? Authorizing Provider  acetaminophen (TYLENOL) 500 MG tablet Take 1,000 mg by mouth 2 (two) times daily.    [provider]  acetaminophen (TYLENOL) 500 MG tablet Take 500 mg by mouth daily.    [provider]  atenolol (TENORMIN) 25 MG tablet Take 1 tablet (25 mg total) by mouth 2 (two) times daily. 08/11/18   Georgette Shell, MD  B Complex Vitamins (B COMPLEX-B12) TABS Take 1 tablet by mouth daily.    [provider]  dorzolamide-timolol (COSOPT) 22.3-6.8 MG/ML ophthalmic solution Place 1 drop into the left eye 2 (two) times daily.    [provider]  DULoxetine (CYMBALTA) 30 MG capsule Take 30 mg by mouth daily. 07/29/18   [provider]  feeding supplement (BOOST / RESOURCE BREEZE) LIQD Take 237 mLs by mouth 2 (two) times daily.    [provider]  ferrous gluconate (FERGON) 240  (27 FE) MG tablet Take 240 mg by mouth daily.    [provider]  folic acid (FOLVITE) 409 MCG tablet Take 400 mcg by mouth daily.    [provider]  gabapentin (NEURONTIN) 100 MG capsule Take 100 mg by mouth 2 (two) times daily.    [provider]  ipratropium-albuterol (DUONEB) 0.5-2.5 (3) MG/3ML SOLN Take 3 mLs by nebulization 2 (two) times daily. chronic respiratory failure    [provider]  latanoprost (XALATAN) 0.005 % ophthalmic solution Place 1 drop into both eyes at bedtime.    [provider]  lidocaine (LIDODERM) 5 % Place 1 patch onto the skin daily as needed. Remove & Discard patch within 12 hours or as directed by MD     [provider]  Menthol, Topical Analgesic, (BIOFREEZE) 4 % GEL Apply 1 application topically every 8 (eight) hours. Apply to both knees    [provider]  Multiple Vitamins-Minerals (PRESERVISION AREDS 2 PO) Take 1 capsule by mouth 2 (two) times daily.     [provider]  omeprazole (PRILOSEC) 20 MG capsule TAKE 1 CAPSULE EVERY DAY. 07/05/17   Blanchie Serve, MD  polyethylene glycol (MIRALAX / GLYCOLAX) packet Take 17 g by mouth daily as needed.     [provider]  potassium chloride SA (K-DUR,KLOR-CON) 20 MEQ tablet Take 40 mEq by mouth daily.  02/18/19   [provider]  Simethicone 80 MG TABS Take 1 tablet by mouth 4 (four) times daily as needed.    [provider]  sodium chloride 1 g tablet Take 1 g by mouth daily.    [provider]  Sodium Fluoride (PREVIDENT 5000 BOOSTER PLUS) 1.1 % PSTE Place onto teeth at bedtime.    [provider]    Family History Family History  Problem Relation Age of Onset  . Heart failure Mother   . Osteoporosis Mother   . Hypertension Father   . Stroke Father   . Heart failure Father   . Leukemia Brother   . Heart failure Brother   . Cancer Maternal Grandmother   . Stroke Maternal Grandfather   . Cancer  Other     Social History Social History   Tobacco Use  . Smoking status: Never Smoker  . Smokeless tobacco: Never Used  . Tobacco  comment: 2nd hand-husband  Substance Use Topics  . Alcohol use: No  . Drug use: No     Allergies   Augmentin [amoxicillin-pot clavulanate]; Ibuprofen; Naproxen; Statins; Surgilube [gyne-moistrin]; and Tape   Review of Systems Review of Systems 10 Systems reviewed and are negative for acute change except as noted in the HPI.   Physical Exam Updated Vital Signs BP (!) 197/101   Pulse 93   Temp 97.7 F (36.5 C) (Oral)   Resp (!) 22   SpO2 96%   Physical Exam Constitutional:      Comments: Patient is alert and interactive.  Nontoxic.  Mild increased work of breathing at rest.  Color good.  HENT:     Head: Normocephalic and atraumatic.     Right Ear: Tympanic membrane normal.     Left Ear: Tympanic membrane normal.     Nose: Nose normal.     Mouth/Throat:     Comments: Very mild white plaque on tongue.  Posterior oropharynx widely patent. Eyes:     Extraocular Movements: Extraocular movements intact.     Pupils: Pupils are equal, round, and reactive to light.  Cardiovascular:     Rate and Rhythm: Normal rate and regular rhythm.     Comments: Occasional ectopic beats.  No gross rub murmur gallop. Pulmonary:     Comments: Mild increased work of breathing at rest.  Crackles to mid lung fields. Abdominal:     General: There is no distension.     Palpations: Abdomen is soft.     Tenderness: There is no abdominal tenderness. There is no guarding.  Musculoskeletal:     Comments: Bilateral lower extremities 2+ edema symmetric.  No wounds or lesions to the feet or lower legs.  No erythema to suggest cellulitis.  Skin:    General: Skin is warm and dry.  Neurological:     Comments: Patient is alert and appropriate.  She is giving history.  Speech is clear.  She can use both upper extremities symmetrically to pull herself up to sitting upright  in the stretcher.  She can move both lower extremities but reports chronic weakness.  Psychiatric:        Mood and Affect: Mood normal.      ED Treatments / Results  Labs (all labs ordered are listed, but only abnormal results are displayed) Labs Reviewed  COMPREHENSIVE METABOLIC PANEL - Abnormal; Notable for the following components:      Result Value   Sodium 132 (*)    Potassium 3.3 (*)    Chloride 90 (*)    Total Protein 8.4 (*)    AST 54 (*)    Alkaline Phosphatase 161 (*)    Total Bilirubin 1.7 (*)    All other components within normal limits  BRAIN NATRIURETIC PEPTIDE - Abnormal; Notable for the following components:   B Natriuretic Peptide 2,482.2 (*)    All other components within normal limits  TROPONIN I - Abnormal; Notable for the following components:   Troponin I 0.05 (*)    All other components within normal limits  CBC WITH DIFFERENTIAL/PLATELET - Abnormal; Notable for the following components:   RDW 16.6 (*)    All other components within normal limits  URINALYSIS, ROUTINE W REFLEX MICROSCOPIC - Abnormal; Notable for the following components:   Protein, ur 100 (*)    Leukocytes,Ua SMALL (*)    Bacteria, UA MANY (*)    All other components within normal limits  MAGNESIUM - Abnormal; Notable for  the following components:   Magnesium 1.5 (*)    All other components within normal limits  URINE CULTURE  CULTURE, BLOOD (ROUTINE X 2)  CULTURE, BLOOD (ROUTINE X 2)  LIPASE, BLOOD  PROTIME-INR  PHOSPHORUS  TSH  LACTIC ACID, PLASMA  LACTIC ACID, PLASMA    EKG EKG Interpretation  Date/Time:  Saturday February 21 2019 15:28:06 EST Ventricular Rate:  87 PR Interval:    QRS Duration: 79 QT Interval:  433 QTC Calculation: 521 R Axis:   104 Text Interpretation:  Sinus rhythm Atrial premature complex Right axis deviation Nonspecific T abnrm, anterolateral leads Prolonged QT interval agree. no acute ischemic change compared to previous Confirmed by Charlesetta Shanks [phone]) on 02/21/2019 7:30:23 PM   Radiology Dg Chest 1 View  Result Date: 02/21/2019 CLINICAL DATA:  Weakness, shortness of Breath EXAM: CHEST  1 VIEW COMPARISON:  08/11/2018 FINDINGS: Large left pleural effusion, slightly increased since prior study. Cardiomegaly with vascular congestion. Left base atelectasis. Interstitial prominence throughout the lungs could reflect interstitial edema. IMPRESSION: Cardiomegaly with vascular congestion and probable mild interstitial edema. Large left pleural effusion with left base atelectasis. Electronically Signed   By: Rolm Baptise M.D.   On: 02/21/2019 17:06   Ct Head Wo Contrast  Result Date: 02/21/2019 CLINICAL DATA:  Weakness.  Hypokalemia.  Elevated blood pressure. EXAM: CT HEAD WITHOUT CONTRAST TECHNIQUE: Contiguous axial images were obtained from the base of the skull through the vertex without intravenous contrast. COMPARISON:  August 09, 2017 FINDINGS: Brain: No subdural, epidural, or subarachnoid hemorrhage identified. Cerebellum, brainstem, and basal cisterns are normal. Ventricles and sulci are unchanged and unremarkable. Moderate to severe white matter changes are similar in the interval. No acute cortical ischemia or infarct. No mass effect or midline shift. Vascular: No hyperdense vessel or unexpected calcification. Skull: Normal. Negative for fracture or focal lesion. Sinuses/Orbits: No acute finding. Other: None. IMPRESSION: 1. Chronic white matter changes. No acute intracranial abnormalities noted. Electronically Signed   By: Dorise Bullion III M.D   On: 02/21/2019 17:07    Procedures Procedures (including critical care time) CRITICAL CARE Performed by: Charlesetta Shanks   Total critical care time:30 minutes  Critical care time was exclusive of separately billable procedures and treating other patients.  Critical care was necessary to treat or prevent imminent or life-threatening deterioration.  Critical care was time spent  personally by me on the following activities: development of treatment plan with patient and/or surrogate as well as nursing, discussions with consultants, evaluation of patient's response to treatment, examination of patient, obtaining history from patient or surrogate, ordering and performing treatments and interventions, ordering and review of laboratory studies, ordering and review of radiographic studies, pulse oximetry and re-evaluation of patient's condition. Medications Ordered in ED Medications  nitroGLYCERIN (NITROGLYN) 2 % ointment 1 inch (has no administration in time range)  cefTRIAXone (ROCEPHIN) 1 g in sodium chloride 0.9 % 100 mL IVPB (1 g Intravenous New Bag/Given 02/21/19 1928)  furosemide (LASIX) injection 40 mg (40 mg Intravenous Given 02/21/19 1925)  hydrALAZINE (APRESOLINE) injection 10 mg (10 mg Intravenous Given 02/21/19 1918)  metoprolol tartrate (LOPRESSOR) injection 5 mg (5 mg Intravenous Given 02/21/19 1920)     Initial Impression / Assessment and Plan / ED Course  I have reviewed the triage vital signs and the nursing notes.  Pertinent labs & imaging results that were available during my care of the patient were reviewed by me and considered in my medical decision making (see chart for details).  Consult: Tried hospitalist for admission  Patient presents with chief complaint of significantly increased generalized weakness and shortness of breath.  Patient has findings suggestive of urinary tract infection.  Family members report similar event last fall.  Patient also has shortness of breath with findings of CHF.  Patient is significantly hypertensive.  Treatment initiated for UTI, hypertensive urgency and volume overload.  Patient is alert and appropriate.  She is on 2 L of nasal cannula oxygen with only minimal increased work of breathing.  Patient will be admitted for further management.  Final Clinical Impressions(s) / ED Diagnoses   Final diagnoses:    Generalized weakness  Acute cystitis with hematuria  Acute congestive heart failure, unspecified heart failure type [facility])  Hypertensive urgency    ED Discharge Orders    None       Charlesetta Shanks, MD 02/21/19 1932

## 2019-02-22 ENCOUNTER — Inpatient Hospital Stay (HOSPITAL_COMMUNITY): Payer: Medicare HMO

## 2019-02-22 DIAGNOSIS — I340 Nonrheumatic mitral (valve) insufficiency: Secondary | ICD-10-CM

## 2019-02-22 DIAGNOSIS — R531 Weakness: Secondary | ICD-10-CM

## 2019-02-22 DIAGNOSIS — I361 Nonrheumatic tricuspid (valve) insufficiency: Secondary | ICD-10-CM

## 2019-02-22 DIAGNOSIS — I160 Hypertensive urgency: Secondary | ICD-10-CM

## 2019-02-22 LAB — BASIC METABOLIC PANEL
Anion gap: 13 (ref 5–15)
BUN: 19 mg/dL (ref 8–23)
CO2: 34 mmol/L — ABNORMAL HIGH (ref 22–32)
Calcium: 8.7 mg/dL — ABNORMAL LOW (ref 8.9–10.3)
Chloride: 87 mmol/L — ABNORMAL LOW (ref 98–111)
Creatinine, Ser: 0.81 mg/dL (ref 0.44–1.00)
GFR calc Af Amer: >60 mL/min (ref >60)
GFR calc non Af Amer: >60 mL/min (ref >60)
Glucose, Bld: 97 mg/dL (ref 70–99)
Potassium: 3.2 mmol/L — ABNORMAL LOW (ref 3.5–5.1)
Sodium: 134 mmol/L — ABNORMAL LOW (ref 135–145)

## 2019-02-22 LAB — MAGNESIUM: Magnesium: 1.9 mg/dL (ref 1.7–2.4)

## 2019-02-22 LAB — ECHOCARDIOGRAM COMPLETE
Height: 63 in
Weight: 2306.89 oz

## 2019-02-22 LAB — TROPONIN I
Troponin I: 0.1 ng/mL (ref <0.03)
Troponin I: 0.12 ng/mL (ref <0.03)

## 2019-02-22 LAB — MRSA PCR SCREENING: MRSA by PCR: NEGATIVE

## 2019-02-22 LAB — CBC
HCT: 36.1 % (ref 36.0–46.0)
Hemoglobin: 11.7 g/dL — ABNORMAL LOW (ref 12.0–15.0)
MCH: 32.1 pg (ref 26.0–34.0)
MCHC: 32.4 g/dL (ref 30.0–36.0)
MCV: 99.2 fL (ref 80.0–100.0)
Platelets: 184 10*3/uL (ref 150–400)
RBC: 3.64 MIL/uL — ABNORMAL LOW (ref 3.87–5.11)
RDW: 16.7 % — ABNORMAL HIGH (ref 11.5–15.5)
WBC: 8.9 10*3/uL (ref 4.0–10.5)
nRBC: 0 % (ref 0.0–0.2)

## 2019-02-22 LAB — LACTIC ACID, PLASMA: Lactic Acid, Venous: 2.3 mmol/L (ref 0.5–1.9)

## 2019-02-22 MED ORDER — FUROSEMIDE 10 MG/ML IJ SOLN
40.0000 mg | Freq: Once | INTRAMUSCULAR | Status: AC
  Administered 2019-02-22: 40 mg via INTRAVENOUS
  Filled 2019-02-22: qty 4

## 2019-02-22 MED ORDER — POTASSIUM CHLORIDE CRYS ER 20 MEQ PO TBCR
40.0000 meq | EXTENDED_RELEASE_TABLET | Freq: Two times a day (BID) | ORAL | Status: DC
  Administered 2019-02-22 – 2019-02-25 (×8): 40 meq via ORAL
  Filled 2019-02-22 (×8): qty 2

## 2019-02-22 MED ORDER — FUROSEMIDE 10 MG/ML IJ SOLN: 40.0000 mg | Freq: Once | INTRAMUSCULAR | Status: DC

## 2019-02-22 NOTE — NC FL2 (Signed)
[place] LEVEL OF CARE SCREENING TOOL     IDENTIFICATION  Patient Name: [name] Birthdate: 1921/04/23 Sex: female Admission Date (Current Location): 02/21/2019  [facility] and [place] Number:  Herbalist and Address:  [facility],  [place] [address] [place]      Provider Number: [id number]  Attending Physician Name and Address:  Caren Griffins, MD  Relative Name and Phone Number:       Current Level of Care: Hospital Recommended Level of Care: [place] Prior Approval Number:    Date Approved/Denied:   PASRR Number: [id number]  Discharge Plan: SNF    Current Diagnoses: Patient Active Problem List   Diagnosis Date Noted  . Acute decompensated heart failure ([place]) 02/21/2019  . Amaurosis fugax of right eye 01/15/2019  . Vaginal discharge 01/14/2019  . Glaucoma 01/14/2019  . Macular degeneration 01/14/2019  . Chronic diastolic CHF (congestive heart failure) ([place]) 08/13/2018  . Hypertensive urgency 08/09/2018  . Pulmonary hypertension, primary ([place]) 07/31/2018  . Congestive heart failure (CHF) ([place]) 07/24/2018  . Yeast vaginitis 07/23/2018  . Chronic respiratory failure with hypoxia ([place]) 06/20/2018  . Neuropathy 06/20/2018  . Paroxysmal atrial fibrillation ([place]) 06/20/2018  . Hypokalemia 03/26/2018  . Bloated abdomen 03/21/2018  . Post-nasal drip 01/21/2018  . Iron deficiency 01/21/2018  . Fall 01/09/2018  . Coccyalgia 01/09/2018  . History of GI diverticular bleed 12/25/2017  . Protein-calorie malnutrition ([place]) 12/05/2017  . Advanced care planning/counseling discussion 11/20/2017  . Unsteady gait 11/19/2017  . TSH elevation 11/19/2017  . Permanent atrial fibrillation 11/19/2017  . Melena   . Diverticulosis of colon with hemorrhage   . Hematochezia 11/03/2017  . Decrease in appetite 04/03/2017  . Generalized weakness 04/03/2017  . Osteopenia 10/25/2016  . Hyponatremia 07/09/2015  . Hearing  loss of both ears 04/08/2014  . HTN (hypertension) 03/27/2014  . Constipation 03/27/2014  . Skin lesion of face 03/27/2014  . Bronchiectasis ([place])   . Rotator cuff syndrome of right shoulder 09/30/2012  . Breast cancer ([place])   . Hx of radiation therapy   . Lung cancer, lower lobe ([place]) 10/25/2011  . PSVT (paroxysmal supraventricular tachycardia) ([place]) 03/29/2011  . HIP PAIN 05/03/2010  . TOTAL HIP FOLLOW-UP 05/03/2010  . INTERDIGITAL NEUROMA 03/28/2010  . IMPINGEMENT SYNDROME 03/28/2010  . THYROID NODULE, RIGHT 09/06/2008  . HIP, ARTHRITIS, DEGEN./OSTEO 05/03/2008  . LEG EDEMA, BILATERAL 12/22/2007  . PALPITATIONS, RECURRENT 08/27/2007  . Depression with anxiety 07/31/2007  . WEAKNESS, MUSCLE 05/28/2007  . Other specified disorders of adrenal gland ([place]) 05/14/2007  . SOB (shortness of breath) 05/06/2007  . Fatigue 05/01/2007  . Hyperlipidemia 01/28/2007  . Blood loss anemia 01/28/2007  . Allergic rhinitis 01/28/2007  . GERD (gastroesophageal reflux disease) 01/28/2007  . OVERACTIVE BLADDER 01/28/2007  . Osteoarthritis 01/28/2007  . Pain in lower back 01/28/2007  . Osteoporosis 01/28/2007    Orientation RESPIRATION BLADDER Height & Weight     Self, Time, Situation, Place  (2L) Incontinent Weight: 144 lb 2.9 oz (65.4 kg) Height:  5' 3"  (160 cm)  BEHAVIORAL SYMPTOMS/MOOD NEUROLOGICAL BOWEL NUTRITION STATUS      Continent Diet(heart healthy)  AMBULATORY STATUS COMMUNICATION OF NEEDS Skin   Extensive Assist Verbally Normal                       Personal Care Assistance Level of Assistance  Bathing, Feeding, Dressing Bathing Assistance: Maximum assistance Feeding assistance: Independent Dressing Assistance: Limited assistance  Functional Limitations Info  Sight, Hearing, Speech Sight Info: Adequate(glaucoma both eyes) Hearing Info: Adequate Speech Info: Adequate    SPECIAL CARE FACTORS FREQUENCY  PT (By licensed PT), OT (By licensed OT)     PT Frequency:  5x OT Frequency: 5x            Contractures Contractures Info: Not present    Additional Factors Info  Code Status, Allergies Code Status Info: DNR Allergies Info: Augmentin Amoxicillin-pot Clavulanate, Ibuprofen, Naproxen, Statins, Surgilube Gyne-moistrin, Tape           Current Medications (02/22/2019):  This is the current hospital active medication list Current Facility-Administered Medications  Medication Dose Route Frequency Provider Last Rate Last Dose  . acetaminophen (TYLENOL) tablet 650 mg  650 mg Oral Q6H PRN Colbert Ewing, MD   650 mg at 02/22/19 1696  . atenolol (TENORMIN) tablet 25 mg  25 mg Oral BID Colbert Ewing, MD   25 mg at 02/22/19 1043  . B-complex with vitamin C tablet 1 tablet  1 tablet Oral Daily Park, Derenda Mis, MD   1 tablet at 02/22/19 1043  . cefdinir (OMNICEF) capsule 300 mg  300 mg Oral Q12H Park, Derenda Mis, MD   300 mg at 02/22/19 1043  . dorzolamide-timolol (COSOPT) 22.3-6.8 MG/ML ophthalmic solution 1 drop  1 drop Left Eye BID Park, Derenda Mis, MD      . DULoxetine (CYMBALTA) DR capsule 30 mg  30 mg Oral Daily Park, Derenda Mis, MD   30 mg at 02/22/19 1043  . enoxaparin (LOVENOX) injection 40 mg  40 mg Subcutaneous Q24H Colbert Ewing, MD   40 mg at 02/22/19 1045  . feeding supplement (BOOST / RESOURCE BREEZE) liquid 1 Container  237 mL Oral Q lunch Park, Derenda Mis, MD      . ferrous gluconate [facility]) tablet 324 mg  324 mg Oral Q breakfast Colbert Ewing, MD   324 mg at 02/22/19 7893  . folic acid (FOLVITE) tablet 0.5 mg  0.5 mg Oral Daily Park, Derenda Mis, MD   0.5 mg at 02/22/19 1044  . gabapentin (NEURONTIN) capsule 100 mg  100 mg Oral BID Colbert Ewing, MD   100 mg at 02/22/19 1043  . hydrALAZINE (APRESOLINE) injection 10 mg  10 mg Intravenous Q6H PRN Colbert Ewing, MD   10 mg at 02/22/19 1133  . ipratropium-albuterol (DUONEB) 0.5-2.5 (3) MG/3ML nebulizer solution 3 mL  3 mL Nebulization Q6H PRN Park, Derenda Mis, MD   3 mL at 02/21/19 2132   . latanoprost (XALATAN) 0.005 % ophthalmic solution 1 drop  1 drop Both Eyes QHS Park, Derenda Mis, MD   1 drop at 02/21/19 2319  . losartan (COZAAR) tablet 50 mg  50 mg Oral Daily Park, Derenda Mis, MD   50 mg at 02/22/19 1043  . magnesium oxide (MAG-OX) tablet 400 mg  400 mg Oral BID Colbert Ewing, MD   400 mg at 02/22/19 1043  . multivitamin (PROSIGHT) tablet 1 tablet  1 tablet Oral Daily Park, Derenda Mis, MD   1 tablet at 02/22/19 1043  . pantoprazole (PROTONIX) EC tablet 40 mg  40 mg Oral Daily Park, Derenda Mis, MD   40 mg at 02/22/19 1043  . polyethylene glycol (MIRALAX / GLYCOLAX) packet 17 g  17 g Oral Daily PRN Park, Derenda Mis, MD      . potassium chloride SA (K-DUR,KLOR-CON) CR tablet 40 mEq  40 mEq Oral BID Park, Derenda Mis,  MD   40 mEq at 02/22/19 0830  . sodium chloride flush (NS) 0.9 % injection 3 mL  3 mL Intravenous Q12H Park, Derenda Mis, MD   3 mL at 02/22/19 8921     Discharge Medications: Please see discharge summary for a list of discharge medications.  Relevant Imaging Results:  Relevant Lab Results:   Additional Information [id number]  Nila Nephew, LCSW

## 2019-02-22 NOTE — Evaluation (Signed)
Physical Therapy Evaluation Patient Details Name: [name] MRN: [id number] DOB: Jul 26, 1921 Today's Date: 02/22/2019   History of Present Illness  85 year old female with history of diastolic CHF, hypertension, diet-controlled diabetes mellitus, history of PSVT, history of breast cancer, chronic hypoxic respiratory failure on home O2 was admitted to the hospital on 3/7 with 2 weeks of generalized progressive weakness and increased dyspnea on exertion.  She was found to be significantly fluid overloaded as well as chest x-ray in the ED showed large left-sided pleural effusion.  She was admitted for CHF exacerbation.  Clinical Impression  Pt admitted with above diagnosis. Pt currently with functional limitations due to the deficits listed below (see PT Problem List).  Pt with significant deconditioning, at baseline is able to transfer independently; will benefit from SNF post acute; PT will follow in acute setting Pt will benefit from skilled PT to increase their independence and safety with mobility to allow discharge to the venue listed below.       Follow Up Recommendations SNF    Equipment Recommendations  None recommended by PT    Recommendations for Other Services       Precautions / Restrictions Precautions Precautions: Fall      Mobility  Bed Mobility Overal bed mobility: Needs Assistance Bed Mobility: Supine to Sit;Sit to Supine     Supine to sit: Min assist Sit to supine: Mod assist;Max assist   General bed mobility comments: use of rail and trunk assist to come to sit; pt required assist to control descent of trunk and to bring LEs into bed; RR 16 to 23 during mobility  Transfers                 General transfer comment: pt declined to attempt bed to chair  Ambulation/Gait                Stairs            Wheelchair Mobility    Modified Rankin (Stroke Patients Only)       Balance Overall balance assessment: Needs  assistance Sitting-balance support: Feet supported;Bilateral upper extremity supported;Single extremity supported Sitting balance-Leahy Scale: Fair         Standing balance comment: NT d/t fatigue                             Pertinent Vitals/Pain Pain Assessment: No/denies pain    Home Living Family/patient expects to be discharged to:: Assisted living                 Additional Comments: pt from [facility]    Prior Function     Gait / Transfers Assistance Needed: pt able to transfer at baseline bed to w/c without assist per pt and son           Hand Dominance        Extremity/Trunk Assessment   Upper Extremity Assessment Upper Extremity Assessment: Generalized weakness    Lower Extremity Assessment Lower Extremity Assessment: Generalized weakness       Communication   Communication: No difficulties  Cognition Arousal/Alertness: Awake/alert Behavior During Therapy: WFL for tasks assessed/performed Overall Cognitive Status: Within Functional Limits for tasks assessed                                        General Comments  Exercises     Assessment/Plan    PT Assessment Patient needs continued PT services  PT Problem List Decreased strength;Decreased activity tolerance;Cardiopulmonary status limiting activity;Decreased mobility;Decreased balance       PT Treatment Interventions DME instruction;Gait training;Functional mobility training;Therapeutic activities;Therapeutic exercise;Balance training;Patient/family education    PT Goals (Current goals can be found in the Care Plan section)  Acute Rehab PT Goals Patient Stated Goal: be able to breath better PT Goal Formulation: With patient Time For Goal Achievement: 03/08/19 Potential to Achieve Goals: Fair    Frequency Min 2X/week   Barriers to discharge        Co-evaluation               AM-PAC PT "6 Clicks" Mobility  Outcome Measure Help needed turning  from your back to your side while in a flat bed without using bedrails?: A Lot Help needed moving from lying on your back to sitting on the side of a flat bed without using bedrails?: A Lot Help needed moving to and from a bed to a chair (including a wheelchair)?: A Lot Help needed standing up from a chair using your arms (e.g., wheelchair or bedside chair)?: A Lot Help needed to walk in hospital room?: Total Help needed climbing 3-5 steps with a railing? : Total 6 Click Score: 10    End of Session   Activity Tolerance: Patient limited by fatigue Patient left: in bed;with call bell/phone within reach;with bed alarm set;with family/visitor present   PT Visit Diagnosis: Muscle weakness (generalized) (M62.81)    Time: 2841-3244 PT Time Calculation (min) (ACUTE ONLY): 18 min   Charges:   PT Evaluation $PT Eval Low Complexity: 1 Low          Kenyon Ana, PT  Pager: [phone] Acute Rehab Dept [facility]): [phone]   02/22/2019   [facility] 02/22/2019, 3:15 PM

## 2019-02-22 NOTE — Evaluation (Signed)
Occupational Therapy Evaluation Patient Details Name: [name] MRN: [id number] DOB: 04-Feb-1921 Today's Date: 02/22/2019    History of Present Illness 85 year old female with history of diastolic CHF, hypertension, diet-controlled diabetes mellitus, history of PSVT, history of breast cancer, chronic hypoxic respiratory failure on home O2 was admitted to the hospital on 3/7 with 2 weeks of generalized progressive weakness and increased dyspnea on exertion.  She was found to be significantly fluid overloaded as well as chest x-ray in the ED showed large left-sided pleural effusion.  She was admitted for CHF exacerbation.   Clinical Impression   PATIENT WAS AGREEABLE TO SITTING EOB WITH ENCOURAGEMENT. PATIENT REFUSED TO TRANSFER TO CHAIR. PATIENT WAS MOD A WITH SUPINE TO SIT AND SIT TO SUPINE. PATIENT REFUSED TO ATTEMPT TO STAND SECONDARY TO SOB. PATIENT O2 SATS IN LAYING IN BED WITH HEP ELEVATED 99% AND SITTING EOB WAS 98%. PATIENT WAS INFORMED OF O2 SATS BUT AGAIN REFUSED TO GET OUT OF BED. PATIENT STATES SHE HAS NEEDED MORE ASSIST AT ALF FOR THE PAST FEW WEEKS. PATIENT WOULD BENEFIT FROM ST SNF TO RETURN TO BASELINE.     Follow Up Recommendations  SNF    Equipment Recommendations  None recommended by OT    Recommendations for Other Services       Precautions / Restrictions Precautions Precautions: Fall Precaution Comments: check o2 sats      Mobility Bed Mobility Overal bed mobility: Needs Assistance Bed Mobility: Supine to Sit;Sit to Supine     Supine to sit: Mod assist Sit to supine: Mod assist      Transfers                      Balance                                           ADL either performed or assessed with clinical judgement   ADL Overall ADL's : Needs assistance/impaired Eating/Feeding: Set up   Grooming: Wash/dry hands;Wash/dry face;Brushing hair;Minimal assistance   Upper Body Bathing: Minimal assistance   Lower Body  Bathing: Maximal assistance;Bed level   Upper Body Dressing : Minimal assistance;Sitting   Lower Body Dressing: Total assistance;Bed level               Functional mobility during ADLs: (Mod a with supine to sit and sit to supine.) General ADL Comments: Patient was agreeable to eob but not to get oob. Patiet is requirting assist with ADLs and mobility currently.      Vision Baseline Vision/History: Wears glasses Wears Glasses: At all times Patient Visual Report: No change from baseline       Perception     Praxis      Pertinent Vitals/Pain Pain Assessment: No/denies pain     Hand Dominance Right   Extremity/Trunk Assessment Upper Extremity Assessment Upper Extremity Assessment: Generalized weakness           Communication Communication Communication: HOH   Cognition Arousal/Alertness: Awake/alert Behavior During Therapy: WFL for tasks assessed/performed Overall Cognitive Status: Within Functional Limits for tasks assessed                                     General Comments       Exercises     Shoulder Instructions  Home Living Family/patient expects to be discharged to:: Assisted living                                        Prior Functioning/Environment Level of Independence: Needs assistance  Gait / Transfers Assistance Needed: per patient she has needed assit for the past week. ADL's / Homemaking Assistance Needed: patient reports she has needed assist over the past few weeks.            OT Problem List: Decreased strength;Decreased activity tolerance;Decreased knowledge of use of DME or AE      OT Treatment/Interventions: Self-care/ADL training;DME and/or AE instruction;Therapeutic activities;Patient/family education    OT Goals(Current goals can be found in the care plan section) Acute Rehab OT Goals Patient Stated Goal: be able to breath better OT Goal Formulation: With patient Time For Goal  Achievement: 03/08/19 Potential to Achieve Goals: Good  OT Frequency: Min 2X/week   Barriers to D/C:            Co-evaluation              AM-PAC OT "6 Clicks" Daily Activity     Outcome Measure Help from another person eating meals?: A Little Help from another person taking care of personal grooming?: A Little Help from another person toileting, which includes using toliet, bedpan, or urinal?: A Lot Help from another person bathing (including washing, rinsing, drying)?: A Lot Help from another person to put on and taking off regular upper body clothing?: A Little Help from another person to put on and taking off regular lower body clothing?: Total 6 Click Score: 14   End of Session Nurse Communication: (ok therapy)  Activity Tolerance: Patient limited by fatigue Patient left: in bed;with call bell/phone within reach;with bed alarm set;with nursing/sitter in room  OT Visit Diagnosis: Muscle weakness (generalized) (M62.81)                Time: 1010-1040 OT Time Calculation (min): 30 min Charges:  OT General Charges $OT Visit: 1 Visit OT Evaluation $OT Eval Low Complexity: 1 Low  6 CLICKS  Joey Lierman 02/22/2019, 10:55 AM

## 2019-02-22 NOTE — Progress Notes (Signed)
Pt is resident of [facility] assisted living. Pt and family known to CSW from previous encounter fall 2019 (see chart)- assisted with pt admitting to SNF at [facility]; [facility] did not have rehab beds at that time) for rehab following that stay. Discussed with pt's son Mia [place] this morning- he states if pt needs to attempt rehab after this stay family is agreeable. CSWs will follow to assist with disposition- anticipate will pursue SNF at [facility]. Pt will have PT eval here as well to assist with determining therapy needs. Will need [employer] authorization if admitting to SNF for rehab.  Sharren Bridge, MSW, LCSW Clinical Social Work 02/22/2019 [phone] weekend coverage for [phone]

## 2019-02-22 NOTE — Progress Notes (Signed)
  Echocardiogram 2D Echocardiogram has been performed.  [name] 02/22/2019, 11:25 AM

## 2019-02-22 NOTE — Progress Notes (Addendum)
PROGRESS NOTE  [name] BPZ:[id number] DOB: 18-Jul-1921 DOA: 02/21/2019 PCP: Virgie Dad, MD   LOS: 1 day   Brief Narrative / Interim history: 85 year old female with history of diastolic CHF, hypertension, diet-controlled diabetes mellitus, history of PSVT, history of breast cancer, chronic hypoxic respiratory failure on home O2 was admitted to the hospital on 3/7 with 2 weeks of generalized progressive weakness and increased dyspnea on exertion.  She was found to be significantly fluid overloaded as well as chest x-ray in the ED showed large left-sided pleural effusion.  She was admitted for CHF exacerbation.  Subjective: -Feels quite weak this morning, denies any chest pains, complains of shortness of breath every time she tries to do something  Assessment & Plan: Active Problems:   Acute decompensated heart failure (HCC)   Principal Problem Acute on chronic hypoxic respiratory failure due to acute on chronic diastolic CHF -Continue IV diuresis, continue strict I's and O's and daily weights, fluid and sodium restriction.  Remains quite dyspneic and tachypneic this morning -We will updated 2D echo -Repeat chest x-ray tomorrow after diuresis, if she has persistent large left-sided pleural effusion may need thoracentesis  Active Problems Troponin elevation -Flat, not in a pattern consistent with ACS but slowly uptrending and will continue to monitor until it comes back down  Possible UTI -Started empirically on cefdinir in the ED, continue for 3 days.  Follow urine cultures  Hypokalemia -Due to diuresis, continue to replete  Hypertension -Continue atenolol, Lasix, she was added on losartan on admission, blood pressure still on the high side and will keep the same regimen  Hyponatremia -In the setting of fluid overload, monitor with Lasix   Scheduled Meds: . atenolol  25 mg Oral BID  . B-complex with vitamin C  1 tablet Oral Daily  . cefdinir  300 mg Oral Q12H  .  dorzolamide-timolol  1 drop Left Eye BID  . DULoxetine  30 mg Oral Daily  . enoxaparin (LOVENOX) injection  40 mg Subcutaneous Q24H  . feeding supplement  237 mL Oral Q lunch  . ferrous gluconate  324 mg Oral Q breakfast  . folic acid  0.5 mg Oral Daily  . gabapentin  100 mg Oral BID  . latanoprost  1 drop Both Eyes QHS  . losartan  50 mg Oral Daily  . magnesium oxide  400 mg Oral BID  . multivitamin  1 tablet Oral Daily  . pantoprazole  40 mg Oral Daily  . potassium chloride  40 mEq Oral BID  . sodium chloride flush  3 mL Intravenous Q12H   Continuous Infusions: PRN Meds:.acetaminophen, hydrALAZINE, ipratropium-albuterol, polyethylene glycol  DVT prophylaxis: Lovenox Code Status: DNR Family Communication: No family present at bedside, will return in the afternoon and discussed with son Leroy Sea who is coming later Disposition Plan: Back to assisted living once improved  Consultants:   None  Procedures:   2D echo: Pending  Antimicrobials:  None    Objective: Vitals:   02/22/19 0519 02/22/19 0522 02/22/19 0825 02/22/19 0858  BP:  138/77  (!) 164/60  Pulse:  60 95 65  Resp:  15  20  Temp:  98 F (36.7 C)    TempSrc:  Oral    SpO2:  98% 98% 98%  Weight: 65.4 kg     Height:        Intake/Output Summary (Last 24 hours) at 02/22/2019 0953 Last data filed at 02/22/2019 0813 Gross per 24 hour  Intake 477 ml  Output  2300 ml  Net -1823 ml   Filed Weights   02/22/19 0034 02/22/19 0519  Weight: 67 kg 65.4 kg    Examination:  Constitutional: NAD Eyes: PERRL ENMT: Mucous membranes are moist.  Neck: normal, supple Respiratory: Decreased breath sounds on the left lung field, crackles heard on right, no wheezing.  Increased respiratory effort, tachypneic Cardiovascular: Regular rate and rhythm, no murmurs / rubs / gallops.  2+ pitting LE edema.  JVD present Abdomen: no tenderness. Bowel sounds positive.  Musculoskeletal: no clubbing / cyanosis. Skin: Bilateral lower  extremities erythematous up to mid shins Neurologic: CN 2-12 grossly intact. Strength 5/5 in all 4.  Psychiatric: Normal judgment and insight. Alert and oriented x 3. Normal mood.    Data Reviewed: I have independently reviewed following labs and imaging studies   Chest x-ray personally reviewed and with evidence of vascular congestion as well as large left pleural effusion  EKG and telemetry personally reviewed showing sinus rhythm with PACs  CBC: Recent Labs  Lab 02/21/19 1721 02/22/19 0450  WBC 7.5 8.9  NEUTROABS 6.0  --   HGB 12.9 11.7*  HCT 40.0 36.1  MCV 99.8 99.2  PLT 175 342   Basic Metabolic Panel: Recent Labs  Lab 02/21/19 1721 02/22/19 0450  NA 132* 134*  K 3.3* 3.2*  CL 90* 87*  CO2 30 34*  GLUCOSE 98 97  BUN 18 19  CREATININE 0.78 0.81  CALCIUM 8.9 8.7*  MG 1.5* 1.9  PHOS 3.0  --    GFR: Estimated Creatinine Clearance: 37.8 mL/min (by C-G formula based on SCr of 0.81 mg/dL). Liver Function Tests: Recent Labs  Lab 02/21/19 1721  AST 54*  ALT 39  ALKPHOS 161*  BILITOT 1.7*  PROT 8.4*  ALBUMIN 3.8   Recent Labs  Lab 02/21/19 1721  LIPASE 39   No results for input(s): AMMONIA in the last 168 hours. Coagulation Profile: Recent Labs  Lab 02/21/19 1721  INR 1.1   Cardiac Enzymes: Recent Labs  Lab 02/21/19 1721 02/22/19 0450  TROPONINI 0.05* 0.10*   BNP (last 3 results) No results for input(s): PROBNP in the last 8760 hours. HbA1C: No results for input(s): HGBA1C in the last 72 hours. CBG: No results for input(s): GLUCAP in the last 168 hours. Lipid Profile: No results for input(s): CHOL, HDL, LDLCALC, TRIG, CHOLHDL, LDLDIRECT in the last 72 hours. Thyroid Function Tests: Recent Labs    02/21/19 1721  TSH 1.964   Anemia Panel: No results for input(s): VITAMINB12, FOLATE, FERRITIN, TIBC, IRON, RETICCTPCT in the last 72 hours. Urine analysis:    Component Value Date/Time   COLORURINE YELLOW 02/21/2019 [place] 02/21/2019 1606   LABSPEC 1.013 02/21/2019 1606   PHURINE 6.0 02/21/2019 1606   GLUCOSEU NEGATIVE 02/21/2019 1606   HGBUR NEGATIVE 02/21/2019 1606   BILIRUBINUR NEGATIVE 02/21/2019 1606   KETONESUR NEGATIVE 02/21/2019 1606   PROTEINUR 100 (A) 02/21/2019 1606   NITRITE NEGATIVE 02/21/2019 1606   LEUKOCYTESUR SMALL (A) 02/21/2019 1606   Sepsis Labs: Invalid input(s): PROCALCITONIN, LACTICIDVEN  Recent Results (from the past 240 hour(s))  Culture, blood (routine x 2)     Status: None (Preliminary result)   Collection Time: 02/21/19  5:45 PM  Result Value Ref Range Status   Specimen Description   Final    BLOOD RIGHT FOREARM Performed at [facility] [address] [place], [place] [address]    Special Requests   Final    BOTTLES DRAWN  AEROBIC AND ANAEROBIC Blood Culture adequate volume Performed at [facility] [address] [place], [place] [address]    Culture   Final    NO GROWTH < 12 HOURS Performed at [facility] [address] [place], [place] [address]    Report Status PENDING  Incomplete  Culture, blood (routine x 2)     Status: None (Preliminary result)   Collection Time: 02/21/19  9:40 PM  Result Value Ref Range Status   Specimen Description   Final    BLOOD RIGHT ANTECUBITAL Performed at [facility] [address] [place], [place] [address]    Special Requests   Final    BOTTLES DRAWN AEROBIC ONLY Blood Culture adequate volume Performed at [facility] [address] [place], [place] [address]    Culture   Final    NO GROWTH < 12 HOURS Performed at [facility] [address], [place], [place] [address]    Report Status PENDING  Incomplete  MRSA PCR Screening     Status: None   Collection Time: 02/21/19 11:53 PM  Result Value Ref Range Status   MRSA by PCR NEGATIVE NEGATIVE Final    Comment:        The GeneXpert MRSA Assay (FDA approved for NASAL  specimens only), is one component of a comprehensive MRSA colonization surveillance program. It is not intended to diagnose MRSA infection nor to guide or monitor treatment for MRSA infections. Performed at [facility], [place] [address] [place], [place] [address]       Radiology Studies: Dg Chest 1 View  Result Date: 02/21/2019 CLINICAL DATA:  Weakness, shortness of Breath EXAM: CHEST  1 VIEW COMPARISON:  08/11/2018 FINDINGS: Large left pleural effusion, slightly increased since prior study. Cardiomegaly with vascular congestion. Left base atelectasis. Interstitial prominence throughout the lungs could reflect interstitial edema. IMPRESSION: Cardiomegaly with vascular congestion and probable mild interstitial edema. Large left pleural effusion with left base atelectasis. Electronically Signed   By: Rolm Baptise M.D.   On: 02/21/2019 17:06   Ct Head Wo Contrast  Result Date: 02/21/2019 CLINICAL DATA:  Weakness.  Hypokalemia.  Elevated blood pressure. EXAM: CT HEAD WITHOUT CONTRAST TECHNIQUE: Contiguous axial images were obtained from the base of the skull through the vertex without intravenous contrast. COMPARISON:  August 09, 2017 FINDINGS: Brain: No subdural, epidural, or subarachnoid hemorrhage identified. Cerebellum, brainstem, and basal cisterns are normal. Ventricles and sulci are unchanged and unremarkable. Moderate to severe white matter changes are similar in the interval. No acute cortical ischemia or infarct. No mass effect or midline shift. Vascular: No hyperdense vessel or unexpected calcification. Skull: Normal. Negative for fracture or focal lesion. Sinuses/Orbits: No acute finding. Other: None. IMPRESSION: 1. Chronic white matter changes. No acute intracranial abnormalities noted. Electronically Signed   By: Dorise Bullion III M.D   On: 02/21/2019 17:07    Marzetta Board, MD, PhD [facility]  Contact via  [email or web address]  [place] P:  [phone]  F: [phone]

## 2019-02-22 NOTE — Progress Notes (Signed)
CRITICAL VALUE ALERT  Critical Value:  Troponin 0.12  Date & Time Notied: 02/22/19 @ 3795  Provider Notified: Dr. Cruzita Lederer   Orders Received/Actions taken: continue current treatment

## 2019-02-23 ENCOUNTER — Inpatient Hospital Stay (HOSPITAL_COMMUNITY): Payer: Medicare HMO

## 2019-02-23 LAB — CBC
HCT: 35.9 % — ABNORMAL LOW (ref 36.0–46.0)
Hemoglobin: 11.5 g/dL — ABNORMAL LOW (ref 12.0–15.0)
MCH: 32.1 pg (ref 26.0–34.0)
MCHC: 32 g/dL (ref 30.0–36.0)
MCV: 100.3 fL — ABNORMAL HIGH (ref 80.0–100.0)
Platelets: 190 10*3/uL (ref 150–400)
RBC: 3.58 MIL/uL — ABNORMAL LOW (ref 3.87–5.11)
RDW: 17.2 % — ABNORMAL HIGH (ref 11.5–15.5)
WBC: 9.8 10*3/uL (ref 4.0–10.5)
nRBC: 0 % (ref 0.0–0.2)

## 2019-02-23 LAB — BODY FLUID CELL COUNT WITH DIFFERENTIAL
Eos, Fluid: 0 %
Lymphs, Fluid: 13 %
Monocyte-Macrophage-Serous Fluid: 36 % — ABNORMAL LOW (ref 50–90)
Neutrophil Count, Fluid: 51 % — ABNORMAL HIGH (ref 0–25)
Total Nucleated Cell Count, Fluid: 572 cu mm (ref 0–1000)

## 2019-02-23 LAB — BASIC METABOLIC PANEL
ANION GAP: 12 (ref 5–15)
BUN: 25 mg/dL — ABNORMAL HIGH (ref 8–23)
CO2: 37 mmol/L — ABNORMAL HIGH (ref 22–32)
Calcium: 8.7 mg/dL — ABNORMAL LOW (ref 8.9–10.3)
Chloride: 84 mmol/L — ABNORMAL LOW (ref 98–111)
Creatinine, Ser: 0.96 mg/dL (ref 0.44–1.00)
GFR calc non Af Amer: 50 mL/min — ABNORMAL LOW (ref >60)
GFR, EST AFRICAN AMERICAN: 58 mL/min — AB (ref >60)
Glucose, Bld: 103 mg/dL — ABNORMAL HIGH (ref 70–99)
Potassium: 3.9 mmol/L (ref 3.5–5.1)
Sodium: 133 mmol/L — ABNORMAL LOW (ref 135–145)

## 2019-02-23 LAB — MAGNESIUM: Magnesium: 1.8 mg/dL (ref 1.7–2.4)

## 2019-02-23 MED ORDER — SODIUM CHLORIDE 1 G PO TABS
1.0000 g | ORAL_TABLET | Freq: Every day | ORAL | Status: DC
  Administered 2019-02-23 – 2019-02-26 (×4): 1 g via ORAL
  Filled 2019-02-23 (×4): qty 1

## 2019-02-23 MED ORDER — LIDOCAINE HCL 1 % IJ SOLN
INTRAMUSCULAR | Status: AC
  Filled 2019-02-23: qty 20

## 2019-02-23 NOTE — Procedures (Signed)
Ultrasound-guided diagnostic and therapeutic left thoracentesis performed yielding 770 cc of yellow fluid. No immediate complications. Follow-up chest x-ray pending. The fluid was sent to the lab for preordered studies. EBL none. Due to pt coughing only the above amount of fluid was removed today.

## 2019-02-23 NOTE — Consult Note (Signed)
   [facility] CM Inpatient Consult   02/23/2019  [name] August 29, 1921 [id number]   Patient screened for potential [facility] Care Management services due to unplanned readmission risk score of 26%, high.  Chart review reveals patient resides at an assisted living facility. Chart review reveals current disposition plan is for SNF. No [facility] Care Management identifiable needs.   [name], MSN, [facility] Liaison Nurse Mobile Phone [phone]  Toll free office [phone]

## 2019-02-23 NOTE — Progress Notes (Signed)
PROGRESS NOTE  HAMDI VARI XIP:[id number] DOB: 1921/10/22 DOA: 02/21/2019 PCP: Virgie Dad, MD   LOS: 2 days   Brief Narrative / Interim history: 85 year old female with history of diastolic CHF, hypertension, diet-controlled diabetes mellitus, history of PSVT, history of breast cancer, chronic hypoxic respiratory failure on home O2 was admitted to the hospital on 3/7 with 2 weeks of generalized progressive weakness and increased dyspnea on exertion.  She was found to be significantly fluid overloaded as well as chest x-ray in the ED showed large left-sided pleural effusion.  She was admitted for CHF exacerbation.  Subjective: -Little bit better this morning, slept well last night, still complains of shortness of breath but improved.  No chest pain  Assessment & Plan: Active Problems:   Acute decompensated heart failure (HCC)   Principal Problem Acute on chronic hypoxic respiratory failure due to acute on chronic diastolic CHF -Continue IV diuresis, continue strict I's and O's and daily weights, fluid and sodium restriction.  Remains quite dyspneic and tachypneic this morning -2D echo done on 3/8 showed normal EF of 60 to 65% however showed RV systolic pressure increased -Repeat chest x-ray today, if she has persistent large left-sided pleural effusion will pursue thoracentesis  Active Problems Troponin elevation -Flat, nontender.  Consistent with ACS, no chest pain, likely demand  Possible UTI -Started empirically on cefdinir in the ED, continue for 3 days.  Follow urine cultures, currently with Klebsiella  Hypokalemia -K3.9 this morning, continue to monitor while getting diuresis  Hypertension -Continue atenolol, Lasix, she was added on losartan on admission, blood pressure still on the high side and will keep the same regimen  Hyponatremia -Mild hyponatremia persistent however stable   Scheduled Meds: . atenolol  25 mg Oral BID  . B-complex with vitamin C  1 tablet Oral  Daily  . cefdinir  300 mg Oral Q12H  . dorzolamide-timolol  1 drop Left Eye BID  . DULoxetine  30 mg Oral Daily  . enoxaparin (LOVENOX) injection  40 mg Subcutaneous Q24H  . feeding supplement  237 mL Oral Q lunch  . ferrous gluconate  324 mg Oral Q breakfast  . folic acid  0.5 mg Oral Daily  . gabapentin  100 mg Oral BID  . latanoprost  1 drop Both Eyes QHS  . losartan  50 mg Oral Daily  . magnesium oxide  400 mg Oral BID  . multivitamin  1 tablet Oral Daily  . pantoprazole  40 mg Oral Daily  . potassium chloride  40 mEq Oral BID  . sodium chloride flush  3 mL Intravenous Q12H   Continuous Infusions: PRN Meds:.acetaminophen, hydrALAZINE, ipratropium-albuterol, polyethylene glycol  DVT prophylaxis: Lovenox Code Status: DNR Family Communication: No family present at bedside, will return in the afternoon and discussed with son Leroy Sea who is coming later Disposition Plan: Back to assisted living once improved  Consultants:   None  Procedures:   2D echo:  IMPRESSIONS   1. The left ventricle has normal systolic function with an ejection fraction of 60-65%. The cavity size was normal. There is mildly increased left ventricular wall thickness. Left ventricular diastolic Doppler parameters are consistent with  pseudonormalization. There is right ventricular volume and pressure overload. No evidence of left ventricular regional wall motion abnormalities.  2. The right ventricle has mildly reduced systolic function. The cavity was mildly enlarged. There is no increase in right ventricular wall thickness. Right ventricular systolic pressure is severely elevated with an estimated pressure of 74.6 mmHg.  3. Left atrial size was moderately dilated.  4. Right atrial size was mildly dilated.  5. The mitral valve is normal in structure. There is mild mitral annular calcification present.  6. The tricuspid valve is normal in structure. Tricuspid valve regurgitation is mild-moderate.  7. The  aortic valve is tricuspid. Aortic valve regurgitation is trivial by color flow Doppler.  8. The aortic root is normal in size and structure.  Antimicrobials:  None    Objective: Vitals:   02/22/19 1128 02/22/19 1304 02/22/19 2145 02/23/19 0539  BP: (!) 172/80 (!) 143/80 (!) 163/89 (!) 164/86  Pulse: (!) 53 85 81 65  Resp: 18 16 17 18   Temp:  98.8 F (37.1 C) 98.4 F (36.9 C) 98.7 F (37.1 C)  TempSrc:  Oral Oral Oral  SpO2: 99% 98% 98% 94%  Weight:    66.5 kg  Height:        Intake/Output Summary (Last 24 hours) at 02/23/2019 1033 Last data filed at 02/23/2019 4580 Gross per 24 hour  Intake 237 ml  Output 2200 ml  Net -1963 ml   Filed Weights   02/22/19 0034 02/22/19 0519 02/23/19 0539  Weight: 67 kg 65.4 kg 66.5 kg    Examination:  Constitutional: NAD Eyes: no scleral icterus  ENMT: mmm Neck: normal, supple Respiratory: Decreased breath sounds at the base of the left lung, crackles on right, no wheezing heard. Cardiovascular: Regular rate and rhythm, no murmurs.  1+ pitting lower extremity edema, JVD present Abdomen: Soft, nontender, nondistended, bowel sounds positive Musculoskeletal: no clubbing / cyanosis. Skin: No new rashes Neurologic: No focal deficits, equal strength Psychiatric: Normal judgment and insight. Alert and oriented x 3. Normal mood.    Data Reviewed: I have independently reviewed following labs and imaging studies    CBC: Recent Labs  Lab 03-04-2019 1721 02/22/19 0450 02/23/19 0413  WBC 7.5 8.9 9.8  NEUTROABS 6.0  --   --   HGB 12.9 11.7* 11.5*  HCT 40.0 36.1 35.9*  MCV 99.8 99.2 100.3*  PLT 175 184 998   Basic Metabolic Panel: Recent Labs  Lab 03/04/19 1721 02/22/19 0450 02/23/19 0413  NA 132* 134* 133*  K 3.3* 3.2* 3.9  CL 90* 87* 84*  CO2 30 34* 37*  GLUCOSE 98 97 103*  BUN 18 19 25*  CREATININE 0.78 0.81 0.96  CALCIUM 8.9 8.7* 8.7*  MG 1.5* 1.9 1.8  PHOS 3.0  --   --    GFR: Estimated Creatinine Clearance: 32.1  mL/min (by C-G formula based on SCr of 0.96 mg/dL). Liver Function Tests: Recent Labs  Lab 03-04-2019 1721  AST 54*  ALT 39  ALKPHOS 161*  BILITOT 1.7*  PROT 8.4*  ALBUMIN 3.8   Recent Labs  Lab 03-04-19 1721  LIPASE 39   No results for input(s): AMMONIA in the last 168 hours. Coagulation Profile: Recent Labs  Lab Mar 04, 2019 1721  INR 1.1   Cardiac Enzymes: Recent Labs  Lab 03-04-2019 1721 02/22/19 0450 02/22/19 1646  TROPONINI 0.05* 0.10* 0.12*   BNP (last 3 results) No results for input(s): PROBNP in the last 8760 hours. HbA1C: No results for input(s): HGBA1C in the last 72 hours. CBG: No results for input(s): GLUCAP in the last 168 hours. Lipid Profile: No results for input(s): CHOL, HDL, LDLCALC, TRIG, CHOLHDL, LDLDIRECT in the last 72 hours. Thyroid Function Tests: Recent Labs    03-04-2019 1721  TSH 1.964   Anemia Panel: No results for input(s): VITAMINB12, FOLATE, FERRITIN, TIBC,  IRON, RETICCTPCT in the last 72 hours. Urine analysis:    Component Value Date/Time   COLORURINE YELLOW 02/21/2019 [place] 02/21/2019 1606   LABSPEC 1.013 02/21/2019 1606   PHURINE 6.0 02/21/2019 1606   GLUCOSEU NEGATIVE 02/21/2019 1606   HGBUR NEGATIVE 02/21/2019 1606   BILIRUBINUR NEGATIVE 02/21/2019 1606   KETONESUR NEGATIVE 02/21/2019 1606   PROTEINUR 100 (A) 02/21/2019 1606   NITRITE NEGATIVE 02/21/2019 1606   LEUKOCYTESUR SMALL (A) 02/21/2019 1606   Sepsis Labs: Invalid input(s): PROCALCITONIN, LACTICIDVEN  Recent Results (from the past 240 hour(s))  Urine culture     Status: Abnormal (Preliminary result)   Collection Time: 02/21/19  4:32 PM  Result Value Ref Range Status   Specimen Description   Final    URINE, RANDOM Performed at [facility] [address], [place], [place] [address]    Special Requests   Final    NONE Performed at [facility], [place] [address] [place], [place] [address]    Culture  >=100,000 COLONIES/mL KLEBSIELLA SPECIES (A)  Final   Report Status PENDING  Incomplete  Culture, blood (routine x 2)     Status: None (Preliminary result)   Collection Time: 02/21/19  5:45 PM  Result Value Ref Range Status   Specimen Description   Final    BLOOD RIGHT FOREARM Performed at [facility] [address] [place], [place] [address]    Special Requests   Final    BOTTLES DRAWN AEROBIC AND ANAEROBIC Blood Culture adequate volume Performed at [facility] [address] [place], [place] [address]    Culture   Final    NO GROWTH 2 DAYS Performed at [facility] [address], [place], [place] [address]    Report Status PENDING  Incomplete  Culture, blood (routine x 2)     Status: None (Preliminary result)   Collection Time: 02/21/19  9:40 PM  Result Value Ref Range Status   Specimen Description   Final    BLOOD RIGHT ANTECUBITAL Performed at [facility] [address] [place], [place] [address]    Special Requests   Final    BOTTLES DRAWN AEROBIC ONLY Blood Culture adequate volume Performed at [facility] [address], [place], [place] [address]    Culture   Final    NO GROWTH 2 DAYS Performed at [facility] [address], [place], [place] [address]    Report Status PENDING  Incomplete  MRSA PCR Screening     Status: None   Collection Time: 02/21/19 11:53 PM  Result Value Ref Range Status   MRSA by PCR NEGATIVE NEGATIVE Final    Comment:        The GeneXpert MRSA Assay (FDA approved for NASAL specimens only), is one component of a comprehensive MRSA colonization surveillance program. It is not intended to diagnose MRSA infection nor to guide or monitor treatment for MRSA infections. Performed at [facility], [place] [address], [place], [place] [address]       Radiology Studies: Dg Chest 1 View  Result Date: 02/21/2019 CLINICAL DATA:   Weakness, shortness of Breath EXAM: CHEST  1 VIEW COMPARISON:  08/11/2018 FINDINGS: Large left pleural effusion, slightly increased since prior study. Cardiomegaly with vascular congestion. Left base atelectasis. Interstitial prominence throughout the lungs could reflect interstitial edema. IMPRESSION: Cardiomegaly with vascular congestion and probable mild interstitial edema. Large left pleural effusion  with left base atelectasis. Electronically Signed   By: Rolm Baptise M.D.   On: 02/21/2019 17:06   Ct Head Wo Contrast  Result Date: 02/21/2019 CLINICAL DATA:  Weakness.  Hypokalemia.  Elevated blood pressure. EXAM: CT HEAD WITHOUT CONTRAST TECHNIQUE: Contiguous axial images were obtained from the base of the skull through the vertex without intravenous contrast. COMPARISON:  August 09, 2017 FINDINGS: Brain: No subdural, epidural, or subarachnoid hemorrhage identified. Cerebellum, brainstem, and basal cisterns are normal. Ventricles and sulci are unchanged and unremarkable. Moderate to severe white matter changes are similar in the interval. No acute cortical ischemia or infarct. No mass effect or midline shift. Vascular: No hyperdense vessel or unexpected calcification. Skull: Normal. Negative for fracture or focal lesion. Sinuses/Orbits: No acute finding. Other: None. IMPRESSION: 1. Chronic white matter changes. No acute intracranial abnormalities noted. Electronically Signed   By: Dorise Bullion III M.D   On: 02/21/2019 17:07    Marzetta Board, MD, PhD [facility]  Contact via  [email or web address]  [place] P: [phone]  F: [phone]

## 2019-02-24 ENCOUNTER — Inpatient Hospital Stay (HOSPITAL_COMMUNITY): Payer: Medicare HMO

## 2019-02-24 LAB — CBC
HCT: 34.7 % — ABNORMAL LOW (ref 36.0–46.0)
HEMOGLOBIN: 11 g/dL — AB (ref 12.0–15.0)
MCH: 32.4 pg (ref 26.0–34.0)
MCHC: 31.7 g/dL (ref 30.0–36.0)
MCV: 102.1 fL — ABNORMAL HIGH (ref 80.0–100.0)
Platelets: 180 10*3/uL (ref 150–400)
RBC: 3.4 MIL/uL — ABNORMAL LOW (ref 3.87–5.11)
RDW: 17 % — ABNORMAL HIGH (ref 11.5–15.5)
WBC: 7.4 10*3/uL (ref 4.0–10.5)
nRBC: 0 % (ref 0.0–0.2)

## 2019-02-24 LAB — BASIC METABOLIC PANEL
Anion gap: 8 (ref 5–15)
BUN: 23 mg/dL (ref 8–23)
CO2: 35 mmol/L — ABNORMAL HIGH (ref 22–32)
Calcium: 8.6 mg/dL — ABNORMAL LOW (ref 8.9–10.3)
Chloride: 89 mmol/L — ABNORMAL LOW (ref 98–111)
Creatinine, Ser: 0.83 mg/dL (ref 0.44–1.00)
GFR calc Af Amer: >60 mL/min (ref >60)
GFR, EST NON AFRICAN AMERICAN: 60 mL/min — AB (ref >60)
GLUCOSE: 95 mg/dL (ref 70–99)
POTASSIUM: 4.7 mmol/L (ref 3.5–5.1)
Sodium: 132 mmol/L — ABNORMAL LOW (ref 135–145)

## 2019-02-24 LAB — URINE CULTURE

## 2019-02-24 LAB — D-DIMER, QUANTITATIVE: D-Dimer, Quant: 3.29 ug/mL-FEU — ABNORMAL HIGH (ref 0.00–0.50)

## 2019-02-24 LAB — GRAM STAIN

## 2019-02-24 LAB — MAGNESIUM: Magnesium: 1.8 mg/dL (ref 1.7–2.4)

## 2019-02-24 MED ORDER — SODIUM CHLORIDE (PF) 0.9 % IJ SOLN
INTRAMUSCULAR | Status: AC
  Filled 2019-02-24: qty 100

## 2019-02-24 MED ORDER — IOHEXOL 350 MG/ML SOLN
100.0000 mL | Freq: Once | INTRAVENOUS | Status: AC | PRN
  Administered 2019-02-24: 100 mL via INTRAVENOUS

## 2019-02-24 MED ORDER — AZITHROMYCIN 250 MG PO TABS
500.0000 mg | ORAL_TABLET | Freq: Every day | ORAL | Status: DC
  Administered 2019-02-24: 500 mg via ORAL
  Filled 2019-02-24: qty 2

## 2019-02-24 MED ORDER — FUROSEMIDE 10 MG/ML IJ SOLN
40.0000 mg | Freq: Two times a day (BID) | INTRAMUSCULAR | Status: DC
  Administered 2019-02-24 – 2019-02-25 (×3): 40 mg via INTRAVENOUS
  Filled 2019-02-24 (×3): qty 4

## 2019-02-24 NOTE — Care Management Important Message (Signed)
Important Message  Patient Details  Name: NIKOLETTA VARMA MRN: [id number] Date of Birth: [birth year]   Medicare Important Message Given:  Yes    Jaydin Boniface 02/24/2019, 8:31 AM

## 2019-02-24 NOTE — Progress Notes (Signed)
   02/24/19 1045  Vitals  BP (!) 184/87  MAP (mmHg) 114  BP Method Automatic  Pulse Rate 61  Patient reports she feel like she, "Cannot breath good."  Sats 100% on 3L [place].  Lungs clear.  RT called for breathing treatment. Dr. Cruzita Lederer notified via text page.

## 2019-02-24 NOTE — Progress Notes (Signed)
Physical Therapy Treatment Patient Details Name: [name] MRN: [id number] DOB: 1921-01-11 Today's Date: 02/24/2019    History of Present Illness 85 year old female with history of diastolic CHF, hypertension, diet-controlled diabetes mellitus, history of PSVT, history of breast cancer, chronic hypoxic respiratory failure on home O2 was admitted to the hospital on 3/7 with 2 weeks of generalized progressive weakness and increased dyspnea on exertion.  She was found to be significantly fluid overloaded as well as chest x-ray in the ED showed large left-sided pleural effusion.  She was admitted for CHF exacerbation.    PT Comments    Attempted tx session on today. Pt remains very weak. She was unable to fully sit up at EOB, even with support. Pt repeatedly stated "I can't.". Unable to attempt OOB with +1 assist for safety reasons/fall risk. Family present in room stated pt has not been eating. Will continue to follow and progress activity as pt is able to tolerate. Pt will need SNF rehab.    Follow Up Recommendations  SNF     Equipment Recommendations  None recommended by PT    Recommendations for Other Services       Precautions / Restrictions Precautions Precautions: Fall Precaution Comments: monitor O2 Restrictions Weight Bearing Restrictions: No    Mobility  Bed Mobility Overal bed mobility: Needs Assistance Bed Mobility: Supine to Sit;Sit to Supine     Supine to sit: Mod assist;HOB elevated Sit to supine: Mod assist;HOB elevated   General bed mobility comments: Assist for trunk and bil LEs. Pt only able to sit up partially. L elbow remained propped on bed for support. Pt puts forth effort but she is very weak.   Transfers                 General transfer comment: NT-pt unable with +1 assist-pt repeatedly stated "I can't stand"  Ambulation/Gait                 Stairs             Wheelchair Mobility    Modified Rankin (Stroke Patients  Only)       Balance Overall balance assessment: Needs assistance   Sitting balance-Leahy Scale: Poor                                      Cognition Arousal/Alertness: Awake/alert Behavior During Therapy: WFL for tasks assessed/performed Overall Cognitive Status: Within Functional Limits for tasks assessed                                        Exercises      General Comments        Pertinent Vitals/Pain Pain Assessment: Faces Faces Pain Scale: Hurts little more Pain Location: bil LEs when touching them Pain Intervention(s): Limited activity within patient's tolerance;Repositioned    Home Living                      Prior Function            PT Goals (current goals can now be found in the care plan section) Progress towards PT goals: Progressing toward goals    Frequency    Min 2X/week      PT Plan Current plan remains appropriate    Co-evaluation  AM-PAC PT "6 Clicks" Mobility   Outcome Measure  Help needed turning from your back to your side while in a flat bed without using bedrails?: A Lot Help needed moving from lying on your back to sitting on the side of a flat bed without using bedrails?: A Lot Help needed moving to and from a bed to a chair (including a wheelchair)?: Total Help needed standing up from a chair using your arms (e.g., wheelchair or bedside chair)?: Total Help needed to walk in hospital room?: Total Help needed climbing 3-5 steps with a railing? : Total 6 Click Score: 8    End of Session   Activity Tolerance: Patient limited by fatigue Patient left: in bed;with call bell/phone within reach;with nursing/sitter in room;with family/visitor present   PT Visit Diagnosis: Muscle weakness (generalized) (M62.81)     Time: 3979-5369 PT Time Calculation (min) (ACUTE ONLY): 10 min  Charges:  $Therapeutic Activity: 8-22 mins                        Weston Anna,  PT Acute Rehabilitation Services Pager: [phone] Office: [phone]

## 2019-02-24 NOTE — Progress Notes (Signed)
Patient reports she feels like she cannot breath again.  States it, "Hurts to take a deep breath."  VS obtained.  Reports breathing treatment helped earlier.  Chest xray results now in Epic.  Dr. Carolin Sicks notified via text page.

## 2019-02-24 NOTE — Progress Notes (Addendum)
PROGRESS NOTE  [name] XNT:[id number] DOB: [birth year] DOA: 02/21/2019 PCP: Virgie Dad, MD   LOS: 3 days   Brief Narrative / Interim history: 85 year old female with history of diastolic CHF, hypertension, diet-controlled diabetes mellitus, history of PSVT, history of breast cancer, chronic hypoxic respiratory failure on home O2 was admitted to the hospital on 3/7 with 2 weeks of generalized progressive weakness and increased dyspnea on exertion.  She was found to be significantly fluid overloaded as well as chest x-ray in the ED showed large left-sided pleural effusion.  She was admitted for CHF exacerbation.  Subjective: -Little bit better this morning, slept well last night, still complains of shortness of breath but improved.  No chest pain  Assessment & Plan: Active Problems:   Acute decompensated heart failure (HCC)   Principal Problem Acute on chronic hypoxic respiratory failure due to acute on chronic diastolic CHF -2D echo done on 3/8 showed normal EF of 60 to 65% however showed severely increased RV systolic pressure.  We will check a d-dimer, if positive adjusted for age may need a CT angiogram -Patient was placed on IV diuresis over the weekend, chest x-ray on Monday morning showed persistent left-sided pleural effusion and underwent a thoracentesis on 3/9 removing 700 cc of fluid -Remains with evidence of fluid overload on 3/10 in the morning, complains of shortness of breath, reinitiate IV Lasix 40 mg every 12  Active Problems Troponin elevation -Flat, not in a pattern consistent with ACS, no chest pain  Possible UTI -Started empirically on cefdinir in the ED, continue for 3 days.  Follow urine cultures, currently with Klebsiella which is now sensitive to ceftriaxone.  ?  Pneumonia on chest x-ray -Patient without cough or chest congestion, afebrile.  This is less likely, however she is covered with Omnicef as above  Hypokalemia -Potassium this morning improved  to 4.7, monitor while getting IV Lasix  Hypertension -Continue atenolol, Lasix, she was added on losartan on admission, blood pressure still on the high side and will keep the same regimen  Hyponatremia -Mild hyponatremia persistent however stable   Scheduled Meds: . atenolol  25 mg Oral BID  . B-complex with vitamin C  1 tablet Oral Daily  . cefdinir  300 mg Oral Q12H  . dorzolamide-timolol  1 drop Left Eye BID  . DULoxetine  30 mg Oral Daily  . enoxaparin (LOVENOX) injection  40 mg Subcutaneous Q24H  . feeding supplement  237 mL Oral Q lunch  . ferrous gluconate  324 mg Oral Q breakfast  . folic acid  0.5 mg Oral Daily  . gabapentin  100 mg Oral BID  . latanoprost  1 drop Both Eyes QHS  . losartan  50 mg Oral Daily  . magnesium oxide  400 mg Oral BID  . multivitamin  1 tablet Oral Daily  . pantoprazole  40 mg Oral Daily  . potassium chloride  40 mEq Oral BID  . sodium chloride flush  3 mL Intravenous Q12H  . sodium chloride  1 g Oral Daily   Continuous Infusions: PRN Meds:.acetaminophen, hydrALAZINE, ipratropium-albuterol, polyethylene glycol  DVT prophylaxis: Lovenox Code Status: DNR Family Communication: Discussed with daughter-in-law at bedside Disposition Plan: Back to SNF once improved  Consultants:   None  Procedures:   2D echo:  IMPRESSIONS   1. The left ventricle has normal systolic function with an ejection fraction of 60-65%. The cavity size was normal. There is mildly increased left ventricular wall thickness. Left ventricular diastolic Doppler parameters  are consistent with  pseudonormalization. There is right ventricular volume and pressure overload. No evidence of left ventricular regional wall motion abnormalities.  2. The right ventricle has mildly reduced systolic function. The cavity was mildly enlarged. There is no increase in right ventricular wall thickness. Right ventricular systolic pressure is severely elevated with an estimated pressure of  74.6 mmHg.  3. Left atrial size was moderately dilated.  4. Right atrial size was mildly dilated.  5. The mitral valve is normal in structure. There is mild mitral annular calcification present.  6. The tricuspid valve is normal in structure. Tricuspid valve regurgitation is mild-moderate.  7. The aortic valve is tricuspid. Aortic valve regurgitation is trivial by color flow Doppler.  8. The aortic root is normal in size and structure.  Antimicrobials:  Omnicef 3/8 --  Objective: Vitals:   02/24/19 1043 02/24/19 1045 02/24/19 1105 02/24/19 1152  BP: (!) 183/84 (!) 184/87  (!) 186/82  Pulse: 64 61  62  Resp: 14     Temp:      TempSrc:      SpO2: 100%  100% 100%  Weight:      Height:       No intake or output data in the 24 hours ending 02/24/19 1158 Filed Weights   02/22/19 0519 02/23/19 0539 02/24/19 0433  Weight: 65.4 kg 66.5 kg 65 kg    Examination:  Constitutional: Appears fatigued but in no apparent distress Eyes: no scleral icterus ENMT: Moist mucous membranes Neck: normal, supple Respiratory: Decreased breath sounds at the bases, no crackles or wheezing heard Cardiovascular: Regular rate and rhythm, 1+ pitting lower extremity edema, JVD present Abdomen: Soft, nontender, nondistended, positive bowel sounds Musculoskeletal: no clubbing / cyanosis. Skin: No rashes seen Neurologic: Nonfocal, equal strength however with generalized weakness Psychiatric: Alert and oriented x3   Data Reviewed: I have independently reviewed following labs and imaging studies    CBC: Recent Labs  Lab 03-05-19 1721 02/22/19 0450 02/23/19 0413 02/24/19 0456  WBC 7.5 8.9 9.8 7.4  NEUTROABS 6.0  --   --   --   HGB 12.9 11.7* 11.5* 11.0*  HCT 40.0 36.1 35.9* 34.7*  MCV 99.8 99.2 100.3* 102.1*  PLT 175 184 190 629   Basic Metabolic Panel: Recent Labs  Lab 03/05/19 1721 02/22/19 0450 02/23/19 0413 02/24/19 0456  NA 132* 134* 133* 132*  K 3.3* 3.2* 3.9 4.7  CL 90* 87* 84*  89*  CO2 30 34* 37* 35*  GLUCOSE 98 97 103* 95  BUN 18 19 25* 23  CREATININE 0.78 0.81 0.96 0.83  CALCIUM 8.9 8.7* 8.7* 8.6*  MG 1.5* 1.9 1.8 1.8  PHOS 3.0  --   --   --    GFR: Estimated Creatinine Clearance: 36.7 mL/min (by C-G formula based on SCr of 0.83 mg/dL). Liver Function Tests: Recent Labs  Lab March 05, 2019 1721  AST 54*  ALT 39  ALKPHOS 161*  BILITOT 1.7*  PROT 8.4*  ALBUMIN 3.8   Recent Labs  Lab 03/05/2019 1721  LIPASE 39   No results for input(s): AMMONIA in the last 168 hours. Coagulation Profile: Recent Labs  Lab 2019-03-05 1721  INR 1.1   Cardiac Enzymes: Recent Labs  Lab Mar 05, 2019 1721 02/22/19 0450 02/22/19 1646  TROPONINI 0.05* 0.10* 0.12*   BNP (last 3 results) No results for input(s): PROBNP in the last 8760 hours. HbA1C: No results for input(s): HGBA1C in the last 72 hours. CBG: No results for input(s): GLUCAP in the  last 168 hours. Lipid Profile: No results for input(s): CHOL, HDL, LDLCALC, TRIG, CHOLHDL, LDLDIRECT in the last 72 hours. Thyroid Function Tests: Recent Labs    02/21/19 1721  TSH 1.964   Anemia Panel: No results for input(s): VITAMINB12, FOLATE, FERRITIN, TIBC, IRON, RETICCTPCT in the last 72 hours. Urine analysis:    Component Value Date/Time   COLORURINE YELLOW 02/21/2019 [place] 02/21/2019 1606   LABSPEC 1.013 02/21/2019 1606   PHURINE 6.0 02/21/2019 1606   GLUCOSEU NEGATIVE 02/21/2019 1606   HGBUR NEGATIVE 02/21/2019 1606   BILIRUBINUR NEGATIVE 02/21/2019 1606   KETONESUR NEGATIVE 02/21/2019 1606   PROTEINUR 100 (A) 02/21/2019 1606   NITRITE NEGATIVE 02/21/2019 1606   LEUKOCYTESUR SMALL (A) 02/21/2019 1606   Sepsis Labs: Invalid input(s): PROCALCITONIN, LACTICIDVEN  Recent Results (from the past 240 hour(s))  Urine culture     Status: Abnormal   Collection Time: 02/21/19  4:32 PM  Result Value Ref Range Status   Specimen Description   Final    URINE, RANDOM Performed at [facility] [address], [place], [place] [address]    Special Requests   Final    NONE Performed at [facility], [employer] [address], [place], [place] [address]    Culture >=100,000 COLONIES/mL KLEBSIELLA SPECIES (A)  Final   Report Status 02/24/2019 FINAL  Final   Organism ID, Bacteria KLEBSIELLA SPECIES (A)  Final      Susceptibility   Klebsiella species - MIC*    AMPICILLIN RESISTANT Resistant     CEFAZOLIN <=4 SENSITIVE Sensitive     CEFTRIAXONE <=1 SENSITIVE Sensitive     CIPROFLOXACIN <=0.25 SENSITIVE Sensitive     GENTAMICIN <=1 SENSITIVE Sensitive     IMIPENEM <=0.25 SENSITIVE Sensitive     NITROFURANTOIN <=16 SENSITIVE Sensitive     TRIMETH/SULFA <=20 SENSITIVE Sensitive     AMPICILLIN/SULBACTAM <=2 SENSITIVE Sensitive     PIP/TAZO <=4 SENSITIVE Sensitive     Extended ESBL NEGATIVE Sensitive     * >=100,000 COLONIES/mL KLEBSIELLA SPECIES  Culture, blood (routine x 2)     Status: None (Preliminary result)   Collection Time: 02/21/19  5:45 PM  Result Value Ref Range Status   Specimen Description   Final    BLOOD RIGHT FOREARM Performed at [facility] [address], [place], [place] [address]    Special Requests   Final    BOTTLES DRAWN AEROBIC AND ANAEROBIC Blood Culture adequate volume Performed at [facility] [address], [place], [place] [address]    Culture   Final    NO GROWTH 3 DAYS Performed at [facility], [place] [address], [place], [place] [address]    Report Status PENDING  Incomplete  Culture, blood (routine x 2)     Status: None (Preliminary result)   Collection Time: 02/21/19  9:40 PM  Result Value Ref Range Status   Specimen Description   Final    BLOOD RIGHT ANTECUBITAL Performed at [facility] [address], [place], [place] [address]    Special Requests   Final    BOTTLES DRAWN AEROBIC ONLY Blood Culture adequate volume Performed at [facility] [address], [place], [place] [address]    Culture   Final    NO GROWTH 3 DAYS Performed at [facility], [place] [address], [place], [place] [address]    Report Status PENDING  Incomplete  MRSA  PCR Screening     Status: None   Collection Time: 02/21/19 11:53 PM  Result Value Ref Range Status   MRSA by PCR NEGATIVE NEGATIVE Final    Comment:        The GeneXpert MRSA Assay (FDA approved for NASAL specimens only), is one component of a comprehensive MRSA colonization surveillance program. It is not intended to diagnose MRSA infection nor to guide or monitor treatment for MRSA infections. Performed at [facility], [place] [address], [place], [place] [address]   Culture, body fluid-bottle     Status: None (Preliminary result)   Collection Time: 02/23/19  4:31 PM  Result Value Ref Range Status   Specimen Description FLUID PLEURAL LEFT  Final   Special Requests BOTTLES DRAWN AEROBIC AND ANAEROBIC  Final   Culture   Final    NO GROWTH < 12 HOURS Performed at [facility], [place] [address] [place], [place] [address]    Report Status PENDING  Incomplete  Gram stain     Status: None   Collection Time: 02/23/19  4:31 PM  Result Value Ref Range Status   Specimen Description FLUID PLEURAL LEFT  Final   Special Requests NONE  Final   Gram Stain   Final    WBC PRESENT,BOTH PMN AND MONONUCLEAR NO ORGANISMS SEEN CYTOSPIN SMEAR Performed at [facility], [address], [place], [place] [address]    Report Status 02/24/2019 FINAL  Final      Radiology Studies: Dg Chest 1 View  Result Date: 02/23/2019 CLINICAL DATA:  Status post thoracentesis. EXAM: CHEST  1 VIEW COMPARISON:  Radiographs of February 23, 2019. FINDINGS: Stable cardiomegaly. Left pleural effusion is significantly smaller status post thoracentesis. No pneumothorax is noted. Right lung is unremarkable. Bony thorax is unremarkable. IMPRESSION: Left pleural effusion is  significantly smaller status post thoracentesis. No pneumothorax is noted. Electronically Signed   By: Marijo Conception, M.D.   On: 02/23/2019 16:28   Dg Chest 2 View  Result Date: 02/23/2019 CLINICAL DATA:  Shortness of breath.  Confusion. EXAM: CHEST - 2 VIEW COMPARISON:  February 21, 2019 FINDINGS: There is extensive airspace consolidation throughout the left lower lobe with left pleural effusion. The right lung is clear. There is cardiomegaly with pulmonary vascularity normal. No adenopathy. There is aortic atherosclerosis. No evident bone lesions. 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 IMPRESSION: Extensive consolidation throughout the left lower lobe, presumed pneumonia, with left pleural effusion. Right lung clear. Stable cardiomegaly. Aortic Atherosclerosis (ICD10-I70.0). Electronically Signed   By: Lowella Grip III M.D.   On: 02/23/2019 12:59   Dg Chest Port 1 View  Result Date: 02/24/2019 CLINICAL DATA:  Dyspnea. EXAM: PORTABLE CHEST 1 VIEW COMPARISON:  Radiograph of February 23, 2019. FINDINGS: Stable cardiomegaly. Atherosclerosis of thoracic aorta is noted. No pneumothorax is noted. Mild left basilar atelectasis or infiltrate is noted with small left pleural effusion. Right lung is clear. Bony thorax is unremarkable. IMPRESSION: Mild left basilar atelectasis or infiltrate is noted with small left pleural effusion. Aortic Atherosclerosis (ICD10-I70.0). Electronically Signed   By: Marijo Conception, M.D.   On: 02/24/2019 11:49   US Thoracentesis Asp Pleural Space W/img Guide  Result Date: 02/23/2019 INDICATION: Patient with history of CHF, remote breast cancer, dyspnea, left pleural effusion. Request made for diagnostic and therapeutic left thoracentesis. EXAM: ULTRASOUND GUIDED DIAGNOSTIC AND THERAPEUTIC LEFT THORACENTESIS MEDICATIONS: None COMPLICATIONS: None immediate. PROCEDURE: An ultrasound guided thoracentesis was thoroughly discussed  with the patient and  questions answered. The benefits, risks, alternatives and complications were also discussed. The patient understands and wishes to proceed with the procedure. Written consent was obtained. Ultrasound was performed to localize and mark an adequate pocket of fluid in the left chest. The area was then prepped and draped in the normal sterile fashion. 1% Lidocaine was used for local anesthesia. Under ultrasound guidance a 6 Fr Safe-T-Centesis catheter was introduced. Thoracentesis was performed. The catheter was removed and a dressing applied. FINDINGS: A total of approximately 770 cc of yellow fluid was removed. Samples were sent to the laboratory as requested by the clinical team. Due to patient coughing only the above amount of fluid was removed today. IMPRESSION: Successful ultrasound guided diagnostic and therapeutic left thoracentesis yielding 770 cc of pleural fluid. Read by: Rowe Robert, PA-C Electronically Signed   By: Jacqulynn Cadet M.D.   On: 02/23/2019 15:58    Marzetta Board, MD, PhD [facility]  Contact via  [email or web address]  [place] P: [phone]  F: [phone]

## 2019-02-25 ENCOUNTER — Inpatient Hospital Stay (HOSPITAL_COMMUNITY): Payer: Medicare HMO

## 2019-02-25 DIAGNOSIS — J90 Pleural effusion, not elsewhere classified: Secondary | ICD-10-CM

## 2019-02-25 DIAGNOSIS — J9621 Acute and chronic respiratory failure with hypoxia: Secondary | ICD-10-CM

## 2019-02-25 LAB — BASIC METABOLIC PANEL
Anion gap: 10 (ref 5–15)
BUN: 21 mg/dL (ref 8–23)
CALCIUM: 9 mg/dL (ref 8.9–10.3)
CO2: 38 mmol/L — ABNORMAL HIGH (ref 22–32)
CREATININE: 0.92 mg/dL (ref 0.44–1.00)
Chloride: 84 mmol/L — ABNORMAL LOW (ref 98–111)
GFR calc Af Amer: >60 mL/min (ref >60)
GFR calc non Af Amer: 53 mL/min — ABNORMAL LOW (ref >60)
Glucose, Bld: 98 mg/dL (ref 70–99)
Potassium: 4.9 mmol/L (ref 3.5–5.1)
Sodium: 132 mmol/L — ABNORMAL LOW (ref 135–145)

## 2019-02-25 LAB — CBC
HCT: 36.9 % (ref 36.0–46.0)
Hemoglobin: 12 g/dL (ref 12.0–15.0)
MCH: 32.6 pg (ref 26.0–34.0)
MCHC: 32.5 g/dL (ref 30.0–36.0)
MCV: 100.3 fL — ABNORMAL HIGH (ref 80.0–100.0)
Platelets: 199 10*3/uL (ref 150–400)
RBC: 3.68 MIL/uL — ABNORMAL LOW (ref 3.87–5.11)
RDW: 17.3 % — ABNORMAL HIGH (ref 11.5–15.5)
WBC: 8.7 10*3/uL (ref 4.0–10.5)
nRBC: 0 % (ref 0.0–0.2)

## 2019-02-25 LAB — MAGNESIUM: Magnesium: 1.7 mg/dL (ref 1.7–2.4)

## 2019-02-25 MED ORDER — FUROSEMIDE 10 MG/ML IJ SOLN
20.0000 mg | Freq: Two times a day (BID) | INTRAMUSCULAR | Status: DC
  Administered 2019-02-26: 20 mg via INTRAVENOUS
  Filled 2019-02-25: qty 2

## 2019-02-25 NOTE — Progress Notes (Signed)
PROGRESS NOTE  [name] OBS:[id number] DOB: Feb 01, 1921 DOA: 02/21/2019 PCP: Virgie Dad, MD   LOS: 4 days   Brief narrative: 85 year old female with history of diastolic CHF, hypertension, diet-controlled diabetes mellitus, history of PSVT, history of breast cancer, chronic hypoxic respiratory failure on home O2 was admitted to the hospital on 3/7 with 2 weeks of generalized progressive weakness and increased dyspnea on exertion.  She was found to be significantly fluid overloaded as well as chest x-ray in the ED showed large left-sided pleural effusion.  She was admitted for CHF exacerbation.  Subjective: States that she feels better with breathing but he still has dyspnea and shortness of breath.  Assessment/Plan:  Active Problems:   Acute decompensated heart failure (HCC)  Acute on chronic hypoxic respiratory failure due to acute on chronic diastolic CHF -2D echocardiogram on 02/22/2019 showed normal ejection fraction with EF of 60 to 65% but increased RV systolic pressure.  A CT angiogram of the chest was performed which did not show any evidence of pulmonary embolism or pneumonia.  There was evidence of left-sided pleural effusion.  Patient underwent thoracocentesis with removal of 700 mL of fluid.  Patient has been continued on IV Lasix 40 mg every 12..  Crease the dose of IV Lasix to 20 twice daily.  Will check chest x-ray today to reassess for effusion.  Spoke with the patient's family at bedside who feel like the patient is not ready for discharge.  Pneumonia ruled out.  Mild troponin elevation -Flat, not in a pattern consistent with ACS, no chest pain.  Likely secondary to decompensated CHF.  Klebsiella UTI Completed the course of cefdinir.  Hypokalemia On potassium p.o. twice daily while on diuretics.  Continue with that for now.  Hypertension -Continue atenolol, Lasix, she was added on losartan on admission, blood pressure still on the high side and will keep the  same regimen.  Closely monitor blood pressure.  Hyponatremia -Mild hyponatremia which has remained stable.     VTE Prophylaxis: Lovenox  Code Status: DO NOT RESUSCITATE  Family Communication: Spoke with the patient's son in daughter-in-law at bedside  Disposition Plan: Health nursing facility likely in 1 to 2 days.  Repeat chest x-ray today.  If the effusion has improved and the patient remains stable, possible disposition to skilled nursing facility in a.m.   Consultants:  None  Procedures:  2D echo:  IMPRESSIONS  1. The left ventricle has normal systolic function with an ejection fraction of 60-65%. The cavity size was normal. There is mildly increased left ventricular wall thickness. Left ventricular diastolic Doppler parameters are consistent with  pseudonormalization. There is right ventricular volume and pressure overload. No evidence of left ventricular regional wall motion abnormalities. 2. The right ventricle has mildly reduced systolic function. The cavity was mildly enlarged. There is no increase in right ventricular wall thickness. Right ventricular systolic pressure is severely elevated with an estimated pressure of 74.6 mmHg. 3. Left atrial size was moderately dilated. 4. Right atrial size was mildly dilated. 5. The mitral valve is normal in structure. There is mild mitral annular calcification present. 6. The tricuspid valve is normal in structure. Tricuspid valve regurgitation is mild-moderate. 7. The aortic valve is tricuspid. Aortic valve regurgitation is trivial by color flow Doppler. 8. The aortic root is normal in size and structure.   Ultrasound-guided thoracocentesis on 02/23/2019  Antibiotics: Anti-infectives (From admission, onward)   Start     Dose/Rate Route Frequency Ordered Stop   02/24/19 2015  azithromycin [facility]) tablet 500 mg  Status:  Discontinued     500 mg Oral Daily 02/24/19 2001 02/25/19 1005   02/22/19 1000  cefdinir  (OMNICEF) capsule 300 mg     300 mg Oral Every 12 hours 02/21/19 2036 02/24/19 2321   02/21/19 1900  cefTRIAXone (ROCEPHIN) 1 g in sodium chloride 0.9 % 100 mL IVPB     1 g 200 mL/hr over 30 Minutes Intravenous  Once 02/21/19 1851 02/21/19 1958       Objective: Vitals:   02/25/19 1047 02/25/19 1306  BP:  138/63  Pulse:  62  Resp:  18  Temp:  98.6 F (37 C)  SpO2: 96% 100%    Intake/Output Summary (Last 24 hours) at 02/25/2019 1437 Last data filed at 02/25/2019 1307 Gross per 24 hour  Intake 463 ml  Output 5150 ml  Net -4687 ml   Filed Weights   02/22/19 0519 02/23/19 0539 02/24/19 0433  Weight: 65.4 kg 66.5 kg 65 kg   Body mass index is 25.38 kg/m.   Physical Exam: GENERAL: Patient is alert awake and communicative.  Not in obvious distress.  On nasal cannula. HENT: No scleral pallor or icterus. Pupils equally reactive to light. Oral mucosa is moist NECK: is supple, no palpable thyroid enlargement. CHEST: Diminished breath sounds bilaterally.  No obvious wheezing. CVS: S1 and S2 heard, no murmur. Regular rate and rhythm. No pericardial rub. ABDOMEN: Soft, non-tender, bowel sounds are present. No palpable hepato-splenomegaly. EXTREMITIES: Trace edema noted. CNS: Cranial nerves are intact. No focal motor or sensory deficits. SKIN: warm and dry without rashes.  Data Review: I have personally reviewed the following laboratory data and studies,  CBC: Recent Labs  Lab 02/21/19 1721 02/22/19 0450 02/23/19 0413 02/24/19 0456 02/25/19 0442  WBC 7.5 8.9 9.8 7.4 8.7  NEUTROABS 6.0  --   --   --   --   HGB 12.9 11.7* 11.5* 11.0* 12.0  HCT 40.0 36.1 35.9* 34.7* 36.9  MCV 99.8 99.2 100.3* 102.1* 100.3*  PLT 175 184 190 180 500   Basic Metabolic Panel: Recent Labs  Lab 02/21/19 1721 02/22/19 0450 02/23/19 0413 02/24/19 0456 02/25/19 0442  NA 132* 134* 133* 132* 132*  K 3.3* 3.2* 3.9 4.7 4.9  CL 90* 87* 84* 89* 84*  CO2 30 34* 37* 35* 38*  GLUCOSE 98 97 103* 95  98  BUN 18 19 25* 23 21  CREATININE 0.78 0.81 0.96 0.83 0.92  CALCIUM 8.9 8.7* 8.7* 8.6* 9.0  MG 1.5* 1.9 1.8 1.8 1.7  PHOS 3.0  --   --   --   --    Liver Function Tests: Recent Labs  Lab 02/21/19 1721  AST 54*  ALT 39  ALKPHOS 161*  BILITOT 1.7*  PROT 8.4*  ALBUMIN 3.8   Recent Labs  Lab 02/21/19 1721  LIPASE 39   No results for input(s): AMMONIA in the last 168 hours. Cardiac Enzymes: Recent Labs  Lab 02/21/19 1721 02/22/19 0450 02/22/19 1646  TROPONINI 0.05* 0.10* 0.12*   BNP (last 3 results) Recent Labs    02/21/19 1721  BNP 2,482.2*    ProBNP (last 3 results) No results for input(s): PROBNP in the last 8760 hours.  CBG: No results for input(s): GLUCAP in the last 168 hours. Recent Results (from the past 240 hour(s))  Urine culture     Status: Abnormal   Collection Time: 02/21/19  4:32 PM  Result Value Ref Range Status   Specimen Description  Final    URINE, RANDOM Performed at [facility], [place] [address] [place], [place] [address]    Special Requests   Final    NONE Performed at [facility], [place] [address], [place], [place] [address]    Culture >=100,000 COLONIES/mL KLEBSIELLA SPECIES (A)  Final   Report Status 02/24/2019 FINAL  Final   Organism ID, Bacteria KLEBSIELLA SPECIES (A)  Final      Susceptibility   Klebsiella species - MIC*    AMPICILLIN RESISTANT Resistant     CEFAZOLIN <=4 SENSITIVE Sensitive     CEFTRIAXONE <=1 SENSITIVE Sensitive     CIPROFLOXACIN <=0.25 SENSITIVE Sensitive     GENTAMICIN <=1 SENSITIVE Sensitive     IMIPENEM <=0.25 SENSITIVE Sensitive     NITROFURANTOIN <=16 SENSITIVE Sensitive     TRIMETH/SULFA <=20 SENSITIVE Sensitive     AMPICILLIN/SULBACTAM <=2 SENSITIVE Sensitive     PIP/TAZO <=4 SENSITIVE Sensitive     Extended ESBL NEGATIVE Sensitive     * >=100,000 COLONIES/mL KLEBSIELLA SPECIES  Culture, blood (routine x 2)     Status: None (Preliminary result)    Collection Time: 02/21/19  5:45 PM  Result Value Ref Range Status   Specimen Description   Final    BLOOD RIGHT FOREARM Performed at [facility] [address] [place], [place] [address]    Special Requests   Final    BOTTLES DRAWN AEROBIC AND ANAEROBIC Blood Culture adequate volume Performed at [facility] [address] [place], [place] [address]    Culture   Final    NO GROWTH 4 DAYS Performed at [facility], [place] [address], [place], [place] [address]    Report Status PENDING  Incomplete  Culture, blood (routine x 2)     Status: None (Preliminary result)   Collection Time: 02/21/19  9:40 PM  Result Value Ref Range Status   Specimen Description   Final    BLOOD RIGHT ANTECUBITAL Performed at [facility] [address], [place], [place] [address]    Special Requests   Final    BOTTLES DRAWN AEROBIC ONLY Blood Culture adequate volume Performed at [facility] [address], [place], [place] [address]    Culture   Final    NO GROWTH 4 DAYS Performed at [facility], [place] [address] [place], [place] [address]    Report Status PENDING  Incomplete  MRSA PCR Screening     Status: None   Collection Time: 02/21/19 11:53 PM  Result Value Ref Range Status   MRSA by PCR NEGATIVE NEGATIVE Final    Comment:        The GeneXpert MRSA Assay (FDA approved for NASAL specimens only), is one component of a comprehensive MRSA colonization surveillance program. It is not intended to diagnose MRSA infection nor to guide or monitor treatment for MRSA infections. Performed at [facility], [place] [address], [place],  [address]   Culture, body fluid-bottle     Status: None (Preliminary result)   Collection Time: 02/23/19  4:31 PM  Result Value Ref Range Status   Specimen Description FLUID PLEURAL LEFT  Final   Special Requests BOTTLES DRAWN AEROBIC AND ANAEROBIC  Final    Culture   Final    NO GROWTH 2 DAYS Performed at [facility], [place] [address], [place],  [address]    Report Status PENDING  Incomplete  Gram  stain     Status: None   Collection Time: 02/23/19  4:31 PM  Result Value Ref Range Status   Specimen Description FLUID PLEURAL LEFT  Final   Special Requests NONE  Final   Gram Stain   Final    WBC PRESENT,BOTH PMN AND MONONUCLEAR NO ORGANISMS SEEN CYTOSPIN SMEAR Performed at [facility], [address], [place], [place] [address]    Report Status 02/24/2019 FINAL  Final     Studies: Dg Chest 1 View  Result Date: 02/23/2019 CLINICAL DATA:  Status post thoracentesis. EXAM: CHEST  1 VIEW COMPARISON:  Radiographs of February 23, 2019. FINDINGS: Stable cardiomegaly. Left pleural effusion is significantly smaller status post thoracentesis. No pneumothorax is noted. Right lung is unremarkable. Bony thorax is unremarkable. IMPRESSION: Left pleural effusion is significantly smaller status post thoracentesis. No pneumothorax is noted. Electronically Signed   By: Marijo Conception, M.D.   On: 02/23/2019 16:28   Ct Angio Chest Pe W Or Wo Contrast  Result Date: 02/24/2019 CLINICAL DATA:  85 year old female with acute shortness of breath and elevated D-dimer. History of CHF and breast cancer. EXAM: CT ANGIOGRAPHY CHEST WITH CONTRAST TECHNIQUE: Multidetector CT imaging of the chest was performed using the standard protocol during bolus administration of intravenous contrast. Multiplanar CT image reconstructions and MIPs were obtained to evaluate the vascular anatomy. CONTRAST:  177m OMNIPAQUE IOHEXOL 350 MG/ML SOLN COMPARISON:  02/24/2019 and prior chest radiographs. 02/03/2015 chest CT FINDINGS: Cardiovascular: This is a technically satisfactory study. No pulmonary emboli are identified. Cardiomegaly noted. Aortic atherosclerotic calcifications noted without aneurysm. No pericardial effusion. Mediastinum/Nodes: No enlarged mediastinal, hilar, or  axillary lymph nodes. Thyroid gland, trachea, and esophagus demonstrate no significant findings. Lungs/Pleura: A new moderate to large LEFT pleural effusion is noted with moderate LEFT LOWER lung atelectasis. Mild tree-in-bud opacities within the RIGHT LOWER lobe (series 6: Images 76-86 noted compatible with mild infection. Other scattered peripheral pulmonary opacities do not appear significantly changed from the prior study likely representing areas of scarring. Mild central ground-glass opacities are present and may represent minimal interstitial edema. No pneumothorax. Upper Abdomen: No acute abnormality Musculoskeletal: No acute or suspicious bony abnormalities. Review of the MIP images confirms the above findings. IMPRESSION: 1. No evidence of pulmonary emboli. 2. New moderate to large LEFT pleural effusion with LEFT LOWER lung atelectasis. 3. Mild central ground-glass opacities which may represent mild interstitial edema. 4. Mild tree-in-bud opacities within the RIGHT LOWER lobe likely representing infection. 5. Cardiomegaly and Aortic Atherosclerosis (ICD10-I70.0). Electronically Signed   By: JMargarette CanadaM.D.   On: 02/24/2019 18:05   Dg Chest Port 1 View  Result Date: 02/24/2019 CLINICAL DATA:  Dyspnea. EXAM: PORTABLE CHEST 1 VIEW COMPARISON:  Radiograph of February 23, 2019. FINDINGS: Stable cardiomegaly. Atherosclerosis of thoracic aorta is noted. No pneumothorax is noted. Mild left basilar atelectasis or infiltrate is noted with small left pleural effusion. Right lung is clear. Bony thorax is unremarkable. IMPRESSION: Mild left basilar atelectasis or infiltrate is noted with small left pleural effusion. Aortic Atherosclerosis (ICD10-I70.0). Electronically Signed   By: JMarijo Conception M.D.   On: 02/24/2019 11:49   UKoreaThoracentesis Asp Pleural Space W/img Guide  Result Date: 02/23/2019 INDICATION: Patient with history of CHF, remote breast cancer, dyspnea, left pleural effusion. Request made for  diagnostic and therapeutic left thoracentesis. EXAM: ULTRASOUND GUIDED DIAGNOSTIC AND THERAPEUTIC LEFT THORACENTESIS MEDICATIONS: None COMPLICATIONS: None immediate. PROCEDURE: An ultrasound guided thoracentesis was thoroughly discussed with the patient and questions  answered. The benefits, risks, alternatives and complications were also discussed. The patient understands and wishes to proceed with the procedure. Written consent was obtained. Ultrasound was performed to localize and mark an adequate pocket of fluid in the left chest. The area was then prepped and draped in the normal sterile fashion. 1% Lidocaine was used for local anesthesia. Under ultrasound guidance a 6 Fr Safe-T-Centesis catheter was introduced. Thoracentesis was performed. The catheter was removed and a dressing applied. FINDINGS: A total of approximately 770 cc of yellow fluid was removed. Samples were sent to the laboratory as requested by the clinical team. Due to patient coughing only the above amount of fluid was removed today. IMPRESSION: Successful ultrasound guided diagnostic and therapeutic left thoracentesis yielding 770 cc of pleural fluid. Read by: Rowe Robert, PA-C Electronically Signed   By: Jacqulynn Cadet M.D.   On: 02/23/2019 15:58    Scheduled Meds:  atenolol  25 mg Oral BID   B-complex with vitamin C  1 tablet Oral Daily   dorzolamide-timolol  1 drop Left Eye BID   DULoxetine  30 mg Oral Daily   enoxaparin (LOVENOX) injection  40 mg Subcutaneous Q24H   feeding supplement  237 mL Oral Q lunch   ferrous gluconate  324 mg Oral Q breakfast   folic acid  0.5 mg Oral Daily   furosemide  40 mg Intravenous Q12H   gabapentin  100 mg Oral BID   latanoprost  1 drop Both Eyes QHS   losartan  50 mg Oral Daily   magnesium oxide  400 mg Oral BID   multivitamin  1 tablet Oral Daily   pantoprazole  40 mg Oral Daily   potassium chloride  40 mEq Oral BID   sodium chloride flush  3 mL Intravenous Q12H    sodium chloride  1 g Oral Daily    Continuous Infusions:   Flora Lipps, MD  [facility] 02/25/2019

## 2019-02-25 NOTE — Progress Notes (Signed)
Occupational Therapy Treatment Patient Details Name: [name] MRN: [id number] DOB: 05/23/1921 Today's Date: 02/25/2019    History of present illness 85 year old female with history of diastolic CHF, hypertension, diet-controlled diabetes mellitus, history of PSVT, history of breast cancer, chronic hypoxic respiratory failure on home O2 was admitted to the hospital on 3/7 with 2 weeks of generalized progressive weakness and increased dyspnea on exertion.  She was found to be significantly fluid overloaded as well as chest x-ray in the ED showed large left-sided pleural effusion.  She was admitted for CHF exacerbation.   OT comments  Agreeable to sitting EOB for grooming tasks.  Did not want to sit up in chair today.  Son present during session   Follow Up Recommendations  SNF    Equipment Recommendations  None recommended by OT (3:1 if she doesn't have it)   Recommendations for Other Services      Precautions / Restrictions Precautions Precautions: Fall Precaution Comments: monitor O2 Restrictions Weight Bearing Restrictions: No       Mobility Bed Mobility         Supine to sit: Mod assist Sit to supine: Max assist   General bed mobility comments: HOB raised. Assist for trunk to EOB and for legs/guided trunk back to bed  Transfers                      Balance     Sitting balance-Leahy Scale: (fair to poor; tends to lose balance posteriorl.y)                                     ADL either performed or assessed with clinical judgement   ADL       Grooming: Oral care;Brushing hair;Minimal assistance;Sitting(for full set up and min guard to min A for balance)                                 General ADL Comments: sat eob x 15 min with min A;min guard.  Performed above activities. Set up for tray when back to bed     Vision       Perception     Praxis      Cognition Arousal/Alertness: Awake/alert Behavior During  Therapy: [facility] for tasks assessed/performed Overall Cognitive Status: Within Functional Limits for tasks assessed                                          Exercises     Shoulder Instructions       General Comments      Pertinent Vitals/ Pain       Pain Assessment: Faces Faces Pain Scale: Hurts even more Pain Location: back, LLE Pain Intervention(s): Limited activity within patient's tolerance;Monitored during session;Repositioned  Home Living                                          Prior Functioning/Environment              Frequency  Min 2X/week        Progress Toward Goals  OT Goals(current goals can now be found in the  care plan section)  Progress towards OT goals: Progressing toward goals     Plan      Co-evaluation                 AM-PAC OT "6 Clicks" Daily Activity     Outcome Measure   Help from another person eating meals?: A Little Help from another person taking care of personal grooming?: A Little Help from another person toileting, which includes using toliet, bedpan, or urinal?: A Lot Help from another person bathing (including washing, rinsing, drying)?: A Lot Help from another person to put on and taking off regular upper body clothing?: A Little Help from another person to put on and taking off regular lower body clothing?: Total 6 Click Score: 14    End of Session    OT Visit Diagnosis: Muscle weakness (generalized) (M62.81)   Activity Tolerance Patient tolerated treatment well   Patient Left in bed;with call bell/phone within reach;with bed alarm set;with family/visitor present   Nurse Communication          Time: 9494-4739 OT Time Calculation (min): 31 min  Charges: OT General Charges $OT Visit: 1 Visit OT Treatments $Self Care/Home Management : 8-22 mins $Therapeutic Activity: 8-22 mins  Lesle Chris, OTR/L Acute Rehabilitation Services [phone] [facility] pager [phone]  office 02/25/2019   [place] 02/25/2019, 2:55 PM

## 2019-02-26 LAB — MAGNESIUM: Magnesium: 1.7 mg/dL (ref 1.7–2.4)

## 2019-02-26 LAB — CULTURE, BLOOD (ROUTINE X 2)
Culture: NO GROWTH
Culture: NO GROWTH
Special Requests: ADEQUATE
Special Requests: ADEQUATE

## 2019-02-26 LAB — BASIC METABOLIC PANEL
Anion gap: 9 (ref 5–15)
BUN: 21 mg/dL (ref 8–23)
CO2: 37 mmol/L — ABNORMAL HIGH (ref 22–32)
Calcium: 8.9 mg/dL (ref 8.9–10.3)
Chloride: 83 mmol/L — ABNORMAL LOW (ref 98–111)
Creatinine, Ser: 0.92 mg/dL (ref 0.44–1.00)
GFR calc Af Amer: >60 mL/min (ref >60)
GFR, EST NON AFRICAN AMERICAN: 53 mL/min — AB (ref >60)
Glucose, Bld: 89 mg/dL (ref 70–99)
POTASSIUM: 5.2 mmol/L — AB (ref 3.5–5.1)
Sodium: 129 mmol/L — ABNORMAL LOW (ref 135–145)

## 2019-02-26 LAB — CBC
HCT: 38.8 % (ref 36.0–46.0)
HEMOGLOBIN: 12.4 g/dL (ref 12.0–15.0)
MCH: 32 pg (ref 26.0–34.0)
MCHC: 32 g/dL (ref 30.0–36.0)
MCV: 100.3 fL — ABNORMAL HIGH (ref 80.0–100.0)
Platelets: 218 10*3/uL (ref 150–400)
RBC: 3.87 MIL/uL (ref 3.87–5.11)
RDW: 17.1 % — ABNORMAL HIGH (ref 11.5–15.5)
WBC: 9.7 10*3/uL (ref 4.0–10.5)
nRBC: 0 % (ref 0.0–0.2)

## 2019-02-26 LAB — OCCULT BLOOD X 1 CARD TO LAB, STOOL: Fecal Occult Bld: NEGATIVE

## 2019-02-26 MED ORDER — FUROSEMIDE 10 MG/ML IJ SOLN: 20.0000 mg | Freq: Two times a day (BID) | INTRAMUSCULAR | Status: DC

## 2019-02-26 NOTE — Progress Notes (Signed)
Horticulturist, commercial pending for SNF rehab placement at [facility].   Kathrin Greathouse, Marlinda Mike, MSW Clinical Social Worker  [phone] 02/26/2019  10:18 AM

## 2019-02-26 NOTE — Progress Notes (Signed)
PROGRESS NOTE  [name] GEZ:[id number] DOB: May 16, 1921 DOA: 02/21/2019 PCP: Virgie Dad, MD   LOS: 5 days   Brief narrative: 85 year old female with history of diastolic CHF, hypertension, diet-controlled diabetes mellitus, history of PSVT, history of breast cancer, chronic hypoxic respiratory failure on home O2 was admitted to the hospital on 3/7 with 2 weeks of generalized progressive weakness and increased dyspnea on exertion.  She was found to be significantly fluid overloaded as well as chest x-ray in the ED showed large left-sided pleural effusion.    Patient underwent thoracocentesis with removal of 700 mL of fluid which was transudative in nature.  She was continued on IV diuretics with improvement in her symptoms.  Subjective: Patient denies interval complaints.  She has overall improved breathing.  No cough, fever chills or chest pain.  Assessment/Plan:  Active Problems:   Acute decompensated heart failure (HCC)  Acute on chronic hypoxic respiratory failure due to acute on chronic diastolic CHF -2D echocardiogram on 02/22/2019 showed normal ejection fraction with EF of 60 to 65% but increased RV systolic pressure.  A CT angiogram of the chest was performed which did not show any evidence of pulmonary embolism or pneumonia.  There was evidence of left-sided pleural effusion.  Patient underwent thoracocentesis with removal of 700 mL of fluid.  Will hold Lasix dose today due to hyponatremia.  Hold off with potassium supplements as well.  Restart p.o. medications starting tomorrow a.m.Marland Kitchen  Pneumonia ruled out.  Significant weight loss documented in the computer from 147 to 126 pounds.  She needs a total of 977 3 mL negative balance.  Mild troponin elevation -Flat, not in a pattern consistent with ACS, no chest pain.  Likely secondary to decompensated CHF.  Klebsiella UTI Completed the course of cefdinir.  Hypokalemia Resolved.  Hold off with further potassium doses.     Hypertension -Continue atenolol, losartan. Closely monitor blood pressure.  Hyponatremia -We will hold off with IV diuretics.  Closely monitor.    VTE Prophylaxis: Lovenox  Code Status: DO NOT RESUSCITATE  Family Communication: I again spoke with the patient's family at bedside today.  Disposition Plan:  SNF likely in 1 to 2 days.  Insurance authorization pending.  Labs in a.m.  Patient is medical stable for disposition.   Consultants:  None  Procedures:  2D echo:  IMPRESSIONS  1. The left ventricle has normal systolic function with an ejection fraction of 60-65%. The cavity size was normal. There is mildly increased left ventricular wall thickness. Left ventricular diastolic Doppler parameters are consistent with  pseudonormalization. There is right ventricular volume and pressure overload. No evidence of left ventricular regional wall motion abnormalities. 2. The right ventricle has mildly reduced systolic function. The cavity was mildly enlarged. There is no increase in right ventricular wall thickness. Right ventricular systolic pressure is severely elevated with an estimated pressure of 74.6 mmHg. 3. Left atrial size was moderately dilated. 4. Right atrial size was mildly dilated. 5. The mitral valve is normal in structure. There is mild mitral annular calcification present. 6. The tricuspid valve is normal in structure. Tricuspid valve regurgitation is mild-moderate. 7. The aortic valve is tricuspid. Aortic valve regurgitation is trivial by color flow Doppler. 8. The aortic root is normal in size and structure.   Ultrasound-guided thoracocentesis on 02/23/2019  Antibiotics: Anti-infectives (From admission, onward)   Start     Dose/Rate Route Frequency Ordered Stop   02/24/19 2015  azithromycin (ZITHROMAX) tablet 500 mg  Status:  Discontinued  500 mg Oral Daily 02/24/19 2001 02/25/19 1005   02/22/19 1000  cefdinir (OMNICEF) capsule 300 mg     300 mg Oral  Every 12 hours 02/21/19 2036 02/24/19 2321   02/21/19 1900  cefTRIAXone (ROCEPHIN) 1 g in sodium chloride 0.9 % 100 mL IVPB     1 g 200 mL/hr over 30 Minutes Intravenous  Once 02/21/19 1851 02/21/19 1958      Objective: Vitals:   02/26/19 0820 02/26/19 1150  BP: 129/63 (!) 162/73  Pulse: 66 69  Resp: 16 20  Temp: 98.3 F (36.8 C) 98.9 F (37.2 C)  SpO2: 97% 98%    Intake/Output Summary (Last 24 hours) at 02/26/2019 1312 Last data filed at 02/26/2019 1030 Gross per 24 hour  Intake 260 ml  Output 1800 ml  Net -1540 ml   Filed Weights   02/23/19 0539 02/24/19 0433 02/26/19 0600  Weight: 66.5 kg 65 kg 57.5 kg   Body mass index is 22.46 kg/m.   Physical Exam: General:  Average built, not in obvious distress.  On nasal cannula HENT: Normocephalic, pupils equally reacting to light and accommodation.  No scleral pallor or icterus noted. Oral mucosa is moist.  Chest:  Diminished breath sounds bilaterally. No crackles or wheezes.  CVS: S1 &S2 heard. No murmur.  Regular rate and rhythm. Abdomen: Soft, nontender, nondistended.  Bowel sounds are heard.  Liver is not palpable, no abdominal mass palpated Extremities: No cyanosis, clubbing but trace edema noted, peripheral pulses are palpable. Psych: Alert, awake and oriented, normal mood CNS:  No cranial nerve deficits.  Power equal in all extremities.  No sensory deficits noted.  No cerebellar signs.   Skin: Warm and dry.  No rashes noted.  Data Review: I have personally reviewed the following laboratory data and studies,  CBC: Recent Labs  Lab 02/21/19 1721 02/22/19 0450 02/23/19 0413 02/24/19 0456 02/25/19 0442 02/26/19 0348  WBC 7.5 8.9 9.8 7.4 8.7 9.7  NEUTROABS 6.0  --   --   --   --   --   HGB 12.9 11.7* 11.5* 11.0* 12.0 12.4  HCT 40.0 36.1 35.9* 34.7* 36.9 38.8  MCV 99.8 99.2 100.3* 102.1* 100.3* 100.3*  PLT 175 184 190 180 199 616   Basic Metabolic Panel: Recent Labs  Lab 02/21/19 1721 02/22/19 0450 02/23/19  0413 02/24/19 0456 02/25/19 0442 02/26/19 0348  NA 132* 134* 133* 132* 132* 129*  K 3.3* 3.2* 3.9 4.7 4.9 5.2*  CL 90* 87* 84* 89* 84* 83*  CO2 30 34* 37* 35* 38* 37*  GLUCOSE 98 97 103* 95 98 89  BUN 18 19 25* 23 21 21   CREATININE 0.78 0.81 0.96 0.83 0.92 0.92  CALCIUM 8.9 8.7* 8.7* 8.6* 9.0 8.9  MG 1.5* 1.9 1.8 1.8 1.7 1.7  PHOS 3.0  --   --   --   --   --    Liver Function Tests: Recent Labs  Lab 02/21/19 1721  AST 54*  ALT 39  ALKPHOS 161*  BILITOT 1.7*  PROT 8.4*  ALBUMIN 3.8   Recent Labs  Lab 02/21/19 1721  LIPASE 39   No results for input(s): AMMONIA in the last 168 hours. Cardiac Enzymes: Recent Labs  Lab 02/21/19 1721 02/22/19 0450 02/22/19 1646  TROPONINI 0.05* 0.10* 0.12*   BNP (last 3 results) Recent Labs    02/21/19 1721  BNP 2,482.2*    ProBNP (last 3 results) No results for input(s): PROBNP in the last 8760 hours.  CBG: No results for input(s): GLUCAP in the last 168 hours. Recent Results (from the past 240 hour(s))  Urine culture     Status: Abnormal   Collection Time: 02/21/19  4:32 PM  Result Value Ref Range Status   Specimen Description   Final    URINE, RANDOM Performed at [facility] [address], [place], [place] [address]    Special Requests   Final    NONE Performed at [facility], [place] [address], [place], [place] [address]    Culture >=100,000 COLONIES/mL KLEBSIELLA SPECIES (A)  Final   Report Status 02/24/2019 FINAL  Final   Organism ID, Bacteria KLEBSIELLA SPECIES (A)  Final      Susceptibility   Klebsiella species - MIC*    AMPICILLIN RESISTANT Resistant     CEFAZOLIN <=4 SENSITIVE Sensitive     CEFTRIAXONE <=1 SENSITIVE Sensitive     CIPROFLOXACIN <=0.25 SENSITIVE Sensitive     GENTAMICIN <=1 SENSITIVE Sensitive     IMIPENEM <=0.25 SENSITIVE Sensitive     NITROFURANTOIN <=16 SENSITIVE Sensitive     TRIMETH/SULFA <=20 SENSITIVE Sensitive     AMPICILLIN/SULBACTAM <=2  SENSITIVE Sensitive     PIP/TAZO <=4 SENSITIVE Sensitive     Extended ESBL NEGATIVE Sensitive     * >=100,000 COLONIES/mL KLEBSIELLA SPECIES  Culture, blood (routine x 2)     Status: None   Collection Time: 02/21/19  5:45 PM  Result Value Ref Range Status   Specimen Description   Final    BLOOD RIGHT FOREARM Performed at [facility] [address] [place], [place] [address]    Special Requests   Final    BOTTLES DRAWN AEROBIC AND ANAEROBIC Blood Culture adequate volume Performed at [facility] [address] [place], [place] [address]    Culture   Final    NO GROWTH 5 DAYS Performed at [facility], [place] [address], [place], [place] [address]    Report Status 02/26/2019 FINAL  Final  Culture, blood (routine x 2)     Status: None   Collection Time: 02/21/19  9:40 PM  Result Value Ref Range Status   Specimen Description   Final    BLOOD RIGHT ANTECUBITAL Performed at [facility] [address], [place], [place] [address]    Special Requests   Final    BOTTLES DRAWN AEROBIC ONLY Blood Culture adequate volume Performed at [facility] [address], [place], [place] [address]    Culture   Final    NO GROWTH 5 DAYS Performed at [facility], [place] [address], [place],  [address]    Report Status 02/26/2019 FINAL  Final  MRSA PCR Screening     Status: None   Collection Time: 02/21/19 11:53 PM  Result Value Ref Range Status   MRSA by PCR NEGATIVE NEGATIVE Final    Comment:        The GeneXpert MRSA Assay (FDA approved for NASAL specimens only), is one component of a comprehensive MRSA colonization surveillance program. It is not intended to diagnose MRSA infection nor to guide or monitor treatment for MRSA infections. Performed at [facility], [place] [address] [place],  [address]   Culture, body fluid-bottle     Status: None (Preliminary result)    Collection Time: 02/23/19  4:31 PM  Result Value Ref Range Status   Specimen Description FLUID PLEURAL LEFT  Final  Special Requests BOTTLES DRAWN AEROBIC AND ANAEROBIC  Final   Culture   Final    NO GROWTH 3 DAYS Performed at [facility], [place] [address] [place], [place] [address]    Report Status PENDING  Incomplete  Gram stain     Status: None   Collection Time: 02/23/19  4:31 PM  Result Value Ref Range Status   Specimen Description FLUID PLEURAL LEFT  Final   Special Requests NONE  Final   Gram Stain   Final    WBC PRESENT,BOTH PMN AND MONONUCLEAR NO ORGANISMS SEEN CYTOSPIN SMEAR Performed at [facility], [address] [place], [place] [address]    Report Status 02/24/2019 FINAL  Final     Studies: Ct Angio Chest Pe W Or Wo Contrast  Result Date: 02/24/2019 CLINICAL DATA:  85 year old female with acute shortness of breath and elevated D-dimer. History of CHF and breast cancer. EXAM: CT ANGIOGRAPHY CHEST WITH CONTRAST TECHNIQUE: Multidetector CT imaging of the chest was performed using the standard protocol during bolus administration of intravenous contrast. Multiplanar CT image reconstructions and MIPs were obtained to evaluate the vascular anatomy. CONTRAST:  141m OMNIPAQUE IOHEXOL 350 MG/ML SOLN COMPARISON:  02/24/2019 and prior chest radiographs. 02/03/2015 chest CT FINDINGS: Cardiovascular: This is a technically satisfactory study. No pulmonary emboli are identified. Cardiomegaly noted. Aortic atherosclerotic calcifications noted without aneurysm. No pericardial effusion. Mediastinum/Nodes: No enlarged mediastinal, hilar, or axillary lymph nodes. Thyroid gland, trachea, and esophagus demonstrate no significant findings. Lungs/Pleura: A new moderate to large LEFT pleural effusion is noted with moderate LEFT LOWER lung atelectasis. Mild tree-in-bud opacities within the RIGHT LOWER lobe (series 6: Images 76-86 noted compatible with mild infection. Other scattered  peripheral pulmonary opacities do not appear significantly changed from the prior study likely representing areas of scarring. Mild central ground-glass opacities are present and may represent minimal interstitial edema. No pneumothorax. Upper Abdomen: No acute abnormality Musculoskeletal: No acute or suspicious bony abnormalities. Review of the MIP images confirms the above findings. IMPRESSION: 1. No evidence of pulmonary emboli. 2. New moderate to large LEFT pleural effusion with LEFT LOWER lung atelectasis. 3. Mild central ground-glass opacities which may represent mild interstitial edema. 4. Mild tree-in-bud opacities within the RIGHT LOWER lobe likely representing infection. 5. Cardiomegaly and Aortic Atherosclerosis (ICD10-I70.0). Electronically Signed   By: JMargarette CanadaM.D.   On: 02/24/2019 18:05   Dg Chest Port 1 View  Result Date: 02/25/2019 CLINICAL DATA:  CHF EXAM: PORTABLE CHEST 1 VIEW COMPARISON:  02/24/2019 FINDINGS: Cardiac shadow is stable. Aortic calcifications are noted. Left pleural effusion is again noted similar to that seen on the prior exam. Underlying atelectatic changes are present. The right lung remains clear. IMPRESSION: Stable appearance of left pleural effusion and basilar atelectasis. Electronically Signed   By: MInez CatalinaM.D.   On: 02/25/2019 16:08    Scheduled Meds: . atenolol  25 mg Oral BID  . B-complex with vitamin C  1 tablet Oral Daily  . dorzolamide-timolol  1 drop Left Eye BID  . DULoxetine  30 mg Oral Daily  . enoxaparin (LOVENOX) injection  40 mg Subcutaneous Q24H  . feeding supplement  237 mL Oral Q lunch  . ferrous gluconate  324 mg Oral Q breakfast  . folic acid  0.5 mg Oral Daily  . furosemide  20 mg Intravenous BID  . gabapentin  100 mg Oral BID  . latanoprost  1 drop Both Eyes QHS  . losartan  50 mg  Oral Daily  . magnesium oxide  400 mg Oral BID  . multivitamin  1 tablet Oral Daily  . pantoprazole  40 mg Oral Daily  . sodium chloride flush  3  mL Intravenous Q12H  . sodium chloride  1 g Oral Daily    Continuous Infusions:   Flora Lipps, MD  [facility] 02/26/2019

## 2019-02-26 NOTE — Progress Notes (Signed)
Pt had BM with possible blood in it. MD paged. Orders received.

## 2019-02-27 LAB — CBC
HCT: 41 % (ref 36.0–46.0)
Hemoglobin: 12.9 g/dL (ref 12.0–15.0)
MCH: 32 pg (ref 26.0–34.0)
MCHC: 31.5 g/dL (ref 30.0–36.0)
MCV: 101.7 fL — ABNORMAL HIGH (ref 80.0–100.0)
Platelets: 218 10*3/uL (ref 150–400)
RBC: 4.03 MIL/uL (ref 3.87–5.11)
RDW: 16.6 % — ABNORMAL HIGH (ref 11.5–15.5)
WBC: 9.9 10*3/uL (ref 4.0–10.5)
nRBC: 0 % (ref 0.0–0.2)

## 2019-02-27 LAB — BASIC METABOLIC PANEL
Anion gap: 10 (ref 5–15)
BUN: 29 mg/dL — ABNORMAL HIGH (ref 8–23)
CO2: 33 mmol/L — ABNORMAL HIGH (ref 22–32)
Calcium: 9.1 mg/dL (ref 8.9–10.3)
Chloride: 85 mmol/L — ABNORMAL LOW (ref 98–111)
Creatinine, Ser: 1.07 mg/dL — ABNORMAL HIGH (ref 0.44–1.00)
GFR calc Af Amer: 51 mL/min — ABNORMAL LOW (ref >60)
GFR calc non Af Amer: 44 mL/min — ABNORMAL LOW (ref >60)
Glucose, Bld: 91 mg/dL (ref 70–99)
Potassium: 4.8 mmol/L (ref 3.5–5.1)
Sodium: 128 mmol/L — ABNORMAL LOW (ref 135–145)

## 2019-02-27 LAB — MAGNESIUM: Magnesium: 1.6 mg/dL — ABNORMAL LOW (ref 1.7–2.4)

## 2019-02-27 MED ORDER — SODIUM CHLORIDE 1 G PO TABS
1.0000 g | ORAL_TABLET | Freq: Two times a day (BID) | ORAL | Status: DC
  Administered 2019-02-27 – 2019-02-28 (×3): 1 g via ORAL
  Filled 2019-02-27 (×3): qty 1

## 2019-02-27 MED ORDER — SODIUM CHLORIDE 0.9 % IV SOLN
INTRAVENOUS | Status: AC
  Administered 2019-02-27: 12:00:00 via INTRAVENOUS

## 2019-02-27 MED ORDER — MAGNESIUM OXIDE 400 (241.3 MG) MG PO TABS
400.0000 mg | ORAL_TABLET | Freq: Two times a day (BID) | ORAL | Status: DC
  Administered 2019-02-27 – 2019-03-05 (×14): 400 mg via ORAL
  Filled 2019-02-27 (×14): qty 1

## 2019-02-27 NOTE — Progress Notes (Signed)
Patient has been drowsy this am but easy to arouse. Pt is A&O self only at this time. Family reports baseline prior to inpatient status was A&Ox4. Discussed with hospitalist provider this am whom reports this as no change from 02/26/19. VS remain stable. Will continue to monitor.

## 2019-02-27 NOTE — Progress Notes (Addendum)
PROGRESS NOTE  [name] GQB:[id number] DOB: 12-26-1920 DOA: 02/21/2019 PCP: Virgie Dad, MD   LOS: 6 days   Brief narrative:\  85 year old female with history of diastolic CHF, hypertension, diet-controlled diabetes mellitus, history of PSVT, history of breast cancer, chronic hypoxic respiratory failure on home O2 was admitted to the hospital on 3/7 with 2 weeks of generalized progressive weakness and increased dyspnea on exertion.  She was found to be significantly fluid overloaded as well as chest x-ray in the ED showed large left-sided pleural effusion.    Patient underwent thoracocentesis with removal of 700 mL of fluid which was transudative in nature.  She was continued on IV diuretics with improvement in her symptoms.  Subjective: Patient family at bedside reported that she was a little sluggish today.  Patient however denies any interval symptoms.  She denies any chest pain palpitation fever or shortness of breath.  Patient however felt thirsty.  Assessment/Plan:  Active Problems:   Acute decompensated heart failure (HCC)  Acute on chronic hypoxic respiratory failure due to acute on chronic diastolic CHF -2D echocardiogram on 02/22/2019 showed normal ejection fraction with EF of 60 to 65% but increased RV systolic pressure.  A CT angiogram of the chest was performed which did not show any evidence of pulmonary embolism or pneumonia.  There was evidence of left-sided pleural effusion.  Patient underwent thoracocentesis with removal of 700 mL of fluid.  Lasix on hold due to hyponatremia and slight overdiuresisPneumonia ruled out.  Significant weight loss documented in the computer from 147 to 124 pounds.    Mild volume depletion likely secondary to significant diuresis..  Significant diuresis achieved.  Continue to hold Lasix for now.  Mild impaired renal function.  Will provide gentle IV fluid hydration.  Patient was encouraged to drink more fluids today.  We will check BMP in a.m.   Mild acute kidney injury likely from volume depletion.  Replenish IV fluid today.  Mild troponin elevation -Flat, not in a pattern consistent with ACS, no chest pain.  Likely secondary to decompensated CHF.  Klebsiella UTI Completed the course of cefdinir.  Hypokalemia Resolved.  Hold off with further potassium doses.    Hypertension -Continue atenolol, losartan. Closely monitor blood pressure.  Hyponatremia -We will hold off with IV diuretics.  Will provide gentle IV fluid hydration today.  Continue sodium chloride tablets.    VTE Prophylaxis:  SCD  Code Status: DO NOT RESUSCITATE  Family Communication: I had a prolonged discussion with the patient's daughter in law at bedside and updated her about the clinical condition of the patient.  Disposition Plan:  SNF likely in 1 to 2 days.  Insurance authorization pending.  Labs in a.m. we will continue gentle IV fluid hydration.  Check BMP in a.m.  Consultants:  None  Procedures:  2D echo:    IMPRESSIONS  1. The left ventricle has normal systolic function with an ejection fraction of 60-65%. The cavity size was normal. There is mildly increased left ventricular wall thickness. Left ventricular diastolic Doppler parameters are consistent with  pseudonormalization. There is right ventricular volume and pressure overload. No evidence of left ventricular regional wall motion abnormalities. 2. The right ventricle has mildly reduced systolic function. The cavity was mildly enlarged. There is no increase in right ventricular wall thickness. Right ventricular systolic pressure is severely elevated with an estimated pressure of 74.6 mmHg. 3. Left atrial size was moderately dilated. 4. Right atrial size was mildly dilated. 5. The mitral valve is  normal in structure. There is mild mitral annular calcification present. 6. The tricuspid valve is normal in structure. Tricuspid valve regurgitation is mild-moderate. 7. The aortic  valve is tricuspid. Aortic valve regurgitation is trivial by color flow Doppler. 8. The aortic root is normal in size and structure.   Ultrasound-guided thoracocentesis on 02/23/2019  Antibiotics: Anti-infectives (From admission, onward)   Start     Dose/Rate Route Frequency Ordered Stop   02/24/19 2015  azithromycin (ZITHROMAX) tablet 500 mg  Status:  Discontinued     500 mg Oral Daily 02/24/19 2001 02/25/19 1005   02/22/19 1000  cefdinir (OMNICEF) capsule 300 mg     300 mg Oral Every 12 hours 02/21/19 2036 02/24/19 2321   02/21/19 1900  cefTRIAXone (ROCEPHIN) 1 g in sodium chloride 0.9 % 100 mL IVPB     1 g 200 mL/hr over 30 Minutes Intravenous  Once 02/21/19 1851 02/21/19 1958      Objective: Vitals:   02/27/19 1116 02/27/19 1216  BP: (!) 153/76 (!) 148/56  Pulse: 64 63  Resp:  19  Temp:  97.7 F (36.5 C)  SpO2: 97% 94%    Intake/Output Summary (Last 24 hours) at 02/27/2019 1523 Last data filed at 02/27/2019 1217 Gross per 24 hour  Intake 423 ml  Output 600 ml  Net -177 ml   Filed Weights   02/24/19 0433 02/26/19 0600 02/27/19 0548  Weight: 65 kg 57.5 kg 56.6 kg   Body mass index is 22.1 kg/m.   Physical Exam: General:  Average built, not in obvious distress.  Awake and communicative.  Slightly slow to respond today.  On nasal cannula HENT: Normocephalic, pupils equally reacting to light and accommodation.  No scleral pallor or icterus noted. Oral mucosa is dry Chest:  Diminished breath sounds bilaterally.  No obvious or wheezes noted. CVS: S1 &S2 heard. No murmur.  Regular rate and rhythm. Abdomen: Soft, nontender, nondistended.  Bowel sounds are heard.  Liver is not palpable, no abdominal mass palpated Extremities: No cyanosis, clubbing. peripheral pulses are palpable. Psych: Alert, awake and oriented, normal mood CNS:  No cranial nerve deficits.  Power equal in all extremities.  No sensory deficits noted.  No cerebellar signs.   Skin: Warm and dry.  No rashes  noted.  Diminished skin turgor  Data Review: I have personally reviewed the following laboratory data and studies,  CBC: Recent Labs  Lab 02/21/19 1721  02/23/19 0413 02/24/19 0456 02/25/19 0442 02/26/19 0348 02/27/19 0345  WBC 7.5   < > 9.8 7.4 8.7 9.7 9.9  NEUTROABS 6.0  --   --   --   --   --   --   HGB 12.9   < > 11.5* 11.0* 12.0 12.4 12.9  HCT 40.0   < > 35.9* 34.7* 36.9 38.8 41.0  MCV 99.8   < > 100.3* 102.1* 100.3* 100.3* 101.7*  PLT 175   < > 190 180 199 218 218   < > = values in this interval not displayed.   Basic Metabolic Panel: Recent Labs  Lab 02/21/19 1721  02/23/19 0413 02/24/19 0456 02/25/19 0442 02/26/19 0348 02/27/19 0345  NA 132*   < > 133* 132* 132* 129* 128*  K 3.3*   < > 3.9 4.7 4.9 5.2* 4.8  CL 90*   < > 84* 89* 84* 83* 85*  CO2 30   < > 37* 35* 38* 37* 33*  GLUCOSE 98   < > 103* 95 98 89  91  BUN 18   < > 25* 23 21 21  29*  CREATININE 0.78   < > 0.96 0.83 0.92 0.92 1.07*  CALCIUM 8.9   < > 8.7* 8.6* 9.0 8.9 9.1  MG 1.5*   < > 1.8 1.8 1.7 1.7 1.6*  PHOS 3.0  --   --   --   --   --   --    < > = values in this interval not displayed.   Liver Function Tests: Recent Labs  Lab 02/21/19 1721  AST 54*  ALT 39  ALKPHOS 161*  BILITOT 1.7*  PROT 8.4*  ALBUMIN 3.8   Recent Labs  Lab 02/21/19 1721  LIPASE 39   No results for input(s): AMMONIA in the last 168 hours. Cardiac Enzymes: Recent Labs  Lab 02/21/19 1721 02/22/19 0450 02/22/19 1646  TROPONINI 0.05* 0.10* 0.12*   BNP (last 3 results) Recent Labs    02/21/19 1721  BNP 2,482.2*    ProBNP (last 3 results) No results for input(s): PROBNP in the last 8760 hours.  CBG: No results for input(s): GLUCAP in the last 168 hours. Recent Results (from the past 240 hour(s))  Urine culture     Status: Abnormal   Collection Time: 02/21/19  4:32 PM  Result Value Ref Range Status   Specimen Description   Final    URINE, RANDOM Performed at [facility]  [address] [place], [place] [address]    Special Requests   Final    NONE Performed at [facility], [place] [address], [place], [place] [address]    Culture >=100,000 COLONIES/mL KLEBSIELLA SPECIES (A)  Final   Report Status 02/24/2019 FINAL  Final   Organism ID, Bacteria KLEBSIELLA SPECIES (A)  Final      Susceptibility   Klebsiella species - MIC*    AMPICILLIN RESISTANT Resistant     CEFAZOLIN <=4 SENSITIVE Sensitive     CEFTRIAXONE <=1 SENSITIVE Sensitive     CIPROFLOXACIN <=0.25 SENSITIVE Sensitive     GENTAMICIN <=1 SENSITIVE Sensitive     IMIPENEM <=0.25 SENSITIVE Sensitive     NITROFURANTOIN <=16 SENSITIVE Sensitive     TRIMETH/SULFA <=20 SENSITIVE Sensitive     AMPICILLIN/SULBACTAM <=2 SENSITIVE Sensitive     PIP/TAZO <=4 SENSITIVE Sensitive     Extended ESBL NEGATIVE Sensitive     * >=100,000 COLONIES/mL KLEBSIELLA SPECIES  Culture, blood (routine x 2)     Status: None   Collection Time: 02/21/19  5:45 PM  Result Value Ref Range Status   Specimen Description   Final    BLOOD RIGHT FOREARM Performed at [facility] [address], [place], [place] [address]    Special Requests   Final    BOTTLES DRAWN AEROBIC AND ANAEROBIC Blood Culture adequate volume Performed at [facility] [address], [place], [place] [address]    Culture   Final    NO GROWTH 5 DAYS Performed at [facility], [place] [address], [place], [place] [address]    Report Status 02/26/2019 FINAL  Final  Culture, blood (routine x 2)     Status: None   Collection Time: 02/21/19  9:40 PM  Result Value Ref Range Status   Specimen Description   Final    BLOOD RIGHT ANTECUBITAL Performed at [facility] [address] [place], [place] [address]    Special Requests   Final    BOTTLES DRAWN AEROBIC ONLY  Blood Culture adequate volume Performed at [facility] [address] [place], [place] [address]     Culture   Final    NO GROWTH 5 DAYS Performed at [facility], [place] [address] [place], [place] [address]    Report Status 02/26/2019 FINAL  Final  MRSA PCR Screening     Status: None   Collection Time: 02/21/19 11:53 PM  Result Value Ref Range Status   MRSA by PCR NEGATIVE NEGATIVE Final    Comment:        The GeneXpert MRSA Assay (FDA approved for NASAL specimens only), is one component of a comprehensive MRSA colonization surveillance program. It is not intended to diagnose MRSA infection nor to guide or monitor treatment for MRSA infections. Performed at [facility], [place] [address], [place], [place] [address]   Culture, body fluid-bottle     Status: None (Preliminary result)   Collection Time: 02/23/19  4:31 PM  Result Value Ref Range Status   Specimen Description FLUID PLEURAL LEFT  Final   Special Requests BOTTLES DRAWN AEROBIC AND ANAEROBIC  Final   Culture   Final    NO GROWTH 3 DAYS Performed at [facility], [place] [address] [place], [place] [address]    Report Status PENDING  Incomplete  Gram stain     Status: None   Collection Time: 02/23/19  4:31 PM  Result Value Ref Range Status   Specimen Description FLUID PLEURAL LEFT  Final   Special Requests NONE  Final   Gram Stain   Final    WBC PRESENT,BOTH PMN AND MONONUCLEAR NO ORGANISMS SEEN CYTOSPIN SMEAR Performed at [facility], [address] [place], [place] [address]    Report Status 02/24/2019 FINAL  Final     Studies: No results found.  Scheduled Meds: . atenolol  25 mg Oral BID  . B-complex with vitamin C  1 tablet Oral Daily  . dorzolamide-timolol  1 drop Left Eye BID  . DULoxetine  30 mg Oral Daily  . feeding supplement  237 mL Oral Q lunch  . ferrous gluconate  324 mg Oral Q breakfast  . folic acid  0.5 mg Oral Daily  . gabapentin  100 mg Oral BID  . latanoprost  1 drop Both Eyes QHS  . losartan  50 mg Oral Daily  . magnesium oxide  400 mg Oral BID   . multivitamin  1 tablet Oral Daily  . pantoprazole  40 mg Oral Daily  . sodium chloride flush  3 mL Intravenous Q12H  . sodium chloride  1 g Oral BID WC    Continuous Infusions: . sodium chloride 75 mL/hr at 02/27/19 1149     Flora Lipps, MD  [facility] 02/27/2019

## 2019-02-27 NOTE — Progress Notes (Signed)
Physical Therapy Treatment Patient Details Name: [name] MRN: [id number] DOB: 31-Aug-1921 Today's Date: 02/27/2019    History of Present Illness 85 year old female with history of diastolic CHF, hypertension, diet-controlled diabetes mellitus, history of PSVT, history of breast cancer, chronic hypoxic respiratory failure on home O2 was admitted to the hospital on 3/7 with 2 weeks of generalized progressive weakness and increased dyspnea on exertion.  She was found to be significantly fluid overloaded as well as chest x-ray in the ED showed large left-sided pleural effusion.  She was admitted for CHF exacerbation.    PT Comments    Attempted PT tx session on today. Pt too lethargic for any mobility. She could only keep her eyes open a few seconds at a time. She was able to participate with some LE exercises. Family continues to report that pt is not eating or drinking much. Continue to recommend SNF.     Follow Up Recommendations  SNF     Equipment Recommendations  None recommended by PT    Recommendations for Other Services       Precautions / Restrictions Precautions Precautions: Fall Precaution Comments: monitor O2 Restrictions Weight Bearing Restrictions: No    Mobility  Bed Mobility               General bed mobility comments: NT-pt too lethargic to attempt.   Transfers                    Ambulation/Gait                 Stairs             Wheelchair Mobility    Modified Rankin (Stroke Patients Only)       Balance                                            Cognition Arousal/Alertness: Lethargic                                            Exercises General Exercises - Lower Extremity Ankle Circles/Pumps: PROM;Both;5 reps;Supine Heel Slides: AROM;AAROM;Both;Supine;5 reps Hip ABduction/ADduction: 5 reps;AROM;AAROM;Both;Supine    General Comments        Pertinent Vitals/Pain Pain  Assessment: Faces Faces Pain Scale: Hurts little more Pain Location: foot Pain Descriptors / Indicators: Grimacing Pain Intervention(s): Limited activity within patient's tolerance    Home Living                      Prior Function            PT Goals (current goals can now be found in the care plan section) Progress towards PT goals: Not progressing toward goals - comment(too lethargic today. )    Frequency    Min 2X/week      PT Plan Current plan remains appropriate    Co-evaluation              AM-PAC PT "6 Clicks" Mobility   Outcome Measure  Help needed turning from your back to your side while in a flat bed without using bedrails?: A Lot Help needed moving from lying on your back to sitting on the side of a flat bed without using bedrails?: A Lot  Help needed moving to and from a bed to a chair (including a wheelchair)?: Total Help needed standing up from a chair using your arms (e.g., wheelchair or bedside chair)?: Total Help needed to walk in hospital room?: Total Help needed climbing 3-5 steps with a railing? : Total 6 Click Score: 8    End of Session   Activity Tolerance: Patient limited by fatigue Patient left: in bed;with call bell/phone within reach;with bed alarm set;with family/visitor present   PT Visit Diagnosis: Muscle weakness (generalized) (M62.81)     Time: 0063-4949 PT Time Calculation (min) (ACUTE ONLY): 8 min  Charges:  $Therapeutic Exercise: 8-22 mins                        Weston Anna, PT Acute Rehabilitation Services Pager: [phone] Office: [phone]

## 2019-02-27 NOTE — Care Management Important Message (Signed)
Important Message  Patient Details  Name: DAYANIRA GIOVANNETTI MRN: [id number] Date of Birth: 27-Oct-1921   Medicare Important Message Given:  Yes    Kerin Salen 02/27/2019, 12:24 [place] Message  Patient Details  Name: KAYDANCE BOWIE MRN: [id number] Date of Birth: 02-Jan-1921   Medicare Important Message Given:  Yes    Kerin Salen 02/27/2019, 12:24 PM

## 2019-02-28 LAB — BASIC METABOLIC PANEL
Anion gap: 11 (ref 5–15)
BUN: 30 mg/dL — ABNORMAL HIGH (ref 8–23)
CO2: 29 mmol/L (ref 22–32)
Calcium: 8.7 mg/dL — ABNORMAL LOW (ref 8.9–10.3)
Chloride: 87 mmol/L — ABNORMAL LOW (ref 98–111)
Creatinine, Ser: 0.94 mg/dL (ref 0.44–1.00)
GFR calc Af Amer: 60 mL/min — ABNORMAL LOW (ref >60)
GFR, EST NON AFRICAN AMERICAN: 52 mL/min — AB (ref >60)
Glucose, Bld: 85 mg/dL (ref 70–99)
Potassium: 4.5 mmol/L (ref 3.5–5.1)
Sodium: 127 mmol/L — ABNORMAL LOW (ref 135–145)

## 2019-02-28 LAB — CBC
HCT: 41.3 % (ref 36.0–46.0)
HEMOGLOBIN: 13 g/dL (ref 12.0–15.0)
MCH: 32.3 pg (ref 26.0–34.0)
MCHC: 31.5 g/dL (ref 30.0–36.0)
MCV: 102.5 fL — ABNORMAL HIGH (ref 80.0–100.0)
Platelets: 210 10*3/uL (ref 150–400)
RBC: 4.03 MIL/uL (ref 3.87–5.11)
RDW: 16.5 % — ABNORMAL HIGH (ref 11.5–15.5)
WBC: 6.8 10*3/uL (ref 4.0–10.5)
nRBC: 0 % (ref 0.0–0.2)

## 2019-02-28 LAB — CULTURE, BODY FLUID W GRAM STAIN -BOTTLE: Culture: NO GROWTH

## 2019-02-28 LAB — MAGNESIUM: MAGNESIUM: 1.9 mg/dL (ref 1.7–2.4)

## 2019-02-28 MED ORDER — SODIUM CHLORIDE 1 G PO TABS
1.0000 g | ORAL_TABLET | Freq: Three times a day (TID) | ORAL | Status: DC
  Administered 2019-02-28 – 2019-03-05 (×16): 1 g via ORAL
  Filled 2019-02-28 (×15): qty 1

## 2019-02-28 NOTE — Progress Notes (Signed)
PROGRESS NOTE  [name] HYQ:[id number] DOB: 01-22-1921 DOA: 02/21/2019 PCP: Virgie Dad, MD   LOS: 7 days   Brief narrative:\  85 year old female with history of diastolic CHF, hypertension, diet-controlled diabetes mellitus, history of PSVT, history of breast cancer, chronic hypoxic respiratory failure on home O2 was admitted to the hospital on 3/7 with 2 weeks of generalized progressive weakness and increased dyspnea on exertion.  She was found to be significantly fluid overloaded as well as chest x-ray in the ED showed large left-sided pleural effusion.    Patient underwent thoracocentesis with removal of 700 mL of fluid which was transudative in nature.  She was continued on IV diuretics with improvement in her symptoms.  Subjective: Patient is a poor historian but is overall more alert awake today.  She denies interval complaints.  Patient's family at bedside.  Assessment/Plan:  Active Problems:   Acute decompensated heart failure (HCC)  Acute on chronic hypoxic respiratory failure due to acute on chronic diastolic CHF -2D echocardiogram on 02/22/2019 showed normal ejection fraction with EF of 60 to 65% but increased RV systolic pressure.  A CT angiogram of the chest was performed which did not show any evidence of pulmonary embolism or pneumonia.  There was evidence of left-sided pleural effusion.  Patient underwent thoracocentesis with removal of 700 mL of fluid.  Lasix on hold due to hyponatremia and slight overdiuresis.received a gentle IV fluid hydration yesterday.  Pneumonia ruled out.  Patient has achieved a significant diuresis and weight loss during hospitalization.  Mild volume depletion likely secondary to significant diuresis.Marland Kitchen Received gentle IV fluid hydration for 10 hours yesterday.  Encouraged p.o. intake today.  She appears to be clinically hydrated today.  Continue to hold Lasix for now.  Mild acute kidney injury likely from volume depletion.  Proved with IV fluids.   Continue to hold Lasix for now.  Mild troponin elevation -Flat, not in a pattern consistent with ACS, no chest pain.  Likely secondary to decompensated CHF.  Klebsiella UTI Completed the course of cefdinir.  Hypokalemia Resolved.  Hold off with further potassium doses.    Hypertension -Continue atenolol, losartan. Closely monitor blood pressure.  Hyponatremia - Continue sodium chloride tablets.  Diet changed to regular.  She does have history of hyponatremia in the past as well.  Closely monitor weight on sodium tablets.    VTE Prophylaxis:  SCD  Code Status: DO NOT RESUSCITATE  Family Communication: I had a prolonged discussion with the patient's son at bedside and updated him about the clinical condition of the patient.  Disposition Plan:  SNF likely in 1 to 2 days.  Insurance authorization pending.  Labs in a.m.   Check BMP in a.m.  Consultants:  None  Procedures:  2D echo:    IMPRESSIONS  1. The left ventricle has normal systolic function with an ejection fraction of 60-65%. The cavity size was normal. There is mildly increased left ventricular wall thickness. Left ventricular diastolic Doppler parameters are consistent with  pseudonormalization. There is right ventricular volume and pressure overload. No evidence of left ventricular regional wall motion abnormalities. 2. The right ventricle has mildly reduced systolic function. The cavity was mildly enlarged. There is no increase in right ventricular wall thickness. Right ventricular systolic pressure is severely elevated with an estimated pressure of 74.6 mmHg. 3. Left atrial size was moderately dilated. 4. Right atrial size was mildly dilated. 5. The mitral valve is normal in structure. There is mild mitral annular calcification present. 6.  The tricuspid valve is normal in structure. Tricuspid valve regurgitation is mild-moderate. 7. The aortic valve is tricuspid. Aortic valve regurgitation is trivial by  color flow Doppler. 8. The aortic root is normal in size and structure.   Ultrasound-guided thoracocentesis on 02/23/2019  Antibiotics: Anti-infectives (From admission, onward)   Start     Dose/Rate Route Frequency Ordered Stop   02/24/19 2015  azithromycin (ZITHROMAX) tablet 500 mg  Status:  Discontinued     500 mg Oral Daily 02/24/19 2001 02/25/19 1005   02/22/19 1000  cefdinir (OMNICEF) capsule 300 mg     300 mg Oral Every 12 hours 02/21/19 2036 02/24/19 2321   02/21/19 1900  cefTRIAXone (ROCEPHIN) 1 g in sodium chloride 0.9 % 100 mL IVPB     1 g 200 mL/hr over 30 Minutes Intravenous  Once 02/21/19 1851 02/21/19 1958      Objective: Vitals:   02/27/19 2024 02/28/19 0653  BP: (!) 116/49 (!) 120/59  Pulse: 62 66  Resp: 18 18  Temp: (!) 97.5 F (36.4 C) 98.5 F (36.9 C)  SpO2: 98% 100%    Intake/Output Summary (Last 24 hours) at 02/28/2019 1218 Last data filed at 02/28/2019 0659 Gross per 24 hour  Intake 120 ml  Output 500 ml  Net -380 ml   Filed Weights   02/26/19 0600 02/27/19 0548 02/28/19 0653  Weight: 57.5 kg 56.6 kg 59 kg   Body mass index is 23.04 kg/m.   Physical Exam: General:  Average built, not in obvious distress.  More alert awake and communicative today.  On nasal cannula HENT: Normocephalic, pupils equally reacting to light and accommodation.  No scleral pallor or icterus noted. Oral mucosa is moist.  Chest: Diminished breath sounds bilaterally. CVS: S1 &S2 heard. No murmur.  Regular rate and rhythm. Abdomen: Soft, nontender, nondistended.  Bowel sounds are heard.  Liver is not palpable, no abdominal mass palpated Extremities: No cyanosis, clubbing or edema.  Peripheral pulses are palpable. Psych: Alert, awake and medicated., normal mood CNS:  No cranial nerve deficits.  Power equal in all extremities.  No sensory deficits noted.  No cerebellar signs.   Skin: Warm and dry.  No rashes noted.  Skin turgor has improved.  Data Review: I have personally  reviewed the following laboratory data and studies,  CBC: Recent Labs  Lab 02/21/19 1721  02/24/19 0456 02/25/19 0442 02/26/19 0348 02/27/19 0345 02/28/19 0356  WBC 7.5   < > 7.4 8.7 9.7 9.9 6.8  NEUTROABS 6.0  --   --   --   --   --   --   HGB 12.9   < > 11.0* 12.0 12.4 12.9 13.0  HCT 40.0   < > 34.7* 36.9 38.8 41.0 41.3  MCV 99.8   < > 102.1* 100.3* 100.3* 101.7* 102.5*  PLT 175   < > 180 199 218 218 210   < > = values in this interval not displayed.   Basic Metabolic Panel: Recent Labs  Lab 02/21/19 1721  02/24/19 0456 02/25/19 0442 02/26/19 0348 02/27/19 0345 02/28/19 0356  NA 132*   < > 132* 132* 129* 128* 127*  K 3.3*   < > 4.7 4.9 5.2* 4.8 4.5  CL 90*   < > 89* 84* 83* 85* 87*  CO2 30   < > 35* 38* 37* 33* 29  GLUCOSE 98   < > 95 98 89 91 85  BUN 18   < > 23 21 21  29*  30*  CREATININE 0.78   < > 0.83 0.92 0.92 1.07* 0.94  CALCIUM 8.9   < > 8.6* 9.0 8.9 9.1 8.7*  MG 1.5*   < > 1.8 1.7 1.7 1.6* 1.9  PHOS 3.0  --   --   --   --   --   --    < > = values in this interval not displayed.   Liver Function Tests: Recent Labs  Lab 02/21/19 1721  AST 54*  ALT 39  ALKPHOS 161*  BILITOT 1.7*  PROT 8.4*  ALBUMIN 3.8   Recent Labs  Lab 02/21/19 1721  LIPASE 39   No results for input(s): AMMONIA in the last 168 hours. Cardiac Enzymes: Recent Labs  Lab 02/21/19 1721 02/22/19 0450 02/22/19 1646  TROPONINI 0.05* 0.10* 0.12*   BNP (last 3 results) Recent Labs    02/21/19 1721  BNP 2,482.2*    ProBNP (last 3 results) No results for input(s): PROBNP in the last 8760 hours.  CBG: No results for input(s): GLUCAP in the last 168 hours. Recent Results (from the past 240 hour(s))  Urine culture     Status: Abnormal   Collection Time: 02/21/19  4:32 PM  Result Value Ref Range Status   Specimen Description   Final    URINE, RANDOM Performed at [facility] [address], [place], [place] [address]    Special Requests   Final    NONE  Performed at [facility] [place] [address], [place], [place] [address]    Culture >=100,000 COLONIES/mL KLEBSIELLA SPECIES (A)  Final   Report Status 02/24/2019 FINAL  Final   Organism ID, Bacteria KLEBSIELLA SPECIES (A)  Final      Susceptibility   Klebsiella species - MIC*    AMPICILLIN RESISTANT Resistant     CEFAZOLIN <=4 SENSITIVE Sensitive     CEFTRIAXONE <=1 SENSITIVE Sensitive     CIPROFLOXACIN <=0.25 SENSITIVE Sensitive     GENTAMICIN <=1 SENSITIVE Sensitive     IMIPENEM <=0.25 SENSITIVE Sensitive     NITROFURANTOIN <=16 SENSITIVE Sensitive     TRIMETH/SULFA <=20 SENSITIVE Sensitive     AMPICILLIN/SULBACTAM <=2 SENSITIVE Sensitive     PIP/TAZO <=4 SENSITIVE Sensitive     Extended ESBL NEGATIVE Sensitive     * >=100,000 COLONIES/mL KLEBSIELLA SPECIES  Culture, blood (routine x 2)     Status: None   Collection Time: 02/21/19  5:45 PM  Result Value Ref Range Status   Specimen Description   Final    BLOOD RIGHT FOREARM Performed at [facility] [address] [place], [place] [address]    Special Requests   Final    BOTTLES DRAWN AEROBIC AND ANAEROBIC Blood Culture adequate volume Performed at [facility] [address] [place], [place] [address]    Culture   Final    NO GROWTH 5 DAYS Performed at [facility], [place] [address], [place], [place] [address]    Report Status 02/26/2019 FINAL  Final  Culture, blood (routine x 2)     Status: None   Collection Time: 02/21/19  9:40 PM  Result Value Ref Range Status   Specimen Description   Final    BLOOD RIGHT ANTECUBITAL Performed at [facility] [address] [place],  [address]    Special Requests   Final    BOTTLES DRAWN AEROBIC ONLY Blood Culture adequate volume Performed at [facility]  [address], [place], [place] [address]    Culture   Final    NO GROWTH 5 DAYS Performed at [facility], [place] [address], [place], [place] [address]    Report Status 02/26/2019 FINAL  Final  MRSA PCR Screening     Status: None   Collection Time: 02/21/19 11:53 PM  Result Value Ref Range Status   MRSA by PCR NEGATIVE NEGATIVE Final    Comment:        The GeneXpert MRSA Assay (FDA approved for NASAL specimens only), is one component of a comprehensive MRSA colonization surveillance program. It is not intended to diagnose MRSA infection nor to guide or monitor treatment for MRSA infections. Performed at [facility], [place] [address] [place], [place] [address]   Culture, body fluid-bottle     Status: None (Preliminary result)   Collection Time: 02/23/19  4:31 PM  Result Value Ref Range Status   Specimen Description FLUID PLEURAL LEFT  Final   Special Requests BOTTLES DRAWN AEROBIC AND ANAEROBIC  Final   Culture   Final    NO GROWTH 4 DAYS Performed at [facility], [place] [address] [place], [place] [address]    Report Status PENDING  Incomplete  Gram stain     Status: None   Collection Time: 02/23/19  4:31 PM  Result Value Ref Range Status   Specimen Description FLUID PLEURAL LEFT  Final   Special Requests NONE  Final   Gram Stain   Final    WBC PRESENT,BOTH PMN AND MONONUCLEAR NO ORGANISMS SEEN CYTOSPIN SMEAR Performed at [facility], [address], [place],  [address]    Report Status 02/24/2019 FINAL  Final     Studies: No results found.  Scheduled Meds: . atenolol  25 mg Oral BID  . B-complex with vitamin C  1 tablet Oral Daily  . dorzolamide-timolol  1 drop Left Eye BID  . DULoxetine  30 mg Oral Daily  . feeding supplement  237 mL Oral Q lunch  . ferrous gluconate  324 mg Oral Q breakfast  . folic acid  0.5 mg Oral Daily  . gabapentin  100 mg Oral BID  . latanoprost  1 drop Both Eyes QHS  . losartan  50 mg Oral Daily  . magnesium oxide  400 mg Oral BID  . multivitamin  1 tablet Oral Daily  . pantoprazole  40 mg Oral Daily   . sodium chloride flush  3 mL Intravenous Q12H  . sodium chloride  1 g Oral BID WC    Continuous Infusions:    Flora Lipps, MD  [facility] 02/28/2019

## 2019-03-01 LAB — BASIC METABOLIC PANEL
Anion gap: 10 (ref 5–15)
BUN: 29 mg/dL — ABNORMAL HIGH (ref 8–23)
CO2: 29 mmol/L (ref 22–32)
Calcium: 8.6 mg/dL — ABNORMAL LOW (ref 8.9–10.3)
Chloride: 89 mmol/L — ABNORMAL LOW (ref 98–111)
Creatinine, Ser: 0.95 mg/dL (ref 0.44–1.00)
GFR calc Af Amer: 59 mL/min — ABNORMAL LOW (ref >60)
GFR calc non Af Amer: 51 mL/min — ABNORMAL LOW (ref >60)
GLUCOSE: 90 mg/dL (ref 70–99)
Potassium: 4.2 mmol/L (ref 3.5–5.1)
Sodium: 128 mmol/L — ABNORMAL LOW (ref 135–145)

## 2019-03-01 LAB — CBC
HCT: 35.3 % — ABNORMAL LOW (ref 36.0–46.0)
HEMOGLOBIN: 11.5 g/dL — AB (ref 12.0–15.0)
MCH: 32.6 pg (ref 26.0–34.0)
MCHC: 32.6 g/dL (ref 30.0–36.0)
MCV: 100 fL (ref 80.0–100.0)
Platelets: 244 10*3/uL (ref 150–400)
RBC: 3.53 MIL/uL — ABNORMAL LOW (ref 3.87–5.11)
RDW: 16.2 % — ABNORMAL HIGH (ref 11.5–15.5)
WBC: 7.3 10*3/uL (ref 4.0–10.5)
nRBC: 0 % (ref 0.0–0.2)

## 2019-03-01 NOTE — Progress Notes (Signed)
PROGRESS NOTE  [name] VQX:[id number] DOB: [birth year] DOA: 02/21/2019 PCP: Virgie Dad, MD   LOS: 8 days   Brief narrative:  85 year old female with history of diastolic CHF, hypertension, diet-controlled diabetes mellitus, history of PSVT, history of breast cancer, chronic hypoxic respiratory failure on home O2 was admitted to the hospital on 3/7 with 2 weeks of generalized progressive weakness and increased dyspnea on exertion.  She was found to be significantly fluid overloaded as well as chest x-ray in the ED showed large left-sided pleural effusion.    Patient underwent thoracocentesis with removal of 700 mL of fluid which was transudative in nature.  She was continued on IV diuretics with improvement in her symptoms.  Subsequently, diuretics were discontinued and gentle IV fluid was given due to mild dehydration from overdiuresis.  Subjective: Patient is a poor historian but is overall more alert awake today.  No interval complaints reported.  Patient's family at bedside.  As of generalized weakness  Assessment/Plan:  Active Problems:   Acute decompensated heart failure (HCC)  Acute on chronic hypoxic respiratory failure due to acute on chronic diastolic CHF -2D echocardiogram on 02/22/2019 showed normal ejection fraction with EF of 60 to 65% but increased RV systolic pressure.  A CT angiogram of the chest was performed which did not show any evidence of pulmonary embolism or pneumonia.  There was evidence of left-sided pleural effusion.  Patient underwent thoracocentesis with removal of 700 mL of fluid.  Lasix on hold due to hyponatremia and slight overdiuresis. Received  gentle IV fluid hydration on 02/27/2019 and has been encouraged on oral fluids.  Pneumonia ruled out.  Patient has achieved a significant diuresis and weight loss during hospitalization.  Urine is negative for 10 4 3 7  mL so far  Hyponatremia - Continue sodium chloride tablets.  On regular diet.  She does have  history of hyponatremia in the past as well.  Closely monitor weight on sodium tablets.  Sodium is low but has plateaued.  Check BMP in a.m.   Mild volume depletion likely secondary to significant diuresis.. Improved with mild hydration.  Increase p.o. intake.   Continue to hold Lasix for now.  Mild acute kidney injury likely from volume depletion.  Resolved at this time.  Continue to hold Lasix.  Encourage oral intake.  Mild troponin elevation -Flat, not in a pattern consistent with ACS, no chest pain.  Likely secondary to decompensated CHF.  Klebsiella UTI Completed the course of cefdinir.  Hypokalemia Resolved.  Hold off with further potassium doses.    Hypertension -Continue atenolol, losartan. Closely monitor blood pressure.  VTE Prophylaxis:  SCD  Code Status: DO NOT RESUSCITATE  Family Communication: Spoke with the patient's family member at bedside.  Disposition Plan:  SNF likely in 1 to 2 days.  Insurance authorization pending.  Check BMP in a.m.  Consultants:  None  Procedures:  2D echo:    IMPRESSIONS  1. The left ventricle has normal systolic function with an ejection fraction of 60-65%. The cavity size was normal. There is mildly increased left ventricular wall thickness. Left ventricular diastolic Doppler parameters are consistent with  pseudonormalization. There is right ventricular volume and pressure overload. No evidence of left ventricular regional wall motion abnormalities. 2. The right ventricle has mildly reduced systolic function. The cavity was mildly enlarged. There is no increase in right ventricular wall thickness. Right ventricular systolic pressure is severely elevated with an estimated pressure of 74.6 mmHg. 3. Left atrial size was moderately  dilated. 4. Right atrial size was mildly dilated. 5. The mitral valve is normal in structure. There is mild mitral annular calcification present. 6. The tricuspid valve is normal in structure.  Tricuspid valve regurgitation is mild-moderate. 7. The aortic valve is tricuspid. Aortic valve regurgitation is trivial by color flow Doppler. 8. The aortic root is normal in size and structure.   Ultrasound-guided thoracocentesis on 02/23/2019  Antibiotics: Anti-infectives (From admission, onward)   Start     Dose/Rate Route Frequency Ordered Stop   02/24/19 2015  azithromycin (ZITHROMAX) tablet 500 mg  Status:  Discontinued     500 mg Oral Daily 02/24/19 2001 02/25/19 1005   02/22/19 1000  cefdinir (OMNICEF) capsule 300 mg     300 mg Oral Every 12 hours 02/21/19 2036 02/24/19 2321   02/21/19 1900  cefTRIAXone (ROCEPHIN) 1 g in sodium chloride 0.9 % 100 mL IVPB     1 g 200 mL/hr over 30 Minutes Intravenous  Once 02/21/19 1851 02/21/19 1958      Objective: Vitals:   02/28/19 2229 03/01/19 0534  BP: (!) 126/58 (!) 121/56  Pulse: (!) 57 (!) 57  Resp:  16  Temp: (!) 97.4 F (36.3 C) 97.6 F (36.4 C)  SpO2: 100% 100%    Intake/Output Summary (Last 24 hours) at 03/01/2019 0944 Last data filed at 03/01/2019 0540 Gross per 24 hour  Intake 3 ml  Output 350 ml  Net -347 ml   Filed Weights   02/27/19 0548 02/28/19 0653 03/01/19 0534  Weight: 56.6 kg 59 kg 62 kg   Body mass index is 24.21 kg/m.   Physical Exam: General:  Average built, not in obvious distress, but awake and communicative.  On nasal cannula. HENT: Normocephalic, pupils equally reacting to light and accommodation.  No scleral pallor or icterus noted. Oral mucosa is moist.  Chest: Diminished breath sounds bilaterally. CVS: S1 &S2 heard. No murmur.  Regular rate and rhythm. Abdomen: Soft, nontender, nondistended.  Bowel sounds are heard.  Liver is not palpable, no abdominal mass palpated Extremities: No cyanosis, clubbing or edema.  Peripheral pulses are palpable. Psych: Alert, awake and communicative,, normal mood CNS:  No cranial nerve deficits.  Power equal in all extremities.  No sensory deficits noted.  No  cerebellar signs.   Skin: Warm and dry.  No rashes noted. .  Data Review: I have personally reviewed the following laboratory data and studies,  CBC: Recent Labs  Lab 02/25/19 0442 02/26/19 0348 02/27/19 0345 02/28/19 0356 03/01/19 0430  WBC 8.7 9.7 9.9 6.8 7.3  HGB 12.0 12.4 12.9 13.0 11.5*  HCT 36.9 38.8 41.0 41.3 35.3*  MCV 100.3* 100.3* 101.7* 102.5* 100.0  PLT 199 218 218 210 176   Basic Metabolic Panel: Recent Labs  Lab 02/24/19 0456 02/25/19 0442 02/26/19 0348 02/27/19 0345 02/28/19 0356 03/01/19 0430  NA 132* 132* 129* 128* 127* 128*  K 4.7 4.9 5.2* 4.8 4.5 4.2  CL 89* 84* 83* 85* 87* 89*  CO2 35* 38* 37* 33* 29 29  GLUCOSE 95 98 89 91 85 90  BUN 23 21 21  29* 30* 29*  CREATININE 0.83 0.92 0.92 1.07* 0.94 0.95  CALCIUM 8.6* 9.0 8.9 9.1 8.7* 8.6*  MG 1.8 1.7 1.7 1.6* 1.9  --    Liver Function Tests: No results for input(s): AST, ALT, ALKPHOS, BILITOT, PROT, ALBUMIN in the last 168 hours. No results for input(s): LIPASE, AMYLASE in the last 168 hours. No results for input(s): AMMONIA in the last  168 hours. Cardiac Enzymes: Recent Labs  Lab 02/22/19 1646  TROPONINI 0.12*   BNP (last 3 results) Recent Labs    02/21/19 1721  BNP 2,482.2*    ProBNP (last 3 results) No results for input(s): PROBNP in the last 8760 hours.  CBG: No results for input(s): GLUCAP in the last 168 hours. Recent Results (from the past 240 hour(s))  Urine culture     Status: Abnormal   Collection Time: 02/21/19  4:32 PM  Result Value Ref Range Status   Specimen Description   Final    URINE, RANDOM Performed at [facility] [address], [place], [place] [address]    Special Requests   Final    NONE Performed at [facility], [place] [address], [place], [place] [address]    Culture >=100,000 COLONIES/mL KLEBSIELLA SPECIES (A)  Final   Report Status 02/24/2019 FINAL  Final   Organism ID, Bacteria KLEBSIELLA SPECIES (A)  Final       Susceptibility   Klebsiella species - MIC*    AMPICILLIN RESISTANT Resistant     CEFAZOLIN <=4 SENSITIVE Sensitive     CEFTRIAXONE <=1 SENSITIVE Sensitive     CIPROFLOXACIN <=0.25 SENSITIVE Sensitive     GENTAMICIN <=1 SENSITIVE Sensitive     IMIPENEM <=0.25 SENSITIVE Sensitive     NITROFURANTOIN <=16 SENSITIVE Sensitive     TRIMETH/SULFA <=20 SENSITIVE Sensitive     AMPICILLIN/SULBACTAM <=2 SENSITIVE Sensitive     PIP/TAZO <=4 SENSITIVE Sensitive     Extended ESBL NEGATIVE Sensitive     * >=100,000 COLONIES/mL KLEBSIELLA SPECIES  Culture, blood (routine x 2)     Status: None   Collection Time: 02/21/19  5:45 PM  Result Value Ref Range Status   Specimen Description   Final    BLOOD RIGHT FOREARM Performed at [facility] [address] [place], [place] [address]    Special Requests   Final    BOTTLES DRAWN AEROBIC AND ANAEROBIC Blood Culture adequate volume Performed at [facility] [address] [place], [place] [address]    Culture   Final    NO GROWTH 5 DAYS Performed at [facility], [place] [address], [place], [place] [address]    Report Status 02/26/2019 FINAL  Final  Culture, blood (routine x 2)     Status: None   Collection Time: 02/21/19  9:40 PM  Result Value Ref Range Status   Specimen Description   Final    BLOOD RIGHT ANTECUBITAL Performed at [facility] [address] [place], [place] [address]    Special Requests   Final    BOTTLES DRAWN AEROBIC ONLY Blood Culture adequate volume Performed at [facility] [address] [place], [place] [address]    Culture   Final    NO GROWTH 5 DAYS Performed at [facility], [place] [address] [place], [place] [address]    Report Status 02/26/2019 FINAL  Final  MRSA PCR Screening     Status: None   Collection Time: 02/21/19 11:53 PM  Result Value Ref Range Status   MRSA by PCR NEGATIVE NEGATIVE Final    Comment:        The  GeneXpert MRSA Assay (FDA approved for NASAL specimens only), is one component of a comprehensive MRSA colonization surveillance program. It is not intended to diagnose MRSA infection nor to guide or monitor treatment for MRSA infections. Performed at [facility], [place] [address] [place], [place] [address]   Culture, body fluid-bottle     Status: None   Collection Time: 02/23/19  4:31 PM  Result Value Ref Range Status   Specimen Description FLUID PLEURAL LEFT  Final   Special Requests BOTTLES DRAWN AEROBIC AND ANAEROBIC  Final   Culture   Final    NO GROWTH 5 DAYS Performed at [facility], [place] [address] [place], [place] [address]    Report Status 02/28/2019 FINAL  Final  Gram stain     Status: None   Collection Time: 02/23/19  4:31 PM  Result Value Ref Range Status   Specimen Description FLUID PLEURAL LEFT  Final   Special Requests NONE  Final   Gram Stain   Final    WBC PRESENT,BOTH PMN AND MONONUCLEAR NO ORGANISMS SEEN CYTOSPIN SMEAR Performed at [facility], [place] [address] [place], [place] [address]    Report Status 02/24/2019 FINAL  Final     Studies: No results found.  Scheduled Meds:  atenolol  25 mg Oral BID   B-complex with vitamin C  1 tablet Oral Daily   dorzolamide-timolol  1 drop Left Eye BID   DULoxetine  30 mg Oral Daily   feeding supplement  237 mL Oral Q lunch   ferrous gluconate  324 mg Oral Q breakfast   folic acid  0.5 mg Oral Daily   gabapentin  100 mg Oral BID   latanoprost  1 drop Both Eyes QHS   losartan  50 mg Oral Daily   magnesium oxide  400 mg Oral BID   multivitamin  1 tablet Oral Daily   pantoprazole  40 mg Oral Daily   sodium chloride flush  3 mL Intravenous Q12H   sodium chloride  1 g Oral TID WC    Continuous Infusions:    Flora Lipps, MD  [facility] 03/01/2019

## 2019-03-02 LAB — CBC
HCT: 35.9 % — ABNORMAL LOW (ref 36.0–46.0)
Hemoglobin: 11.6 g/dL — ABNORMAL LOW (ref 12.0–15.0)
MCH: 32.7 pg (ref 26.0–34.0)
MCHC: 32.3 g/dL (ref 30.0–36.0)
MCV: 101.1 fL — AB (ref 80.0–100.0)
Platelets: 251 10*3/uL (ref 150–400)
RBC: 3.55 MIL/uL — ABNORMAL LOW (ref 3.87–5.11)
RDW: 16.1 % — ABNORMAL HIGH (ref 11.5–15.5)
WBC: 7.1 10*3/uL (ref 4.0–10.5)
nRBC: 0 % (ref 0.0–0.2)

## 2019-03-02 LAB — BASIC METABOLIC PANEL
Anion gap: 8 (ref 5–15)
BUN: 27 mg/dL — ABNORMAL HIGH (ref 8–23)
CO2: 29 mmol/L (ref 22–32)
Calcium: 8.8 mg/dL — ABNORMAL LOW (ref 8.9–10.3)
Chloride: 92 mmol/L — ABNORMAL LOW (ref 98–111)
Creatinine, Ser: 0.84 mg/dL (ref 0.44–1.00)
GFR calc Af Amer: >60 mL/min (ref >60)
GFR calc non Af Amer: 59 mL/min — ABNORMAL LOW (ref >60)
GLUCOSE: 89 mg/dL (ref 70–99)
Potassium: 4.6 mmol/L (ref 3.5–5.1)
Sodium: 129 mmol/L — ABNORMAL LOW (ref 135–145)

## 2019-03-02 NOTE — Progress Notes (Signed)
Physical Therapy Treatment Patient Details Name: [name] MRN: [id number] DOB: 06-12-1921 Today's Date: 03/02/2019    History of Present Illness 85 year old female with history of diastolic CHF, hypertension, diet-controlled diabetes mellitus, history of PSVT, history of breast cancer, chronic hypoxic respiratory failure on home O2 was admitted to the hospital on 3/7 with 2 weeks of generalized progressive weakness and increased dyspnea on exertion.  She was found to be significantly fluid overloaded as well as chest x-ray in the ED showed large left-sided pleural effusion.  She was admitted for CHF exacerbation.    PT Comments    Pt was more alert and able to participate with PT on today. Mod assist for mobility overall. She was able to perform a stand pivot and take a few ambulatory steps using the RW. Continue to recommend ST rehab at [facility].     Follow Up Recommendations  SNF     Equipment Recommendations  None recommended by PT    Recommendations for Other Services       Precautions / Restrictions Precautions Precautions: Fall Restrictions Weight Bearing Restrictions: No    Mobility  Bed Mobility Overal bed mobility: Needs Assistance Bed Mobility: Supine to Sit     Supine to sit: Mod assist;HOB elevated     General bed mobility comments: Assist for trunk and to scoot to EOB. Increased time. Pt relied heavily on bedrail. Cues for technique.   Transfers Overall transfer level: Needs assistance Equipment used: Rolling walker (2 wheeled) Transfers: Sit to/from [facility] Sit to Stand: Mod assist Stand pivot transfers: Min assist       General transfer comment: Assist to rise, stabilize, control descent. VCs safety, technique, hand placement. Stand pivot, bed to bsc, with RW. Increased time. Posterior bias noted.   Ambulation/Gait Ambulation/Gait assistance: Min assist; +2 safety/equipment Gait Distance (Feet): 3 Feet Assistive device: Rolling  walker (2 wheeled) Gait Pattern/deviations: Step-to pattern;Trunk flexed;Antalgic     General Gait Details: Assist to stabilize pt and manage RW. Increased time. Ambulation difficult 2* pain in bil feet and knees. Pt was able to take a few steps from bsc to recliner using RW. Again, posteior bias noted.    Stairs             Wheelchair Mobility    Modified Rankin (Stroke Patients Only)       Balance Overall balance assessment: Needs assistance Sitting-balance support: Bilateral upper extremity supported;Feet supported Sitting balance-Leahy Scale: Fair   Postural control: Posterior lean Standing balance support: Bilateral upper extremity supported Standing balance-Leahy Scale: Poor                              Cognition Arousal/Alertness: Awake/alert Behavior During Therapy: WFL for tasks assessed/performed Overall Cognitive Status: Within Functional Limits for tasks assessed                                        Exercises      General Comments        Pertinent Vitals/Pain Pain Assessment: Faces Faces Pain Scale: Hurts even more Pain Location: bil feet, knees Pain Descriptors / Indicators: Grimacing;Tender Pain Intervention(s): Limited activity within patient's tolerance;Repositioned    Home Living                      Prior Function  PT Goals (current goals can now be found in the care plan section) Progress towards PT goals: Progressing toward goals    Frequency    Min 2X/week      PT Plan      Co-evaluation              AM-PAC PT "6 Clicks" Mobility   Outcome Measure  Help needed turning from your back to your side while in a flat bed without using bedrails?: A Lot Help needed moving from lying on your back to sitting on the side of a flat bed without using bedrails?: A Lot Help needed moving to and from a bed to a chair (including a wheelchair)?: A Lot Help needed standing up from  a chair using your arms (e.g., wheelchair or bedside chair)?: A Lot Help needed to walk in hospital room?: A Lot Help needed climbing 3-5 steps with a railing? : Total 6 Click Score: 11    End of Session Equipment Utilized During Treatment: Gait belt Activity Tolerance: Patient tolerated treatment well Patient left: in chair;with call bell/phone within reach;with chair alarm set;with family/visitor present   PT Visit Diagnosis: Unsteadiness on feet (R26.81);Muscle weakness (generalized) (M62.81);Difficulty in walking, not elsewhere classified (R26.2);Pain Pain - part of body: Ankle and joints of foot;Knee(bilateral)     Time: 5449-2010 PT Time Calculation (min) (ACUTE ONLY): 36 min  Charges:  $Therapeutic Activity: 23-37 mins                        Weston Anna, PT Acute Rehabilitation Services Pager: [phone] Office: [phone]

## 2019-03-02 NOTE — Progress Notes (Signed)
Assumed care of patient at 23:00. I agree with previous RN's assessment and will continue to monitor patient.

## 2019-03-02 NOTE — Progress Notes (Signed)
PROGRESS NOTE  [name] VQX:[id number] DOB: 1921-07-04 DOA: 02/21/2019 PCP: Virgie Dad, MD   LOS: 9 days   Brief narrative:  85 year old female with history of diastolic CHF, hypertension, diet-controlled diabetes mellitus, history of PSVT, history of breast cancer, chronic hypoxic respiratory failure on home O2 was admitted to the hospital on 3/7 with 2 weeks of generalized progressive weakness and increased dyspnea on exertion.  She was found to be significantly fluid overloaded as well as chest x-ray in the ED showed large left-sided pleural effusion.    Patient underwent thoracocentesis with removal of 700 mL of fluid which was transudative in nature.  She was continued on IV diuretics with improvement in her symptoms.  Subsequently, diuretics were discontinued and gentle IV fluid was given due to mild dehydration from overdiuresis.  Subjective:  Patient denies interval complaints except for mild confusion at nighttime.  Patient's family at bedside.  Assessment/Plan:  Active Problems:   Acute decompensated heart failure (HCC)  Acute on chronic hypoxic respiratory failure due to acute on chronic diastolic CHF -2D echocardiogram on 02/22/2019 showed normal ejection fraction with EF of 60 to 65% but increased RV systolic pressure.  A CT angiogram of the chest was performed which did not show any evidence of pulmonary embolism or pneumonia.  There was evidence of left-sided pleural effusion.  Patient underwent thoracocentesis with removal of 700 mL of fluid.  Lasix on hold due to hyponatremia and slight overdiuresis. Received  gentle IV fluid hydration on 02/27/2019 and the patient has been encouraged on oral fluids.  Pneumonia ruled out.  Patient has achieved a significant diuresis and weight loss during hospitalization.  Net negative for 11167 mL so far  Hyponatremia - Continue sodium chloride tablets from home.  On regular diet.  She does have history of hyponatremia in the past as  well.  Closely monitor    Mild volume depletion likely secondary to significant diuresis.. Improved with mild hydration.  Increase p.o. intake.   Continue to hold Lasix for now.  Mild acute kidney injury likely from volume depletion.  Resolved at this time.  Continue to hold Lasix.  Encourage oral intake.  Mild troponin elevation -Flat, not in a pattern consistent with ACS, no chest pain.  Likely secondary to decompensated CHF.  Klebsiella UTI Completed the course of cefdinir.  Hypokalemia Resolved.  Hold off with further potassium doses.    Hypertension -Continue atenolol, losartan. Closely monitor blood pressure.  VTE Prophylaxis:  SCD  Code Status: DO NOT RESUSCITATE  Family Communication: Spoke with the patient's family member at bedside.  Disposition Plan:  SNF likely in 1 to 2 days.  She is medically stable for disposition.  Insurance authorization pending.  Check BMP in a.m.  Consultants:  None  Procedures:  2D echo:    IMPRESSIONS  1. The left ventricle has normal systolic function with an ejection fraction of 60-65%. The cavity size was normal. There is mildly increased left ventricular wall thickness. Left ventricular diastolic Doppler parameters are consistent with  pseudonormalization. There is right ventricular volume and pressure overload. No evidence of left ventricular regional wall motion abnormalities. 2. The right ventricle has mildly reduced systolic function. The cavity was mildly enlarged. There is no increase in right ventricular wall thickness. Right ventricular systolic pressure is severely elevated with an estimated pressure of 74.6 mmHg. 3. Left atrial size was moderately dilated. 4. Right atrial size was mildly dilated. 5. The mitral valve is normal in structure. There is mild  mitral annular calcification present. 6. The tricuspid valve is normal in structure. Tricuspid valve regurgitation is mild-moderate. 7. The aortic valve is  tricuspid. Aortic valve regurgitation is trivial by color flow Doppler. 8. The aortic root is normal in size and structure.   Ultrasound-guided thoracocentesis on 02/23/2019  Antibiotics: Anti-infectives (From admission, onward)   Start     Dose/Rate Route Frequency Ordered Stop   02/24/19 2015  azithromycin (ZITHROMAX) tablet 500 mg  Status:  Discontinued     500 mg Oral Daily 02/24/19 2001 02/25/19 1005   02/22/19 1000  cefdinir (OMNICEF) capsule 300 mg     300 mg Oral Every 12 hours 02/21/19 2036 02/24/19 2321   02/21/19 1900  cefTRIAXone (ROCEPHIN) 1 g in sodium chloride 0.9 % 100 mL IVPB     1 g 200 mL/hr over 30 Minutes Intravenous  Once 02/21/19 1851 02/21/19 1958      Objective: Vitals:   03/02/19 0522 03/02/19 0919  BP: (!) 111/45 (!) 115/48  Pulse: (!) 59 60  Resp: 16 16  Temp: 98.6 F (37 C) 98.2 F (36.8 C)  SpO2: 100% 100%    Intake/Output Summary (Last 24 hours) at 03/02/2019 1257 Last data filed at 03/02/2019 0814 Gross per 24 hour  Intake 600 ml  Output 1450 ml  Net -850 ml   Filed Weights   02/28/19 0653 03/01/19 0534 03/02/19 0622  Weight: 59 kg 62 kg 56.3 kg   Body mass index is 21.99 kg/m.   Physical Exam: General: Alert awake and communicative not in obvious distress, on nasal cannula HENT: Normocephalic, pupils equally reacting to light and accommodation.  No scleral pallor or icterus noted. Oral mucosa is moist.  Chest:    Diminished breath sounds bilaterally.  Obvious wheezes or crackles noted.   CVS: S1 &S2 heard. No murmur.  Regular rate and rhythm. Abdomen: Soft, nontender, nondistended.  Bowel sounds are heard.  Liver is not palpable, no abdominal mass palpated Extremities: No cyanosis, clubbing with trace pedal edema, peripheral pulses are palpable. Psych: Alert, awake and oriented, normal mood CNS:  No cranial nerve deficits.  Power equal in all extremities.  No sensory deficits noted.  No cerebellar signs.   Skin: Warm and dry.  No  rashes noted.   .  Data Review: I have personally reviewed the following laboratory data and studies,  CBC: Recent Labs  Lab 02/26/19 0348 02/27/19 0345 02/28/19 0356 03/01/19 0430 03/02/19 0433  WBC 9.7 9.9 6.8 7.3 7.1  HGB 12.4 12.9 13.0 11.5* 11.6*  HCT 38.8 41.0 41.3 35.3* 35.9*  MCV 100.3* 101.7* 102.5* 100.0 101.1*  PLT 218 218 210 244 284   Basic Metabolic Panel: Recent Labs  Lab 02/24/19 0456 02/25/19 0442 02/26/19 0348 02/27/19 0345 02/28/19 0356 03/01/19 0430 03/02/19 0433  NA 132* 132* 129* 128* 127* 128* 129*  K 4.7 4.9 5.2* 4.8 4.5 4.2 4.6  CL 89* 84* 83* 85* 87* 89* 92*  CO2 35* 38* 37* 33* 29 29 29   GLUCOSE 95 98 89 91 85 90 89  BUN 23 21 21  29* 30* 29* 27*  CREATININE 0.83 0.92 0.92 1.07* 0.94 0.95 0.84  CALCIUM 8.6* 9.0 8.9 9.1 8.7* 8.6* 8.8*  MG 1.8 1.7 1.7 1.6* 1.9  --   --    Liver Function Tests: No results for input(s): AST, ALT, ALKPHOS, BILITOT, PROT, ALBUMIN in the last 168 hours. No results for input(s): LIPASE, AMYLASE in the last 168 hours. No results for input(s): AMMONIA in  the last 168 hours. Cardiac Enzymes: No results for input(s): CKTOTAL, CKMB, CKMBINDEX, TROPONINI in the last 168 hours. BNP (last 3 results) Recent Labs    02/21/19 1721  BNP 2,482.2*    ProBNP (last 3 results) No results for input(s): PROBNP in the last 8760 hours.  CBG: No results for input(s): GLUCAP in the last 168 hours. Recent Results (from the past 240 hour(s))  Urine culture     Status: Abnormal   Collection Time: 02/21/19  4:32 PM  Result Value Ref Range Status   Specimen Description   Final    URINE, RANDOM Performed at [facility] [address], [place], [place] [address]    Special Requests   Final    NONE Performed at [facility], [place] [address] [place], [place] [address]    Culture >=100,000 COLONIES/mL KLEBSIELLA SPECIES (A)  Final   Report Status 02/24/2019 FINAL  Final   Organism ID,  Bacteria KLEBSIELLA SPECIES (A)  Final      Susceptibility   Klebsiella species - MIC*    AMPICILLIN RESISTANT Resistant     CEFAZOLIN <=4 SENSITIVE Sensitive     CEFTRIAXONE <=1 SENSITIVE Sensitive     CIPROFLOXACIN <=0.25 SENSITIVE Sensitive     GENTAMICIN <=1 SENSITIVE Sensitive     IMIPENEM <=0.25 SENSITIVE Sensitive     NITROFURANTOIN <=16 SENSITIVE Sensitive     TRIMETH/SULFA <=20 SENSITIVE Sensitive     AMPICILLIN/SULBACTAM <=2 SENSITIVE Sensitive     PIP/TAZO <=4 SENSITIVE Sensitive     Extended ESBL NEGATIVE Sensitive     * >=100,000 COLONIES/mL KLEBSIELLA SPECIES  Culture, blood (routine x 2)     Status: None   Collection Time: 02/21/19  5:45 PM  Result Value Ref Range Status   Specimen Description   Final    BLOOD RIGHT FOREARM Performed at [facility] [address] [place], [place] [address]    Special Requests   Final    BOTTLES DRAWN AEROBIC AND ANAEROBIC Blood Culture adequate volume Performed at [facility] [address], [place], [place] [address]    Culture   Final    NO GROWTH 5 DAYS Performed at [facility], [place] [address] [place], [place] [address]    Report Status 02/26/2019 FINAL  Final  Culture, blood (routine x 2)     Status: None   Collection Time: 02/21/19  9:40 PM  Result Value Ref Range Status   Specimen Description   Final    BLOOD RIGHT ANTECUBITAL Performed at [facility] [address] [place], [place] [address]    Special Requests   Final    BOTTLES DRAWN AEROBIC ONLY Blood Culture adequate volume Performed at [facility] [address] [place], [place] [address]    Culture   Final    NO GROWTH 5 DAYS Performed at [facility], [place] [address] [place],  [address]    Report Status 02/26/2019 FINAL  Final  MRSA PCR Screening     Status: None   Collection Time: 02/21/19 11:53 PM  Result Value Ref Range Status   MRSA by PCR  NEGATIVE NEGATIVE Final    Comment:        The GeneXpert MRSA Assay (FDA approved for NASAL specimens only), is one component of a comprehensive MRSA colonization surveillance program. It is not intended to diagnose MRSA infection nor to guide or monitor treatment for MRSA infections.  Performed at [facility], [place] [address] [place], [place] [address]   Culture, body fluid-bottle     Status: None   Collection Time: 02/23/19  4:31 PM  Result Value Ref Range Status   Specimen Description FLUID PLEURAL LEFT  Final   Special Requests BOTTLES DRAWN AEROBIC AND ANAEROBIC  Final   Culture   Final    NO GROWTH 5 DAYS Performed at [facility], [place] [address] [place], [place] [address]    Report Status 02/28/2019 FINAL  Final  Gram stain     Status: None   Collection Time: 02/23/19  4:31 PM  Result Value Ref Range Status   Specimen Description FLUID PLEURAL LEFT  Final   Special Requests NONE  Final   Gram Stain   Final    WBC PRESENT,BOTH PMN AND MONONUCLEAR NO ORGANISMS SEEN CYTOSPIN SMEAR Performed at [facility], [place] [address], [place], [place] [address]    Report Status 02/24/2019 FINAL  Final     Studies: No results found.  Scheduled Meds:  atenolol  25 mg Oral BID   B-complex with vitamin C  1 tablet Oral Daily   dorzolamide-timolol  1 drop Left Eye BID   DULoxetine  30 mg Oral Daily   feeding supplement  237 mL Oral Q lunch   ferrous gluconate  324 mg Oral Q breakfast   folic acid  0.5 mg Oral Daily   gabapentin  100 mg Oral BID   latanoprost  1 drop Both Eyes QHS   losartan  50 mg Oral Daily   magnesium oxide  400 mg Oral BID   multivitamin  1 tablet Oral Daily   pantoprazole  40 mg Oral Daily   sodium chloride flush  3 mL Intravenous Q12H   sodium chloride  1 g Oral TID WC    Continuous Infusions:    Flora Lipps, MD  [facility] 03/02/2019

## 2019-03-02 NOTE — Plan of Care (Signed)
  Problem: Clinical Measurements: Goal: Ability to maintain clinical measurements within normal limits will improve Outcome: Progressing Goal: Will remain free from infection Description Pt education will be completed with daughter in law or other family member  Outcome: Progressing Goal: Diagnostic test results will improve Outcome: Progressing Goal: Respiratory complications will improve Outcome: Progressing Goal: Cardiovascular complication will be avoided Outcome: Progressing   Problem: Education: Goal: Ability to demonstrate management of disease process will improve Outcome: Progressing Goal: Ability to verbalize understanding of medication therapies will improve Outcome: Progressing Goal: Individualized Educational Video(s) Outcome: Progressing   Problem: Activity: Goal: Capacity to carry out activities will improve Outcome: Progressing   Problem: Cardiac: Goal: Ability to achieve and maintain adequate cardiopulmonary perfusion will improve Outcome: Progressing

## 2019-03-02 NOTE — Care Management Important Message (Signed)
Important Message  Patient Details  Name: [name] MRN: [id number] Date of Birth: 1921/08/23   Medicare Important Message Given:  Yes    Kerin Salen 03/02/2019, 12:41 [place] Message  Patient Details  Name: [name] MRN: [id number] Date of Birth: 23-May-1921   Medicare Important Message Given:  Yes    Kerin Salen 03/02/2019, 12:41 PM

## 2019-03-03 LAB — BASIC METABOLIC PANEL
Anion gap: 7 (ref 5–15)
BUN: 24 mg/dL — ABNORMAL HIGH (ref 8–23)
CO2: 30 mmol/L (ref 22–32)
Calcium: 9 mg/dL (ref 8.9–10.3)
Chloride: 93 mmol/L — ABNORMAL LOW (ref 98–111)
Creatinine, Ser: 0.87 mg/dL (ref 0.44–1.00)
GFR calc Af Amer: >60 mL/min (ref >60)
GFR calc non Af Amer: 57 mL/min — ABNORMAL LOW (ref >60)
Glucose, Bld: 137 mg/dL — ABNORMAL HIGH (ref 70–99)
Potassium: 4.1 mmol/L (ref 3.5–5.1)
Sodium: 130 mmol/L — ABNORMAL LOW (ref 135–145)

## 2019-03-03 MED ORDER — ATENOLOL 25 MG PO TABS
25.0000 mg | ORAL_TABLET | Freq: Every day | ORAL | Status: DC
  Administered 2019-03-04 – 2019-03-05 (×2): 25 mg via ORAL
  Filled 2019-03-03 (×2): qty 1

## 2019-03-03 NOTE — Progress Notes (Signed)
Assumed care of pt at 1500. Agree with previous nursing assessment. Pt had no concerns or complaints. Will continue to monitor the pt.

## 2019-03-03 NOTE — Progress Notes (Signed)
Occupational Therapy Treatment Patient Details Name: [name] MRN: [id number] DOB: March 15, 1921 Today's Date: 03/03/2019    History of present illness 85 year old female with history of diastolic CHF, hypertension, diet-controlled diabetes mellitus, history of PSVT, history of breast cancer, chronic hypoxic respiratory failure on home O2 was admitted to the hospital on 3/7 with 2 weeks of generalized progressive weakness and increased dyspnea on exertion.  She was found to be significantly fluid overloaded as well as chest x-ray in the ED showed large left-sided pleural effusion.  She was admitted for CHF exacerbation.   OT comments  PATIENT HAS HAD INCREASED CONFUSION PER SON AND NURSE OVER THE PAST 2 DAYS. PATIENT WAS VERY CONFUSED DURING THE SESSION. PATIENT WAS ABLE TO FOLLOW BASIC ONE STEP DIRECTIONS 50% OF TIME. PATIENT IS REQUIRING 2 PERSON ASSIST WITH TRANSFER WITH PATIENT LEANING POSTERIOR AND REQUIRING MAX CUES TO MOVE FEET. PATIENT WILL NEED FURTHER TRAINING ON LE DRESSING USING AE AND FOR TRANSFER TRAINING. ACUTE OT TO FOLLOW.   Follow Up Recommendations       Equipment Recommendations       Recommendations for Other Services      Precautions / Restrictions Precautions Precautions: Fall Precaution Comments: monitor O2 Restrictions Weight Bearing Restrictions: No       Mobility Bed Mobility         Supine to sit: Mod assist;HOB elevated Sit to supine: Max assist      Transfers       Sit to Stand: Max assist;+2 physical assistance Stand pivot transfers: Max assist;+2 physical assistance       General transfer comment: REQUIRED MAX CUES TO MOVE FEET FOR TRANSFER    Balance                                           ADL either performed or assessed with clinical judgement   ADL       Grooming: Wash/dry hands;Wash/dry face;Moderate assistance               Lower Body Dressing: Total assistance;+2 for physical assistance;Sit  to/from stand               Functional mobility during ADLs: Maximal assistance;+2 for physical assistance General ADL Comments: PATIENT REQUIRED EXTENSIVE ASSIST WITH ADLS SECONDARY TO CONFUSION.      Vision       Perception     Praxis      Cognition Arousal/Alertness: Awake/alert Behavior During Therapy: WFL for tasks assessed/performed Overall Cognitive Status: Impaired/Different from baseline Area of Impairment: (VERY CONFUSED. MAJOR DECLINE OVER THE PAST 2 DAYS PER SON )                                        Exercises     Shoulder Instructions       General Comments      Pertinent Vitals/ Pain       Pain Assessment: No/denies pain  Home Living                                          Prior Functioning/Environment              Frequency  Progress Toward Goals  OT Goals(current goals can now be found in the care plan section)  Progress towards OT goals: Not progressing toward goals - comment(PATIENT WITH INCREASED CONFUSION)  Acute Rehab OT Goals Patient Stated Goal: SON WANTS PATIENT CONFUSION TO GET BETTER  Plan Discharge plan remains appropriate    Co-evaluation                 AM-PAC OT "6 Clicks" Daily Activity     Outcome Measure   Help from another person eating meals?: A Lot Help from another person taking care of personal grooming?: A Lot Help from another person toileting, which includes using toliet, bedpan, or urinal?: Total Help from another person bathing (including washing, rinsing, drying)?: A Lot Help from another person to put on and taking off regular upper body clothing?: A Lot Help from another person to put on and taking off regular lower body clothing?: Total 6 Click Score: 10    End of Session        Activity Tolerance Patient tolerated treatment well   Patient Left in bed;with call bell/phone within reach;with bed alarm set   Nurse Communication (OK  THERAPY, TRANSFER STATUS)        Time: 4656-8127 OT Time Calculation (min): 27 min  Charges: OT General Charges $OT Visit: 1 Visit OT Treatments $Self Care/Home Management : 51-70 mins  6 CLICKS   Laquanta Hummel 03/03/2019, 4:13 PM

## 2019-03-03 NOTE — Progress Notes (Addendum)
Per SNF, the patient received a denial for rehab at [facility].  CSW reached out to the attending physician Pokhrel with information to complete a Peer to Peer.  Peer has to be complete before, 4:30PM. Call [phone] (Reference# [id number]) CSW updated the patient son Leroy Sea at bedside.  CSW informed the social worker Raquel Sarna and Scientist, research (physical sciences) at [employer] has requested New PT and OT notes for another review.  Nurse informed and placed orders.   Kathrin Greathouse, Marlinda Mike, MSW Clinical Social Worker  [phone] 03/03/2019  11:02 AM

## 2019-03-03 NOTE — Progress Notes (Signed)
PROGRESS NOTE  [name] NUU:[id number] DOB: [birth year] DOA: 02/21/2019 PCP: Virgie Dad, MD   LOS: 10 days   Brief narrative:  85 year old female with history of diastolic CHF, hypertension, diet-controlled diabetes mellitus, history of PSVT, history of breast cancer, chronic hypoxic respiratory failure on home O2 was admitted to the hospital on 3/7 with 2 weeks of generalized progressive weakness and increased dyspnea on exertion.  She was found to be significantly fluid overloaded and the chest x-ray in the ED showed large left-sided pleural effusion.    Patient underwent thoracocentesis with removal of 700 mL of fluid which was transudative in nature.  She was continued on IV diuretics with improvement in her symptoms.  Subsequently, diuretics were discontinued and gentle IV fluid was given due to mild dehydration from over-diuresis and hyponatremia.  Physical therapy evaluated patient and recommended skilled nursing facility.  At this time, peer-to-peer review has performed by me and  the insurance has requested further evaluation notes to consider for skilled nursing facility placement.  Subjective:  Spoke with the patient's son at bedside.  He reported that patient was talking about her past and appears to be hallucinating.  Assessment/Plan:  Active Problems:   Acute decompensated heart failure (HCC)  Acute on chronic hypoxic respiratory failure due to acute on chronic diastolic CHF -2D echocardiogram on 02/22/2019 showed normal ejection fraction with EF of 60 to 65% but increased RV systolic pressure.  A CT angiogram of the chest was performed which did not show any evidence of pulmonary embolism or pneumonia.  There was evidence of left-sided pleural effusion.  Patient underwent thoracocentesis with removal of 700 mL of fluid.  Lasix on hold due to hyponatremia and slight overdiuresis. Received  gentle IV fluid hydration on 02/27/2019 and the patient has been encouraged on oral fluids.   Pneumonia ruled out.  Patient has achieved a significant diuresis and weight loss during hospitalization.  Will need to be resumed on low-dose Lasix on discharge.  Mild confusion hallucinations possible hospital induced delirium.  Patient appears to be improving.  Patient is alert awake and communicative and does not feel sick.  Hyponatremia - Continue sodium chloride tablets from home.  On regular diet for the treatment.  She does have history of hyponatremia in the past as well.  Closely monitor    Mild volume depletion likely secondary to significant diuresis.. Improved with mild hydration.  Continue to hold Lasix for now.  Mild acute kidney injury likely from volume depletion.  Resolved at this time.  Continue to hold Lasix.    Mild troponin elevation -Flat, not in a pattern consistent with ACS, no chest pain.  Likely secondary to decompensated CHF.  Klebsiella UTI Completed the course of cefdinir.  Hypokalemia Resolved.  Hold off with further potassium doses.    Hypertension -on atenolol, losartan at home.  Will change atenolol to once a day with holding parameter for now.  Closely monitor blood pressure.  Deconditioning, debility. Patient does have potential for improvement.  Patient was much more independent prior to coming to the hospital and due to hospitalization and congestive heart failure she has deconditioned.  Physical therapy and occupational repeat has evaluated the patient and have recommended  skilled nursing facility for improving endurance, strengthening.  VTE Prophylaxis:  SCD  Code Status: DO NOT RESUSCITATE  Family Communication: I spoke with the patient's family member at bedside.  Disposition Plan:  SNF.  I did peer to peer review today because patient was denied for  skilled nursing facility.  The physician from insurance has requested updated physical therapy and Occupational Therapy notes.  I have spoken with social worker about this and need for  further documents.  Check BMP in a.m.  Consultants:  None  Procedures:  2D echo:   Ultrasound-guided thoracocentesis on 02/23/2019  Antibiotics: Anti-infectives (From admission, onward)   Start     Dose/Rate Route Frequency Ordered Stop   02/24/19 2015  azithromycin (ZITHROMAX) tablet 500 mg  Status:  Discontinued     500 mg Oral Daily 02/24/19 2001 02/25/19 1005   02/22/19 1000  cefdinir (OMNICEF) capsule 300 mg     300 mg Oral Every 12 hours 02/21/19 2036 02/24/19 2321   02/21/19 1900  cefTRIAXone (ROCEPHIN) 1 g in sodium chloride 0.9 % 100 mL IVPB     1 g 200 mL/hr over 30 Minutes Intravenous  Once 02/21/19 1851 02/21/19 1958     Objective: Vitals:   03/03/19 0416 03/03/19 1358  BP: (!) 104/44 (!) 134/55  Pulse: 62 (!) 58  Resp: 18 16  Temp: 98.7 F (37.1 C) 98.1 F (36.7 C)  SpO2: 92% 94%    Intake/Output Summary (Last 24 hours) at 03/03/2019 1440 Last data filed at 03/03/2019 0600 Gross per 24 hour  Intake 63 ml  Output 1000 ml  Net -937 ml   Filed Weights   03/01/19 0534 03/02/19 0622 03/03/19 0419  Weight: 62 kg 56.3 kg 56.8 kg   Body mass index is 22.18 kg/m.   Physical Exam: General: Thinly built, not in obvious distress.  Nasal cannula.  Alert awake and communicative. HENT: Normocephalic, pupils equally reacting to light and accommodation.  No scleral pallor or icterus noted. Oral mucosa is moist.  Chest: Diminished breath sounds bilaterally.  No audible wheeze CVS: S1 &S2 heard. No murmur.  Regular rate and rhythm. Abdomen: Soft, nontender, nondistended.  Bowel sounds are heard.  Liver is not palpable, no abdominal mass palpated Extremities: No cyanosis, clubbing or edema.  Peripheral pulses are palpable. Psych: Alert, awake and communicative.  Patient is oriented to place and person at the time of my exam but is talking about her past and the family states that she was mildly hallucinating. CNS:  No cranial nerve deficits.  Moves all extremities..     Skin: Warm and dry.  No rashes noted.  Data Review: I have personally reviewed the following laboratory data and studies,  CBC: Recent Labs  Lab 02/26/19 0348 02/27/19 0345 02/28/19 0356 03/01/19 0430 03/02/19 0433  WBC 9.7 9.9 6.8 7.3 7.1  HGB 12.4 12.9 13.0 11.5* 11.6*  HCT 38.8 41.0 41.3 35.3* 35.9*  MCV 100.3* 101.7* 102.5* 100.0 101.1*  PLT 218 218 210 244 846   Basic Metabolic Panel: Recent Labs  Lab 02/25/19 0442 02/26/19 0348 02/27/19 0345 02/28/19 0356 03/01/19 0430 03/02/19 0433 03/03/19 0408  NA 132* 129* 128* 127* 128* 129* 130*  K 4.9 5.2* 4.8 4.5 4.2 4.6 4.1  CL 84* 83* 85* 87* 89* 92* 93*  CO2 38* 37* 33* 29 29 29 30   GLUCOSE 98 89 91 85 90 89 137*  BUN 21 21 29* 30* 29* 27* 24*  CREATININE 0.92 0.92 1.07* 0.94 0.95 0.84 0.87  CALCIUM 9.0 8.9 9.1 8.7* 8.6* 8.8* 9.0  MG 1.7 1.7 1.6* 1.9  --   --   --    Liver Function Tests: No results for input(s): AST, ALT, ALKPHOS, BILITOT, PROT, ALBUMIN in the last 168 hours. No results for input(s):  LIPASE, AMYLASE in the last 168 hours. No results for input(s): AMMONIA in the last 168 hours. Cardiac Enzymes: No results for input(s): CKTOTAL, CKMB, CKMBINDEX, TROPONINI in the last 168 hours. BNP (last 3 results) Recent Labs    02/21/19 1721  BNP 2,482.2*    ProBNP (last 3 results) No results for input(s): PROBNP in the last 8760 hours.  CBG: No results for input(s): GLUCAP in the last 168 hours. Recent Results (from the past 240 hour(s))  Urine culture     Status: Abnormal   Collection Time: 02/21/19  4:32 PM  Result Value Ref Range Status   Specimen Description   Final    URINE, RANDOM Performed at [facility] [address], [place], [place] [address]    Special Requests   Final    NONE Performed at [facility], [place] [address], [place], [place] [address]    Culture >=100,000 COLONIES/mL KLEBSIELLA SPECIES (A)  Final   Report Status 02/24/2019 FINAL   Final   Organism ID, Bacteria KLEBSIELLA SPECIES (A)  Final      Susceptibility   Klebsiella species - MIC*    AMPICILLIN RESISTANT Resistant     CEFAZOLIN <=4 SENSITIVE Sensitive     CEFTRIAXONE <=1 SENSITIVE Sensitive     CIPROFLOXACIN <=0.25 SENSITIVE Sensitive     GENTAMICIN <=1 SENSITIVE Sensitive     IMIPENEM <=0.25 SENSITIVE Sensitive     NITROFURANTOIN <=16 SENSITIVE Sensitive     TRIMETH/SULFA <=20 SENSITIVE Sensitive     AMPICILLIN/SULBACTAM <=2 SENSITIVE Sensitive     PIP/TAZO <=4 SENSITIVE Sensitive     Extended ESBL NEGATIVE Sensitive     * >=100,000 COLONIES/mL KLEBSIELLA SPECIES  Culture, blood (routine x 2)     Status: None   Collection Time: 02/21/19  5:45 PM  Result Value Ref Range Status   Specimen Description   Final    BLOOD RIGHT FOREARM Performed at [facility] [address], [place], [place] [address]    Special Requests   Final    BOTTLES DRAWN AEROBIC AND ANAEROBIC Blood Culture adequate volume Performed at [facility] [address], [place], [place] [address]    Culture   Final    NO GROWTH 5 DAYS Performed at [facility], [place] [address] [place], [place] [address]    Report Status 02/26/2019 FINAL  Final  Culture, blood (routine x 2)     Status: None   Collection Time: 02/21/19  9:40 PM  Result Value Ref Range Status   Specimen Description   Final    BLOOD RIGHT ANTECUBITAL Performed at [facility] [address], [place], [place] [address]    Special Requests   Final    BOTTLES DRAWN AEROBIC ONLY Blood Culture adequate volume Performed at [facility] [address] [place], [place] [address]    Culture   Final    NO GROWTH 5 DAYS Performed at [facility], [place] [address], [place], [place] [address]    Report Status 02/26/2019 FINAL  Final  MRSA PCR Screening     Status: None   Collection Time: 02/21/19 11:53 PM  Result Value Ref Range  Status   MRSA by PCR NEGATIVE NEGATIVE Final    Comment:        The GeneXpert MRSA Assay (FDA approved for NASAL specimens only), is one component of a comprehensive MRSA colonization surveillance program. It is not intended  to diagnose MRSA infection nor to guide or monitor treatment for MRSA infections. Performed at [facility], [place] [address], [place], [place] [address]   Culture, body fluid-bottle     Status: None   Collection Time: 02/23/19  4:31 PM  Result Value Ref Range Status   Specimen Description FLUID PLEURAL LEFT  Final   Special Requests BOTTLES DRAWN AEROBIC AND ANAEROBIC  Final   Culture   Final    NO GROWTH 5 DAYS Performed at [facility], [place] [address] [place], [place] [address]    Report Status 02/28/2019 FINAL  Final  Gram stain     Status: None   Collection Time: 02/23/19  4:31 PM  Result Value Ref Range Status   Specimen Description FLUID PLEURAL LEFT  Final   Special Requests NONE  Final   Gram Stain   Final    WBC PRESENT,BOTH PMN AND MONONUCLEAR NO ORGANISMS SEEN CYTOSPIN SMEAR Performed at [facility], [place] [address] [place], [place] [address]    Report Status 02/24/2019 FINAL  Final     Studies: No results found.  Scheduled Meds:  B-complex with vitamin C  1 tablet Oral Daily   dorzolamide-timolol  1 drop Left Eye BID   DULoxetine  30 mg Oral Daily   feeding supplement  237 mL Oral Q lunch   ferrous gluconate  324 mg Oral Q breakfast   folic acid  0.5 mg Oral Daily   gabapentin  100 mg Oral BID   latanoprost  1 drop Both Eyes QHS   losartan  50 mg Oral Daily   magnesium oxide  400 mg Oral BID   multivitamin  1 tablet Oral Daily   pantoprazole  40 mg Oral Daily   sodium chloride flush  3 mL Intravenous Q12H   sodium chloride  1 g Oral TID WC    Continuous Infusions:    Flora Lipps, MD  [facility] 03/03/2019

## 2019-03-04 DIAGNOSIS — N390 Urinary tract infection, site not specified: Secondary | ICD-10-CM

## 2019-03-04 DIAGNOSIS — N3000 Acute cystitis without hematuria: Secondary | ICD-10-CM

## 2019-03-04 DIAGNOSIS — E871 Hypo-osmolality and hyponatremia: Secondary | ICD-10-CM

## 2019-03-04 DIAGNOSIS — B961 Klebsiella pneumoniae [K. pneumoniae] as the cause of diseases classified elsewhere: Secondary | ICD-10-CM

## 2019-03-04 LAB — BASIC METABOLIC PANEL
Anion gap: 9 (ref 5–15)
BUN: 20 mg/dL (ref 8–23)
CO2: 29 mmol/L (ref 22–32)
Calcium: 9.2 mg/dL (ref 8.9–10.3)
Chloride: 92 mmol/L — ABNORMAL LOW (ref 98–111)
Creatinine, Ser: 0.86 mg/dL (ref 0.44–1.00)
GFR calc Af Amer: >60 mL/min (ref >60)
GFR calc non Af Amer: 57 mL/min — ABNORMAL LOW (ref >60)
Glucose, Bld: 102 mg/dL — ABNORMAL HIGH (ref 70–99)
Potassium: 4.1 mmol/L (ref 3.5–5.1)
SODIUM: 130 mmol/L — AB (ref 135–145)

## 2019-03-04 LAB — CBC
HCT: 39.1 % (ref 36.0–46.0)
Hemoglobin: 12.2 g/dL (ref 12.0–15.0)
MCH: 31.9 pg (ref 26.0–34.0)
MCHC: 31.2 g/dL (ref 30.0–36.0)
MCV: 102.1 fL — ABNORMAL HIGH (ref 80.0–100.0)
Platelets: 252 10*3/uL (ref 150–400)
RBC: 3.83 MIL/uL — ABNORMAL LOW (ref 3.87–5.11)
RDW: 15.9 % — ABNORMAL HIGH (ref 11.5–15.5)
WBC: 6.9 10*3/uL (ref 4.0–10.5)
nRBC: 0 % (ref 0.0–0.2)

## 2019-03-04 LAB — MAGNESIUM: Magnesium: 1.9 mg/dL (ref 1.7–2.4)

## 2019-03-04 NOTE — Progress Notes (Signed)
PROGRESS NOTE  [name] ZOX:[id number] DOB: [birth year] DOA: 02/21/2019 PCP: Virgie Dad, MD   LOS: 11 days   Brief narrative:  85 year old female with history of diastolic CHF, hypertension, diet-controlled diabetes mellitus, history of PSVT, history of breast cancer, chronic hypoxic respiratory failure on home O2 was admitted to the hospital on 3/7 with 2 weeks of generalized progressive weakness and increased dyspnea on exertion.  She was found to be significantly fluid overloaded and the chest x-ray in the ED showed large left-sided pleural effusion.    Patient underwent thoracocentesis with removal of 700 mL of fluid which was transudative in nature.  She was continued on IV diuretics with improvement in her symptoms.  Subsequently, diuretics were discontinued and gentle IV fluid was given due to mild dehydration from over-diuresis and hyponatremia.  Physical therapy evaluated patient and recommended skilled nursing facility.  At this time, peer-to-peer review has performed by me and  the insurance has requested further evaluation notes to consider for skilled nursing facility placement.  Subjective:  Patient seen and examined at bedside, resting comfortably in bed.  Son present.  Son states was concerned few days about about her intermittent confusion.  Likely related to hospital-acquired delirium.  Patient without any complaints this morning.  Insurance initially declined SNF authorization, peer-to-peer completed yesterday.  Awaiting to hear back from insurance regarding review.  Patient denies headache, no fever/chills/night sweats, no nausea/vomiting/diarrhea, no chest pain, palpitations, no shortness of breath, no abdominal pain.  No acute events overnight per nursing staff.  Assessment/Plan:  Active Problems:   Acute decompensated heart failure (HCC)  Acute on chronic hypoxic respiratory failure due to acute on chronic diastolic CHF 2D echocardiogram on 02/22/2019 showed normal  ejection fraction with EF of 60 to 65% but increased RV systolic pressure.  A CT angiogram of the chest was performed which did not show any evidence of pulmonary embolism or pneumonia.  There was evidence of left-sided pleural effusion.  Patient underwent thoracentesis with removal of 700 mL of fluid.  Lasix on hold due to hyponatremia and slight overdiuresis. Received  gentle IV fluid hydration on 02/27/2019 and the patient has been encouraged to increase oral fluids.  Likely to resume low-dose diuretic on discharge.  Mild confusion hallucinations possible hospital induced delirium.  Patient appears to be improving.  Patient is alert awake and communicative.  No issues reported overnight  Hyponatremia Patient with a history of hyponatremia, likely secondary to volume overload. --Regular diet -Continue sodium chloride tablets from home.   --Monitor sodium daily   Mild volume depletion likely secondary to significant diuresis.. Improved with mild hydration.  Continue to hold Lasix for now.  Mild acute kidney injury  Etiology likely from volume depletion.  Resolved at this time.   --Continue to hold Lasix.    Mild troponin elevation --Flat, not in a pattern consistent with ACS, no chest pain.  Likely secondary to decompensated CHF.  Klebsiella UTI --Completed course of cefdinir.  Hypokalemia Resolved.  Hold off with further potassium doses.   --BMP daily  Hypertension on atenolol, losartan at home.   --Will change atenolol to once a day with holding parameter for now.  -- Closely monitor blood pressure.  Deconditioning, debility. Patient does have potential for improvement.  Patient was much more independent prior to coming to the hospital and due to hospitalization and congestive heart failure she has deconditioned.  Physical therapy and occupational repeat has evaluated the patient and have recommended  skilled nursing facility for  improving endurance, strengthening.  VTE  Prophylaxis:  SCD Code Status: DO NOT RESUSCITATE Family Communication: I spoke with the patient's family member at bedside. Disposition Plan:  SNF.  Peer-to-peer performed on 03/03/2019 with patient's insurance company.  Insurance has requested updated physical therapy and Occupational Therapy notes which have been faxed per case management.  Awaiting for further review.  Medically ready for discharge.  Consultants:  None  Procedures:  2D echo:   Ultrasound-guided thoracocentesis on 02/23/2019  Antibiotics: Anti-infectives (From admission, onward)   Start     Dose/Rate Route Frequency Ordered Stop   02/24/19 2015  azithromycin (ZITHROMAX) tablet 500 mg  Status:  Discontinued     500 mg Oral Daily 02/24/19 2001 02/25/19 1005   02/22/19 1000  cefdinir (OMNICEF) capsule 300 mg     300 mg Oral Every 12 hours 02/21/19 2036 02/24/19 2321   02/21/19 1900  cefTRIAXone (ROCEPHIN) 1 g in sodium chloride 0.9 % 100 mL IVPB     1 g 200 mL/hr over 30 Minutes Intravenous  Once 02/21/19 1851 02/21/19 1958     Objective: Vitals:   03/04/19 0527 03/04/19 1336  BP: (!) 161/74 (!) 123/98  Pulse: 64 63  Resp: 16 16  Temp: 97.9 F (36.6 C) 97.7 F (36.5 C)  SpO2: 92% 96%    Intake/Output Summary (Last 24 hours) at 03/04/2019 1413 Last data filed at 03/04/2019 1330 Gross per 24 hour  Intake 1153 ml  Output 1500 ml  Net -347 ml   Filed Weights   03/02/19 0622 03/03/19 0419 03/04/19 0527  Weight: 56.3 kg 56.8 kg 58.2 kg   Body mass index is 22.73 kg/m.   Physical Exam: General: Thinly built, not in obvious distress.  Nasal cannula.  Alert awake and communicative. HENT: Normocephalic, pupils equally reacting to light and accommodation.  No scleral pallor or icterus noted. Oral mucosa is moist.  Chest: Diminished breath sounds bilaterally.  No audible wheeze CVS: S1 &S2 heard. No murmur.  Regular rate and rhythm. Abdomen: Soft, nontender, nondistended.  Bowel sounds are heard.  Liver is  not palpable, no abdominal mass palpated Extremities: No cyanosis, clubbing or edema.  Peripheral pulses are palpable. Psych: Alert, awake and communicative.  Patient is oriented to place and person at the time of my exam but is talking about her past and the family states that she was mildly hallucinating. CNS:  No cranial nerve deficits.  Moves all extremities..   Skin: Warm and dry.  No rashes noted.  Data Review: I have personally reviewed the following laboratory data and studies,  CBC: Recent Labs  Lab 02/27/19 0345 02/28/19 0356 03/01/19 0430 03/02/19 0433 03/04/19 0325  WBC 9.9 6.8 7.3 7.1 6.9  HGB 12.9 13.0 11.5* 11.6* 12.2  HCT 41.0 41.3 35.3* 35.9* 39.1  MCV 101.7* 102.5* 100.0 101.1* 102.1*  PLT 218 210 244 251 510   Basic Metabolic Panel: Recent Labs  Lab 02/26/19 0348 02/27/19 0345 02/28/19 0356 03/01/19 0430 03/02/19 0433 03/03/19 0408 03/04/19 0325  NA 129* 128* 127* 128* 129* 130* 130*  K 5.2* 4.8 4.5 4.2 4.6 4.1 4.1  CL 83* 85* 87* 89* 92* 93* 92*  CO2 37* 33* 29 29 29 30 29   GLUCOSE 89 91 85 90 89 137* 102*  BUN 21 29* 30* 29* 27* 24* 20  CREATININE 0.92 1.07* 0.94 0.95 0.84 0.87 0.86  CALCIUM 8.9 9.1 8.7* 8.6* 8.8* 9.0 9.2  MG 1.7 1.6* 1.9  --   --   --  1.9   Liver Function Tests: No results for input(s): AST, ALT, ALKPHOS, BILITOT, PROT, ALBUMIN in the last 168 hours. No results for input(s): LIPASE, AMYLASE in the last 168 hours. No results for input(s): AMMONIA in the last 168 hours. Cardiac Enzymes: No results for input(s): CKTOTAL, CKMB, CKMBINDEX, TROPONINI in the last 168 hours. BNP (last 3 results) Recent Labs    02/21/19 1721  BNP 2,482.2*    ProBNP (last 3 results) No results for input(s): PROBNP in the last 8760 hours.  CBG: No results for input(s): GLUCAP in the last 168 hours. Recent Results (from the past 240 hour(s))  Culture, body fluid-bottle     Status: None   Collection Time: 02/23/19  4:31 PM  Result Value Ref  Range Status   Specimen Description FLUID PLEURAL LEFT  Final   Special Requests BOTTLES DRAWN AEROBIC AND ANAEROBIC  Final   Culture   Final    NO GROWTH 5 DAYS Performed at [facility], [place] [address] [place], [place] [address]    Report Status 02/28/2019 FINAL  Final  Gram stain     Status: None   Collection Time: 02/23/19  4:31 PM  Result Value Ref Range Status   Specimen Description FLUID PLEURAL LEFT  Final   Special Requests NONE  Final   Gram Stain   Final    WBC PRESENT,BOTH PMN AND MONONUCLEAR NO ORGANISMS SEEN CYTOSPIN SMEAR Performed at [facility], [place] [address] [place], [place] [address]    Report Status 02/24/2019 FINAL  Final     Studies: No results found.  Scheduled Meds:  atenolol  25 mg Oral Daily   B-complex with vitamin C  1 tablet Oral Daily   dorzolamide-timolol  1 drop Left Eye BID   DULoxetine  30 mg Oral Daily   feeding supplement  237 mL Oral Q lunch   ferrous gluconate  324 mg Oral Q breakfast   folic acid  0.5 mg Oral Daily   gabapentin  100 mg Oral BID   latanoprost  1 drop Both Eyes QHS   losartan  50 mg Oral Daily   magnesium oxide  400 mg Oral BID   multivitamin  1 tablet Oral Daily   pantoprazole  40 mg Oral Daily   sodium chloride flush  3 mL Intravenous Q12H   sodium chloride  1 g Oral TID WC    Continuous Infusions:    Donnamarie Poag [place] DO  [facility] Pager [phone]   If 7PM-7AM, please contact night-coverage [email or web address] Password [facility]  03/04/2019

## 2019-03-04 NOTE — Progress Notes (Signed)
Occupational Therapy Treatment Patient Details Name: [name] MRN: [id number] DOB: 07/21/1921 Today's Date: 03/04/2019    History of present illness 85 year old female with history of diastolic CHF, hypertension, diet-controlled diabetes mellitus, history of PSVT, history of breast cancer, chronic hypoxic respiratory failure on home O2 was admitted to the hospital on 3/7 with 2 weeks of generalized progressive weakness and increased dyspnea on exertion.  She was found to be significantly fluid overloaded as well as chest x-ray in the ED showed large left-sided pleural effusion.  She was admitted for CHF exacerbation.   OT comments  Able to perform SPT and use BSC. Needs +2 for clothing management.  Pt follows commands with extra time, but unsafe releasing hand from walker and holding to other surfaces.  Did better standing with RW vs hand held assist.  Follow Up Recommendations  SNF    Equipment Recommendations  None recommended by OT    Recommendations for Other Services      Precautions / Restrictions Precautions Precautions: Fall Precaution Comments: monitor O2 Restrictions Weight Bearing Restrictions: No       Mobility Bed Mobility         Supine to sit: Mod assist Sit to supine: Mod assist   General bed mobility comments: assist for trunk for OOB and legs for back to bed. HOB raised  Transfers   Equipment used: Rolling walker (2 wheeled)   Sit to Stand: Mod assist;From elevated surface Stand pivot transfers: Mod assist       General transfer comment: 2 attempts to stand;  Pt  let go of walker when standing; +2 safety recommended    Balance     Sitting balance-Leahy Scale: Fair       Standing balance-Leahy Scale: Poor                             ADL either performed or assessed with clinical judgement   ADL       Grooming: Brushing hair;Set up;Sitting                   Toilet Transfer: Moderate  assistance;Stand-pivot;BSC;Requires wide/bariatric   Toileting- Clothing Manipulation and Hygiene: Total assistance;+2 for safety/equipment;Sit to/from stand(hygiene; sitting; pants sit to stand)         General ADL Comments: able to perform SPT to [facility] with RW. Stood initially without this and pt was leaning posteriorly. She had a tendency to let go of walker and reach for [facility].  Able to redirect with extra time     Vision       Perception     Praxis      Cognition Arousal/Alertness: Awake/alert Behavior During Therapy: [facility] for tasks assessed/performed Overall Cognitive Status: Impaired/Different from baseline                                 General Comments: talking about golf and a family argument from 45 years ago per son.  Talked constantly:  redirectable to task.  Pt able to state that her OT had her stay steps before she moved        Exercises     Shoulder Instructions       General Comments      Pertinent Vitals/ Pain       Pain Assessment: Faces Faces Pain Scale: No hurt  Home Living  Prior Functioning/Environment              Frequency  Min 2X/week        Progress Toward Goals  OT Goals(current goals can now be found in the care plan section)  Progress towards OT goals: Progressing toward goals  Acute Rehab OT Goals Patient Stated Goal: SON WANTS PATIENT CONFUSION TO GET BETTER  Plan      Co-evaluation                 AM-PAC OT "6 Clicks" Daily Activity     Outcome Measure   Help from another person eating meals?: A Little Help from another person taking care of personal grooming?: A Little Help from another person toileting, which includes using toliet, bedpan, or urinal?: A Lot Help from another person bathing (including washing, rinsing, drying)?: A Lot Help from another person to put on and taking off regular upper body clothing?: A Lot Help from  another person to put on and taking off regular lower body clothing?: Total 6 Click Score: 13    End of Session    OT Visit Diagnosis: Muscle weakness (generalized) (M62.81)   Activity Tolerance Patient tolerated treatment well   Patient Left in bed;with call bell/phone within reach;with bed alarm set;with family/visitor present   Nurse Communication          Time: 6060-0459 OT Time Calculation (min): 29 min  Charges: OT General Charges $OT Visit: 1 Visit OT Treatments $Self Care/Home Management : 23-37 mins  Lesle Chris, OTR/L Acute Rehabilitation Services [phone] [facility] pager [phone] office 03/04/2019   [place] 03/04/2019, 9:45 AM

## 2019-03-04 NOTE — Progress Notes (Signed)
Physical Therapy Treatment Patient Details Name: [name] MRN: [id number] DOB: 1921-02-20 Today's Date: 03/04/2019    History of Present Illness 85 year old female with history of diastolic CHF, hypertension, diet-controlled diabetes mellitus, history of PSVT, history of breast cancer, chronic hypoxic respiratory failure on home O2 was admitted to the hospital on 3/7 with 2 weeks of generalized progressive weakness and increased dyspnea on exertion.  She was found to be significantly fluid overloaded as well as chest x-ray in the ED showed large left-sided pleural effusion.  She was admitted for CHF exacerbation.    PT Comments    Pt OOB on BSC with NT.  Pt did require + 2 side by side assist to stand.  + 2 side by side assist for amb.  General Gait Details: + 2 side by side assist from [facility] to attempt amb.  Very weak.  Unsteady.  Increased c/o R knee pain with activity.  Poor balance.  Limited distance.   Pt will need ST Rehab at SNF prior to returning to ALF  Follow Up Recommendations  SNF     Equipment Recommendations  None recommended by PT    Recommendations for Other Services       Precautions / Restrictions Precautions Precautions: Fall Precaution Comments: monitor O2 Restrictions Weight Bearing Restrictions: No    Mobility  Bed Mobility         Supine to sit: Mod assist Sit to supine: Mod assist   General bed mobility comments: OOB on BSC with NT  Transfers Overall transfer level: Needs assistance Equipment used: Rolling walker (2 wheeled) Transfers: Sit to/from [facility] Sit to Stand: Max assist;+2 physical assistance;+2 safety/equipment Stand pivot transfers: Max assist;+2 physical assistance;+2 safety/equipment       General transfer comment: 2 attempts to stand;  Pt  let go of walker when standing; +2 safety recommended  Ambulation/Gait Ambulation/Gait assistance: Mod assist;+2 physical assistance;+2 safety/equipment Gait Distance  (Feet): 2 Feet Assistive device: Rolling walker (2 wheeled) Gait Pattern/deviations: Step-to pattern;Trunk flexed;Antalgic     General Gait Details: + 2 side by side assist from [facility] to attempt amb.  Very weak.  Unsteady.  Increased c/o R knee pain with activity.  Poor balance.  Limited distance.     Stairs             Wheelchair Mobility    Modified Rankin (Stroke Patients Only)       Balance     Sitting balance-Leahy Scale: Fair       Standing balance-Leahy Scale: Poor                              Cognition Arousal/Alertness: Awake/alert Behavior During Therapy: WFL for tasks assessed/performed Overall Cognitive Status: Impaired/Different from baseline                                 General Comments: following all commands and able to express needs (bathroom)       Exercises      General Comments        Pertinent Vitals/Pain Pain Assessment: Faces Faces Pain Scale: Hurts little more Pain Location: R knee Pain Descriptors / Indicators: Grimacing;Tender Pain Intervention(s): Monitored during session;Repositioned    Home Living                      Prior Function  PT Goals (current goals can now be found in the care plan section) Acute Rehab PT Goals Patient Stated Goal: SON WANTS PATIENT CONFUSION TO GET BETTER Progress towards PT goals: Progressing toward goals    Frequency    Min 2X/week      PT Plan Current plan remains appropriate    Co-evaluation              AM-PAC PT "6 Clicks" Mobility   Outcome Measure  Help needed turning from your back to your side while in a flat bed without using bedrails?: Total Help needed moving from lying on your back to sitting on the side of a flat bed without using bedrails?: Total Help needed moving to and from a bed to a chair (including a wheelchair)?: Total Help needed standing up from a chair using your arms (e.g., wheelchair or bedside  chair)?: Total Help needed to walk in hospital room?: Total Help needed climbing 3-5 steps with a railing? : Total 6 Click Score: 6    End of Session Equipment Utilized During Treatment: Gait belt Activity Tolerance: Patient tolerated treatment well Patient left: in chair;with call bell/phone within reach;with chair alarm set;with family/visitor present Nurse Communication: Mobility status PT Visit Diagnosis: Unsteadiness on feet (R26.81);Muscle weakness (generalized) (M62.81);Difficulty in walking, not elsewhere classified (R26.2);Pain Pain - Right/Left: Right Pain - part of body: Knee     Time: 0258-5277 PT Time Calculation (min) (ACUTE ONLY): 17 min  Charges:  $Therapeutic Activity: 8-22 mins                     Rica Koyanagi  PTA Acute  Rehabilitation Services Pager      [phone] Office      [phone]

## 2019-03-04 NOTE — Progress Notes (Signed)
Patient clinicals  faxed to [facility]  CSW will update the staff and family once reviewed and decision has been made by the insurance company.   Kathrin Greathouse, Marlinda Mike, MSW Clinical Social Worker  [phone] 03/04/2019  12:57 PM

## 2019-03-05 DIAGNOSIS — R5381 Other malaise: Secondary | ICD-10-CM

## 2019-03-05 LAB — BASIC METABOLIC PANEL
Anion gap: 9 (ref 5–15)
BUN: 21 mg/dL (ref 8–23)
CO2: 25 mmol/L (ref 22–32)
Calcium: 8.9 mg/dL (ref 8.9–10.3)
Chloride: 95 mmol/L — ABNORMAL LOW (ref 98–111)
Creatinine, Ser: 0.81 mg/dL (ref 0.44–1.00)
GFR calc Af Amer: >60 mL/min (ref >60)
GFR calc non Af Amer: >60 mL/min (ref >60)
Glucose, Bld: 84 mg/dL (ref 70–99)
Potassium: 4.4 mmol/L (ref 3.5–5.1)
SODIUM: 129 mmol/L — AB (ref 135–145)

## 2019-03-05 LAB — MAGNESIUM: MAGNESIUM: 1.9 mg/dL (ref 1.7–2.4)

## 2019-03-05 MED ORDER — SODIUM FLUORIDE 1.1 % DT PSTE: PASTE | DENTAL | 0 refills | Status: DC

## 2019-03-05 MED ORDER — FOLIC ACID 400 MCG PO TABS: 400.0000 ug | ORAL_TABLET | Freq: Every day | ORAL | 0 refills | Status: DC

## 2019-03-05 MED ORDER — B COMPLEX-B12 PO TABS: 1.0000 | ORAL_TABLET | Freq: Every day | ORAL | 0 refills | Status: DC

## 2019-03-05 MED ORDER — FERROUS GLUCONATE 240 (27 FE) MG PO TABS: 240.0000 mg | ORAL_TABLET | Freq: Every day | ORAL | 0 refills | Status: DC

## 2019-03-05 MED ORDER — MAGNESIUM OXIDE 400 (241.3 MG) MG PO TABS: 400.0000 mg | ORAL_TABLET | Freq: Two times a day (BID) | ORAL | 0 refills | Status: DC

## 2019-03-05 MED ORDER — POTASSIUM CHLORIDE CRYS ER 20 MEQ PO TBCR: 40.0000 meq | EXTENDED_RELEASE_TABLET | Freq: Every day | ORAL | 0 refills | Status: DC

## 2019-03-05 MED ORDER — ATENOLOL 25 MG PO TABS: 25.0000 mg | ORAL_TABLET | Freq: Every day | ORAL | 0 refills | Status: DC

## 2019-03-05 MED ORDER — SIMETHICONE 80 MG PO TABS: 1.0000 | ORAL_TABLET | Freq: Four times a day (QID) | ORAL | 0 refills | Status: DC | PRN

## 2019-03-05 MED ORDER — DORZOLAMIDE HCL-TIMOLOL MAL 2-0.5 % OP SOLN: 1.0000 [drp] | Freq: Two times a day (BID) | OPHTHALMIC | 12 refills | Status: DC

## 2019-03-05 MED ORDER — POLYETHYLENE GLYCOL 3350 17 G PO PACK: 17.0000 g | PACK | Freq: Every day | ORAL | 0 refills | Status: DC | PRN

## 2019-03-05 MED ORDER — LIDOCAINE 5 % EX PTCH: 1.0000 | MEDICATED_PATCH | Freq: Every day | CUTANEOUS | 0 refills | Status: DC | PRN

## 2019-03-05 MED ORDER — LOSARTAN POTASSIUM 50 MG PO TABS: 50.0000 mg | ORAL_TABLET | Freq: Every day | ORAL | 0 refills | Status: DC

## 2019-03-05 MED ORDER — SODIUM CHLORIDE 0.9 % IV BOLUS
500.0000 mL | Freq: Once | INTRAVENOUS | Status: AC
  Administered 2019-03-05: 500 mL via INTRAVENOUS

## 2019-03-05 MED ORDER — MENTHOL (TOPICAL ANALGESIC) 4 % EX GEL: 1.0000 "application " | Freq: Three times a day (TID) | CUTANEOUS | 0 refills | Status: DC | PRN

## 2019-03-05 MED ORDER — OMEPRAZOLE 20 MG PO CPDR: 20.0000 mg | DELAYED_RELEASE_CAPSULE | Freq: Every day | ORAL | 1 refills | Status: DC

## 2019-03-05 MED ORDER — DULOXETINE HCL 30 MG PO CPEP: 30.0000 mg | ORAL_CAPSULE | Freq: Every day | ORAL | 3 refills | Status: DC

## 2019-03-05 MED ORDER — LATANOPROST 0.005 % OP SOLN: 1.0000 [drp] | Freq: Every day | OPHTHALMIC | 12 refills | Status: AC

## 2019-03-05 MED ORDER — GABAPENTIN 100 MG PO CAPS: 100.0000 mg | ORAL_CAPSULE | Freq: Two times a day (BID) | ORAL | 0 refills | Status: DC

## 2019-03-05 MED ORDER — SODIUM CHLORIDE 1 G PO TABS: 1.0000 g | ORAL_TABLET | Freq: Three times a day (TID) | ORAL | 0 refills | Status: DC

## 2019-03-05 MED ORDER — IPRATROPIUM-ALBUTEROL 0.5-2.5 (3) MG/3ML IN SOLN: 3.0000 mL | Freq: Two times a day (BID) | RESPIRATORY_TRACT | 0 refills | Status: DC | PRN

## 2019-03-05 MED ORDER — SODIUM CHLORIDE 1 G PO TABS: 1.0000 g | ORAL_TABLET | Freq: Three times a day (TID) | ORAL | 0 refills | Status: AC

## 2019-03-05 MED ORDER — ATENOLOL 25 MG PO TABS: 25.0000 mg | ORAL_TABLET | Freq: Every day | ORAL | 0 refills | Status: AC

## 2019-03-05 MED ORDER — PRESERVISION AREDS 2 PO CAPS: ORAL_CAPSULE | ORAL | 0 refills | Status: DC

## 2019-03-05 NOTE — NC FL2 (Addendum)
[place] LEVEL OF CARE SCREENING TOOL     IDENTIFICATION  Patient Name: [name] Birthdate: 10/16/1921 Sex: female Admission Date (Current Location): 02/21/2019  [facility] and [place] Number:  Herbalist and Address:  [facility],  [address] [place], [place]      Provider Number: [id number]  Attending Physician Name and Address:  [place] Donnamarie Poag, DO  Relative Name and Phone Number:       Current Level of Care: Hospital Recommended Level of Care: [place] Prior Approval Number:    Date Approved/Denied:   PASRR Number: [id number]  Discharge Plan: SNF    Current Diagnoses: Patient Active Problem List   Diagnosis Date Noted  . Acute decompensated heart failure ([place]) 02/21/2019  . Amaurosis fugax of right eye 01/15/2019  . Vaginal discharge 01/14/2019  . Glaucoma 01/14/2019  . Macular degeneration 01/14/2019  . Chronic diastolic CHF (congestive heart failure) ([place]) 08/13/2018  . Hypertensive urgency 08/09/2018  . Pulmonary hypertension, primary ([place]) 07/31/2018  . Congestive heart failure (CHF) ([place]) 07/24/2018  . Yeast vaginitis 07/23/2018  . Chronic respiratory failure with hypoxia ([place]) 06/20/2018  . Neuropathy 06/20/2018  . Paroxysmal atrial fibrillation ([place]) 06/20/2018  . Hypokalemia 03/26/2018  . Bloated abdomen 03/21/2018  . Post-nasal drip 01/21/2018  . Iron deficiency 01/21/2018  . Fall 01/09/2018  . Coccyalgia 01/09/2018  . History of GI diverticular bleed 12/25/2017  . Protein-calorie malnutrition ([place]) 12/05/2017  . Advanced care planning/counseling discussion 11/20/2017  . Unsteady gait 11/19/2017  . TSH elevation 11/19/2017  . Permanent atrial fibrillation 11/19/2017  . Melena   . Diverticulosis of colon with hemorrhage   . Hematochezia 11/03/2017  . Decrease in appetite 04/03/2017  . Generalized weakness 04/03/2017  . Osteopenia 10/25/2016  . Hyponatremia 07/09/2015  . Hearing  loss of both ears 04/08/2014  . HTN (hypertension) 03/27/2014  . Constipation 03/27/2014  . Skin lesion of face 03/27/2014  . Bronchiectasis ([place])   . Rotator cuff syndrome of right shoulder 09/30/2012  . Breast cancer ([place])   . Hx of radiation therapy   . Lung cancer, lower lobe ([place]) 10/25/2011  . PSVT (paroxysmal supraventricular tachycardia) ([place]) 03/29/2011  . HIP PAIN 05/03/2010  . TOTAL HIP FOLLOW-UP 05/03/2010  . INTERDIGITAL NEUROMA 03/28/2010  . IMPINGEMENT SYNDROME 03/28/2010  . THYROID NODULE, RIGHT 09/06/2008  . HIP, ARTHRITIS, DEGEN./OSTEO 05/03/2008  . LEG EDEMA, BILATERAL 12/22/2007  . PALPITATIONS, RECURRENT 08/27/2007  . Depression with anxiety 07/31/2007  . WEAKNESS, MUSCLE 05/28/2007  . Other specified disorders of adrenal gland ([place]) 05/14/2007  . SOB (shortness of breath) 05/06/2007  . Fatigue 05/01/2007  . Hyperlipidemia 01/28/2007  . Blood loss anemia 01/28/2007  . Allergic rhinitis 01/28/2007  . GERD (gastroesophageal reflux disease) 01/28/2007  . OVERACTIVE BLADDER 01/28/2007  . Osteoarthritis 01/28/2007  . Pain in lower back 01/28/2007  . Osteoporosis 01/28/2007    Orientation RESPIRATION BLADDER Height & Weight     Self, Place  Normal Incontinent Weight: 126 lb 1.7 oz (57.2 kg) Height:  5' 3"  (160 cm)  BEHAVIORAL SYMPTOMS/MOOD NEUROLOGICAL BOWEL NUTRITION STATUS      Continent Diet(heart healthy)  AMBULATORY STATUS COMMUNICATION OF NEEDS Skin   Extensive Assist Verbally Normal                       Personal Care Assistance Level of Assistance  Bathing, Feeding, Dressing Bathing Assistance: Maximum assistance Feeding assistance: Independent Dressing Assistance: Limited assistance  Functional Limitations Info  Sight, Hearing, Speech Sight Info: Adequate(glaucoma both eyes) Hearing Info: Adequate Speech Info: Adequate    SPECIAL CARE FACTORS FREQUENCY  PT (By licensed PT), OT (By licensed OT)     PT Frequency: 2x OT  Frequency: 2x            Contractures Contractures Info: Not present    Additional Factors Info  Code Status, Allergies Code Status Info: DNR Allergies Info: Augmentin Amoxicillin-pot Clavulanate, Ibuprofen, Naproxen, Statins, Surgilube Gyne-moistrin, Tape           Current Medications (03/05/2019):  This is the current hospital active medication list Current Facility-Administered Medications  Medication Dose Route Frequency Provider Last Rate Last Dose  . acetaminophen (TYLENOL) tablet 650 mg  650 mg Oral Q6H PRN Colbert Ewing, MD   650 mg at 03/04/19 2228  . atenolol (TENORMIN) tablet 25 mg  25 mg Oral Daily Pokhrel, Laxman, MD   25 mg at 03/05/19 1019  . B-complex with vitamin C tablet 1 tablet  1 tablet Oral Daily Park, Derenda Mis, MD   1 tablet at 03/05/19 1021  . dorzolamide-timolol (COSOPT) 22.3-6.8 MG/ML ophthalmic solution 1 drop  1 drop Left Eye BID Colbert Ewing, MD   1 drop at 03/04/19 2233  . DULoxetine (CYMBALTA) DR capsule 30 mg  30 mg Oral Daily Park, Derenda Mis, MD   30 mg at 03/05/19 1020  . feeding supplement (BOOST / RESOURCE BREEZE) liquid 1 Container  237 mL Oral Q lunch Park, Derenda Mis, MD   1 Container at 03/04/19 1208  . ferrous gluconate (FERGON) tablet 324 mg  324 mg Oral Q breakfast Colbert Ewing, MD   324 mg at 03/05/19 1019  . folic acid (FOLVITE) tablet 0.5 mg  0.5 mg Oral Daily Park, Derenda Mis, MD   0.5 mg at 03/05/19 1020  . gabapentin (NEURONTIN) capsule 100 mg  100 mg Oral BID Colbert Ewing, MD   100 mg at 03/05/19 1018  . hydrALAZINE (APRESOLINE) injection 10 mg  10 mg Intravenous Q6H PRN Colbert Ewing, MD   10 mg at 03/04/19 2240  . ipratropium-albuterol (DUONEB) 0.5-2.5 (3) MG/3ML nebulizer solution 3 mL  3 mL Nebulization Q6H PRN Park, Derenda Mis, MD   3 mL at 02/25/19 1047  . latanoprost (XALATAN) 0.005 % ophthalmic solution 1 drop  1 drop Both Eyes QHS Park, Derenda Mis, MD   1 drop at 03/04/19 2236  . losartan (COZAAR) tablet 50 mg  50 mg  Oral Daily Park, Derenda Mis, MD   50 mg at 03/05/19 1020  . magnesium oxide (MAG-OX) tablet 400 mg  400 mg Oral BID Pokhrel, Laxman, MD   400 mg at 03/05/19 1021  . multivitamin (PROSIGHT) tablet 1 tablet  1 tablet Oral Daily Park, Derenda Mis, MD   1 tablet at 03/05/19 1021  . pantoprazole (PROTONIX) EC tablet 40 mg  40 mg Oral Daily Park, Derenda Mis, MD   40 mg at 03/05/19 1019  . polyethylene glycol (MIRALAX / GLYCOLAX) packet 17 g  17 g Oral Daily PRN Colbert Ewing, MD   17 g at 03/04/19 0147  . sodium chloride flush (NS) 0.9 % injection 3 mL  3 mL Intravenous Q12H Park, Derenda Mis, MD   3 mL at 03/05/19 1037  . sodium chloride tablet 1 g  1 g Oral TID WC Pokhrel, Laxman, MD   1 g at 03/05/19 1021     Discharge Medications: Please  see discharge summary for a list of discharge medications.  Relevant Imaging Results:  Relevant Lab Results:   Additional Information [id number]  Lia Hopping, LCSW

## 2019-03-05 NOTE — Discharge Instructions (Signed)
Hyponatremia  Hyponatremia is when the amount of salt (sodium) in your blood is too low. When salt levels are low, your cells absorb extra water and they swell. The swelling happens throughout the body, but it mostly affects the brain. Follow these instructions at home:  Take medicines only as told by your doctor. Many medicines can make this condition worse. Talk with your doctor about any medicines that you are currently taking.  Carefully follow a recommended diet as told by your doctor.  Carefully follow instructions from your doctor about fluid restrictions.  Keep all follow-up visits as told by your doctor. This is important.  Do not drink alcohol. Contact a doctor if:  You feel sicker to your stomach (nauseous).  You feel more confused.  You feel more tired (fatigued).  Your headache gets worse.  You feel weaker.  Your symptoms go away and then they come back.  You have trouble following the diet instructions. Get help right away if:  You start to twitch and shake (have a seizure).  You pass out (faint).  You keep having watery poop (diarrhea).  You keep throwing up (vomiting). This information is not intended to replace advice given to you by your health care provider. Make sure you discuss any questions you have with your health care provider. Document Released: 08/15/2011 Document Revised: 05/10/2016 Document Reviewed: 11/29/2014 Elsevier Interactive Patient Education  2019 [place]. Heart Failure Action Plan A heart failure action plan helps you understand what to do when you have symptoms of heart failure. Follow the plan that was created by you and your health care provider. Review your plan each time you visit your health care provider. Red zone These signs and symptoms mean you should get medical help right away:  You have trouble breathing when resting.  You have a dry cough that is getting worse.  You have swelling or pain in your legs or  abdomen that is getting worse.  You suddenly gain more than 2-3 lb (0.9-1.4 kg) in a day, or more than 5 lb (2.3 kg) in one week. This amount may be more or less depending on your condition.  You have trouble staying awake or you feel confused.  You have chest pain.  You do not have an appetite.  You pass out. If you experience any of these symptoms:  Call your local emergency services (911 in the [place] right away or seek help at the emergency department of the nearest hospital. Yellow zone These signs and symptoms mean your condition may be getting worse and you should make some changes:  You have trouble breathing when you are active or you need to sleep with extra pillows.  You have swelling in your legs or abdomen.  You gain 2-3 lb (0.9-1.4 kg) in one day, or 5 lb (2.3 kg) in one week. This amount may be more or less depending on your condition.  You get tired easily.  You have trouble sleeping.  You have a dry cough. If you experience any of these symptoms:  Contact your health care provider within the next day.  Your health care provider may adjust your medicines. Green zone These signs mean you are doing well and can continue what you are doing:  You do not have shortness of breath.  You have very little swelling or no new swelling.  Your weight is stable (no gain or loss).  You have a normal activity level.  You do not have chest pain or any  other new symptoms. Follow these instructions at home:  Take over-the-counter and prescription medicines only as told by your health care provider.  Weigh yourself daily. Your target weight is __________ lb (__________ kg). ? Call your health care provider if you gain more than __________ lb (__________ kg) in a day, or more than __________ lb (__________ kg) in one week.  Eat a heart-healthy diet. Work with a diet and nutrition specialist (dietitian) to create an eating plan that is best for you.  Keep all follow-up  visits as told by your health care provider. This is important. Where to find more information  American Heart Association: www.heart.org Summary  Follow the action plan that was created by you and your health care provider.  Get help right away if you have any symptoms in the Red zone. This information is not intended to replace advice given to you by your health care provider. Make sure you discuss any questions you have with your health care provider. Document Released: 01/12/2017 Document Revised: 08/07/2017 Document Reviewed: 01/12/2017 Elsevier Interactive Patient Education  2019 [employer].

## 2019-03-05 NOTE — Progress Notes (Signed)
Report called to Olegario Shearer at [facility].  All questions answered.  Son aware of transport.

## 2019-03-05 NOTE — TOC Transition Note (Signed)
Transition of Care [facility]) - CM/SW Discharge Note   Patient Details  Name: ABBEE [name] MRN: [id number] Date of Birth: 1921/10/29  [facility]) CM/SW Contact:  Lia Hopping, [place] Phone Number: 03/05/2019, 3:19 PM   Clinical Narrative:    CSW informed by [name] denied patient again for rehab at [facility]. CSW notified the patient son [name] at bedside. [facility] Education officer, museum notified. [facility] [employer] attorney states can accept the patient with private pay only. CSW notified patient who has requested a break down cost of care for the patient stay. Patient provided this information and agreeable to for the patient to transfer.   Nurse call report to: [phone] EXT. 45 Room 24   Final next level of care: Skilled Nursing Facility Barriers to Discharge: SNF Authorization Denied   Patient Goals and CMS Choice Patient states their goals for this hospitalization and ongoing recovery are:: to be able to transfer again on her own.  CMS Medicare.gov Compare Post Acute Care list provided to:: (Daughter in law) Choice offered to / list presented to : Adult Children  Discharge Placement PASRR number recieved: 02/22/19            Patient [facility] bed [facility]: [facility] Patient to be transferred to facility by: [place] Name of family member notified: Son- [name] Patient and family notified of of transfer: 03/05/19  Discharge Plan and Services                          Social Determinants of Health (SDOH) Interventions     Readmission Risk Interventions No flowsheet data found.

## 2019-03-05 NOTE — Discharge Summary (Signed)
Physician Discharge Summary  [name] IRC:[id number] DOB: 01-15-1921 DOA: 02/21/2019  PCP: Virgie Dad, MD  Admit date: 02/21/2019 Discharge date: 03/05/2019  [name]  Recommendations for Outpatient Follow-up:  1. Follow up with PCP in 1 week 2. Please obtain BMP in one week 3. Consider restarting a low-dose diuretic for underlying diastolic CHF 4. Review electrolytes and next PCP visit; history of chronic hyponatremia on salt tablets  Home Health: No Equipment/Devices: None  Discharge Condition: Stable CODE STATUS: DNR Diet recommendation: Heart healthy  History of present illness:  [name] is a 85 year old female with history of diastolic CHF, hypertension, diet-controlled diabetes mellitus, history of PSVT, history of breast cancer, chronic hypoxic respiratory failure on home O2 was admitted to the hospital on 3/7 with 2 weeks of generalized progressive weakness and increased dyspnea on exertion. She was found to be significantly fluid overloaded and the chest x-ray in the ED showed large left-sided pleural effusion.     Patient underwent thoracocentesis with removal of 700 mL of fluid which was transudative in nature.  She was continued on IV diuretics with improvement in her symptoms.  Subsequently, diuretics were discontinued and gentle IV fluid was given due to mild dehydration from over-diuresis and hyponatremia.  Physical therapy evaluated patient and recommended skilled nursing facility.  At this time, peer-to-peer review has performed by me and  the insurance has requested further evaluation notes to consider for skilled nursing facility placement.  Hospital course:  Acute on chronic hypoxic respiratory failure due to acute on chronic diastolic CHF Patient presenting with progressive shortness of breath and weakness found to have a large left-sided pleural effusion.   2D echocardiogram on 02/22/2019 showed normal ejection fraction with EF of 60 to 65% but increased RV systolic pressure.    BNP elevated to 2.4 K. CT angiogram of the chest was performed which did not show any evidence of pulmonary embolism or pneumonia.  There was evidence of left-sided pleural effusion.  Patient underwent thoracentesis with removal of 700 mL of fluid, that was transudative.  Patient was aggressively diuresed with IV furosemide with net negative of 12 L during hospitalization.  Patient with mild hyponatremia, and her diuretics were held.  Recommend following up with PCP following discharge for further evaluation if low-dose diuretic required in the future.  Recommend daily weights and to maintain a log to bring to next PCP visit.  Mild confusion hallucinations possible hospital induced delirium.  Patient appears to be improving.  Patient is alert awake and communicative.   Hyponatremia Patient with a history of hyponatremia, likely secondary to volume overload; likely complicated by aggressive diuresis while inpatient with slight dehydration.  Received some IV fluids with improvement of her sodium.  Recommend continue sodium chloride tablets 3 times daily with meals.  Recommend repeat BMP in 1 week at PCP visit to evaluate electrolytes.  Mild volume depletion likely secondary to significant diuresis. Improved with mild hydration.  Continue to hold Lasix for now and recommend follow-up with PCP for discussion about restarting.  Mild acute kidney injury  Etiology likely from volume depletion.  Resolved at this time.   --Continue to hold Lasix.    Mild troponin elevation Troponins remained flat, not in a pattern consistent with ACS, no chest pain.  Likely secondary to decompensated CHF.  Klebsiella UTI Completed course of cefdinir.  Hypokalemia Resolved.   Hypertension Atenolol changed to once a day dosing.  Continues on losartan  Deconditioning, debility. Patient  does have potential for improvement.  Patient was much more independent prior to coming to the hospital and due to hospitalization and congestive heart failure she has deconditioned.  Physical therapy and occupational repeat has evaluated the patient and have recommended  skilled nursing facility for improving endurance, strengthening.  Patient will discharge to [facility] for additional therapy following hospitalization.  Discharge Diagnoses:  Active Problems:   * No active hospital problems. *    Discharge Instructions  Discharge Instructions    (HEART FAILURE PATIENTS) Call MD:  Anytime you have any of the following symptoms: 1) 3 pound weight gain in 24 hours or 5 pounds in 1 week 2) shortness of breath, with or without a dry hacking cough 3) swelling in the hands, feet or stomach 4) if you have to sleep on extra pillows at night in order to breathe.   Complete by:  As directed    Call MD for:  difficulty breathing, headache or visual disturbances   Complete by:  As directed    Call MD for:  persistant dizziness or light-headedness   Complete by:  As directed    Call MD for:  persistant nausea and vomiting   Complete by:  As directed    Call MD for:  temperature >100.4   Complete by:  As directed    Diet - low sodium heart healthy   Complete by:  As directed    Increase activity slowly   Complete by:  As directed      Allergies as of 03/05/2019      Reactions   Augmentin [amoxicillin-pot Clavulanate] Other (See Comments)   unknown   Ibuprofen Hives   Naproxen Other (See Comments)   unknown   Statins Other (See Comments)   Feels like knives in stomach   Surgilube [gyne-moistrin]    Rectal itching, burning   Tape Rash      Medication List    TAKE these medications   acetaminophen 500 MG tablet Commonly known as:  TYLENOL Take 500 mg by mouth 2 (two) times daily.   atenolol 25 MG tablet Commonly known as:  TENORMIN Take 1 tablet (25 mg total) by mouth  daily. What changed:  when to take this   B Complex-B12 Tabs Take 1 tablet by mouth daily.   Biofreeze 4 % Gel Generic drug:  Menthol (Topical Analgesic) Apply 1 application topically every 8 (eight) hours. Apply to both knees   dorzolamide-timolol 22.3-6.8 MG/ML ophthalmic solution Commonly known as:  COSOPT Place 1 drop into the left eye 2 (two) times daily.   DULoxetine 30 MG capsule Commonly known as:  CYMBALTA Take 30 mg by mouth daily.   ferrous gluconate 240 (27 FE) MG tablet Commonly known as:  FERGON Take 240 mg by mouth daily.   folic acid 657 MCG tablet Commonly known as:  FOLVITE Take 400 mcg by mouth daily.   gabapentin 100 MG capsule Commonly known as:  NEURONTIN Take 100 mg by mouth 2 (two) times daily.   ipratropium-albuterol 0.5-2.5 (3) MG/3ML Soln Commonly known as:  DUONEB Take 3 mLs by nebulization 2 (two) times daily as needed (sob and wheezing). chronic respiratory failure   latanoprost 0.005 % ophthalmic solution Commonly known as:  XALATAN Place 1 drop into both eyes at bedtime.   lidocaine 5 % Commonly known as:  LIDODERM Place 1 patch onto the skin daily as needed. Remove & Discard patch within 12 hours  or as directed by MD   losartan 50 MG tablet Commonly known as:  COZAAR Take 1 tablet (50 mg total) by mouth daily. Start taking on:  March 06, 2019   magnesium oxide 400 (241.3 Mg) MG tablet Commonly known as:  MAG-OX Take 1 tablet (400 mg total) by mouth 2 (two) times daily.   omeprazole 20 MG capsule Commonly known as:  PRILOSEC TAKE 1 CAPSULE EVERY DAY.   polyethylene glycol packet Commonly known as:  MIRALAX / GLYCOLAX Take 17 g by mouth daily.   potassium chloride SA 20 MEQ tablet Commonly known as:  K-DUR,KLOR-CON Take 40 mEq by mouth daily.   PRESERVISION AREDS 2 PO Take 1 capsule by mouth 2 (two) times daily.   PreviDent 5000 Booster Plus 1.1 % Pste Generic drug:  Sodium Fluoride Place onto teeth at bedtime.    Simethicone 80 MG Tabs Take 1 tablet by mouth 4 (four) times daily as needed (indigestion).   sodium chloride 1 g tablet Take 1 tablet (1 g total) by mouth 3 (three) times daily with meals for 30 days. What changed:  when to take this      Follow-up Information    Virgie Dad, MD. Schedule an appointment as soon as possible for a visit in 1 week(s).   Specialty:  Internal Medicine Contact information: [place] [address] [phone]          Allergies  Allergen Reactions  . Augmentin [Amoxicillin-Pot Clavulanate] Other (See Comments)    unknown  . Ibuprofen Hives  . Naproxen Other (See Comments)    unknown  . Statins Other (See Comments)    Feels like knives in stomach  . Surgilube [Gyne-Moistrin]     Rectal itching, burning  . Tape Rash    Consultations:  none   Procedures/Studies: Dg Chest 1 View  Result Date: 02/23/2019 CLINICAL DATA:  Status post thoracentesis. EXAM: CHEST  1 VIEW COMPARISON:  Radiographs of February 23, 2019. FINDINGS: Stable cardiomegaly. Left pleural effusion is significantly smaller status post thoracentesis. No pneumothorax is noted. Right lung is unremarkable. Bony thorax is unremarkable. IMPRESSION: Left pleural effusion is significantly smaller status post thoracentesis. No pneumothorax is noted. Electronically Signed   By: Marijo Conception, M.D.   On: 02/23/2019 16:28   Dg Chest 1 View  Result Date: 02/21/2019 CLINICAL DATA:  Weakness, shortness of Breath EXAM: CHEST  1 VIEW COMPARISON:  08/11/2018 FINDINGS: Large left pleural effusion, slightly increased since prior study. Cardiomegaly with vascular congestion. Left base atelectasis. Interstitial prominence throughout the lungs could reflect interstitial edema. IMPRESSION: Cardiomegaly with vascular congestion and probable mild interstitial edema. Large left pleural effusion with left base atelectasis. Electronically Signed   By: Rolm Baptise M.D.   On: 02/21/2019 17:06    Dg Chest 2 View  Result Date: 02/23/2019 CLINICAL DATA:  Shortness of breath.  Confusion. EXAM: CHEST - 2 VIEW COMPARISON:  February 21, 2019 FINDINGS: There is extensive airspace consolidation throughout the left lower lobe with left pleural effusion. The right lung is clear. There is cardiomegaly with pulmonary vascularity normal. No adenopathy. There is aortic atherosclerosis. No evident bone lesions. 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 IMPRESSION: Extensive consolidation throughout the left lower lobe, presumed pneumonia, with left pleural effusion. Right lung clear. Stable cardiomegaly. Aortic Atherosclerosis (ICD10-I70.0). Electronically Signed   By: Lowella Grip III M.D.   On: 02/23/2019 12:59   Ct Head Wo Contrast  Result Date: 02/21/2019  CLINICAL DATA:  Weakness.  Hypokalemia.  Elevated blood pressure. EXAM: CT HEAD WITHOUT CONTRAST TECHNIQUE: Contiguous axial images were obtained from the base of the skull through the vertex without intravenous contrast. COMPARISON:  August 09, 2017 FINDINGS: Brain: No subdural, epidural, or subarachnoid hemorrhage identified. Cerebellum, brainstem, and basal cisterns are normal. Ventricles and sulci are unchanged and unremarkable. Moderate to severe white matter changes are similar in the interval. No acute cortical ischemia or infarct. No mass effect or midline shift. Vascular: No hyperdense vessel or unexpected calcification. Skull: Normal. Negative for fracture or focal lesion. Sinuses/Orbits: No acute finding. Other: None. IMPRESSION: 1. Chronic white matter changes. No acute intracranial abnormalities noted. Electronically Signed   By: Dorise Bullion III M.D   On: 02/21/2019 17:07   Ct Angio Chest Pe W Or Wo Contrast  Result Date: 02/24/2019 CLINICAL DATA:  86 year old female with acute shortness of breath and elevated D-dimer. History of CHF and breast cancer. EXAM: CT ANGIOGRAPHY CHEST WITH CONTRAST TECHNIQUE:  Multidetector CT imaging of the chest was performed using the standard protocol during bolus administration of intravenous contrast. Multiplanar CT image reconstructions and MIPs were obtained to evaluate the vascular anatomy. CONTRAST:  181m OMNIPAQUE IOHEXOL 350 MG/ML SOLN COMPARISON:  02/24/2019 and prior chest radiographs. 02/03/2015 chest CT FINDINGS: Cardiovascular: This is a technically satisfactory study. No pulmonary emboli are identified. Cardiomegaly noted. Aortic atherosclerotic calcifications noted without aneurysm. No pericardial effusion. Mediastinum/Nodes: No enlarged mediastinal, hilar, or axillary lymph nodes. Thyroid gland, trachea, and esophagus demonstrate no significant findings. Lungs/Pleura: A new moderate to large LEFT pleural effusion is noted with moderate LEFT LOWER lung atelectasis. Mild tree-in-bud opacities within the RIGHT LOWER lobe (series 6: Images 76-86 noted compatible with mild infection. Other scattered peripheral pulmonary opacities do not appear significantly changed from the prior study likely representing areas of scarring. Mild central ground-glass opacities are present and may represent minimal interstitial edema. No pneumothorax. Upper Abdomen: No acute abnormality Musculoskeletal: No acute or suspicious bony abnormalities. Review of the MIP images confirms the above findings. IMPRESSION: 1. No evidence of pulmonary emboli. 2. New moderate to large LEFT pleural effusion with LEFT LOWER lung atelectasis. 3. Mild central ground-glass opacities which may represent mild interstitial edema. 4. Mild tree-in-bud opacities within the RIGHT LOWER lobe likely representing infection. 5. Cardiomegaly and Aortic Atherosclerosis (ICD10-I70.0). Electronically Signed   By: JMargarette CanadaM.D.   On: 02/24/2019 18:05   Dg Chest Port 1 View  Result Date: 02/25/2019 CLINICAL DATA:  CHF EXAM: PORTABLE CHEST 1 VIEW COMPARISON:  02/24/2019 FINDINGS: Cardiac shadow is stable. Aortic  calcifications are noted. Left pleural effusion is again noted similar to that seen on the prior exam. Underlying atelectatic changes are present. The right lung remains clear. IMPRESSION: Stable appearance of left pleural effusion and basilar atelectasis. Electronically Signed   By: MInez CatalinaM.D.   On: 02/25/2019 16:08   Dg Chest Port 1 View  Result Date: 02/24/2019 CLINICAL DATA:  Dyspnea. EXAM: PORTABLE CHEST 1 VIEW COMPARISON:  Radiograph of February 23, 2019. FINDINGS: Stable cardiomegaly. Atherosclerosis of thoracic aorta is noted. No pneumothorax is noted. Mild left basilar atelectasis or infiltrate is noted with small left pleural effusion. Right lung is clear. Bony thorax is unremarkable. IMPRESSION: Mild left basilar atelectasis or infiltrate is noted with small left pleural effusion. Aortic Atherosclerosis (ICD10-I70.0). Electronically Signed   By: JMarijo Conception M.D.   On: 02/24/2019 11:49   UKoreaThoracentesis Asp Pleural Space W/img Guide  Result Date:  02/23/2019 INDICATION: Patient with history of CHF, remote breast cancer, dyspnea, left pleural effusion. Request made for diagnostic and therapeutic left thoracentesis. EXAM: ULTRASOUND GUIDED DIAGNOSTIC AND THERAPEUTIC LEFT THORACENTESIS MEDICATIONS: None COMPLICATIONS: None immediate. PROCEDURE: An ultrasound guided thoracentesis was thoroughly discussed with the patient and questions answered. The benefits, risks, alternatives and complications were also discussed. The patient understands and wishes to proceed with the procedure. Written consent was obtained. Ultrasound was performed to localize and mark an adequate pocket of fluid in the left chest. The area was then prepped and draped in the normal sterile fashion. 1% Lidocaine was used for local anesthesia. Under ultrasound guidance a 6 Fr Safe-T-Centesis catheter was introduced. Thoracentesis was performed. The catheter was removed and a dressing applied. FINDINGS: A total of approximately  770 cc of yellow fluid was removed. Samples were sent to the laboratory as requested by the clinical team. Due to patient coughing only the above amount of fluid was removed today. IMPRESSION: Successful ultrasound guided diagnostic and therapeutic left thoracentesis yielding 770 cc of pleural fluid. Read by: Rowe Robert, PA-C Electronically Signed   By: Jacqulynn Cadet M.D.   On: 02/23/2019 15:58    Transthoracic echocardiogram 02/22/2019: IMPRESSIONS  1. The left ventricle has normal systolic function with an ejection fraction of 60-65%. The cavity size was normal. There is mildly increased left ventricular wall thickness. Left ventricular diastolic Doppler parameters are consistent with  pseudonormalization. There is right ventricular volume and pressure overload. No evidence of left ventricular regional wall motion abnormalities.  2. The right ventricle has mildly reduced systolic function. The cavity was mildly enlarged. There is no increase in right ventricular wall thickness. Right ventricular systolic pressure is severely elevated with an estimated pressure of 74.6 mmHg.  3. Left atrial size was moderately dilated.  4. Right atrial size was mildly dilated.  5. The mitral valve is normal in structure. There is mild mitral annular calcification present.  6. The tricuspid valve is normal in structure. Tricuspid valve regurgitation is mild-moderate.  7. The aortic valve is tricuspid. Aortic valve regurgitation is trivial by color flow Doppler.  8. The aortic root is normal in size and structure.  Subjective: Patient seen and examined at bedside, resting comfortably bedside chair.  Son present.  No concerns overnight.  Ready for discharge, plan discharge to [facility] for further rehabilitation.  Denies headache, no shortness of breath, no chest pain, no palpitations, no abdominal pain, no issues with bowel/bladder function.  No acute events overnight per nursing staff.   Discharge  Exam: Vitals:   03/05/19 0942 03/05/19 1406  BP:  (!) 135/56  Pulse: 64 (!) 56  Resp:  18  Temp:  97.9 F (36.6 C)  SpO2:  96%   Vitals:   03/05/19 0500 03/05/19 0536 03/05/19 0942 03/05/19 1406  BP:  (!) 149/66  (!) 135/56  Pulse:  (!) 59 64 (!) 56  Resp:  16  18  Temp:  97.8 F (36.6 C)  97.9 F (36.6 C)  TempSrc:  Oral  Oral  SpO2:  95%  96%  Weight: 57.2 kg     Height:        General: Pt is alert, awake, not in acute distress Cardiovascular: RRR, S1/S2 +, no rubs, no gallops Respiratory: CTA bilaterally, no wheezing, no rhonchi Abdominal: Soft, NT, ND, bowel sounds + Extremities: no edema, no cyanosis    The results of significant diagnostics from this hospitalization (including imaging, microbiology, ancillary and laboratory) are listed  below for reference.     Microbiology: Recent Results (from the past 240 hour(s))  Culture, body fluid-bottle     Status: None   Collection Time: 02/23/19  4:31 PM  Result Value Ref Range Status   Specimen Description FLUID PLEURAL LEFT  Final   Special Requests BOTTLES DRAWN AEROBIC AND ANAEROBIC  Final   Culture   Final    NO GROWTH 5 DAYS Performed at [facility], [place] [address], [place], [place] [address]    Report Status 02/28/2019 FINAL  Final  Gram stain     Status: None   Collection Time: 02/23/19  4:31 PM  Result Value Ref Range Status   Specimen Description FLUID PLEURAL LEFT  Final   Special Requests NONE  Final   Gram Stain   Final    WBC PRESENT,BOTH PMN AND MONONUCLEAR NO ORGANISMS SEEN CYTOSPIN SMEAR Performed at [facility], [place] [address] [place],  [address]    Report Status 02/24/2019 FINAL  Final     Labs: BNP (last 3 results) Recent Labs    02/21/19 1721  BNP 1,025.8*   Basic Metabolic Panel: Recent Labs  Lab 02/27/19 0345 02/28/19 0356 03/01/19 0430 03/02/19 0433 03/03/19 0408 03/04/19 0325 03/05/19 0359  NA 128* 127* 128* 129* 130* 130* 129*  K 4.8 4.5 4.2  4.6 4.1 4.1 4.4  CL 85* 87* 89* 92* 93* 92* 95*  CO2 33* 29 29 29 30 29 25   GLUCOSE 91 85 90 89 137* 102* 84  BUN 29* 30* 29* 27* 24* 20 21  CREATININE 1.07* 0.94 0.95 0.84 0.87 0.86 0.81  CALCIUM 9.1 8.7* 8.6* 8.8* 9.0 9.2 8.9  MG 1.6* 1.9  --   --   --  1.9 1.9   Liver Function Tests: No results for input(s): AST, ALT, ALKPHOS, BILITOT, PROT, ALBUMIN in the last 168 hours. No results for input(s): LIPASE, AMYLASE in the last 168 hours. No results for input(s): AMMONIA in the last 168 hours. CBC: Recent Labs  Lab 02/27/19 0345 02/28/19 0356 03/01/19 0430 03/02/19 0433 03/04/19 0325  WBC 9.9 6.8 7.3 7.1 6.9  HGB 12.9 13.0 11.5* 11.6* 12.2  HCT 41.0 41.3 35.3* 35.9* 39.1  MCV 101.7* 102.5* 100.0 101.1* 102.1*  PLT 218 210 244 251 252   Cardiac Enzymes: No results for input(s): CKTOTAL, CKMB, CKMBINDEX, TROPONINI in the last 168 hours. BNP: Invalid input(s): POCBNP CBG: No results for input(s): GLUCAP in the last 168 hours. D-Dimer No results for input(s): DDIMER in the last 72 hours. Hgb A1c No results for input(s): HGBA1C in the last 72 hours. Lipid Profile No results for input(s): CHOL, HDL, LDLCALC, TRIG, CHOLHDL, LDLDIRECT in the last 72 hours. Thyroid function studies No results for input(s): TSH, T4TOTAL, T3FREE, THYROIDAB in the last 72 hours.  Invalid input(s): FREET3 Anemia work up No results for input(s): VITAMINB12, FOLATE, FERRITIN, TIBC, IRON, RETICCTPCT in the last 72 hours. Urinalysis    Component Value Date/Time   COLORURINE YELLOW 02/21/2019 1606   APPEARANCEUR CLEAR 02/21/2019 1606   LABSPEC 1.013 02/21/2019 1606   PHURINE 6.0 02/21/2019 1606   GLUCOSEU NEGATIVE 02/21/2019 1606   HGBUR NEGATIVE 02/21/2019 1606   BILIRUBINUR NEGATIVE 02/21/2019 1606   KETONESUR NEGATIVE 02/21/2019 1606   PROTEINUR 100 (A) 02/21/2019 1606   NITRITE NEGATIVE 02/21/2019 1606   LEUKOCYTESUR SMALL (A) 02/21/2019 1606   Sepsis Labs Invalid input(s):  PROCALCITONIN,  WBC,  LACTICIDVEN Microbiology Recent Results (from the past 240  hour(s))  Culture, body fluid-bottle     Status: None   Collection Time: 02/23/19  4:31 PM  Result Value Ref Range Status   Specimen Description FLUID PLEURAL LEFT  Final   Special Requests BOTTLES DRAWN AEROBIC AND ANAEROBIC  Final   Culture   Final    NO GROWTH 5 DAYS Performed at [facility], [place] [address] [place], [place] [address]    Report Status 02/28/2019 FINAL  Final  Gram stain     Status: None   Collection Time: 02/23/19  4:31 PM  Result Value Ref Range Status   Specimen Description FLUID PLEURAL LEFT  Final   Special Requests NONE  Final   Gram Stain   Final    WBC PRESENT,BOTH PMN AND MONONUCLEAR NO ORGANISMS SEEN CYTOSPIN SMEAR Performed at [facility], [place] [address] [place], [place] [address]    Report Status 02/24/2019 FINAL  Final     Time coordinating discharge: Over 30 minutes  SIGNED:   Gottfried Standish J [place] DO  [facility] 03/05/2019, 2:50 PM

## 2019-03-06 ENCOUNTER — Encounter: Payer: Self-pay | Admitting: Internal Medicine

## 2019-03-06 ENCOUNTER — Non-Acute Institutional Stay (SKILLED_NURSING_FACILITY): Payer: Medicare HMO | Admitting: Internal Medicine

## 2019-03-06 DIAGNOSIS — I5032 Chronic diastolic (congestive) heart failure: Secondary | ICD-10-CM | POA: Diagnosis not present

## 2019-03-06 DIAGNOSIS — R531 Weakness: Secondary | ICD-10-CM | POA: Diagnosis not present

## 2019-03-06 DIAGNOSIS — E871 Hypo-osmolality and hyponatremia: Secondary | ICD-10-CM | POA: Diagnosis not present

## 2019-03-06 DIAGNOSIS — E611 Iron deficiency: Secondary | ICD-10-CM | POA: Diagnosis not present

## 2019-03-06 DIAGNOSIS — I10 Essential (primary) hypertension: Secondary | ICD-10-CM

## 2019-03-06 DIAGNOSIS — R69 Illness, unspecified: Secondary | ICD-10-CM | POA: Diagnosis not present

## 2019-03-06 DIAGNOSIS — F418 Other specified anxiety disorders: Secondary | ICD-10-CM

## 2019-03-06 NOTE — Progress Notes (Signed)
A user error has taken place.

## 2019-03-06 NOTE — Progress Notes (Signed)
Provider: Veleta Miners L,MD   Location:  [facility] Room Number: 24 Place of Service:  SNF ([id number])  PCP: Virgie Dad, MD Patient Care Team: Virgie Dad, MD as PCP - General (Internal Medicine) Lannette Donath Glo Herring., MD as Referring Physician (Internal Medicine) Satira Sark, MD as Consulting Physician (Cardiology) Kyung Rudd, MD as Consulting Physician (Radiation Oncology) Mast, Man X, NP as Nurse Practitioner (Internal Medicine)  Extended Emergency Contact Information Primary Emergency Contact: Neita Garnet of [employer] Phone: [phone] Relation: Son Secondary Emergency Contact: Jester,Lance Address: [address]          [place], [place] [address] [name] of [place] Phone: [phone] Mobile Phone: [phone] Relation: Son  Code Status: DNR Goals of Care: Advanced Directive information Advanced Directives 03/06/2019  Does Patient Have a Medical Advance Directive? Yes  Type of Advance Directive Out of facility DNR (pink MOST or yellow form)  Does patient want to make changes to medical advance directive? No - Patient declined  Copy of [place] in Chart? -  Would patient like information on creating a medical advance directive? -  Pre-existing out of facility DNR order (yellow form or pink MOST form) Pink MOST form placed in chart (order not valid for inpatient use)      Chief Complaint  Patient presents with   New Admit To SNF    new admit to facility     HPI: Patient is a 85 y.o. female seen today for admission to SNF for therpy. She stayed in the hospital from 03/07-03/19 for Left Pleural Effusion and Hyponatremia and Weakness Patient has a history of diastolic CHF, hyponatremia, hypertension, diabetes mellitus, PSVT and history of breast cancer and lung cancer.Iron Def Anemia She lives in the [place] and friends at [place]. She was sent to the emergency room as she was complaining of  progressively getting short of breath and weak. She was found to have a large left-sided pleural effusion on the chest x-ray.  She underwent thoracocentesis with removal of 700 mL of fluid which was transudative in nature.  She was treated with IV diuresis. Echo showed a EF of 60 to 65%.  CT scan of the chest was negative for PE or pneumonia Her other issue is hyponatremia which got little worse with Lasix.  She is now on sodium tablets 3 times a day instead of 1. Patient seems to be back to her baseline.  She denies any cough or shortness of breath or chest pain. She continues to be weak.  In [place] she was independent in her transfers though was mostly wheelchair dependent.  But now she is 2 people mod assist even for transfers.   Past Medical History:  Diagnosis Date   Allergic rhinitis    Anemia    NOS   Anxiety    Biceps tendon rupture    Bilateral   Breast cancer ([facility]) 2001   Left s/p lumpectomy and XRT    Bronchiectasis ([facility]) 2014   Noted on chest x-ray   Complication of anesthesia    slow to wake up   Cough 2014   Depression    Diverticulosis    Elevated liver enzymes    Biopsy consistent with steatohepatitis   Fundic gland polyps of stomach, benign    GERD (gastroesophageal reflux disease) 06/2007   EGD Dr Gala Romney >sm [place], multiple fundic gland polyps   Glaucoma 2013   both eyes   Hemorrhoids  Hiatal hernia    Hx of radiation therapy 07/17/10 to 07/21/10   SRS LLL lung   Hyperlipidemia    Insomnia    Low back pain    scoliosis   Lung cancer ([facility]) 05/2010   Left 2011 - rad x 3   Lymphedema    Left arm   Osteoarthritis    Osteopenia 10/25/2016   Osteoporosis, senile    Overactive bladder    Pneumonia    RLL with sepsis   PSVT (paroxysmal supraventricular tachycardia) (HCC)    Rheumatic fever    Age 18   Right thyroid nodule    Steatohepatitis    liver bx   Type 2 diabetes mellitus ([facility])    Past Surgical History:  Procedure  Laterality Date   ABDOMINAL HYSTERECTOMY  1964   APPENDECTOMY  1964   BIOPSY THYROID  11/2008   BREAST BIOPSY     X 3 w/ cystectomy   BREAST LUMPECTOMY  2001   CATARACT EXTRACTION Bilateral 2003   Lens implant Dr. Charise Killian   CHOLECYSTECTOMY  1965   COLONOSCOPY  09/11/2011   Procedure: COLONOSCOPY;  Surgeon: Dorothyann Peng, MD;  [facility];  Service: Endoscopy;  Laterality: N/A;   COLONOSCOPY  2005 /2012   Dr. Lucianne Muss- diverticulosis, ext hemorrhoids.   COLONOSCOPY WITH PROPOFOL N/A 11/09/2017   Procedure: COLONOSCOPY WITH PROPOFOL;  Surgeon: Mauri Pole, MD;  Location: [facility];  Service: Endoscopy;  Laterality: N/A;   COLONOSCOPY WITH PROPOFOL N/A 11/12/2017   Procedure: COLONOSCOPY WITH PROPOFOL;  Surgeon: Ladene Artist, MD;  [facility];  Service: Endoscopy;  Laterality: N/A;   CYSTOSCOPY  2007   ESOPHAGOGASTRODUODENOSCOPY  09/11/2011   Procedure: ESOPHAGOGASTRODUODENOSCOPY (EGD);  Surgeon: Dorothyann Peng, MD;  [facility];  Service: Endoscopy;  Laterality: N/A;   ESOPHAGOGASTRODUODENOSCOPY (EGD) WITH PROPOFOL N/A 11/08/2017   Procedure: ESOPHAGOGASTRODUODENOSCOPY (EGD) WITH PROPOFOL;  Surgeon: Mauri Pole, MD;  [facility];  Service: Endoscopy;  Laterality: N/A;   LIVER BIOPSY  2008   LUNG BIOPSY Left 06/06/2010   REVISION TOTAL HIP ARTHROPLASTY  2007   Left   SKIN CANCER EXCISION     nose and left lower cheek   TONSILLECTOMY  1930   TOTAL HIP ARTHROPLASTY Bilateral 2005 & 2007    Dr. Earl Lagos. Aline Brochure     reports that she has never smoked. She has never used smokeless tobacco. She reports that she does not drink alcohol or use drugs. Social History   Socioeconomic History   Marital status: Widowed    Spouse name: Not on file   Number of children: 2   Years of education: college    Highest education level: Not on file  Occupational History   Occupation: retired Tax Customer service manager: RETIRED  Social Designer, fashion/clothing strain: Not hard at all   Food insecurity:    Worry: Never true    Inability: Never true   Transportation needs:    Medical: No    Non-medical: No  Tobacco Use   Smoking status: Never Smoker   Smokeless tobacco: Never Used   Tobacco comment: 2nd hand-husband  Substance and Sexual Activity   Alcohol use: No   Drug use: No   Sexual activity: Not Currently  Lifestyle   Physical activity:    Days per week: 7 days    Minutes per session: 30 min   Stress: Not at all  Relationships   Social  connections:    Talks on phone: More than three times a week    Gets together: More than three times a week    Attends religious service: Never    Active member of club or organization: No    Attends meetings of clubs or organizations: Never    Relationship status: Widowed   Intimate partner violence:    Fear of current or ex partner: No    Emotionally abused: No    Physically abused: No    Forced sexual activity: No  Other Topics Concern   Not on file  Social History Narrative   Lives at [place] [facility] [place] 02/2013   2 adopted children   Was in [place], [place]   Widowed 2013   Walks with walker   Exercise: none    POA - Living Will    Functional Status Survey:    Family History  Problem Relation Age of Onset   Heart failure Mother    Osteoporosis Mother    Hypertension Father    Stroke Father    Heart failure Father    Leukemia Brother    Heart failure Brother    Cancer Maternal Grandmother    Stroke Maternal Grandfather    Cancer Other     Health Maintenance  Topic Date Due   FOOT EXAM  07/19/1931   HEMOGLOBIN A1C  10/17/2017   TETANUS/TDAP  10/13/2029 (Originally 12/18/2015)   OPHTHALMOLOGY EXAM  12/27/2019   INFLUENZA VACCINE  Completed   DEXA SCAN  Completed   PNA vac Low Risk Adult  Completed    Allergies  Allergen Reactions   Augmentin [Amoxicillin-Pot Clavulanate] Other (See  Comments)    unknown   Ibuprofen Hives   Naproxen Other (See Comments)    unknown   Statins Other (See Comments)    Feels like knives in stomach   Surgilube [Gyne-Moistrin]     Rectal itching, burning   Tape Rash    Outpatient Encounter Medications as of 03/06/2019  Medication Sig   acetaminophen (TYLENOL) 500 MG tablet Take 500 mg by mouth 2 (two) times daily.    atenolol (TENORMIN) 25 MG tablet Take 1 tablet (25 mg total) by mouth daily.   B Complex Vitamins (B COMPLEX-B12) TABS Take 1 tablet by mouth daily.   DULoxetine (CYMBALTA) 30 MG capsule Take 1 capsule (30 mg total) by mouth daily.   ferrous gluconate (FERGON) 240 (27 FE) MG tablet Take 1 tablet (240 mg total) by mouth daily.   folic acid (FOLVITE) 017 MCG tablet Take 1 tablet (400 mcg total) by mouth daily.   gabapentin (NEURONTIN) 100 MG capsule Take 1 capsule (100 mg total) by mouth 2 (two) times daily.   ipratropium-albuterol (DUONEB) 0.5-2.5 (3) MG/3ML SOLN Take 3 mLs by nebulization 2 (two) times daily as needed (sob and wheezing). chronic respiratory failure   latanoprost (XALATAN) 0.005 % ophthalmic solution Place 1 drop into both eyes at bedtime.   lidocaine (LIDODERM) 5 % Place 1 patch onto the skin daily as needed. Remove & Discard patch within 12 hours or as directed by MD   losartan (COZAAR) 50 MG tablet Take 1 tablet (50 mg total) by mouth daily.   magnesium oxide (MAG-OX) 400 (241.3 Mg) MG tablet Take 1 tablet (400 mg total) by mouth 2 (two) times daily.   Menthol, Topical Analgesic, (BIOFREEZE) 4 % GEL Apply 1 application topically every 8 (eight) hours as needed. Apply to both knees   Multiple Vitamins-Minerals (PRESERVISION AREDS 2)  CAPS 2 capsules BID   omeprazole (PRILOSEC) 20 MG capsule Take 1 capsule (20 mg total) by mouth daily.   polyethylene glycol (MIRALAX / GLYCOLAX) packet Take 17 g by mouth daily as needed.   potassium chloride SA (K-DUR,KLOR-CON) 20 MEQ tablet Take 2 tablets  (40 mEq total) by mouth daily.   Simethicone 80 MG TABS Take 1 tablet (80 mg total) by mouth 4 (four) times daily as needed for up to 30 days (indigestion).   sodium chloride 1 g tablet Take 1 tablet (1 g total) by mouth 3 (three) times daily with meals for 30 days.   Sodium Fluoride (PREVIDENT 5000 BOOSTER PLUS) 1.1 % PSTE Apply to teeth nightly   [DISCONTINUED] dorzolamide-timolol (COSOPT) 22.3-6.8 MG/ML ophthalmic solution Place 1 drop into the left eye 2 (two) times daily.   No facility-administered encounter medications on file as of 03/06/2019.     Review of Systems  Constitutional: Positive for activity change and appetite change.  HENT: Negative.   Respiratory: Positive for cough.   Cardiovascular: Negative.   Gastrointestinal: Negative.   Genitourinary: Negative.   Musculoskeletal: Positive for back pain.  Skin: Negative.   Neurological: Positive for weakness.  Psychiatric/Behavioral: Negative.     Vitals:   03/06/19 1027  BP: 140/60  Pulse: 64  Resp: 18  Temp: 98.5 F (36.9 C)  SpO2: 99%  Weight: 126 lb (57.2 kg)  Height: 5' 4"  (1.626 m)   Body mass index is 21.63 kg/m. Physical Exam Vitals signs reviewed.  Constitutional:      Appearance: Normal appearance.  HENT:     Head: Normocephalic.     Nose: Nose normal.     Mouth/Throat:     Mouth: Mucous membranes are moist.     Pharynx: Oropharynx is clear.  Eyes:     Pupils: Pupils are equal, round, and reactive to light.  Neck:     Musculoskeletal: Neck supple.  Cardiovascular:     Rate and Rhythm: Normal rate and regular rhythm.     Pulses: Normal pulses.     Heart sounds: Normal heart sounds.  Pulmonary:     Effort: Pulmonary effort is normal. No respiratory distress.     Breath sounds: Normal breath sounds. No wheezing or rales.  Abdominal:     General: Abdomen is flat. Bowel sounds are normal.     Palpations: Abdomen is soft.  Musculoskeletal:     Comments: Mild swelling Bilateral  Skin:     General: Skin is warm and dry.  Neurological:     General: No focal deficit present.     Mental Status: She is alert and oriented to person, place, and time.     Comments: Has 3-4 /5 in LE  Psychiatric:        Mood and Affect: Mood normal.        Thought Content: Thought content normal.        Judgment: Judgment normal.     Labs reviewed: Basic Metabolic Panel: Recent Labs    02/21/19 1721  02/28/19 0356  03/03/19 0408 03/04/19 0325 03/05/19 0359  NA 132*   < > 127*   < > 130* 130* 129*  K 3.3*   < > 4.5   < > 4.1 4.1 4.4  CL 90*   < > 87*   < > 93* 92* 95*  CO2 30   < > 29   < > 30 29 25   GLUCOSE 98   < > 85   < >  137* 102* 84  BUN 18   < > 30*   < > 24* 20 21  CREATININE 0.78   < > 0.94   < > 0.87 0.86 0.81  CALCIUM 8.9   < > 8.7*   < > 9.0 9.2 8.9  MG 1.5*   < > 1.9  --   --  1.9 1.9  PHOS 3.0  --   --   --   --   --   --    < > = values in this interval not displayed.   Liver Function Tests: Recent Labs    08/21/18 08/25/18 09/25/18 02/21/19 1721  AST 45 32 18 54*  ALT 47 29 11 39  ALKPHOS 134 122 139* 161*  BILITOT 0.6 0.4  --  1.7*  PROT 6.2 6.1  --  8.4*  ALBUMIN 2.7 2.6  --  3.8   Recent Labs    02/21/19 1721  LIPASE 39   Recent Labs    08/10/18 0053  AMMONIA 25   CBC: Recent Labs    08/09/18 1519  02/21/19 1721  03/01/19 0430 03/02/19 0433 03/04/19 0325  WBC 7.1   < > 7.5   < > 7.3 7.1 6.9  NEUTROABS 5.8  --  6.0  --   --   --   --   HGB 12.2   < > 12.9   < > 11.5* 11.6* 12.2  HCT 36.5   < > 40.0   < > 35.3* 35.9* 39.1  MCV 92.9   < > 99.8   < > 100.0 101.1* 102.1*  PLT 297   < > 175   < > 244 251 252   < > = values in this interval not displayed.   Cardiac Enzymes: Recent Labs    02/21/19 1721 02/22/19 0450 02/22/19 1646  TROPONINI 0.05* 0.10* 0.12*   BNP: Invalid input(s): POCBNP Lab Results  Component Value Date   HGBA1C 5.3 04/16/2017   Lab Results  Component Value Date   TSH 1.964 02/21/2019   Lab Results    Component Value Date   VITAMINB12 546 09/10/2011   Lab Results  Component Value Date   FOLATE >20.0 09/10/2011   Lab Results  Component Value Date   IRON 36 (L) 12/24/2017   TIBC 248 (L) 09/10/2011   FERRITIN 251 12/24/2017    Imaging and Procedures obtained prior to SNF admission: Dg Chest 1 View  Result Date: 02/21/2019 CLINICAL DATA:  Weakness, shortness of Breath EXAM: CHEST  1 VIEW COMPARISON:  08/11/2018 FINDINGS: Large left pleural effusion, slightly increased since prior study. Cardiomegaly with vascular congestion. Left base atelectasis. Interstitial prominence throughout the lungs could reflect interstitial edema. IMPRESSION: Cardiomegaly with vascular congestion and probable mild interstitial edema. Large left pleural effusion with left base atelectasis. Electronically Signed   By: Rolm Baptise M.D.   On: 02/21/2019 17:06   Ct Head Wo Contrast  Result Date: 02/21/2019 CLINICAL DATA:  Weakness.  Hypokalemia.  Elevated blood pressure. EXAM: CT HEAD WITHOUT CONTRAST TECHNIQUE: Contiguous axial images were obtained from the base of the skull through the vertex without intravenous contrast. COMPARISON:  August 09, 2017 FINDINGS: Brain: No subdural, epidural, or subarachnoid hemorrhage identified. Cerebellum, brainstem, and basal cisterns are normal. Ventricles and sulci are unchanged and unremarkable. Moderate to severe white matter changes are similar in the interval. No acute cortical ischemia or infarct. No mass effect or midline shift. Vascular: No hyperdense vessel or unexpected calcification. Skull: Normal.  Negative for fracture or focal lesion. Sinuses/Orbits: No acute finding. Other: None. IMPRESSION: 1. Chronic white matter changes. No acute intracranial abnormalities noted. Electronically Signed   By: Dorise Bullion III M.D   On: 02/21/2019 17:07    Assessment/Plan Chronic diastolic CHF (congestive heart failure) with Pleural Effusion D/w the patient she does not want to  restart her lasix as she is worried about Hyponatremia Will watch her Weight closely and start her on Low dose of lasix Right now will keep her off lasix Her Echo in the hospital showed EF of 60 % Her weight went down from 148 lbs to 126 lbs today  Hyponatremia Will continue to monitor  Off her lasix Repeat BMP  Generalized weakness Patient will start therapy She is trying to get independent on her transfers so she can go back to her AL unit  Essential hypertension Controlled on Cozaar  PSVT On Tenormin Depression with anxiety Continue on Cymbalta  Iron deficiency Anemia is resolved Stable on Iron      Family/ staff Communication:   Labs/tests ordered: CBC ,CMP, in 1 week  Total time spent in this patient care encounter was 45_ minutes; greater than 50% of the visit spent counseling patient, reviewing records , Labs and coordinating care for problems addressed at this encounter.

## 2019-03-12 DIAGNOSIS — D509 Iron deficiency anemia, unspecified: Secondary | ICD-10-CM | POA: Diagnosis not present

## 2019-03-12 DIAGNOSIS — J9611 Chronic respiratory failure with hypoxia: Secondary | ICD-10-CM | POA: Diagnosis not present

## 2019-03-12 DIAGNOSIS — D649 Anemia, unspecified: Secondary | ICD-10-CM | POA: Diagnosis not present

## 2019-03-12 LAB — BASIC METABOLIC PANEL
BUN: 20 (ref 4–21)
Creatinine: 0.8 (ref 0.5–1.1)
Glucose: 77
Potassium: 5.3 (ref 3.4–5.3)
SODIUM: 131 — AB (ref 137–147)

## 2019-03-12 LAB — CBC AND DIFFERENTIAL
HCT: 35 — AB (ref 36–46)
Hemoglobin: 11.7 — AB (ref 12.0–16.0)
Platelets: 287 (ref 150–399)
WBC: 5.2

## 2019-03-12 LAB — HEPATIC FUNCTION PANEL
ALT: 27 (ref 7–35)
AST: 30 (ref 13–35)
Alkaline Phosphatase: 113 (ref 25–125)
Bilirubin, Total: 0.6

## 2019-03-17 ENCOUNTER — Non-Acute Institutional Stay (SKILLED_NURSING_FACILITY): Payer: Medicare HMO | Admitting: Family

## 2019-03-17 ENCOUNTER — Encounter: Payer: Self-pay | Admitting: Family

## 2019-03-17 DIAGNOSIS — R2681 Unsteadiness on feet: Secondary | ICD-10-CM | POA: Diagnosis not present

## 2019-03-17 DIAGNOSIS — E871 Hypo-osmolality and hyponatremia: Secondary | ICD-10-CM | POA: Diagnosis not present

## 2019-03-17 DIAGNOSIS — R69 Illness, unspecified: Secondary | ICD-10-CM | POA: Diagnosis not present

## 2019-03-17 DIAGNOSIS — K219 Gastro-esophageal reflux disease without esophagitis: Secondary | ICD-10-CM

## 2019-03-17 DIAGNOSIS — I5032 Chronic diastolic (congestive) heart failure: Secondary | ICD-10-CM | POA: Diagnosis not present

## 2019-03-17 DIAGNOSIS — R531 Weakness: Secondary | ICD-10-CM | POA: Diagnosis not present

## 2019-03-17 DIAGNOSIS — I480 Paroxysmal atrial fibrillation: Secondary | ICD-10-CM | POA: Diagnosis not present

## 2019-03-17 DIAGNOSIS — E611 Iron deficiency: Secondary | ICD-10-CM | POA: Diagnosis not present

## 2019-03-17 DIAGNOSIS — G629 Polyneuropathy, unspecified: Secondary | ICD-10-CM

## 2019-03-17 DIAGNOSIS — E46 Unspecified protein-calorie malnutrition: Secondary | ICD-10-CM | POA: Diagnosis not present

## 2019-03-17 DIAGNOSIS — F418 Other specified anxiety disorders: Secondary | ICD-10-CM

## 2019-03-17 NOTE — Progress Notes (Signed)
Location:  [facility] Room Number: 24A Place of Service:  SNF [phone])  Provider: Marlowe Sax FNP-C   PCP: Virgie Dad, MD Patient Care Team: Virgie Dad, MD as PCP - General (Internal Medicine) Lanelle Bal., MD as Referring Physician (Internal Medicine) Satira Sark, MD as Consulting Physician (Cardiology) Kyung Rudd, MD as Consulting Physician (Radiation Oncology) Mast, Man X, NP as Nurse Practitioner (Internal Medicine)  Extended Emergency Contact Information Primary Emergency Contact: Neita Garnet of [place] Phone: [phone] Relation: Son Secondary Emergency Contact: Declercq,Lance Address: [address]          [place], [place] [address] [name] of [place] Phone: [phone] Mobile Phone: [phone] Relation: Son  Code Status: DNR Goals of care:  Advanced Directive information Advanced Directives 03/17/2019  Does Patient Have a Medical Advance Directive? Yes  Type of Advance Directive Out of facility DNR (pink MOST or yellow form)  Does patient want to make changes to medical advance directive? No - Patient declined  Copy of [place] in Chart? -  Would patient like information on creating a medical advance directive? -  Pre-existing out of facility DNR order (yellow form or pink MOST form) Pink MOST form placed in chart (order not valid for inpatient use)     Allergies  Allergen Reactions   Augmentin [Amoxicillin-Pot Clavulanate] Other (See Comments)    unknown   Ibuprofen Hives   Naproxen Other (See Comments)    unknown   Statins Other (See Comments)    Feels like knives in stomach   Surgilube [Gyne-Moistrin]     Rectal itching, burning   Tape Rash    Chief Complaint  Patient presents with   Discharge Note    Discharging from SNF  to [facility] living Facility     HPI:  85 y.o. female seen today at [facility] for discharge back to [facility] facility. She was here for short term rehabilitation for post hospital admission from 02/21/2019 -03/05/2019 for progressive generalized weakness and increased dyspnea on exertion.Her chest X-ray showed large amounts of left pleural effusion.she underwent thoracocentesis on 02/23/2019 with removal of 700 ml of fluid which was transudative in nature.she was also treated with IV diuretics with much improvement of symptoms.she also had hyponatremia and her sodium chloride 1 Gm Tablet was increased to three times daily.she was seen by Physical Therapy who recommended discharge to skilled Nursing facility for rehabilitation.she has a medical history of Hypertension,chronic diastolic congestive Heart Failure,Hx breast cancer,diet controlled type 2 diabetes mellitus,Afib,chronic respiratory failure with hypoxia was on oxygen prior to hospiital admission,Depression,Generalized anxiety among other other condition.  she is seen in her room today sitting on wheelchair.she denies any acute issues during visit.She has had unremarkable stay here in Rehab.Her recent Na+ was 131 (03/12/2019).Potassium was 5.3 and her potassium chloride was reduce to 20 meq tablet daily with a repeat BMP 03/19/2019.will discontinue potassium tablet if level remain stable given currently off diuretic. No recent fall episode.her weight has remained stable on protein supplements.      She has worked well with PT/OT now stable for discharge back to [facility] ALF.she continues to require a stand by assist with transfer from wheelchair to recliner and verse visa.she also requires assistance with set up for her ADL's.She will be discharged with  PT/OT to continue with ROM, Exercise, Gait stability and muscle strengthening.She does not require any DME.states has own wheelchair.  Past  Medical History:  Diagnosis Date   Allergic rhinitis    Anemia    NOS   Anxiety    Biceps tendon rupture    Bilateral   Breast cancer ([place]) 2001     Left s/p lumpectomy and XRT    Bronchiectasis ([place]) 2014   Noted on chest x-ray   Complication of anesthesia    slow to wake up   Cough 2014   Depression    Diverticulosis    Elevated liver enzymes    Biopsy consistent with steatohepatitis   Fundic gland polyps of stomach, benign    GERD (gastroesophageal reflux disease) 06/2007   EGD Dr Gala Romney >sm Trainer, multiple fundic gland polyps   Glaucoma 2013   both eyes   Hemorrhoids    Hiatal hernia    Hx of radiation therapy 07/17/10 to 07/21/10   SRS LLL lung   Hyperlipidemia    Insomnia    Low back pain    scoliosis   Lung cancer ([place]) 05/2010   Left 2011 - rad x 3   Lymphedema    Left arm   Osteoarthritis    Osteopenia 10/25/2016   Osteoporosis, senile    Overactive bladder    Pneumonia    RLL with sepsis   PSVT (paroxysmal supraventricular tachycardia) (HCC)    Rheumatic fever    Age 76   Right thyroid nodule    Steatohepatitis    liver bx   Type 2 diabetes mellitus ([place])     Past Surgical History:  Procedure Laterality Date   [place]   BIOPSY THYROID  11/2008   BREAST BIOPSY     X 3 w/ cystectomy   BREAST LUMPECTOMY  2001   CATARACT EXTRACTION Bilateral 2003   Lens implant Dr. Charise Killian   CHOLECYSTECTOMY  1965   COLONOSCOPY  09/11/2011   Procedure: COLONOSCOPY;  Surgeon: Dorothyann Peng, MD;  Location[facility];  Service: Endoscopy;  Laterality: N/A;   COLONOSCOPY  2005 /2012   Dr. Lucianne Muss- diverticulosis, ext hemorrhoids.   COLONOSCOPY WITH PROPOFOL N/A 11/09/2017   Procedure: COLONOSCOPY WITH PROPOFOL;  Surgeon: Mauri Pole, MD;  Location: [facility] ENDOS[facility];  Service: Endoscopy;  Laterality: N/A;   COLONOSCOPY WITH PROPOFOL N/A 11/12/2017   Procedure: COLONOSCOPY WITH PROPOFOL;  Surgeon: Ladene Artist, MD;  Location: [facility];  Service: Endoscopy;  Laterality: N/A;   CYSTOSCOPY  2007   ESOPHAGOGASTRODUODENOSCOPY  09/11/2011    Procedure: ESOPHAGOGASTRODUODENOSCOPY (EGD);  Surgeon: Dorothyann Peng, MD;  [facility];  Service: Endoscopy;  Laterality: N/A;   ESOPHAGOGASTRODUODENOSCOPY (EGD) WITH PROPOFOL N/A 11/08/2017   Procedure: ESOPHAGOGASTRODUODENOSCOPY (EGD) WITH PROPOFOL;  Surgeon: Mauri Pole, MD;  Location[facility];  Service: Endoscopy;  Laterality: N/A;   LIVER BIOPSY  2008   LUNG BIOPSY Left 06/06/2010   REVISION TOTAL HIP ARTHROPLASTY  2007   Left   SKIN CANCER EXCISION     nose and left lower cheek   TONSILLECTOMY  1930   TOTAL HIP ARTHROPLASTY Bilateral 2005 & 2007    Dr. Earl Lagos. Aline Brochure       reports that she has never smoked. She has never used smokeless tobacco. She reports that she does not drink alcohol or use drugs. Social History   Socioeconomic History   Marital status: Widowed    Spouse name: Not on file   Number of children: 2   Years of education: college    Highest  education level: Not on file  Occupational History   Occupation: retired Tax Comptroller: RETIRED  Social Designer, fashion/clothing strain: Not hard at all   Food insecurity:    Worry: Never true    Inability: Never true   Transportation needs:    Medical: No    Non-medical: No  Tobacco Use   Smoking status: Never Smoker   Smokeless tobacco: Never Used   Tobacco comment: 2nd hand-husband  Substance and Sexual Activity   Alcohol use: No   Drug use: No   Sexual activity: Not Currently  Lifestyle   Physical activity:    Days per week: 7 days    Minutes per session: 30 min   Stress: Not at all  Relationships   Social connections:    Talks on phone: More than three times a week    Gets together: More than three times a week    Attends religious service: Never    Active member of club or organization: No    Attends meetings of clubs or organizations: Never    Relationship status: Widowed   Intimate partner violence:    Fear of current or ex  partner: No    Emotionally abused: No    Physically abused: No    Forced sexual activity: No  Other Topics Concern   Not on file  Social History Narrative   Lives at [facility] 02/2013   2 adopted children   Was in [place], [place]   Widowed 2013   Walks with walker   Exercise: none    POA - Living Will    Allergies  Allergen Reactions   Augmentin [Amoxicillin-Pot Clavulanate] Other (See Comments)    unknown   Ibuprofen Hives   Naproxen Other (See Comments)    unknown   Statins Other (See Comments)    Feels like knives in stomach   Surgilube [Gyne-Moistrin]     Rectal itching, burning   Tape Rash    Pertinent  Health Maintenance Due  Topic Date Due   FOOT EXAM  07/19/1931   HEMOGLOBIN A1C  10/17/2017   OPHTHALMOLOGY EXAM  12/27/2019   INFLUENZA VACCINE  Completed   DEXA SCAN  Completed   PNA vac Low Risk Adult  Completed    Medications: Outpatient Encounter Medications as of 03/17/2019  Medication Sig   acetaminophen (TYLENOL) 500 MG tablet Take 500 mg by mouth 2 (two) times daily.    atenolol (TENORMIN) 25 MG tablet Take 1 tablet (25 mg total) by mouth daily.   B Complex Vitamins (B COMPLEX-B12) TABS Take 1 tablet by mouth daily.   DULoxetine (CYMBALTA) 30 MG capsule Take 1 capsule (30 mg total) by mouth daily.   ferrous gluconate (FERGON) 240 (27 FE) MG tablet Take 1 tablet (240 mg total) by mouth daily.   folic acid (FOLVITE) 517 MCG tablet Take 1 tablet (400 mcg total) by mouth daily.   gabapentin (NEURONTIN) 100 MG capsule Take 1 capsule (100 mg total) by mouth 2 (two) times daily.   ipratropium-albuterol (DUONEB) 0.5-2.5 (3) MG/3ML SOLN Take 3 mLs by nebulization 2 (two) times daily as needed (sob and wheezing). chronic respiratory failure   latanoprost (XALATAN) 0.005 % ophthalmic solution Place 1 drop into both eyes at bedtime.   lidocaine (LIDODERM) 5 % Place 1 patch onto the skin daily as needed. Remove & Discard patch within 12  hours or as directed by MD   losartan (COZAAR) 50 MG  tablet Take 1 tablet (50 mg total) by mouth daily.   magnesium oxide (MAG-OX) 400 (241.3 Mg) MG tablet Take 1 tablet (400 mg total) by mouth 2 (two) times daily.   Menthol, Topical Analgesic, (BIOFREEZE) 4 % GEL Apply 1 application topically every 8 (eight) hours as needed. Apply to both knees   Multiple Vitamins-Minerals (PRESERVISION AREDS 2) CAPS Take 1 capsule by mouth 2 (two) times daily.   omeprazole (PRILOSEC) 20 MG capsule Take 1 capsule (20 mg total) by mouth daily.   polyethylene glycol (MIRALAX / GLYCOLAX) packet Take 17 g by mouth daily as needed.   potassium chloride SA (K-DUR,KLOR-CON) 20 MEQ tablet Take 20 mEq by mouth daily.   Simethicone 80 MG TABS Take 1 tablet (80 mg total) by mouth 4 (four) times daily as needed for up to 30 days (indigestion).   sodium chloride 1 g tablet Take 1 tablet (1 g total) by mouth 3 (three) times daily with meals for 30 days.   Sodium Fluoride (PREVIDENT 5000 BOOSTER PLUS) 1.1 % PSTE Apply to teeth nightly   [DISCONTINUED] Multiple Vitamins-Minerals (PRESERVISION AREDS 2) CAPS 2 capsules BID   [DISCONTINUED] potassium chloride SA (K-DUR,KLOR-CON) 20 MEQ tablet Take 2 tablets (40 mEq total) by mouth daily.   No facility-administered encounter medications on file as of 03/17/2019.      Review of Systems  Constitutional: Negative for appetite change, chills, fatigue, fever and unexpected weight change.  HENT: Positive for hearing loss. Negative for congestion, rhinorrhea, sinus pressure, sinus pain, sneezing, sore throat and trouble swallowing.   Eyes: Positive for visual disturbance. Negative for pain, discharge, redness and itching.       Wears eye glasses   Respiratory: Negative for cough, chest tightness, shortness of breath and wheezing.   Cardiovascular: Negative for chest pain, palpitations and leg swelling.  Gastrointestinal: Negative for abdominal distention, abdominal pain,  constipation, diarrhea, nausea and vomiting.  Endocrine: Negative for cold intolerance, heat intolerance, polydipsia, polyphagia and polyuria.  Genitourinary: Negative for difficulty urinating, dysuria, flank pain, frequency and urgency.  Musculoskeletal: Positive for gait problem.  Skin: Negative for color change, pallor, rash and wound.  Neurological: Negative for dizziness, light-headedness and headaches.  Psychiatric/Behavioral: Negative for agitation, confusion and sleep disturbance. The patient is not nervous/anxious.     Vitals:   03/17/19 1030  BP: (!) 113/55  Pulse: 63  Resp: 20  Temp: (!) 97.1 F (36.2 C)  TempSrc: Oral  SpO2: 98%  Weight: 123 lb 1.6 oz (55.8 kg)  Height: 5' 4"  (1.626 m)   Body mass index is 21.13 kg/m. Physical Exam  Constitutional: She is oriented to person, place, and time and well-developed, well-nourished, and in no distress. No distress.  HENT:  Head: Normocephalic.  Right Ear: External ear normal.  Left Ear: External ear normal.  Nose: Nose normal.  Mouth/Throat: Oropharynx is clear and moist. No oropharyngeal exudate.  Bilateral ear hearing aids   Eyes: Pupils are equal, round, and reactive to light. Conjunctivae and EOM are normal. Right eye exhibits no discharge. Left eye exhibits no discharge. No scleral icterus.  Corrective lens in place   Neck: Normal range of motion. No JVD present. No thyromegaly present.  Cardiovascular: Normal rate, regular rhythm, normal heart sounds and intact distal pulses. Exam reveals no gallop and no friction rub.  No murmur heard. Pulmonary/Chest: Effort normal and breath sounds normal. No respiratory distress. She has no wheezes. She has no rales.  Abdominal: Soft. Bowel sounds are normal. She exhibits  no distension. There is no abdominal tenderness. There is no rebound and no guarding.  Musculoskeletal:        General: No tenderness or edema.     Comments: Moves x 4 extremities bilateral lower extremities  decreased strength 4/5.unsteady gait self propels on wheelchair.  Lymphadenopathy:    She has no cervical adenopathy.  Neurological: She is oriented to person, place, and time. No cranial nerve deficit. Gait normal. Coordination normal.  Skin: Skin is warm and dry. No rash noted. No erythema. No pallor.  Psychiatric: Mood, memory, affect and judgment normal.   Labs reviewed: Basic Metabolic Panel: Recent Labs    02/21/19 1721  02/28/19 0356  03/03/19 0408 03/04/19 0325 03/05/19 0359 03/12/19  NA 132*   < > 127*   < > 130* 130* 129* 131*  K 3.3*   < > 4.5   < > 4.1 4.1 4.4 5.3  CL 90*   < > 87*   < > 93* 92* 95*  --   CO2 30   < > 29   < > 30 29 25   --   GLUCOSE 98   < > 85   < > 137* 102* 84  --   BUN 18   < > 30*   < > 24* 20 21 20   CREATININE 0.78   < > 0.94   < > 0.87 0.86 0.81 0.8  CALCIUM 8.9   < > 8.7*   < > 9.0 9.2 8.9  --   MG 1.5*   < > 1.9  --   --  1.9 1.9  --   PHOS 3.0  --   --   --   --   --   --   --    < > = values in this interval not displayed.   Liver Function Tests: Recent Labs    08/21/18 08/25/18 09/25/18 02/21/19 1721 03/12/19  AST 45 32 18 54* 30  ALT 47 29 11 39 27  ALKPHOS 134 122 139* 161* 113  BILITOT 0.6 0.4  --  1.7*  --   PROT 6.2 6.1  --  8.4*  --   ALBUMIN 2.7 2.6  --  3.8  --    Recent Labs    02/21/19 1721  LIPASE 39   Recent Labs    08/10/18 0053  AMMONIA 25   CBC: Recent Labs    08/09/18 1519  02/21/19 1721  03/01/19 0430 03/02/19 0433 03/04/19 0325 03/12/19  WBC 7.1   < > 7.5   < > 7.3 7.1 6.9 5.2  NEUTROABS 5.8  --  6.0  --   --   --   --   --   HGB 12.2   < > 12.9   < > 11.5* 11.6* 12.2 11.7*  HCT 36.5   < > 40.0   < > 35.3* 35.9* 39.1 35*  MCV 92.9   < > 99.8   < > 100.0 101.1* 102.1*  --   PLT 297   < > 175   < > 244 251 252 287   < > = values in this interval not displayed.   Cardiac Enzymes: Recent Labs    02/21/19 1721 02/22/19 0450 02/22/19 1646  TROPONINI 0.05* 0.10* 0.12*    Procedures and  Imaging Studies During Stay: Dg Chest 1 View  Result Date: 02/23/2019 CLINICAL DATA:  Status post thoracentesis. EXAM: CHEST  1 VIEW COMPARISON:  Radiographs of February 23, 2019.  FINDINGS: Stable cardiomegaly. Left pleural effusion is significantly smaller status post thoracentesis. No pneumothorax is noted. Right lung is unremarkable. Bony thorax is unremarkable. IMPRESSION: Left pleural effusion is significantly smaller status post thoracentesis. No pneumothorax is noted. Electronically Signed   By: Marijo Conception, M.D.   On: 02/23/2019 16:28   Dg Chest 1 View  Result Date: 02/21/2019 CLINICAL DATA:  Weakness, shortness of Breath EXAM: CHEST  1 VIEW COMPARISON:  08/11/2018 FINDINGS: Large left pleural effusion, slightly increased since prior study. Cardiomegaly with vascular congestion. Left base atelectasis. Interstitial prominence throughout the lungs could reflect interstitial edema. IMPRESSION: Cardiomegaly with vascular congestion and probable mild interstitial edema. Large left pleural effusion with left base atelectasis. Electronically Signed   By: Rolm Baptise M.D.   On: 02/21/2019 17:06   Dg Chest 2 View  Result Date: 02/23/2019 CLINICAL DATA:  Shortness of breath.  Confusion. EXAM: CHEST - 2 VIEW COMPARISON:  February 21, 2019 FINDINGS: There is extensive airspace consolidation throughout the left lower lobe with left pleural effusion. The right lung is clear. There is cardiomegaly with pulmonary vascularity normal. No adenopathy. There is aortic atherosclerosis. No evident bone lesions. 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 IMPRESSION: Extensive consolidation throughout the left lower lobe, presumed pneumonia, with left pleural effusion. Right lung clear. Stable cardiomegaly. Aortic Atherosclerosis (ICD10-I70.0). Electronically Signed   By: Lowella Grip III M.D.   On: 02/23/2019 12:59   Ct Head Wo Contrast  Result Date: 02/21/2019 CLINICAL DATA:  Weakness.   Hypokalemia.  Elevated blood pressure. EXAM: CT HEAD WITHOUT CONTRAST TECHNIQUE: Contiguous axial images were obtained from the base of the skull through the vertex without intravenous contrast. COMPARISON:  August 09, 2017 FINDINGS: Brain: No subdural, epidural, or subarachnoid hemorrhage identified. Cerebellum, brainstem, and basal cisterns are normal. Ventricles and sulci are unchanged and unremarkable. Moderate to severe white matter changes are similar in the interval. No acute cortical ischemia or infarct. No mass effect or midline shift. Vascular: No hyperdense vessel or unexpected calcification. Skull: Normal. Negative for fracture or focal lesion. Sinuses/Orbits: No acute finding. Other: None. IMPRESSION: 1. Chronic white matter changes. No acute intracranial abnormalities noted. Electronically Signed   By: Dorise Bullion III M.D   On: 02/21/2019 17:07   Ct Angio Chest Pe W Or Wo Contrast  Result Date: 02/24/2019 CLINICAL DATA:  85 year old female with acute shortness of breath and elevated D-dimer. History of CHF and breast cancer. EXAM: CT ANGIOGRAPHY CHEST WITH CONTRAST TECHNIQUE: Multidetector CT imaging of the chest was performed using the standard protocol during bolus administration of intravenous contrast. Multiplanar CT image reconstructions and MIPs were obtained to evaluate the vascular anatomy. CONTRAST:  141m OMNIPAQUE IOHEXOL 350 MG/ML SOLN COMPARISON:  02/24/2019 and prior chest radiographs. 02/03/2015 chest CT FINDINGS: Cardiovascular: This is a technically satisfactory study. No pulmonary emboli are identified. Cardiomegaly noted. Aortic atherosclerotic calcifications noted without aneurysm. No pericardial effusion. Mediastinum/Nodes: No enlarged mediastinal, hilar, or axillary lymph nodes. Thyroid gland, trachea, and esophagus demonstrate no significant findings. Lungs/Pleura: A new moderate to large LEFT pleural effusion is noted with moderate LEFT LOWER lung atelectasis. Mild  tree-in-bud opacities within the RIGHT LOWER lobe (series 6: Images 76-86 noted compatible with mild infection. Other scattered peripheral pulmonary opacities do not appear significantly changed from the prior study likely representing areas of scarring. Mild central ground-glass opacities are present and may represent minimal interstitial edema. No pneumothorax. Upper Abdomen: No acute abnormality Musculoskeletal: No acute or suspicious bony  abnormalities. Review of the MIP images confirms the above findings. IMPRESSION: 1. No evidence of pulmonary emboli. 2. New moderate to large LEFT pleural effusion with LEFT LOWER lung atelectasis. 3. Mild central ground-glass opacities which may represent mild interstitial edema. 4. Mild tree-in-bud opacities within the RIGHT LOWER lobe likely representing infection. 5. Cardiomegaly and Aortic Atherosclerosis (ICD10-I70.0). Electronically Signed   By: Margarette Canada M.D.   On: 02/24/2019 18:05   Dg Chest Port 1 View  Result Date: 02/25/2019 CLINICAL DATA:  CHF EXAM: PORTABLE CHEST 1 VIEW COMPARISON:  02/24/2019 FINDINGS: Cardiac shadow is stable. Aortic calcifications are noted. Left pleural effusion is again noted similar to that seen on the prior exam. Underlying atelectatic changes are present. The right lung remains clear. IMPRESSION: Stable appearance of left pleural effusion and basilar atelectasis. Electronically Signed   By: Inez Catalina M.D.   On: 02/25/2019 16:08   Dg Chest Port 1 View  Result Date: 02/24/2019 CLINICAL DATA:  Dyspnea. EXAM: PORTABLE CHEST 1 VIEW COMPARISON:  Radiograph of February 23, 2019. FINDINGS: Stable cardiomegaly. Atherosclerosis of thoracic aorta is noted. No pneumothorax is noted. Mild left basilar atelectasis or infiltrate is noted with small left pleural effusion. Right lung is clear. Bony thorax is unremarkable. IMPRESSION: Mild left basilar atelectasis or infiltrate is noted with small left pleural effusion. Aortic Atherosclerosis  (ICD10-I70.0). Electronically Signed   By: Marijo Conception, M.D.   On: 02/24/2019 11:49   US Thoracentesis Asp Pleural Space W/img Guide  Result Date: 02/23/2019 INDICATION: Patient with history of CHF, remote breast cancer, dyspnea, left pleural effusion. Request made for diagnostic and therapeutic left thoracentesis. EXAM: ULTRASOUND GUIDED DIAGNOSTIC AND THERAPEUTIC LEFT THORACENTESIS MEDICATIONS: None COMPLICATIONS: None immediate. PROCEDURE: An ultrasound guided thoracentesis was thoroughly discussed with the patient and questions answered. The benefits, risks, alternatives and complications were also discussed. The patient understands and wishes to proceed with the procedure. Written consent was obtained. Ultrasound was performed to localize and mark an adequate pocket of fluid in the left chest. The area was then prepped and draped in the normal sterile fashion. 1% Lidocaine was used for local anesthesia. Under ultrasound guidance a 6 Fr Safe-T-Centesis catheter was introduced. Thoracentesis was performed. The catheter was removed and a dressing applied. FINDINGS: A total of approximately 770 cc of yellow fluid was removed. Samples were sent to the laboratory as requested by the clinical team. Due to patient coughing only the above amount of fluid was removed today. IMPRESSION: Successful ultrasound guided diagnostic and therapeutic left thoracentesis yielding 770 cc of pleural fluid. Read by: Rowe Robert, PA-C Electronically Signed   By: Jacqulynn Cadet M.D.   On: 02/23/2019 15:58   Assessment/Plan:    1. Generalized weakness Has improved with therapy.Will discharge to [facility]F with PT/OT to continue with ROM, Exercise, Gait stability and muscle strengthening.  2. Chronic diastolic CHF (congestive heart failure) (HCC) - No signs of fluid overload. - latest Echo 60 -65 %  - currently off diuretic since discharge from hospital.continue to monitor for signs of fluid overload then  restart diuretic if needed. - check weight daily   3. Hyponatremia -  recent Na+ was 131 (03/12/2019).Has an order for a repeat BMP 03/19/2019. - continue on Sodium Chloride 1 Gm tablet three times daily. - Monitor BMP monthly.   4. Unsteady gait  - self propels on wheelchair and has power wheelchair for long distance. - Discharge with PT/OT for gait stability and muscle strengthening.  - continue  Fall and safety precautions.   5. Paroxysmal atrial fibrillation (HCC) - HR controlled. - continue on Atenolol 25 mg tablet daily.  6. Depression with anxiety - Mood stable. - continue on Duloxetine 30 mg capsule daily.  - continue to monitor for mood changes.  7. Gastroesophageal reflux disease without esophagitis - Asymptomatic. - continue on Omeprazole 20 mg capsule daily.  - Monitor Mg level and vit B12 level periodically.   8. Iron deficiency - latest Hgb 11.7,HCT 35 ( 03/12/2019).  - continue on ferrous gluconate 240 mg tablet daily  - Recheck CBC in 1-2 weeks with PCP   9. Neuropathy - continue on gabapentin 100 mg capsule twice daily.  10. Essential Hypertension  B/p reviewed stable.continue on losartan 50 mg tablet daily and Atenolol 25 mg tablet daily.   11.Protein Malnutrition  - weight stable in the 123 -124 lbs range during rehab. - continue to monitor daily weight. - continue on Boost one can twice daily  - Monitor CMP   Patient is being discharged with the following home health services:   -PT/OT for ROM, exercise, gait stability and muscle strengthening  Patient is being discharged with the following durable medical equipment:   - No DME required has own wheelchair and power wheelchair.   Patient has been advised to f/u with their PCP in 1-2 weeks to for a transitions of care visit.Social services at their facility was responsible for arranging this appointment.Pt will be discharged with adequate prescriptions medications from facility to reach the scheduled  appointment.For controlled substances, a limited supply was provided as appropriate for the individual patient. If the pt normally receives these medications from a pain clinic or has a contract with another physician, these medications should be received from that clinic or physician only).    Future labs/tests needed:  CBC, BMP in 1-2 weeks and Monthly BMP with PCP

## 2019-03-18 DIAGNOSIS — D0220 Carcinoma in situ of unspecified bronchus and lung: Secondary | ICD-10-CM | POA: Diagnosis not present

## 2019-03-18 DIAGNOSIS — Z85118 Personal history of other malignant neoplasm of bronchus and lung: Secondary | ICD-10-CM | POA: Diagnosis not present

## 2019-03-18 DIAGNOSIS — M6281 Muscle weakness (generalized): Secondary | ICD-10-CM | POA: Diagnosis not present

## 2019-03-18 DIAGNOSIS — D509 Iron deficiency anemia, unspecified: Secondary | ICD-10-CM | POA: Diagnosis not present

## 2019-03-18 DIAGNOSIS — E871 Hypo-osmolality and hyponatremia: Secondary | ICD-10-CM | POA: Diagnosis not present

## 2019-03-18 DIAGNOSIS — J9601 Acute respiratory failure with hypoxia: Secondary | ICD-10-CM | POA: Diagnosis not present

## 2019-03-18 DIAGNOSIS — Z853 Personal history of malignant neoplasm of breast: Secondary | ICD-10-CM | POA: Diagnosis not present

## 2019-03-18 DIAGNOSIS — J9620 Acute and chronic respiratory failure, unspecified whether with hypoxia or hypercapnia: Secondary | ICD-10-CM | POA: Diagnosis not present

## 2019-03-18 DIAGNOSIS — R2689 Other abnormalities of gait and mobility: Secondary | ICD-10-CM | POA: Diagnosis not present

## 2019-03-18 DIAGNOSIS — J9611 Chronic respiratory failure with hypoxia: Secondary | ICD-10-CM | POA: Diagnosis not present

## 2019-03-19 DIAGNOSIS — J9611 Chronic respiratory failure with hypoxia: Secondary | ICD-10-CM | POA: Diagnosis not present

## 2019-03-19 DIAGNOSIS — D0220 Carcinoma in situ of unspecified bronchus and lung: Secondary | ICD-10-CM | POA: Diagnosis not present

## 2019-03-19 DIAGNOSIS — Z853 Personal history of malignant neoplasm of breast: Secondary | ICD-10-CM | POA: Diagnosis not present

## 2019-03-19 DIAGNOSIS — R2689 Other abnormalities of gait and mobility: Secondary | ICD-10-CM | POA: Diagnosis not present

## 2019-03-19 DIAGNOSIS — D509 Iron deficiency anemia, unspecified: Secondary | ICD-10-CM | POA: Diagnosis not present

## 2019-03-19 DIAGNOSIS — E871 Hypo-osmolality and hyponatremia: Secondary | ICD-10-CM | POA: Diagnosis not present

## 2019-03-19 DIAGNOSIS — M6281 Muscle weakness (generalized): Secondary | ICD-10-CM | POA: Diagnosis not present

## 2019-03-19 DIAGNOSIS — Z85118 Personal history of other malignant neoplasm of bronchus and lung: Secondary | ICD-10-CM | POA: Diagnosis not present

## 2019-03-19 DIAGNOSIS — J9601 Acute respiratory failure with hypoxia: Secondary | ICD-10-CM | POA: Diagnosis not present

## 2019-03-19 DIAGNOSIS — G9340 Encephalopathy, unspecified: Secondary | ICD-10-CM | POA: Diagnosis not present

## 2019-03-19 DIAGNOSIS — J9620 Acute and chronic respiratory failure, unspecified whether with hypoxia or hypercapnia: Secondary | ICD-10-CM | POA: Diagnosis not present

## 2019-03-20 DIAGNOSIS — Z85118 Personal history of other malignant neoplasm of bronchus and lung: Secondary | ICD-10-CM | POA: Diagnosis not present

## 2019-03-20 DIAGNOSIS — D0220 Carcinoma in situ of unspecified bronchus and lung: Secondary | ICD-10-CM | POA: Diagnosis not present

## 2019-03-20 DIAGNOSIS — R2689 Other abnormalities of gait and mobility: Secondary | ICD-10-CM | POA: Diagnosis not present

## 2019-03-20 DIAGNOSIS — J9611 Chronic respiratory failure with hypoxia: Secondary | ICD-10-CM | POA: Diagnosis not present

## 2019-03-20 DIAGNOSIS — D509 Iron deficiency anemia, unspecified: Secondary | ICD-10-CM | POA: Diagnosis not present

## 2019-03-20 DIAGNOSIS — J9601 Acute respiratory failure with hypoxia: Secondary | ICD-10-CM | POA: Diagnosis not present

## 2019-03-20 DIAGNOSIS — J9620 Acute and chronic respiratory failure, unspecified whether with hypoxia or hypercapnia: Secondary | ICD-10-CM | POA: Diagnosis not present

## 2019-03-20 DIAGNOSIS — Z853 Personal history of malignant neoplasm of breast: Secondary | ICD-10-CM | POA: Diagnosis not present

## 2019-03-20 DIAGNOSIS — M6281 Muscle weakness (generalized): Secondary | ICD-10-CM | POA: Diagnosis not present

## 2019-03-20 DIAGNOSIS — E871 Hypo-osmolality and hyponatremia: Secondary | ICD-10-CM | POA: Diagnosis not present

## 2019-03-23 DIAGNOSIS — D509 Iron deficiency anemia, unspecified: Secondary | ICD-10-CM | POA: Diagnosis not present

## 2019-03-23 DIAGNOSIS — J9620 Acute and chronic respiratory failure, unspecified whether with hypoxia or hypercapnia: Secondary | ICD-10-CM | POA: Diagnosis not present

## 2019-03-23 DIAGNOSIS — E871 Hypo-osmolality and hyponatremia: Secondary | ICD-10-CM | POA: Diagnosis not present

## 2019-03-23 DIAGNOSIS — R2689 Other abnormalities of gait and mobility: Secondary | ICD-10-CM | POA: Diagnosis not present

## 2019-03-23 DIAGNOSIS — Z853 Personal history of malignant neoplasm of breast: Secondary | ICD-10-CM | POA: Diagnosis not present

## 2019-03-23 DIAGNOSIS — I482 Chronic atrial fibrillation, unspecified: Secondary | ICD-10-CM | POA: Diagnosis not present

## 2019-03-23 DIAGNOSIS — Z85118 Personal history of other malignant neoplasm of bronchus and lung: Secondary | ICD-10-CM | POA: Diagnosis not present

## 2019-03-23 DIAGNOSIS — J9601 Acute respiratory failure with hypoxia: Secondary | ICD-10-CM | POA: Diagnosis not present

## 2019-03-23 DIAGNOSIS — J9611 Chronic respiratory failure with hypoxia: Secondary | ICD-10-CM | POA: Diagnosis not present

## 2019-03-23 DIAGNOSIS — D0220 Carcinoma in situ of unspecified bronchus and lung: Secondary | ICD-10-CM | POA: Diagnosis not present

## 2019-03-23 DIAGNOSIS — I272 Pulmonary hypertension, unspecified: Secondary | ICD-10-CM | POA: Diagnosis not present

## 2019-03-23 DIAGNOSIS — M6281 Muscle weakness (generalized): Secondary | ICD-10-CM | POA: Diagnosis not present

## 2019-03-26 ENCOUNTER — Encounter: Payer: Self-pay | Admitting: Nurse Practitioner

## 2019-03-26 ENCOUNTER — Other Ambulatory Visit: Payer: Self-pay

## 2019-03-26 ENCOUNTER — Non-Acute Institutional Stay: Payer: Medicare HMO | Admitting: Nurse Practitioner

## 2019-03-26 DIAGNOSIS — G629 Polyneuropathy, unspecified: Secondary | ICD-10-CM | POA: Diagnosis not present

## 2019-03-26 DIAGNOSIS — D500 Iron deficiency anemia secondary to blood loss (chronic): Secondary | ICD-10-CM | POA: Diagnosis not present

## 2019-03-26 DIAGNOSIS — E876 Hypokalemia: Secondary | ICD-10-CM | POA: Diagnosis not present

## 2019-03-26 DIAGNOSIS — R69 Illness, unspecified: Secondary | ICD-10-CM | POA: Diagnosis not present

## 2019-03-26 DIAGNOSIS — J9611 Chronic respiratory failure with hypoxia: Secondary | ICD-10-CM | POA: Diagnosis not present

## 2019-03-26 DIAGNOSIS — K219 Gastro-esophageal reflux disease without esophagitis: Secondary | ICD-10-CM

## 2019-03-26 DIAGNOSIS — F418 Other specified anxiety disorders: Secondary | ICD-10-CM

## 2019-03-26 DIAGNOSIS — E871 Hypo-osmolality and hyponatremia: Secondary | ICD-10-CM

## 2019-03-26 DIAGNOSIS — M6281 Muscle weakness (generalized): Secondary | ICD-10-CM | POA: Diagnosis not present

## 2019-03-26 DIAGNOSIS — I10 Essential (primary) hypertension: Secondary | ICD-10-CM

## 2019-03-26 DIAGNOSIS — J9601 Acute respiratory failure with hypoxia: Secondary | ICD-10-CM | POA: Diagnosis not present

## 2019-03-26 NOTE — Assessment & Plan Note (Signed)
blood pressure is controlled, continue  Atenolol 1m qd, Losartan 574mqd

## 2019-03-26 NOTE — Assessment & Plan Note (Signed)
Her mood is stable, continue  Duloxetine 80m qd.

## 2019-03-26 NOTE — Assessment & Plan Note (Signed)
anemia, continue Folic acid 494WHQ qd, Fe 266m qd, last Hgb 11.7 03/12/19.

## 2019-03-26 NOTE — Progress Notes (Signed)
Location:   [facility] Room Number: 607 Place of Service:  ALF (13)clinic [facility] Provider: Marlana Latus NP  Code Status: DNR Goals of Care: [place] Advanced Directives 03/17/2019  Does Patient Have a Medical Advance Directive? Yes  Type of Advance Directive Out of facility DNR (pink MOST or yellow form)  Does patient want to make changes to medical advance directive? No - Patient declined  Copy of [place] in Chart? -  Would patient like information on creating a medical advance directive? -  Pre-existing out of facility DNR order (yellow form or pink MOST form) Pink MOST form placed in chart (order not valid for inpatient use)     Chief Complaint  Patient presents with  . Medical Management of Chronic Issues    HPI: Patient is a 85 y.o. female seen today for evaluation of chronic medical conditions.   The patient has chronic hyponatremia, last Na 131 3/26/200 taking NaCL 1G tid, hypokalemia, last K 5.3, on KCL 53mq qd, GERD stable on Omeprazole 227mqd. HTN, blood pressure is controlled on Atenolol 2531md, Losartan 29m44m. Peripheral neuropathy, stable on Gabapentin 100mg33m, Tylenol 500mg 62m  anemia, on Folic acid 400mcg371GGYe 240mg q53mast Hgb 11.7 03/12/19.  Her mood is stable on Duloxetine 30mg qd26mPast Medical History:  Diagnosis Date  . Allergic rhinitis   . Anemia    NOS  . Anxiety   . Biceps tendon rupture    Bilateral  . Breast cancer (HCC) [place] Left s/p lumpectomy and XRT   . Bronchiectasis (HCC) [place] Noted on chest x-ray  . Complication of anesthesia    slow to wake up  . Cough 2014  . Depression   . Diverticulosis   . Elevated liver enzymes    Biopsy consistent with steatohepatitis  . Fundic gland polyps of stomach, benign   . GERD (gastroesophageal reflux disease) 06/2007   EGD Dr Rourk >sGala Romney multiple fundic gland polyps  . Glaucoma 2013   both eyes  . Hemorrhoids   . Hiatal hernia   . Hx of radiation therapy  07/17/10 to 07/21/10   SRS LLL lung  . Hyperlipidemia   . Insomnia   . Low back pain    scoliosis  . Lung cancer (HCC) 6/[place]   Left 2011 - rad x 3  . Lymphedema    Left arm  . Osteoarthritis   . Osteopenia 10/25/2016  . Osteoporosis, senile   . Overactive bladder   . Pneumonia    RLL with sepsis  . PSVT (paroxysmal supraventricular tachycardia) (HCC)   .Appletoneumatic fever    Age 79  . Ri32t thyroid nodule   . Steatohepatitis    liver bx  . Type 2 diabetes mellitus (HCC)    [facility] Surgical History:  Procedure Laterality Date  . ABDOMINAL HYSTERECTOMY  1964  . APPENDECTOMY  1964  . BIOPSY THYROID  11/2008  . BREAST BIOPSY     X 3 w/ cystectomy  . BREAST LUMPECTOMY  2001  . CATARACT EXTRACTION Bilateral 2003   Lens implant Dr. Epes  . Charise KillianECYSTECTOMY  1965  . COLONOSCOPY  09/11/2011   Procedure: COLONOSCOPY;  Surgeon: Sandi M Dorothyann Pengocation: [facility];  Service: Endoscopy;  Laterality: N/A;  . COLONOSCOPY  2005 /2012   Dr. Reham- dLucianne Mussiculosis, ext hemorrhoids.  . COLONOSCOPY WITH PROPOFOL N/A 11/09/2017   Procedure: COLONOSCOPY WITH PROPOFOL;  Surgeon: Mauri Pole, MD;  Location: [facility];  Service: Endoscopy;  Laterality: N/A;  . COLONOSCOPY WITH PROPOFOL N/A 11/12/2017   Procedure: COLONOSCOPY WITH PROPOFOL;  Surgeon: Ladene Artist, MD;  Location: [facility];  Service: Endoscopy;  Laterality: N/A;  . CYSTOSCOPY  2007  . ESOPHAGOGASTRODUODENOSCOPY  09/11/2011   Procedure: ESOPHAGOGASTRODUODENOSCOPY (EGD);  Surgeon: Dorothyann Peng, MD;  [facility];  Service: Endoscopy;  Laterality: N/A;  . ESOPHAGOGASTRODUODENOSCOPY (EGD) WITH PROPOFOL N/A 11/08/2017   Procedure: ESOPHAGOGASTRODUODENOSCOPY (EGD) WITH PROPOFOL;  Surgeon: Mauri Pole, MD;  Location: [facility];  Service: Endoscopy;  Laterality: N/A;  . LIVER BIOPSY  2008  . LUNG BIOPSY Left 06/06/2010  . REVISION TOTAL HIP ARTHROPLASTY  2007   Left  . SKIN CANCER EXCISION      nose and left lower cheek  . TONSILLECTOMY  1930  . TOTAL HIP ARTHROPLASTY Bilateral 2005 & 2007    Dr. Earl Lagos. Aline Brochure     Allergies  Allergen Reactions  . Augmentin [Amoxicillin-Pot Clavulanate] Other (See Comments)    unknown  . Ibuprofen Hives  . Naproxen Other (See Comments)    unknown  . Statins Other (See Comments)    Feels like knives in stomach  . Surgilube [Gyne-Moistrin]     Rectal itching, burning  . Tape Rash    Allergies as of 03/26/2019      Reactions   Augmentin [amoxicillin-pot Clavulanate] Other (See Comments)   unknown   Ibuprofen Hives   Naproxen Other (See Comments)   unknown   Statins Other (See Comments)   Feels like knives in stomach   Surgilube [gyne-moistrin]    Rectal itching, burning   Tape Rash      Medication List       Accurate as of March 26, 2019 11:59 PM. Always use your most recent med list.        acetaminophen 500 MG tablet Commonly known as:  TYLENOL Take 500 mg by mouth 2 (two) times daily.   atenolol 25 MG tablet Commonly known as:  TENORMIN Take 1 tablet (25 mg total) by mouth daily.   B Complex-B12 Tabs Take 1 tablet by mouth daily.   DULoxetine 30 MG capsule Commonly known as:  CYMBALTA Take 1 capsule (30 mg total) by mouth daily.   ferrous gluconate 240 (27 FE) MG tablet Commonly known as:  FERGON Take 1 tablet (240 mg total) by mouth daily.   folic acid 932 MCG tablet Commonly known as:  FOLVITE Take 1 tablet (400 mcg total) by mouth daily.   gabapentin 100 MG capsule Commonly known as:  NEURONTIN Take 1 capsule (100 mg total) by mouth 2 (two) times daily.   latanoprost 0.005 % ophthalmic solution Commonly known as:  XALATAN Place 1 drop into both eyes at bedtime.   lidocaine 5 % Commonly known as:  LIDODERM Place 1 patch onto the skin daily as needed. Remove & Discard patch within 12 hours or as directed by MD   losartan 50 MG tablet Commonly known as:  COZAAR Take 1 tablet (50 mg total) by  mouth daily.   magnesium oxide 400 (241.3 Mg) MG tablet Commonly known as:  MAG-OX Take 1 tablet (400 mg total) by mouth 2 (two) times daily.   Menthol (Topical Analgesic) 4 % Gel Commonly known as:  Biofreeze Apply 1 application topically every 8 (eight) hours as needed. Apply to both knees   omeprazole 20 MG capsule Commonly known as:  PRILOSEC Take  1 capsule (20 mg total) by mouth daily.   polyethylene glycol 17 g packet Commonly known as:  MIRALAX / GLYCOLAX Take 17 g by mouth daily as needed.   potassium chloride SA 20 MEQ tablet Commonly known as:  K-DUR,KLOR-CON Take 20 mEq by mouth daily.   PreserVision AREDS 2 Caps Take 1 capsule by mouth 2 (two) times daily.   Simethicone 80 MG Tabs Take 1 tablet (80 mg total) by mouth 4 (four) times daily as needed for up to 30 days (indigestion).   sodium chloride 1 g tablet Take 1 tablet (1 g total) by mouth 3 (three) times daily with meals for 30 days.   Sodium Fluoride 1.1 % Pste Commonly known as:  PreviDent 5000 Booster Plus Apply to teeth nightly       Review of Systems:  Review of Systems  Constitutional: Negative for activity change, appetite change, chills, diaphoresis, fatigue and fever.  HENT: Positive for hearing loss. Negative for congestion and voice change.   Respiratory: Negative for cough, shortness of breath and wheezing.   Cardiovascular: Negative for chest pain, palpitations and leg swelling.  Gastrointestinal: Negative for abdominal distention, abdominal pain, constipation, diarrhea, nausea and vomiting.  Genitourinary: Negative for difficulty urinating, dysuria and urgency.  Musculoskeletal: Positive for back pain and gait problem.  Skin: Negative for color change and pallor.  Neurological: Negative for dizziness, speech difficulty, weakness and headaches.       Memory lapses.   Psychiatric/Behavioral: Positive for dysphoric mood. Negative for agitation, behavioral problems, hallucinations and sleep  disturbance. The patient is not nervous/anxious.     Health Maintenance  Topic Date Due  . FOOT EXAM  07/19/1931  . HEMOGLOBIN A1C  10/17/2017  . TETANUS/TDAP  10/13/2029 (Originally 12/18/2015)  . INFLUENZA VACCINE  07/18/2019  . OPHTHALMOLOGY EXAM  12/27/2019  . DEXA SCAN  Completed  . PNA vac Low Risk Adult  Completed    Physical Exam: Vitals:   03/26/19 1545  BP: 112/62  Pulse: 66  Resp: 18  Temp: 97.8 F (36.6 C)  SpO2: 97%  Weight: 148 lb 12.8 oz (67.5 kg)  Height: 5' 4"  (1.626 m)   Body mass index is 25.54 kg/m. Physical Exam Constitutional:      General: She is not in acute distress.    Appearance: Normal appearance. She is not ill-appearing, toxic-appearing or diaphoretic.  HENT:     Head: Normocephalic and atraumatic.     Nose: Nose normal.     Mouth/Throat:     Mouth: Mucous membranes are moist.  Eyes:     Extraocular Movements: Extraocular movements intact.     Conjunctiva/sclera: Conjunctivae normal.     Pupils: Pupils are equal, round, and reactive to light.  Neck:     Musculoskeletal: Neck supple.  Cardiovascular:     Rate and Rhythm: Normal rate and regular rhythm.     Heart sounds: No murmur.  Pulmonary:     Effort: Pulmonary effort is normal.     Breath sounds: No wheezing, rhonchi or rales.  Abdominal:     General: There is no distension.     Palpations: Abdomen is soft.     Tenderness: There is no abdominal tenderness. There is no right CVA tenderness, left CVA tenderness, guarding or rebound.  Musculoskeletal:     Right lower leg: Edema present.     Left lower leg: Edema present.     Comments: Trace edema BLE  Skin:    General: Skin is warm and  dry.  Neurological:     General: No focal deficit present.     Mental Status: She is alert and oriented to person, place, and time. Mental status is at baseline.     Cranial Nerves: No cranial nerve deficit.     Motor: No weakness.     Coordination: Coordination normal.     Gait: Gait  abnormal.  Psychiatric:        Mood and Affect: Mood normal.     Labs reviewed: Basic Metabolic Panel: Recent Labs    08/10/18 0053  02/21/19 1721  02/28/19 0356  03/03/19 0408 03/04/19 0325 03/05/19 0359 03/12/19  NA 138   < > 132*   < > 127*   < > 130* 130* 129* 131*  K 4.1   < > 3.3*   < > 4.5   < > 4.1 4.1 4.4 5.3  CL 97*   < > 90*   < > 87*   < > 93* 92* 95*  --   CO2 26   < > 30   < > 29   < > 30 29 25   --   GLUCOSE 87   < > 98   < > 85   < > 137* 102* 84  --   BUN 17   < > 18   < > 30*   < > 24* 20 21 20   CREATININE 0.99   < > 0.78   < > 0.94   < > 0.87 0.86 0.81 0.8  CALCIUM 9.8   < > 8.9   < > 8.7*   < > 9.0 9.2 8.9  --   MG  --   --  1.5*   < > 1.9  --   --  1.9 1.9  --   PHOS  --   --  3.0  --   --   --   --   --   --   --   TSH 2.321  --  1.964  --   --   --   --   --   --   --    < > = values in this interval not displayed.   Liver Function Tests: Recent Labs    08/21/18 08/25/18 09/25/18 02/21/19 1721 03/12/19  AST 45 32 18 54* 30  ALT 47 29 11 39 27  ALKPHOS 134 122 139* 161* 113  BILITOT 0.6 0.4  --  1.7*  --   PROT 6.2 6.1  --  8.4*  --   ALBUMIN 2.7 2.6  --  3.8  --    Recent Labs    02/21/19 1721  LIPASE 39   Recent Labs    08/10/18 0053  AMMONIA 25   CBC: Recent Labs    08/09/18 1519  02/21/19 1721  03/01/19 0430 03/02/19 0433 03/04/19 0325 03/12/19  WBC 7.1   < > 7.5   < > 7.3 7.1 6.9 5.2  NEUTROABS 5.8  --  6.0  --   --   --   --   --   HGB 12.2   < > 12.9   < > 11.5* 11.6* 12.2 11.7*  HCT 36.5   < > 40.0   < > 35.3* 35.9* 39.1 35*  MCV 92.9   < > 99.8   < > 100.0 101.1* 102.1*  --   PLT 297   < > 175   < > 244 251 252 287   < > =  values in this interval not displayed.   Lipid Panel: No results for input(s): CHOL, HDL, LDLCALC, TRIG, CHOLHDL, LDLDIRECT in the last 8760 hours. Lab Results  Component Value Date   HGBA1C 5.3 04/16/2017    Procedures since last visit: No results found.  Assessment/Plan HTN (hypertension)  blood pressure is controlled, continue  Atenolol 21m qd, Losartan 519mqd  GERD (gastroesophageal reflux disease) Stable, continue Omeprazole 2054md.   Neuropathy Stable, continue Gabapentin 100m38md, Tylenol 500mg103m,   Blood loss anemia anemia, continue Folic acid 400mc227NTBFe 240mg 43mlast Hgb 11.7 03/12/19.  Depression with anxiety Her mood is stable, continue  Duloxetine 30mg q60m Hypokalemia last K 5.3, dc KCL 20meq q63mpdate BMP 2 weeks.   Hyponatremia  last Na 131 03/12/19, continue NaCL 1G tid,    Labs/tests ordered: none  Next appt:  prn

## 2019-03-26 NOTE — Assessment & Plan Note (Signed)
Stable, continue Omeprazole 82m qd.

## 2019-03-26 NOTE — Assessment & Plan Note (Signed)
Stable, continue Gabapentin 165m bid, Tylenol 5025mbid,

## 2019-03-26 NOTE — Assessment & Plan Note (Signed)
last K 5.3, dc KCL 81mq qd, update BMP 2 weeks.

## 2019-03-26 NOTE — Assessment & Plan Note (Signed)
last Na 131 03/12/19, continue NaCL 1G tid,

## 2019-03-27 DIAGNOSIS — J918 Pleural effusion in other conditions classified elsewhere: Secondary | ICD-10-CM | POA: Diagnosis not present

## 2019-03-31 ENCOUNTER — Non-Acute Institutional Stay: Payer: Medicare HMO | Admitting: Internal Medicine

## 2019-03-31 ENCOUNTER — Encounter: Payer: Self-pay | Admitting: Internal Medicine

## 2019-03-31 DIAGNOSIS — R69 Illness, unspecified: Secondary | ICD-10-CM | POA: Diagnosis not present

## 2019-03-31 DIAGNOSIS — G629 Polyneuropathy, unspecified: Secondary | ICD-10-CM

## 2019-03-31 DIAGNOSIS — R609 Edema, unspecified: Secondary | ICD-10-CM | POA: Diagnosis not present

## 2019-03-31 DIAGNOSIS — F418 Other specified anxiety disorders: Secondary | ICD-10-CM

## 2019-03-31 DIAGNOSIS — I5032 Chronic diastolic (congestive) heart failure: Secondary | ICD-10-CM | POA: Diagnosis not present

## 2019-03-31 NOTE — Addendum Note (Signed)
Addended by: Georgina Snell on: 03/31/2019 04:17 PM   Modules accepted: Level of Service

## 2019-03-31 NOTE — Progress Notes (Addendum)
Provider: Veleta Miners L,MD Location:  [place] Room Number: 035-K Place of Service:  ALF ([phone])  PCP: Virgie Dad, MD Patient Care Team: Virgie Dad, MD as PCP - General (Internal Medicine) Lanelle Bal., MD as Referring Physician (Internal Medicine) Satira Sark, MD as Consulting Physician (Cardiology) Kyung Rudd, MD as Consulting Physician (Radiation Oncology) Mast, Man X, NP as Nurse Practitioner (Internal Medicine)  Extended Emergency Contact Information Primary Emergency Contact: Neita Garnet of [place] Mobile Phone: [phone] Relation: Son Secondary Emergency Contact: Hornbaker,Lance Address: [address]          [place], [place] [address] [name] of [place] Phone: [phone] Mobile Phone: [phone] Relation: Son  Code Status:DNR Goals of Care: Advanced Directive information Advanced Directives 03/31/2019  Does Patient Have a Medical Advance Directive? Yes  Type of Paramedic of [place];Out of facility DNR (pink MOST or yellow form);Living will  Does patient want to make changes to medical advance directive? No - Patient declined  Copy of [place] in Chart? Yes - validated most recent copy scanned in chart (See row information)  Would patient like information on creating a medical advance directive? -  Pre-existing out of facility DNR order (yellow form or pink MOST form) Yellow form placed in chart (order not valid for inpatient use);Pink MOST form placed in chart (order not valid for inpatient use)      Chief Complaint  Patient presents with  . New admit to AL    Moved from SNF  to AL     HPI: Patient is a 85 y.o. female seen today for admission to AL . She is transferred from SNF. Patient was admitted in SNF after she stayed in the hospital from 03/07-03/19 for Left Pleural Effusion and Hyponatremia and Weakness  Patient has a history of diastolic  CHF, hyponatremia, hypertension, diabetes mellitus, PSVT and history of breast cancer and lung cancer,.Iron Def Anemia on Oxygen PRN Patient Lives in [place] in [place]. She was sent to the emergency room as she was complaining of progressively getting short of breath and weak. She was found to have a large left-sided pleural effusion on the chest x-ray.  She underwent thoracocentesis with removal of 700 mL of fluid which was transudative in nature.  She was treated with IV diuresis. Echo showed a EF of 60 to 65%.  CT scan of the chest was negative for PE or pneumonia Her other issue is hyponatremia which got little worse with Lasix.  She is now on sodium tablets 3 times a day instead of 1. She got therapy and is now back in her AL room. She is doing well denies any SOB or cough.Her weight is 125 lbs which is 15 lbs less then her previous weight when she went to ED. She is now back to be independent with her transfers though uses Wheelchair to get around in her Room  Past Medical History:  Diagnosis Date  . Allergic rhinitis   . Anemia    NOS  . Anxiety   . Biceps tendon rupture    Bilateral  . Breast cancer ([place]) 2001   Left s/p lumpectomy and XRT   . Bronchiectasis ([place]) 2014   Noted on chest x-ray  . Complication of anesthesia    slow to wake up  . Cough 2014  . Depression   . Diverticulosis   . Elevated liver enzymes    Biopsy consistent with steatohepatitis  .  Fundic gland polyps of stomach, benign   . GERD (gastroesophageal reflux disease) 06/2007   EGD Dr Gala Romney >sm HH, multiple fundic gland polyps  . Glaucoma 2013   both eyes  . Hemorrhoids   . Hiatal hernia   . Hx of radiation therapy 07/17/10 to 07/21/10   SRS LLL lung  . Hyperlipidemia   . Insomnia   . Low back pain    scoliosis  . Lung cancer ([facility]) 05/2010   Left 2011 - rad x 3  . Lymphedema    Left arm  . Osteoarthritis   . Osteopenia 10/25/2016  . Osteoporosis, senile   . Overactive bladder   . Pneumonia    RLL  with sepsis  . PSVT (paroxysmal supraventricular tachycardia) ([employer])   . Rheumatic fever    Age 58  . Right thyroid nodule   . Steatohepatitis    liver bx  . Type 2 diabetes mellitus ([place])    Past Surgical History:  Procedure Laterality Date  . ABDOMINAL HYSTERECTOMY  1964  . APPENDECTOMY  1964  . BIOPSY THYROID  11/2008  . BREAST BIOPSY     X 3 w/ cystectomy  . BREAST LUMPECTOMY  2001  . CATARACT EXTRACTION Bilateral 2003   Lens implant Dr. Charise Killian  . CHOLECYSTECTOMY  1965  . COLONOSCOPY  09/11/2011   Procedure: COLONOSCOPY;  Surgeon: Dorothyann Peng, MD;  [facility];  Service: Endoscopy;  Laterality: N/A;  . COLONOSCOPY  2005 /2012   Dr. Lucianne Muss- diverticulosis, ext hemorrhoids.  . COLONOSCOPY WITH PROPOFOL N/A 11/09/2017   Procedure: COLONOSCOPY WITH PROPOFOL;  Surgeon: Mauri Pole, MD;  Location: [facility];  Service: Endoscopy;  Laterality: N/A;  . COLONOSCOPY WITH PROPOFOL N/A 11/12/2017   Procedure: COLONOSCOPY WITH PROPOFOL;  Surgeon: Ladene Artist, MD;  Location: [facility];  Service: Endoscopy;  Laterality: N/A;  . CYSTOSCOPY  2007  . ESOPHAGOGASTRODUODENOSCOPY  09/11/2011   Procedure: ESOPHAGOGASTRODUODENOSCOPY (EGD);  Surgeon: Dorothyann Peng, MD;  [facility];  Service: Endoscopy;  Laterality: N/A;  . ESOPHAGOGASTRODUODENOSCOPY (EGD) WITH PROPOFOL N/A 11/08/2017   Procedure: ESOPHAGOGASTRODUODENOSCOPY (EGD) WITH PROPOFOL;  Surgeon: Mauri Pole, MD;  [facility];  Service: Endoscopy;  Laterality: N/A;  . LIVER BIOPSY  2008  . LUNG BIOPSY Left 06/06/2010  . REVISION TOTAL HIP ARTHROPLASTY  2007   Left  . SKIN CANCER EXCISION     nose and left lower cheek  . TONSILLECTOMY  1930  . TOTAL HIP ARTHROPLASTY Bilateral 2005 & 2007    Dr. Earl Lagos. Aline Brochure     reports that she has never smoked. She has never used smokeless tobacco. She reports that she does not drink alcohol or use drugs. Social History    Socioeconomic History  . Marital status: Widowed    Spouse name: Not on file  . Number of children: 2  . Years of education: college   . Highest education level: Not on file  Occupational History  . Occupation: retired Tax Comptroller: RETIRED  Social Needs  . Financial resource strain: Not hard at all  . Food insecurity:    Worry: Never true    Inability: Never true  . Transportation needs:    Medical: No    Non-medical: No  Tobacco Use  . Smoking status: Never Smoker  . Smokeless tobacco: Never Used  . Tobacco comment: 2nd hand-husband  Substance and Sexual Activity  . Alcohol use: No  . Drug use: No  .  Sexual activity: Not Currently  Lifestyle  . Physical activity:    Days per week: 7 days    Minutes per session: 30 min  . Stress: Not at all  Relationships  . Social connections:    Talks on phone: More than three times a week    Gets together: More than three times a week    Attends religious service: Never    Active member of club or organization: No    Attends meetings of clubs or organizations: Never    Relationship status: Widowed  . Intimate partner violence:    Fear of current or ex partner: No    Emotionally abused: No    Physically abused: No    Forced sexual activity: No  Other Topics Concern  . Not on file  Social History Narrative   Lives at [facility] 02/2013   2 adopted children   Was in [place], [place]   Widowed 2013   Walks with walker   Exercise: none    POA - Living Will    Functional Status Survey:    Family History  Problem Relation Age of Onset  . Heart failure Mother   . Osteoporosis Mother   . Hypertension Father   . Stroke Father   . Heart failure Father   . Leukemia Brother   . Heart failure Brother   . Cancer Maternal Grandmother   . Stroke Maternal Grandfather   . Cancer Other     Health Maintenance  Topic Date Due  . FOOT EXAM  07/19/1931  . HEMOGLOBIN A1C  10/17/2017  . TETANUS/TDAP  10/13/2029  (Originally 12/18/2015)  . INFLUENZA VACCINE  07/18/2019  . OPHTHALMOLOGY EXAM  12/27/2019  . DEXA SCAN  Completed  . PNA vac Low Risk Adult  Completed    Allergies  Allergen Reactions  . Augmentin [Amoxicillin-Pot Clavulanate] Other (See Comments)    unknown  . Ibuprofen Hives  . Naproxen Other (See Comments)    unknown  . Statins Other (See Comments)    Feels like knives in stomach  . Surgilube [Gyne-Moistrin]     Rectal itching, burning  . Tape Rash    Outpatient Encounter Medications as of 03/31/2019  Medication Sig  . acetaminophen (TYLENOL) 500 MG tablet Take 500 mg by mouth 2 (two) times daily.   Marland Kitchen atenolol (TENORMIN) 25 MG tablet Take 1 tablet (25 mg total) by mouth daily.  . B Complex Vitamins (B COMPLEX-B12) TABS Take 1 tablet by mouth daily.  . DULoxetine (CYMBALTA) 30 MG capsule Take 1 capsule (30 mg total) by mouth daily.  . ferrous gluconate (FERGON) 240 (27 FE) MG tablet Take 1 tablet (240 mg total) by mouth daily.  . folic acid (FOLVITE) 332 MCG tablet Take 1 tablet (400 mcg total) by mouth daily.  Marland Kitchen gabapentin (NEURONTIN) 100 MG capsule Take 1 capsule (100 mg total) by mouth 2 (two) times daily.  Marland Kitchen latanoprost (XALATAN) 0.005 % ophthalmic solution Place 1 drop into both eyes at bedtime.  Marland Kitchen losartan (COZAAR) 50 MG tablet Take 1 tablet (50 mg total) by mouth daily.  . magnesium oxide (MAG-OX) 400 (241.3 Mg) MG tablet Take 1 tablet (400 mg total) by mouth 2 (two) times daily.  . Multiple Vitamins-Minerals (PRESERVISION AREDS 2) CAPS Take 1 capsule by mouth 2 (two) times daily.  Marland Kitchen omeprazole (PRILOSEC) 20 MG capsule Take 1 capsule (20 mg total) by mouth daily.  . Simethicone 80 MG TABS Take 1 tablet (80 mg total)  by mouth 4 (four) times daily as needed for up to 30 days (indigestion).  . sodium chloride 1 g tablet Take 1 tablet (1 g total) by mouth 3 (three) times daily with meals for 30 days.  . Sodium Fluoride (PREVIDENT 5000 BOOSTER PLUS) 1.1 % PSTE Apply to teeth  nightly  . [DISCONTINUED] lidocaine (LIDODERM) 5 % Place 1 patch onto the skin daily as needed. Remove & Discard patch within 12 hours or as directed by MD  . [DISCONTINUED] Menthol, Topical Analgesic, (BIOFREEZE) 4 % GEL Apply 1 application topically every 8 (eight) hours as needed. Apply to both knees  . [DISCONTINUED] polyethylene glycol (MIRALAX / GLYCOLAX) packet Take 17 g by mouth daily as needed.  . [DISCONTINUED] potassium chloride SA (K-DUR,KLOR-CON) 20 MEQ tablet Take 20 mEq by mouth daily.   No facility-administered encounter medications on file as of 03/31/2019.     Review of Systems  Constitutional: Negative.   HENT: Negative.   Respiratory: Negative.   Cardiovascular: Positive for leg swelling.  Gastrointestinal: Positive for constipation.  Genitourinary: Positive for frequency.  Musculoskeletal: Negative.   Skin: Negative.   Neurological: Negative.   Psychiatric/Behavioral: Negative.     Vitals:   03/31/19 1135  BP: 132/66  Pulse: 74  Resp: 18  Temp: 98.1 F (36.7 C)  SpO2: 92%  Weight: 148 lb 12.8 oz (67.5 kg)  Height: 5' 4"  (1.626 m)   Body mass index is 25.54 kg/m. Physical Exam Vitals signs reviewed.  Constitutional:      Appearance: Normal appearance.  HENT:     Head: Normocephalic.     Nose: Nose normal.     Mouth/Throat:     Mouth: Mucous membranes are moist.     Pharynx: Oropharynx is clear.  Eyes:     Pupils: Pupils are equal, round, and reactive to light.  Neck:     Musculoskeletal: Neck supple.  Cardiovascular:     Rate and Rhythm: Normal rate and regular rhythm.  Pulmonary:     Effort: Pulmonary effort is normal.     Breath sounds: Normal breath sounds.     Comments: Has few Crackles in Left Lower lobe Abdominal:     General: Abdomen is flat. Bowel sounds are normal.     Palpations: Abdomen is soft.  Musculoskeletal:     Comments: Mild Swelling bilateral  Skin:    General: Skin is warm and dry.  Neurological:     General: No  focal deficit present.     Mental Status: She is alert and oriented to person, place, and time.  Psychiatric:        Mood and Affect: Mood normal.        Thought Content: Thought content normal.        Judgment: Judgment normal.     Labs reviewed: Basic Metabolic Panel: Recent Labs    02/21/19 1721  02/28/19 0356  03/03/19 0408 03/04/19 0325 03/05/19 0359 03/12/19  NA 132*   < > 127*   < > 130* 130* 129* 131*  K 3.3*   < > 4.5   < > 4.1 4.1 4.4 5.3  CL 90*   < > 87*   < > 93* 92* 95*  --   CO2 30   < > 29   < > 30 29 25   --   GLUCOSE 98   < > 85   < > 137* 102* 84  --   BUN 18   < > 30*   < >  24* 20 21 20   CREATININE 0.78   < > 0.94   < > 0.87 0.86 0.81 0.8  CALCIUM 8.9   < > 8.7*   < > 9.0 9.2 8.9  --   MG 1.5*   < > 1.9  --   --  1.9 1.9  --   PHOS 3.0  --   --   --   --   --   --   --    < > = values in this interval not displayed.   Liver Function Tests: Recent Labs    08/21/18 08/25/18 09/25/18 02/21/19 1721 03/12/19  AST 45 32 18 54* 30  ALT 47 29 11 39 27  ALKPHOS 134 122 139* 161* 113  BILITOT 0.6 0.4  --  1.7*  --   PROT 6.2 6.1  --  8.4*  --   ALBUMIN 2.7 2.6  --  3.8  --    Recent Labs    02/21/19 1721  LIPASE 39   Recent Labs    08/10/18 0053  AMMONIA 25   CBC: Recent Labs    08/09/18 1519  02/21/19 1721  03/01/19 0430 03/02/19 0433 03/04/19 0325 03/12/19  WBC 7.1   < > 7.5   < > 7.3 7.1 6.9 5.2  NEUTROABS 5.8  --  6.0  --   --   --   --   --   HGB 12.2   < > 12.9   < > 11.5* 11.6* 12.2 11.7*  HCT 36.5   < > 40.0   < > 35.3* 35.9* 39.1 35*  MCV 92.9   < > 99.8   < > 100.0 101.1* 102.1*  --   PLT 297   < > 175   < > 244 251 252 287   < > = values in this interval not displayed.   Cardiac Enzymes: Recent Labs    02/21/19 1721 02/22/19 0450 02/22/19 1646  TROPONINI 0.05* 0.10* 0.12*   BNP: Invalid input(s): POCBNP Lab Results  Component Value Date   HGBA1C 5.3 04/16/2017   Lab Results  Component Value Date   TSH 1.964  02/21/2019   Lab Results  Component Value Date   VITAMINB12 546 09/10/2011   Lab Results  Component Value Date   FOLATE >20.0 09/10/2011   Lab Results  Component Value Date   IRON 36 (L) 12/24/2017   TIBC 248 (L) 09/10/2011   FERRITIN 251 12/24/2017    Imaging and Procedures obtained prior to SNF admission: Dg Chest 1 View  Result Date: 02/21/2019 CLINICAL DATA:  Weakness, shortness of Breath EXAM: CHEST  1 VIEW COMPARISON:  08/11/2018 FINDINGS: Large left pleural effusion, slightly increased since prior study. Cardiomegaly with vascular congestion. Left base atelectasis. Interstitial prominence throughout the lungs could reflect interstitial edema. IMPRESSION: Cardiomegaly with vascular congestion and probable mild interstitial edema. Large left pleural effusion with left base atelectasis. Electronically Signed   By: Rolm Baptise M.D.   On: 02/21/2019 17:06   Ct Head Wo Contrast  Result Date: 02/21/2019 CLINICAL DATA:  Weakness.  Hypokalemia.  Elevated blood pressure. EXAM: CT HEAD WITHOUT CONTRAST TECHNIQUE: Contiguous axial images were obtained from the base of the skull through the vertex without intravenous contrast. COMPARISON:  August 09, 2017 FINDINGS: Brain: No subdural, epidural, or subarachnoid hemorrhage identified. Cerebellum, brainstem, and basal cisterns are normal. Ventricles and sulci are unchanged and unremarkable. Moderate to severe white matter changes are similar in the interval. No acute  cortical ischemia or infarct. No mass effect or midline shift. Vascular: No hyperdense vessel or unexpected calcification. Skull: Normal. Negative for fracture or focal lesion. Sinuses/Orbits: No acute finding. Other: None. IMPRESSION: 1. Chronic white matter changes. No acute intracranial abnormalities noted. Electronically Signed   By: Dorise Bullion III M.D   On: 02/21/2019 17:07    Assessment/Plan  Chronic diastolic CHF (congestive heart failure) with Pleural Effusion Patient  does not want to restart her Lasix  She is worried about going to bathroom and Hyponatremia Will watch her Weight Closely Will repeat Chest Xray to follow her Left Pleural effusion  Hyponatremia Has been Stable here in faility Essential hypertension Stable on Cozaar PSVT On Tenormin Depression with anxiety Continue on Cymbalta Iron deficiency Anemia is resolved Stable on Iron Neuropathy Continue on Neurontin  Family/ staff Communication:   Labs/tests ordered: Repeat Chest Xray on 2 weeks  Total time spent in this patient care encounter was  40_  minutes; greater than 50% of the visit spent counseling patient and staff, reviewing records , Labs and coordinating care for problems addressed at this encounter.

## 2019-04-09 DIAGNOSIS — E876 Hypokalemia: Secondary | ICD-10-CM | POA: Diagnosis not present

## 2019-04-14 DIAGNOSIS — J918 Pleural effusion in other conditions classified elsewhere: Secondary | ICD-10-CM | POA: Diagnosis not present

## 2019-04-28 DIAGNOSIS — I270 Primary pulmonary hypertension: Secondary | ICD-10-CM | POA: Diagnosis not present

## 2019-05-01 DIAGNOSIS — H401111 Primary open-angle glaucoma, right eye, mild stage: Secondary | ICD-10-CM | POA: Diagnosis not present

## 2019-05-01 DIAGNOSIS — H401122 Primary open-angle glaucoma, left eye, moderate stage: Secondary | ICD-10-CM | POA: Diagnosis not present

## 2019-05-07 DIAGNOSIS — E876 Hypokalemia: Secondary | ICD-10-CM | POA: Diagnosis not present

## 2019-05-26 DIAGNOSIS — E876 Hypokalemia: Secondary | ICD-10-CM | POA: Diagnosis not present

## 2019-06-09 DIAGNOSIS — E876 Hypokalemia: Secondary | ICD-10-CM | POA: Diagnosis not present

## 2019-06-22 DIAGNOSIS — Z1159 Encounter for screening for other viral diseases: Secondary | ICD-10-CM | POA: Diagnosis not present

## 2019-06-23 DIAGNOSIS — E876 Hypokalemia: Secondary | ICD-10-CM | POA: Diagnosis not present

## 2019-06-23 LAB — NOVEL CORONAVIRUS, NAA: SARS-CoV-2, NAA: NOT DETECTED

## 2019-06-24 LAB — BASIC METABOLIC PANEL
BUN: 35 — AB (ref 4–21)
Creatinine: 1 (ref 0.5–1.1)
Glucose: 79
Potassium: 5.4 — AB (ref 3.4–5.3)
Sodium: 131 — AB (ref 137–147)

## 2019-06-30 ENCOUNTER — Encounter: Payer: Self-pay | Admitting: Internal Medicine

## 2019-06-30 ENCOUNTER — Non-Acute Institutional Stay: Payer: Medicare HMO | Admitting: Internal Medicine

## 2019-06-30 DIAGNOSIS — I5032 Chronic diastolic (congestive) heart failure: Secondary | ICD-10-CM

## 2019-06-30 DIAGNOSIS — I10 Essential (primary) hypertension: Secondary | ICD-10-CM | POA: Diagnosis not present

## 2019-06-30 DIAGNOSIS — E871 Hypo-osmolality and hyponatremia: Secondary | ICD-10-CM | POA: Diagnosis not present

## 2019-06-30 DIAGNOSIS — E611 Iron deficiency: Secondary | ICD-10-CM

## 2019-06-30 DIAGNOSIS — E876 Hypokalemia: Secondary | ICD-10-CM | POA: Diagnosis not present

## 2019-06-30 DIAGNOSIS — I471 Supraventricular tachycardia: Secondary | ICD-10-CM

## 2019-06-30 DIAGNOSIS — J918 Pleural effusion in other conditions classified elsewhere: Secondary | ICD-10-CM | POA: Diagnosis not present

## 2019-06-30 DIAGNOSIS — G629 Polyneuropathy, unspecified: Secondary | ICD-10-CM

## 2019-06-30 NOTE — Progress Notes (Signed)
Location:  [facility] Room Number: 726 Place of Service:  ALF [phone]) Provider:  L,MD   Virgie Dad, MD  Patient Care Team: Virgie Dad, MD as PCP - General (Internal Medicine) Lannette Donath Glo Herring., MD as Referring Physician (Internal Medicine) Satira Sark, MD as Consulting Physician (Cardiology) Kyung Rudd, MD as Consulting Physician (Radiation Oncology) Mast, Man X, NP as Nurse Practitioner (Internal Medicine)  Extended Emergency Contact Information Primary Emergency Contact: Neita Garnet of [place] Phone: [phone] Relation: Son Secondary Emergency Contact: Vandall,Lance Address: [address]          [place], [place] [address] [name] of [place] Phone: [phone] Mobile Phone: [phone] Relation: Son  Code Status:DNR Goals of care: Advanced Directive information Advanced Directives 06/30/2019  Does Patient Have a Medical Advance Directive? Yes  Type of Advance Directive Out of facility DNR (pink MOST or yellow form)  Does patient want to make changes to medical advance directive? No - Patient declined  Copy of [place] in Chart? -  Would patient like information on creating a medical advance directive? -  Pre-existing out of facility DNR order (yellow form or pink MOST form) Yellow form placed in chart (order not valid for inpatient use);Pink MOST form placed in chart (order not valid for inpatient use)     Chief Complaint  Patient presents with  . Medical Management of Chronic Issues    Routine visit   . Health Maintenance    foot exam, Hemoglobin A1c     HPI:  Pt is a 85 y.o. female seen today for medical management of chronic diseases.   Patient has a history of diastolic CHF, hyponatremia, hypertension, diabetes mellitus, PSVT and history of breast cancer and lung cancer,.Iron Def Anemia on Oxygen PRN,Left Sided Pleural Effusion  Patient lives in [place] and friends  home Patient is doing well.  Did not have any acute complaints today.  She denies any shortness of breath or cough.  She uses wheelchair/walker as needed. Her appetite is good she has gained almost 6 pounds. Patient also was restarted on sodium tablets as her sodium will 131 No new nursing issues   Past Medical History:  Diagnosis Date  . Allergic rhinitis   . Anemia    NOS  . Anxiety   . Biceps tendon rupture    Bilateral  . Breast cancer ([facility]) 2001   Left s/p lumpectomy and XRT   . Bronchiectasis ([facility]) 2014   Noted on chest x-ray  . Complication of anesthesia    slow to wake up  . Cough 2014  . Depression   . Diverticulosis   . Elevated liver enzymes    Biopsy consistent with steatohepatitis  . Fundic gland polyps of stomach, benign   . GERD (gastroesophageal reflux disease) 06/2007   EGD Dr Gala Romney >sm HH, multiple fundic gland polyps  . Glaucoma 2013   both eyes  . Hemorrhoids   . Hiatal hernia   . Hx of radiation therapy 07/17/10 to 07/21/10   SRS LLL lung  . Hyperlipidemia   . Insomnia   . Low back pain    scoliosis  . Lung cancer ([facility]) 05/2010   Left 2011 - rad x 3  . Lymphedema    Left arm  . Osteoarthritis   . Osteopenia 10/25/2016  . Osteoporosis, senile   . Overactive bladder   . Pneumonia    RLL with sepsis  . PSVT (paroxysmal supraventricular  tachycardia) ([facility])   . Rheumatic fever    Age 89  . Right thyroid nodule   . Steatohepatitis    liver bx  . Type 2 diabetes mellitus ([facility])    Past Surgical History:  Procedure Laterality Date  . ABDOMINAL HYSTERECTOMY  1964  . APPENDECTOMY  1964  . BIOPSY THYROID  11/2008  . BREAST BIOPSY     X 3 w/ cystectomy  . BREAST LUMPECTOMY  2001  . CATARACT EXTRACTION Bilateral 2003   Lens implant Dr. Charise Killian  . CHOLECYSTECTOMY  1965  . COLONOSCOPY  09/11/2011   Procedure: COLONOSCOPY;  Surgeon: Dorothyann Peng, MD;  Location[facility];  Service: Endoscopy;  Laterality: N/A;  . COLONOSCOPY  2005 /2012   Dr.  Lucianne Muss- diverticulosis, ext hemorrhoids.  . COLONOSCOPY WITH PROPOFOL N/A 11/09/2017   Procedure: COLONOSCOPY WITH PROPOFOL;  Surgeon: Mauri Pole, MD;  Location: [facility];  Service: Endoscopy;  Laterality: N/A;  . COLONOSCOPY WITH PROPOFOL N/A 11/12/2017   Procedure: COLONOSCOPY WITH PROPOFOL;  Surgeon: Ladene Artist, MD;  Location: [facility];  Service: Endoscopy;  Laterality: N/A;  . CYSTOSCOPY  2007  . ESOPHAGOGASTRODUODENOSCOPY  09/11/2011   Procedure: ESOPHAGOGASTRODUODENOSCOPY (EGD);  Surgeon: Dorothyann Peng, MD;  [facility];  Service: Endoscopy;  Laterality: N/A;  . ESOPHAGOGASTRODUODENOSCOPY (EGD) WITH PROPOFOL N/A 11/08/2017   Procedure: ESOPHAGOGASTRODUODENOSCOPY (EGD) WITH PROPOFOL;  Surgeon: Mauri Pole, MD;  Location: [facility];  Service: Endoscopy;  Laterality: N/A;  . LIVER BIOPSY  2008  . LUNG BIOPSY Left 06/06/2010  . REVISION TOTAL HIP ARTHROPLASTY  2007   Left  . SKIN CANCER EXCISION     nose and left lower cheek  . TONSILLECTOMY  1930  . TOTAL HIP ARTHROPLASTY Bilateral 2005 & 2007    Dr. Earl Lagos. Aline Brochure     Allergies  Allergen Reactions  . Augmentin [Amoxicillin-Pot Clavulanate] Other (See Comments)    unknown  . Ibuprofen Hives  . Naproxen Other (See Comments)    unknown  . Statins Other (See Comments)    Feels like knives in stomach  . Surgilube [Gyne-Moistrin]     Rectal itching, burning  . Tape Rash    Outpatient Encounter Medications as of 06/30/2019  Medication Sig  . acetaminophen (TYLENOL) 500 MG tablet Take 500 mg by mouth 2 (two) times daily.   Marland Kitchen atenolol (TENORMIN) 25 MG tablet Take 1 tablet (25 mg total) by mouth daily.  . B Complex Vitamins (B COMPLEX-B12) TABS Take 1 tablet by mouth daily.  . DULoxetine (CYMBALTA) 30 MG capsule Take 1 capsule (30 mg total) by mouth daily.  . ferrous gluconate (FERGON) 240 (27 FE) MG tablet Take 1 tablet (240 mg total) by mouth daily.  . folic acid (FOLVITE) 527  MCG tablet Take 1 tablet (400 mcg total) by mouth daily.  Marland Kitchen gabapentin (NEURONTIN) 100 MG capsule Take 1 capsule (100 mg total) by mouth 2 (two) times daily.  Marland Kitchen latanoprost (XALATAN) 0.005 % ophthalmic solution Place 1 drop into both eyes at bedtime.  Marland Kitchen losartan (COZAAR) 50 MG tablet Take 1 tablet (50 mg total) by mouth daily.  . magnesium oxide (MAG-OX) 400 (241.3 Mg) MG tablet Take 1 tablet (400 mg total) by mouth 2 (two) times daily.  . Multiple Vitamins-Minerals (PRESERVISION AREDS 2) CAPS Take 1 capsule by mouth 2 (two) times daily.  . Nutritional Supplements (FEEDING SUPPLEMENT, BOOST BREEZE,) LIQD Take 237 mLs by mouth 2 (two) times daily.  Marland Kitchen omeprazole (PRILOSEC)  20 MG capsule Take 1 capsule (20 mg total) by mouth daily.  . simethicone (MYLICON) 80 MG chewable tablet Chew 80 mg by mouth 4 (four) times daily as needed for flatulence.  . sodium chloride 1 g tablet Take 1 g by mouth daily.  . Sodium Fluoride (PREVIDENT 5000 BOOSTER PLUS) 1.1 % PSTE Apply to teeth nightly  . Simethicone 80 MG TABS Take 1 tablet (80 mg total) by mouth 4 (four) times daily as needed for up to 30 days (indigestion).   No facility-administered encounter medications on file as of 06/30/2019.     Review of Systems Review of Systems  Constitutional: Negative for activity change, appetite change, chills, diaphoresis, fatigue and fever.  HENT: Negative for mouth sores, postnasal drip, rhinorrhea, sinus pain and sore throat.   Respiratory: Negative for apnea, cough, chest tightness, shortness of breath and wheezing.   Cardiovascular: Negative for chest pain, palpitations and Positive for leg swelling.  Gastrointestinal: Negative for abdominal distention, abdominal pain, constipation, diarrhea, nausea and vomiting.  Genitourinary: Negative for dysuria and Positive for frequency.  Musculoskeletal: Negative for arthralgias, joint swelling and myalgias.  Skin: Negative for rash.  Neurological: Negative for dizziness,  syncope, weakness, light-headedness and numbness.  Psychiatric/Behavioral: Negative for behavioral problems, confusion and sleep disturbance.     Immunization History  Administered Date(s) Administered  . Influenza Whole 09/22/2008, 09/16/2012, 09/30/2013  . Influenza, High Dose Seasonal PF 09/25/2017  . Influenza,inj,Quad PF,6+ Mos 10/09/2018  . Influenza-Unspecified 10/02/2014, 09/15/2015, 09/27/2016  . Pneumococcal Conjugate-13 11/29/2017  . Pneumococcal Polysaccharide-23 08/17/2006  . Td 12/17/2005   Pertinent  Health Maintenance Due  Topic Date Due  . FOOT EXAM  07/19/1931  . HEMOGLOBIN A1C  10/17/2017  . INFLUENZA VACCINE  07/18/2019  . OPHTHALMOLOGY EXAM  12/27/2019  . DEXA SCAN  Completed  . PNA vac Low Risk Adult  Completed   Fall Risk  10/07/2018 09/19/2017 05/17/2016 04/20/2016 04/19/2016  Falls in the past year? Yes No No No No  Number falls in past yr: 1 - - - -  Injury with Fall? No - - - -   Functional Status Survey:    Vitals:   06/30/19 0844  BP: 126/60  Pulse: 62  Resp: 20  Temp: (!) 97 F (36.1 C)  SpO2: 94%  Weight: 129 lb 9.6 oz (58.8 kg)  Height: 5' 4"  (1.626 m)   Body mass index is 22.25 kg/m. Physical Exam Vitals signs reviewed.  Constitutional:      Appearance: Normal appearance.  HENT:     Head: Normocephalic.     Nose: Nose normal.     Mouth/Throat:     Mouth: Mucous membranes are moist.     Pharynx: Oropharynx is clear.  Eyes:     Pupils: Pupils are equal, round, and reactive to light.  Neck:     Musculoskeletal: Neck supple.  Cardiovascular:     Rate and Rhythm: Normal rate. Rhythm irregular.     Pulses: Normal pulses.  Pulmonary:     Effort: Pulmonary effort is normal.     Comments: Has Rales Bilateral in Lower Base Abdominal:     General: Abdomen is flat. Bowel sounds are normal.     Palpations: Abdomen is soft.  Musculoskeletal:     Comments: Mild swelling Bilateral  Skin:    General: Skin is warm and dry.  Neurological:      General: No focal deficit present.     Mental Status: She is alert and oriented to person,  place, and time.  Psychiatric:        Mood and Affect: Mood normal.        Thought Content: Thought content normal.        Judgment: Judgment normal.     Labs reviewed: Recent Labs    02/21/19 1721  02/28/19 0356  03/03/19 0408 03/04/19 0325 03/05/19 0359 03/12/19 06/24/19  NA 132*   < > 127*   < > 130* 130* 129* 131* 131*  K 3.3*   < > 4.5   < > 4.1 4.1 4.4 5.3 5.4*  CL 90*   < > 87*   < > 93* 92* 95*  --   --   CO2 30   < > 29   < > 30 29 25   --   --   GLUCOSE 98   < > 85   < > 137* 102* 84  --   --   BUN 18   < > 30*   < > 24* 20 21 20  35*  CREATININE 0.78   < > 0.94   < > 0.87 0.86 0.81 0.8 1.0  CALCIUM 8.9   < > 8.7*   < > 9.0 9.2 8.9  --   --   MG 1.5*   < > 1.9  --   --  1.9 1.9  --   --   PHOS 3.0  --   --   --   --   --   --   --   --    < > = values in this interval not displayed.   Recent Labs    08/21/18 08/25/18 09/25/18 02/21/19 1721 03/12/19  AST 45 32 18 54* 30  ALT 47 29 11 39 27  ALKPHOS 134 122 139* 161* 113  BILITOT 0.6 0.4  --  1.7*  --   PROT 6.2 6.1  --  8.4*  --   ALBUMIN 2.7 2.6  --  3.8  --    Recent Labs    08/09/18 1519  02/21/19 1721  03/01/19 0430 03/02/19 0433 03/04/19 0325 03/12/19  WBC 7.1   < > 7.5   < > 7.3 7.1 6.9 5.2  NEUTROABS 5.8  --  6.0  --   --   --   --   --   HGB 12.2   < > 12.9   < > 11.5* 11.6* 12.2 11.7*  HCT 36.5   < > 40.0   < > 35.3* 35.9* 39.1 35*  MCV 92.9   < > 99.8   < > 100.0 101.1* 102.1*  --   PLT 297   < > 175   < > 244 251 252 287   < > = values in this interval not displayed.   Lab Results  Component Value Date   TSH 1.964 02/21/2019   Lab Results  Component Value Date   HGBA1C 5.3 04/16/2017   Lab Results  Component Value Date   CHOL 188 04/12/2016   HDL 62 04/12/2016   LDLCALC 102 04/12/2016   TRIG 122 04/12/2016   CHOLHDL 3.0 Ratio 06/07/2008    Significant Diagnostic Results in last 30  days:  No results found.  Assessment/Plan Hyponatremia - Plan:  Per patient request she gets BMP every 2 weeks Her BMP today is 132 She is on sodium tablet   Chronic diastolic CHF (congestive heart failure) ([facility]) - Plan: She has gained almost 5 to 6 pounds We will repeat  her x-ray to follow-up her left pleural effusion  Essential hypertension - Plan:  Stable on Cozaar and Tenormin  PSVT (paroxysmal supraventricular tachycardia) ([facility]) - Plan:  On Tenormin  Neuropathy - Plan:  Stable on Neurontin  Iron deficiency - Plan: Hgb is stable Depression On Cymbalta    Family/ staff Communication:   Labs/tests ordered:  Repeat BMP and CBC   Total time spent in this patient care encounter was  25_  minutes; greater than 50% of the visit spent counseling patient and staff, reviewing records , Labs and coordinating care for problems addressed at this encounter.

## 2019-07-06 DIAGNOSIS — Z20828 Contact with and (suspected) exposure to other viral communicable diseases: Secondary | ICD-10-CM | POA: Diagnosis not present

## 2019-07-06 DIAGNOSIS — B342 Coronavirus infection, unspecified: Secondary | ICD-10-CM | POA: Diagnosis not present

## 2019-07-14 DIAGNOSIS — E876 Hypokalemia: Secondary | ICD-10-CM | POA: Diagnosis not present

## 2019-07-15 DIAGNOSIS — Z03818 Encounter for observation for suspected exposure to other biological agents ruled out: Secondary | ICD-10-CM | POA: Diagnosis not present

## 2019-07-21 DIAGNOSIS — E876 Hypokalemia: Secondary | ICD-10-CM | POA: Diagnosis not present

## 2019-07-21 LAB — BASIC METABOLIC PANEL
BUN: 31 — AB (ref 4–21)
Creatinine: 0.9 (ref 0.5–1.1)
Glucose: 78
Potassium: 5.1 (ref 3.4–5.3)
Sodium: 135 — AB (ref 137–147)

## 2019-07-22 ENCOUNTER — Non-Acute Institutional Stay: Payer: Medicare HMO | Admitting: Nurse Practitioner

## 2019-07-22 ENCOUNTER — Other Ambulatory Visit: Payer: Self-pay | Admitting: *Deleted

## 2019-07-22 ENCOUNTER — Encounter: Payer: Self-pay | Admitting: Nurse Practitioner

## 2019-07-22 DIAGNOSIS — K068 Other specified disorders of gingiva and edentulous alveolar ridge: Secondary | ICD-10-CM

## 2019-07-22 DIAGNOSIS — E871 Hypo-osmolality and hyponatremia: Secondary | ICD-10-CM | POA: Diagnosis not present

## 2019-07-22 DIAGNOSIS — Z1159 Encounter for screening for other viral diseases: Secondary | ICD-10-CM | POA: Diagnosis not present

## 2019-07-22 LAB — CHLORIDE
Calcium: 9.1
Carbon Dioxide, Total: 29
Chloride: 101
EGFR (Non-African Amer.): 52

## 2019-07-22 NOTE — Assessment & Plan Note (Signed)
Improved, last Na 135 07/21/19 continue NaCl tab.

## 2019-07-22 NOTE — Assessment & Plan Note (Signed)
No s/s of dental abscess presently, may consider ABT if s/s of infection develops, f/u dentist.

## 2019-07-22 NOTE — Progress Notes (Signed)
Location:  [facility] Room Number: [place] of Service:  [id number]) Provider:  Marlana Latus  NP  Virgie Dad, MD  Patient Care Team: Virgie Dad, MD as PCP - General (Internal Medicine) Lanelle Bal., MD as Referring Physician (Internal Medicine) Satira Sark, MD as Consulting Physician (Cardiology) Kyung Rudd, MD as Consulting Physician (Radiation Oncology) Brindy Higginbotham X, NP as Nurse Practitioner (Internal Medicine)  Extended Emergency Contact Information Primary Emergency Contact: Neita Garnet of [place] Phone: [phone] Relation: Son Secondary Emergency Contact: Gotts,Lance Address: [address]          [place], [place] [address] [name] of [place] Phone: [phone] Mobile Phone: [phone] Relation: Son  Code Status:  DNR Goals of care: Advanced Directive information Advanced Directives 07/22/2019  Does Patient Have a Medical Advance Directive? Yes  Type of Advance Directive Out of facility DNR (pink MOST or yellow form)  Does patient want to make changes to medical advance directive? No - Patient declined  Copy of [place] in Chart? -  Would patient like information on creating a medical advance directive? -  Pre-existing out of facility DNR order (yellow form or pink MOST form) Yellow form placed in chart (order not valid for inpatient use)     Chief Complaint  Patient presents with  . Acute Visit    C/o - mouth lesion    HPI:  Pt is a 85 y.o. female seen today for an acute visit for right lower gum pain, the last tooth area, alveolar bone exposed through gum, pain when touched, otherwise no swelling, redness, warmth, or drainage. Duration and onset are not certain. Hx of hyponatremia, last Na 135 07/21/19.     Past Medical History:  Diagnosis Date  . Allergic rhinitis   . Anemia    NOS  . Anxiety   . Biceps tendon rupture    Bilateral  . Breast cancer ([place]) 2001    Left s/p lumpectomy and XRT   . Bronchiectasis ([place]) 2014   Noted on chest x-ray  . Complication of anesthesia    slow to wake up  . Cough 2014  . Depression   . Diverticulosis   . Elevated liver enzymes    Biopsy consistent with steatohepatitis  . Fundic gland polyps of stomach, benign   . GERD (gastroesophageal reflux disease) 06/2007   EGD Dr Gala Romney >sm HH, multiple fundic gland polyps  . Glaucoma 2013   both eyes  . Hemorrhoids   . Hiatal hernia   . Hx of radiation therapy 07/17/10 to 07/21/10   SRS LLL lung  . Hyperlipidemia   . Insomnia   . Low back pain    scoliosis  . Lung cancer ([facility]) 05/2010   Left 2011 - rad x 3  . Lymphedema    Left arm  . Osteoarthritis   . Osteopenia 10/25/2016  . Osteoporosis, senile   . Overactive bladder   . Pneumonia    RLL with sepsis  . PSVT (paroxysmal supraventricular tachycardia) ([facility] [place])   . Rheumatic fever    Age 68  . Right thyroid nodule   . Steatohepatitis    liver bx  . Type 2 diabetes mellitus ([facility])    Past Surgical History:  Procedure Laterality Date  . ABDOMINAL HYSTERECTOMY  1964  . APPENDECTOMY  1964  . BIOPSY THYROID  11/2008  . BREAST BIOPSY     X 3 w/ cystectomy  . BREAST  LUMPECTOMY  2001  . CATARACT EXTRACTION Bilateral 2003   Lens implant Dr. Charise Killian  . CHOLECYSTECTOMY  1965  . COLONOSCOPY  09/11/2011   Procedure: COLONOSCOPY;  Surgeon: Dorothyann Peng, MD;  [facility];  Service: Endoscopy;  Laterality: N/A;  . COLONOSCOPY  2005 /2012   Dr. Lucianne Muss- diverticulosis, ext hemorrhoids.  . COLONOSCOPY WITH PROPOFOL N/A 11/09/2017   Procedure: COLONOSCOPY WITH PROPOFOL;  Surgeon: Mauri Pole, MD;  Location: [facility];  Service: Endoscopy;  Laterality: N/A;  . COLONOSCOPY WITH PROPOFOL N/A 11/12/2017   Procedure: COLONOSCOPY WITH PROPOFOL;  Surgeon: Ladene Artist, MD;  Location: [facility];  Service: Endoscopy;  Laterality: N/A;  . CYSTOSCOPY  2007  . ESOPHAGOGASTRODUODENOSCOPY  09/11/2011    Procedure: ESOPHAGOGASTRODUODENOSCOPY (EGD);  Surgeon: Dorothyann Peng, MD;  [facility];  Service: Endoscopy;  Laterality: N/A;  . ESOPHAGOGASTRODUODENOSCOPY (EGD) WITH PROPOFOL N/A 11/08/2017   Procedure: ESOPHAGOGASTRODUODENOSCOPY (EGD) WITH PROPOFOL;  Surgeon: Mauri Pole, MD;  Location: [facility];  Service: Endoscopy;  Laterality: N/A;  . LIVER BIOPSY  2008  . LUNG BIOPSY Left 06/06/2010  . REVISION TOTAL HIP ARTHROPLASTY  2007   Left  . SKIN CANCER EXCISION     nose and left lower cheek  . TONSILLECTOMY  1930  . TOTAL HIP ARTHROPLASTY Bilateral 2005 & 2007    Dr. Earl Lagos. Aline Brochure     Allergies  Allergen Reactions  . Augmentin [Amoxicillin-Pot Clavulanate] Other (See Comments)    unknown  . Ibuprofen Hives  . Naproxen Other (See Comments)    unknown  . Statins Other (See Comments)    Feels like knives in stomach  . Surgilube [Gyne-Moistrin]     Rectal itching, burning  . Tape Rash    Outpatient Encounter Medications as of 07/22/2019  Medication Sig  . acetaminophen (TYLENOL) 500 MG tablet Take 500 mg by mouth 2 (two) times daily.   Marland Kitchen atenolol (TENORMIN) 25 MG tablet Take 1 tablet (25 mg total) by mouth daily.  . B Complex Vitamins (B COMPLEX-B12) TABS Take 1 tablet by mouth daily.  . DULoxetine (CYMBALTA) 30 MG capsule Take 1 capsule (30 mg total) by mouth daily.  . ferrous gluconate (FERGON) 240 (27 FE) MG tablet Take 1 tablet (240 mg total) by mouth daily.  . folic acid (FOLVITE) 544 MCG tablet Take 1 tablet (400 mcg total) by mouth daily.  Marland Kitchen gabapentin (NEURONTIN) 100 MG capsule Take 1 capsule (100 mg total) by mouth 2 (two) times daily.  Marland Kitchen latanoprost (XALATAN) 0.005 % ophthalmic solution Place 1 drop into both eyes at bedtime.  Marland Kitchen losartan (COZAAR) 50 MG tablet Take 1 tablet (50 mg total) by mouth daily.  . magnesium oxide (MAG-OX) 400 (241.3 Mg) MG tablet Take 1 tablet (400 mg total) by mouth 2 (two) times daily.  . Multiple Vitamins-Minerals  (PRESERVISION AREDS 2) CAPS Take 1 capsule by mouth 2 (two) times daily.  . Nutritional Supplements (FEEDING SUPPLEMENT, BOOST BREEZE,) LIQD Take 237 mLs by mouth 2 (two) times daily.  Marland Kitchen omeprazole (PRILOSEC) 20 MG capsule Take 1 capsule (20 mg total) by mouth daily.  . simethicone (MYLICON) 80 MG chewable tablet Chew 80 mg by mouth 4 (four) times daily as needed for flatulence.  . sodium chloride 1 g tablet Take 1 g by mouth daily.  . Simethicone 80 MG TABS Take 1 tablet (80 mg total) by mouth 4 (four) times daily as needed for up to 30 days (indigestion).  . [DISCONTINUED]  Sodium Fluoride (PREVIDENT 5000 BOOSTER PLUS) 1.1 % PSTE Apply to teeth nightly   No facility-administered encounter medications on file as of 07/22/2019.     Review of Systems  Constitutional: Negative for activity change, appetite change, chills, diaphoresis, fatigue, fever and unexpected weight change.  HENT: Positive for dental problem, hearing loss and mouth sores. Negative for congestion, drooling, ear discharge, ear pain, facial swelling, nosebleeds, postnasal drip, rhinorrhea, sinus pressure, sinus pain, sneezing, sore throat, tinnitus, trouble swallowing and voice change.        Gum pain.   Respiratory: Negative for cough, shortness of breath and wheezing.   Cardiovascular: Positive for leg swelling. Negative for chest pain.  Musculoskeletal: Positive for arthralgias and gait problem.  Skin: Negative for color change and pallor.  Neurological: Negative for dizziness, speech difficulty, weakness and headaches.  Psychiatric/Behavioral: Negative for agitation, behavioral problems, hallucinations and sleep disturbance. The patient is not nervous/anxious.     Immunization History  Administered Date(s) Administered  . Influenza Whole 09/22/2008, 09/16/2012, 09/30/2013  . Influenza, High Dose Seasonal PF 09/25/2017  . Influenza,inj,Quad PF,6+ Mos 10/09/2018  . Influenza-Unspecified 10/02/2014, 09/15/2015, 09/27/2016   . Pneumococcal Conjugate-13 11/29/2017  . Pneumococcal Polysaccharide-23 08/17/2006  . Td 12/17/2005   Pertinent  Health Maintenance Due  Topic Date Due  . FOOT EXAM  07/19/1931  . HEMOGLOBIN A1C  10/17/2017  . INFLUENZA VACCINE  07/18/2019  . OPHTHALMOLOGY EXAM  12/27/2019  . DEXA SCAN  Completed  . PNA vac Low Risk Adult  Completed   Fall Risk  10/07/2018 09/19/2017 05/17/2016 04/20/2016 04/19/2016  Falls in the past year? Yes No No No No  Number falls in past yr: 1 - - - -  Injury with Fall? No - - - -   Functional Status Survey:    Vitals:   07/22/19 1108  BP: 116/62  Pulse: 64  Resp: 18  Temp: 98.2 F (36.8 C)  SpO2: 90%  Weight: 131 lb (59.4 kg)  Height: 5' 4"  (1.626 m)   Body mass index is 22.49 kg/m. Physical Exam Vitals signs and nursing note reviewed.  Constitutional:      General: She is not in acute distress.    Appearance: Normal appearance. She is normal weight. She is not ill-appearing, toxic-appearing or diaphoretic.  HENT:     Head: Normocephalic and atraumatic.     Right Ear: External ear normal.     Left Ear: External ear normal.     Nose: Nose normal. No congestion or rhinorrhea.     Mouth/Throat:     Mouth: Mucous membranes are moist.     Dentition: Dental tenderness and gum lesions present. No gingival swelling or dental abscesses.     Comments: The right lower last tooth is missing, lingual aspect gum has alveolar bone exposed through the gum, pain when touched, no warmth, drainage, redness, ulceration in the area.   Eyes:     Extraocular Movements: Extraocular movements intact.     Conjunctiva/sclera: Conjunctivae normal.     Pupils: Pupils are equal, round, and reactive to light.  Neck:     Musculoskeletal: Normal range of motion and neck supple. No neck rigidity or muscular tenderness.  Cardiovascular:     Rate and Rhythm: Normal rate.     Heart sounds: No murmur.  Pulmonary:     Breath sounds: No wheezing, rhonchi or rales.  Chest:      Chest wall: No tenderness.  Musculoskeletal:     Right lower leg: Edema present.  Left lower leg: Edema present.     Comments: Trace edema BLE  Lymphadenopathy:     Cervical: No cervical adenopathy.  Skin:    General: Skin is warm and dry.     Findings: No erythema.  Neurological:     General: No focal deficit present.     Mental Status: She is alert and oriented to person, place, and time. Mental status is at baseline.  Psychiatric:        Mood and Affect: Mood normal.        Behavior: Behavior normal.        Thought Content: Thought content normal.        Judgment: Judgment normal.     Labs reviewed: Recent Labs    02/21/19 1721  02/28/19 0356  03/03/19 0408 03/04/19 0325 03/05/19 0359 03/12/19 06/24/19 07/21/19  NA 132*   < > 127*   < > 130* 130* 129* 131* 131* 135*  K 3.3*   < > 4.5   < > 4.1 4.1 4.4 5.3 5.4* 5.1  CL 90*   < > 87*   < > 93* 92* 95*  --   --  101  CO2 30   < > 29   < > 30 29 25   --   --  29  GLUCOSE 98   < > 85   < > 137* 102* 84  --   --   --   BUN 18   < > 30*   < > 24* 20 21 20  35* 31*  CREATININE 0.78   < > 0.94   < > 0.87 0.86 0.81 0.8 1.0 0.9  CALCIUM 8.9   < > 8.7*   < > 9.0 9.2 8.9  --   --  9.1  MG 1.5*   < > 1.9  --   --  1.9 1.9  --   --   --   PHOS 3.0  --   --   --   --   --   --   --   --   --    < > = values in this interval not displayed.   Recent Labs    08/21/18 08/25/18 09/25/18 02/21/19 1721 03/12/19  AST 45 32 18 54* 30  ALT 47 29 11 39 27  ALKPHOS 134 122 139* 161* 113  BILITOT 0.6 0.4  --  1.7*  --   PROT 6.2 6.1  --  8.4*  --   ALBUMIN 2.7 2.6  --  3.8  --    Recent Labs    08/09/18 1519  02/21/19 1721  03/01/19 0430 03/02/19 0433 03/04/19 0325 03/12/19  WBC 7.1   < > 7.5   < > 7.3 7.1 6.9 5.2  NEUTROABS 5.8  --  6.0  --   --   --   --   --   HGB 12.2   < > 12.9   < > 11.5* 11.6* 12.2 11.7*  HCT 36.5   < > 40.0   < > 35.3* 35.9* 39.1 35*  MCV 92.9   < > 99.8   < > 100.0 101.1* 102.1*  --   PLT 297   < > 175    < > 244 251 252 287   < > = values in this interval not displayed.   Lab Results  Component Value Date   TSH 1.964 02/21/2019   Lab Results  Component Value Date   HGBA1C  5.3 04/16/2017   Lab Results  Component Value Date   CHOL 188 04/12/2016   HDL 62 04/12/2016   LDLCALC 102 04/12/2016   TRIG 122 04/12/2016   CHOLHDL 3.0 Ratio 06/07/2008    Significant Diagnostic Results in last 30 days:  No results found.  Assessment/Plan Pain in gums No s/s of dental abscess presently, may consider ABT if s/s of infection develops, f/u dentist.   Hyponatremia Improved, last Na 135 07/21/19 continue NaCl tab.      Family/ staff Communication: plan of care reviewed with the patient and charge nurse.   Labs/tests ordered:  none  Time spend 40 minutes.

## 2019-07-28 DIAGNOSIS — I270 Primary pulmonary hypertension: Secondary | ICD-10-CM | POA: Diagnosis not present

## 2019-07-28 LAB — BASIC METABOLIC PANEL
BUN: 30 — AB (ref 4–21)
Creatinine: 1 (ref 0.5–1.1)
Glucose: 78
Potassium: 5.4 — AB (ref 3.4–5.3)
Sodium: 134 — AB (ref 137–147)

## 2019-07-29 ENCOUNTER — Encounter: Payer: Self-pay | Admitting: Nurse Practitioner

## 2019-07-29 DIAGNOSIS — N183 Chronic kidney disease, stage 3 unspecified: Secondary | ICD-10-CM | POA: Insufficient documentation

## 2019-08-17 DIAGNOSIS — R69 Illness, unspecified: Secondary | ICD-10-CM | POA: Diagnosis not present

## 2019-08-25 DIAGNOSIS — L814 Other melanin hyperpigmentation: Secondary | ICD-10-CM | POA: Diagnosis not present

## 2019-08-25 DIAGNOSIS — L218 Other seborrheic dermatitis: Secondary | ICD-10-CM | POA: Diagnosis not present

## 2019-08-25 DIAGNOSIS — D1801 Hemangioma of skin and subcutaneous tissue: Secondary | ICD-10-CM | POA: Diagnosis not present

## 2019-08-25 DIAGNOSIS — I270 Primary pulmonary hypertension: Secondary | ICD-10-CM | POA: Diagnosis not present

## 2019-08-25 DIAGNOSIS — E876 Hypokalemia: Secondary | ICD-10-CM | POA: Diagnosis not present

## 2019-08-25 DIAGNOSIS — L570 Actinic keratosis: Secondary | ICD-10-CM | POA: Diagnosis not present

## 2019-08-25 DIAGNOSIS — L821 Other seborrheic keratosis: Secondary | ICD-10-CM | POA: Diagnosis not present

## 2019-08-25 LAB — BASIC METABOLIC PANEL WITH GFR
BUN: 31 — AB (ref 4–21)
Creatinine: 1 (ref 0.5–1.1)
Glucose: 78
Potassium: 5 (ref 3.4–5.3)
Sodium: 131 — AB (ref 137–147)

## 2019-09-01 DIAGNOSIS — R6889 Other general symptoms and signs: Secondary | ICD-10-CM | POA: Diagnosis not present

## 2019-09-24 DIAGNOSIS — E876 Hypokalemia: Secondary | ICD-10-CM | POA: Diagnosis not present

## 2019-09-24 LAB — BASIC METABOLIC PANEL WITH GFR
BUN: 36 — AB (ref 4–21)
Creatinine: 1 (ref 0.5–1.1)
Glucose: 76
Potassium: 5.1 (ref 3.4–5.3)
Sodium: 132 — AB (ref 137–147)

## 2019-09-28 ENCOUNTER — Non-Acute Institutional Stay: Payer: Medicare HMO | Admitting: Nurse Practitioner

## 2019-09-28 ENCOUNTER — Encounter: Payer: Self-pay | Admitting: Nurse Practitioner

## 2019-09-28 DIAGNOSIS — G629 Polyneuropathy, unspecified: Secondary | ICD-10-CM

## 2019-09-28 DIAGNOSIS — K219 Gastro-esophageal reflux disease without esophagitis: Secondary | ICD-10-CM

## 2019-09-28 DIAGNOSIS — I480 Paroxysmal atrial fibrillation: Secondary | ICD-10-CM | POA: Diagnosis not present

## 2019-09-28 DIAGNOSIS — M159 Polyosteoarthritis, unspecified: Secondary | ICD-10-CM

## 2019-09-28 DIAGNOSIS — I10 Essential (primary) hypertension: Secondary | ICD-10-CM | POA: Diagnosis not present

## 2019-09-28 DIAGNOSIS — E871 Hypo-osmolality and hyponatremia: Secondary | ICD-10-CM

## 2019-09-28 DIAGNOSIS — N318 Other neuromuscular dysfunction of bladder: Secondary | ICD-10-CM | POA: Diagnosis not present

## 2019-09-28 DIAGNOSIS — D500 Iron deficiency anemia secondary to blood loss (chronic): Secondary | ICD-10-CM

## 2019-09-28 DIAGNOSIS — F418 Other specified anxiety disorders: Secondary | ICD-10-CM

## 2019-09-28 DIAGNOSIS — R69 Illness, unspecified: Secondary | ICD-10-CM | POA: Diagnosis not present

## 2019-09-28 LAB — CHLORIDE
Calcium: 8.7
Calcium: 8.8
Calcium: 9.2
Carbon Dioxide, Total: 27
Carbon Dioxide, Total: 28
Carbon Dioxide, Total: 28
Chloride: 100
Chloride: 97
Chloride: 99

## 2019-09-28 NOTE — Assessment & Plan Note (Signed)
Stable, continue Tylenol 571m bid.

## 2019-09-28 NOTE — Assessment & Plan Note (Signed)
Her mood is stable, continue Cymbalta 43m qd.

## 2019-09-28 NOTE — Progress Notes (Signed)
Location:   [facility] Room Number: 580 Place of Service:  ALF (13) Provider:  Antonia Jicha NP  Virgie Dad, MD  Patient Care Team: Virgie Dad, MD as PCP - General (Internal Medicine) Lannette Donath Glo Herring., MD as Referring Physician (Internal Medicine) Satira Sark, MD as Consulting Physician (Cardiology) Kyung Rudd, MD as Consulting Physician (Radiation Oncology) Arelie Kuzel X, NP as Nurse Practitioner (Internal Medicine)  Extended Emergency Contact Information Primary Emergency Contact: Neita Garnet of [place] Phone: [phone] Relation: Son Secondary Emergency Contact: Salvucci,Lance Address: [address]          [place],  [address] [name] of [place] Phone: [phone] Mobile Phone: [phone] Relation: Son  Code Status:  DNR Goals of care: Advanced Directive information Advanced Directives 09/28/2019  Does Patient Have a Medical Advance Directive? Yes  Type of Advance Directive Out of facility DNR (pink MOST or yellow form)  Does patient want to make changes to medical advance directive? No - Patient declined  Copy of [place] in Chart? -  Would patient like information on creating a medical advance directive? -  Pre-existing out of facility DNR order (yellow form or pink MOST form) Pink MOST form placed in chart (order not valid for inpatient use)     Chief Complaint  Patient presents with   Medical Management of Chronic Issues   Health Maintenance    Foot exam, influenza vaccine and hemoglobin A1c    HPI:  Pt is a 85 y.o. female seen today for medical management of chronic diseases.    The patient resides in [facility] for safety, care assistance, w/c for mobility. Hx of hyponatremia, last Na 132 09/24/19, on NaCl tab 1gm qd. OA, stable, taking Tylenol 553m bid. HTN/Afib, blood pressure, heart rate  is controlled, on Atenolol 22mqd. Her mood is stable, on Cymbalta 3069md. Anemia, stable,  03/12/19 Hgb 11.42.6n Fe dily, Folic acid [id number]. Peripheral neuropathy, stable, on Gabapentin 100m23md. GERD, stable on Omeprazole 20mg60m    Past Medical History:  Diagnosis Date   Allergic rhinitis    Anemia    NOS   Anxiety    Biceps tendon rupture    Bilateral   Breast cancer (HCC) [email or web address]   Left s/p lumpectomy and XRT    Bronchiectasis (HCC) [email or web address]   Noted on chest x-ray   Complication of anesthesia    slow to wake up   Cough 2014   Depression    Diverticulosis    Elevated liver enzymes    Biopsy consistent with steatohepatitis   Fundic gland polyps of stomach, benign    GERD (gastroesophageal reflux disease) 06/2007   EGD Dr RourkGala [facility], mAlbiontiple fundic gland polyps   Glaucoma 2013   both eyes   Hemorrhoids    Hiatal hernia    Hx of radiation therapy 07/17/10 to 07/21/10   SRS LLL lung   Hyperlipidemia    Insomnia    Low back pain    scoliosis   Lung cancer (HCC) [id number]   Left 2011 - rad x 3   Lymphedema    Left arm   Osteoarthritis    Osteopenia 10/25/2016   Osteoporosis, senile    Overactive bladder    Pneumonia    RLL with sepsis   PSVT (paroxysmal supraventricular tachycardia) (HCC)    Rheumatic fever    Age 74   Right thyroid nodule    Steatohepatitis  liver bx   Type 2 diabetes mellitus ([place])    Past Surgical History:  Procedure Laterality Date   ABDOMINAL HYSTERECTOMY  1964   APPENDECTOMY  1964   BIOPSY THYROID  11/2008   BREAST BIOPSY     X 3 w/ cystectomy   BREAST LUMPECTOMY  2001   CATARACT EXTRACTION Bilateral 2003   Lens implant Dr. Charise Killian   CHOLECYSTECTOMY  1965   COLONOSCOPY  09/11/2011   Procedure: COLONOSCOPY;  Surgeon: Dorothyann Peng, MD;  [facility];  Service: Endoscopy;  Laterality: N/A;   COLONOSCOPY  2005 /2012   Dr. Lucianne Muss- diverticulosis, ext hemorrhoids.   COLONOSCOPY WITH PROPOFOL N/A 11/09/2017   Procedure: COLONOSCOPY WITH PROPOFOL;  Surgeon: Mauri Pole, MD;   Location: [facility];  Service: Endoscopy;  Laterality: N/A;   COLONOSCOPY WITH PROPOFOL N/A 11/12/2017   Procedure: COLONOSCOPY WITH PROPOFOL;  Surgeon: Ladene Artist, MD;  Location: [facility];  Service: Endoscopy;  Laterality: N/A;   CYSTOSCOPY  2007   ESOPHAGOGASTRODUODENOSCOPY  09/11/2011   Procedure: ESOPHAGOGASTRODUODENOSCOPY (EGD);  Surgeon: Dorothyann Peng, MD;  [facility];  Service: Endoscopy;  Laterality: N/A;   ESOPHAGOGASTRODUODENOSCOPY (EGD) WITH PROPOFOL N/A 11/08/2017   Procedure: ESOPHAGOGASTRODUODENOSCOPY (EGD) WITH PROPOFOL;  Surgeon: Mauri Pole, MD;  [facility];  Service: Endoscopy;  Laterality: N/A;   LIVER BIOPSY  2008   LUNG BIOPSY Left 06/06/2010   REVISION TOTAL HIP ARTHROPLASTY  2007   Left   SKIN CANCER EXCISION     nose and left lower cheek   TONSILLECTOMY  1930   TOTAL HIP ARTHROPLASTY Bilateral 2005 & 2007    Dr. Earl Lagos. Aline Brochure     Allergies  Allergen Reactions   Augmentin [Amoxicillin-Pot Clavulanate] Other (See Comments)    unknown   Ibuprofen Hives   Naproxen Other (See Comments)    unknown   Statins Other (See Comments)    Feels like knives in stomach   Surgilube [Gyne-Moistrin]     Rectal itching, burning   Tape Rash    Allergies as of 09/28/2019      Reactions   Augmentin [amoxicillin-pot Clavulanate] Other (See Comments)   unknown   Ibuprofen Hives   Naproxen Other (See Comments)   unknown   Statins Other (See Comments)   Feels like knives in stomach   Surgilube [gyne-moistrin]    Rectal itching, burning   Tape Rash      Medication List       Accurate as of September 28, 2019  4:00 PM. If you have any questions, ask your nurse or doctor.        STOP taking these medications   losartan 50 MG tablet Commonly known as: COZAAR Stopped by: Kelee Cunningham X Jameire Kouba, NP     TAKE these medications   acetaminophen 500 MG tablet Commonly known as: TYLENOL Take 500 mg by mouth 2 (two)  times daily.   atenolol 25 MG tablet Commonly known as: TENORMIN Take 1 tablet (25 mg total) by mouth daily.   B Complex-B12 Tabs Take 1 tablet by mouth daily.   diphenhydrAMINE 25 mg capsule Commonly known as: BENADRYL Take 25 mg by mouth. Once a day as needed   DULoxetine 30 MG capsule Commonly known as: CYMBALTA Take 1 capsule (30 mg total) by mouth daily.   feeding supplement (BOOST BREEZE) Liqd Take 237 mLs by mouth 2 (two) times daily.   ferrous gluconate 240 (27 FE) MG tablet Commonly known as: [employer]  Take 1 tablet (240 mg total) by mouth daily.   folic acid 588 MCG tablet Commonly known as: FOLVITE Take 1 tablet (400 mcg total) by mouth daily.   gabapentin 100 MG capsule Commonly known as: NEURONTIN Take 1 capsule (100 mg total) by mouth 2 (two) times daily.   Imvexxy Maintenance Pack 4 MCG Inst Generic drug: Estradiol Place vaginally. Once A Day on Thu   latanoprost 0.005 % ophthalmic solution Commonly known as: XALATAN Place 1 drop into both eyes at bedtime.   magnesium oxide 400 (241.3 Mg) MG tablet Commonly known as: MAG-OX Take 1 tablet (400 mg total) by mouth 2 (two) times daily.   omeprazole 20 MG capsule Commonly known as: PRILOSEC Take 1 capsule (20 mg total) by mouth daily.   PreserVision AREDS 2 Caps Take 1 capsule by mouth 2 (two) times daily.   simethicone 80 MG chewable tablet Commonly known as: MYLICON Chew 80 mg by mouth 4 (four) times daily as needed for flatulence.   Simethicone 80 MG Tabs Take 1 tablet (80 mg total) by mouth 4 (four) times daily as needed for up to 30 days (indigestion).   sodium chloride 1 g tablet Take 1 g by mouth daily.       Review of Systems  Constitutional: Negative for activity change, appetite change, chills, diaphoresis, fatigue, fever and unexpected weight change.  HENT: Positive for hearing loss. Negative for congestion and voice change.   Respiratory: Negative for cough, shortness of breath and  wheezing.   Cardiovascular: Positive for leg swelling.  Gastrointestinal: Negative for abdominal distention, abdominal pain, constipation, diarrhea, nausea and vomiting.  Genitourinary: Positive for frequency. Negative for difficulty urinating and dysuria.       3-4x/night, not interfere her night sleep.   Musculoskeletal: Positive for arthralgias and gait problem.  Skin: Negative for color change.  Neurological: Negative for dizziness, speech difficulty, weakness and headaches.  Psychiatric/Behavioral: Negative for agitation, behavioral problems, confusion, hallucinations and sleep disturbance. The patient is not nervous/anxious.     Immunization History  Administered Date(s) Administered   Influenza Whole 09/22/2008, 09/16/2012, 09/30/2013   Influenza, High Dose Seasonal PF 09/25/2017   Influenza,inj,Quad PF,6+ Mos 10/09/2018   Influenza-Unspecified 10/02/2014, 09/15/2015, 09/27/2016   Pneumococcal Conjugate-13 11/29/2017   Pneumococcal Polysaccharide-23 08/17/2006   Td 12/17/2005   Pertinent  Health Maintenance Due  Topic Date Due   FOOT EXAM  07/19/1931   URINE MICROALBUMIN  04/30/2008   HEMOGLOBIN A1C  10/17/2017   INFLUENZA VACCINE  07/18/2019   OPHTHALMOLOGY EXAM  12/27/2019   DEXA SCAN  Completed   PNA vac Low Risk Adult  Completed   Fall Risk  10/07/2018 09/19/2017 05/17/2016 04/20/2016 04/19/2016  Falls in the past year? Yes No No No No  Number falls in past yr: 1 - - - -  Injury with Fall? No - - - -   Functional Status Survey:    Vitals:   09/28/19 0933  BP: 130/72  Pulse: 64  Resp: 20  Temp: 97.7 F (36.5 C)  SpO2: 95%  Weight: 133 lb 3.2 oz (60.4 kg)  Height: 5' 4"  (1.626 m)   Body mass index is 22.86 kg/m. Physical Exam Vitals signs and nursing note reviewed.  Constitutional:      General: She is not in acute distress.    Appearance: Normal appearance. She is normal weight. She is not ill-appearing, toxic-appearing or diaphoretic.  HENT:       Head: Normocephalic and atraumatic.  Nose: Nose normal.     Mouth/Throat:     Mouth: Mucous membranes are moist.  Eyes:     Extraocular Movements: Extraocular movements intact.     Conjunctiva/sclera: Conjunctivae normal.     Pupils: Pupils are equal, round, and reactive to light.  Neck:     Musculoskeletal: Normal range of motion and neck supple.  Cardiovascular:     Rate and Rhythm: Normal rate and regular rhythm.     Heart sounds: No murmur.  Pulmonary:     Breath sounds: No wheezing, rhonchi or rales.  Abdominal:     General: Bowel sounds are normal. There is no distension.     Tenderness: There is no abdominal tenderness. There is no right CVA tenderness, left CVA tenderness, guarding or rebound.  Musculoskeletal:     Right lower leg: Edema present.     Left lower leg: Edema present.     Comments: Trace edema BLE.   Skin:    General: Skin is warm and dry.  Neurological:     General: No focal deficit present.     Mental Status: She is alert and oriented to person, place, and time. Mental status is at baseline.     Cranial Nerves: No cranial nerve deficit.     Motor: No weakness.     Coordination: Coordination normal.     Gait: Gait abnormal.  Psychiatric:        Mood and Affect: Mood normal.        Behavior: Behavior normal.        Thought Content: Thought content normal.        Judgment: Judgment normal.     Labs reviewed: Recent Labs    02/21/19 1721  02/28/19 0356  03/03/19 0408 03/04/19 0325 03/05/19 0359  07/28/19 08/25/19 09/24/19  NA 132*   < > 127*   < > 130* 130* 129*   < > 134* 131* 132*  K 3.3*   < > 4.5   < > 4.1 4.1 4.4   < > 5.4* 5.0 5.1  CL 90*   < > 87*   < > 93* 92* 95*   < > 100 99 97  CO2 30   < > 29   < > 30 29 25    < > 28 28 27   GLUCOSE 98   < > 85   < > 137* 102* 84  --   --   --   --   BUN 18   < > 30*   < > 24* 20 21   < > 30* 31* 36*  CREATININE 0.78   < > 0.94   < > 0.87 0.86 0.81   < > 1.0 1.0 1.0  CALCIUM 8.9   < > 8.7*   <  > 9.0 9.2 8.9   < > 9.2 8.7 8.8  MG 1.5*   < > 1.9  --   --  1.9 1.9  --   --   --   --   PHOS 3.0  --   --   --   --   --   --   --   --   --   --    < > = values in this interval not displayed.   Recent Labs    02/21/19 1721 03/12/19  AST 54* 30  ALT 39 27  ALKPHOS 161* 113  BILITOT 1.7*  --   PROT 8.4*  --   ALBUMIN  3.8  --    Recent Labs    02/21/19 1721  03/01/19 0430 03/02/19 0433 03/04/19 0325 03/12/19  WBC 7.5   < > 7.3 7.1 6.9 5.2  NEUTROABS 6.0  --   --   --   --   --   HGB 12.9   < > 11.5* 11.6* 12.2 11.7*  HCT 40.0   < > 35.3* 35.9* 39.1 35*  MCV 99.8   < > 100.0 101.1* 102.1*  --   PLT 175   < > 244 251 252 287   < > = values in this interval not displayed.   Lab Results  Component Value Date   TSH 1.964 02/21/2019   Lab Results  Component Value Date   HGBA1C 5.3 04/16/2017   Lab Results  Component Value Date   CHOL 188 04/12/2016   HDL 62 04/12/2016   LDLCALC 102 04/12/2016   TRIG 122 04/12/2016   CHOLHDL 3.0 Ratio 06/07/2008    Significant Diagnostic Results in last 30 days:  No results found.  Assessment/Plan Paroxysmal atrial fibrillation (HCC) Heart rate is in control, continue Atenolol 5m qd.   HTN (hypertension) Blood pressure is controlled, continue Atenolol 262mqd.   GERD (gastroesophageal reflux disease) Stable, continue Omeprazole 2052md.   Neuropathy Stable, continue Gabapentin 100m72md.   Osteoarthritis Stable, continue Tylenol 500mg3m.   OVERACTIVE BLADDER Persists, 3-4x/night, not interfere night sleep.   Hyponatremia Stable, serum Na 130s, continue NaCl 1gm qd.   Depression with anxiety Her mood is stable, continue Cymbalta 30mg 33m  Blood loss anemia Stable, last Hgb 11.7 02/2019, no active bleed, continue daily Fe, Folic acid supplement.      Family/ staff Communication: plan of care reviewed with the patient and charge nurse.   Labs/tests ordered:  none  Time spend 40 minutes.

## 2019-09-28 NOTE — Assessment & Plan Note (Signed)
Stable, continue Gabapentin 151m bid.

## 2019-09-28 NOTE — Assessment & Plan Note (Signed)
Stable, serum Na 130s, continue NaCl 1gm qd.

## 2019-09-28 NOTE — Assessment & Plan Note (Signed)
Persists, 3-4x/night, not interfere night sleep.

## 2019-09-28 NOTE — Assessment & Plan Note (Signed)
Stable, last Hgb 11.7 02/2019, no active bleed, continue daily Fe, Folic acid supplement.

## 2019-09-28 NOTE — Assessment & Plan Note (Signed)
Stable, continue Omeprazole 74m qd.

## 2019-09-28 NOTE — Assessment & Plan Note (Signed)
Heart rate is in control, continue Atenolol 11m qd.

## 2019-09-28 NOTE — Assessment & Plan Note (Signed)
Blood pressure is controlled, continue Atenolol 39m qd.

## 2019-10-20 DIAGNOSIS — Z20828 Contact with and (suspected) exposure to other viral communicable diseases: Secondary | ICD-10-CM | POA: Diagnosis not present

## 2019-10-27 DIAGNOSIS — Z20828 Contact with and (suspected) exposure to other viral communicable diseases: Secondary | ICD-10-CM | POA: Diagnosis not present

## 2019-11-02 DIAGNOSIS — H401111 Primary open-angle glaucoma, right eye, mild stage: Secondary | ICD-10-CM | POA: Diagnosis not present

## 2019-11-02 DIAGNOSIS — H401122 Primary open-angle glaucoma, left eye, moderate stage: Secondary | ICD-10-CM | POA: Diagnosis not present

## 2019-11-03 DIAGNOSIS — Z03818 Encounter for observation for suspected exposure to other biological agents ruled out: Secondary | ICD-10-CM | POA: Diagnosis not present

## 2019-11-03 DIAGNOSIS — I10 Essential (primary) hypertension: Secondary | ICD-10-CM | POA: Diagnosis not present

## 2019-11-05 IMAGING — CR DG CHEST 2V
2 series · 2 of 2 positions shown · non-contrast
Comparison: 06/14/2015

CLINICAL DATA: Weakness and shortness of breath

EXAM:
CHEST  2 VIEW

[w chest lat]
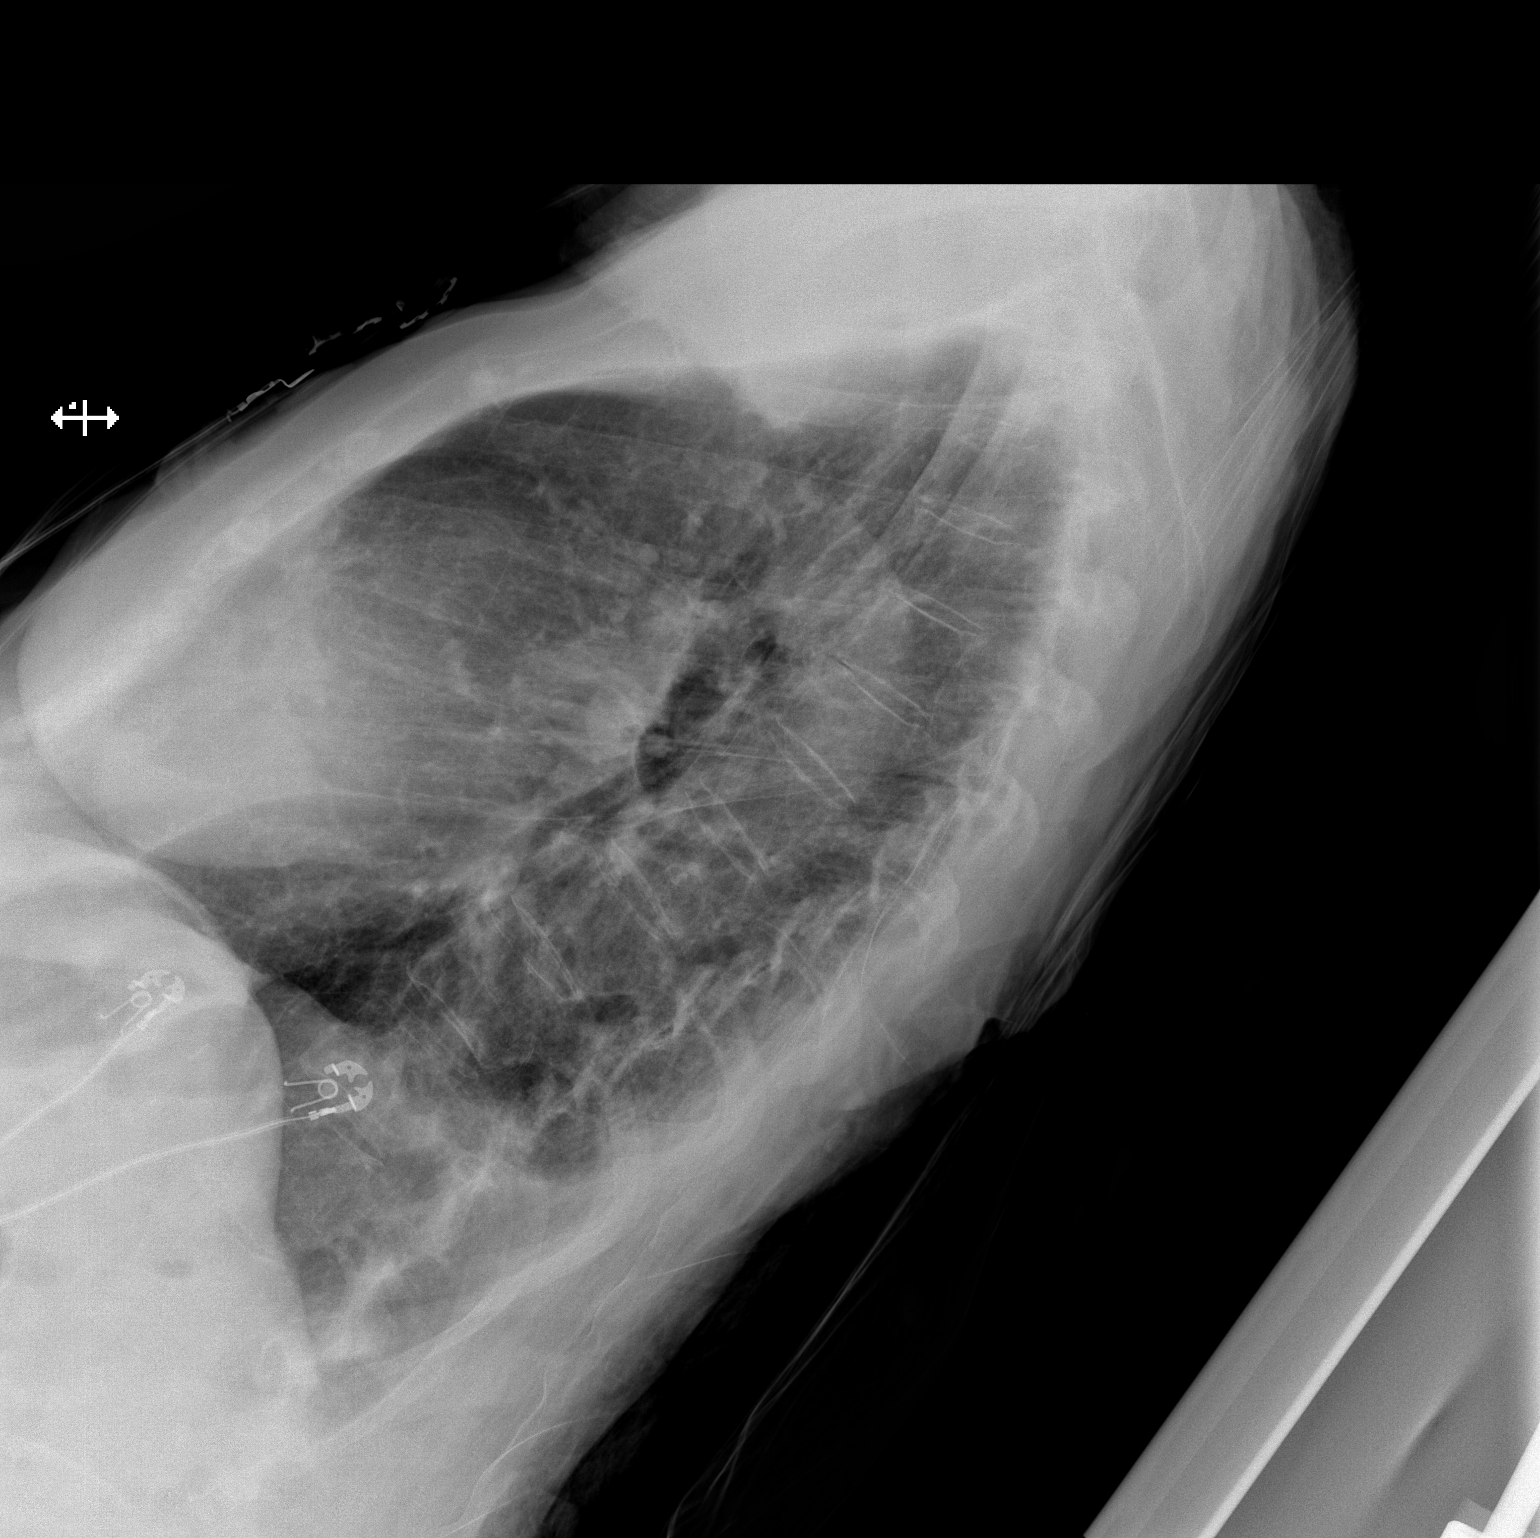

[x chest ap]
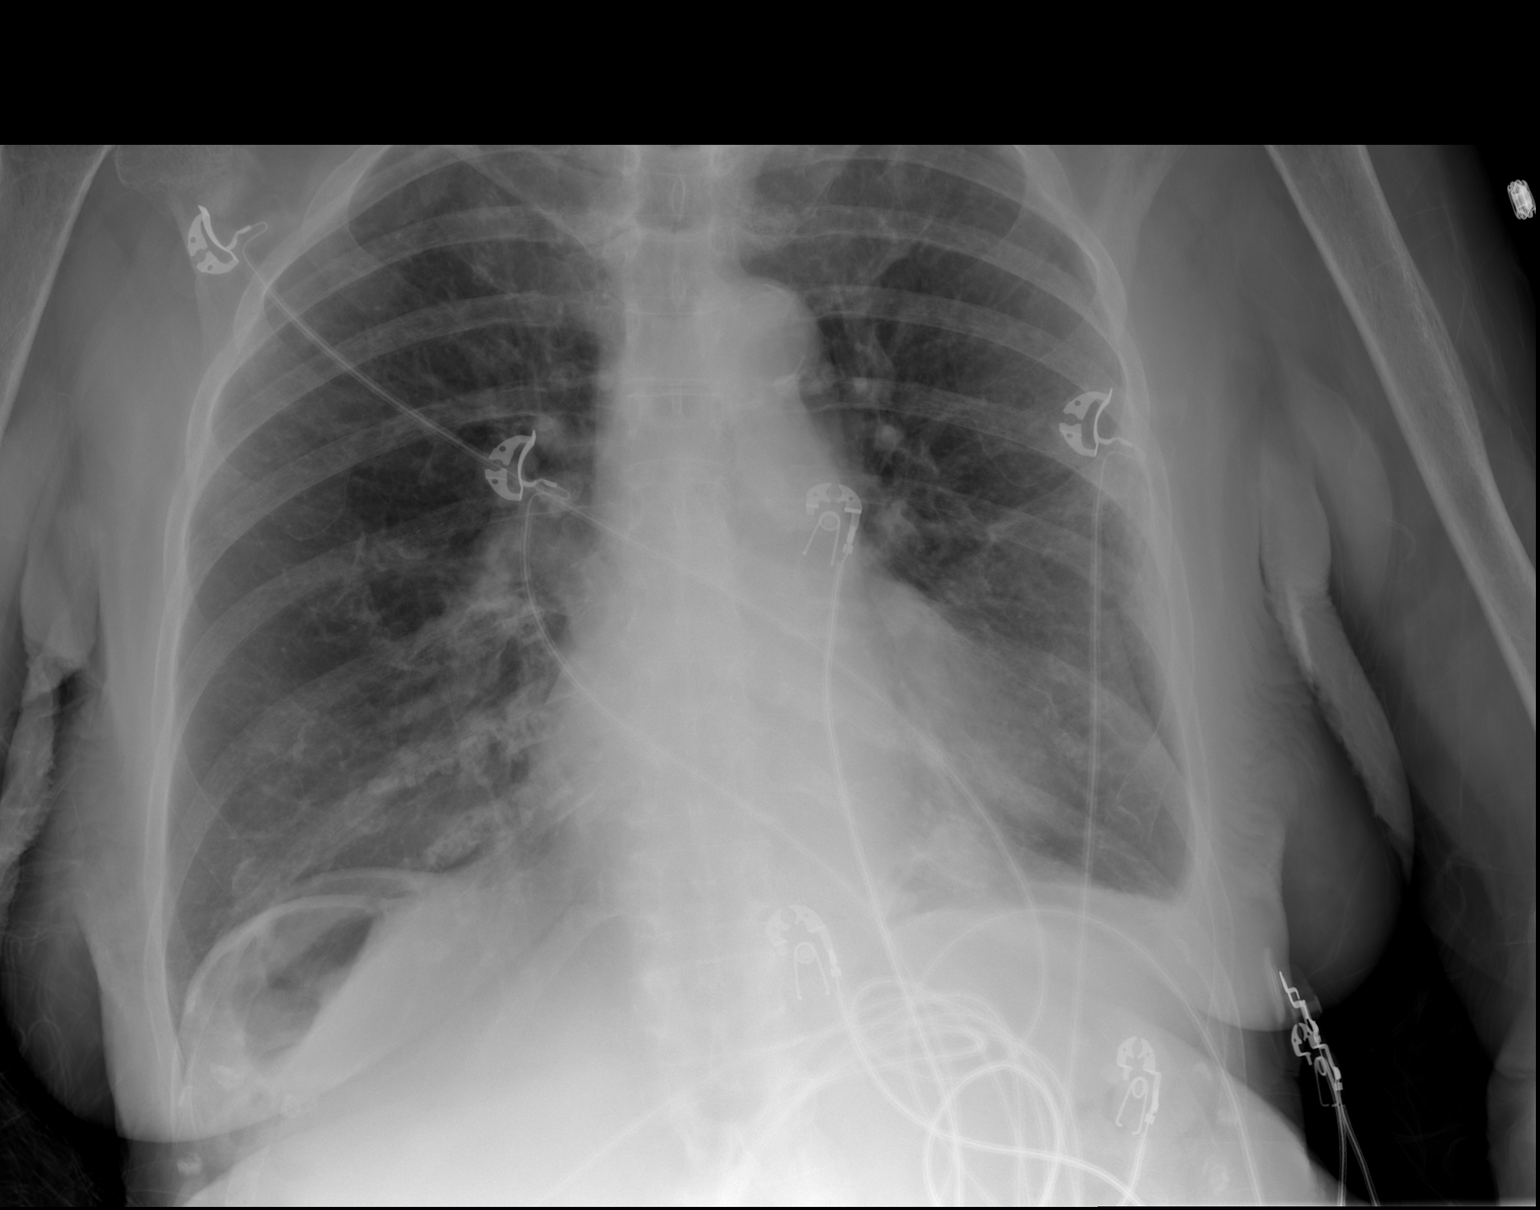

[2 of 2 positions shown; findings below may reference images not displayed]

FINDINGS: Cardiac shadow is within normal limits. The lungs are well aerated
bilaterally. No focal infiltrate or sizable effusion is seen.
Chronic blunting of left costophrenic angle is noted. No new focal
abnormality is seen. No acute bony abnormality is noted.
IMPRESSION: Chronic changes in the left base.  No acute abnormality noted.

## 2019-11-17 DIAGNOSIS — D485 Neoplasm of uncertain behavior of skin: Secondary | ICD-10-CM | POA: Diagnosis not present

## 2019-11-17 DIAGNOSIS — L820 Inflamed seborrheic keratosis: Secondary | ICD-10-CM | POA: Diagnosis not present

## 2019-11-17 DIAGNOSIS — L814 Other melanin hyperpigmentation: Secondary | ICD-10-CM | POA: Diagnosis not present

## 2019-11-17 DIAGNOSIS — Z85828 Personal history of other malignant neoplasm of skin: Secondary | ICD-10-CM | POA: Diagnosis not present

## 2019-11-17 DIAGNOSIS — D1801 Hemangioma of skin and subcutaneous tissue: Secondary | ICD-10-CM | POA: Diagnosis not present

## 2019-11-17 DIAGNOSIS — L218 Other seborrheic dermatitis: Secondary | ICD-10-CM | POA: Diagnosis not present

## 2019-11-17 DIAGNOSIS — C4441 Basal cell carcinoma of skin of scalp and neck: Secondary | ICD-10-CM | POA: Diagnosis not present

## 2019-11-17 DIAGNOSIS — L821 Other seborrheic keratosis: Secondary | ICD-10-CM | POA: Diagnosis not present

## 2019-11-17 DIAGNOSIS — I10 Essential (primary) hypertension: Secondary | ICD-10-CM | POA: Diagnosis not present

## 2019-12-01 DIAGNOSIS — Z20828 Contact with and (suspected) exposure to other viral communicable diseases: Secondary | ICD-10-CM | POA: Diagnosis not present

## 2019-12-03 DIAGNOSIS — E876 Hypokalemia: Secondary | ICD-10-CM | POA: Diagnosis not present

## 2019-12-08 DIAGNOSIS — Z20828 Contact with and (suspected) exposure to other viral communicable diseases: Secondary | ICD-10-CM | POA: Diagnosis not present

## 2019-12-19 DIAGNOSIS — Z23 Encounter for immunization: Secondary | ICD-10-CM | POA: Diagnosis not present

## 2019-12-29 DIAGNOSIS — E876 Hypokalemia: Secondary | ICD-10-CM | POA: Diagnosis not present

## 2019-12-29 LAB — BASIC METABOLIC PANEL
BUN: 32 — AB (ref 4–21)
CO2: 29 — AB (ref 13–22)
Chloride: 97 — AB (ref 99–108)
Creatinine: 1 (ref 0.5–1.1)
Glucose: 79
Potassium: 5.3 (ref 3.4–5.3)
Sodium: 132 — AB (ref 137–147)

## 2019-12-30 DIAGNOSIS — Z20828 Contact with and (suspected) exposure to other viral communicable diseases: Secondary | ICD-10-CM | POA: Diagnosis not present

## 2020-01-05 ENCOUNTER — Non-Acute Institutional Stay: Payer: Medicare HMO | Admitting: Internal Medicine

## 2020-01-05 ENCOUNTER — Encounter: Payer: Self-pay | Admitting: Internal Medicine

## 2020-01-05 DIAGNOSIS — E871 Hypo-osmolality and hyponatremia: Secondary | ICD-10-CM

## 2020-01-05 DIAGNOSIS — F418 Other specified anxiety disorders: Secondary | ICD-10-CM

## 2020-01-05 DIAGNOSIS — R69 Illness, unspecified: Secondary | ICD-10-CM | POA: Diagnosis not present

## 2020-01-05 DIAGNOSIS — I471 Supraventricular tachycardia: Secondary | ICD-10-CM

## 2020-01-05 DIAGNOSIS — E611 Iron deficiency: Secondary | ICD-10-CM

## 2020-01-05 DIAGNOSIS — I5032 Chronic diastolic (congestive) heart failure: Secondary | ICD-10-CM

## 2020-01-05 NOTE — Progress Notes (Signed)
Location:  [facility] Room Number: 759 Place of Service:  SNF (31) Provider:  Dorice Stiggers L. Lyndel Safe, MD   Virgie Dad, MD  Patient Care Team: Virgie Dad, MD as PCP - General (Internal Medicine) Lannette Donath Glo Herring., MD as Referring Physician (Internal Medicine) Satira Sark, MD as Consulting Physician (Cardiology) Kyung Rudd, MD as Consulting Physician (Radiation Oncology) Mast, Man X, NP as Nurse Practitioner (Internal Medicine)  Extended Emergency Contact Information Primary Emergency Contact: Neita Garnet of [place] Phone: [phone] Relation: Son Secondary Emergency Contact: Goldwater,Lance Address: [address]          [place], [place] [address] [name] of [place] Phone: [phone] Mobile Phone: [phone] Relation: Son  Code Status:  DNR Goals of care: Advanced Directive information Advanced Directives 01/05/2020  Does Patient Have a Medical Advance Directive? Yes  Type of Advance Directive Out of facility DNR (pink MOST or yellow form);Living will  Does patient want to make changes to medical advance directive? No - Patient declined  Copy of [place] in Chart? -  Would patient like information on creating a medical advance directive? -  Pre-existing out of facility DNR order (yellow form or pink MOST form) Yellow form placed in chart (order not valid for inpatient use)     Chief Complaint  Patient presents with  . Medical Management of Chronic Issues    Routine Visit    HPI:  Pt is a 86 y.o. female seen today for medical management of chronic diseases.    Patient has a history of diastolic CHF, hyponatremia, hypertension, diabetes mellitus, PSVT and history of breast cancer and lung cancer,.Iron Def Anemiaon Oxygen PRN,Left Sided Pleural Effusion  She has been stable in the facility No New Complains Gets her BMP checked every 2 weeks for her Hyponatremia. NO SOB or  Coughing Has Gained some weight   Past Medical History:  Diagnosis Date  . Allergic rhinitis   . Anemia    NOS  . Anxiety   . Biceps tendon rupture    Bilateral  . Breast cancer ([place] [facility]) 2001   Left s/p lumpectomy and XRT   . Bronchiectasis ([facility]) 2014   Noted on chest x-ray  . Complication of anesthesia    slow to wake up  . Cough 2014  . Depression   . Diverticulosis   . Elevated liver enzymes    Biopsy consistent with steatohepatitis  . Fundic gland polyps of stomach, benign   . GERD (gastroesophageal reflux disease) 06/2007   EGD Dr Gala Romney >sm HH, multiple fundic gland polyps  . Glaucoma 2013   both eyes  . Hemorrhoids   . Hiatal hernia   . Hx of radiation therapy 07/17/10 to 07/21/10   SRS LLL lung  . Hyperlipidemia   . Insomnia   . Low back pain    scoliosis  . Lung cancer ([facility]) 05/2010   Left 2011 - rad x 3  . Lymphedema    Left arm  . Osteoarthritis   . Osteopenia 10/25/2016  . Osteoporosis, senile   . Overactive bladder   . Pneumonia    RLL with sepsis  . PSVT (paroxysmal supraventricular tachycardia) ([facility])   . Rheumatic fever    Age 43  . Right thyroid nodule   . Steatohepatitis    liver bx  . Type 2 diabetes mellitus ([facility])    Past Surgical History:  Procedure Laterality Date  . ABDOMINAL HYSTERECTOMY  1964  .  APPENDECTOMY  1964  . BIOPSY THYROID  11/2008  . BREAST BIOPSY     X 3 w/ cystectomy  . BREAST LUMPECTOMY  2001  . CATARACT EXTRACTION Bilateral 2003   Lens implant Dr. Charise Killian  . CHOLECYSTECTOMY  1965  . COLONOSCOPY  09/11/2011   Procedure: COLONOSCOPY;  Surgeon: Dorothyann Peng, MD;  Location: [facility];  Service: Endoscopy;  Laterality: N/A;  . COLONOSCOPY  2005 /2012   Dr. Lucianne Muss- diverticulosis, ext hemorrhoids.  . COLONOSCOPY WITH PROPOFOL N/A 11/09/2017   Procedure: COLONOSCOPY WITH PROPOFOL;  Surgeon: Mauri Pole, MD;  Location: [facility];  Service: Endoscopy;  Laterality: N/A;  . COLONOSCOPY WITH PROPOFOL N/A 11/12/2017    Procedure: COLONOSCOPY WITH PROPOFOL;  Surgeon: Ladene Artist, MD;  Location: [facility];  Service: Endoscopy;  Laterality: N/A;  . CYSTOSCOPY  2007  . ESOPHAGOGASTRODUODENOSCOPY  09/11/2011   Procedure: ESOPHAGOGASTRODUODENOSCOPY (EGD);  Surgeon: Dorothyann Peng, MD;  [facility];  Service: Endoscopy;  Laterality: N/A;  . ESOPHAGOGASTRODUODENOSCOPY (EGD) WITH PROPOFOL N/A 11/08/2017   Procedure: ESOPHAGOGASTRODUODENOSCOPY (EGD) WITH PROPOFOL;  Surgeon: Mauri Pole, MD;  Location: [facility];  Service: Endoscopy;  Laterality: N/A;  . LIVER BIOPSY  2008  . LUNG BIOPSY Left 06/06/2010  . REVISION TOTAL HIP ARTHROPLASTY  2007   Left  . SKIN CANCER EXCISION     nose and left lower cheek  . TONSILLECTOMY  1930  . TOTAL HIP ARTHROPLASTY Bilateral 2005 & 2007    Dr. Earl Lagos. Aline Brochure     Allergies  Allergen Reactions  . Augmentin [Amoxicillin-Pot Clavulanate] Other (See Comments)    unknown  . Ibuprofen Hives  . Naproxen Other (See Comments)    unknown  . Statins Other (See Comments)    Feels like knives in stomach  . Surgilube [Gyne-Moistrin]     Rectal itching, burning  . Tape Rash    Outpatient Encounter Medications as of 01/05/2020  Medication Sig  . acetaminophen (TYLENOL) 500 MG tablet Take 500 mg by mouth 2 (two) times daily.   Marland Kitchen atenolol (TENORMIN) 25 MG tablet Take 1 tablet (25 mg total) by mouth daily.  . B Complex Vitamins (B COMPLEX-B12) TABS Take 1 tablet by mouth daily.  . diphenhydrAMINE (BENADRYL) 25 mg capsule Take 25 mg by mouth daily as needed.   . DULoxetine (CYMBALTA) 30 MG capsule Take 1 capsule (30 mg total) by mouth daily.  . Estradiol (IMVEXXY MAINTENANCE PACK) 4 MCG INST Place 4 mcg vaginally once a week. On Thursday  . ferrous gluconate (FERGON) 240 (27 FE) MG tablet Take 1 tablet (240 mg total) by mouth daily.  . folic acid (FOLVITE) 462 MCG tablet Take 1 tablet (400 mcg total) by mouth daily.  Marland Kitchen gabapentin (NEURONTIN) 100 MG  capsule Take 1 capsule (100 mg total) by mouth 2 (two) times daily.  Marland Kitchen latanoprost (XALATAN) 0.005 % ophthalmic solution Place 1 drop into both eyes at bedtime.  . magnesium oxide (MAG-OX) 400 (241.3 Mg) MG tablet Take 1 tablet (400 mg total) by mouth 2 (two) times daily.  . Multiple Vitamins-Minerals (PRESERVISION AREDS 2) CAPS Take 1 capsule by mouth 2 (two) times daily.  . Nutritional Supplements (FEEDING SUPPLEMENT, BOOST BREEZE,) LIQD Take 237 mLs by mouth daily. orange flavor  . omeprazole (PRILOSEC) 20 MG capsule Take 1 capsule (20 mg total) by mouth daily.  . simethicone (MYLICON) 80 MG chewable tablet Chew 80 mg by mouth 4 (four) times daily as needed for flatulence.  Marland Kitchen  sodium chloride 1 g tablet Take 1 g by mouth daily.  . [DISCONTINUED] Simethicone 80 MG TABS Take 1 tablet (80 mg total) by mouth 4 (four) times daily as needed for up to 30 days (indigestion).   No facility-administered encounter medications on file as of 01/05/2020.    Review of Systems  Review of Systems  Constitutional: Negative for activity change, appetite change, chills, diaphoresis, fatigue and fever.  HENT: Negative for mouth sores, postnasal drip, rhinorrhea, sinus pain and sore throat.   Respiratory: Negative for apnea, cough, chest tightness, shortness of breath and wheezing.   Cardiovascular: Negative for chest pain, palpitations and leg swelling.  Gastrointestinal: Negative for abdominal distention, abdominal pain, constipation, diarrhea, nausea and vomiting.  Genitourinary: Negative for dysuria and frequency.  Musculoskeletal: Negative for arthralgias, joint swelling and myalgias.  Skin: Negative for rash.  Neurological: Negative for dizziness, syncope, weakness, light-headedness and numbness.  Psychiatric/Behavioral: Negative for behavioral problems, confusion and sleep disturbance.     Immunization History  Administered Date(s) Administered  . Influenza Whole 09/22/2008, 09/16/2012, 09/30/2013   . Influenza, High Dose Seasonal PF 09/25/2017  . Influenza,inj,Quad PF,6+ Mos 10/09/2018  . Influenza-Unspecified 10/02/2014, 09/15/2015, 09/27/2016  . Pneumococcal Conjugate-13 11/29/2017  . Pneumococcal Polysaccharide-23 08/17/2006  . Td 12/17/2005   Pertinent  Health Maintenance Due  Topic Date Due  . FOOT EXAM  07/19/1931  . URINE MICROALBUMIN  04/30/2008  . HEMOGLOBIN A1C  10/17/2017  . INFLUENZA VACCINE  07/18/2019  . OPHTHALMOLOGY EXAM  12/27/2019  . DEXA SCAN  Completed  . PNA vac Low Risk Adult  Completed   Fall Risk  10/07/2018 09/19/2017 05/17/2016 04/20/2016 04/19/2016  Falls in the past year? Yes No No No No  Number falls in past yr: 1 - - - -  Injury with Fall? No - - - -   Functional Status Survey:    Vitals:   01/05/20 1009  BP: 122/74  Pulse: 63  Resp: 18  Temp: (!) 97.2 F (36.2 C)  TempSrc: Oral  SpO2: 94%  Weight: 140 lb (63.5 kg)  Height: 5' 4"  (1.626 m)   Body mass index is 24.03 kg/m. Physical Exam Constitutional: Oriented to person, place, and time. Well-developed and well-nourished.  HENT:  Head: Normocephalic.  Mouth/Throat: Oropharynx is clear and moist.  Eyes: Pupils are equal, round, and reactive to light.  Neck: Neck supple.  Cardiovascular: Normal rate and normal heart sounds.  No murmur heard. Pulmonary/Chest: Effort normal and breath sounds normal. No respiratory distress. No wheezes. She has no rales.  Abdominal: Soft. Bowel sounds are normal. No distension. There is no tenderness. There is no rebound.  Musculoskeletal: Mild Edema Lymphadenopathy: none Neurological: Alert and oriented to person, place, and time.  Skin: Skin is warm and dry.  Psychiatric: Normal mood and affect. Behavior is normal. Thought content normal.   Labs reviewed: Recent Labs    02/21/19 1721 02/22/19 0450 02/28/19 0356 03/01/19 0430 03/03/19 0408 03/03/19 0408 03/04/19 0325 03/04/19 0325 03/05/19 0359 03/12/19 0000 07/28/19 0000 07/28/19 0000  08/25/19 0000 09/24/19 0000 12/29/19 0000  NA 132*   < > 127*   < > 130*   < > 130*   < > 129*   < > 134*   < > 131* 132* 132*  K 3.3*   < > 4.5   < > 4.1   < > 4.1   < > 4.4   < > 5.4*   < > 5.0 5.1 5.3  CL 90*   < >  87*   < > 93*   < > 92*   < > 95*   < > 100   < > 99 97 97*  CO2 30   < > 29   < > 30   < > 29   < > 25   < > 28   < > 28 27 29*  GLUCOSE 98   < > 85   < > 137*  --  102*  --  84  --   --   --   --   --   --   BUN 18   < > 30*   < > 24*   < > 20   < > 21   < > 30*   < > 31* 36* 32*  CREATININE 0.78   < > 0.94   < > 0.87   < > 0.86   < > 0.81   < > 1.0   < > 1.0 1.0 1.0  CALCIUM 8.9   < > 8.7*   < > 9.0   < > 9.2   < > 8.9   < > 9.2  --  8.7 8.8  --   MG 1.5*   < > 1.9  --   --   --  1.9  --  1.9  --   --   --   --   --   --   PHOS 3.0  --   --   --   --   --   --   --   --   --   --   --   --   --   --    < > = values in this interval not displayed.   Recent Labs    02/21/19 1721 03/12/19 0000  AST 54* 30  ALT 39 27  ALKPHOS 161* 113  BILITOT 1.7*  --   PROT 8.4*  --   ALBUMIN 3.8  --    Recent Labs    02/21/19 1721 02/22/19 0450 03/01/19 0430 03/01/19 0430 03/02/19 0433 03/04/19 0325 03/12/19 0000  WBC 7.5   < > 7.3   < > 7.1 6.9 5.2  NEUTROABS 6.0  --   --   --   --   --   --   HGB 12.9   < > 11.5*   < > 11.6* 12.2 11.7*  HCT 40.0   < > 35.3*   < > 35.9* 39.1 35*  MCV 99.8   < > 100.0  --  101.1* 102.1*  --   PLT 175   < > 244   < > 251 252 287   < > = values in this interval not displayed.   Lab Results  Component Value Date   TSH 1.964 02/21/2019   Lab Results  Component Value Date   HGBA1C 5.3 04/16/2017   Lab Results  Component Value Date   CHOL 188 04/12/2016   HDL 62 04/12/2016   LDLCALC 102 04/12/2016   TRIG 122 04/12/2016   CHOLHDL 3.0 Ratio 06/07/2008    Significant Diagnostic Results in last 30 days:   Assessment/Plan Hyponatremia Last Sodium 132  PSVT (paroxysmal supraventricular tachycardia) (HCC) Doing well on  Tenormin  Chronic diastolic CHF (congestive heart failure) ([place]) Does not like to take Lasix due to Urinary issues and Hyponatremia Continue to moniotr Iron deficiency Repeat CBC Depression with anxiety Stable on Cymbalta Neuropathy Uses Electric chair. Independent  in her transfers On Neurontin   Family/ staff Communication:   Labs/tests ordered:  CBC BMP  Total time spent in this patient care encounter was  _25  minutes; greater than 50% of the visit spent counseling patient and staff, reviewing records , Labs and coordinating care for problems addressed at this encounter.

## 2020-01-06 DIAGNOSIS — Z20828 Contact with and (suspected) exposure to other viral communicable diseases: Secondary | ICD-10-CM | POA: Diagnosis not present

## 2020-01-12 DIAGNOSIS — I10 Essential (primary) hypertension: Secondary | ICD-10-CM | POA: Diagnosis not present

## 2020-01-13 DIAGNOSIS — Z20828 Contact with and (suspected) exposure to other viral communicable diseases: Secondary | ICD-10-CM | POA: Diagnosis not present

## 2020-01-20 DIAGNOSIS — Z20828 Contact with and (suspected) exposure to other viral communicable diseases: Secondary | ICD-10-CM | POA: Diagnosis not present

## 2020-01-26 DIAGNOSIS — I10 Essential (primary) hypertension: Secondary | ICD-10-CM | POA: Diagnosis not present

## 2020-01-27 DIAGNOSIS — Z20828 Contact with and (suspected) exposure to other viral communicable diseases: Secondary | ICD-10-CM | POA: Diagnosis not present

## 2020-01-28 DIAGNOSIS — M6281 Muscle weakness (generalized): Secondary | ICD-10-CM | POA: Diagnosis not present

## 2020-01-28 DIAGNOSIS — R2681 Unsteadiness on feet: Secondary | ICD-10-CM | POA: Diagnosis not present

## 2020-01-28 DIAGNOSIS — Z7389 Other problems related to life management difficulty: Secondary | ICD-10-CM | POA: Diagnosis not present

## 2020-01-28 DIAGNOSIS — M79641 Pain in right hand: Secondary | ICD-10-CM | POA: Diagnosis not present

## 2020-01-28 DIAGNOSIS — M79642 Pain in left hand: Secondary | ICD-10-CM | POA: Diagnosis not present

## 2020-01-28 DIAGNOSIS — R29898 Other symptoms and signs involving the musculoskeletal system: Secondary | ICD-10-CM | POA: Diagnosis not present

## 2020-01-28 DIAGNOSIS — Z9181 History of falling: Secondary | ICD-10-CM | POA: Diagnosis not present

## 2020-02-01 DIAGNOSIS — M79641 Pain in right hand: Secondary | ICD-10-CM | POA: Diagnosis not present

## 2020-02-01 DIAGNOSIS — Z9181 History of falling: Secondary | ICD-10-CM | POA: Diagnosis not present

## 2020-02-01 DIAGNOSIS — Z7389 Other problems related to life management difficulty: Secondary | ICD-10-CM | POA: Diagnosis not present

## 2020-02-01 DIAGNOSIS — R2681 Unsteadiness on feet: Secondary | ICD-10-CM | POA: Diagnosis not present

## 2020-02-01 DIAGNOSIS — M79642 Pain in left hand: Secondary | ICD-10-CM | POA: Diagnosis not present

## 2020-02-01 DIAGNOSIS — M6281 Muscle weakness (generalized): Secondary | ICD-10-CM | POA: Diagnosis not present

## 2020-02-01 DIAGNOSIS — R29898 Other symptoms and signs involving the musculoskeletal system: Secondary | ICD-10-CM | POA: Diagnosis not present

## 2020-02-03 DIAGNOSIS — Z7389 Other problems related to life management difficulty: Secondary | ICD-10-CM | POA: Diagnosis not present

## 2020-02-03 DIAGNOSIS — M6281 Muscle weakness (generalized): Secondary | ICD-10-CM | POA: Diagnosis not present

## 2020-02-03 DIAGNOSIS — M79642 Pain in left hand: Secondary | ICD-10-CM | POA: Diagnosis not present

## 2020-02-03 DIAGNOSIS — R2681 Unsteadiness on feet: Secondary | ICD-10-CM | POA: Diagnosis not present

## 2020-02-03 DIAGNOSIS — Z20828 Contact with and (suspected) exposure to other viral communicable diseases: Secondary | ICD-10-CM | POA: Diagnosis not present

## 2020-02-03 DIAGNOSIS — Z9181 History of falling: Secondary | ICD-10-CM | POA: Diagnosis not present

## 2020-02-03 DIAGNOSIS — R29898 Other symptoms and signs involving the musculoskeletal system: Secondary | ICD-10-CM | POA: Diagnosis not present

## 2020-02-03 DIAGNOSIS — M79641 Pain in right hand: Secondary | ICD-10-CM | POA: Diagnosis not present

## 2020-02-05 DIAGNOSIS — R29898 Other symptoms and signs involving the musculoskeletal system: Secondary | ICD-10-CM | POA: Diagnosis not present

## 2020-02-05 DIAGNOSIS — R2681 Unsteadiness on feet: Secondary | ICD-10-CM | POA: Diagnosis not present

## 2020-02-05 DIAGNOSIS — M6281 Muscle weakness (generalized): Secondary | ICD-10-CM | POA: Diagnosis not present

## 2020-02-05 DIAGNOSIS — M79641 Pain in right hand: Secondary | ICD-10-CM | POA: Diagnosis not present

## 2020-02-05 DIAGNOSIS — Z9181 History of falling: Secondary | ICD-10-CM | POA: Diagnosis not present

## 2020-02-05 DIAGNOSIS — M79642 Pain in left hand: Secondary | ICD-10-CM | POA: Diagnosis not present

## 2020-02-05 DIAGNOSIS — Z7389 Other problems related to life management difficulty: Secondary | ICD-10-CM | POA: Diagnosis not present

## 2020-02-08 DIAGNOSIS — M79641 Pain in right hand: Secondary | ICD-10-CM | POA: Diagnosis not present

## 2020-02-08 DIAGNOSIS — M79642 Pain in left hand: Secondary | ICD-10-CM | POA: Diagnosis not present

## 2020-02-08 DIAGNOSIS — Z7389 Other problems related to life management difficulty: Secondary | ICD-10-CM | POA: Diagnosis not present

## 2020-02-08 DIAGNOSIS — M6281 Muscle weakness (generalized): Secondary | ICD-10-CM | POA: Diagnosis not present

## 2020-02-08 DIAGNOSIS — Z9181 History of falling: Secondary | ICD-10-CM | POA: Diagnosis not present

## 2020-02-08 DIAGNOSIS — R2681 Unsteadiness on feet: Secondary | ICD-10-CM | POA: Diagnosis not present

## 2020-02-08 DIAGNOSIS — R29898 Other symptoms and signs involving the musculoskeletal system: Secondary | ICD-10-CM | POA: Diagnosis not present

## 2020-02-09 DIAGNOSIS — E876 Hypokalemia: Secondary | ICD-10-CM | POA: Diagnosis not present

## 2020-02-09 DIAGNOSIS — I10 Essential (primary) hypertension: Secondary | ICD-10-CM | POA: Diagnosis not present

## 2020-02-09 LAB — BASIC METABOLIC PANEL
BUN: 30 — AB (ref 4–21)
CO2: 28 — AB (ref 13–22)
Chloride: 98 — AB (ref 99–108)
Creatinine: 1 (ref 0.5–1.1)
Glucose: 83
Potassium: 5.3 (ref 3.4–5.3)
Sodium: 132 — AB (ref 137–147)

## 2020-02-09 LAB — COMPREHENSIVE METABOLIC PANEL: Calcium: 8.8 (ref 8.7–10.7)

## 2020-02-11 DIAGNOSIS — Z9181 History of falling: Secondary | ICD-10-CM | POA: Diagnosis not present

## 2020-02-11 DIAGNOSIS — R29898 Other symptoms and signs involving the musculoskeletal system: Secondary | ICD-10-CM | POA: Diagnosis not present

## 2020-02-11 DIAGNOSIS — Z7389 Other problems related to life management difficulty: Secondary | ICD-10-CM | POA: Diagnosis not present

## 2020-02-11 DIAGNOSIS — M6281 Muscle weakness (generalized): Secondary | ICD-10-CM | POA: Diagnosis not present

## 2020-02-11 DIAGNOSIS — M79641 Pain in right hand: Secondary | ICD-10-CM | POA: Diagnosis not present

## 2020-02-11 DIAGNOSIS — M79642 Pain in left hand: Secondary | ICD-10-CM | POA: Diagnosis not present

## 2020-02-11 DIAGNOSIS — R2681 Unsteadiness on feet: Secondary | ICD-10-CM | POA: Diagnosis not present

## 2020-02-23 DIAGNOSIS — E876 Hypokalemia: Secondary | ICD-10-CM | POA: Diagnosis not present

## 2020-02-23 DIAGNOSIS — I10 Essential (primary) hypertension: Secondary | ICD-10-CM | POA: Diagnosis not present

## 2020-02-23 DIAGNOSIS — C4441 Basal cell carcinoma of skin of scalp and neck: Secondary | ICD-10-CM | POA: Diagnosis not present

## 2020-02-23 LAB — BASIC METABOLIC PANEL
BUN: 29 — AB (ref 4–21)
CO2: 26 — AB (ref 13–22)
Chloride: 98 — AB (ref 99–108)
Creatinine: 0.9 (ref 0.5–1.1)
Glucose: 83
Potassium: 5.2 (ref 3.4–5.3)
Sodium: 133 — AB (ref 137–147)

## 2020-02-23 LAB — COMPREHENSIVE METABOLIC PANEL: Calcium: 8.8 (ref 8.7–10.7)

## 2020-03-08 DIAGNOSIS — E876 Hypokalemia: Secondary | ICD-10-CM | POA: Diagnosis not present

## 2020-03-08 DIAGNOSIS — I10 Essential (primary) hypertension: Secondary | ICD-10-CM | POA: Diagnosis not present

## 2020-03-08 LAB — BASIC METABOLIC PANEL
BUN: 23 — AB (ref 4–21)
CO2: 30 — AB (ref 13–22)
Chloride: 96 — AB (ref 99–108)
Creatinine: 0.8 (ref 0.5–1.1)
Glucose: 75
Potassium: 4.7 (ref 3.4–5.3)
Sodium: 132 — AB (ref 137–147)

## 2020-03-08 LAB — COMPREHENSIVE METABOLIC PANEL: Calcium: 8.9 (ref 8.7–10.7)

## 2020-03-16 ENCOUNTER — Non-Acute Institutional Stay: Payer: Medicare HMO | Admitting: Nurse Practitioner

## 2020-03-16 ENCOUNTER — Encounter: Payer: Self-pay | Admitting: Nurse Practitioner

## 2020-03-16 DIAGNOSIS — I10 Essential (primary) hypertension: Secondary | ICD-10-CM | POA: Diagnosis not present

## 2020-03-16 DIAGNOSIS — E871 Hypo-osmolality and hyponatremia: Secondary | ICD-10-CM | POA: Diagnosis not present

## 2020-03-16 DIAGNOSIS — R69 Illness, unspecified: Secondary | ICD-10-CM | POA: Diagnosis not present

## 2020-03-16 DIAGNOSIS — E1140 Type 2 diabetes mellitus with diabetic neuropathy, unspecified: Secondary | ICD-10-CM | POA: Diagnosis not present

## 2020-03-16 DIAGNOSIS — N959 Unspecified menopausal and perimenopausal disorder: Secondary | ICD-10-CM | POA: Insufficient documentation

## 2020-03-16 DIAGNOSIS — K219 Gastro-esophageal reflux disease without esophagitis: Secondary | ICD-10-CM | POA: Diagnosis not present

## 2020-03-16 DIAGNOSIS — G629 Polyneuropathy, unspecified: Secondary | ICD-10-CM

## 2020-03-16 DIAGNOSIS — D500 Iron deficiency anemia secondary to blood loss (chronic): Secondary | ICD-10-CM | POA: Diagnosis not present

## 2020-03-16 DIAGNOSIS — F418 Other specified anxiety disorders: Secondary | ICD-10-CM

## 2020-03-16 DIAGNOSIS — E119 Type 2 diabetes mellitus without complications: Secondary | ICD-10-CM | POA: Insufficient documentation

## 2020-03-16 NOTE — Assessment & Plan Note (Signed)
Stable, continue weekly Estradiol.

## 2020-03-16 NOTE — Assessment & Plan Note (Signed)
Her mood is stable, continue Duloxetine.

## 2020-03-16 NOTE — Assessment & Plan Note (Signed)
Hx of diet controlled T2DM, Podiatry for foot exam, Ophthalmology for eye exam, obtain urine micro albumin.

## 2020-03-16 NOTE — Progress Notes (Signed)
Location:   [place] Room Number: [place] of Service:  ALF [id number]) Provider:  Jillien Yakel, [place] NP  Virgie Dad, MD  Patient Care Team: Virgie Dad, MD as PCP - General (Internal Medicine) Lanelle Bal., MD as Referring Physician (Internal Medicine) Satira Sark, MD as Consulting Physician (Cardiology) Kyung Rudd, MD as Consulting Physician (Radiation Oncology) Asjia Berrios X, NP as Nurse Practitioner (Internal Medicine)  Extended Emergency Contact Information Primary Emergency Contact: Neita Garnet of [place] Phone: [phone] Relation: Son Secondary Emergency Contact: Mathison,Lance Address: [address]          [place], [place] [address] [name] of [place] Phone: [phone] Mobile Phone: [phone] Relation: Son  Code Status:  DNR Goals of care: Advanced Directive information Advanced Directives 03/16/2020  Does Patient Have a Medical Advance Directive? Yes  Type of Advance Directive Out of facility DNR (pink MOST or yellow form);Living will;Healthcare Power of Attorney  Does patient want to make changes to medical advance directive? No - Patient declined  Copy of [place] in Chart? Yes - validated most recent copy scanned in chart (See row information)  Would patient like information on creating a medical advance directive? -  Pre-existing out of facility DNR order (yellow form or pink MOST form) Yellow form placed in chart (order not valid for inpatient use);Pink MOST form placed in chart (order not valid for inpatient use)     Chief Complaint  Patient presents with  . Medical Management of Chronic Issues  . Health Maintenance    Foot and eye exam, urine microalbumin, Hemoglobin A1C    HPI:  Pt is a 86 y.o. female seen today for medical management of chronic diseases.    The patient resides in [place] [facility] for safety, care assistance, electric scooter to get around, her mood is  stable on Duloxetine 70m qd. Hx of Hyponatremia, Na in 130s, chronic, on Salt Tab daily. GERD, stable, on Omeprazole 283mqd. Peripheral neuropathy, stable, on Gabapentin 10062mid, Tylenol 500m1md.  Anemia, stable, on Fe, Folic acid, Vit B12.Y63st menopausal symptoms, stable, on Estradiol 4mcg46mly. HTN, blood pressure is controlled, on Atenolol 25mg 40m   Past Medical History:  Diagnosis Date  . Allergic rhinitis   . Anemia    NOS  . Anxiety   . Biceps tendon rupture    Bilateral  . Breast cancer (HCC) 2[place]   Left s/p lumpectomy and XRT   . Bronchiectasis (HCC) 2[place]   Noted on chest x-ray  . Complication of anesthesia    slow to wake up  . Cough 2014  . Depression   . Diverticulosis   . Elevated liver enzymes    Biopsy consistent with steatohepatitis  . Fundic gland polyps of stomach, benign   . GERD (gastroesophageal reflux disease) 06/2007   EGD Dr Rourk Gala RomneyH, multiple fundic gland polyps  . Glaucoma 2013   both eyes  . Hemorrhoids   . Hiatal hernia   . Hx of radiation therapy 07/17/10 to 07/21/10   SRS LLL lung  . Hyperlipidemia   . Insomnia   . Low back pain    scoliosis  . Lung cancer (HCC) [id number]   Left 2011 - rad x 3  . Lymphedema    Left arm  . Osteoarthritis   . Osteopenia 10/25/2016  . Osteoporosis, senile   . Overactive bladder   . Pneumonia    RLL with sepsis  .  PSVT (paroxysmal supraventricular tachycardia) ([place])   . Rheumatic fever    Age 60  . Right thyroid nodule   . Steatohepatitis    liver bx  . Type 2 diabetes mellitus ([place])    Past Surgical History:  Procedure Laterality Date  . ABDOMINAL HYSTERECTOMY  1964  . APPENDECTOMY  1964  . BIOPSY THYROID  11/2008  . BREAST BIOPSY     X 3 w/ cystectomy  . BREAST LUMPECTOMY  2001  . CATARACT EXTRACTION Bilateral 2003   Lens implant Dr. Charise Killian  . CHOLECYSTECTOMY  1965  . COLONOSCOPY  09/11/2011   Procedure: COLONOSCOPY;  Surgeon: Dorothyann Peng, MD;  [facility];  Service:  Endoscopy;  Laterality: N/A;  . COLONOSCOPY  2005 /2012   Dr. Lucianne Muss- diverticulosis, ext hemorrhoids.  . COLONOSCOPY WITH PROPOFOL N/A 11/09/2017   Procedure: COLONOSCOPY WITH PROPOFOL;  Surgeon: Mauri Pole, MD;  [facility];  Service: Endoscopy;  Laterality: N/A;  . COLONOSCOPY WITH PROPOFOL N/A 11/12/2017   Procedure: COLONOSCOPY WITH PROPOFOL;  Surgeon: Ladene Artist, MD;  [facility];  Service: Endoscopy;  Laterality: N/A;  . CYSTOSCOPY  2007  . ESOPHAGOGASTRODUODENOSCOPY  09/11/2011   Procedure: ESOPHAGOGASTRODUODENOSCOPY (EGD);  Surgeon: Dorothyann Peng, MD;  [facility];  Service: Endoscopy;  Laterality: N/A;  . ESOPHAGOGASTRODUODENOSCOPY (EGD) WITH PROPOFOL N/A 11/08/2017   Procedure: ESOPHAGOGASTRODUODENOSCOPY (EGD) WITH PROPOFOL;  Surgeon: Mauri Pole, MD;  [facility];  Service: Endoscopy;  Laterality: N/A;  . LIVER BIOPSY  2008  . LUNG BIOPSY Left 06/06/2010  . REVISION TOTAL HIP ARTHROPLASTY  2007   Left  . SKIN CANCER EXCISION     nose and left lower cheek  . TONSILLECTOMY  1930  . TOTAL HIP ARTHROPLASTY Bilateral 2005 & 2007    Dr. Earl Lagos. Aline Brochure     Allergies  Allergen Reactions  . Augmentin [Amoxicillin-Pot Clavulanate] Other (See Comments)    unknown  . Ibuprofen Hives  . Naproxen Other (See Comments)    unknown  . Statins Other (See Comments)    Feels like knives in stomach  . Surgilube [Gyne-Moistrin]     Rectal itching, burning  . Tape Rash    Allergies as of 03/16/2020      Reactions   Augmentin [amoxicillin-pot Clavulanate] Other (See Comments)   unknown   Ibuprofen Hives   Naproxen Other (See Comments)   unknown   Statins Other (See Comments)   Feels like knives in stomach   Surgilube [gyne-moistrin]    Rectal itching, burning   Tape Rash      Medication List       Accurate as of March 16, 2020  1:41 PM. If you have any questions, ask your nurse or doctor.          acetaminophen 500 MG tablet Commonly known as: TYLENOL Take 500 mg by mouth 2 (two) times daily.   atenolol 25 MG tablet Commonly known as: TENORMIN Take 1 tablet (25 mg total) by mouth daily.   B Complex-B12 Tabs Take 1 tablet by mouth daily.   diphenhydrAMINE 25 mg capsule Commonly known as: BENADRYL Take 25 mg by mouth daily as needed.   DULoxetine 30 MG capsule Commonly known as: CYMBALTA Take 1 capsule (30 mg total) by mouth daily.   feeding supplement (BOOST BREEZE) Liqd Take 237 mLs by mouth daily. orange flavor   ferrous gluconate 240 (27 FE) MG tablet Commonly known as: FERGON Take 1 tablet (240  mg total) by mouth daily.   folic acid 220 MCG tablet Commonly known as: FOLVITE Take 1 tablet (400 mcg total) by mouth daily.   gabapentin 100 MG capsule Commonly known as: NEURONTIN Take 1 capsule (100 mg total) by mouth 2 (two) times daily.   Imvexxy Maintenance Pack 4 MCG Inst Generic drug: Estradiol Place 4 mcg vaginally once a week. On Thursday   latanoprost 0.005 % ophthalmic solution Commonly known as: XALATAN Place 1 drop into both eyes at bedtime.   magnesium oxide 400 (241.3 Mg) MG tablet Commonly known as: MAG-OX Take 1 tablet (400 mg total) by mouth 2 (two) times daily.   omeprazole 20 MG capsule Commonly known as: PRILOSEC Take 1 capsule (20 mg total) by mouth daily.   PreserVision AREDS 2 Caps Take 1 capsule by mouth 2 (two) times daily.   simethicone 80 MG chewable tablet Commonly known as: MYLICON Chew 80 mg by mouth 4 (four) times daily as needed for flatulence.   sodium chloride 1 g tablet Take 1 g by mouth daily.       Review of Systems  Constitutional: Negative for activity change, appetite change, fatigue, fever and unexpected weight change.  HENT: Positive for hearing loss. Negative for congestion and voice change.   Respiratory: Negative for cough and shortness of breath.   Cardiovascular: Positive for leg swelling.   Gastrointestinal: Negative for abdominal distention, abdominal pain, constipation and diarrhea.  Genitourinary: Positive for frequency. Negative for difficulty urinating and dysuria.  Musculoskeletal: Positive for arthralgias and gait problem.  Skin: Negative for color change.  Neurological: Positive for weakness. Negative for dizziness, speech difficulty and headaches.       Low body weakness, uses electric scooter for mobility.   Psychiatric/Behavioral: Negative for agitation, behavioral problems and sleep disturbance. The patient is not nervous/anxious.     Immunization History  Administered Date(s) Administered  . Influenza Whole 09/22/2008, 09/16/2012, 09/30/2013  . Influenza, High Dose Seasonal PF 09/25/2017, 09/19/2019  . Influenza,inj,Quad PF,6+ Mos 10/09/2018  . Influenza-Unspecified 10/02/2014, 09/15/2015, 09/27/2016  . Moderna SARS-COVID-2 Vaccination 12/19/2019, 01/16/2020  . Pneumococcal Conjugate-13 11/29/2017  . Pneumococcal Polysaccharide-23 08/17/2006  . Td 12/17/2005   Pertinent  Health Maintenance Due  Topic Date Due  . FOOT EXAM  Never done  . URINE MICROALBUMIN  04/30/2008  . HEMOGLOBIN A1C  10/17/2017  . OPHTHALMOLOGY EXAM  12/27/2019  . INFLUENZA VACCINE  Completed  . DEXA SCAN  Completed  . PNA vac Low Risk Adult  Completed   Fall Risk  10/07/2018 09/19/2017 05/17/2016 04/20/2016 04/19/2016  Falls in the past year? Yes No No No No  Number falls in past yr: 1 - - - -  Injury with Fall? No - - - -   Functional Status Survey:    Vitals:   03/16/20 0934  BP: 118/72  Pulse: 72  Resp: 18  Temp: 97.9 F (36.6 C)  SpO2: 94%  Weight: 142 lb (64.4 kg)  Height: 5' 4"  (1.626 m)   Body mass index is 24.37 kg/m. Physical Exam Vitals and nursing note reviewed.  Constitutional:      General: She is not in acute distress.    Appearance: Normal appearance. She is normal weight. She is not ill-appearing.  HENT:     Head: Normocephalic and atraumatic.      Nose: Nose normal.     Mouth/Throat:     Mouth: Mucous membranes are moist.  Eyes:     Extraocular Movements: Extraocular movements intact.  Conjunctiva/sclera: Conjunctivae normal.     Pupils: Pupils are equal, round, and reactive to light.  Cardiovascular:     Rate and Rhythm: Normal rate and regular rhythm.     Heart sounds: No murmur.  Pulmonary:     Breath sounds: No rales.  Abdominal:     General: Bowel sounds are normal. There is no distension.     Tenderness: There is no abdominal tenderness. There is no guarding.  Musculoskeletal:     Cervical back: Normal range of motion and neck supple.     Right lower leg: Edema present.     Left lower leg: Edema present.     Comments: Trace edema BLE.   Skin:    General: Skin is warm and dry.  Neurological:     General: No focal deficit present.     Mental Status: She is alert and oriented to person, place, and time. Mental status is at baseline.     Cranial Nerves: No cranial nerve deficit.     Motor: No weakness.     Coordination: Coordination normal.     Gait: Gait abnormal.  Psychiatric:        Mood and Affect: Mood normal.        Behavior: Behavior normal.        Thought Content: Thought content normal.        Judgment: Judgment normal.     Labs reviewed: Recent Labs    02/09/20 0000 02/23/20 0000 03/08/20 0000  NA 132* 133* 132*  K 5.3 5.2 4.7  CL 98* 98* 96*  CO2 28* 26* 30*  BUN 30* 29* 23*  CREATININE 1.0 0.9 0.8  CALCIUM 8.8 8.8 8.9   No results for input(s): AST, ALT, ALKPHOS, BILITOT, PROT, ALBUMIN in the last 8760 hours. No results for input(s): WBC, NEUTROABS, HGB, HCT, MCV, PLT in the last 8760 hours. Lab Results  Component Value Date   TSH 1.964 02/21/2019   Lab Results  Component Value Date   HGBA1C 5.3 04/16/2017   Lab Results  Component Value Date   CHOL 188 04/12/2016   HDL 62 04/12/2016   LDLCALC 102 04/12/2016   TRIG 122 04/12/2016   CHOLHDL 3.0 Ratio 06/07/2008    Significant  Diagnostic Results in last 30 days:  No results found.  Assessment/Plan T2DM (type 2 diabetes mellitus) ([place]) Hx of diet controlled T2DM, Podiatry for foot exam, Ophthalmology for eye exam, obtain urine micro albumin.   GERD (gastroesophageal reflux disease) Stable, continue Omeprazole.   HTN (hypertension) Blood pressure is controlled, continue Atenolol.   Neuropathy Lower body weakness, electric scooter for mobility, continue Tylenol, Gabapentin.   Blood loss anemia Stable, continue Fe, Vit O97, Folic acid, update CBC/diff.   Depression with anxiety Her mood is stable, continue Duloxetine.   Hyponatremia Stable, baseline serum Na 130s, last 132 03/08/20, continue salt tab.   Postmenopausal symptoms Stable, continue weekly Estradiol.      Family/ staff Communication: plan of care reviewed with the patient and charge nurse.   Labs/tests ordered: urine micro albumin, CBC/diff.   Time spend 40 minutes.

## 2020-03-16 NOTE — Assessment & Plan Note (Signed)
Lower body weakness, electric scooter for mobility, continue Tylenol, Gabapentin.

## 2020-03-16 NOTE — Assessment & Plan Note (Signed)
Stable, continue Fe, Vit Y17, Folic acid, update CBC/diff.

## 2020-03-16 NOTE — Assessment & Plan Note (Signed)
Stable, baseline serum Na 130s, last 132 03/08/20, continue salt tab.

## 2020-03-16 NOTE — Assessment & Plan Note (Signed)
Blood pressure is controlled, continue Atenolol.

## 2020-03-16 NOTE — Assessment & Plan Note (Signed)
Stable, continue Omeprazole.

## 2020-03-22 DIAGNOSIS — E876 Hypokalemia: Secondary | ICD-10-CM | POA: Diagnosis not present

## 2020-03-22 DIAGNOSIS — I10 Essential (primary) hypertension: Secondary | ICD-10-CM | POA: Diagnosis not present

## 2020-03-22 DIAGNOSIS — I270 Primary pulmonary hypertension: Secondary | ICD-10-CM | POA: Diagnosis not present

## 2020-03-22 LAB — CBC AND DIFFERENTIAL
HCT: 30 — AB (ref 36–46)
Hemoglobin: 9.9 — AB (ref 12.0–16.0)
Neutrophils Absolute: 4208
Platelets: 232 (ref 150–399)
WBC: 6.7

## 2020-03-22 LAB — CBC: RBC: 3.02 — AB (ref 3.87–5.11)

## 2020-03-29 DIAGNOSIS — D1801 Hemangioma of skin and subcutaneous tissue: Secondary | ICD-10-CM | POA: Diagnosis not present

## 2020-03-29 DIAGNOSIS — Z85828 Personal history of other malignant neoplasm of skin: Secondary | ICD-10-CM | POA: Diagnosis not present

## 2020-03-29 DIAGNOSIS — L570 Actinic keratosis: Secondary | ICD-10-CM | POA: Diagnosis not present

## 2020-03-29 DIAGNOSIS — L814 Other melanin hyperpigmentation: Secondary | ICD-10-CM | POA: Diagnosis not present

## 2020-04-05 DIAGNOSIS — I10 Essential (primary) hypertension: Secondary | ICD-10-CM | POA: Diagnosis not present

## 2020-04-05 DIAGNOSIS — Q6689 Other  specified congenital deformities of feet: Secondary | ICD-10-CM | POA: Diagnosis not present

## 2020-04-05 DIAGNOSIS — M79672 Pain in left foot: Secondary | ICD-10-CM | POA: Diagnosis not present

## 2020-04-05 DIAGNOSIS — L602 Onychogryphosis: Secondary | ICD-10-CM | POA: Diagnosis not present

## 2020-04-05 DIAGNOSIS — E876 Hypokalemia: Secondary | ICD-10-CM | POA: Diagnosis not present

## 2020-04-05 DIAGNOSIS — M79671 Pain in right foot: Secondary | ICD-10-CM | POA: Diagnosis not present

## 2020-04-05 LAB — BASIC METABOLIC PANEL
BUN: 27 — AB (ref 4–21)
CO2: 28 — AB (ref 13–22)
Chloride: 97 — AB (ref 99–108)
Creatinine: 0.9 (ref 0.5–1.1)
Glucose: 77
Potassium: 5.1 (ref 3.4–5.3)
Sodium: 133 — AB (ref 137–147)

## 2020-04-05 LAB — COMPREHENSIVE METABOLIC PANEL: Calcium: 8.9 (ref 8.7–10.7)

## 2020-04-19 DIAGNOSIS — I10 Essential (primary) hypertension: Secondary | ICD-10-CM | POA: Diagnosis not present

## 2020-04-19 DIAGNOSIS — E876 Hypokalemia: Secondary | ICD-10-CM | POA: Diagnosis not present

## 2020-04-19 LAB — BASIC METABOLIC PANEL
BUN: 27 — AB (ref 4–21)
CO2: 30 — AB (ref 13–22)
Chloride: 97 — AB (ref 99–108)
Creatinine: 1 (ref 0.5–1.1)
Glucose: 84
Potassium: 5.3 (ref 3.4–5.3)
Sodium: 131 — AB (ref 137–147)

## 2020-04-19 LAB — COMPREHENSIVE METABOLIC PANEL: Calcium: 8.6 — AB (ref 8.7–10.7)

## 2020-05-02 DIAGNOSIS — H401111 Primary open-angle glaucoma, right eye, mild stage: Secondary | ICD-10-CM | POA: Diagnosis not present

## 2020-05-02 DIAGNOSIS — H353131 Nonexudative age-related macular degeneration, bilateral, early dry stage: Secondary | ICD-10-CM | POA: Diagnosis not present

## 2020-05-02 DIAGNOSIS — H401122 Primary open-angle glaucoma, left eye, moderate stage: Secondary | ICD-10-CM | POA: Diagnosis not present

## 2020-05-10 ENCOUNTER — Encounter: Payer: Self-pay | Admitting: Internal Medicine

## 2020-05-10 ENCOUNTER — Non-Acute Institutional Stay: Payer: Medicare HMO | Admitting: Internal Medicine

## 2020-05-10 DIAGNOSIS — D649 Anemia, unspecified: Secondary | ICD-10-CM

## 2020-05-10 DIAGNOSIS — R1032 Left lower quadrant pain: Secondary | ICD-10-CM

## 2020-05-10 DIAGNOSIS — E1140 Type 2 diabetes mellitus with diabetic neuropathy, unspecified: Secondary | ICD-10-CM | POA: Diagnosis not present

## 2020-05-10 DIAGNOSIS — I10 Essential (primary) hypertension: Secondary | ICD-10-CM

## 2020-05-10 NOTE — Progress Notes (Signed)
Location:   [place] Room Number: [place] of Service:  ALF [phone]) Provider:  Veleta Miners MD   Virgie Dad, MD  Patient Care Team: Virgie Dad, MD as PCP - General (Internal Medicine) Lanelle Bal., MD as Referring Physician (Internal Medicine) Satira Sark, MD as Consulting Physician (Cardiology) Kyung Rudd, MD as Consulting Physician (Radiation Oncology) Mast, Man X, NP as Nurse Practitioner (Internal Medicine)  Extended Emergency Contact Information Primary Emergency Contact: Neita Garnet of [place] Phone: [phone] Relation: Son Secondary Emergency Contact: Eichinger,Lance Address: [address]          [place], [place] [address] [name] of [place] Phone: [phone] Mobile Phone: [phone] Relation: Son  Code Status:  DNR Goals of care: Advanced Directive information Advanced Directives 05/10/2020  Does Patient Have a Medical Advance Directive? Yes  Type of Advance Directive Living will;Healthcare Power of [place];Out of facility DNR (pink MOST or yellow form)  Does patient want to make changes to medical advance directive? No - Patient declined  Copy of [place] in Chart? Yes - validated most recent copy scanned in chart (See row information)  Would patient like information on creating a medical advance directive? -  Pre-existing out of facility DNR order (yellow form or pink MOST form) Pink MOST form placed in chart (order not valid for inpatient use)     Chief Complaint  Patient presents with  . Acute Visit    Left lower quadrant abdominal pain     HPI:  Pt is a 86 y.o. female seen today for an acute visit for Lower Left Quadrant Pain  Patient has a history of diastolic CHF, hyponatremia, hypertension, diabetes mellitus, PSVT and history of breast cancer and lung cancer,.Iron Def Anemiaon Oxygen PRN,Left Sided Pleural Effusion  [place] lives in [place] Has been  c/o Pain in Left Lower quadrant for last few days. No Nausea. No Vomiting. No Fever No Diarrhea. Has taken Tylenol. Says it comes when she Tightens her muscles to get up from the bed Not sure if she is having good bowel movements. No Dysuria  Past Medical History:  Diagnosis Date  . Allergic rhinitis   . Anemia    NOS  . Anxiety   . Biceps tendon rupture    Bilateral  . Breast cancer ([place]) 2001   Left s/p lumpectomy and XRT   . Bronchiectasis ([place]) 2014   Noted on chest x-ray  . Complication of anesthesia    slow to wake up  . Cough 2014  . Depression   . Diverticulosis   . Elevated liver enzymes    Biopsy consistent with steatohepatitis  . Fundic gland polyps of stomach, benign   . GERD (gastroesophageal reflux disease) 06/2007   EGD Dr Gala Romney >sm HH, multiple fundic gland polyps  . Glaucoma 2013   both eyes  . Hemorrhoids   . Hiatal hernia   . Hx of radiation therapy 07/17/10 to 07/21/10   SRS LLL lung  . Hyperlipidemia   . Insomnia   . Low back pain    scoliosis  . Lung cancer ([place]) 05/2010   Left 2011 - rad x 3  . Lymphedema    Left arm  . Osteoarthritis   . Osteopenia 10/25/2016  . Osteoporosis, senile   . Overactive bladder   . Pneumonia    RLL with sepsis  . PSVT (paroxysmal supraventricular tachycardia) ([place])   . Rheumatic fever  Age 57  . Right thyroid nodule   . Steatohepatitis    liver bx  . Type 2 diabetes mellitus ([place])    Past Surgical History:  Procedure Laterality Date  . ABDOMINAL HYSTERECTOMY  1964  . APPENDECTOMY  1964  . BIOPSY THYROID  11/2008  . BREAST BIOPSY     X 3 w/ cystectomy  . BREAST LUMPECTOMY  2001  . CATARACT EXTRACTION Bilateral 2003   Lens implant Dr. Charise Killian  . CHOLECYSTECTOMY  1965  . COLONOSCOPY  09/11/2011   Procedure: COLONOSCOPY;  Surgeon: Dorothyann Peng, MD;  [facility];  Service: Endoscopy;  Laterality: N/A;  . COLONOSCOPY  2005 /2012   Dr. Lucianne Muss- diverticulosis, ext hemorrhoids.  . COLONOSCOPY WITH  PROPOFOL N/A 11/09/2017   Procedure: COLONOSCOPY WITH PROPOFOL;  Surgeon: Mauri Pole, MD;  [facility];  Service: Endoscopy;  Laterality: N/A;  . COLONOSCOPY WITH PROPOFOL N/A 11/12/2017   Procedure: COLONOSCOPY WITH PROPOFOL;  Surgeon: Ladene Artist, MD;  [facility];  Service: Endoscopy;  Laterality: N/A;  . CYSTOSCOPY  2007  . ESOPHAGOGASTRODUODENOSCOPY  09/11/2011   Procedure: ESOPHAGOGASTRODUODENOSCOPY (EGD);  Surgeon: Dorothyann Peng, MD;  [facility];  Service: Endoscopy;  Laterality: N/A;  . ESOPHAGOGASTRODUODENOSCOPY (EGD) WITH PROPOFOL N/A 11/08/2017   Procedure: ESOPHAGOGASTRODUODENOSCOPY (EGD) WITH PROPOFOL;  Surgeon: Mauri Pole, MD;  [facility];  Service: Endoscopy;  Laterality: N/A;  . LIVER BIOPSY  2008  . LUNG BIOPSY Left 06/06/2010  . REVISION TOTAL HIP ARTHROPLASTY  2007   Left  . SKIN CANCER EXCISION     nose and left lower cheek  . TONSILLECTOMY  1930  . TOTAL HIP ARTHROPLASTY Bilateral 2005 & 2007    Dr. Earl Lagos. Aline Brochure     Allergies  Allergen Reactions  . Augmentin [Amoxicillin-Pot Clavulanate] Other (See Comments)    unknown  . Ibuprofen Hives  . Naproxen Other (See Comments)    unknown  . Statins Other (See Comments)    Feels like knives in stomach  . Surgilube [Gyne-Moistrin]     Rectal itching, burning  . Tape Rash    Allergies as of 05/10/2020      Reactions   Augmentin [amoxicillin-pot Clavulanate] Other (See Comments)   unknown   Ibuprofen Hives   Naproxen Other (See Comments)   unknown   Statins Other (See Comments)   Feels like knives in stomach   Surgilube [gyne-moistrin]    Rectal itching, burning   Tape Rash      Medication List       Accurate as of May 10, 2020  3:37 PM. If you have any questions, ask your nurse or doctor.        STOP taking these medications   ferrous gluconate 240 (27 FE) MG tablet Commonly known as: FERGON Stopped by: Virgie Dad,  MD     TAKE these medications   acetaminophen 500 MG tablet Commonly known as: TYLENOL Take 500 mg by mouth 2 (two) times daily.   atenolol 25 MG tablet Commonly known as: TENORMIN Take 1 tablet (25 mg total) by mouth daily.   B Complex-B12 Tabs Take 1 tablet by mouth daily.   diphenhydrAMINE 25 mg capsule Commonly known as: BENADRYL Take 25 mg by mouth daily as needed.   DULoxetine 30 MG capsule Commonly known as: CYMBALTA Take 1 capsule (30 mg total) by mouth daily.   feeding supplement (BOOST BREEZE) Liqd Take 237 mLs by mouth daily. orange  flavor   ferrous sulfate 220 (44 Fe) MG/5ML solution Take 220 mg by mouth daily. 5 mL by mouth daily with meal Once A Day   folic acid 545 MCG tablet Commonly known as: FOLVITE Take 1 tablet (400 mcg total) by mouth daily.   gabapentin 100 MG capsule Commonly known as: NEURONTIN Take 1 capsule (100 mg total) by mouth 2 (two) times daily.   Imvexxy Maintenance Pack 4 MCG Inst Generic drug: Estradiol Place 4 mcg vaginally once a week. On Thursday   latanoprost 0.005 % ophthalmic solution Commonly known as: XALATAN Place 1 drop into both eyes at bedtime.   magnesium oxide 400 (241.3 Mg) MG tablet Commonly known as: MAG-OX Take 1 tablet (400 mg total) by mouth 2 (two) times daily.   omeprazole 20 MG capsule Commonly known as: PRILOSEC Take 1 capsule (20 mg total) by mouth daily.   PreserVision AREDS 2 Caps Take 1 capsule by mouth 2 (two) times daily.   simethicone 80 MG chewable tablet Commonly known as: MYLICON Chew 80 mg by mouth 4 (four) times daily as needed for flatulence.   sodium chloride 1 g tablet Take 1 g by mouth daily.       Review of Systems  Constitutional: Negative.   HENT: Negative.   Respiratory: Negative.   Cardiovascular: Negative.   Gastrointestinal: Positive for abdominal pain, constipation and diarrhea.  Genitourinary: Negative.   Musculoskeletal: Positive for gait problem.  Skin:  Negative.   Psychiatric/Behavioral: Negative.     Immunization History  Administered Date(s) Administered  . Influenza Whole 09/22/2008, 09/16/2012, 09/30/2013  . Influenza, High Dose Seasonal PF 09/25/2017, 09/19/2019  . Influenza,inj,Quad PF,6+ Mos 10/09/2018  . Influenza-Unspecified 10/02/2014, 09/15/2015, 09/27/2016  . Moderna SARS-COVID-2 Vaccination 12/19/2019, 01/16/2020  . Pneumococcal Conjugate-13 11/29/2017  . Pneumococcal Polysaccharide-23 08/17/2006  . Td 12/17/2005   Pertinent  Health Maintenance Due  Topic Date Due  . FOOT EXAM  Never done  . URINE MICROALBUMIN  04/30/2008  . HEMOGLOBIN A1C  10/17/2017  . OPHTHALMOLOGY EXAM  12/27/2019  . INFLUENZA VACCINE  07/17/2020  . DEXA SCAN  Completed  . PNA vac Low Risk Adult  Completed   Fall Risk  10/07/2018 09/19/2017 05/17/2016 04/20/2016 04/19/2016  Falls in the past year? Yes No No No No  Number falls in past yr: 1 - - - -  Injury with Fall? No - - - -   Functional Status Survey:    Vitals:   05/10/20 1530  BP: 136/70  Pulse: 60  Resp: 14  Temp: (!) 97.1 F (36.2 C)  SpO2: 93%  Weight: 143 lb 9.6 oz (65.1 kg)  Height: 5' 4"  (1.626 m)   Body mass index is 24.65 kg/m. Physical Exam Constitutional: Oriented to person, place, and time. Well-developed and well-nourished.  HENT:  Head: Normocephalic.  Mouth/Throat: Oropharynx is clear and moist.  Eyes: Pupils are equal, round, and reactive to light.  Neck: Neck supple.  Cardiovascular: Normal rate and normal heart sounds.  No murmur heard. Pulmonary/Chest: Effort normal and breath sounds normal. No respiratory distress. No wheezes. She has no rales.  Abdominal: Soft. Bowel sounds are normal. No distension. There is no tenderness. There is no rebound.  Musculoskeletal: No edema.  Lymphadenopathy: none Neurological: Alert and oriented to person, place, and time.  Skin: Skin is warm and dry.  Psychiatric: Normal mood and affect. Behavior is normal. Thought  content normal.   Labs reviewed: Recent Labs    03/08/20 0000 04/05/20 0000 04/19/20 0000  NA 132* 133* 131*  K 4.7 5.1 5.3  CL 96* 97* 97*  CO2 30* 28* 30*  BUN 23* 27* 27*  CREATININE 0.8 0.9 1.0  CALCIUM 8.9 8.9 8.6*   No results for input(s): AST, ALT, ALKPHOS, BILITOT, PROT, ALBUMIN in the last 8760 hours. Recent Labs    03/22/20 0000  WBC 6.7  NEUTROABS 4,208  HGB 9.9*  HCT 30*  PLT 232   Lab Results  Component Value Date   TSH 1.964 02/21/2019   Lab Results  Component Value Date   HGBA1C 5.3 04/16/2017   Lab Results  Component Value Date   CHOL 188 04/12/2016   HDL 62 04/12/2016   LDLCALC 102 04/12/2016   TRIG 122 04/12/2016   CHOLHDL 3.0 Ratio 06/07/2008    Significant Diagnostic Results in last 30 days:  No results found.  Assessment/Plan .Left lower quadrant pain ? Etiology Will get Abdominal Xray to rule out constipation Recently had Iron changed to Liquid  Anemia, unspecified type On Iron ? Etiology Will repeat CBC in few weeks Type 2 diabetes mellitus with diabetic neuropathy,  Not on any Meds Will check A1C  Hyponatremia On Sodium tabs Sodium stable  Depression with anxiety Stable on Cymbalta Neuropathy Uses Electric chair. Independent in her transfers On Neurontin PSVT (paroxysmal supraventricular tachycardia) ([place]) Doing well on Tenormin Family/ staff Communication:   Labs/tests ordered:

## 2020-05-11 DIAGNOSIS — R109 Unspecified abdominal pain: Secondary | ICD-10-CM | POA: Diagnosis not present

## 2020-05-17 DIAGNOSIS — Z79899 Other long term (current) drug therapy: Secondary | ICD-10-CM | POA: Diagnosis not present

## 2020-05-17 DIAGNOSIS — D500 Iron deficiency anemia secondary to blood loss (chronic): Secondary | ICD-10-CM | POA: Diagnosis not present

## 2020-05-17 DIAGNOSIS — E1140 Type 2 diabetes mellitus with diabetic neuropathy, unspecified: Secondary | ICD-10-CM | POA: Diagnosis not present

## 2020-05-17 DIAGNOSIS — E871 Hypo-osmolality and hyponatremia: Secondary | ICD-10-CM | POA: Diagnosis not present

## 2020-05-17 DIAGNOSIS — I270 Primary pulmonary hypertension: Secondary | ICD-10-CM | POA: Diagnosis not present

## 2020-05-17 DIAGNOSIS — I10 Essential (primary) hypertension: Secondary | ICD-10-CM | POA: Diagnosis not present

## 2020-05-17 DIAGNOSIS — E876 Hypokalemia: Secondary | ICD-10-CM | POA: Diagnosis not present

## 2020-05-17 LAB — BASIC METABOLIC PANEL
BUN: 24 — AB (ref 4–21)
CO2: 30 — AB (ref 13–22)
Chloride: 95 — AB (ref 99–108)
Creatinine: 0.9 (ref 0.5–1.1)
Glucose: 82
Potassium: 4.9 (ref 3.4–5.3)
Sodium: 131 — AB (ref 137–147)

## 2020-05-17 LAB — COMPREHENSIVE METABOLIC PANEL: Calcium: 9.3 (ref 8.7–10.7)

## 2020-05-18 LAB — CBC AND DIFFERENTIAL
HCT: 33 — AB (ref 36–46)
Hemoglobin: 11 — AB (ref 12.0–16.0)
Neutrophils Absolute: 4179
Platelets: 258 (ref 150–399)
WBC: 6.2

## 2020-05-18 LAB — CBC: RBC: 3.38 — AB (ref 3.87–5.11)

## 2020-05-18 LAB — HEMOGLOBIN A1C: Hemoglobin A1C: 5.3

## 2020-06-21 DIAGNOSIS — I10 Essential (primary) hypertension: Secondary | ICD-10-CM | POA: Diagnosis not present

## 2020-06-21 DIAGNOSIS — E876 Hypokalemia: Secondary | ICD-10-CM | POA: Diagnosis not present

## 2020-06-21 LAB — BASIC METABOLIC PANEL
BUN: 24 — AB (ref 4–21)
CO2: 30 — AB (ref 13–22)
Chloride: 98 — AB (ref 99–108)
Creatinine: 0.9 (ref 0.5–1.1)
Glucose: 83
Potassium: 5 (ref 3.4–5.3)
Sodium: 133 — AB (ref 137–147)

## 2020-06-21 LAB — COMPREHENSIVE METABOLIC PANEL: Calcium: 8.7 (ref 8.7–10.7)

## 2020-07-11 ENCOUNTER — Emergency Department (HOSPITAL_COMMUNITY)
Admission: EM | Admit: 2020-07-11 | Discharge: 2020-07-11 | Disposition: A | Discharge status: Home / Self Care | Payer: Medicare HMO | Attending: Emergency Medicine | Admitting: Emergency Medicine

## 2020-07-11 ENCOUNTER — Emergency Department (HOSPITAL_COMMUNITY): Payer: Medicare HMO

## 2020-07-11 ENCOUNTER — Telehealth: Payer: Self-pay

## 2020-07-11 ENCOUNTER — Other Ambulatory Visit: Payer: Self-pay

## 2020-07-11 DIAGNOSIS — I130 Hypertensive heart and chronic kidney disease with heart failure and stage 1 through stage 4 chronic kidney disease, or unspecified chronic kidney disease: Secondary | ICD-10-CM | POA: Insufficient documentation

## 2020-07-11 DIAGNOSIS — Z79899 Other long term (current) drug therapy: Secondary | ICD-10-CM | POA: Diagnosis not present

## 2020-07-11 DIAGNOSIS — I5032 Chronic diastolic (congestive) heart failure: Secondary | ICD-10-CM | POA: Diagnosis not present

## 2020-07-11 DIAGNOSIS — Z85118 Personal history of other malignant neoplasm of bronchus and lung: Secondary | ICD-10-CM | POA: Diagnosis not present

## 2020-07-11 DIAGNOSIS — Z7401 Bed confinement status: Secondary | ICD-10-CM | POA: Diagnosis not present

## 2020-07-11 DIAGNOSIS — E119 Type 2 diabetes mellitus without complications: Secondary | ICD-10-CM | POA: Insufficient documentation

## 2020-07-11 DIAGNOSIS — Z853 Personal history of malignant neoplasm of breast: Secondary | ICD-10-CM | POA: Diagnosis not present

## 2020-07-11 DIAGNOSIS — I4891 Unspecified atrial fibrillation: Secondary | ICD-10-CM | POA: Diagnosis not present

## 2020-07-11 DIAGNOSIS — M2550 Pain in unspecified joint: Secondary | ICD-10-CM | POA: Diagnosis not present

## 2020-07-11 DIAGNOSIS — M25572 Pain in left ankle and joints of left foot: Secondary | ICD-10-CM | POA: Diagnosis not present

## 2020-07-11 DIAGNOSIS — N183 Chronic kidney disease, stage 3 unspecified: Secondary | ICD-10-CM | POA: Diagnosis not present

## 2020-07-11 DIAGNOSIS — M25551 Pain in right hip: Secondary | ICD-10-CM | POA: Diagnosis not present

## 2020-07-11 DIAGNOSIS — Z471 Aftercare following joint replacement surgery: Secondary | ICD-10-CM | POA: Diagnosis not present

## 2020-07-11 DIAGNOSIS — Z96643 Presence of artificial hip joint, bilateral: Secondary | ICD-10-CM | POA: Diagnosis not present

## 2020-07-11 DIAGNOSIS — R0902 Hypoxemia: Secondary | ICD-10-CM | POA: Diagnosis not present

## 2020-07-11 DIAGNOSIS — R52 Pain, unspecified: Secondary | ICD-10-CM | POA: Diagnosis not present

## 2020-07-11 MED ORDER — TRAMADOL HCL 50 MG PO TABS
50.0000 mg | ORAL_TABLET | Freq: Once | ORAL | Status: AC
  Administered 2020-07-11: 50 mg via ORAL
  Filled 2020-07-11: qty 1

## 2020-07-11 MED ORDER — TRAMADOL HCL 50 MG PO TABS: 25.0000 mg | ORAL_TABLET | Freq: Four times a day (QID) | ORAL | 0 refills | Status: DC | PRN

## 2020-07-11 MED ORDER — ONDANSETRON 4 MG PO TBDP
4.0000 mg | ORAL_TABLET | Freq: Once | ORAL | Status: AC
  Administered 2020-07-11: 4 mg via ORAL
  Filled 2020-07-11: qty 1

## 2020-07-11 NOTE — Telephone Encounter (Signed)
Patient called stating that she is having pain in her hip and she would like to have an order to get a shot like was done previously. She stated that it had been painful since Saturday and she really needs some help. Please advise.

## 2020-07-11 NOTE — ED Provider Notes (Signed)
[facility] DEPT Provider Note   CSN: [id number] Arrival date & time: 07/11/20  1724     History Chief Complaint  Patient presents with  . Hip Pain    Right    [name] is a 86 y.o. female.  Who presents emergency department chief complaint of right hip pain.  Patient states that her symptoms began yesterday.  She states that she had some aching in the joint which is worse with movement better with rest. She describes the pain as aching, constant, colicky and at times very sharp.  She has history of bilateral hip replacement.  She denies any injury.  She denies history of subluxation or dislocation. She is ambulatory but with severe pain.  Yesterday she was able to ambulate with Tylenol.  Today her pain was worse.  She denies fevers, chills, urinary symptoms.  Emotional borderline personality  HPI     Past Medical History:  Diagnosis Date  . Allergic rhinitis   . Anemia    NOS  . Anxiety   . Biceps tendon rupture    Bilateral  . Breast cancer ([place]) 2001   Left s/p lumpectomy and XRT   . Bronchiectasis ([place]) 2014   Noted on chest x-ray  . Complication of anesthesia    slow to wake up  . Cough 2014  . Depression   . Diverticulosis   . Elevated liver enzymes    Biopsy consistent with steatohepatitis  . Fundic gland polyps of stomach, benign   . GERD (gastroesophageal reflux disease) 06/2007   EGD Dr Gala Romney >sm HH, multiple fundic gland polyps  . Glaucoma 2013   both eyes  . Hemorrhoids   . Hiatal hernia   . Hx of radiation therapy 07/17/10 to 07/21/10   SRS LLL lung  . Hyperlipidemia   . Insomnia   . Low back pain    scoliosis  . Lung cancer ([place]) 05/2010   Left 2011 - rad x 3  . Lymphedema    Left arm  . Osteoarthritis   . Osteopenia 10/25/2016  . Osteoporosis, senile   . Overactive bladder   . Pneumonia    RLL with sepsis  . PSVT (paroxysmal supraventricular tachycardia) ([place])   . Rheumatic fever    Age 62  . Right thyroid  nodule   . Steatohepatitis    liver bx  . Type 2 diabetes mellitus [facility])     Patient Active Problem List   Diagnosis Date Noted  . T2DM (type 2 diabetes mellitus) ([place]) 03/16/2020  . Postmenopausal symptoms 03/16/2020  . CKD (chronic kidney disease) stage 3, GFR 30-59 ml/min 07/29/2019  . Pain in gums 07/22/2019  . Amaurosis fugax of right eye 01/15/2019  . Glaucoma 01/14/2019  . Macular degeneration 01/14/2019  . Chronic diastolic CHF (congestive heart failure) ([place]) 08/13/2018  . Pulmonary hypertension, primary ([place]) 07/31/2018  . Neuropathy 06/20/2018  . Paroxysmal atrial fibrillation ([place]) 06/20/2018  . Iron deficiency 01/21/2018  . Fall 01/09/2018  . Advanced care planning/counseling discussion 11/20/2017  . Melena   . Diverticulosis of colon with hemorrhage   . Hyponatremia 07/09/2015  . Hearing loss of both ears 04/08/2014  . HTN (hypertension) 03/27/2014  . Skin lesion of face 03/27/2014  . Rotator cuff syndrome of right shoulder 09/30/2012  . Breast cancer ([place])   . Lung cancer, lower lobe ([place]) 10/25/2011  . PSVT (paroxysmal supraventricular tachycardia) ([place]) 03/29/2011  . INTERDIGITAL NEUROMA 03/28/2010  . IMPINGEMENT SYNDROME 03/28/2010  . THYROID  NODULE, RIGHT 09/06/2008  . LEG EDEMA, BILATERAL 12/22/2007  . Depression with anxiety 07/31/2007  . Other specified disorders of adrenal gland ([place]) 05/14/2007  . Hyperlipidemia 01/28/2007  . Blood loss anemia 01/28/2007  . Allergic rhinitis 01/28/2007  . GERD (gastroesophageal reflux disease) 01/28/2007  . OVERACTIVE BLADDER 01/28/2007  . Osteoarthritis 01/28/2007  . Pain in lower back 01/28/2007  . Osteoporosis 01/28/2007    Past Surgical History:  Procedure Laterality Date  . ABDOMINAL HYSTERECTOMY  1964  . APPENDECTOMY  1964  . BIOPSY THYROID  11/2008  . BREAST BIOPSY     X 3 w/ cystectomy  . BREAST LUMPECTOMY  2001  . CATARACT EXTRACTION Bilateral 2003   Lens implant Dr. Charise Killian  . CHOLECYSTECTOMY   1965  . COLONOSCOPY  09/11/2011   Procedure: COLONOSCOPY;  Surgeon: Dorothyann Peng, MD;  Location[facility];  Service: Endoscopy;  Laterality: N/A;  . COLONOSCOPY  2005 /2012   Dr. Lucianne Muss- diverticulosis, ext hemorrhoids.  . COLONOSCOPY WITH PROPOFOL N/A 11/09/2017   Procedure: COLONOSCOPY WITH PROPOFOL;  Surgeon: Mauri Pole, MD;  Location: [facility] END[facility];  Service: Endoscopy;  Laterality: N/A;  . COLONOSCOPY WITH PROPOFOL N/A 11/12/2017   Procedure: COLONOSCOPY WITH PROPOFOL;  Surgeon: Ladene Artist, MD;  Location: [facility] ENDOS[facility];  Service: Endoscopy;  Laterality: N/A;  . CYSTOSCOPY  2007  . ESOPHAGOGASTRODUODENOSCOPY  09/11/2011   Procedure: ESOPHAGOGASTRODUODENOSCOPY (EGD);  Surgeon: Dorothyann Peng, MD;  Location: [facility];  Service: Endoscopy;  Laterality: N/A;  . ESOPHAGOGASTRODUODENOSCOPY (EGD) WITH PROPOFOL N/A 11/08/2017   Procedure: ESOPHAGOGASTRODUODENOSCOPY (EGD) WITH PROPOFOL;  Surgeon: Mauri Pole, MD;  Location: [facility];  Service: Endoscopy;  Laterality: N/A;  . LIVER BIOPSY  2008  . LUNG BIOPSY Left 06/06/2010  . REVISION TOTAL HIP ARTHROPLASTY  2007   Left  . SKIN CANCER EXCISION     nose and left lower cheek  . TONSILLECTOMY  1930  . TOTAL HIP ARTHROPLASTY Bilateral 2005 & 2007    Dr. Earl Lagos. Aline Brochure      OB History   No obstetric history on file.     Family History  Problem Relation Age of Onset  . Heart failure Mother   . Osteoporosis Mother   . Hypertension Father   . Stroke Father   . Heart failure Father   . Leukemia Brother   . Heart failure Brother   . Cancer Maternal Grandmother   . Stroke Maternal Grandfather   . Cancer Other     Social History   Tobacco Use  . Smoking status: Never Smoker  . Smokeless tobacco: Never Used  . Tobacco comment: 2nd hand-husband  Vaping Use  . Vaping Use: Never used  Substance Use Topics  . Alcohol use: No  . Drug use: No    Home Medications Prior to Admission medications    Medication Sig Start Date End Date Taking? Authorizing Provider  acetaminophen (TYLENOL) 500 MG tablet Take 500 mg by mouth 2 (two) times daily.     [provider]  atenolol (TENORMIN) 25 MG tablet Take 1 tablet (25 mg total) by mouth daily. 03/05/19   [place] Donnamarie Poag, DO  B Complex Vitamins (B COMPLEX-B12) TABS Take 1 tablet by mouth daily. 03/05/19   [place] Donnamarie Poag, DO  diphenhydrAMINE (BENADRYL) 25 mg capsule Take 25 mg by mouth daily as needed.     [provider]  DULoxetine (CYMBALTA) 30 MG capsule Take 1 capsule (30 mg total) by mouth daily. 03/05/19  [place] Donnamarie Poag, DO  Estradiol (IMVEXXY MAINTENANCE PACK) 4 MCG INST Place 4 mcg vaginally once a week. On Thursday    [provider]  ferrous sulfate 220 (44 Fe) MG/5ML solution Take 220 mg by mouth daily. 5 mL by mouth daily with meal Once A Day    [provider]  folic acid (FOLVITE) 621 MCG tablet Take 1 tablet (400 mcg total) by mouth daily. 03/05/19   [place] Donnamarie Poag, DO  gabapentin (NEURONTIN) 100 MG capsule Take 1 capsule (100 mg total) by mouth 2 (two) times daily. 03/05/19   [place] Eric J, DO  latanoprost (XALATAN) 0.005 % ophthalmic solution Place 1 drop into both eyes at bedtime. 03/05/19   [place] Donnamarie Poag, DO  magnesium oxide (MAG-OX) 400 (241.3 Mg) MG tablet Take 1 tablet (400 mg total) by mouth 2 (two) times daily. 03/05/19   [place] Donnamarie Poag, DO  Multiple Vitamins-Minerals (PRESERVISION AREDS 2) CAPS Take 1 capsule by mouth 2 (two) times daily.    [provider]  Nutritional Supplements (FEEDING SUPPLEMENT, BOOST BREEZE,) LIQD Take 237 mLs by mouth daily. orange flavor    [provider]  omeprazole (PRILOSEC) 20 MG capsule Take 1 capsule (20 mg total) by mouth daily. 03/05/19   [place] Donnamarie Poag, DO  simethicone (MYLICON) 80 MG chewable tablet Chew 80 mg by mouth 4 (four) times daily as needed for flatulence.    [provider]  sodium chloride 1 g tablet Take 1 g by  mouth daily.    [provider]    Allergies    Augmentin [amoxicillin-pot clavulanate], Ibuprofen, Naproxen, Statins, Surgilube [gyne-moistrin], and Tape  Review of Systems   Review of Systems Ten systems reviewed and are negative for acute change, except as noted in the HPI.   Physical Exam Updated Vital Signs BP (!) 167/92 (BP Location: Right Arm)   Pulse 79   Temp 97.9 F (36.6 C) (Oral)   Resp 18   SpO2 94%   Physical Exam Vitals and nursing note reviewed.  Constitutional:      General: She is not in acute distress.    Appearance: She is well-developed. She is not diaphoretic.  HENT:     Head: Normocephalic and atraumatic.  Eyes:     General: No scleral icterus.    Conjunctiva/sclera: Conjunctivae normal.  Cardiovascular:     Rate and Rhythm: Normal rate and regular rhythm.     Heart sounds: Normal heart sounds. No murmur heard.  No friction rub. No gallop.   Pulmonary:     Effort: Pulmonary effort is normal. No respiratory distress.     Breath sounds: Normal breath sounds.  Abdominal:     General: Bowel sounds are normal. There is no distension.     Palpations: Abdomen is soft. There is no mass.     Tenderness: There is no abdominal tenderness. There is no guarding.  Musculoskeletal:     Cervical back: Normal range of motion.     Comments: Pain with passive range of motion of the right hip especially with external rotation.  Normal strength.  DP and PT pulse intact.  Normal ipsilateral knee and ankle  Skin:    General: Skin is warm and dry.  Neurological:     Mental Status: She is alert and oriented to person, place, and time.  Psychiatric:        Behavior: Behavior normal.     ED Results / Procedures / Treatments   Labs (all labs  ordered are listed, but only abnormal results are displayed) Labs Reviewed - No data to display  EKG None  Radiology DG Hips Bilat W or Wo Pelvis 3-4 Views  Result Date: 07/11/2020 CLINICAL DATA:  86 year old  female with right rib pain for 2 days. No known injury. EXAM: DG HIP (WITH OR WITHOUT PELVIS) 3-4V BILAT COMPARISON:  Hip series 01/16/2018. FINDINGS: Chronic bilateral total hip arthroplasty. On the right hardware appears stable since 2019 and intact. Likewise on the left hardware appears stable and intact. Underlying osteopenia. Stable appearance of the pelvis, chronic left pubic rami deformities. Chronic SI joint degeneration. Partially visible chronic lumbar spine degeneration. Negative lower abdominal and pelvic visceral contours. IMPRESSION: Chronic bilateral total hip arthroplasty appears stable since 2019. No acute osseous abnormality identified. Electronically Signed   By: Genevie Ann M.D.   On: 07/11/2020 18:56    Procedures Procedures (including critical care time)  Medications Ordered in ED Medications - No data to display  ED Course  I have reviewed the triage vital signs and the nursing notes.  Pertinent labs & imaging results that were available during my care of the patient were reviewed by me and considered in my medical decision making (see chart for details).  Clinical Course as of Jul 11 2013  Mon Jul 12, 4415  [id number] 86 year old female here with right hip pain x2 days.  No history of trauma and has bilateral replacements.  Imaging showing no acute fractures.  Likely will return to facility and have them work with her on some physical therapy   [MB]    Clinical Course User Index [MB] Hayden Rasmussen, MD   MDM Rules/Calculators/A&P                          86 year old female here with atraumatic right hip pain.  Seen and shared visit with Dr. Melina Copa.  I personally reviewed bilateral hip images on plain film.  No obvious abnormalities.  Patient will be given pain control.  She [facility].  Patient appears otherwise appropriate for discharge. Final Clinical Impression(s) / ED Diagnoses Final diagnoses:  None    Rx / DC Orders ED  Discharge Orders    None       Margarita Mail, PA-C 07/11/20 2305    Hayden Rasmussen, MD 07/12/20 1227

## 2020-07-11 NOTE — ED Notes (Signed)
Called [facility] for transport. Paperwork printed at bedside.

## 2020-07-11 NOTE — Telephone Encounter (Signed)
Patient notified and will pull her medical cord for the nurse.

## 2020-07-11 NOTE — Telephone Encounter (Signed)
The patient resides in [place], she can ask her nurse to assess her pain and call me if needed.  I am at [place] presently, Janett Billow will round [place] and I will be at [facility].

## 2020-07-11 NOTE — ED Triage Notes (Addendum)
Pt arrived via [facility] from [facility] CC R hip pain X 2 days. Pt denies falls, injury or trauma to area. Per EMS A/OX4 stand to assist and WC transfer.    Hx HipReplacement bilateral      [facility] paperwork says DNR but no yellow or pink form present.

## 2020-07-11 NOTE — Discharge Instructions (Addendum)
Contact a health care provider if: You cannot put weight on your leg. Your pain or swelling continues or gets worse after one week. It gets harder to walk. You have a fever. Get help right away if: You fall. You have a sudden increase in pain and swelling in your hip. Your hip is red or swollen or very tender to touch.

## 2020-07-11 NOTE — ED Notes (Signed)
[facility] at bedside. Pt and all belongings transfered

## 2020-07-11 NOTE — ED Notes (Signed)
Pt verbalizes understanding of DC instructions.

## 2020-07-11 NOTE — ED Notes (Signed)
Report given to Gene RN at [place] "[employer]"

## 2020-07-13 ENCOUNTER — Non-Acute Institutional Stay: Payer: Medicare HMO | Admitting: Nurse Practitioner

## 2020-07-13 ENCOUNTER — Encounter: Payer: Self-pay | Admitting: Nurse Practitioner

## 2020-07-13 DIAGNOSIS — N959 Unspecified menopausal and perimenopausal disorder: Secondary | ICD-10-CM

## 2020-07-13 DIAGNOSIS — G629 Polyneuropathy, unspecified: Secondary | ICD-10-CM

## 2020-07-13 DIAGNOSIS — F418 Other specified anxiety disorders: Secondary | ICD-10-CM

## 2020-07-13 DIAGNOSIS — D500 Iron deficiency anemia secondary to blood loss (chronic): Secondary | ICD-10-CM

## 2020-07-13 DIAGNOSIS — I10 Essential (primary) hypertension: Secondary | ICD-10-CM | POA: Diagnosis not present

## 2020-07-13 DIAGNOSIS — M25551 Pain in right hip: Secondary | ICD-10-CM | POA: Diagnosis not present

## 2020-07-13 DIAGNOSIS — E871 Hypo-osmolality and hyponatremia: Secondary | ICD-10-CM

## 2020-07-13 DIAGNOSIS — K219 Gastro-esophageal reflux disease without esophagitis: Secondary | ICD-10-CM

## 2020-07-13 DIAGNOSIS — R69 Illness, unspecified: Secondary | ICD-10-CM | POA: Diagnosis not present

## 2020-07-13 LAB — MAGNESIUM: Magnesium: 1.8

## 2020-07-13 NOTE — Progress Notes (Signed)
Location:   [place] Room Number: [place] of Service:  ALF [phone]) Provider:  Lorry Anastasi  Virgie Dad, MD  Patient Care Team: Virgie Dad, MD as PCP - General (Internal Medicine) Lanelle Bal., MD as Referring Physician (Internal Medicine) Satira Sark, MD as Consulting Physician (Cardiology) Kyung Rudd, MD as Consulting Physician (Radiation Oncology) Elba Schaber X, NP as Nurse Practitioner (Internal Medicine)  Extended Emergency Contact Information Primary Emergency Contact: Neita Garnet of [place] Phone: [phone] Relation: Son Secondary Emergency Contact: Giovannini,Lance Address: [address]          [place], [place] [address] [name] of [place] Phone: [phone] Mobile Phone: [phone] Relation: Son  Code Status:  DNR Goals of care: Advanced Directive information Advanced Directives 07/11/2020  Does Patient Have a Medical Advance Directive? No  Type of Advance Directive -  Does patient want to make changes to medical advance directive? -  Copy of [place] in Chart? -  Would patient like information on creating a medical advance directive? No - Patient declined  Pre-existing out of facility DNR order (yellow form or pink MOST form) -     Chief Complaint  Patient presents with  . Acute Visit    f/u ED, right hip pain     HPI:  Pt is a 86 y.o. female seen today for an acute visit for ED eval 07/11/20 for the right hip pain, no trauma, s/p R+L hip replacements, X-ray showed no acute fracture.     The patient stated Tramadol helped in ED, pain is constant, will schedule Tramadol 58m bid and q6h prn. X-ray hips/pelvis in ED. F/u Ortho. Therapy to eval/tx 07/14/20 Tramadol 570mq6hr per patient's request 07/13/20, the patient received total 4 doses, no pain relief from Tramadol. Staff reported the patient pain is not controlled, the patient desires of joint inj-pending Ortho 07/20/20,  will try Oxycodone 31m88m4hr prn for pain for now, pain assessment q shift.   Depression/anxiety takes, Duloxetine 31m531m.   Hyponatremia, Na in 130s, chronic, on Salt Tab daily.   GERD, stable, on Omeprazole 20mg53m   Peripheral neuropathy, on Gabapentin 100mg 12m Tylenol 500mg b77m  Anemia, stable, on Fe, Folic acid, Vit B12.   E70t menopausal symptoms, stable, on Estradiol 4mcg wk68m   HTN, blood pressure is controlled, on Atenolol 231mg qd.60mPast Medical History:  Diagnosis Date  . Allergic rhinitis   . Anemia    NOS  . Anxiety   . Biceps tendon rupture    Bilateral  . Breast cancer (HCC) [place] lumpect[place] and XRT   . Bronchiectasis (HCC) 2014[place] HallNoted on chest x-ray  . Complication of anesthesia    slow to wake up  . Cough 2014  . Depression   . Diverticulosis   . Elevated liver enzymes    Biopsy consistent with steatohepatitis  . Fundic gland polyps of stomach, benign   . GERD (gastroesophageal reflux disease) 06/2007   EGD Dr Rourk >smGala Romneymultiple fundic gland polyps  . Glaucoma 2013   both eyes  . Hemorrhoids   . Hiatal hernia   . Hx of radiation therapy 07/17/10 to 07/21/10   SRS LLL lung  . Hyperlipidemia   . Insomnia   . Low back pain    scoliosis  . Lung cancer (HCC) 6/20[place]  Left 2011 - rad x 3  . Lymphedema  Left arm  . Osteoarthritis   . Osteopenia 10/25/2016  . Osteoporosis, senile   . Overactive bladder   . Pneumonia    RLL with sepsis  . PSVT (paroxysmal supraventricular tachycardia) ([place])   . Rheumatic fever    Age 16  . Right thyroid nodule   . Steatohepatitis    liver bx  . Type 2 diabetes mellitus ([place])    Past Surgical History:  Procedure Laterality Date  . ABDOMINAL HYSTERECTOMY  1964  . APPENDECTOMY  1964  . BIOPSY THYROID  11/2008  . BREAST BIOPSY     X 3 w/ cystectomy  . BREAST LUMPECTOMY  2001  . CATARACT EXTRACTION Bilateral 2003   Lens implant Dr. Charise Killian  . CHOLECYSTECTOMY  1965  . COLONOSCOPY  09/11/2011    Procedure: COLONOSCOPY;  Surgeon: Dorothyann Peng, MD;  [facility];  Service: Endoscopy;  Laterality: N/A;  . COLONOSCOPY  2005 /2012   Dr. Lucianne Muss- diverticulosis, ext hemorrhoids.  . COLONOSCOPY WITH PROPOFOL N/A 11/09/2017   Procedure: COLONOSCOPY WITH PROPOFOL;  Surgeon: Mauri Pole, MD;  [facility];  Service: Endoscopy;  Laterality: N/A;  . COLONOSCOPY WITH PROPOFOL N/A 11/12/2017   Procedure: COLONOSCOPY WITH PROPOFOL;  Surgeon: Ladene Artist, MD;  [facility];  Service: Endoscopy;  Laterality: N/A;  . CYSTOSCOPY  2007  . ESOPHAGOGASTRODUODENOSCOPY  09/11/2011   Procedure: ESOPHAGOGASTRODUODENOSCOPY (EGD);  Surgeon: Dorothyann Peng, MD;  [facility];  Service: Endoscopy;  Laterality: N/A;  . ESOPHAGOGASTRODUODENOSCOPY (EGD) WITH PROPOFOL N/A 11/08/2017   Procedure: ESOPHAGOGASTRODUODENOSCOPY (EGD) WITH PROPOFOL;  Surgeon: Mauri Pole, MD;  [facility];  Service: Endoscopy;  Laterality: N/A;  . LIVER BIOPSY  2008  . LUNG BIOPSY Left 06/06/2010  . REVISION TOTAL HIP ARTHROPLASTY  2007   Left  . SKIN CANCER EXCISION     nose and left lower cheek  . TONSILLECTOMY  1930  . TOTAL HIP ARTHROPLASTY Bilateral 2005 & 2007    Dr. Earl Lagos. Aline Brochure     Allergies  Allergen Reactions  . Augmentin [Amoxicillin-Pot Clavulanate] Other (See Comments)    unknown  . Ibuprofen Hives  . Naproxen Other (See Comments)    unknown  . Statins Other (See Comments)    Feels like knives in stomach  . Surgilube [Gyne-Moistrin]     Rectal itching, burning  . Tape Rash    Allergies as of 07/13/2020      Reactions   Augmentin [amoxicillin-pot Clavulanate] Other (See Comments)   unknown   Ibuprofen Hives   Naproxen Other (See Comments)   unknown   Statins Other (See Comments)   Feels like knives in stomach   Surgilube [gyne-moistrin]    Rectal itching, burning   Tape Rash      Medication List       Accurate as of July 13, 2020 11:59 PM. If you have any questions, ask your nurse or doctor.        acetaminophen 500 MG tablet Commonly known as: TYLENOL Take 500 mg by mouth 2 (two) times daily.   atenolol 25 MG tablet Commonly known as: TENORMIN Take 1 tablet (25 mg total) by mouth daily.   B Complex-B12 Tabs Take 1 tablet by mouth daily.   diphenhydrAMINE 25 mg capsule Commonly known as: BENADRYL Take 25 mg by mouth daily as needed.   DULoxetine 30 MG capsule Commonly known as: CYMBALTA Take 1 capsule (30 mg total) by mouth daily.   feeding  supplement (BOOST BREEZE) Liqd Take 237 mLs by mouth daily. orange flavor   ferrous sulfate 220 (44 Fe) MG/5ML solution Take 220 mg by mouth daily. 5 mL by mouth daily with meal Once A Day   folic acid 601 MCG tablet Commonly known as: FOLVITE Take 1 tablet (400 mcg total) by mouth daily.   gabapentin 100 MG capsule Commonly known as: NEURONTIN Take 1 capsule (100 mg total) by mouth 2 (two) times daily.   Imvexxy Maintenance Pack 4 MCG Inst Generic drug: Estradiol Place 4 mcg vaginally once a week. On Thursday   latanoprost 0.005 % ophthalmic solution Commonly known as: XALATAN Place 1 drop into both eyes at bedtime.   magnesium oxide 400 (241.3 Mg) MG tablet Commonly known as: MAG-OX Take 1 tablet (400 mg total) by mouth 2 (two) times daily.   omeprazole 20 MG capsule Commonly known as: PRILOSEC Take 1 capsule (20 mg total) by mouth daily.   PreserVision AREDS 2 Caps Take 1 capsule by mouth 2 (two) times daily.   simethicone 80 MG chewable tablet Commonly known as: MYLICON Chew 80 mg by mouth 4 (four) times daily as needed for flatulence.   sodium chloride 1 g tablet Take 1 g by mouth daily.   traMADol 50 MG tablet Commonly known as: ULTRAM Take 25 mg by mouth 2 (two) times daily.   traMADol 50 MG tablet Commonly known as: ULTRAM Take 0.5-1 tablets (25-50 mg total) by mouth every 6 (six) hours as needed.       Review of  Systems  Constitutional: Negative for appetite change, fatigue and fever.  HENT: Positive for hearing loss. Negative for congestion and voice change.   Respiratory: Negative for cough and shortness of breath.   Cardiovascular: Positive for leg swelling.  Gastrointestinal: Negative for abdominal pain, constipation and diarrhea.  Genitourinary: Positive for frequency. Negative for difficulty urinating and dysuria.  Musculoskeletal: Positive for arthralgias and gait problem.       Acute right hip pain  Skin: Negative for color change.  Neurological: Positive for weakness. Negative for facial asymmetry, speech difficulty and light-headedness.       Low body weakness, uses electric scooter for mobility.   Psychiatric/Behavioral: Negative for behavioral problems, confusion, hallucinations and sleep disturbance. The patient is not nervous/anxious.     Immunization History  Administered Date(s) Administered  . Influenza Whole 09/22/2008, 09/16/2012, 09/30/2013  . Influenza, High Dose Seasonal PF 09/25/2017, 09/19/2019  . Influenza,inj,Quad PF,6+ Mos 10/09/2018  . Influenza-Unspecified 10/02/2014, 09/15/2015, 09/27/2016  . Moderna SARS-COVID-2 Vaccination 12/19/2019, 01/16/2020  . Pneumococcal Conjugate-13 11/29/2017  . Pneumococcal Polysaccharide-23 08/17/2006  . Td 12/17/2005   Pertinent  Health Maintenance Due  Topic Date Due  . FOOT EXAM  Never done  . URINE MICROALBUMIN  04/30/2008  . OPHTHALMOLOGY EXAM  12/27/2019  . INFLUENZA VACCINE  07/17/2020  . HEMOGLOBIN A1C  11/17/2020  . DEXA SCAN  Completed  . PNA vac Low Risk Adult  Completed   Fall Risk  10/07/2018 09/19/2017 05/17/2016 04/20/2016 04/19/2016  Falls in the past year? Yes No No No No  Number falls in past yr: 1 - - - -  Injury with Fall? No - - - -   Functional Status Survey:    Vitals:   07/13/20 1124  BP: (!) 146/78  Pulse: 68  Resp: 18  Temp: 98.8 F (37.1 C)  SpO2: 92%  Weight: 145 lb (65.8 kg)  Height: _0   (1.626 m)   Body mass index  is 24.89 kg/m. Physical Exam Vitals and nursing note reviewed.  Constitutional:      Appearance: Normal appearance.  HENT:     Head: Normocephalic and atraumatic.     Mouth/Throat:     Mouth: Mucous membranes are moist.  Eyes:     Extraocular Movements: Extraocular movements intact.     Conjunctiva/sclera: Conjunctivae normal.     Pupils: Pupils are equal, round, and reactive to light.  Cardiovascular:     Rate and Rhythm: Normal rate and regular rhythm.     Heart sounds: No murmur heard.   Pulmonary:     Breath sounds: No rales.  Abdominal:     General: Bowel sounds are normal.     Palpations: Abdomen is soft.     Tenderness: There is no abdominal tenderness.  Musculoskeletal:        General: Tenderness present.     Cervical back: Normal range of motion and neck supple.     Right lower leg: Edema present.     Left lower leg: Edema present.     Comments: Trace edema BLE. R hip pain is constant  Skin:    General: Skin is warm and dry.  Neurological:     General: No focal deficit present.     Mental Status: She is alert and oriented to person, place, and time. Mental status is at baseline.     Motor: No weakness.     Coordination: Coordination normal.     Gait: Gait abnormal.  Psychiatric:        Mood and Affect: Mood normal.        Behavior: Behavior normal.        Thought Content: Thought content normal.        Judgment: Judgment normal.     Labs reviewed: Recent Labs    04/19/20 0000 05/17/20 0000 06/21/20 0000  NA 131* 131* 133*  K 5.3 4.9 5.0  CL 97* 95* 98*  CO2 30* 30* 30*  BUN 27* 24* 24*  CREATININE 1.0 0.9 0.9  CALCIUM 8.6* 9.3 8.7  MG  --   --  1.8   No results for input(s): AST, ALT, ALKPHOS, BILITOT, PROT, ALBUMIN in the last 8760 hours. Recent Labs    03/22/20 0000 05/18/20 0000  WBC 6.7 6.2  NEUTROABS 4,208 4,179  HGB 9.9* 11.0*  HCT 30* 33*  PLT 232 258   Lab Results  Component Value Date   TSH 1.964  02/21/2019   Lab Results  Component Value Date   HGBA1C 5.3 05/18/2020   Lab Results  Component Value Date   CHOL 188 04/12/2016   HDL 62 04/12/2016   LDLCALC 102 04/12/2016   TRIG 122 04/12/2016   CHOLHDL 3.0 Ratio 06/07/2008    Significant Diagnostic Results in last 30 days:  DG Hips Bilat W or Wo Pelvis 3-4 Views  Result Date: 07/11/2020 CLINICAL DATA:  86 year old female with right rib pain for 2 days. No known injury. EXAM: DG HIP (WITH OR WITHOUT PELVIS) 3-4V BILAT COMPARISON:  Hip series 01/16/2018. FINDINGS: Chronic bilateral total hip arthroplasty. On the right hardware appears stable since 2019 and intact. Likewise on the left hardware appears stable and intact. Underlying osteopenia. Stable appearance of the pelvis, chronic left pubic rami deformities. Chronic SI joint degeneration. Partially visible chronic lumbar spine degeneration. Negative lower abdominal and pelvic visceral contours. IMPRESSION: Chronic bilateral total hip arthroplasty appears stable since 2019. No acute osseous abnormality identified. Electronically Signed   By: Lemmie Evens  [place] Crane M.D.   On: 07/11/2020 18:56    Assessment/Plan Right hip pain The patient stated Tramadol helped in ED, pain is constant, will schedule Tramadol 682m bid and q6h prn. X-ray hips/pelvis in ED. F/u Ortho. Therapy to eval/tx 07/14/20 Tramadol 523mq6hr per patient's request 07/13/20, the patient received total 4 doses, no pain relief from Tramadol. Staff reported the patient pain is not controlled, the patient desires of joint inj-pending Ortho 07/20/20, will try Oxycodone 82m34m4hr prn for pain for now, pain assessment q shift.            HTN (hypertension) Blood pressure is controlled, continue Atenolol.   GERD (gastroesophageal reflux disease) Stable, continue Omeprazole.   Neuropathy Pain is managed, continue Tylenol, Gabapentin.   Blood loss anemia Hgb 9.9 03/22/20, continue Fe, Folic acid, Vt B12B52pdate CBC/diff.    Depression with anxiety Her mood is stable, continue Duloxetine.   Hyponatremia Baseline Na 130s, continue NaCl, update CMP/eGFR  Postmenopausal symptoms Takes Estradiol, R vs B of HRT has been explained to the patient.     Family/ staff Communication: plan of care reviewed with the patient and charge nurse.   Labs/tests ordered:  CBC/diff, CMP/eGFR  Time spend 40 minutes.

## 2020-07-13 NOTE — Assessment & Plan Note (Addendum)
The patient stated Tramadol helped in ED, pain is constant, will schedule Tramadol 168m bid and q6h prn. X-ray hips/pelvis in ED. F/u Ortho. Therapy to eval/tx 07/14/20 Tramadol 510mq6hr per patient's request 07/13/20, the patient received total 4 doses, no pain relief from Tramadol. Staff reported the patient pain is not controlled, the patient desires of joint inj-pending Ortho 07/20/20, will try Oxycodone 68m52m4hr prn for pain for now, pain assessment q shift.

## 2020-07-15 ENCOUNTER — Other Ambulatory Visit: Payer: Self-pay

## 2020-07-15 ENCOUNTER — Other Ambulatory Visit: Payer: Self-pay | Admitting: Family

## 2020-07-15 MED ORDER — OXYCODONE HCL 5 MG PO TABS: 5.0000 mg | ORAL_TABLET | ORAL | 0 refills | Status: DC | PRN

## 2020-07-15 MED ORDER — TRAMADOL HCL 50 MG PO TABS: 50.0000 mg | ORAL_TABLET | Freq: Four times a day (QID) | ORAL | 0 refills | Status: DC | PRN

## 2020-07-15 NOTE — Telephone Encounter (Signed)
Fax received from [facility] for authorization to dispense Oxycodone 5 mg tablet one every 4 hours as needed #30.

## 2020-07-17 ENCOUNTER — Encounter: Payer: Self-pay | Admitting: Nurse Practitioner

## 2020-07-17 NOTE — Assessment & Plan Note (Signed)
Baseline Na 130s, continue NaCl, update CMP/eGFR

## 2020-07-17 NOTE — Assessment & Plan Note (Signed)
Takes Estradiol, R vs B of HRT has been explained to the patient.

## 2020-07-17 NOTE — Assessment & Plan Note (Signed)
Stable, continue Omeprazole.

## 2020-07-17 NOTE — Assessment & Plan Note (Signed)
Her mood is stable, continue Duloxetine.

## 2020-07-17 NOTE — Assessment & Plan Note (Signed)
Pain is managed, continue Tylenol, Gabapentin.

## 2020-07-17 NOTE — Assessment & Plan Note (Signed)
Blood pressure is controlled, continue Atenolol.

## 2020-07-17 NOTE — Assessment & Plan Note (Addendum)
Hgb 9.9 03/22/20, continue Fe, Folic acid, Vt N61, update CBC/diff.

## 2020-07-18 ENCOUNTER — Encounter: Payer: Self-pay | Admitting: Internal Medicine

## 2020-07-18 NOTE — Progress Notes (Signed)
A user error has taken place.

## 2020-07-19 DIAGNOSIS — I10 Essential (primary) hypertension: Secondary | ICD-10-CM | POA: Diagnosis not present

## 2020-07-19 DIAGNOSIS — E876 Hypokalemia: Secondary | ICD-10-CM | POA: Diagnosis not present

## 2020-07-19 LAB — BASIC METABOLIC PANEL
BUN: 13 (ref 4–21)
CO2: 32 — AB (ref 13–22)
Chloride: 92 — AB (ref 99–108)
Creatinine: 0.9 (ref 0.5–1.1)
Glucose: 86
Potassium: 4.1 (ref 3.4–5.3)
Sodium: 130 — AB (ref 137–147)

## 2020-07-19 LAB — CBC AND DIFFERENTIAL
HCT: 31 — AB (ref 36–46)
Hemoglobin: 10 — AB (ref 12.0–16.0)
Neutrophils Absolute: 4434
Platelets: 326 (ref 150–399)
WBC: 6.8

## 2020-07-19 LAB — CBC: RBC: 3.15 — AB (ref 3.87–5.11)

## 2020-07-19 LAB — COMPREHENSIVE METABOLIC PANEL: Calcium: 8.7 (ref 8.7–10.7)

## 2020-07-20 ENCOUNTER — Ambulatory Visit (INDEPENDENT_AMBULATORY_CARE_PROVIDER_SITE_OTHER): Payer: Medicare HMO | Admitting: Family Medicine

## 2020-07-20 ENCOUNTER — Encounter: Payer: Self-pay | Admitting: Family Medicine

## 2020-07-20 DIAGNOSIS — M25551 Pain in right hip: Secondary | ICD-10-CM

## 2020-07-20 NOTE — Progress Notes (Signed)
Office Visit Note   Patient: [name]           Date of Birth: 21-Dec-1920           MRN: [id number] Visit Date: 07/20/2020 Requested by: Virgie Dad, MD [address] [place],  [place] [address] PCP: Virgie Dad, MD  Subjective: Chief Complaint  Patient presents with  . Right Hip - Pain    Pain "deep in the joint" x a little over 1 week. Started feeling it while lying down in bed at night. NKI. In motorized wheelchair, normally transferring by herself. Now needs help for transfers, due to pain.    HPI: Patient is a pleasant 86 year old female presenting to clinic today with 10 days of severe right hip pain.  She states that her pain started while laying in bed and trying to adjust her position, and is not sure what she did to aggravate it.  She has a history of a joint replacement on this side dating back to 2006, which she states she felt she never fully recovered from.  She currently uses a power wheelchair to get around, but before this pain she had no trouble with transfers independently.  Now however, she is in too much pain to transfer from her wheelchair.  She denies any rashes over the painful area, no fevers, no other systemic symptoms to accompany this pain.  She states that her pain is located on the lateral aspect of her hip, but feels that it is "deep."  Denies any pain in the front of her groin.  She is accompanied by her adult children today, who have been helping her with her transfers, and states that they have "never seen her in this much pain."  Shortly after the onset of her symptoms patient presented to the emergency room where x-rays were obtained, which showed no acute bony abnormalities.  She was given tramadol for pain control, which she states "takes the edge off."  She states she would strongly like to try an injection to see if that makes her pain more manageable.              ROS:   All other systems were reviewed and are negative.  Objective: Vital  Signs: There were no vitals taken for this visit.  Physical Exam:  General:  Alert and oriented, in no acute distress. Pulm:  Breathing unlabored. Psy:  Normal mood, congruent affect. Skin:  No rashes, no skin breakdown. No erythema.   Musculoskeletal: Examination limited as patient poorly tolerated transfer from wheelchair. Right hip with no obvious deformity. No pain with logroll of the hip, no pain with internal or external hip rotation. Tenderness to palpation within right gluteal musculature, significant tenderness to palpation over right greater trochanter. No tenderness to palpation at distal IT band insertion point.   Imaging: None today.  X-ray results from emergency department visit reviewed.  Assessment & Plan: 86 year old female presenting to clinic with 10 days of acute right sided lateral hip pain.  No evidence to support septic hip, as patient has no systemic symptoms, and denies any pain with mobilization of the hip joint itself.  Examination is most significant for tenderness with palpation of the greater trochanteric area. -Given patient's significant discomfort today, patient was offered a cortisone injection to see if this would calm her pain.  Risks and benefits were discussed, and patient opted to proceed. -Procedure performed as described below which patient tolerated very well.  She had  no immediate complications, and voiced immediate improvement in her symptoms. -Strict return precautions were discussed.  If pain worsens or fails to improve within 5 days patient was strongly encouraged to return to clinic for reevaluation. -Patient had no further questions or concerns at the completion of her visit today.     Procedures: Right greater trochanteric bursa injection: Verbal consent obtained, timeout performed. Overlying skin was prepped in a sterile fashion using Betadine and alcohol wipes. 40 mg methylprednisolone and 10 mL of 1% lidocaine without epinephrine  were injected into the area of most significant tenderness along the lateral aspect of the right hip over the greater trochanter, using a 22-gauge spinal needle.     PMFS History: Patient Active Problem List   Diagnosis Date Noted  . Right hip pain 07/13/2020  . T2DM (type 2 diabetes mellitus) ([place]) 03/16/2020  . Postmenopausal symptoms 03/16/2020  . CKD (chronic kidney disease) stage 3, GFR 30-59 ml/min 07/29/2019  . Pain in gums 07/22/2019  . Amaurosis fugax of right eye 01/15/2019  . Glaucoma 01/14/2019  . Macular degeneration 01/14/2019  . Chronic diastolic CHF (congestive heart failure) ([place]) 08/13/2018  . Pulmonary hypertension, primary ([place]) 07/31/2018  . Neuropathy 06/20/2018  . Paroxysmal atrial fibrillation ([place]) 06/20/2018  . Iron deficiency 01/21/2018  . Fall 01/09/2018  . Advanced care planning/counseling discussion 11/20/2017  . Melena   . Diverticulosis of colon with hemorrhage   . Hyponatremia 07/09/2015  . Hearing loss of both ears 04/08/2014  . HTN (hypertension) 03/27/2014  . Skin lesion of face 03/27/2014  . Rotator cuff syndrome of right shoulder 09/30/2012  . Breast cancer ([place])   . Lung cancer, lower lobe ([facility]) 10/25/2011  . PSVT (paroxysmal supraventricular tachycardia) ([place]) 03/29/2011  . INTERDIGITAL NEUROMA 03/28/2010  . IMPINGEMENT SYNDROME 03/28/2010  . THYROID NODULE, RIGHT 09/06/2008  . LEG EDEMA, BILATERAL 12/22/2007  . Depression with anxiety 07/31/2007  . Other specified disorders of adrenal gland ([place]) 05/14/2007  . Hyperlipidemia 01/28/2007  . Blood loss anemia 01/28/2007  . Allergic rhinitis 01/28/2007  . GERD (gastroesophageal reflux disease) 01/28/2007  . OVERACTIVE BLADDER 01/28/2007  . Osteoarthritis 01/28/2007  . Pain in lower back 01/28/2007  . Osteoporosis 01/28/2007   Past Medical History:  Diagnosis Date  . Allergic rhinitis   . Anemia    NOS  . Anxiety   . Biceps tendon rupture    Bilateral  . Breast cancer ([place])  2001   Left s/p lumpectomy and XRT   . Bronchiectasis ([place]) 2014   Noted on chest x-ray  . Complication of anesthesia    slow to wake up  . Cough 2014  . Depression   . Diverticulosis   . Elevated liver enzymes    Biopsy consistent with steatohepatitis  . Fundic gland polyps of stomach, benign   . GERD (gastroesophageal reflux disease) 06/2007   EGD Dr Gala Romney >sm HH, multiple fundic gland polyps  . Glaucoma 2013   both eyes  . Hemorrhoids   . Hiatal hernia   . Hx of radiation therapy 07/17/10 to 07/21/10   SRS LLL lung  . Hyperlipidemia   . Insomnia   . Low back pain    scoliosis  . Lung cancer ([facility]) 05/2010   Left 2011 - rad x 3  . Lymphedema    Left arm  . Osteoarthritis   . Osteopenia 10/25/2016  . Osteoporosis, senile   . Overactive bladder   . Pneumonia    RLL with sepsis  . PSVT (paroxysmal  supraventricular tachycardia) ([place])   . Rheumatic fever    Age 24  . Right thyroid nodule   . Steatohepatitis    liver bx  . Type 2 diabetes mellitus ([facility])     Family History  Problem Relation Age of Onset  . Heart failure Mother   . Osteoporosis Mother   . Hypertension Father   . Stroke Father   . Heart failure Father   . Leukemia Brother   . Heart failure Brother   . Cancer Maternal Grandmother   . Stroke Maternal Grandfather   . Cancer Other     Past Surgical History:  Procedure Laterality Date  . ABDOMINAL HYSTERECTOMY  1964  . APPENDECTOMY  1964  . BIOPSY THYROID  11/2008  . BREAST BIOPSY     X 3 w/ cystectomy  . BREAST LUMPECTOMY  2001  . CATARACT EXTRACTION Bilateral 2003   Lens implant Dr. Charise Killian  . CHOLECYSTECTOMY  1965  . COLONOSCOPY  09/11/2011   Procedure: COLONOSCOPY;  Surgeon: Dorothyann Peng, MD;  [facility];  Service: Endoscopy;  Laterality: N/A;  . COLONOSCOPY  2005 /2012   Dr. Lucianne Muss- diverticulosis, ext hemorrhoids.  . COLONOSCOPY WITH PROPOFOL N/A 11/09/2017   Procedure: COLONOSCOPY WITH PROPOFOL;  Surgeon: Mauri Pole, MD;   [facility];  Service: Endoscopy;  Laterality: N/A;  . COLONOSCOPY WITH PROPOFOL N/A 11/12/2017   Procedure: COLONOSCOPY WITH PROPOFOL;  Surgeon: Ladene Artist, MD;  [facility];  Service: Endoscopy;  Laterality: N/A;  . CYSTOSCOPY  2007  . ESOPHAGOGASTRODUODENOSCOPY  09/11/2011   Procedure: ESOPHAGOGASTRODUODENOSCOPY (EGD);  Surgeon: Dorothyann Peng, MD;  [facility];  Service: Endoscopy;  Laterality: N/A;  . ESOPHAGOGASTRODUODENOSCOPY (EGD) WITH PROPOFOL N/A 11/08/2017   Procedure: ESOPHAGOGASTRODUODENOSCOPY (EGD) WITH PROPOFOL;  Surgeon: Mauri Pole, MD;  [facility];  Service: Endoscopy;  Laterality: N/A;  . LIVER BIOPSY  2008  . LUNG BIOPSY Left 06/06/2010  . REVISION TOTAL HIP ARTHROPLASTY  2007   Left  . SKIN CANCER EXCISION     nose and left lower cheek  . TONSILLECTOMY  1930  . TOTAL HIP ARTHROPLASTY Bilateral 2005 & 2007    Dr. Earl Lagos. Aline Brochure    Social History   Occupational History  . Occupation: retired Tax Comptroller: RETIRED  Tobacco Use  . Smoking status: Never Smoker  . Smokeless tobacco: Never Used  . Tobacco comment: 2nd hand-husband  Vaping Use  . Vaping Use: Never used  Substance and Sexual Activity  . Alcohol use: No  . Drug use: No  . Sexual activity: Not Currently

## 2020-07-20 NOTE — Progress Notes (Signed)
I saw and examined the patient with Dr. Elouise Munroe and agree with assessment and plan as outlined.    Right lateral hip pain for the past week, no definite injury.  She woke up 1 morning with pain.  She is normally able to bear weight with transfers, but now she is having a great deal of difficulty.  She is status post bilateral hip replacements.  The left one did well, but the right one has never done well.  She recently went to the ER where x-rays were obtained, no obvious abnormality with the prostheses.  I reviewed the films today as well.  Examination reveals point tenderness over the lateral aspect of the greater trochanter.  She has a little bit of pain in the sciatic notch.  No pain with logroll of her hip.  Impression his right hip greater trochanter syndrome.  Plan: We will inject with cortisone today.  If not improved next week, could contemplate physical therapy at her residence.

## 2020-07-21 ENCOUNTER — Non-Acute Institutional Stay: Payer: Medicare HMO | Admitting: Nurse Practitioner

## 2020-07-21 ENCOUNTER — Encounter: Payer: Self-pay | Admitting: Nurse Practitioner

## 2020-07-21 DIAGNOSIS — K219 Gastro-esophageal reflux disease without esophagitis: Secondary | ICD-10-CM

## 2020-07-21 DIAGNOSIS — K5901 Slow transit constipation: Secondary | ICD-10-CM | POA: Diagnosis not present

## 2020-07-21 DIAGNOSIS — R69 Illness, unspecified: Secondary | ICD-10-CM | POA: Diagnosis not present

## 2020-07-21 DIAGNOSIS — I10 Essential (primary) hypertension: Secondary | ICD-10-CM

## 2020-07-21 DIAGNOSIS — N959 Unspecified menopausal and perimenopausal disorder: Secondary | ICD-10-CM | POA: Diagnosis not present

## 2020-07-21 DIAGNOSIS — E871 Hypo-osmolality and hyponatremia: Secondary | ICD-10-CM

## 2020-07-21 DIAGNOSIS — D500 Iron deficiency anemia secondary to blood loss (chronic): Secondary | ICD-10-CM

## 2020-07-21 DIAGNOSIS — R5381 Other malaise: Secondary | ICD-10-CM | POA: Diagnosis not present

## 2020-07-21 DIAGNOSIS — D649 Anemia, unspecified: Secondary | ICD-10-CM | POA: Diagnosis not present

## 2020-07-21 DIAGNOSIS — M25551 Pain in right hip: Secondary | ICD-10-CM

## 2020-07-21 DIAGNOSIS — F418 Other specified anxiety disorders: Secondary | ICD-10-CM

## 2020-07-21 DIAGNOSIS — G629 Polyneuropathy, unspecified: Secondary | ICD-10-CM | POA: Diagnosis not present

## 2020-07-21 LAB — BASIC METABOLIC PANEL
BUN: 13 (ref 4–21)
Chloride: 87 — AB (ref 99–108)
Creatinine: 0.8 (ref 0.5–1.1)
Glucose: 92
Potassium: 4.6 (ref 3.4–5.3)
Sodium: 127 — AB (ref 137–147)

## 2020-07-21 LAB — CBC AND DIFFERENTIAL
HCT: 34 — AB (ref 36–46)
Hemoglobin: 11.3 — AB (ref 12.0–16.0)
Neutrophils Absolute: 6392
Platelets: 344 (ref 150–399)
WBC: 8

## 2020-07-21 LAB — CBC: RBC: 3.57 — AB (ref 3.87–5.11)

## 2020-07-21 NOTE — Progress Notes (Signed)
Location:   [place] Room Number: [place] of Service:  [id number] [phone]) Provider: Lennie Odor Daine Croker NP  Virgie Dad, MD  Patient Care Team: Virgie Dad, MD as PCP - General (Internal Medicine) Lanelle Bal., MD as Referring Physician (Internal Medicine) Satira Sark, MD as Consulting Physician (Cardiology) Kyung Rudd, MD as Consulting Physician (Radiation Oncology) Virgil Lightner X, NP as Nurse Practitioner (Internal Medicine)  Extended Emergency Contact Information Primary Emergency Contact: Neita Garnet of [place] Phone: [phone] Relation: Son Secondary Emergency Contact: Alves,Lance Address: [address]          [place], [place] [address] [name] of [place] Phone: [phone] Mobile Phone: [phone] Relation: Son  Code Status: DNR Goals of care: Advanced Directive information Advanced Directives 07/11/2020  Does Patient Have a Medical Advance Directive? No  Type of Advance Directive -  Does patient want to make changes to medical advance directive? -  Copy of [place] in Chart? -  Would patient like information on creating a medical advance directive? No - Patient declined  Pre-existing out of facility DNR order (yellow form or pink MOST form) -     Chief Complaint  Patient presents with  . Acute Visit    constipation    HPI:  Pt is a 86 y.o. female seen today for an acute visit for constipation, persisted right hip pain, debility    S/p Ortho right hip inj x2, pain is persistent, the patient desires discontinue Oxycodone-made her dizzy/drowsy, wants Tramadol prn, wants to schedule Tylenol 559m tid.  Rectal check, hard stools removed digitally after suppository x2.  The patient needs assistance with ADLs, [facility]     Depression/anxiety takes, Duloxetine 321mqd.              Hyponatremia, Na in 130s, chronic, on Salt Tab daily.               GERD, stable, on Omeprazole 2068md.              Peripheral neuropathy, on Gabapentin 100m67md, Tylenol 500mg74m.         Anemia, stable, on Fe, Folic acid, Vit B12. D22          Post menopausal symptoms, stable, on Estradiol 4mcg 26my.              HTN, blood pressure is controlled, on Atenolol 25mg q64m     Past Medical History:  Diagnosis Date  . Allergic rhinitis   . Anemia    NOS  . Anxiety   . Biceps tendon rupture    Bilateral  . Breast cancer (HCC) [place]  Left s/p lumpectomy and XRT   . Bronchiectasis (HCC) [place]  Noted on chest x-ray  . Complication of anesthesia    slow to wake up  . Cough 2014  . Depression   . Diverticulosis   . Elevated liver enzymes    Biopsy consistent with steatohepatitis  . Fundic gland polyps of stomach, benign   . GERD (gastroesophageal reflux disease) 06/2007   EGD Dr Rourk >Gala Romney, multiple fundic gland polyps  . Glaucoma 2013   both eyes  . Hemorrhoids   . Hiatal hernia   . Hx of radiation therapy 07/17/10 to 07/21/10   SRS LLL lung  . Hyperlipidemia   . Insomnia   . Low back pain  scoliosis  . Lung cancer ([place]) 05/2010   Left 2011 - rad x 3  . Lymphedema    Left arm  . Osteoarthritis   . Osteopenia 10/25/2016  . Osteoporosis, senile   . Overactive bladder   . Pneumonia    RLL with sepsis  . PSVT (paroxysmal supraventricular tachycardia) ([place])   . Rheumatic fever    Age 86  . Right thyroid nodule   . Steatohepatitis    liver bx  . Type 2 diabetes mellitus ([place])    Past Surgical History:  Procedure Laterality Date  . ABDOMINAL HYSTERECTOMY  1964  . APPENDECTOMY  1964  . BIOPSY THYROID  11/2008  . BREAST BIOPSY     X 3 w/ cystectomy  . BREAST LUMPECTOMY  2001  . CATARACT EXTRACTION Bilateral 2003   Lens implant Dr. Charise Killian  . CHOLECYSTECTOMY  1965  . COLONOSCOPY  09/11/2011   Procedure: COLONOSCOPY;  Surgeon: Dorothyann Peng, MD;  [facility];  Service: Endoscopy;  Laterality: N/A;  . COLONOSCOPY   2005 /2012   Dr. Lucianne Muss- diverticulosis, ext hemorrhoids.  . COLONOSCOPY WITH PROPOFOL N/A 11/09/2017   Procedure: COLONOSCOPY WITH PROPOFOL;  Surgeon: Mauri Pole, MD;  [facility];  Service: Endoscopy;  Laterality: N/A;  . COLONOSCOPY WITH PROPOFOL N/A 11/12/2017   Procedure: COLONOSCOPY WITH PROPOFOL;  Surgeon: Ladene Artist, MD;  [facility];  Service: Endoscopy;  Laterality: N/A;  . CYSTOSCOPY  2007  . ESOPHAGOGASTRODUODENOSCOPY  09/11/2011   Procedure: ESOPHAGOGASTRODUODENOSCOPY (EGD);  Surgeon: Dorothyann Peng, MD;  [facility];  Service: Endoscopy;  Laterality: N/A;  . ESOPHAGOGASTRODUODENOSCOPY (EGD) WITH PROPOFOL N/A 11/08/2017   Procedure: ESOPHAGOGASTRODUODENOSCOPY (EGD) WITH PROPOFOL;  Surgeon: Mauri Pole, MD;  [facility];  Service: Endoscopy;  Laterality: N/A;  . LIVER BIOPSY  2008  . LUNG BIOPSY Left 06/06/2010  . REVISION TOTAL HIP ARTHROPLASTY  2007   Left  . SKIN CANCER EXCISION     nose and left lower cheek  . TONSILLECTOMY  1930  . TOTAL HIP ARTHROPLASTY Bilateral 2005 & 2007    Dr. Earl Lagos. Aline Brochure     Allergies  Allergen Reactions  . Augmentin [Amoxicillin-Pot Clavulanate] Other (See Comments)    unknown  . Ibuprofen Hives  . Naproxen Other (See Comments)    unknown  . Statins Other (See Comments)    Feels like knives in stomach  . Surgilube [Gyne-Moistrin]     Rectal itching, burning  . Tape Rash    Allergies as of 07/21/2020      Reactions   Augmentin [amoxicillin-pot Clavulanate] Other (See Comments)   unknown   Ibuprofen Hives   Naproxen Other (See Comments)   unknown   Statins Other (See Comments)   Feels like knives in stomach   Surgilube [gyne-moistrin]    Rectal itching, burning   Tape Rash      Medication List       Accurate as of July 21, 2020 11:59 PM. If you have any questions, ask your nurse or doctor.        STOP taking these medications   oxyCODONE 5 MG  immediate release tablet Commonly known as: Oxy IR/ROXICODONE Stopped by: Chandler Stofer X Meliton Samad, NP     TAKE these medications   acetaminophen 500 MG tablet Commonly known as: TYLENOL Take 500 mg by mouth in the morning, at noon, and at bedtime.   acetaminophen 500 MG tablet Commonly known as: TYLENOL Take 500 mg  by mouth once.   atenolol 25 MG tablet Commonly known as: TENORMIN Take 1 tablet (25 mg total) by mouth daily.   B Complex-B12 Tabs Take 1 tablet by mouth daily.   diphenhydrAMINE 25 mg capsule Commonly known as: BENADRYL Take 25 mg by mouth daily as needed.   DULoxetine 30 MG capsule Commonly known as: CYMBALTA Take 1 capsule (30 mg total) by mouth daily.   feeding supplement (BOOST BREEZE) Liqd Take 237 mLs by mouth daily. orange flavor   ferrous sulfate 220 (44 Fe) MG/5ML solution Take 220 mg by mouth daily. 5 mL by mouth daily with meal Once A Day   folic acid 750 MCG tablet Commonly known as: FOLVITE Take 1 tablet (400 mcg total) by mouth daily.   gabapentin 100 MG capsule Commonly known as: NEURONTIN Take 1 capsule (100 mg total) by mouth 2 (two) times daily.   Imvexxy Maintenance Pack 4 MCG Inst Generic drug: Estradiol Place 4 mcg vaginally once a week. On Thursday   latanoprost 0.005 % ophthalmic solution Commonly known as: XALATAN Place 1 drop into both eyes at bedtime.   magnesium oxide 400 (241.3 Mg) MG tablet Commonly known as: MAG-OX Take 1 tablet (400 mg total) by mouth 2 (two) times daily.   omeprazole 20 MG capsule Commonly known as: PRILOSEC Take 1 capsule (20 mg total) by mouth daily.   polyethylene glycol 17 g packet Commonly known as: MIRALAX / GLYCOLAX Take 17 g by mouth daily.   PreserVision AREDS 2 Caps Take 1 capsule by mouth 2 (two) times daily.   Senokot S 8.6-50 MG tablet Generic drug: senna-docusate Take 1 tablet by mouth daily.   simethicone 80 MG chewable tablet Commonly known as: MYLICON Chew 80 mg by mouth 4 (four)  times daily as needed for flatulence.   sodium chloride 1 g tablet Take 1 g by mouth daily.   traMADol 50 MG tablet Commonly known as: ULTRAM Take 1 tablet (50 mg total) by mouth every 6 (six) hours as needed.       Review of Systems  Constitutional: Positive for appetite change and fatigue. Negative for fever.  HENT: Positive for hearing loss. Negative for congestion and voice change.   Respiratory: Negative for cough and shortness of breath.   Cardiovascular: Positive for leg swelling.  Gastrointestinal: Positive for constipation. Negative for abdominal pain and diarrhea.  Genitourinary: Positive for frequency. Negative for difficulty urinating and dysuria.  Musculoskeletal: Positive for arthralgias and gait problem.       Acute right hip pain  Skin: Negative for color change.  Neurological: Positive for weakness. Negative for facial asymmetry, speech difficulty and light-headedness.       Low body weakness, uses electric scooter for mobility.   Psychiatric/Behavioral: Positive for dysphoric mood. Negative for behavioral problems, confusion, hallucinations and sleep disturbance. The patient is nervous/anxious.        Repetitive.     Immunization History  Administered Date(s) Administered  . Influenza Whole 09/22/2008, 09/16/2012, 09/30/2013  . Influenza, High Dose Seasonal PF 09/25/2017, 09/19/2019  . Influenza,inj,Quad PF,6+ Mos 10/09/2018  . Influenza-Unspecified 10/02/2014, 09/15/2015, 09/27/2016  . Moderna SARS-COVID-2 Vaccination 12/19/2019, 01/16/2020  . Pneumococcal Conjugate-13 11/29/2017  . Pneumococcal Polysaccharide-23 08/17/2006  . Td 12/17/2005   Pertinent  Health Maintenance Due  Topic Date Due  . FOOT EXAM  Never done  . URINE MICROALBUMIN  04/30/2008  . OPHTHALMOLOGY EXAM  12/27/2019  . INFLUENZA VACCINE  07/17/2020  . HEMOGLOBIN A1C  11/17/2020  .  DEXA SCAN  Completed  . PNA vac Low Risk Adult  Completed   Fall Risk  10/07/2018 09/19/2017 05/17/2016  04/20/2016 04/19/2016  Falls in the past year? Yes No No No No  Number falls in past yr: 1 - - - -  Injury with Fall? No - - - -   Functional Status Survey:    Vitals:   07/21/20 1542  BP: (!) 143/91  Pulse: 66  Resp: 20  Temp: (!) 97.3 F (36.3 C)  SpO2: 94%  Weight: 145 lb (65.8 kg)  Height: 5' 4"  (1.626 m)   Body mass index is 24.89 kg/m. Physical Exam Vitals and nursing note reviewed.  Constitutional:      Appearance: Normal appearance.  HENT:     Head: Normocephalic and atraumatic.     Mouth/Throat:     Mouth: Mucous membranes are moist.  Eyes:     Extraocular Movements: Extraocular movements intact.     Conjunctiva/sclera: Conjunctivae normal.     Pupils: Pupils are equal, round, and reactive to light.  Cardiovascular:     Rate and Rhythm: Normal rate and regular rhythm.     Heart sounds: No murmur heard.   Pulmonary:     Breath sounds: No rales.  Abdominal:     General: Bowel sounds are normal. There is no distension.     Palpations: Abdomen is soft.     Tenderness: There is no abdominal tenderness. There is no right CVA tenderness, left CVA tenderness, guarding or rebound.     Comments: Hard stool removed digitally during my examination.   Musculoskeletal:        General: Tenderness present.     Cervical back: Normal range of motion and neck supple.     Right lower leg: Edema present.     Left lower leg: Edema present.     Comments: Trace edema BLE. R hip pain is constant  Skin:    General: Skin is warm and dry.  Neurological:     General: No focal deficit present.     Mental Status: She is alert and oriented to person, place, and time. Mental status is at baseline.     Motor: No weakness.     Coordination: Coordination normal.     Gait: Gait abnormal.  Psychiatric:        Behavior: Behavior normal.        Thought Content: Thought content normal.     Comments: Anxious, repetitive     Labs reviewed: Recent Labs    05/17/20 0000 05/17/20 0000  06/21/20 0000 07/19/20 0000 07/21/20 0000  NA 131*   < > 133* 130* 127*  K 4.9   < > 5.0 4.1 4.6  CL 95*   < > 98* 92* 87*  CO2 30*  --  30* 32*  --   BUN 24*   < > 24* 13 13  CREATININE 0.9   < > 0.9 0.9 0.8  CALCIUM 9.3  --  8.7 8.7  --   MG  --   --  1.8  --   --    < > = values in this interval not displayed.   No results for input(s): AST, ALT, ALKPHOS, BILITOT, PROT, ALBUMIN in the last 8760 hours. Recent Labs    05/18/20 0000 07/19/20 0000 07/21/20 0000  WBC 6.2 6.8 8.0  NEUTROABS 4,179 4,434 6,392  HGB 11.0* 10.0* 11.3*  HCT 33* 31* 34*  PLT 258 326 344   Lab Results  Component Value Date   TSH 1.964 02/21/2019   Lab Results  Component Value Date   HGBA1C 5.3 05/18/2020   Lab Results  Component Value Date   CHOL 188 04/12/2016   HDL 62 04/12/2016   LDLCALC 102 04/12/2016   TRIG 122 04/12/2016   CHOLHDL 3.0 Ratio 06/07/2008    Significant Diagnostic Results in last 30 days:  DG Hips Bilat W or Wo Pelvis 3-4 Views  Result Date: 07/11/2020 CLINICAL DATA:  86 year old female with right rib pain for 2 days. No known injury. EXAM: DG HIP (WITH OR WITHOUT PELVIS) 3-4V BILAT COMPARISON:  Hip series 01/16/2018. FINDINGS: Chronic bilateral total hip arthroplasty. On the right hardware appears stable since 2019 and intact. Likewise on the left hardware appears stable and intact. Underlying osteopenia. Stable appearance of the pelvis, chronic left pubic rami deformities. Chronic SI joint degeneration. Partially visible chronic lumbar spine degeneration. Negative lower abdominal and pelvic visceral contours. IMPRESSION: Chronic bilateral total hip arthroplasty appears stable since 2019. No acute osseous abnormality identified. Electronically Signed   By: Genevie Ann M.D.   On: 07/11/2020 18:56    Assessment/Plan: Slow transit constipation Rectal check, hard stools removed digitally after suppository x2. Will try MiraLax qd, Senokot S I qd.   Hyponatremia 07/19/20 Na 130 at  her baseline.   Right hip pain S/p Ortho right hip inj x2, pain is persistent, the patient desires discontinue Oxycodone-made her dizzy/drowsy, wants Tramadol prn, wants to schedule Tylenol 571m tid.  [facility]   Depression with anxiety Her mood is not well controlled, continue Duloxetine.   GERD (gastroesophageal reflux disease) Stable, continue Omeprazole.   Neuropathy Lower body weakness, continue Gabapentin 1078mbid, Tylenol 50047mid.          Blood loss anemia Hgb 11.3 07/21/20, continue Fe, Folic acid, Vit [id number] (hypertension) Blood pressure is controlled, continue Atenolol.   Postmenopausal symptoms Stable, continue Estradiol.     Family/ staff Communication: plan of care reviewed with the patient and charge nurse.   Labs/tests ordered: BMP 07/21/20  Time spend 40 minutes.

## 2020-07-21 NOTE — Assessment & Plan Note (Signed)
Transfer to SNF [facility] for care needs, therapy.

## 2020-07-21 NOTE — Assessment & Plan Note (Addendum)
Rectal check, hard stools removed digitally after suppository x2. Will try MiraLax qd, Senokot S I qd.

## 2020-07-21 NOTE — Assessment & Plan Note (Signed)
07/19/20 Na 130 at her baseline.

## 2020-07-21 NOTE — Assessment & Plan Note (Signed)
S/p Ortho right hip inj x2, pain is persistent, the patient desires discontinue Oxycodone-made her dizzy/drowsy, wants Tramadol prn, wants to schedule Tylenol 528m tid.

## 2020-07-22 DIAGNOSIS — I270 Primary pulmonary hypertension: Secondary | ICD-10-CM | POA: Diagnosis not present

## 2020-07-22 DIAGNOSIS — E871 Hypo-osmolality and hyponatremia: Secondary | ICD-10-CM | POA: Diagnosis not present

## 2020-07-22 DIAGNOSIS — I10 Essential (primary) hypertension: Secondary | ICD-10-CM | POA: Diagnosis not present

## 2020-07-22 DIAGNOSIS — D649 Anemia, unspecified: Secondary | ICD-10-CM | POA: Diagnosis not present

## 2020-07-22 DIAGNOSIS — E876 Hypokalemia: Secondary | ICD-10-CM | POA: Diagnosis not present

## 2020-07-23 ENCOUNTER — Encounter: Payer: Self-pay | Admitting: Nurse Practitioner

## 2020-07-23 NOTE — Assessment & Plan Note (Signed)
Blood pressure is controlled, continue Atenolol.

## 2020-07-23 NOTE — Assessment & Plan Note (Signed)
Her mood is not well controlled, continue Duloxetine.

## 2020-07-23 NOTE — Assessment & Plan Note (Signed)
Stable, continue Estradiol.

## 2020-07-23 NOTE — Assessment & Plan Note (Signed)
Lower body weakness, continue Gabapentin 167m bid, Tylenol 5032mbid.

## 2020-07-23 NOTE — Assessment & Plan Note (Signed)
Hgb 11.3 07/21/20, continue Fe, Folic acid, Vit O67

## 2020-07-23 NOTE — Assessment & Plan Note (Signed)
Stable, continue Omeprazole.

## 2020-07-25 ENCOUNTER — Other Ambulatory Visit: Payer: Self-pay | Admitting: Family Medicine

## 2020-07-25 ENCOUNTER — Ambulatory Visit: Payer: Self-pay

## 2020-07-25 ENCOUNTER — Ambulatory Visit (INDEPENDENT_AMBULATORY_CARE_PROVIDER_SITE_OTHER): Payer: Medicare HMO | Admitting: Family Medicine

## 2020-07-25 ENCOUNTER — Encounter: Payer: Self-pay | Admitting: Family Medicine

## 2020-07-25 ENCOUNTER — Other Ambulatory Visit: Payer: Self-pay

## 2020-07-25 DIAGNOSIS — M25551 Pain in right hip: Secondary | ICD-10-CM

## 2020-07-25 NOTE — Progress Notes (Signed)
Ketorolac 30 mg given IM left ventrogluteal area.   Lot: [id number] Exp: 10/21 Co: [employer]

## 2020-07-25 NOTE — Progress Notes (Signed)
Office Visit Note   Patient: [name]           Date of Birth: [birth year]           MRN: [id number] Visit Date: 07/25/2020 Requested by: Virgie Dad, MD [id number] [place],  [place] [id number] PCP: Virgie Dad, MD  Subjective: Chief Complaint  Patient presents with  . Right Hip - Pain    Pain is worse in the hip - hurts deep in the hip and around to the right lower back. The cortisone injection in the troch area did not help. Took a Tramadol this a.m. - helps ease the pain a little.    HPI: She is here with persistent right hip pain.  Greater trochanteric injection did not help.  Severe pain when transitioning.  Pain is in the anterior and posterior aspect, she cannot pinpoint where it comes from.  No fevers or chills.  Her son and caretaker are with her today.              ROS:   All other systems were reviewed and are negative.  Objective: Vital Signs: There were no vitals taken for this visit.  Physical Exam:  General:  Alert and oriented, in no acute distress. Pulm:  Breathing unlabored. Psy:  Normal mood, congruent affect. Skin: No visible bruising or erythema Right hip: No tenderness to palpation around the anterior hip.  Very minimal tenderness over the greater trochanter.  No pain reproducible by palpation in the posterior hip.  She is able to flex the hip against resistance with some pain but seemingly intact tendon function.  No pain with abduction, adduction against resistance.  No pain with passive hip flexion and internal/external rotation.  Imaging: US Guided Needle Placement  Result Date: 07/25/2020 Limited diagnostic ultrasound of the anterior hip reveals no obvious joint effusion.  I believe the iliopsoas tendon is intact.   Assessment & Plan: 1.  Persistent right hip pain, etiology uncertain.  Cannot rule out loosening of the prosthesis, joint infection, stress fracture, lumbar disc protrusion, etc. -Elected to try intramuscular Toradol injection  today followed by physical therapy at her residence. -If not improving in the next couple days, we will order MRI of the hip and if this is negative, then lumbar x-rays and MRI scan.     Procedures: No procedures performed  No notes on file     PMFS History: Patient Active Problem List   Diagnosis Date Noted  . Right hip pain 07/13/2020  . T2DM (type 2 diabetes mellitus) ([place]) 03/16/2020  . Postmenopausal symptoms 03/16/2020  . CKD (chronic kidney disease) stage 3, GFR 30-59 ml/min 07/29/2019  . Pain in gums 07/22/2019  . Amaurosis fugax of right eye 01/15/2019  . Glaucoma 01/14/2019  . Macular degeneration 01/14/2019  . Chronic diastolic CHF (congestive heart failure) ([place]) 08/13/2018  . Pulmonary hypertension, primary ([place]) 07/31/2018  . Neuropathy 06/20/2018  . Paroxysmal atrial fibrillation ([employer]) 06/20/2018  . Iron deficiency 01/21/2018  . Fall 01/09/2018  . Advanced care planning/counseling discussion 11/20/2017  . Melena   . Diverticulosis of colon with hemorrhage   . Debility 04/03/2017  . Hyponatremia 07/09/2015  . Hearing loss of both ears 04/08/2014  . HTN (hypertension) 03/27/2014  . Slow transit constipation 03/27/2014  . Skin lesion of face 03/27/2014  . Rotator cuff syndrome of right shoulder 09/30/2012  . Breast cancer ([place])   . Lung cancer, lower lobe ([place]) 10/25/2011  . PSVT (paroxysmal supraventricular  tachycardia) ([place]) 03/29/2011  . INTERDIGITAL NEUROMA 03/28/2010  . IMPINGEMENT SYNDROME 03/28/2010  . THYROID NODULE, RIGHT 09/06/2008  . LEG EDEMA, BILATERAL 12/22/2007  . Depression with anxiety 07/31/2007  . Other specified disorders of adrenal gland ([place]) 05/14/2007  . Hyperlipidemia 01/28/2007  . Blood loss anemia 01/28/2007  . Allergic rhinitis 01/28/2007  . GERD (gastroesophageal reflux disease) 01/28/2007  . OVERACTIVE BLADDER 01/28/2007  . Osteoarthritis 01/28/2007  . Pain in lower back 01/28/2007  . Osteoporosis 01/28/2007   Past  Medical History:  Diagnosis Date  . Allergic rhinitis   . Anemia    NOS  . Anxiety   . Biceps tendon rupture    Bilateral  . Breast cancer ([place]) 2001   Left s/p lumpectomy and XRT   . Bronchiectasis ([place]) 2014   Noted on chest x-ray  . Complication of anesthesia    slow to wake up  . Cough 2014  . Depression   . Diverticulosis   . Elevated liver enzymes    Biopsy consistent with steatohepatitis  . Fundic gland polyps of stomach, benign   . GERD (gastroesophageal reflux disease) 06/2007   EGD Dr Gala Romney >sm [facility], multiple fundic gland polyps  . Glaucoma 2013   both eyes  . Hemorrhoids   . Hiatal hernia   . Hx of radiation therapy 07/17/10 to 07/21/10   SRS LLL lung  . Hyperlipidemia   . Insomnia   . Low back pain    scoliosis  . Lung cancer ([place]) 05/2010   Left 2011 - rad x 3  . Lymphedema    Left arm  . Osteoarthritis   . Osteopenia 10/25/2016  . Osteoporosis, senile   . Overactive bladder   . Pneumonia    RLL with sepsis  . PSVT (paroxysmal supraventricular tachycardia) ([place])   . Rheumatic fever    Age 68  . Right thyroid nodule   . Steatohepatitis    liver bx  . Type 2 diabetes mellitus (HCC)     Family History  Problem Relation Age of Onset  . Heart failure Mother   . Osteoporosis Mother   . Hypertension Father   . Stroke Father   . Heart failure Father   . Leukemia Brother   . Heart failure Brother   . Cancer Maternal Grandmother   . Stroke Maternal Grandfather   . Cancer Other     Past Surgical History:  Procedure Laterality Date  . ABDOMINAL HYSTERECTOMY  1964  . APPENDECTOMY  1964  . BIOPSY THYROID  11/2008  . BREAST BIOPSY     X 3 w/ cystectomy  . BREAST LUMPECTOMY  2001  . CATARACT EXTRACTION Bilateral 2003   Lens implant Dr. Charise Killian  . CHOLECYSTECTOMY  1965  . COLONOSCOPY  09/11/2011   Procedure: COLONOSCOPY;  Surgeon: Dorothyann Peng, MD;  [facility];  Service: Endoscopy;  Laterality: N/A;  . COLONOSCOPY  2005 /2012   Dr. Lucianne Muss-  diverticulosis, ext hemorrhoids.  . COLONOSCOPY WITH PROPOFOL N/A 11/09/2017   Procedure: COLONOSCOPY WITH PROPOFOL;  Surgeon: Mauri Pole, MD;  [facility];  Service: Endoscopy;  Laterality: N/A;  . COLONOSCOPY WITH PROPOFOL N/A 11/12/2017   Procedure: COLONOSCOPY WITH PROPOFOL;  Surgeon: Ladene Artist, MD;  [facility];  Service: Endoscopy;  Laterality: N/A;  . CYSTOSCOPY  2007  . ESOPHAGOGASTRODUODENOSCOPY  09/11/2011   Procedure: ESOPHAGOGASTRODUODENOSCOPY (EGD);  Surgeon: Dorothyann Peng, MD;  [facility];  Service: Endoscopy;  Laterality: N/A;  .  ESOPHAGOGASTRODUODENOSCOPY (EGD) WITH PROPOFOL N/A 11/08/2017   Procedure: ESOPHAGOGASTRODUODENOSCOPY (EGD) WITH PROPOFOL;  Surgeon: Mauri Pole, MD;  [facility];  Service: Endoscopy;  Laterality: N/A;  . LIVER BIOPSY  2008  . LUNG BIOPSY Left 06/06/2010  . REVISION TOTAL HIP ARTHROPLASTY  2007   Left  . SKIN CANCER EXCISION     nose and left lower cheek  . TONSILLECTOMY  1930  . TOTAL HIP ARTHROPLASTY Bilateral 2005 & 2007    Dr. Earl Lagos. Aline Brochure    Social History   Occupational History  . Occupation: retired Tax Comptroller: RETIRED  Tobacco Use  . Smoking status: Never Smoker  . Smokeless tobacco: Never Used  . Tobacco comment: 2nd hand-husband  Vaping Use  . Vaping Use: Never used  Substance and Sexual Activity  . Alcohol use: No  . Drug use: No  . Sexual activity: Not Currently

## 2020-07-26 ENCOUNTER — Encounter (HOSPITAL_COMMUNITY): Payer: Self-pay | Admitting: Emergency Medicine

## 2020-07-26 ENCOUNTER — Inpatient Hospital Stay (HOSPITAL_COMMUNITY)
Admission: EM | Admit: 2020-07-26 | Discharge: 2020-07-29 | DRG: 641 | Disposition: A | Discharge status: Home / Self Care | Payer: Medicare HMO | Attending: Family Medicine | Admitting: Family Medicine

## 2020-07-26 ENCOUNTER — Other Ambulatory Visit: Payer: Self-pay

## 2020-07-26 DIAGNOSIS — M810 Age-related osteoporosis without current pathological fracture: Secondary | ICD-10-CM | POA: Diagnosis present

## 2020-07-26 DIAGNOSIS — K7581 Nonalcoholic steatohepatitis (NASH): Secondary | ICD-10-CM | POA: Diagnosis not present

## 2020-07-26 DIAGNOSIS — Z96643 Presence of artificial hip joint, bilateral: Secondary | ICD-10-CM | POA: Diagnosis present

## 2020-07-26 DIAGNOSIS — S99921A Unspecified injury of right foot, initial encounter: Secondary | ICD-10-CM | POA: Diagnosis not present

## 2020-07-26 DIAGNOSIS — Z9841 Cataract extraction status, right eye: Secondary | ICD-10-CM

## 2020-07-26 DIAGNOSIS — E785 Hyperlipidemia, unspecified: Secondary | ICD-10-CM | POA: Diagnosis present

## 2020-07-26 DIAGNOSIS — N183 Chronic kidney disease, stage 3 unspecified: Secondary | ICD-10-CM | POA: Diagnosis not present

## 2020-07-26 DIAGNOSIS — Z91048 Other nonmedicinal substance allergy status: Secondary | ICD-10-CM

## 2020-07-26 DIAGNOSIS — Z886 Allergy status to analgesic agent status: Secondary | ICD-10-CM

## 2020-07-26 DIAGNOSIS — R0602 Shortness of breath: Secondary | ICD-10-CM | POA: Diagnosis not present

## 2020-07-26 DIAGNOSIS — E1122 Type 2 diabetes mellitus with diabetic chronic kidney disease: Secondary | ICD-10-CM | POA: Diagnosis not present

## 2020-07-26 DIAGNOSIS — J9811 Atelectasis: Secondary | ICD-10-CM | POA: Diagnosis not present

## 2020-07-26 DIAGNOSIS — K5900 Constipation, unspecified: Secondary | ICD-10-CM | POA: Diagnosis present

## 2020-07-26 DIAGNOSIS — M7989 Other specified soft tissue disorders: Secondary | ICD-10-CM | POA: Diagnosis not present

## 2020-07-26 DIAGNOSIS — Z79899 Other long term (current) drug therapy: Secondary | ICD-10-CM

## 2020-07-26 DIAGNOSIS — Z66 Do not resuscitate: Secondary | ICD-10-CM | POA: Diagnosis present

## 2020-07-26 DIAGNOSIS — Z823 Family history of stroke: Secondary | ICD-10-CM

## 2020-07-26 DIAGNOSIS — E876 Hypokalemia: Secondary | ICD-10-CM | POA: Diagnosis not present

## 2020-07-26 DIAGNOSIS — Z880 Allergy status to penicillin: Secondary | ICD-10-CM | POA: Diagnosis not present

## 2020-07-26 DIAGNOSIS — Z888 Allergy status to other drugs, medicaments and biological substances status: Secondary | ICD-10-CM | POA: Diagnosis not present

## 2020-07-26 DIAGNOSIS — E871 Hypo-osmolality and hyponatremia: Principal | ICD-10-CM | POA: Diagnosis present

## 2020-07-26 DIAGNOSIS — R52 Pain, unspecified: Secondary | ICD-10-CM

## 2020-07-26 DIAGNOSIS — J90 Pleural effusion, not elsewhere classified: Secondary | ICD-10-CM | POA: Diagnosis present

## 2020-07-26 DIAGNOSIS — Z7401 Bed confinement status: Secondary | ICD-10-CM | POA: Diagnosis not present

## 2020-07-26 DIAGNOSIS — L03031 Cellulitis of right toe: Secondary | ICD-10-CM | POA: Diagnosis present

## 2020-07-26 DIAGNOSIS — R079 Chest pain, unspecified: Secondary | ICD-10-CM | POA: Diagnosis not present

## 2020-07-26 DIAGNOSIS — Z8249 Family history of ischemic heart disease and other diseases of the circulatory system: Secondary | ICD-10-CM

## 2020-07-26 DIAGNOSIS — M25551 Pain in right hip: Secondary | ICD-10-CM | POA: Diagnosis not present

## 2020-07-26 DIAGNOSIS — K219 Gastro-esophageal reflux disease without esophagitis: Secondary | ICD-10-CM | POA: Diagnosis present

## 2020-07-26 DIAGNOSIS — M2550 Pain in unspecified joint: Secondary | ICD-10-CM | POA: Diagnosis not present

## 2020-07-26 DIAGNOSIS — Z853 Personal history of malignant neoplasm of breast: Secondary | ICD-10-CM

## 2020-07-26 DIAGNOSIS — D649 Anemia, unspecified: Secondary | ICD-10-CM | POA: Diagnosis not present

## 2020-07-26 DIAGNOSIS — R5381 Other malaise: Secondary | ICD-10-CM | POA: Diagnosis not present

## 2020-07-26 DIAGNOSIS — Z9842 Cataract extraction status, left eye: Secondary | ICD-10-CM

## 2020-07-26 DIAGNOSIS — Z961 Presence of intraocular lens: Secondary | ICD-10-CM | POA: Diagnosis present

## 2020-07-26 DIAGNOSIS — F329 Major depressive disorder, single episode, unspecified: Secondary | ICD-10-CM | POA: Diagnosis present

## 2020-07-26 DIAGNOSIS — I10 Essential (primary) hypertension: Secondary | ICD-10-CM | POA: Diagnosis not present

## 2020-07-26 DIAGNOSIS — Z8262 Family history of osteoporosis: Secondary | ICD-10-CM

## 2020-07-26 DIAGNOSIS — Z85118 Personal history of other malignant neoplasm of bronchus and lung: Secondary | ICD-10-CM

## 2020-07-26 DIAGNOSIS — I129 Hypertensive chronic kidney disease with stage 1 through stage 4 chronic kidney disease, or unspecified chronic kidney disease: Secondary | ICD-10-CM | POA: Diagnosis present

## 2020-07-26 DIAGNOSIS — M1990 Unspecified osteoarthritis, unspecified site: Secondary | ICD-10-CM | POA: Diagnosis present

## 2020-07-26 DIAGNOSIS — M25572 Pain in left ankle and joints of left foot: Secondary | ICD-10-CM | POA: Diagnosis not present

## 2020-07-26 DIAGNOSIS — R69 Illness, unspecified: Secondary | ICD-10-CM | POA: Diagnosis not present

## 2020-07-26 DIAGNOSIS — Z923 Personal history of irradiation: Secondary | ICD-10-CM

## 2020-07-26 DIAGNOSIS — R531 Weakness: Secondary | ICD-10-CM | POA: Diagnosis not present

## 2020-07-26 DIAGNOSIS — Z20822 Contact with and (suspected) exposure to covid-19: Secondary | ICD-10-CM | POA: Diagnosis present

## 2020-07-26 DIAGNOSIS — Z806 Family history of leukemia: Secondary | ICD-10-CM

## 2020-07-26 DIAGNOSIS — H409 Unspecified glaucoma: Secondary | ICD-10-CM | POA: Diagnosis present

## 2020-07-26 LAB — CBC WITH DIFFERENTIAL/PLATELET
Abs Immature Granulocytes: 0.03 10*3/uL (ref 0.00–0.07)
Basophils Absolute: 0 10*3/uL (ref 0.0–0.1)
Basophils Relative: 0 %
Eosinophils Absolute: 0 10*3/uL (ref 0.0–0.5)
Eosinophils Relative: 0 %
HCT: 35.3 % — ABNORMAL LOW (ref 36.0–46.0)
Hemoglobin: 11.7 g/dL — ABNORMAL LOW (ref 12.0–15.0)
Immature Granulocytes: 0 %
Lymphocytes Relative: 9 %
Lymphs Abs: 0.7 10*3/uL (ref 0.7–4.0)
MCH: 32.1 pg (ref 26.0–34.0)
MCHC: 33.1 g/dL (ref 30.0–36.0)
MCV: 97 fL (ref 80.0–100.0)
Monocytes Absolute: 0.9 10*3/uL (ref 0.1–1.0)
Monocytes Relative: 12 %
Neutro Abs: 5.7 10*3/uL (ref 1.7–7.7)
Neutrophils Relative %: 79 %
Platelets: 309 10*3/uL (ref 150–400)
RBC: 3.64 MIL/uL — ABNORMAL LOW (ref 3.87–5.11)
RDW: 14.3 % (ref 11.5–15.5)
WBC: 7.3 10*3/uL (ref 4.0–10.5)
nRBC: 0 % (ref 0.0–0.2)

## 2020-07-26 LAB — BASIC METABOLIC PANEL
Anion gap: 9 (ref 5–15)
Anion gap: 8 (ref 5–15)
BUN: 14 mg/dL (ref 8–23)
BUN: 13 mg/dL (ref 8–23)
CO2: 26 mmol/L (ref 22–32)
CO2: 29 mmol/L (ref 22–32)
Calcium: 8.6 mg/dL — ABNORMAL LOW (ref 8.9–10.3)
Calcium: 9.2 mg/dL (ref 8.9–10.3)
Chloride: 85 mmol/L — ABNORMAL LOW (ref 98–111)
Chloride: 89 mmol/L — ABNORMAL LOW (ref 98–111)
Creatinine, Ser: 0.69 mg/dL (ref 0.44–1.00)
Creatinine, Ser: 0.81 mg/dL (ref 0.44–1.00)
GFR calc Af Amer: >60 mL/min (ref >60)
GFR calc Af Amer: >60 mL/min (ref >60)
GFR calc non Af Amer: >60 mL/min (ref >60)
GFR calc non Af Amer: >60 mL/min (ref >60)
Glucose, Bld: 93 mg/dL (ref 70–99)
Glucose, Bld: 93 mg/dL (ref 70–99)
Potassium: 4 mmol/L (ref 3.5–5.1)
Potassium: 4.6 mmol/L (ref 3.5–5.1)
Sodium: 120 mmol/L — ABNORMAL LOW (ref 135–145)
Sodium: 126 mmol/L — ABNORMAL LOW (ref 135–145)

## 2020-07-26 LAB — URINALYSIS, ROUTINE W REFLEX MICROSCOPIC
Bilirubin Urine: NEGATIVE
Glucose, UA: NEGATIVE mg/dL
Hgb urine dipstick: NEGATIVE
Ketones, ur: NEGATIVE mg/dL
Nitrite: NEGATIVE
Protein, ur: NEGATIVE mg/dL
Specific Gravity, Urine: 1.002 — ABNORMAL LOW (ref 1.005–1.030)
pH: 8 (ref 5.0–8.0)

## 2020-07-26 LAB — SARS CORONAVIRUS 2 BY RT PCR (HOSPITAL ORDER, PERFORMED IN ~~LOC~~ HOSPITAL LAB): SARS Coronavirus 2: NEGATIVE

## 2020-07-26 LAB — OSMOLALITY, URINE: Osmolality, Ur: 121 mOsm/kg — ABNORMAL LOW (ref 300–900)

## 2020-07-26 LAB — CREATININE, URINE, RANDOM: Creatinine, Urine: <10 mg/dL

## 2020-07-26 LAB — TSH: TSH: 2.338 u[IU]/mL (ref 0.350–4.500)

## 2020-07-26 LAB — SODIUM, URINE, RANDOM: Sodium, Ur: 38 mmol/L

## 2020-07-26 MED ORDER — ONDANSETRON HCL 4 MG PO TABS: 4.0000 mg | ORAL_TABLET | Freq: Four times a day (QID) | ORAL | Status: DC | PRN

## 2020-07-26 MED ORDER — DULOXETINE HCL 30 MG PO CPEP: 30.0000 mg | ORAL_CAPSULE | Freq: Every day | ORAL | Status: DC

## 2020-07-26 MED ORDER — ACETAMINOPHEN 325 MG PO TABS
650.0000 mg | ORAL_TABLET | Freq: Four times a day (QID) | ORAL | Status: DC | PRN
  Administered 2020-07-26 – 2020-07-29 (×8): 650 mg via ORAL
  Filled 2020-07-26 (×8): qty 2

## 2020-07-26 MED ORDER — LATANOPROST 0.005 % OP SOLN
1.0000 [drp] | Freq: Every day | OPHTHALMIC | Status: DC
  Administered 2020-07-27 – 2020-07-28 (×3): 1 [drp] via OPHTHALMIC
  Filled 2020-07-26: qty 2.5

## 2020-07-26 MED ORDER — ACETAMINOPHEN 650 MG RE SUPP: 650.0000 mg | Freq: Four times a day (QID) | RECTAL | Status: DC | PRN

## 2020-07-26 MED ORDER — SODIUM CHLORIDE 0.9 % IV SOLN: INTRAVENOUS | Status: AC

## 2020-07-26 MED ORDER — PANTOPRAZOLE SODIUM 40 MG PO TBEC
40.0000 mg | DELAYED_RELEASE_TABLET | Freq: Every day | ORAL | Status: DC
  Administered 2020-07-27 – 2020-07-29 (×3): 40 mg via ORAL
  Filled 2020-07-26 (×3): qty 1

## 2020-07-26 MED ORDER — ONDANSETRON HCL 4 MG/2ML IJ SOLN: 4.0000 mg | Freq: Four times a day (QID) | INTRAMUSCULAR | Status: DC | PRN

## 2020-07-26 MED ORDER — ATENOLOL 25 MG PO TABS
25.0000 mg | ORAL_TABLET | Freq: Every day | ORAL | Status: DC
  Administered 2020-07-27 – 2020-07-29 (×3): 25 mg via ORAL
  Filled 2020-07-26 (×3): qty 1

## 2020-07-26 MED ORDER — SODIUM CHLORIDE 0.9 % IV SOLN: INTRAVENOUS | Status: DC

## 2020-07-26 MED ORDER — SODIUM CHLORIDE 1 G PO TABS
1.0000 g | ORAL_TABLET | Freq: Two times a day (BID) | ORAL | Status: DC
  Administered 2020-07-27 – 2020-07-29 (×6): 1 g via ORAL
  Filled 2020-07-26 (×6): qty 1

## 2020-07-26 MED ORDER — ENOXAPARIN SODIUM 40 MG/0.4ML ~~LOC~~ SOLN
40.0000 mg | SUBCUTANEOUS | Status: DC
  Administered 2020-07-26 – 2020-07-28 (×3): 40 mg via SUBCUTANEOUS
  Filled 2020-07-26 (×3): qty 0.4

## 2020-07-26 MED ORDER — GABAPENTIN 100 MG PO CAPS
100.0000 mg | ORAL_CAPSULE | Freq: Two times a day (BID) | ORAL | Status: DC
  Administered 2020-07-26 – 2020-07-29 (×6): 100 mg via ORAL
  Filled 2020-07-26 (×6): qty 1

## 2020-07-26 NOTE — ED Triage Notes (Signed)
Patient arrived by EMS from [facility]). Patient coming for abnormal lab result (low sodium level: 119).   Alert and oriented x 4.   VS WDL per EMS.

## 2020-07-26 NOTE — ED Provider Notes (Signed)
[facility] DEPT Provider Note   CSN: [id number] Arrival date & time: 07/26/20  1201     History Chief Complaint  Patient presents with  . Abnormal Lab    Low Sodium Level     [name] is a 86 y.o. female.  86 year old female presents due to increasing weakness from her hyponatremia.  Has been done with hyponatremia for several weeks and today it is worse.  No clear etiology has been found.  She denies any emesis or abdominal discomfort.  No diarrhea.  Has been constipated actually.  No chest or abdominal discomfort.  No treatment for this use prior to arrival        Past Medical History:  Diagnosis Date  . Allergic rhinitis   . Anemia    NOS  . Anxiety   . Biceps tendon rupture    Bilateral  . Breast cancer ([place]) 2001   Left s/p lumpectomy and XRT   . Bronchiectasis ([place]) 2014   Noted on chest x-ray  . Complication of anesthesia    slow to wake up  . Cough 2014  . Depression   . Diverticulosis   . Elevated liver enzymes    Biopsy consistent with steatohepatitis  . Fundic gland polyps of stomach, benign   . GERD (gastroesophageal reflux disease) 06/2007   EGD Dr Gala Romney >sm HH, multiple fundic gland polyps  . Glaucoma 2013   both eyes  . Hemorrhoids   . Hiatal hernia   . Hx of radiation therapy 07/17/10 to 07/21/10   SRS LLL lung  . Hyperlipidemia   . Insomnia   . Low back pain    scoliosis  . Lung cancer ([place]) 05/2010   Left 2011 - rad x 3  . Lymphedema    Left arm  . Osteoarthritis   . Osteopenia 10/25/2016  . Osteoporosis, senile   . Overactive bladder   . Pneumonia    RLL with sepsis  . PSVT (paroxysmal supraventricular tachycardia) ([place])   . Rheumatic fever    Age 33  . Right thyroid nodule   . Steatohepatitis    liver bx  . Type 2 diabetes mellitus [facility])     Patient Active Problem List   Diagnosis Date Noted  . Right hip pain 07/13/2020  . T2DM (type 2 diabetes mellitus) ([place]) 03/16/2020  . Postmenopausal  symptoms 03/16/2020  . CKD (chronic kidney disease) stage 3, GFR 30-59 ml/min 07/29/2019  . Pain in gums 07/22/2019  . Amaurosis fugax of right eye 01/15/2019  . Glaucoma 01/14/2019  . Macular degeneration 01/14/2019  . Chronic diastolic CHF (congestive heart failure) ([place]) 08/13/2018  . Pulmonary hypertension, primary ([place]) 07/31/2018  . Neuropathy 06/20/2018  . Paroxysmal atrial fibrillation ([place]) 06/20/2018  . Iron deficiency 01/21/2018  . Fall 01/09/2018  . Advanced care planning/counseling discussion 11/20/2017  . Melena   . Diverticulosis of colon with hemorrhage   . Debility 04/03/2017  . Hyponatremia 07/09/2015  . Hearing loss of both ears 04/08/2014  . HTN (hypertension) 03/27/2014  . Slow transit constipation 03/27/2014  . Skin lesion of face 03/27/2014  . Rotator cuff syndrome of right shoulder 09/30/2012  . Breast cancer ([place])   . Lung cancer, lower lobe ([place]) 10/25/2011  . PSVT (paroxysmal supraventricular tachycardia) ([place]) 03/29/2011  . INTERDIGITAL NEUROMA 03/28/2010  . IMPINGEMENT SYNDROME 03/28/2010  . THYROID NODULE, RIGHT 09/06/2008  . LEG EDEMA, BILATERAL 12/22/2007  . Depression with anxiety 07/31/2007  . Other specified disorders of adrenal  gland ([place]) 05/14/2007  . Hyperlipidemia 01/28/2007  . Blood loss anemia 01/28/2007  . Allergic rhinitis 01/28/2007  . GERD (gastroesophageal reflux disease) 01/28/2007  . OVERACTIVE BLADDER 01/28/2007  . Osteoarthritis 01/28/2007  . Pain in lower back 01/28/2007  . Osteoporosis 01/28/2007    Past Surgical History:  Procedure Laterality Date  . ABDOMINAL HYSTERECTOMY  1964  . APPENDECTOMY  1964  . BIOPSY THYROID  11/2008  . BREAST BIOPSY     X 3 w/ cystectomy  . BREAST LUMPECTOMY  2001  . CATARACT EXTRACTION Bilateral 2003   Lens implant Dr. Charise Killian  . CHOLECYSTECTOMY  1965  . COLONOSCOPY  09/11/2011   Procedure: COLONOSCOPY;  Surgeon: Dorothyann Peng, MD;  Location[facility];  Service: Endoscopy;   Laterality: N/A;  . COLONOSCOPY  2005 /2012   Dr. Lucianne Muss- diverticulosis, ext hemorrhoids.  . COLONOSCOPY WITH PROPOFOL N/A 11/09/2017   Procedure: COLONOSCOPY WITH PROPOFOL;  Surgeon: Mauri Pole, MD;  Location: [facility] ENDOSCOPY;  Service: Endoscopy;  Laterality: N/A;  . COLONOSCOPY WITH PROPOFOL N/A 11/12/2017   Procedure: COLONOSCOPY WITH PROPOFOL;  Surgeon: Ladene Artist, MD;  Location: [facility] ENDOSCOPY;  Service: Endoscopy;  Laterality: N/A;  . CYSTOSCOPY  2007  . ESOPHAGOGASTRODUODENOSCOPY  09/11/2011   Procedure: ESOPHAGOGASTRODUODENOSCOPY (EGD);  Surgeon: Dorothyann Peng, MD;  Location: [facility];  Service: Endoscopy;  Laterality: N/A;  . ESOPHAGOGASTRODUODENOSCOPY (EGD) WITH PROPOFOL N/A 11/08/2017   Procedure: ESOPHAGOGASTRODUODENOSCOPY (EGD) WITH PROPOFOL;  Surgeon: Mauri Pole, MD;  Location: [facility] END[facility];  Service: Endoscopy;  Laterality: N/A;  . LIVER BIOPSY  2008  . LUNG BIOPSY Left 06/06/2010  . REVISION TOTAL HIP ARTHROPLASTY  2007   Left  . SKIN CANCER EXCISION     nose and left lower cheek  . TONSILLECTOMY  1930  . TOTAL HIP ARTHROPLASTY Bilateral 2005 & 2007    Dr. Earl Lagos. Aline Brochure      OB History   No obstetric history on file.     Family History  Problem Relation Age of Onset  . Heart failure Mother   . Osteoporosis Mother   . Hypertension Father   . Stroke Father   . Heart failure Father   . Leukemia Brother   . Heart failure Brother   . Cancer Maternal Grandmother   . Stroke Maternal Grandfather   . Cancer Other     Social History   Tobacco Use  . Smoking status: Never Smoker  . Smokeless tobacco: Never Used  . Tobacco comment: 2nd hand-husband  Vaping Use  . Vaping Use: Never used  Substance Use Topics  . Alcohol use: No  . Drug use: No    Home Medications Prior to Admission medications   Medication Sig Start Date End Date Taking? Authorizing Provider  acetaminophen (TYLENOL) 500 MG tablet Take 500 mg by mouth in the  morning, at noon, and at bedtime.     [provider]  atenolol (TENORMIN) 25 MG tablet Take 1 tablet (25 mg total) by mouth daily. 03/05/19   [place] Donnamarie Poag, DO  B Complex Vitamins (B COMPLEX-B12) TABS Take 1 tablet by mouth daily. 03/05/19   [place] Donnamarie Poag, DO  diphenhydrAMINE (BENADRYL) 25 mg capsule Take 25 mg by mouth daily as needed.     [provider]  DULoxetine (CYMBALTA) 30 MG capsule Take 1 capsule (30 mg total) by mouth daily. 03/05/19   [place] Donnamarie Poag, DO  Estradiol (IMVEXXY MAINTENANCE PACK) 4 MCG INST Place 4 mcg vaginally  once a week. On Thursday    [provider]  ferrous sulfate 220 (44 Fe) MG/5ML solution Take 220 mg by mouth daily. 5 mL by mouth daily with meal Once A Day    [provider]  folic acid (FOLVITE) 852 MCG tablet Take 1 tablet (400 mcg total) by mouth daily. 03/05/19   [place] Donnamarie Poag, DO  gabapentin (NEURONTIN) 100 MG capsule Take 1 capsule (100 mg total) by mouth 2 (two) times daily. 03/05/19   [place] Eric J, DO  latanoprost (XALATAN) 0.005 % ophthalmic solution Place 1 drop into both eyes at bedtime. 03/05/19   [place] Donnamarie Poag, DO  magnesium oxide (MAG-OX) 400 (241.3 Mg) MG tablet Take 1 tablet (400 mg total) by mouth 2 (two) times daily. 03/05/19   [place] Donnamarie Poag, DO  Multiple Vitamins-Minerals (PRESERVISION AREDS 2) CAPS Take 1 capsule by mouth 2 (two) times daily.    [provider]  mupirocin ointment (BACTROBAN) 2 % SMARTSIG:1 Application Topical 2-3 Times Daily 07/22/20   [provider]  Nutritional Supplements (FEEDING SUPPLEMENT, BOOST BREEZE,) LIQD Take 237 mLs by mouth daily. orange flavor    [provider]  omeprazole (PRILOSEC) 20 MG capsule Take 1 capsule (20 mg total) by mouth daily. 03/05/19   [place] Eric J, DO  polyethylene glycol (MIRALAX / GLYCOLAX) 17 g packet Take 17 g by mouth daily.    [provider]  senna-docusate (SENOKOT S) 8.6-50 MG tablet Take 1 tablet by mouth  daily.    [provider]  simethicone (MYLICON) 80 MG chewable tablet Chew 80 mg by mouth 4 (four) times daily as needed for flatulence.    [provider]  sodium chloride 1 g tablet Take 1 g by mouth daily.    [provider]  traMADol (ULTRAM) 50 MG tablet Take 1 tablet (50 mg total) by mouth every 6 (six) hours as needed. 07/15/20   Ngetich, Dinah C, NP    Allergies    Augmentin [amoxicillin-pot clavulanate], Ibuprofen, Naproxen, Statins, Surgilube [gyne-moistrin], and Tape  Review of Systems   Review of Systems  All other systems reviewed and are negative.   Physical Exam Updated Vital Signs BP (!) 179/80 (BP Location: Right Arm)   Pulse 70   Temp 97.9 F (36.6 C) (Oral)   Resp 20   Ht 1.626 m (5' 4" )   Wt 65.8 kg   SpO2 94%   BMI 24.90 kg/m   Physical Exam Vitals and nursing note reviewed.  Constitutional:      General: She is not in acute distress.    Appearance: Normal appearance. She is well-developed. She is not toxic-appearing.  HENT:     Head: Normocephalic and atraumatic.  Eyes:     General: Lids are normal.     Conjunctiva/sclera: Conjunctivae normal.     Pupils: Pupils are equal, round, and reactive to light.  Neck:     Thyroid: No thyroid mass.     Trachea: No tracheal deviation.  Cardiovascular:     Rate and Rhythm: Normal rate and regular rhythm.     Heart sounds: Normal heart sounds. No murmur heard.  No gallop.   Pulmonary:     Effort: Pulmonary effort is normal. No respiratory distress.     Breath sounds: Normal breath sounds. No stridor. No decreased breath sounds, wheezing, rhonchi or rales.  Abdominal:     General: Bowel sounds are normal. There is no distension.     Palpations: Abdomen is soft.  Tenderness: There is no abdominal tenderness. There is no rebound.  Musculoskeletal:        General: No tenderness. Normal range of motion.     Cervical back: Normal range of motion and neck supple.  Skin:     General: Skin is warm and dry.     Findings: No abrasion or rash.  Neurological:     Mental Status: She is alert and oriented to person, place, and time.     GCS: GCS eye subscore is 4. GCS verbal subscore is 5. GCS motor subscore is 6.     Cranial Nerves: No cranial nerve deficit.     Sensory: No sensory deficit.  Psychiatric:        Speech: Speech normal.        Behavior: Behavior normal.     ED Results / Procedures / Treatments   Labs (all labs ordered are listed, but only abnormal results are displayed) Labs Reviewed  CBC WITH DIFFERENTIAL/PLATELET  BASIC METABOLIC PANEL    EKG None  Radiology US Guided Needle Placement  Result Date: 07/25/2020 Limited diagnostic ultrasound of the anterior hip reveals no obvious joint effusion.  I believe the iliopsoas tendon is intact.   Procedures Procedures (including critical care time)  Medications Ordered in ED Medications  0.9 %  sodium chloride infusion (has no administration in time range)    ED Course  I have reviewed the triage vital signs and the nursing notes.  Pertinent labs & imaging results that were available during my care of the patient were reviewed by me and considered in my medical decision making (see chart for details).    MDM Rules/Calculators/A&P                          Patient here with several 120.  She has symptomatic hyponatremia.  IV fluids started and patient be admitted Final Clinical Impression(s) / ED Diagnoses Final diagnoses:  None    Rx / DC Orders ED Discharge Orders    None       Lacretia Leigh, MD 07/26/20 1437

## 2020-07-26 NOTE — H&P (Signed)
[facility] History and Physical  EMELIE NEWSOM XLK:[id number] DOB: 01-13-1921 DOA: 07/26/2020   PCP: Virgie Dad, MD  Specialists: None  Chief Complaint: Low sodium  HPI: [name] is a 86 y.o. female who lives in an assisted living facility and who has a past medical history of essential hypertension, history of depression, history of glaucoma who has had low sodium levels for many months. She has been stable on one salt tablet per day. She also has a history of arthritis and has been dealing with pain in her right hip for a few weeks. She was started on tramadol 2 weeks ago and was also given a steroid injection about a week ago. Over the last week or so her sodium level has been trending down. Today it was noted to be 119 and so she was sent over to the emergency department. Patient admits to consuming a lot of water over the last 1 week or so. This is because she is feeling thirsty. She feels dry in her mouth. Has not really had any vomiting. Has had a few loose stools. But no more than 1 to 2/day.  In the emergency department she was found to have a sodium of 120. Patient was started on IV fluids. Hospital medicine was called for further management.  Home Medications: Prior to Admission medications   Medication Sig Start Date End Date Taking? Authorizing Provider  acetaminophen (TYLENOL) 500 MG tablet Take 500 mg by mouth in the morning, at noon, and at bedtime.    Yes [provider]  atenolol (TENORMIN) 25 MG tablet Take 1 tablet (25 mg total) by mouth daily. 03/05/19  Yes [place] Eric J, DO  B Complex Vitamins (B COMPLEX-B12) TABS Take 1 tablet by mouth daily. 03/05/19  Yes [place] Eric J, DO  DULoxetine (CYMBALTA) 30 MG capsule Take 1 capsule (30 mg total) by mouth daily. 03/05/19  Yes [place] Eric J, DO  Estradiol (IMVEXXY MAINTENANCE PACK) 4 MCG INST Place 4 mcg vaginally once a week. On Thursday   Yes [provider]  ferrous sulfate 220 (44 Fe) MG/5ML  solution Take 220 mg by mouth daily. 5 mL by mouth daily with meal Once A Day   Yes [provider]  folic acid (FOLVITE) 366 MCG tablet Take 1 tablet (400 mcg total) by mouth daily. 03/05/19  Yes [place] Eric J, DO  gabapentin (NEURONTIN) 100 MG capsule Take 1 capsule (100 mg total) by mouth 2 (two) times daily. 03/05/19  Yes [place] Eric J, DO  latanoprost (XALATAN) 0.005 % ophthalmic solution Place 1 drop into both eyes at bedtime. 03/05/19  Yes [place] Eric J, DO  magnesium oxide (MAG-OX) 400 (241.3 Mg) MG tablet Take 1 tablet (400 mg total) by mouth 2 (two) times daily. 03/05/19  Yes [place] Donnamarie Poag, DO  Multiple Vitamins-Minerals (PRESERVISION AREDS 2) CAPS Take 1 capsule by mouth 2 (two) times daily.   Yes [provider]  mupirocin ointment (BACTROBAN) 2 % Apply 1 application topically daily as needed (nasal sores).  07/22/20  Yes [provider]  omeprazole (PRILOSEC) 20 MG capsule Take 1 capsule (20 mg total) by mouth daily. 03/05/19  Yes [place] Eric J, DO  polyethylene glycol (MIRALAX / GLYCOLAX) 17 g packet Take 17 g by mouth daily.   Yes [provider]  senna-docusate (SENOKOT S) 8.6-50 MG tablet Take 1 tablet by mouth daily.   Yes [provider]  simethicone (MYLICON) 80 MG chewable tablet Chew 80  mg by mouth 4 (four) times daily as needed for flatulence.   Yes [provider]  sodium chloride 1 g tablet Take 1 g by mouth 2 (two) times daily with a meal.    Yes [provider]  traMADol (ULTRAM) 50 MG tablet Take 1 tablet (50 mg total) by mouth every 6 (six) hours as needed. 07/15/20  Yes Ngetich, Dinah C, NP    Allergies:  Allergies  Allergen Reactions  . Augmentin [Amoxicillin-Pot Clavulanate] Other (See Comments)    unknown  . Ibuprofen Hives  . Naproxen Other (See Comments)    Unknown Tears stomach up  . Nsaids     Tears stomach up  . Statins Other (See Comments)    Feels like knives in stomach  . Surgilube  [Gyne-Moistrin]     Rectal itching, burning  . Tape Rash    Past Medical History: Past Medical History:  Diagnosis Date  . Allergic rhinitis   . Anemia    NOS  . Anxiety   . Biceps tendon rupture    Bilateral  . Breast cancer ([place]) 2001   Left s/p lumpectomy and XRT   . Bronchiectasis ([place]) 2014   Noted on chest x-ray  . Complication of anesthesia    slow to wake up  . Cough 2014  . Depression   . Diverticulosis   . Elevated liver enzymes    Biopsy consistent with steatohepatitis  . Fundic gland polyps of stomach, benign   . GERD (gastroesophageal reflux disease) 06/2007   EGD Dr Gala Romney >sm HH, multiple fundic gland polyps  . Glaucoma 2013   both eyes  . Hemorrhoids   . Hiatal hernia   . Hx of radiation therapy 07/17/10 to 07/21/10   [facility] LLL lung  . Hyperlipidemia   . Insomnia   . Low back pain    scoliosis  . Lung cancer ([place]) 05/2010   Left 2011 - rad x 3  . Lymphedema    Left arm  . Osteoarthritis   . Osteopenia 10/25/2016  . Osteoporosis, senile   . Overactive bladder   . Pneumonia    RLL with sepsis  . PSVT (paroxysmal supraventricular tachycardia) ([place])   . Rheumatic fever    Age 49  . Right thyroid nodule   . Steatohepatitis    liver bx  . Type 2 diabetes mellitus ([place])     Past Surgical History:  Procedure Laterality Date  . ABDOMINAL HYSTERECTOMY  1964  . APPENDECTOMY  1964  . BIOPSY THYROID  11/2008  . BREAST BIOPSY     X 3 w/ cystectomy  . BREAST LUMPECTOMY  2001  . CATARACT EXTRACTION Bilateral 2003   Lens implant Dr. Charise Killian  . CHOLECYSTECTOMY  1965  . COLONOSCOPY  09/11/2011   Procedure: COLONOSCOPY;  Surgeon: Dorothyann Peng, MD;  [facility];  Service: Endoscopy;  Laterality: N/A;  . COLONOSCOPY  2005 /2012   Dr. Lucianne Muss- diverticulosis, ext hemorrhoids.  . COLONOSCOPY WITH PROPOFOL N/A 11/09/2017   Procedure: COLONOSCOPY WITH PROPOFOL;  Surgeon: Mauri Pole, MD;  Location: [facility];  Service: Endoscopy;  Laterality:  N/A;  . COLONOSCOPY WITH PROPOFOL N/A 11/12/2017   Procedure: COLONOSCOPY WITH PROPOFOL;  Surgeon: Ladene Artist, MD;  Location: [facility];  Service: Endoscopy;  Laterality: N/A;  . CYSTOSCOPY  2007  . ESOPHAGOGASTRODUODENOSCOPY  09/11/2011   Procedure: ESOPHAGOGASTRODUODENOSCOPY (EGD);  Surgeon: Dorothyann Peng, MD;  [facility];  Service: Endoscopy;  Laterality: N/A;  .  ESOPHAGOGASTRODUODENOSCOPY (EGD) WITH PROPOFOL N/A 11/08/2017   Procedure: ESOPHAGOGASTRODUODENOSCOPY (EGD) WITH PROPOFOL;  Surgeon: Mauri Pole, MD;  Location: [facility];  Service: Endoscopy;  Laterality: N/A;  . LIVER BIOPSY  2008  . LUNG BIOPSY Left 06/06/2010  . REVISION TOTAL HIP ARTHROPLASTY  2007   Left  . SKIN CANCER EXCISION     nose and left lower cheek  . TONSILLECTOMY  1930  . TOTAL HIP ARTHROPLASTY Bilateral 2005 & 2007    Dr. Earl Lagos. Aline Brochure     Social History: Patient lives in an assisted living facility. No history of smoking alcohol use illicit drug use.  Family History:  Family History  Problem Relation Age of Onset  . Heart failure Mother   . Osteoporosis Mother   . Hypertension Father   . Stroke Father   . Heart failure Father   . Leukemia Brother   . Heart failure Brother   . Cancer Maternal Grandmother   . Stroke Maternal Grandfather   . Cancer Other      Review of Systems - History obtained from the patient and And her son General ROS: positive for  - fatigue Psychological ROS: negative Ophthalmic ROS: negative ENT ROS: negative Allergy and Immunology ROS: negative Hematological and Lymphatic ROS: negative Endocrine ROS: negative Respiratory ROS: no cough, shortness of breath, or wheezing Cardiovascular ROS: no chest pain or dyspnea on exertion Gastrointestinal ROS: 1-2 episodes of loose stool every day for the past few days. Genito-Urinary ROS: no dysuria, trouble voiding, or hematuria Musculoskeletal ROS: Pain in the right hip ongoing for a few  weeks. Neurological ROS: no TIA or stroke symptoms Dermatological ROS: negative  Physical Examination  Vitals:   07/26/20 1228 07/26/20 1250 07/26/20 1500 07/26/20 1747  BP: (!) 179/90 (!) 179/80 (!) 163/96 137/72  Pulse: 70 70 66 (!) 58  Resp: 20 20 15 13   Temp: 97.9 F (36.6 C) 97.9 F (36.6 C)  97.9 F (36.6 C)  TempSrc: Oral Oral  Oral  SpO2: 94% 94% 95% 91%  Weight: 65.8 kg     Height: 5' 4"  (1.626 m)       BP 137/72 (BP Location: Right Arm)   Pulse (!) 58   Temp 97.9 F (36.6 C) (Oral)   Resp 13   Ht 5' 4"  (1.626 m)   Wt 65.8 kg   SpO2 91%   BMI 24.90 kg/m   General appearance: alert, cooperative, appears stated age, fatigued and no distress Head: Normocephalic, without obvious abnormality, atraumatic Eyes: conjunctivae/corneas clear. PERRL, EOM's intact.  Throat: Dry mucous membranes noted Neck: no adenopathy, no carotid bruit, no JVD, supple, symmetrical, trachea midline and thyroid not enlarged, symmetric, no tenderness/mass/nodules Resp: clear to auscultation bilaterally Cardio: regular rate and rhythm, S1, S2 normal, no murmur, click, rub or gallop GI: soft, non-tender; bowel sounds normal; no masses,  no organomegaly Extremities: Limited range of motion of the right leg. Pulses: 2+ and symmetric Skin: Skin color, texture, turgor normal. No rashes or lesions Lymph nodes: Cervical, supraclavicular, and axillary nodes normal. Neurologic: No focal neurological deficits noted. She is fatigued.    Labs on Admission: I have personally reviewed following labs and imaging studies  CBC: Recent Labs  Lab 07/21/20 0000 07/26/20 1243  WBC 8.0 7.3  NEUTROABS 6,392 5.7  HGB 11.3* 11.7*  HCT 34* 35.3*  MCV  --  97.0  PLT 344 450   Basic Metabolic Panel: Recent Labs  Lab 07/21/20 0000 07/26/20 1243  NA 127* 120*  K 4.6 4.0  CL 87* 85*  CO2  --  26  GLUCOSE  --  93  BUN 13 14  CREATININE 0.8 0.69  CALCIUM  --  8.6*   GFR: Estimated Creatinine  Clearance: 37.5 mL/min (by C-G formula based on SCr of 0.69 mg/dL).    My interpretation of Electrocardiogram: Sinus rhythm in the 60s. Normal axis. Intervals are normal. No concerning ST or T wave changes noted   Problem List  Principal Problem:   Hyponatremia   Assessment: This is a 86 year old Caucasian female who lives in assisted living facility who was sent to the emergency department for further evaluation and treatment of hyponatremia.  Plan:  Hyponatremia Patient does not appear to be hypervolemic. She probably has an element of hypovolemia possibly due to loose stools over the last few days. However she has also been consuming a lot of water. Etiology for hyponatremia is likely multifactorial including mild hypovolemia as well as excessive water intake. The only 2 new medications that she has been given in the last 2 weeks is tramadol and a steroid injection. She is not noted to be on any diuretic. She is noted to be on Cymbalta which can cause hyponatremia. We will hold it for now. However this is not a new medication for her. Urine studies have been ordered including urine osmolality urine sodium and urine creatinine. Check cortisol level, TSH. Chest x-ray. She has been given normal saline by the emergency department provider. Continue normal saline at low dose.  We will recheck basic metabolic panel tonight. She has been placed on a fluid restriction as well.  At this time patient does not have any alteration in mental status. There is no clear indication to use hypertonic saline.  Patient is already on salt tablets at home for chronic hyponatremia which will be continued. If sodium level continues to drop then we will contact nephrology to assist with management.    Glaucoma Continue with her eyedrops.  Normocytic anemia Hemoglobin is close to baseline. No evidence of overt blood loss.  Essential hypertension Continue with atenolol.  Right hip pain She has a history  of arthritis. She is under the care of orthopedics for her right hip pain. She recently was injected with steroids. PT and OT evaluation.   DVT Prophylaxis: Lovenox Code Status: Discussed with patient and her son. She is DNR. Family Communication: Discussed with patient and her son Disposition: Hopefully return back to her ALF when improved Consults called: None yet Admission Status: Status is: Inpatient  Remains inpatient appropriate because:IV treatments appropriate due to intensity of illness or inability to take PO and Inpatient level of care appropriate due to severity of illness   Dispo: The patient is from: ALF              Anticipated d/c is to: ALF              Anticipated d/c date is: 2 days              Patient currently is not medically stable to d/c.   Severity of Illness: The appropriate patient status for this patient is INPATIENT. Inpatient status is judged to be reasonable and necessary in order to provide the required intensity of service to ensure the patient's safety. The patient's presenting symptoms, physical exam findings, and initial radiographic and laboratory data in the context of their chronic comorbidities is felt to place them at high risk for further clinical deterioration. Furthermore, it is  not anticipated that the patient will be medically stable for discharge from the hospital within 2 midnights of admission. The following factors support the patient status of inpatient.   " The patient's presenting symptoms include fatigue. " The worrisome physical exam findings include dry mucous membranes. " The initial radiographic and laboratory data are worrisome because of hyponatremia. " The chronic co-morbidities include diabetes mellitus.   * I certify that at the point of admission it is my clinical judgment that the patient will require inpatient hospital care spanning beyond 2 midnights from the point of admission due to high intensity of service, high risk  for further deterioration and high frequency of surveillance required.*  Further management decisions will depend on results of further testing and patient's response to treatment.   Deliliah Spranger [employer]  [facility] operational officer on [email or web address]  07/26/2020, 6:32 PM

## 2020-07-27 ENCOUNTER — Inpatient Hospital Stay (HOSPITAL_COMMUNITY): Payer: Medicare HMO

## 2020-07-27 LAB — COMPREHENSIVE METABOLIC PANEL
ALT: 26 U/L (ref 0–44)
AST: 34 U/L (ref 15–41)
Albumin: 3.1 g/dL — ABNORMAL LOW (ref 3.5–5.0)
Alkaline Phosphatase: 89 U/L (ref 38–126)
Anion gap: 9 (ref 5–15)
BUN: 14 mg/dL (ref 8–23)
CO2: 25 mmol/L (ref 22–32)
Calcium: 8.6 mg/dL — ABNORMAL LOW (ref 8.9–10.3)
Chloride: 93 mmol/L — ABNORMAL LOW (ref 98–111)
Creatinine, Ser: 0.67 mg/dL (ref 0.44–1.00)
GFR calc Af Amer: >60 mL/min (ref >60)
GFR calc non Af Amer: >60 mL/min (ref >60)
Glucose, Bld: 98 mg/dL (ref 70–99)
Potassium: 4 mmol/L (ref 3.5–5.1)
Sodium: 127 mmol/L — ABNORMAL LOW (ref 135–145)
Total Bilirubin: 0.5 mg/dL (ref 0.3–1.2)
Total Protein: 6.8 g/dL (ref 6.5–8.1)

## 2020-07-27 LAB — CORTISOL: Cortisol, Plasma: 7.8 ug/dL

## 2020-07-27 LAB — CBC
HCT: 36.1 % (ref 36.0–46.0)
Hemoglobin: 11.7 g/dL — ABNORMAL LOW (ref 12.0–15.0)
MCH: 32.1 pg (ref 26.0–34.0)
MCHC: 32.4 g/dL (ref 30.0–36.0)
MCV: 99.2 fL (ref 80.0–100.0)
Platelets: 262 10*3/uL (ref 150–400)
RBC: 3.64 MIL/uL — ABNORMAL LOW (ref 3.87–5.11)
RDW: 14.6 % (ref 11.5–15.5)
WBC: 7 10*3/uL (ref 4.0–10.5)
nRBC: 0 % (ref 0.0–0.2)

## 2020-07-27 MED ORDER — OXYCODONE HCL 5 MG PO TABS
2.5000 mg | ORAL_TABLET | ORAL | Status: DC | PRN
  Administered 2020-07-27 – 2020-07-29 (×5): 2.5 mg via ORAL
  Filled 2020-07-27 (×4): qty 1

## 2020-07-27 MED ORDER — OXYCODONE HCL 5 MG PO TABS
5.0000 mg | ORAL_TABLET | Freq: Four times a day (QID) | ORAL | Status: DC | PRN
  Filled 2020-07-27: qty 1

## 2020-07-27 MED ORDER — OXYCODONE HCL 5 MG PO TABS
2.5000 mg | ORAL_TABLET | Freq: Four times a day (QID) | ORAL | Status: DC | PRN
  Administered 2020-07-27: 2.5 mg via ORAL
  Filled 2020-07-27: qty 1

## 2020-07-27 MED ORDER — CEPHALEXIN 500 MG PO CAPS
500.0000 mg | ORAL_CAPSULE | Freq: Four times a day (QID) | ORAL | Status: DC
  Administered 2020-07-27 – 2020-07-29 (×9): 500 mg via ORAL
  Filled 2020-07-27 (×9): qty 1

## 2020-07-27 MED ORDER — DICLOFENAC SODIUM 1 % EX GEL
2.0000 g | Freq: Four times a day (QID) | CUTANEOUS | Status: DC
  Administered 2020-07-27 – 2020-07-29 (×10): 2 g via TOPICAL
  Filled 2020-07-27: qty 100

## 2020-07-27 NOTE — Progress Notes (Signed)
PROGRESS NOTE    [name]  GYF:[id number] DOB: 1921-04-01 DOA: 07/26/2020 PCP: [name] Dad, MD   Chief Complaint  Patient presents with  . Abnormal Lab    Low Sodium Level     Brief Narrative:  [name] is [name] 86 y.o. female who lives in an assisted living facility and who has [name] past medical history of essential hypertension, history of depression, history of glaucoma who has had low sodium levels for many months. She has been stable on one salt tablet per day. She also has [name] history of arthritis and has been dealing with pain in her right hip for [name] few weeks. She was started on tramadol 2 weeks ago and was also given [name] steroid injection about [name] week ago. Over the last week or so her sodium level has been trending down. Today it was noted to be 119 and so she was sent over to the emergency department. Patient admits to consuming [name] Gaskill lot of water over the last 1 week or so. This is because she is feeling thirsty. She feels dry in her mouth. Has not really had any vomiting. Has had [name] few loose stools. But no more than 1 to 2/day.  In the emergency department she was found to have [name] sodium of 120. Patient was started on IV fluids. Hospital medicine was called for further management.  Assessment & Plan:   Principal Problem:   Hyponatremia  Hyponatremia Patient does not appear to be hypervolemic. Suspect hypovolemia with possible contribution from increased free water intake as well as possible SIADH?  Tramadol could be contributing as well. Improved today, follow with salt tabs and fluid restriction Will plan to resume cymbalta at discharge as this has been chronic medication Continue 1.2 L free water restriction as well as salt tabs from home Hold tramadol.  Normal TSH, cortisol wnl at 7.8.  Will check am cortisol. Urine sodium 38, urine osm 121  Right Toe Pain  Cellulitis: pt ran over R great toe with walker with area of erythema/abrasion - negative plain films Will treat  empirically for cellulitis Continue pain med regimen   Left Pleural Effusion: seems asymptomatic from this, would follow outpatient with PCP for further follow up/workup as needed - discussed with pt/fam  Glaucoma Continue with her eyedrops.  Normocytic anemia Hemoglobin is close to baseline. No evidence of overt blood loss.  Essential hypertension Continue with atenolol.  Right hip pain She has [name] history of arthritis. She is under the care of orthopedics for her right hip pain. She recently was injected with steroids. PT and OT evaluation.  DVT prophylaxis: lovenox Code Status: DNR Family Communication: son at bedside Disposition:   Status is: Inpatient  Remains inpatient appropriate because:Inpatient level of care appropriate due to severity of illness   Dispo: The patient is from: ALF              Anticipated d/c is to: ALF              Anticipated d/c date is: 1 day              Patient currently is not medically stable to d/c.   Consultants:   none  Procedures:  none  Antimicrobials: Anti-infectives (From admission, onward)   Start     Dose/Rate Route Frequency Ordered Stop   07/27/20 1130  cephALEXin (KEFLEX) capsule 500 mg     Discontinue     500 mg Oral Every 6 hours  07/27/20 1033 08/01/20 1159      Subjective: C/o R great toe pain  Objective: Vitals:   07/26/20 2013 07/26/20 2357 07/27/20 0402 07/27/20 1439  BP: (!) 164/85 (!) 153/75 (!) 151/83 (!) 144/70  Pulse: 67 (!) 58 62 63  Resp:  17 16 (!) 22  Temp:  98.8 F (37.1 C) 98.2 F (36.8 C) 97.9 F (36.6 C)  TempSrc:    Oral  SpO2:  94% 94% 95%  Weight:      Height:        Intake/Output Summary (Last 24 hours) at 07/27/2020 1519 Last data filed at 07/27/2020 1300 Gross per 24 hour  Intake 610.8 ml  Output 1100 ml  Net -489.2 ml   Filed Weights   07/26/20 1228  Weight: 65.8 kg    Examination:  General exam: Appears calm and comfortable  Respiratory system: Clear to  auscultation. Respiratory effort normal. Cardiovascular system: S1 & S2 heard, RRR.  Gastrointestinal system: Abdomen is nondistended, soft and nontender.  Central nervous system: Alert and oriented. No focal neurological deficits. Extremities: erythema and mild abrasion to R great toe  Data Reviewed: I have personally reviewed following labs and imaging studies  CBC: Recent Labs  Lab 07/21/20 0000 07/26/20 1243 07/27/20 0433  WBC 8.0 7.3 7.0  NEUTROABS 6,392 5.7  --   HGB 11.3* 11.7* 11.7*  HCT 34* 35.3* 36.1  MCV  --  97.0 99.2  PLT 344 309 875    Basic Metabolic Panel: Recent Labs  Lab 07/21/20 0000 07/26/20 1243 07/26/20 1953 07/27/20 0433  NA 127* 120* 126* 127*  K 4.6 4.0 4.6 4.0  CL 87* 85* 89* 93*  CO2  --  26 29 25   GLUCOSE  --  93 93 98  BUN 13 14 13 14   CREATININE 0.8 0.69 0.81 0.67  CALCIUM  --  8.6* 9.2 8.6*    GFR: Estimated Creatinine Clearance: 37.5 mL/min (by C-G formula based on SCr of 0.67 mg/dL).  Liver Function Tests: Recent Labs  Lab 07/27/20 0433  AST 34  ALT 26  ALKPHOS 89  BILITOT 0.5  PROT 6.8  ALBUMIN 3.1*    CBG: No results for input(s): GLUCAP in the last 168 hours.   Recent Results (from the past 240 hour(s))  SARS Coronavirus 2 by RT PCR (hospital order, performed in [facility] hospital lab) Nasopharyngeal Nasopharyngeal Swab     Status: None   Collection Time: 07/26/20  2:36 PM   Specimen: Nasopharyngeal Swab  Result Value Ref Range Status   SARS Coronavirus 2 NEGATIVE NEGATIVE Final    Comment: (NOTE) SARS-CoV-2 target nucleic acids are NOT DETECTED.  The SARS-CoV-2 RNA is generally detectable in upper and lower respiratory specimens during the acute phase of infection. The lowest concentration of SARS-CoV-2 viral copies this assay can detect is 250 copies / mL. [name] negative result does not preclude SARS-CoV-2 infection and should not be used as the sole basis for treatment or other patient management decisions.   [name] negative result may occur with improper specimen collection / handling, submission of specimen other than nasopharyngeal swab, presence of viral mutation(s) within the areas targeted by this assay, and inadequate number of viral copies (<250 copies / mL). Zubin Pontillo negative result must be combined with clinical observations, patient history, and epidemiological information.  Fact Sheet for Patients:   StrictlyIdeas.no  Fact Sheet for Healthcare Providers: BankingDealers.co.za  This test is not yet approved or  cleared by the [place] FDA and  has been authorized for detection and/or diagnosis of SARS-CoV-2 by FDA under an Emergency Use Authorization (EUA).  This EUA will remain in effect (meaning this test can be used) for the duration of the COVID-19 declaration under Section 564(b)(1) of the Act, 21 U.S.C. section 360bbb-3(b)(1), unless the authorization is terminated or revoked sooner.  Performed at [facility], [place] [address], [place], [place] [address]          Radiology Studies: DG CHEST PORT 1 VIEW  Result Date: 07/27/2020 CLINICAL DATA:  Shortness of breath. EXAM: PORTABLE CHEST 1 VIEW COMPARISON:  02/25/2019 FINDINGS: The heart is within normal limits in size given the AP projection and portable technique. Mild tortuosity and calcification of the thoracic aorta. Chronic or recurrent left pleural effusion and left lower lobe atelectasis. Minimal streaky right basilar atelectasis. No pulmonary edema or worrisome pulmonary lesions. The bony thorax is intact IMPRESSION: Chronic or recurrent left pleural effusion and left lower lobe atelectasis. Electronically Signed   By: Marijo Sanes M.D.   On: 07/27/2020 05:45   DG Toe Great Right  Result Date: 07/27/2020 CLINICAL DATA:  Rolled over toe EXAM: RIGHT GREAT TOE COMPARISON:  None. FINDINGS: Osteopenia. No acute fracture or dislocation. Joint spaces and alignment  are maintained for age. No area of erosion or osseous destruction. No unexpected radiopaque foreign body. Soft tissues are unremarkable. IMPRESSION: No acute fracture or dislocation. Electronically Signed   By: Valentino Saxon MD   On: 07/27/2020 11:30        Scheduled Meds: . atenolol  25 mg Oral Daily  . cephALEXin  500 mg Oral Q6H  . diclofenac Sodium  2 g Topical QID  . enoxaparin (LOVENOX) injection  40 mg Subcutaneous Q24H  . gabapentin  100 mg Oral BID  . latanoprost  1 drop Both Eyes QHS  . pantoprazole  40 mg Oral Daily  . sodium chloride  1 g Oral BID WC   Continuous Infusions:   LOS: 1 day    Time spent: over 30 min    Fayrene Helper, MD [facility]   To contact the attending provider between 7A-7P or the covering provider during after hours 7P-7A, please log into the web site [email or web address] and access using universal [name] password for that web site. If you do not have the password, please call the hospital operator.  07/27/2020, 3:19 PM

## 2020-07-27 NOTE — Evaluation (Signed)
Physical Therapy Evaluation Patient Details Name: [name] MRN: [id number] DOB: 06/20/1921 Today's Date: 07/27/2020   History of Present Illness  86 yo female admitted with hyponatremia, R hip and R foot pain. Hx of DM, CHF, breast ca, OA, LBP, lymphedema, osteopenia, osteporosis, PSVT  Clinical Impression  On eval, pt required Mod assist for bed mobility and for lateral scooting at EOB. Mobility is limited by R hip and R foot pain. Son was present during session. Discussed d/c plan-pt is planning to return to her ALF. Explained to her that she very likely will need to arrange for in home assistance outside of HHPT should she return.  May need to consider ST SNF placement if pt is agreeable. Will plan to follow pt during hospital stay and continue to assess mobility.     Follow Up Recommendations SNF vs Home health PT;Supervision/Assistance - 24 hour    Equipment Recommendations  None recommended by PT    Recommendations for Other Services       Precautions / Restrictions Precautions Precautions: Fall Restrictions Weight Bearing Restrictions: No      Mobility  Bed Mobility Overal bed mobility: Needs Assistance Bed Mobility: Supine to Sit;Sit to Supine     Supine to sit: Mod assist;HOB elevated Sit to supine: Mod assist;HOB elevated   General bed mobility comments: Assist for trunk and bil LEs. Increased time. Pt relied heavily on bedrails  Transfers     Transfers: Lateral/Scoot Transfers          Lateral/Scoot Transfers: Mod assist General transfer comment: Lateral scoot towards HOB x 2. Assist to shift weight, lift and shift bottom.  Ambulation/Gait                Stairs            Wheelchair Mobility    Modified Rankin (Stroke Patients Only)       Balance Overall balance assessment: Needs assistance Sitting-balance support: Bilateral upper extremity supported Sitting balance-Leahy Scale: Poor                                        Pertinent Vitals/Pain Pain Assessment: 0-10 Pain Score: 9  Pain Location: R hip, R dorsum of foot Pain Descriptors / Indicators: Tender;Discomfort;Sore Pain Intervention(s): Limited activity within patient's tolerance;Monitored during session;Repositioned    Home Living                        Prior Function                 Hand Dominance        Extremity/Trunk Assessment   Upper Extremity Assessment Upper Extremity Assessment: Defer to OT evaluation    Lower Extremity Assessment Lower Extremity Assessment: Generalized weakness    Cervical / Trunk Assessment Cervical / Trunk Assessment: Kyphotic  Communication      Cognition Arousal/Alertness: Awake/alert Behavior During Therapy: WFL for tasks assessed/performed Overall Cognitive Status: Within Functional Limits for tasks assessed                                        General Comments      Exercises     Assessment/Plan    PT Assessment Patient needs continued PT services  PT Problem List Decreased strength;Decreased mobility;Decreased range  of motion;Decreased activity tolerance;Decreased balance;Decreased knowledge of use of DME;Pain       PT Treatment Interventions Therapeutic activities;Therapeutic exercise;Patient/family education;Balance training;Functional mobility training    PT Goals (Current goals can be found in the Care Plan section)  Acute Rehab PT Goals Patient Stated Goal: less pain. regain mobility/independence PT Goal Formulation: With patient/family Time For Goal Achievement: 08/10/20 Potential to Achieve Goals: Fair    Frequency Min 3X/week   Barriers to discharge Decreased caregiver support      Co-evaluation               AM-PAC PT "6 Clicks" Mobility  Outcome Measure Help needed turning from your back to your side while in a flat bed without using bedrails?: A Lot Help needed moving from lying on your back to sitting on the side  of a flat bed without using bedrails?: A Lot Help needed moving to and from a bed to a chair (including a wheelchair)?: A Lot Help needed standing up from a chair using your arms (e.g., wheelchair or bedside chair)?: A Lot Help needed to walk in hospital room?: Total Help needed climbing 3-5 steps with a railing? : Total 6 Click Score: 10    End of Session   Activity Tolerance: Patient limited by fatigue;Patient limited by pain Patient left: in bed;with call bell/phone within reach;with bed alarm set;with family/visitor present   PT Visit Diagnosis: Muscle weakness (generalized) (M62.81);Pain;Other abnormalities of gait and mobility (R26.89) Pain - Right/Left: Right Pain - part of body: Hip;Ankle and joints of foot    Time: 6629-4765 PT Time Calculation (min) (ACUTE ONLY): 33 min   Charges:   PT Evaluation $PT Eval Low Complexity: 1 Low PT Treatments $Therapeutic Activity: 8-22 mins          Doreatha Massed, PT Acute Rehabilitation  Office: [phone] Pager: [phone]

## 2020-07-28 LAB — COMPREHENSIVE METABOLIC PANEL
ALT: 24 U/L (ref 0–44)
AST: 27 U/L (ref 15–41)
Albumin: 3.2 g/dL — ABNORMAL LOW (ref 3.5–5.0)
Alkaline Phosphatase: 83 U/L (ref 38–126)
Anion gap: 9 (ref 5–15)
BUN: 15 mg/dL (ref 8–23)
CO2: 26 mmol/L (ref 22–32)
Calcium: 8.9 mg/dL (ref 8.9–10.3)
Chloride: 96 mmol/L — ABNORMAL LOW (ref 98–111)
Creatinine, Ser: 0.79 mg/dL (ref 0.44–1.00)
GFR calc Af Amer: >60 mL/min (ref >60)
GFR calc non Af Amer: >60 mL/min (ref >60)
Glucose, Bld: 94 mg/dL (ref 70–99)
Potassium: 4 mmol/L (ref 3.5–5.1)
Sodium: 131 mmol/L — ABNORMAL LOW (ref 135–145)
Total Bilirubin: 0.7 mg/dL (ref 0.3–1.2)
Total Protein: 6.7 g/dL (ref 6.5–8.1)

## 2020-07-28 LAB — CBC WITH DIFFERENTIAL/PLATELET
Abs Immature Granulocytes: 0.03 10*3/uL (ref 0.00–0.07)
Basophils Absolute: 0 10*3/uL (ref 0.0–0.1)
Basophils Relative: 0 %
Eosinophils Absolute: 0.1 10*3/uL (ref 0.0–0.5)
Eosinophils Relative: 1 %
HCT: 38 % (ref 36.0–46.0)
Hemoglobin: 12.2 g/dL (ref 12.0–15.0)
Immature Granulocytes: 0 %
Lymphocytes Relative: 15 %
Lymphs Abs: 1.1 10*3/uL (ref 0.7–4.0)
MCH: 31.6 pg (ref 26.0–34.0)
MCHC: 32.1 g/dL (ref 30.0–36.0)
MCV: 98.4 fL (ref 80.0–100.0)
Monocytes Absolute: 1.1 10*3/uL — ABNORMAL HIGH (ref 0.1–1.0)
Monocytes Relative: 14 %
Neutro Abs: 5.1 10*3/uL (ref 1.7–7.7)
Neutrophils Relative %: 70 %
Platelets: 287 10*3/uL (ref 150–400)
RBC: 3.86 MIL/uL — ABNORMAL LOW (ref 3.87–5.11)
RDW: 14.8 % (ref 11.5–15.5)
WBC: 7.4 10*3/uL (ref 4.0–10.5)
nRBC: 0 % (ref 0.0–0.2)

## 2020-07-28 LAB — MAGNESIUM: Magnesium: 1.9 mg/dL (ref 1.7–2.4)

## 2020-07-28 LAB — PHOSPHORUS: Phosphorus: 3.2 mg/dL (ref 2.5–4.6)

## 2020-07-28 MED ORDER — MAGIC MOUTHWASH
10.0000 mL | Freq: Three times a day (TID) | ORAL | Status: DC | PRN
  Filled 2020-07-28: qty 10

## 2020-07-28 MED ORDER — PROSOURCE PLUS PO LIQD
30.0000 mL | Freq: Two times a day (BID) | ORAL | Status: DC
  Administered 2020-07-29: 30 mL via ORAL
  Filled 2020-07-28: qty 30

## 2020-07-28 MED ORDER — ADULT MULTIVITAMIN W/MINERALS CH
1.0000 | ORAL_TABLET | Freq: Every day | ORAL | Status: DC
  Administered 2020-07-28 – 2020-07-29 (×2): 1 via ORAL
  Filled 2020-07-28 (×2): qty 1

## 2020-07-28 NOTE — Progress Notes (Addendum)
PROGRESS NOTE    [name]  FBP:[id number] DOB: March 19, 1921 DOA: 07/26/2020 PCP: Virgie Dad, MD   Chief Complaint  Patient presents with  . Abnormal Lab    Low Sodium Level     Brief Narrative:  [name] is [name] 86 y.o. female who lives in an assisted living facility and who has [name] past medical history of essential hypertension, history of depression, history of glaucoma who has had low sodium levels for many months. She has been stable on one salt tablet per day. She also has [name] history of arthritis and has been dealing with pain in her right hip for [name] few weeks. She was started on tramadol 2 weeks ago and was also given [name] steroid injection about [name] week ago. Over the last week or so her sodium level has been trending down. Today it was noted to be 119 and so she was sent over to the emergency department. Patient admits to consuming [name] lot of water over the last 1 week or so. This is because she is feeling thirsty. She feels dry in her mouth. Has not really had any vomiting. Has had [name] few loose stools. But no more than 1 to 2/day.  In the emergency department she was found to have [name] sodium of 120. Patient was started on IV fluids. Hospital medicine was called for further management.  Assessment & Plan:   Principal Problem:   Hyponatremia  Hyponatremia Patient does not appear to be hypervolemic. Suspect hypovolemia with possible contribution from increased free water intake as well as possible SIADH?  Tramadol could be contributing as well. Improved today, follow with salt tabs and fluid restriction Will plan to resume cymbalta at discharge as this has been chronic medication Continue 1.2 L free water restriction as well as salt tabs from home Hold tramadol.  Normal TSH, cortisol wnl at 7.8.  Will check am cortisol. Urine sodium 38, urine osm 121  Right Toe Pain  Cellulitis: pt ran over R great toe with walker with area of erythema/abrasion - negative plain films Will treat  empirically for cellulitis Continue pain med regimen  Improving pain  Left Pleural Effusion: seems asymptomatic from this, would follow outpatient with PCP for further follow up/workup as needed - discussed with pt/fam  Glaucoma Continue with her eyedrops.  Normocytic anemia Hemoglobin is close to baseline. No evidence of overt blood loss.  Essential hypertension Continue with atenolol.  Right hip pain She has Jex Strausbaugh history of arthritis.  Plain film on 7/26 with chronic bilateral total hip arthroplasty, stable since 2019. She is under the care of orthopedics for her right hip pain. She recently was injected with steroids on 8/4 (right greater trochanteric bursa injection). PT and OT evaluation.  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 Prior to worsening R hip pain 2-3 weeks ago, she was transferring well and doing better, but she's declined recently in setting of this pain.  Will pursue SNF level of care.        DVT prophylaxis: lovenox Code Status: DNR Family Communication: son at bedside Disposition:   Status is: Inpatient  Remains inpatient appropriate because:Inpatient level of care appropriate due to severity of illness   Dispo: The patient is from: ALF              Anticipated d/c is to: SNF  Anticipated d/c date is: 1 day              Patient currently is not medically stable to d/c.   Consultants:   none  Procedures:  none  Antimicrobials: Anti-infectives (From admission, onward)   Start     Dose/Rate Route Frequency Ordered Stop   07/27/20 1130  cephALEXin (KEFLEX) capsule 500 mg     Discontinue     500 mg Oral Every 6 hours 07/27/20 1033 08/01/20 1159      Subjective: C/o R hip pain  Objective: Vitals:   07/27/20 0402 07/27/20 1439 07/27/20 2053 07/28/20 0449  BP: (!) 151/83 (!) 144/70 (!) 140/58 (!) 146/84  Pulse: 62 63 74 60  Resp: 16 (!) 22 (!) 24 18    Temp: 98.2 F (36.8 C) 97.9 F (36.6 C) 98 F (36.7 C) 98 F (36.7 C)  TempSrc:  Oral Oral Oral  SpO2: 94% 95% 94% 94%  Weight:      Height:        Intake/Output Summary (Last 24 hours) at 07/28/2020 1041 Last data filed at 07/28/2020 0513 Gross per 24 hour  Intake 960 ml  Output 1800 ml  Net -840 ml   Filed Weights   07/26/20 1228  Weight: 65.8 kg    Examination:  General: No acute distress. Cardiovascular: Heart sounds show [name] regular rate, and rhythm Lungs: Clear to auscultation bilaterally Abdomen: Soft, nontender, nondistended  Neurological: Alert and oriented 3. Moves all extremities 4. Cranial nerves II through XII grossly intact. Skin: Warm and dry. No rashes or lesions. Extremities: R great toe with abrasion, mild erythema  Data Reviewed: I have personally reviewed following labs and imaging studies  CBC: Recent Labs  Lab 07/26/20 1243 07/27/20 0433 07/28/20 0436  WBC 7.3 7.0 7.4  NEUTROABS 5.7  --  5.1  HGB 11.7* 11.7* 12.2  HCT 35.3* 36.1 38.0  MCV 97.0 99.2 98.4  PLT 309 262 115    Basic Metabolic Panel: Recent Labs  Lab 07/26/20 1243 07/26/20 1953 07/27/20 0433 07/28/20 0436  NA 120* 126* 127* 131*  K 4.0 4.6 4.0 4.0  CL 85* 89* 93* 96*  CO2 26 29 25 26   GLUCOSE 93 93 98 94  BUN 14 13 14 15   CREATININE 0.69 0.81 0.67 0.79  CALCIUM 8.6* 9.2 8.6* 8.9  MG  --   --   --  1.9  PHOS  --   --   --  3.2    GFR: Estimated Creatinine Clearance: 37.5 mL/min (by C-G formula based on SCr of 0.79 mg/dL).  Liver Function Tests: Recent Labs  Lab 07/27/20 0433 07/28/20 0436  AST 34 27  ALT 26 24  ALKPHOS 89 83  BILITOT 0.5 0.7  PROT 6.8 6.7  ALBUMIN 3.1* 3.2*    CBG: No results for input(s): GLUCAP in the last 168 hours.   Recent Results (from the past 240 hour(s))  SARS Coronavirus 2 by RT PCR (hospital order, performed in [facility] hospital lab) Nasopharyngeal Nasopharyngeal Swab     Status: None   Collection Time: 07/26/20   2:36 PM   Specimen: Nasopharyngeal Swab  Result Value Ref Range Status   SARS Coronavirus 2 NEGATIVE NEGATIVE Final    Comment: (NOTE) SARS-CoV-2 target nucleic acids are NOT DETECTED.  The SARS-CoV-2 RNA is generally detectable in upper and lower respiratory specimens during the acute phase of infection. The lowest concentration of SARS-CoV-2 viral copies this assay can detect is 250 copies /  mL. Cedar Ditullio negative result does not preclude SARS-CoV-2 infection and should not be used as the sole basis for treatment or other patient management decisions.  Ian Castagna negative result may occur with improper specimen collection / handling, submission of specimen other than nasopharyngeal swab, presence of viral mutation(s) within the areas targeted by this assay, and inadequate number of viral copies (<250 copies / mL). Khayman Kirsch negative result must be combined with clinical observations, patient history, and epidemiological information.  Fact Sheet for Patients:   StrictlyIdeas.no  Fact Sheet for Healthcare Providers: BankingDealers.co.za  This test is not yet approved or  cleared by the [place] FDA and has been authorized for detection and/or diagnosis of SARS-CoV-2 by FDA under an Emergency Use Authorization (EUA).  This EUA will remain in effect (meaning this test can be used) for the duration of the COVID-19 declaration under Section 564(b)(1) of the Act, 21 U.S.C. section 360bbb-3(b)(1), unless the authorization is terminated or revoked sooner.  Performed at [facility], [place] [address], [place], [place] [address]          Radiology Studies: DG CHEST PORT 1 VIEW  Result Date: 07/27/2020 CLINICAL DATA:  Shortness of breath. EXAM: PORTABLE CHEST 1 VIEW COMPARISON:  02/25/2019 FINDINGS: The heart is within normal limits in size given the AP projection and portable technique. Mild tortuosity and calcification of the thoracic  aorta. Chronic or recurrent left pleural effusion and left lower lobe atelectasis. Minimal streaky right basilar atelectasis. No pulmonary edema or worrisome pulmonary lesions. The bony thorax is intact IMPRESSION: Chronic or recurrent left pleural effusion and left lower lobe atelectasis. Electronically Signed   By: Marijo Sanes M.D.   On: 07/27/2020 05:45   DG Toe Great Right  Result Date: 07/27/2020 CLINICAL DATA:  Rolled over toe EXAM: RIGHT GREAT TOE COMPARISON:  None. FINDINGS: Osteopenia. No acute fracture or dislocation. Joint spaces and alignment are maintained for age. No area of erosion or osseous destruction. No unexpected radiopaque foreign body. Soft tissues are unremarkable. IMPRESSION: No acute fracture or dislocation. Electronically Signed   By: Valentino Saxon MD   On: 07/27/2020 11:30        Scheduled Meds: . atenolol  25 mg Oral Daily  . cephALEXin  500 mg Oral Q6H  . diclofenac Sodium  2 g Topical QID  . enoxaparin (LOVENOX) injection  40 mg Subcutaneous Q24H  . gabapentin  100 mg Oral BID  . latanoprost  1 drop Both Eyes QHS  . pantoprazole  40 mg Oral Daily  . sodium chloride  1 g Oral BID WC   Continuous Infusions:   LOS: 2 days    Time spent: over 30 min    Fayrene Helper, MD [facility]   To contact the attending provider between 7A-7P or the covering provider during after hours 7P-7A, please log into the web site [email or web address] and access using universal [place] password for that web site. If you do not have the password, please call the hospital operator.  07/28/2020, 10:41 AM

## 2020-07-28 NOTE — Progress Notes (Signed)
Initial Nutrition Assessment  DOCUMENTATION CODES:   Not applicable  INTERVENTION:  42m Prosource Plus po BID, each supplement provides 100 kcal and 15 grams of protein  MVI daily  NUTRITION DIAGNOSIS:   Increased nutrient needs related to wound healing as evidenced by estimated needs.    GOAL:   Patient will meet greater than or equal to 90% of their needs    MONITOR:   PO intake, Supplement acceptance, Weight trends, Skin, Labs  REASON FOR ASSESSMENT:   Malnutrition Screening Tool    ASSESSMENT:   Pt from assisted living facility presenting with hyponatremia. PMH includes HTN, depression, glaucoma, chronic hyponatremia.  Pt states appetite is poor and that this is typical for her. PTA, she ate 3 balanced meals per day. Pt gets Boost Breeze at bedtime at her facility. She gets up every hour to urinate and around midnight she has peanut butter crackers for a snack. Pt states Boost Breeze helps keep her full until that snack. Pt does not want supplements while admitted because of her fluid restriction, pt prefers water. Pt willing to try prosource since it won't contribute much to her fluid restriction.   Pt takes one salt tablet per day at home.   Reviewed wt hx, pt noted to have gradual wt gain over the last year.   PO Intake: 25% x 1 recorded meal  Labs: Na 131 (L) Medications: Protonix, NaCl 1g BID  NUTRITION - FOCUSED PHYSICAL EXAM:    Most Recent Value  Orbital Region No depletion  Upper Arm Region Mild depletion  Thoracic and Lumbar Region No depletion  Buccal Region No depletion  Temple Region Mild depletion  Clavicle Bone Region No depletion  Clavicle and Acromion Bone Region No depletion  Scapular Bone Region Mild depletion  Dorsal Hand Mild depletion  Patellar Region No depletion  Anterior Thigh Region No depletion  Posterior Calf Region No depletion  Edema (RD Assessment) None  Hair Reviewed  Eyes Reviewed  Mouth Reviewed  Skin Reviewed   Nails Reviewed       Diet Order:   Diet Order            DIET DYS 3 Room service appropriate? Yes; Fluid consistency: Thin; Fluid restriction: 1200 mL Fluid  Diet effective now                 EDUCATION NEEDS:   No education needs have been identified at this time  Skin:  Skin Assessment: Skin Integrity Issues: Skin Integrity Issues:: Stage I, Stage II Stage I: R/L buttocks Stage II: anterior R toe  Last BM:  8/10  Height:   Ht Readings from Last 1 Encounters:  07/26/20 5' 4"  (1.626 m)    Weight:   Wt Readings from Last 10 Encounters:  07/26/20 65.8 kg  07/21/20 65.8 kg  07/18/20 65.8 kg  07/13/20 65.8 kg  05/10/20 65.1 kg  03/16/20 64.4 kg  01/05/20 63.5 kg  09/28/19 60.4 kg  07/22/19 59.4 kg  06/30/19 58.8 kg    BMI:  Body mass index is 24.9 kg/m.  Estimated Nutritional Needs:   Kcal:  1550-1750  Protein:  80-95 grams  Fluid:  12033mper MD    AmLarkin InaMS, RD, LDN RD pager number and weekend/on-call pager number located in [place]

## 2020-07-28 NOTE — Progress Notes (Addendum)
Occupational Therapy Evaluation  Patient with functional deficits listed below impacting safety and independence with self care. At baseline patient was mod I with functional transfers, dressing with reacher(except ted hoes) and toileting, had assist for bathing. Patient does not ambulate only pivots to power chair, lift recliner, ect Since onset of pain ~2 weeks ago patient had to hire 8hr/day caregiver at ALF to provide assist with transfers and self care. Currently patient requires mod A for bed mobility and mod A for stand pivot from EOB to recliner with 4-5/10 R hip pain. Patient wanting to go back to ALF with caregiver support, OT explain patient will need 24/7 supervision and additional therapy to regain independence/ reduce caregiver burden therefore recommend SNF rehab at this time.    07/28/20 1400  OT Visit Information  Last OT Received On 07/28/20  Assistance Needed +1  History of Present Illness 86 yo female admitted with hyponatremia, R hip and R foot pain. Hx of DM, CHF, breast ca, OA, LBP, lymphedema, osteopenia, osteporosis, PSVT  Precautions  Precautions Fall  Restrictions  Weight Bearing Restrictions No  Home Living  Family/patient expects to be discharged to: Assisted living  Home Equipment Wheelchair - power (Risk analyst chair)  Prior Function  Level of Independence Needs assistance  Gait / Transfers Assistance Needed transferring recliner/bed to WC/toilet without assistance. uses armrests of chairs  ADL's / [place] patient reports was able to toilet and dress herself (except ted hoes) up until 2 weeks ago when hip pain started, had assist with bathing at baseline. Recently hired Actuary for 8hrs/day to assist with Interior and spatial designer HOH  Pain Assessment  Pain Assessment 0-10  Pain Score 4  Pain Location R hip  Pain Descriptors / Indicators Tender;Discomfort;Sore  Pain Intervention(s) Monitored during  session;Premedicated before session;Heat applied  Cognition  Arousal/Alertness Awake/alert  Behavior During Therapy WFL for tasks assessed/performed  Overall Cognitive Status Within Functional Limits for tasks assessed  Upper Extremity Assessment  Upper Extremity Assessment Generalized weakness  Lower Extremity Assessment  Lower Extremity Assessment Defer to PT evaluation  Cervical / Trunk Assessment  Cervical / Trunk Assessment Kyphotic  ADL  Overall ADL's  Needs assistance/impaired  Eating/Feeding Set up;Sitting  Eating/Feeding Details (indicate cue type and reason) drink from cup  Grooming Set up;Sitting  Upper Body Bathing Minimal assistance;Sitting  Upper Body Bathing Details (indicate cue type and reason) due to limited activity tolerance and pain with mobility  Lower Body Bathing Maximal assistance;Sitting/lateral leans;Bed level  Upper Body Dressing  Minimal assistance;Sitting  Lower Body Dressing Total assistance;Sitting/lateral leans;Bed level  Lower Body Dressing Details (indicate cue type and reason) total A to don R sock, decreased strength, activity tolerance and increased pain with mobility  Toilet Transfer Moderate assistance;Stand-pivot;BSC  Toilet Transfer Details (indicate cue type and reason) to recliner, cues for hand placement  Toileting- Clothing Manipulation and Hygiene Total assistance  Functional mobility during ADLs Moderate assistance (pivot only)  General ADL Comments patient does not ambulate at baseline just transfers however was mod I with this until ~2 weeks ago, patient now requiring increased assist with transfers/ADLs due to pain, weakness, decreased act tolerance  Vision- History  Baseline Vision/History Glaucoma;Wears glasses  Wears Glasses At all times  Bed Mobility  Overal bed mobility Needs Assistance  Bed Mobility Rolling;Sidelying to Sit  Rolling Mod assist  Sidelying to sit Min assist;HOB elevated  General bed mobility comments cues for  sequencing log roll with mod A to manage LEs to  EOB, min A to elevate trunk  Transfers  Overall transfer level Needs assistance  Equipment used  (gait belt)  Transfers Stand Pivot Transfers  Stand pivot transfers Mod assist  General transfer comment cues for hand placement, mod A to perform stand pivot from EOB to chair   Balance  Overall balance assessment Needs assistance  Sitting-balance support Bilateral upper extremity supported  Sitting balance-Leahy Scale Poor  Standing balance support Bilateral upper extremity supported  Standing balance-Leahy Scale Poor  OT - End of Session  Equipment Utilized During Treatment Gait belt  Activity Tolerance Patient tolerated treatment well  Patient left in chair;with call bell/phone within reach;with chair alarm set;with family/visitor present  Nurse Communication Mobility status  OT Assessment  OT Recommendation/Assessment Patient needs continued OT Services  OT Visit Diagnosis Other abnormalities of gait and mobility (R26.89);Pain;Muscle weakness (generalized) (M62.81)  Pain - Right/Left Right  Pain - part of body Hip  OT Problem List Decreased strength;Decreased activity tolerance;Impaired balance (sitting and/or standing);Decreased safety awareness;Pain  OT Plan  OT Frequency (ACUTE ONLY) Min 2X/week  OT Treatment/Interventions (ACUTE ONLY) Self-care/ADL training;Therapeutic exercise;DME and/or AE instruction;Therapeutic activities;Patient/family education;Balance training  AM-PAC OT "6 Clicks" Daily Activity Outcome Measure (Version 2)  Help from another person eating meals? 3  Help from another person taking care of personal grooming? 3  Help from another person toileting, which includes using toliet, bedpan, or urinal? 2  Help from another person bathing (including washing, rinsing, drying)? 2  Help from another person to put on and taking off regular upper body clothing? 3  Help from another person to put on and taking off regular  lower body clothing? 1  6 Click Score 14  OT Recommendation  Follow Up Recommendations SNF;Supervision/Assistance - 24 hour  OT Equipment Other (comment) (TBD)  Individuals Consulted  Consulted and Agree with Results and Recommendations Patient  Acute Rehab OT Goals  Patient Stated Goal return to ALF  OT Goal Formulation With patient  Time For Goal Achievement 08/11/20  Potential to Achieve Goals Good  OT Time Calculation  OT Start Time (ACUTE ONLY) 1027  OT Stop Time (ACUTE ONLY) 1105  OT Time Calculation (min) 38 min  OT General Charges  $OT Visit 1 Visit  OT Evaluation  $OT Eval Moderate Complexity 1 Mod  OT Treatments  $Self Care/Home Management  23-37 mins  Written Expression  Dominant Hand Right   Delbert Phenix OT OT pager: [phone]

## 2020-07-28 NOTE — NC FL2 (Addendum)
[place] LEVEL OF CARE SCREENING TOOL     IDENTIFICATION  Patient Name: [name] Birthdate: Apr 08, 1921 Sex: female Admission Date (Current Location): 07/26/2020  [facility] and [place] Number:  Herbalist and Address:  [facility],  [place] [place], [place]      Provider Number: [id number]  Attending Physician Name and Address:  Elodia Florence., *  Relative Name and Phone Number:  Martino,Brad Legal Guardian [phone]  [phone] or Eudell, Julian [phone]  [phone] or Resa Miner [phone]  [phone]    Current Level of Care: Hospital Recommended Level of Care: ALF Prior Approval Number:    Date Approved/Denied:   PASRR Number:   Discharge Plan: ALF    Current Diagnoses: Patient Active Problem List   Diagnosis Date Noted  . Right hip pain 07/13/2020  . T2DM (type 2 diabetes mellitus) ([place]) 03/16/2020  . Postmenopausal symptoms 03/16/2020  . CKD (chronic kidney disease) stage 3, GFR 30-59 ml/min 07/29/2019  . Pain in gums 07/22/2019  . Amaurosis fugax of right eye 01/15/2019  . Glaucoma 01/14/2019  . Macular degeneration 01/14/2019  . Chronic diastolic CHF (congestive heart failure) ([place]) 08/13/2018  . Pulmonary hypertension, primary ([place]) 07/31/2018  . Neuropathy 06/20/2018  . Paroxysmal atrial fibrillation ([place]) 06/20/2018  . Iron deficiency 01/21/2018  . Fall 01/09/2018  . Advanced care planning/counseling discussion 11/20/2017  . Melena   . Diverticulosis of colon with hemorrhage   . Debility 04/03/2017  . Hyponatremia 07/09/2015  . Hearing loss of both ears 04/08/2014  . HTN (hypertension) 03/27/2014  . Slow transit constipation 03/27/2014  . Skin lesion of face 03/27/2014  . Rotator cuff syndrome of right shoulder 09/30/2012  . Breast cancer ([place])   . Lung cancer, lower lobe ([place]) 10/25/2011  . PSVT (paroxysmal supraventricular tachycardia) ([place]) 03/29/2011  . INTERDIGITAL  NEUROMA 03/28/2010  . IMPINGEMENT SYNDROME 03/28/2010  . THYROID NODULE, RIGHT 09/06/2008  . LEG EDEMA, BILATERAL 12/22/2007  . Depression with anxiety 07/31/2007  . Other specified disorders of adrenal gland ([place]) 05/14/2007  . Hyperlipidemia 01/28/2007  . Blood loss anemia 01/28/2007  . Allergic rhinitis 01/28/2007  . GERD (gastroesophageal reflux disease) 01/28/2007  . OVERACTIVE BLADDER 01/28/2007  . Osteoarthritis 01/28/2007  . Pain in lower back 01/28/2007  . Osteoporosis 01/28/2007    Orientation RESPIRATION BLADDER Height & Weight     Time, Self, Situation, Place  Normal   Weight: 145 lb 1 oz (65.8 kg) Height:  5' 4"  (162.6 cm)  BEHAVIORAL SYMPTOMS/MOOD NEUROLOGICAL BOWEL NUTRITION STATUS        Diet (Dysphagia 3 diet)  AMBULATORY STATUS COMMUNICATION OF NEEDS Skin   Limited Assist   PU Stage and Appropriate Care PU Stage 1 Dressing:  (PRN dressing change) PU Stage 2 Dressing:  (Daily)                   Personal Care Assistance Level of Assistance  Bathing, Dressing, Feeding Bathing Assistance: Limited assistance Feeding assistance: Independent Dressing Assistance: Limited assistance     Functional Limitations Info  Sight, Hearing, Speech Sight Info: Adequate Hearing Info: Adequate Speech Info: Adequate    SPECIAL CARE FACTORS FREQUENCY  PT (By licensed PT), OT (By licensed OT)     PT Frequency: Minimum 5x a week OT Frequency: Minimum 5x a week            Contractures Contractures Info: Not present    Additional Factors Info  Code Status, Allergies Code  Status Info: DNR Allergies Info: Augmentin, Ibuprofen Naproxen Nsaids Statins, Surgilube, Tape           Current Medications (07/28/2020):  This is the current hospital active medication list Current Facility-Administered Medications  Medication Dose Route Frequency Provider Last Rate Last Admin  . acetaminophen (TYLENOL) tablet 650 mg  650 mg Oral Q6H PRN Bonnielee Haff, MD   650 mg at  07/28/20 3149   Or  . acetaminophen (TYLENOL) suppository 650 mg  650 mg Rectal Q6H PRN Bonnielee Haff, MD      . atenolol (TENORMIN) tablet 25 mg  25 mg Oral Daily Bonnielee Haff, MD   25 mg at 07/28/20 7026  . cephALEXin (KEFLEX) capsule 500 mg  500 mg Oral Q6H Elodia Florence., MD   500 mg at 07/28/20 3785  . diclofenac Sodium (VOLTAREN) 1 % topical gel 2 g  2 g Topical QID Elodia Florence., MD   2 g at 07/28/20 [phone]  . enoxaparin (LOVENOX) injection 40 mg  40 mg Subcutaneous Q24H Bonnielee Haff, MD   40 mg at 07/27/20 2059  . gabapentin (NEURONTIN) capsule 100 mg  100 mg Oral BID Bonnielee Haff, MD   100 mg at 07/28/20 2774  . latanoprost (XALATAN) 0.005 % ophthalmic solution 1 drop  1 drop Both Eyes QHS Bonnielee Haff, MD   1 drop at 07/27/20 2118  . ondansetron (ZOFRAN) tablet 4 mg  4 mg Oral Q6H PRN Bonnielee Haff, MD       Or  . ondansetron [facility]) injection 4 mg  4 mg Intravenous Q6H PRN Bonnielee Haff, MD      . oxyCODONE (Oxy IR/ROXICODONE) immediate release tablet 2.5 mg  2.5 mg Oral Q4H PRN Elodia Florence., MD   2.5 mg at 07/28/20 0435   Or  . oxyCODONE (Oxy IR/ROXICODONE) immediate release tablet 5 mg  5 mg Oral Q6H PRN Elodia Florence., MD      . pantoprazole (PROTONIX) EC tablet 40 mg  40 mg Oral Daily Bonnielee Haff, MD   40 mg at 07/28/20 0923  . sodium chloride tablet 1 g  1 g Oral BID WC Bonnielee Haff, MD   1 g at 07/28/20 1287     Discharge Medications: Please see discharge summary for a list of discharge medications.  Relevant Imaging Results:  Relevant Lab Results:   Additional Information SSN [id number]  Ross Ludwig, LCSW

## 2020-07-28 NOTE — TOC Progression Note (Addendum)
Transition of Care [facility]) - Progression Note    Patient Details  Name: [name] MRN: [id number] Date of Birth: 1921-06-16  Transition of [facility]) CM/SW Contact  Ross Ludwig, [place] Phone Number:  07/28/2020, 4:24 PM  Clinical Narrative:     Patient is a 86 year old female who is from [facility].  Patient family was requesting patient to go to SNF, CSW contacted [facility], and they would like to try home health first because per facility, insurance will probably not approve her for SNF placement.  Per [facility] they can accept patient back tomorrow if she is medically ready for discharge.  CSW spoke to patient's son who was at bedside and informed him what [facility] said.  Per patient's son, they will transport her once she is ready for discharge home.  CSW to continue to follow patient's progress throughout discharge planning.   Expected Discharge Plan: Assisted Living Barriers to Discharge: Continued Medical Work up  Expected Discharge Plan and Services Expected Discharge Plan: Assisted Living     Post Acute Care Choice: [place] arrangements for the past 2 months: [place]                   DME Agency: NA       HH Arranged: PT, OT [place] Agency: Other - See comment ([facility]) Date HH Agency Contacted: 07/28/20 Time [place]: [phone] Representative spoke with at [place]: Katie at [place] ([place]) Interventions    Readmission Risk Interventions No flowsheet data found.

## 2020-07-29 DIAGNOSIS — R079 Chest pain, unspecified: Secondary | ICD-10-CM | POA: Diagnosis not present

## 2020-07-29 LAB — CBC WITH DIFFERENTIAL/PLATELET
Abs Immature Granulocytes: 0.03 10*3/uL (ref 0.00–0.07)
Basophils Absolute: 0 10*3/uL (ref 0.0–0.1)
Basophils Relative: 1 %
Eosinophils Absolute: 0.1 10*3/uL (ref 0.0–0.5)
Eosinophils Relative: 1 %
HCT: 34.9 % — ABNORMAL LOW (ref 36.0–46.0)
Hemoglobin: 11.5 g/dL — ABNORMAL LOW (ref 12.0–15.0)
Immature Granulocytes: 0 %
Lymphocytes Relative: 13 %
Lymphs Abs: 1 10*3/uL (ref 0.7–4.0)
MCH: 31.9 pg (ref 26.0–34.0)
MCHC: 33 g/dL (ref 30.0–36.0)
MCV: 96.7 fL (ref 80.0–100.0)
Monocytes Absolute: 1.1 10*3/uL — ABNORMAL HIGH (ref 0.1–1.0)
Monocytes Relative: 14 %
Neutro Abs: 5.8 10*3/uL (ref 1.7–7.7)
Neutrophils Relative %: 71 %
Platelets: 284 10*3/uL (ref 150–400)
RBC: 3.61 MIL/uL — ABNORMAL LOW (ref 3.87–5.11)
RDW: 14.8 % (ref 11.5–15.5)
WBC: 8 10*3/uL (ref 4.0–10.5)
nRBC: 0 % (ref 0.0–0.2)

## 2020-07-29 LAB — COMPREHENSIVE METABOLIC PANEL
ALT: 22 U/L (ref 0–44)
AST: 24 U/L (ref 15–41)
Albumin: 3.1 g/dL — ABNORMAL LOW (ref 3.5–5.0)
Alkaline Phosphatase: 77 U/L (ref 38–126)
Anion gap: 8 (ref 5–15)
BUN: 17 mg/dL (ref 8–23)
CO2: 26 mmol/L (ref 22–32)
Calcium: 9.1 mg/dL (ref 8.9–10.3)
Chloride: 96 mmol/L — ABNORMAL LOW (ref 98–111)
Creatinine, Ser: 0.78 mg/dL (ref 0.44–1.00)
GFR calc Af Amer: >60 mL/min (ref >60)
GFR calc non Af Amer: >60 mL/min (ref >60)
Glucose, Bld: 94 mg/dL (ref 70–99)
Potassium: 3.9 mmol/L (ref 3.5–5.1)
Sodium: 130 mmol/L — ABNORMAL LOW (ref 135–145)
Total Bilirubin: 0.8 mg/dL (ref 0.3–1.2)
Total Protein: 6.8 g/dL (ref 6.5–8.1)

## 2020-07-29 LAB — MAGNESIUM: Magnesium: 1.8 mg/dL (ref 1.7–2.4)

## 2020-07-29 LAB — CORTISOL: Cortisol, Plasma: 5.4 ug/dL

## 2020-07-29 LAB — PHOSPHORUS: Phosphorus: 3.5 mg/dL (ref 2.5–4.6)

## 2020-07-29 MED ORDER — CEPHALEXIN 500 MG PO CAPS: 500.0000 mg | ORAL_CAPSULE | Freq: Four times a day (QID) | ORAL | 0 refills | Status: AC

## 2020-07-29 MED ORDER — OXYCODONE HCL 5 MG PO TABS: 2.5000 mg | ORAL_TABLET | Freq: Four times a day (QID) | ORAL | 0 refills | Status: AC | PRN

## 2020-07-29 MED ORDER — DICLOFENAC SODIUM 1 % EX GEL: 2.0000 g | Freq: Four times a day (QID) | CUTANEOUS | 0 refills | Status: AC | PRN

## 2020-07-29 NOTE — Discharge Summary (Signed)
Physician Discharge Summary  [name] KCL:[id number] DOB: November 29, 1921 DOA: 07/26/2020  PCP: [name] Dad, MD  Admit date: 07/26/2020 Discharge date: 07/29/2020  Time spent: 40 minutes  Recommendations for Outpatient Follow-up:  1. Follow outpatient CBC/CMP 2. Continue salt tabs and 1.2 L fluid restriction 3. Follow low cortisol outpatient (needs repeat AM cortisol and/or ACTH stim test)  4. Follow R hip pain with orthopedics outpatient 5. Complete abx for R toe 6. Follow L pleural effusion outpatient   Discharge Diagnoses:  Principal Problem:   Hyponatremia   Discharge Condition: stable  Diet recommendation: heart healthy, 1.2 L fluid restriction  Filed Weights   07/26/20 1228  Weight: 65.8 kg    History of present illness:  [name] 86 y.o.femalewho lives in an assisted living facility and who has [name] past medical history of essential hypertension, history of depression, history of glaucoma who has had low sodium levels for many months. She has been stable on one salt tablet per day. She also has [name] history of arthritis and has been dealing with pain in her right hip for [name] few weeks. She was started on tramadol 2 weeks ago and was also given [name] steroid injection about [name] week ago. Over the last week or so her sodium level has been trending down. Today it was noted to be 119 and so she was sent over to the emergency department. Patient admits to consuming [name] lot of water over the last 1 week or so. This is because she is feeling thirsty. She feels dry in her mouth. Has not really had any vomiting. Has had [name] few loose stools. But no more than 1 to 2/day.  In the emergency department she was found to have [name] sodium of 120. Patient was started on IV fluids. Hospital medicine was called for further management.  She was admitted for hyponatremia which improved with IVF, fluid restriction, and salt tabs.  She was started on keflex for cellulitis of her R great toe.      See  below for additional details  Hospital Course:  Hyponatremia Patient does not appear to be hypervolemic. Suspect hypovolemia with possible contribution from increased free water intake as well as possible SIADH?  Tramadol could be contributing as well - will d/c this. Improved with salt tabs and fluid restriction Will plan to resume cymbalta at discharge as this has been chronic medication Continue 1.2 L free water restriction as well as salt tabs from home Hold tramadol.  Normal TSH. AM cortisol is low, needs follow up outpatient (repeat cortisol or ACTH stim test) Urine sodium 38, urine osm 121  Right Toe Pain  Cellulitis: pt ran over R great toe with walker with area of erythema/abrasion - negative plain films Will treat empirically for cellulitis Continue pain med regimen  Improving pain  Left Pleural Effusion: seems asymptomatic from this, would follow outpatient with PCP for further follow up/workup as needed - discussed with pt/fam  Glaucoma Continue with her eyedrops.  Normocytic anemia Hemoglobin is close to baseline. No evidence of overt blood loss.  Essential hypertension Continue with atenolol.  Right hip pain She has [name] history of arthritis.  Plain film on 7/26 with chronic bilateral total hip arthroplasty, stable since 2019. She is under the care of orthopedics for her right hip pain. She recently was injected with steroids on 8/4 (right greater trochanteric bursa injection). PT and OT evaluation.  Per orthopedics, suspected R hip greater trochanter syndrome with plan for  PT at residence if no improvement with steroids.  Plan for d/c back to ALF with therapy, they'll follow to see if she needs higher level of care Continue follow up with orthopedics  Procedures: none  Consultations:  none  Discharge Exam: Vitals:   07/29/20 0541 07/29/20 1006  BP: (!) 161/69 139/73  Pulse: 69 64  Resp: 20   Temp: 98 F (36.7 C)   SpO2: 95%    No new  complaints Persistent R hip pain, likes purewick bc she doesn't have to get up Discussed with son  General: No acute distress. Cardiovascular: Heart sounds show [name] regular rate, and rhythm.  Lungs: Clear to auscultation bilaterally Abdomen: Soft, nontender, nondistended  Neurological: Alert and oriented 3. Moves all extremities 4 . Cranial nerves II through XII grossly intact. Skin: Warm and dry. No rashes or lesions. Extremities: R first toe with abrasion   Discharge Instructions   Discharge Instructions    Call MD for:  difficulty breathing, headache or visual disturbances   Complete by: As directed    Call MD for:  extreme fatigue   Complete by: As directed    Call MD for:  hives   Complete by: As directed    Call MD for:  persistant dizziness or light-headedness   Complete by: As directed    Call MD for:  persistant nausea and vomiting   Complete by: As directed    Call MD for:  redness, tenderness, or signs of infection (pain, swelling, redness, odor or green/yellow discharge around incision site)   Complete by: As directed    Call MD for:  severe uncontrolled pain   Complete by: As directed    Call MD for:  temperature >100.4   Complete by: As directed    Diet - low sodium heart healthy   Complete by: As directed    Discharge instructions   Complete by: As directed    You were seen for hyponatremia (low sodium levels).  This improved with IV fluids, fluid restriction, and salt tablets.  Please continue your 1.2 L fluid restriction and your salt tablets at home.  We started you on antibiotics for cellulitis on your toe.    I've stopped your tramadol, this can sometimes contribute to low sodium.  We'll send you home with oxycodone instead.  You can also use voltaren for your hip pain.  Please continue to follow up with orthopedics to evaluate your hip pain further.   Your cortisol (stress hormone level) was low and needs to be followed up with repeat testing.   Please follow up with the doctor at the ALF to arrange this.  Return for new, recurrent, or worsening symptoms.  Please ask your PCP to request records from this hospitalization so they know what was done and what the next steps will be.   Discharge wound care:   Complete by: As directed    Turn patient frequently, monitor stage 1 decubitus on sacrum   Increase activity slowly   Complete by: As directed      Allergies as of 07/29/2020      Reactions   Augmentin [amoxicillin-pot Clavulanate] Other (See Comments)   unknown   Ibuprofen Hives   Naproxen Other (See Comments)   Unknown Tears stomach up   Nsaids    Tears stomach up   Statins Other (See Comments)   Feels like knives in stomach   Surgilube [gyne-moistrin]    Rectal itching, burning   Tape Rash  Medication List    STOP taking these medications   traMADol 50 MG tablet Commonly known as: ULTRAM     TAKE these medications   acetaminophen 500 MG tablet Commonly known as: TYLENOL Take 500 mg by mouth in the morning, at noon, and at bedtime.   atenolol 25 MG tablet Commonly known as: TENORMIN Take 1 tablet (25 mg total) by mouth daily.   B Complex-B12 Tabs Take 1 tablet by mouth daily.   cephALEXin 500 MG capsule Commonly known as: KEFLEX Take 1 capsule (500 mg total) by mouth every 6 (six) hours for 3 days.   diclofenac Sodium 1 % Gel Commonly known as: Voltaren Apply 2 g topically 4 (four) times daily as needed.   DULoxetine 30 MG capsule Commonly known as: CYMBALTA Take 1 capsule (30 mg total) by mouth daily.   ferrous sulfate 220 (44 Fe) MG/5ML solution Take 220 mg by mouth daily. 5 mL by mouth daily with meal Once Zared Knoth Day   folic acid 038 MCG tablet Commonly known as: FOLVITE Take 1 tablet (400 mcg total) by mouth daily.   gabapentin 100 MG capsule Commonly known as: NEURONTIN Take 1 capsule (100 mg total) by mouth 2 (two) times daily.   Imvexxy Maintenance Pack 4 MCG Inst Generic drug:  Estradiol Place 4 mcg vaginally once Sherine Cortese week. On Thursday   latanoprost 0.005 % ophthalmic solution Commonly known as: XALATAN Place 1 drop into both eyes at bedtime.   magnesium oxide 400 (241.3 Mg) MG tablet Commonly known as: MAG-OX Take 1 tablet (400 mg total) by mouth 2 (two) times daily.   mupirocin ointment 2 % Commonly known as: BACTROBAN Apply 1 application topically daily as needed (nasal sores).   omeprazole 20 MG capsule Commonly known as: PRILOSEC Take 1 capsule (20 mg total) by mouth daily.   oxyCODONE 5 MG immediate release tablet Commonly known as: Oxy IR/ROXICODONE Take 0.5 tablets (2.5 mg total) by mouth every 6 (six) hours as needed for up to 3 days for moderate pain.   polyethylene glycol 17 g packet Commonly known as: MIRALAX / GLYCOLAX Take 17 g by mouth daily.   PreserVision AREDS 2 Caps Take 1 capsule by mouth 2 (two) times daily.   Senokot S 8.6-50 MG tablet Generic drug: senna-docusate Take 1 tablet by mouth daily.   simethicone 80 MG chewable tablet Commonly known as: MYLICON Chew 80 mg by mouth 4 (four) times daily as needed for flatulence.   sodium chloride 1 g tablet Take 1 g by mouth 2 (two) times daily with Marketa Midkiff meal.            Discharge Care Instructions  (From admission, onward)         Start     Ordered   07/29/20 0000  Discharge wound care:       Comments: Turn patient frequently, monitor stage 1 decubitus on sacrum   07/29/20 1032         Allergies  Allergen Reactions  . Augmentin [Amoxicillin-Pot Clavulanate] Other (See Comments)    unknown  . Ibuprofen Hives  . Naproxen Other (See Comments)    Unknown Tears stomach up  . Nsaids     Tears stomach up  . Statins Other (See Comments)    Feels like knives in stomach  . Surgilube [Gyne-Moistrin]     Rectal itching, burning  . Tape Rash      The results of significant diagnostics from this hospitalization (including imaging, microbiology, ancillary and  laboratory) are listed below for reference.    Significant Diagnostic Studies: US Guided Needle Placement  Result Date: 07/25/2020 Limited diagnostic ultrasound of the anterior hip reveals no obvious joint effusion.  I believe the iliopsoas tendon is intact.  DG CHEST PORT 1 VIEW  Result Date: 07/27/2020 CLINICAL DATA:  Shortness of breath. EXAM: PORTABLE CHEST 1 VIEW COMPARISON:  02/25/2019 FINDINGS: The heart is within normal limits in size given the AP projection and portable technique. Mild tortuosity and calcification of the thoracic aorta. Chronic or recurrent left pleural effusion and left lower lobe atelectasis. Minimal streaky right basilar atelectasis. No pulmonary edema or worrisome pulmonary lesions. The bony thorax is intact IMPRESSION: Chronic or recurrent left pleural effusion and left lower lobe atelectasis. Electronically Signed   By: Marijo Sanes M.D.   On: 07/27/2020 05:45   DG Toe Great Right  Result Date: 07/27/2020 CLINICAL DATA:  Rolled over toe EXAM: RIGHT GREAT TOE COMPARISON:  None. FINDINGS: Osteopenia. No acute fracture or dislocation. Joint spaces and alignment are maintained for age. No area of erosion or osseous destruction. No unexpected radiopaque foreign body. Soft tissues are unremarkable. IMPRESSION: No acute fracture or dislocation. Electronically Signed   By: Valentino Saxon MD   On: 07/27/2020 11:30   DG Hips Bilat W or Wo Pelvis 3-4 Views  Result Date: 07/11/2020 CLINICAL DATA:  86 year old female with right rib pain for 2 days. No known injury. EXAM: DG HIP (WITH OR WITHOUT PELVIS) 3-4V BILAT COMPARISON:  Hip series 01/16/2018. FINDINGS: Chronic bilateral total hip arthroplasty. On the right hardware appears stable since 2019 and intact. Likewise on the left hardware appears stable and intact. Underlying osteopenia. Stable appearance of the pelvis, chronic left pubic rami deformities. Chronic SI joint degeneration. Partially visible chronic lumbar spine  degeneration. Negative lower abdominal and pelvic visceral contours. IMPRESSION: Chronic bilateral total hip arthroplasty appears stable since 2019. No acute osseous abnormality identified. Electronically Signed   By: Genevie Ann M.D.   On: 07/11/2020 18:56    Microbiology: Recent Results (from the past 240 hour(s))  SARS Coronavirus 2 by RT PCR (hospital order, performed in [facility] hospital lab) Nasopharyngeal Nasopharyngeal Swab     Status: None   Collection Time: 07/26/20  2:36 PM   Specimen: Nasopharyngeal Swab  Result Value Ref Range Status   SARS Coronavirus 2 NEGATIVE NEGATIVE Final    Comment: (NOTE) SARS-CoV-2 target nucleic acids are NOT DETECTED.  The SARS-CoV-2 RNA is generally detectable in upper and lower respiratory specimens during the acute phase of infection. The lowest concentration of SARS-CoV-2 viral copies this assay can detect is 250 copies / mL. Cicilia Clinger negative result does not preclude SARS-CoV-2 infection and should not be used as the sole basis for treatment or other patient management decisions.  Cuca Benassi negative result may occur with improper specimen collection / handling, submission of specimen other than nasopharyngeal swab, presence of viral mutation(s) within the areas targeted by this assay, and inadequate number of viral copies (<250 copies / mL). Takoda Siedlecki negative result must be combined with clinical observations, patient history, and epidemiological information.  Fact Sheet for Patients:   StrictlyIdeas.no  Fact Sheet for Healthcare Providers: BankingDealers.co.za  This test is not yet approved or  cleared by the [place] FDA and has been authorized for detection and/or diagnosis of SARS-CoV-2 by FDA under an Emergency Use Authorization (EUA).  This EUA will remain in effect (meaning this test can be used) for the duration of the COVID-19 declaration under  Section 564(b)(1) of the Act, 21 U.S.C. section  360bbb-3(b)(1), unless the authorization is terminated or revoked sooner.  Performed at [facility], [place] [address] [place], [place] [address]      Labs: Basic Metabolic Panel: Recent Labs  Lab 07/26/20 1243 07/26/20 1953 07/27/20 0433 07/28/20 0436 07/29/20 0422  NA 120* 126* 127* 131* 130*  K 4.0 4.6 4.0 4.0 3.9  CL 85* 89* 93* 96* 96*  CO2 26 29 25 26 26   GLUCOSE 93 93 98 94 94  BUN 14 13 14 15 17   CREATININE 0.69 0.81 0.67 0.79 0.78  CALCIUM 8.6* 9.2 8.6* 8.9 9.1  MG  --   --   --  1.9 1.8  PHOS  --   --   --  3.2 3.5   Liver Function Tests: Recent Labs  Lab 07/27/20 0433 07/28/20 0436 07/29/20 0422  AST 34 27 24  ALT 26 24 22   ALKPHOS 89 83 77  BILITOT 0.5 0.7 0.8  PROT 6.8 6.7 6.8  ALBUMIN 3.1* 3.2* 3.1*   No results for input(s): LIPASE, AMYLASE in the last 168 hours. No results for input(s): AMMONIA in the last 168 hours. CBC: Recent Labs  Lab 07/26/20 1243 07/27/20 0433 07/28/20 0436 07/29/20 0422  WBC 7.3 7.0 7.4 8.0  NEUTROABS 5.7  --  5.1 5.8  HGB 11.7* 11.7* 12.2 11.5*  HCT 35.3* 36.1 38.0 34.9*  MCV 97.0 99.2 98.4 96.7  PLT 309 262 287 284   Cardiac Enzymes: No results for input(s): CKTOTAL, CKMB, CKMBINDEX, TROPONINI in the last 168 hours. BNP: BNP (last 3 results) No results for input(s): BNP in the last 8760 hours.  ProBNP (last 3 results) No results for input(s): PROBNP in the last 8760 hours.  CBG: No results for input(s): GLUCAP in the last 168 hours.     Signed:  Fayrene Helper MD.  [facility] 07/29/2020, 10:35 AM

## 2020-07-29 NOTE — TOC Transition Note (Signed)
Transition of Care [facility]) - CM/SW Discharge Note   Patient Details  Name: [name] MRN: [id number] Date of Birth: [birth year]  [facility]) CM/SW Contact:  Ross Ludwig, LCSW Phone Number: 07/29/2020, 3:01 PM   Clinical Narrative:     CSW spoke [facility] and they said they can accept patient today. CSW spoke to patient and her two sons, to inform them that patient will be discharging today to ALF.  Patient's son is requesting EMS transport back home, CSW to make arrangements for patient to return to her ALF.  CSW was informed that [facility] will make arrangements to have in home care and home health services.    Final next level of care: Assisted Living [facility]) Barriers to Discharge: Barriers Resolved   Patient Goals and CMS Choice Patient states their goals for this hospitalization and ongoing recovery are:: To return back to ALF CMS Medicare.gov Compare Post Acute Care list provided to:: Patient Represenative (must comment) Choice offered to / list presented to : Adult Children, Patient  Discharge Placement  Patient to discharge back home with home health services and care support in home.                  Name of family member notified: Sons Brad and Harris Patient and family notified of of transfer: 07/29/20  Discharge Plan and Services     Post Acute Care Choice: Home Health            DME Agency: NA       HH Arranged: PT, OT, RN, Nurse's Aide, Social Work [employer] Agency: Other - See comment ([place] [facility] agency) Date [place]: 07/28/20 Time [place]: 1459 Representative spoke with at [place]: [place] ([place]) Interventions     Readmission Risk Interventions No flowsheet data found.

## 2020-07-29 NOTE — Care Management Important Message (Signed)
Important Message  Patient Details IM Letter given to the Patient Name: [name] MRN: [id number] Date of Birth: 06-24-1921   Medicare Important Message Given:  Yes     Kerin Salen 07/29/2020, 10:32 AM

## 2020-07-29 NOTE — NC FL2 (Signed)
[place] LEVEL OF CARE SCREENING TOOL     IDENTIFICATION  Patient Name: [name] Birthdate: [birth year] Sex: female Admission Date (Current Location): 07/26/2020  [facility] [place] Number:  Herbalist and Address:  [facility],  [place], [place]      Provider Number: [id number]  Attending Physician Name and Address:  Elodia Florence., *  Relative Name and Phone Number:  Betzold,Brad Legal Guardian [phone]  [phone] or Ivannia, Willhelm [phone]  [phone] or Resa Miner [phone]  [phone]    Current Level of Care: Hospital Recommended Level of Care: [place] Prior Approval Number:    Date Approved/Denied:   PASRR Number: [id number]  Discharge Plan: Domiciliary (Rest home) [facility])    Current Diagnoses: Patient Active Problem List   Diagnosis Date Noted  . Right hip pain 07/13/2020  . T2DM (type 2 diabetes mellitus) ([place]) 03/16/2020  . Postmenopausal symptoms 03/16/2020  . CKD (chronic kidney disease) stage 3, GFR 30-59 ml/min 07/29/2019  . Pain in gums 07/22/2019  . Amaurosis fugax of right eye 01/15/2019  . Glaucoma 01/14/2019  . Macular degeneration 01/14/2019  . Chronic diastolic CHF (congestive heart failure) ([place]) 08/13/2018  . Pulmonary hypertension, primary ([place]) 07/31/2018  . Neuropathy 06/20/2018  . Paroxysmal atrial fibrillation ([place]) 06/20/2018  . Iron deficiency 01/21/2018  . Fall 01/09/2018  . Advanced care planning/counseling discussion 11/20/2017  . Melena   . Diverticulosis of colon with hemorrhage   . Debility 04/03/2017  . Hyponatremia 07/09/2015  . Hearing loss of both ears 04/08/2014  . HTN (hypertension) 03/27/2014  . Slow transit constipation 03/27/2014  . Skin lesion of face 03/27/2014  . Rotator cuff syndrome of right shoulder 09/30/2012  . Breast cancer ([place])   . Lung cancer, lower lobe ([place]) 10/25/2011  . PSVT  (paroxysmal supraventricular tachycardia) ([place]) 03/29/2011  . INTERDIGITAL NEUROMA 03/28/2010  . IMPINGEMENT SYNDROME 03/28/2010  . THYROID NODULE, RIGHT 09/06/2008  . LEG EDEMA, BILATERAL 12/22/2007  . Depression with anxiety 07/31/2007  . Other specified disorders of adrenal gland ([place]) 05/14/2007  . Hyperlipidemia 01/28/2007  . Blood loss anemia 01/28/2007  . Allergic rhinitis 01/28/2007  . GERD (gastroesophageal reflux disease) 01/28/2007  . OVERACTIVE BLADDER 01/28/2007  . Osteoarthritis 01/28/2007  . Pain in lower back 01/28/2007  . Osteoporosis 01/28/2007    Orientation RESPIRATION BLADDER Height & Weight     Time, Self, Situation, Place  Normal Incontinent Weight: 145 lb 1 oz (65.8 kg) Height:  5' 4"  (162.6 cm)  BEHAVIORAL SYMPTOMS/MOOD NEUROLOGICAL BOWEL NUTRITION STATUS      Continent Diet (Dysphagia 3 diet)  AMBULATORY STATUS COMMUNICATION OF NEEDS Skin   Limited Assist   PU Stage and Appropriate Care PU Stage 1 Dressing:  (PRN dressing change) PU Stage 2 Dressing:  (Daily)                   Personal Care Assistance Level of Assistance  Bathing, Dressing, Feeding Bathing Assistance: Limited assistance Feeding assistance: Independent Dressing Assistance: Limited assistance     Functional Limitations Info  Sight, Hearing, Speech Sight Info: Adequate Hearing Info: Adequate Speech Info: Adequate    SPECIAL CARE FACTORS FREQUENCY  PT (By licensed PT), OT (By licensed OT)     PT Frequency: Minimum 2x a week OT Frequency: Minimum 2x a week            Contractures Contractures Info: Not present    Additional Factors Info  Code Status, Allergies Code Status Info: DNR Allergies Info: Augmentin, Ibuprofen Naproxen Nsaids Statins, Surgilube, Tape           Current Medications (07/29/2020):  This is the current hospital active medication list Current Facility-Administered Medications  Medication Dose Route Frequency Provider Last Rate Last Admin  .  (feeding supplement) PROSource Plus liquid 30 mL  30 mL Oral BID BM Elodia Florence., MD   30 mL at 07/29/20 1007  . acetaminophen (TYLENOL) tablet 650 mg  650 mg Oral Q6H PRN Bonnielee Haff, MD   650 mg at 07/29/20 0836   Or  . acetaminophen (TYLENOL) suppository 650 mg  650 mg Rectal Q6H PRN Bonnielee Haff, MD      . atenolol (TENORMIN) tablet 25 mg  25 mg Oral Daily Bonnielee Haff, MD   25 mg at 07/29/20 1006  . cephALEXin (KEFLEX) capsule 500 mg  500 mg Oral Q6H Elodia Florence., MD   500 mg at 07/29/20 0522  . diclofenac Sodium (VOLTAREN) 1 % topical gel 2 g  2 g Topical QID Elodia Florence., MD   2 g at 07/29/20 1007  . enoxaparin (LOVENOX) injection 40 mg  40 mg Subcutaneous Q24H Bonnielee Haff, MD   40 mg at 07/28/20 2208  . gabapentin (NEURONTIN) capsule 100 mg  100 mg Oral BID Bonnielee Haff, MD   100 mg at 07/29/20 1006  . latanoprost (XALATAN) 0.005 % ophthalmic solution 1 drop  1 drop Both Eyes QHS Bonnielee Haff, MD   1 drop at 07/28/20 2209  . magic mouthwash  10 mL Oral TID PRN Elodia Florence., MD      . multivitamin with minerals tablet 1 tablet  1 tablet Oral Daily Elodia Florence., MD   1 tablet at 07/29/20 1005  . ondansetron (ZOFRAN) tablet 4 mg  4 mg Oral Q6H PRN Bonnielee Haff, MD       Or  . ondansetron [facility]) injection 4 mg  4 mg Intravenous Q6H PRN Bonnielee Haff, MD      . oxyCODONE (Oxy IR/ROXICODONE) immediate release tablet 2.5 mg  2.5 mg Oral Q4H PRN Elodia Florence., MD   2.5 mg at 07/29/20 0522   Or  . oxyCODONE (Oxy IR/ROXICODONE) immediate release tablet 5 mg  5 mg Oral Q6H PRN Elodia Florence., MD      . pantoprazole (PROTONIX) EC tablet 40 mg  40 mg Oral Daily Bonnielee Haff, MD   40 mg at 07/29/20 1006  . sodium chloride tablet 1 g  1 g Oral BID WC Bonnielee Haff, MD   1 g at 07/29/20 1423     Discharge Medications: Please see discharge summary for a list of discharge medications.  Relevant Imaging  Results:  Relevant Lab Results:   Additional Information SSN [id number]  Ross Ludwig, LCSW

## 2020-07-29 NOTE — Progress Notes (Addendum)
Physical Therapy Treatment Patient Details Name: [name] MRN: [id number] DOB: 1921-01-16 Today's Date: 07/29/2020    History of Present Illness 86 yo female admitted with hyponatremia, R hip and R foot pain. Hx of DM, CHF, breast ca, OA, LBP, lymphedema, osteopenia, osteporosis, PSVT    PT Comments    Pt reports R hip pain was severe last night. Mod/max assist for bed mobility, Max A sit to stand, mod A to pivot to recliner with RW. Activity tolerance limited by 8/10 R hip pain.   Follow Up Recommendations  SNF;Home health PT;Supervision/Assistance - 24 hour; assistance for mobility     Equipment Recommendations  None recommended by PT    Recommendations for Other Services       Precautions / Restrictions Precautions Precautions: Fall Restrictions Weight Bearing Restrictions: No    Mobility  Bed Mobility Overal bed mobility: Needs Assistance Bed Mobility: Rolling;Sidelying to Sit Rolling: Min assist Sidelying to sit: HOB elevated;Mod assist       General bed mobility comments: Mod A to raise trunk, increased time/effort 2* R hip pain  Transfers Overall transfer level: Needs assistance Equipment used: Rolling walker (2 wheeled) (gait belt) Transfers: Stand Pivot Transfers;Sit to/from Stand Sit to Stand: +2 physical assistance;+2 safety/equipment;Max assist Stand pivot transfers: Mod assist;+2 physical assistance;+2 safety/equipment       General transfer comment: cues for hand placement, mod A to perform stand pivot from EOB to chair  Ambulation/Gait                 Stairs             Wheelchair Mobility    Modified Rankin (Stroke Patients Only)       Balance   Sitting-balance support: Feet supported;No upper extremity supported Sitting balance-Leahy Scale: Fair     Standing balance support: Bilateral upper extremity supported Standing balance-Leahy Scale: Poor                              Cognition  Arousal/Alertness: Awake/alert Behavior During Therapy: WFL for tasks assessed/performed Overall Cognitive Status: Within Functional Limits for tasks assessed                                        Exercises      General Comments        Pertinent Vitals/Pain Pain Score: 8  Pain Location: R hip Pain Descriptors / Indicators: Grimacing;Guarding;Sore Pain Intervention(s): Limited activity within patient's tolerance;Monitored during session;Premedicated before session;Repositioned;Heat applied    Home Living                      Prior Function            PT Goals (current goals can now be found in the care plan section) Acute Rehab PT Goals Patient Stated Goal: less pain. regain mobility/independence PT Goal Formulation: With patient/family Time For Goal Achievement: 08/10/20 Potential to Achieve Goals: Fair Progress towards PT goals: Progressing toward goals    Frequency    Min 3X/week      PT Plan Current plan remains appropriate    Co-evaluation              AM-PAC PT "6 Clicks" Mobility   Outcome Measure  Help needed turning from your back to your side while in a flat bed without  using bedrails?: A Lot Help needed moving from lying on your back to sitting on the side of a flat bed without using bedrails?: A Lot Help needed moving to and from a bed to a chair (including a wheelchair)?: A Lot Help needed standing up from a chair using your arms (e.g., wheelchair or bedside chair)?: A Lot Help needed to walk in hospital room?: Total Help needed climbing 3-5 steps with a railing? : Total 6 Click Score: 10    End of Session Equipment Utilized During Treatment: Gait belt Activity Tolerance: Patient limited by fatigue;Patient limited by pain Patient left: with call bell/phone within reach;in chair;with chair alarm set;with nursing/sitter in room Nurse Communication: Mobility status PT Visit Diagnosis: Muscle weakness (generalized)  (M62.81);Pain;Other abnormalities of gait and mobility (R26.89) Pain - Right/Left: Right Pain - part of body: Hip     Time: 0920-0940 PT Time Calculation (min) (ACUTE ONLY): 20 min  Charges:  $Therapeutic Activity: 8-22 mins                    Blondell Reveal Kistler PT 07/29/2020  Acute Rehabilitation Services Pager [phone] Office [phone]

## 2020-08-01 ENCOUNTER — Non-Acute Institutional Stay: Payer: Medicare HMO | Admitting: Nurse Practitioner

## 2020-08-01 ENCOUNTER — Encounter: Payer: Self-pay | Admitting: Nurse Practitioner

## 2020-08-01 DIAGNOSIS — D500 Iron deficiency anemia secondary to blood loss (chronic): Secondary | ICD-10-CM

## 2020-08-01 DIAGNOSIS — K219 Gastro-esophageal reflux disease without esophagitis: Secondary | ICD-10-CM

## 2020-08-01 DIAGNOSIS — L03031 Cellulitis of right toe: Secondary | ICD-10-CM | POA: Diagnosis not present

## 2020-08-01 DIAGNOSIS — B370 Candidal stomatitis: Secondary | ICD-10-CM | POA: Diagnosis not present

## 2020-08-01 DIAGNOSIS — J90 Pleural effusion, not elsewhere classified: Secondary | ICD-10-CM | POA: Insufficient documentation

## 2020-08-01 DIAGNOSIS — G629 Polyneuropathy, unspecified: Secondary | ICD-10-CM | POA: Diagnosis not present

## 2020-08-01 DIAGNOSIS — K5901 Slow transit constipation: Secondary | ICD-10-CM

## 2020-08-01 DIAGNOSIS — R0789 Other chest pain: Secondary | ICD-10-CM | POA: Diagnosis not present

## 2020-08-01 DIAGNOSIS — M25551 Pain in right hip: Secondary | ICD-10-CM | POA: Diagnosis not present

## 2020-08-01 DIAGNOSIS — I10 Essential (primary) hypertension: Secondary | ICD-10-CM

## 2020-08-01 DIAGNOSIS — N959 Unspecified menopausal and perimenopausal disorder: Secondary | ICD-10-CM

## 2020-08-01 DIAGNOSIS — F418 Other specified anxiety disorders: Secondary | ICD-10-CM

## 2020-08-01 DIAGNOSIS — R69 Illness, unspecified: Secondary | ICD-10-CM | POA: Diagnosis not present

## 2020-08-01 DIAGNOSIS — E871 Hypo-osmolality and hyponatremia: Secondary | ICD-10-CM | POA: Diagnosis not present

## 2020-08-01 NOTE — Assessment & Plan Note (Signed)
Stable, chronic Estradiol.

## 2020-08-01 NOTE — Assessment & Plan Note (Signed)
R hip pain, Oxycodone 2.14m prn effective, tolerated.

## 2020-08-01 NOTE — Assessment & Plan Note (Signed)
Blood pressure is controlled, continue Atenolol.

## 2020-08-01 NOTE — Assessment & Plan Note (Signed)
Stable, continue Omeprazole.

## 2020-08-01 NOTE — Assessment & Plan Note (Signed)
Complete Keflex for right toe cellulitis. Negative X-ray in hospital.
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
## 2020-08-01 NOTE — Assessment & Plan Note (Signed)
L pleural effusion, chest X-ray at [facility] 07/29/20 chronic parenchymal pleural scarring seen at the left lung base

## 2020-08-01 NOTE — Assessment & Plan Note (Signed)
Lower body weakness, continue Gabapentin, Tylenol, Oxycodone.

## 2020-08-01 NOTE — Assessment & Plan Note (Signed)
Hospitalized 8/10-8/13 for low sodium, improved after IVF, fluid restriction 1293m/day, f/u CBC/CMP, f/u am Cortisol, am cortisol level was lowe in hospital

## 2020-08-01 NOTE — Assessment & Plan Note (Signed)
Her mood is stable, continue Duloxetine

## 2020-08-01 NOTE — Assessment & Plan Note (Signed)
Stable, continue Fe, Vit B12. Folate, update CBC/diff.

## 2020-08-01 NOTE — Assessment & Plan Note (Signed)
Right sided tongue sore, canker ulcer, thick yellow coating on tongue, Magic mouth wash 34m S/S ac and hs x 10 days, prn OTC Oraljel.

## 2020-08-01 NOTE — Assessment & Plan Note (Signed)
Continue MiraLax, Senokot S

## 2020-08-02 DIAGNOSIS — E876 Hypokalemia: Secondary | ICD-10-CM | POA: Diagnosis not present

## 2020-08-04 ENCOUNTER — Telehealth: Payer: Self-pay | Admitting: Family Medicine

## 2020-08-04 DIAGNOSIS — M25551 Pain in right hip: Secondary | ICD-10-CM

## 2020-08-04 NOTE — Telephone Encounter (Signed)
Patient's son called.   Said he was told to call back with an update on the patient's condition. He said last night she reported that her hip was hurting pretty bad after therapy but it was the first complaint in several days. She's also been experiencing a new pain in her rib area.   Call back: [phone]

## 2020-08-04 NOTE — Telephone Encounter (Signed)
Do you want to order the MRI?

## 2020-08-05 NOTE — Telephone Encounter (Signed)
Taken care of

## 2020-08-05 NOTE — Telephone Encounter (Signed)
Forwarding to scheduler so they are aware that MRI is no longer stat

## 2020-08-05 NOTE — Telephone Encounter (Signed)
I spoke with the son. He stated pt hasnt started PT yet and would like to wait on having the MRI until she has done more PT. I advised him that when they call to schedule him that he can schedule for a couple weeks out to give her a little time with PT. He stated understanding

## 2020-08-05 NOTE — Telephone Encounter (Signed)
Stat MRI ordered.

## 2020-08-05 NOTE — Addendum Note (Signed)
Addended by: Hortencia Pilar on: 08/05/2020 07:55 AM   Modules accepted: Orders

## 2020-08-08 ENCOUNTER — Other Ambulatory Visit: Payer: Self-pay | Admitting: *Deleted

## 2020-08-08 MED ORDER — OXYCODONE HCL 5 MG PO TABS: 2.5000 mg | ORAL_TABLET | Freq: Four times a day (QID) | ORAL | 0 refills | Status: DC

## 2020-08-08 NOTE — Telephone Encounter (Signed)
Received refill Request from [facility] and sent to Dr. Lyndel Safe for approval.

## 2020-08-09 ENCOUNTER — Telehealth: Payer: Self-pay | Admitting: Family Medicine

## 2020-08-09 ENCOUNTER — Ambulatory Visit
Admission: RE | Admit: 2020-08-09 | Discharge: 2020-08-09 | Disposition: A | Discharge status: Home / Self Care | Payer: Medicare HMO | Source: Ambulatory Visit | Attending: Family Medicine | Admitting: Family Medicine

## 2020-08-09 ENCOUNTER — Other Ambulatory Visit: Payer: Self-pay

## 2020-08-09 DIAGNOSIS — M25551 Pain in right hip: Secondary | ICD-10-CM

## 2020-08-09 DIAGNOSIS — E876 Hypokalemia: Secondary | ICD-10-CM | POA: Diagnosis not present

## 2020-08-09 NOTE — Telephone Encounter (Signed)
Hip MRI looks good, no sign of fracture, infection, or loosening of the prosthesis.  Unfortunately, no explanation for her pain.  If still not feeling any better, I will order x-rays and MRI of the lumbar spine to look for a pinched nerve.

## 2020-08-10 LAB — CBC AND DIFFERENTIAL
HCT: 33 — AB (ref 36–46)
Hemoglobin: 10.8 — AB (ref 12.0–16.0)
Neutrophils Absolute: 4576
Platelets: 254 (ref 150–399)
WBC: 6.7

## 2020-08-10 LAB — COMPREHENSIVE METABOLIC PANEL
Albumin: 3.5 (ref 3.5–5.0)
Calcium: 9 (ref 8.7–10.7)
Globulin: 3.4

## 2020-08-10 LAB — CBC: RBC: 3.35 — AB (ref 3.87–5.11)

## 2020-08-10 LAB — HEPATIC FUNCTION PANEL
ALT: 16 (ref 7–35)
AST: 22 (ref 13–35)
Alkaline Phosphatase: 101 (ref 25–125)
Bilirubin, Total: 0.5

## 2020-08-10 LAB — BASIC METABOLIC PANEL
BUN: 25 — AB (ref 4–21)
CO2: 24 — AB (ref 13–22)
Chloride: 96 — AB (ref 99–108)
Creatinine: 0.8 (ref 0.5–1.1)
Glucose: 73
Potassium: 4.4 (ref 3.4–5.3)
Sodium: 130 — AB (ref 137–147)

## 2020-08-10 NOTE — Addendum Note (Signed)
Addended by: Hortencia Pilar on: 08/10/2020 04:38 PM   Modules accepted: Orders

## 2020-08-10 NOTE — Telephone Encounter (Signed)
I called and spoke with [name], he states that she is still hurting, and especially with up and down motions.  He would like for Dr. Junius Roads to contact the PT dept at [facility] where the patient is to see how much PT is actually helping her. The [facility] is Seth Bake

## 2020-08-10 NOTE — Telephone Encounter (Signed)
Lumbar X-Rays and MRI ordered at [facility].  Ok to have her PT call me with a progress report.

## 2020-08-11 NOTE — Telephone Encounter (Signed)
I called and left a message on the [employer] mail (physical therapy for the residents) to call back regarding the patient. Just need to know what types of exercises/therapy is being done for the patient currently. Dr. Junius Roads said he would talk to the physical therapist when they call back.

## 2020-08-12 ENCOUNTER — Telehealth: Payer: Self-pay

## 2020-08-12 ENCOUNTER — Other Ambulatory Visit: Payer: Self-pay | Admitting: Family Medicine

## 2020-08-12 DIAGNOSIS — M25551 Pain in right hip: Secondary | ICD-10-CM

## 2020-08-12 NOTE — Telephone Encounter (Signed)
Dr. Junius Roads spoke with [name] with [facility] this morning.

## 2020-08-12 NOTE — Telephone Encounter (Signed)
Leroy Sea called stating he's been contacted to set up a third MRI and the pt hasn't even had the one for her lumbar region and Leroy Sea would like a CB to be updated with everything going on.  [phone]

## 2020-08-12 NOTE — Telephone Encounter (Signed)
I called and advised [name] of this. He said he will explain this to his mom (the patient).

## 2020-08-12 NOTE — Telephone Encounter (Signed)
The PT said she is still having rib pain.  A disc protrusion in the thoracic spine can cause nerve impingement that can radiate pain along the ribs, so that's why MRI has been ordered.  Also, a lower thoracic or upper lumbar disc protrusion can cause groin pain.

## 2020-08-12 NOTE — Progress Notes (Signed)
Spoke to PT. Still in severe hip pain, also rib pain.  Will order x-rays and MRI of thoracic spine in addition to lumbar.
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
## 2020-08-16 ENCOUNTER — Non-Acute Institutional Stay: Payer: Medicare HMO | Admitting: Internal Medicine

## 2020-08-16 ENCOUNTER — Encounter: Payer: Self-pay | Admitting: Internal Medicine

## 2020-08-16 DIAGNOSIS — I471 Supraventricular tachycardia: Secondary | ICD-10-CM | POA: Diagnosis not present

## 2020-08-16 DIAGNOSIS — G629 Polyneuropathy, unspecified: Secondary | ICD-10-CM

## 2020-08-16 DIAGNOSIS — D649 Anemia, unspecified: Secondary | ICD-10-CM | POA: Diagnosis not present

## 2020-08-16 DIAGNOSIS — E871 Hypo-osmolality and hyponatremia: Secondary | ICD-10-CM

## 2020-08-16 DIAGNOSIS — M25551 Pain in right hip: Secondary | ICD-10-CM | POA: Diagnosis not present

## 2020-08-16 LAB — CORTISOL: Cortisol: 10.5

## 2020-08-16 NOTE — Progress Notes (Signed)
Location:   [facility] Room Number: [place] of Service:  ALF [phone]) Provider:  Veleta Miners MD  Virgie Dad, MD  Patient Care Team: Virgie Dad, MD as PCP - General (Internal Medicine) Lanelle Bal., MD as Referring Physician (Internal Medicine) Satira Sark, MD as Consulting Physician (Cardiology) Kyung Rudd, MD as Consulting Physician (Radiation Oncology) Mast, Man X, NP as Nurse Practitioner (Internal Medicine)  Extended Emergency Contact Information Primary Emergency Contact: Neita Garnet of [place] Phone: [phone] Mobile Phone: [phone] Relation: Legal Guardian Secondary Emergency Contact: Podgurski,Lance Address: [address]          [place], [place] [address] [name] of [place] Phone: [phone] Mobile Phone: [phone] Relation: Son  Code Status:  DNR Goals of care: Advanced Directive information Advanced Directives 08/16/2020  Does Patient Have a Medical Advance Directive? Yes  Type of Advance Directive Out of facility DNR (pink MOST or yellow form);Living will;Healthcare Power of Attorney  Does patient want to make changes to medical advance directive? No - Patient declined  Copy of [place] in Chart? Yes - validated most recent copy scanned in chart (See row information)  Would patient like information on creating a medical advance directive? -  Pre-existing out of facility DNR order (yellow form or pink MOST form) Pink MOST form placed in chart (order not valid for inpatient use)     Chief Complaint  Patient presents with  . Medical Management of Chronic Issues    Pain control     HPI:  Pt is a 86 y.o. female seen today for medical management of chronic diseases.     Past Medical History:  Diagnosis Date  . Allergic rhinitis   . Anemia    NOS  . Anxiety   . Biceps tendon rupture    Bilateral  . Breast cancer ([facility]) 2001   Left s/p lumpectomy and XRT    . Bronchiectasis ([facility]) 2014   Noted on chest x-ray  . Complication of anesthesia    slow to wake up  . Cough 2014  . Depression   . Diverticulosis   . Elevated liver enzymes    Biopsy consistent with steatohepatitis  . Fundic gland polyps of stomach, benign   . GERD (gastroesophageal reflux disease) 06/2007   EGD Dr Gala Romney >sm HH, multiple fundic gland polyps  . Glaucoma 2013   both eyes  . Hemorrhoids   . Hiatal hernia   . Hx of radiation therapy 07/17/10 to 07/21/10   SRS LLL lung  . Hyperlipidemia   . Insomnia   . Low back pain    scoliosis  . Lung cancer ([facility]) 05/2010   Left 2011 - rad x 3  . Lymphedema    Left arm  . Osteoarthritis   . Osteopenia 10/25/2016  . Osteoporosis, senile   . Overactive bladder   . Pneumonia    RLL with sepsis  . PSVT (paroxysmal supraventricular tachycardia) ([facility])   . Rheumatic fever    Age 62  . Right thyroid nodule   . Steatohepatitis    liver bx  . Type 2 diabetes mellitus ([facility])    Past Surgical History:  Procedure Laterality Date  . ABDOMINAL HYSTERECTOMY  1964  . APPENDECTOMY  1964  . BIOPSY THYROID  11/2008  . BREAST BIOPSY     X 3 w/ cystectomy  . BREAST LUMPECTOMY  2001  . CATARACT EXTRACTION Bilateral 2003   Lens implant  Dr. Charise Killian  . CHOLECYSTECTOMY  1965  . COLONOSCOPY  09/11/2011   Procedure: COLONOSCOPY;  Surgeon: Dorothyann Peng, MD;  [facility];  Service: Endoscopy;  Laterality: N/A;  . COLONOSCOPY  2005 /2012   Dr. Lucianne Muss- diverticulosis, ext hemorrhoids.  . COLONOSCOPY WITH PROPOFOL N/A 11/09/2017   Procedure: COLONOSCOPY WITH PROPOFOL;  Surgeon: Mauri Pole, MD;  Location: [facility];  Service: Endoscopy;  Laterality: N/A;  . COLONOSCOPY WITH PROPOFOL N/A 11/12/2017   Procedure: COLONOSCOPY WITH PROPOFOL;  Surgeon: Ladene Artist, MD;  Location: [facility];  Service: Endoscopy;  Laterality: N/A;  . CYSTOSCOPY  2007  . ESOPHAGOGASTRODUODENOSCOPY  09/11/2011   Procedure:  ESOPHAGOGASTRODUODENOSCOPY (EGD);  Surgeon: Dorothyann Peng, MD;  [facility];  Service: Endoscopy;  Laterality: N/A;  . ESOPHAGOGASTRODUODENOSCOPY (EGD) WITH PROPOFOL N/A 11/08/2017   Procedure: ESOPHAGOGASTRODUODENOSCOPY (EGD) WITH PROPOFOL;  Surgeon: Mauri Pole, MD;  Location: [facility];  Service: Endoscopy;  Laterality: N/A;  . LIVER BIOPSY  2008  . LUNG BIOPSY Left 06/06/2010  . REVISION TOTAL HIP ARTHROPLASTY  2007   Left  . SKIN CANCER EXCISION     nose and left lower cheek  . TONSILLECTOMY  1930  . TOTAL HIP ARTHROPLASTY Bilateral 2005 & 2007    Dr. Earl Lagos. Aline Brochure     Allergies  Allergen Reactions  . Augmentin [Amoxicillin-Pot Clavulanate] Other (See Comments)    unknown  . Ibuprofen Hives  . Naproxen Other (See Comments)    Unknown Tears stomach up  . Nsaids     Tears stomach up  . Statins Other (See Comments)    Feels like knives in stomach  . Surgilube [Gyne-Moistrin]     Rectal itching, burning  . Tape Rash    Allergies as of 08/16/2020      Reactions   Augmentin [amoxicillin-pot Clavulanate] Other (See Comments)   unknown   Ibuprofen Hives   Naproxen Other (See Comments)   Unknown Tears stomach up   Nsaids    Tears stomach up   Statins Other (See Comments)   Feels like knives in stomach   Surgilube [gyne-moistrin]    Rectal itching, burning   Tape Rash      Medication List       Accurate as of August 16, 2020  1:15 PM. If you have any questions, ask your nurse or doctor.        acetaminophen 500 MG tablet Commonly known as: TYLENOL Take 500 mg by mouth in the morning, at noon, and at bedtime.   atenolol 25 MG tablet Commonly known as: TENORMIN Take 1 tablet (25 mg total) by mouth daily.   B Complex-B12 Tabs Take 1 tablet by mouth daily.   diclofenac Sodium 1 % Gel Commonly known as: Voltaren Apply 2 g topically 4 (four) times daily as needed.   DULoxetine 30 MG capsule Commonly known as: CYMBALTA Take 1  capsule (30 mg total) by mouth daily.   ferrous sulfate 220 (44 Fe) MG/5ML solution Take 220 mg by mouth daily. 5 mL by mouth daily with meal Once A Day   folic acid 660 MCG tablet Commonly known as: FOLVITE Take 1 tablet (400 mcg total) by mouth daily.   gabapentin 100 MG capsule Commonly known as: NEURONTIN Take 1 capsule (100 mg total) by mouth 2 (two) times daily.   Imvexxy Maintenance Pack 4 MCG Inst Generic drug: Estradiol Place 4 mcg vaginally once a week. On Thursday   latanoprost 0.005 %  ophthalmic solution Commonly known as: XALATAN Place 1 drop into both eyes at bedtime.   magnesium oxide 400 (241.3 Mg) MG tablet Commonly known as: MAG-OX Take 1 tablet (400 mg total) by mouth 2 (two) times daily.   mupirocin ointment 2 % Commonly known as: BACTROBAN Apply 1 application topically daily as needed (nasal sores).   omeprazole 20 MG capsule Commonly known as: PRILOSEC Take 1 capsule (20 mg total) by mouth daily.   oxyCODONE 5 MG immediate release tablet Commonly known as: Oxy IR/ROXICODONE Take 0.5 tablets (2.5 mg total) by mouth every 6 (six) hours.   polyethylene glycol 17 g packet Commonly known as: MIRALAX / GLYCOLAX Take 17 g by mouth daily.   PreserVision AREDS 2 Caps Take 1 capsule by mouth 2 (two) times daily.   Senokot S 8.6-50 MG tablet Generic drug: senna-docusate Take 1 tablet by mouth daily.   simethicone 80 MG chewable tablet Commonly known as: MYLICON Chew 80 mg by mouth 4 (four) times daily as needed for flatulence.   sodium chloride 1 g tablet Take 1 g by mouth 2 (two) times daily with a meal.       Review of Systems  Immunization History  Administered Date(s) Administered  . Influenza Whole 09/22/2008, 09/16/2012, 09/30/2013  . Influenza, High Dose Seasonal PF 09/25/2017, 09/19/2019  . Influenza,inj,Quad PF,6+ Mos 10/09/2018  . Influenza-Unspecified 10/02/2014, 09/15/2015, 09/27/2016  . Moderna SARS-COVID-2 Vaccination  12/19/2019, 01/16/2020  . Pneumococcal Conjugate-13 11/29/2017  . Pneumococcal Polysaccharide-23 08/17/2006  . Td 12/17/2005   Pertinent  Health Maintenance Due  Topic Date Due  . FOOT EXAM  Never done  . URINE MICROALBUMIN  04/30/2008  . OPHTHALMOLOGY EXAM  12/27/2019  . INFLUENZA VACCINE  07/17/2020  . HEMOGLOBIN A1C  11/17/2020  . DEXA SCAN  Completed  . PNA vac Low Risk Adult  Completed   Fall Risk  10/07/2018 09/19/2017 05/17/2016 04/20/2016 04/19/2016  Falls in the past year? Yes No No No No  Number falls in past yr: 1 - - - -  Injury with Fall? No - - - -   Functional Status Survey:    Vitals:   08/16/20 1158  BP: 122/70  Pulse: 62  Resp: (!) 22  Temp: (!) 97.2 F (36.2 C)  Weight: 136 lb 12.8 oz (62.1 kg)  Height: 5' 4"  (1.626 m)   Body mass index is 23.48 kg/m. Physical Exam  Labs reviewed: Recent Labs    06/21/20 0000 07/19/20 0000 07/27/20 0433 07/27/20 0433 07/28/20 0436 07/29/20 0422 08/10/20 0000  NA 133*   < > 127*   < > 131* 130* 130*  K 5.0   < > 4.0   < > 4.0 3.9 4.4  CL 98*   < > 93*   < > 96* 96* 96*  CO2 30*   < > 25   < > 26 26 24*  GLUCOSE  --    < > 98  --  94 94  --   BUN 24*   < > 14   < > 15 17 25*  CREATININE 0.9   < > 0.67   < > 0.79 0.78 0.8  CALCIUM 8.7   < > 8.6*   < > 8.9 9.1 9.0  MG 1.8  --   --   --  1.9 1.8  --   PHOS  --   --   --   --  3.2 3.5  --    < > = values  in this interval not displayed.   Recent Labs    07/27/20 0433 07/27/20 0433 07/28/20 0436 07/29/20 0422 08/10/20 0000  AST 34   < > 27 24 22   ALT 26   < > 24 22 16   ALKPHOS 89   < > 83 77 101  BILITOT 0.5  --  0.7 0.8  --   PROT 6.8  --  6.7 6.8  --   ALBUMIN 3.1*   < > 3.2* 3.1* 3.5   < > = values in this interval not displayed.   Recent Labs    07/27/20 0433 07/27/20 0433 07/28/20 0436 07/29/20 0422 08/10/20 0000  WBC 7.0   < > 7.4 8.0 6.7  NEUTROABS  --   --  5.1 5.8 4,576  HGB 11.7*   < > 12.2 11.5* 10.8*  HCT 36.1   < > 38.0 34.9* 33*    MCV 99.2  --  98.4 96.7  --   PLT 262   < > 287 284 254   < > = values in this interval not displayed.   Lab Results  Component Value Date   TSH 2.338 07/26/2020   Lab Results  Component Value Date   HGBA1C 5.3 05/18/2020   Lab Results  Component Value Date   CHOL 188 04/12/2016   HDL 62 04/12/2016   LDLCALC 102 04/12/2016   TRIG 122 04/12/2016   CHOLHDL 3.0 Ratio 06/07/2008    Significant Diagnostic Results in last 30 days:  MR Hip Right w/o contrast  Result Date: 08/09/2020 CLINICAL DATA:  Right hip pain for 1 month. History of prior hip replacement. EXAM: MR OF THE RIGHT HIP WITHOUT CONTRAST TECHNIQUE: Multiplanar, multisequence MR imaging was performed. No intravenous contrast was administered. COMPARISON:  Plain films of the hips 07/11/2020. FINDINGS: Bones/Joint/Cartilage Despite using artifact reduction techniques in this patient with bilateral hip arthroplasties, there is artifact from the study somewhat limiting the exam. No fracture, stress change or worrisome lesion is identified. There is no evidence of hardware loosening. No bony destructive change about either hip. No joint effusion. No mass or fluid collection is seen about either hip. Ligaments Visualization is limited but no obvious abnormality is seen. Muscles and Tendons Intact and unremarkable. Soft tissues Imaged intrapelvic contents demonstrate sigmoid diverticulosis. The patient appears to be status post hysterectomy. IMPRESSION: No acute abnormality or finding to explain the patient's symptoms. No complicating feature related to the patient's right hip replacement. Diverticulosis. Electronically Signed   By: Inge Rise M.D.   On: 08/09/2020 15:19   US Guided Needle Placement  Result Date: 07/25/2020 Limited diagnostic ultrasound of the anterior hip reveals no obvious joint effusion.  I believe the iliopsoas tendon is intact.  DG CHEST PORT 1 VIEW  Result Date: 07/27/2020 CLINICAL DATA:  Shortness of  breath. EXAM: PORTABLE CHEST 1 VIEW COMPARISON:  02/25/2019 FINDINGS: The heart is within normal limits in size given the AP projection and portable technique. Mild tortuosity and calcification of the thoracic aorta. Chronic or recurrent left pleural effusion and left lower lobe atelectasis. Minimal streaky right basilar atelectasis. No pulmonary edema or worrisome pulmonary lesions. The bony thorax is intact IMPRESSION: Chronic or recurrent left pleural effusion and left lower lobe atelectasis. Electronically Signed   By: Marijo Sanes M.D.   On: 07/27/2020 05:45   DG Toe Great Right  Result Date: 07/27/2020 CLINICAL DATA:  Rolled over toe EXAM: RIGHT GREAT TOE COMPARISON:  None. FINDINGS: Osteopenia. No acute fracture or dislocation.  Joint spaces and alignment are maintained for age. No area of erosion or osseous destruction. No unexpected radiopaque foreign body. Soft tissues are unremarkable. IMPRESSION: No acute fracture or dislocation. Electronically Signed   By: Valentino Saxon MD   On: 07/27/2020 11:30    Assessment/Plan There are no diagnoses linked to this encounter.   Family/ staff Communication:   Labs/tests ordered:

## 2020-08-16 NOTE — Progress Notes (Signed)
Location: [facility] Room Number: 876 Place of Service:  ALF (13)  Provider:   Code Status:  Goals of Care:  Advanced Directives 08/16/2020  Does Patient Have a Medical Advance Directive? Yes  Type of Advance Directive Out of facility DNR (pink MOST or yellow form);Living will;Healthcare Power of Attorney  Does patient want to make changes to medical advance directive? No - Patient declined  Copy of [place] in Chart? Yes - validated most recent copy scanned in chart (See row information)  Would patient like information on creating a medical advance directive? -  Pre-existing out of facility DNR order (yellow form or pink MOST form) Pink MOST form placed in chart (order not valid for inpatient use)     Chief Complaint  Patient presents with  . Medical Management of Chronic Issues    Pain control     HPI: Patient is a 86 y.o. female seen today for an acute visit for Pain Control for her Right Hip  Patient has a history of diastolic CHF, hyponatremia, hypertension, diabetes mellitus, PSVT and history of breast cancer and lung cancer,.Iron Def Anemiaon Oxygen PRN,Left Sided Pleural Effusion  Admitted in hospital in 8/10-8/13 for Hyponatremia Treated with Fluid restriction and Salt tablets  Her other Acute problem Right  hip pain.  Patient has been having severe right hip pain for past month.  Was seen by Ortho.  Was injected by cortisone.  She states the pain got better for few days but then it came back.  She recently had an MRI of her hip done which did not show any acute issue.  Patient is also scheduled to get MRI of her back.   Meanwhile patient is on oxycodone and Tylenol.  Per nurses they wanted to schedule that as  she is asking for a  q6 hours.  Patient states that her pain is very severe without the Oxy  and Tylenol.  She has hired caregivers to help her with the transfers and the ADLs.  Cannot increase the dose of oxycodone as it  made her drowsy.  Patient denies any muscular cramps. No other complaints today  Past Medical History:  Diagnosis Date  . Allergic rhinitis   . Anemia    NOS  . Anxiety   . Biceps tendon rupture    Bilateral  . Breast cancer ([place]) 2001   Left s/p lumpectomy and XRT   . Bronchiectasis ([place]) 2014   Noted on chest x-ray  . Complication of anesthesia    slow to wake up  . Cough 2014  . Depression   . Diverticulosis   . Elevated liver enzymes    Biopsy consistent with steatohepatitis  . Fundic gland polyps of stomach, benign   . GERD (gastroesophageal reflux disease) 06/2007   EGD Dr Gala Romney >sm HH, multiple fundic gland polyps  . Glaucoma 2013   both eyes  . Hemorrhoids   . Hiatal hernia   . Hx of radiation therapy 07/17/10 to 07/21/10   SRS LLL lung  . Hyperlipidemia   . Insomnia   . Low back pain    scoliosis  . Lung cancer ([place]) 05/2010   Left 2011 - rad x 3  . Lymphedema    Left arm  . Osteoarthritis   . Osteopenia 10/25/2016  . Osteoporosis, senile   . Overactive bladder   . Pneumonia    RLL with sepsis  . PSVT (paroxysmal supraventricular tachycardia) ([place])   . Rheumatic  fever    Age 68  . Right thyroid nodule   . Steatohepatitis    liver bx  . Type 2 diabetes mellitus ([place])     Past Surgical History:  Procedure Laterality Date  . ABDOMINAL HYSTERECTOMY  1964  . APPENDECTOMY  1964  . BIOPSY THYROID  11/2008  . BREAST BIOPSY     X 3 w/ cystectomy  . BREAST LUMPECTOMY  2001  . CATARACT EXTRACTION Bilateral 2003   Lens implant Dr. Charise Killian  . CHOLECYSTECTOMY  1965  . COLONOSCOPY  09/11/2011   Procedure: COLONOSCOPY;  Surgeon: Dorothyann Peng, MD;  [facility];  Service: Endoscopy;  Laterality: N/A;  . COLONOSCOPY  2005 /2012   Dr. Lucianne Muss- diverticulosis, ext hemorrhoids.  . COLONOSCOPY WITH PROPOFOL N/A 11/09/2017   Procedure: COLONOSCOPY WITH PROPOFOL;  Surgeon: Mauri Pole, MD;  Location: [facility];  Service: Endoscopy;  Laterality: N/A;    . COLONOSCOPY WITH PROPOFOL N/A 11/12/2017   Procedure: COLONOSCOPY WITH PROPOFOL;  Surgeon: Ladene Artist, MD;  Location: [facility];  Service: Endoscopy;  Laterality: N/A;  . CYSTOSCOPY  2007  . ESOPHAGOGASTRODUODENOSCOPY  09/11/2011   Procedure: ESOPHAGOGASTRODUODENOSCOPY (EGD);  Surgeon: Dorothyann Peng, MD;  [facility];  Service: Endoscopy;  Laterality: N/A;  . ESOPHAGOGASTRODUODENOSCOPY (EGD) WITH PROPOFOL N/A 11/08/2017   Procedure: ESOPHAGOGASTRODUODENOSCOPY (EGD) WITH PROPOFOL;  Surgeon: Mauri Pole, MD;  Location: [facility];  Service: Endoscopy;  Laterality: N/A;  . LIVER BIOPSY  2008  . LUNG BIOPSY Left 06/06/2010  . REVISION TOTAL HIP ARTHROPLASTY  2007   Left  . SKIN CANCER EXCISION     nose and left lower cheek  . TONSILLECTOMY  1930  . TOTAL HIP ARTHROPLASTY Bilateral 2005 & 2007    Dr. Earl Lagos. Aline Brochure     Allergies  Allergen Reactions  . Augmentin [Amoxicillin-Pot Clavulanate] Other (See Comments)    unknown  . Ibuprofen Hives  . Naproxen Other (See Comments)    Unknown Tears stomach up  . Nsaids     Tears stomach up  . Statins Other (See Comments)    Feels like knives in stomach  . Surgilube [Gyne-Moistrin]     Rectal itching, burning  . Tape Rash    Outpatient Encounter Medications as of 08/16/2020  Medication Sig  . acetaminophen (TYLENOL) 500 MG tablet Take 500 mg by mouth in the morning, at noon, and at bedtime.   Marland Kitchen atenolol (TENORMIN) 25 MG tablet Take 1 tablet (25 mg total) by mouth daily.  . B Complex Vitamins (B COMPLEX-B12) TABS Take 1 tablet by mouth daily.  . diclofenac Sodium (VOLTAREN) 1 % GEL Apply 2 g topically 4 (four) times daily as needed.  . DULoxetine (CYMBALTA) 30 MG capsule Take 1 capsule (30 mg total) by mouth daily.  . Estradiol (IMVEXXY MAINTENANCE PACK) 4 MCG INST Place 4 mcg vaginally once a week. On Thursday  . ferrous sulfate 220 (44 Fe) MG/5ML solution Take 220 mg by mouth daily. 5 mL by mouth  daily with meal Once A Day  . folic acid (FOLVITE) 401 MCG tablet Take 1 tablet (400 mcg total) by mouth daily.  Marland Kitchen gabapentin (NEURONTIN) 100 MG capsule Take 1 capsule (100 mg total) by mouth 2 (two) times daily.  Marland Kitchen latanoprost (XALATAN) 0.005 % ophthalmic solution Place 1 drop into both eyes at bedtime.  . magnesium oxide (MAG-OX) 400 (241.3 Mg) MG tablet Take 1 tablet (400 mg total) by mouth 2 (two) times daily.  Marland Kitchen  Multiple Vitamins-Minerals (PRESERVISION AREDS 2) CAPS Take 1 capsule by mouth 2 (two) times daily.  . mupirocin ointment (BACTROBAN) 2 % Apply 1 application topically daily as needed (nasal sores).   Marland Kitchen omeprazole (PRILOSEC) 20 MG capsule Take 1 capsule (20 mg total) by mouth daily.  Marland Kitchen oxyCODONE (OXY IR/ROXICODONE) 5 MG immediate release tablet Take 0.5 tablets (2.5 mg total) by mouth every 6 (six) hours.  . polyethylene glycol (MIRALAX / GLYCOLAX) 17 g packet Take 17 g by mouth daily.  Marland Kitchen senna-docusate (SENOKOT S) 8.6-50 MG tablet Take 1 tablet by mouth daily.  . simethicone (MYLICON) 80 MG chewable tablet Chew 80 mg by mouth 4 (four) times daily as needed for flatulence.  . sodium chloride 1 g tablet Take 1 g by mouth 2 (two) times daily with a meal.    No facility-administered encounter medications on file as of 08/16/2020.    Review of Systems:  Review of Systems  Constitutional: Positive for activity change.  HENT: Negative.   Respiratory: Negative.   Cardiovascular: Negative.   Gastrointestinal: Positive for constipation.  Genitourinary: Negative.   Musculoskeletal: Positive for arthralgias, gait problem and myalgias.  Skin: Negative.   Neurological: Negative for dizziness.  Psychiatric/Behavioral: Negative.     Health Maintenance  Topic Date Due  . FOOT EXAM  Never done  . URINE MICROALBUMIN  04/30/2008  . OPHTHALMOLOGY EXAM  12/27/2019  . INFLUENZA VACCINE  07/17/2020  . TETANUS/TDAP  10/13/2029 (Originally 12/18/2015)  . HEMOGLOBIN A1C  11/17/2020  . DEXA  SCAN  Completed  . COVID-19 Vaccine  Completed  . PNA vac Low Risk Adult  Completed    Physical Exam: Vitals:   08/16/20 1158  BP: 122/70  Pulse: 62  Resp: (!) 22  Temp: (!) 97.2 F (36.2 C)  Weight: 136 lb 12.8 oz (62.1 kg)  Height: 5' 4"  (1.626 m)   Body mass index is 23.48 kg/m. Physical Exam Vitals reviewed.  Constitutional:      Appearance: Normal appearance.  HENT:     Head: Normocephalic.     Nose: Nose normal.     Mouth/Throat:     Mouth: Mucous membranes are moist.     Pharynx: Oropharynx is clear.  Eyes:     Pupils: Pupils are equal, round, and reactive to light.  Cardiovascular:     Rate and Rhythm: Normal rate and regular rhythm.     Pulses: Normal pulses.  Pulmonary:     Effort: Pulmonary effort is normal. No respiratory distress.     Breath sounds: Normal breath sounds. No wheezing.  Abdominal:     General: Abdomen is flat. Bowel sounds are normal.     Palpations: Abdomen is soft.  Musculoskeletal:        General: No swelling.     Cervical back: Neck supple.     Comments: C/O Pain in her Right Groin area. Not tender  Skin:    General: Skin is warm.  Neurological:     General: No focal deficit present.     Mental Status: She is alert and oriented to person, place, and time.  Psychiatric:        Mood and Affect: Mood normal.        Thought Content: Thought content normal.     Labs reviewed: Basic Metabolic Panel: Recent Labs    06/21/20 0000 07/19/20 0000 07/26/20 1953 07/26/20 1953 07/27/20 0433 07/27/20 0433 07/28/20 0436 07/29/20 0422 08/10/20 0000  NA 133*   < > 126*   < >  127*   < > 131* 130* 130*  K 5.0   < > 4.6   < > 4.0   < > 4.0 3.9 4.4  CL 98*   < > 89*   < > 93*   < > 96* 96* 96*  CO2 30*   < > 29   < > 25   < > 26 26 24*  GLUCOSE  --    < > 93   < > 98  --  94 94  --   BUN 24*   < > 13   < > 14   < > 15 17 25*  CREATININE 0.9   < > 0.81   < > 0.67   < > 0.79 0.78 0.8  CALCIUM 8.7   < > 9.2   < > 8.6*   < > 8.9 9.1 9.0    MG 1.8  --   --   --   --   --  1.9 1.8  --   PHOS  --   --   --   --   --   --  3.2 3.5  --   TSH  --   --  2.338  --   --   --   --   --   --    < > = values in this interval not displayed.   Liver Function Tests: Recent Labs    07/27/20 0433 07/27/20 0433 07/28/20 0436 07/29/20 0422 08/10/20 0000  AST 34   < > 27 24 22   ALT 26   < > 24 22 16   ALKPHOS 89   < > 83 77 101  BILITOT 0.5  --  0.7 0.8  --   PROT 6.8  --  6.7 6.8  --   ALBUMIN 3.1*   < > 3.2* 3.1* 3.5   < > = values in this interval not displayed.   No results for input(s): LIPASE, AMYLASE in the last 8760 hours. No results for input(s): AMMONIA in the last 8760 hours. CBC: Recent Labs    07/27/20 0433 07/27/20 0433 07/28/20 0436 07/29/20 0422 08/10/20 0000  WBC 7.0   < > 7.4 8.0 6.7  NEUTROABS  --   --  5.1 5.8 4,576  HGB 11.7*   < > 12.2 11.5* 10.8*  HCT 36.1   < > 38.0 34.9* 33*  MCV 99.2  --  98.4 96.7  --   PLT 262   < > 287 284 254   < > = values in this interval not displayed.   Lipid Panel: No results for input(s): CHOL, HDL, LDLCALC, TRIG, CHOLHDL, LDLDIRECT in the last 8760 hours. Lab Results  Component Value Date   HGBA1C 5.3 05/18/2020    Procedures since last visit: MR Hip Right w/o contrast  Result Date: 08/09/2020 CLINICAL DATA:  Right hip pain for 1 month. History of prior hip replacement. EXAM: MR OF THE RIGHT HIP WITHOUT CONTRAST TECHNIQUE: Multiplanar, multisequence MR imaging was performed. No intravenous contrast was administered. COMPARISON:  Plain films of the hips 07/11/2020. FINDINGS: Bones/Joint/Cartilage Despite using artifact reduction techniques in this patient with bilateral hip arthroplasties, there is artifact from the study somewhat limiting the exam. No fracture, stress change or worrisome lesion is identified. There is no evidence of hardware loosening. No bony destructive change about either hip. No joint effusion. No mass or fluid collection is seen about either hip.  Ligaments Visualization is limited but no obvious  abnormality is seen. Muscles and Tendons Intact and unremarkable. Soft tissues Imaged intrapelvic contents demonstrate sigmoid diverticulosis. The patient appears to be status post hysterectomy. IMPRESSION: No acute abnormality or finding to explain the patient's symptoms. No complicating feature related to the patient's right hip replacement. Diverticulosis. Electronically Signed   By: Inge Rise M.D.   On: 08/09/2020 15:19   US Guided Needle Placement  Result Date: 07/25/2020 Limited diagnostic ultrasound of the anterior hip reveals no obvious joint effusion.  I believe the iliopsoas tendon is intact.  DG CHEST PORT 1 VIEW  Result Date: 07/27/2020 CLINICAL DATA:  Shortness of breath. EXAM: PORTABLE CHEST 1 VIEW COMPARISON:  02/25/2019 FINDINGS: The heart is within normal limits in size given the AP projection and portable technique. Mild tortuosity and calcification of the thoracic aorta. Chronic or recurrent left pleural effusion and left lower lobe atelectasis. Minimal streaky right basilar atelectasis. No pulmonary edema or worrisome pulmonary lesions. The bony thorax is intact IMPRESSION: Chronic or recurrent left pleural effusion and left lower lobe atelectasis. Electronically Signed   By: Marijo Sanes M.D.   On: 07/27/2020 05:45   DG Toe Great Right  Result Date: 07/27/2020 CLINICAL DATA:  Rolled over toe EXAM: RIGHT GREAT TOE COMPARISON:  None. FINDINGS: Osteopenia. No acute fracture or dislocation. Joint spaces and alignment are maintained for age. No area of erosion or osseous destruction. No unexpected radiopaque foreign body. Soft tissues are unremarkable. IMPRESSION: No acute fracture or dislocation. Electronically Signed   By: Valentino Saxon MD   On: 07/27/2020 11:30    Assessment/Plan Right hip pain Start on Meloxicam 15 mg QD for 7 days and then reval Tylenol 650 mg QID Continue low dose of Oxycodone 2.5 mg QID Canot  tolerate higher doses MRI schedeuled by Dr Junius Roads to find the cause of her pain MRI of hip was negative  Hyponatremia Repeat Sodium stable at 130 On FR and Salt tablets ? Etiology Neuropathy Neurontin and Electric chair PSVT (paroxysmal supraventricular tachycardia) (HCC) Stable on Tenormin Anemia, unspecified type Hgb Stable on Iron .Left Pleural Effusion Has been stable for past few months Will wait and repeat imaging Deprssion with Anxiety akes Cymbalta H/o CHF Euvolemia right now Labs/tests ordered:  * No order type specified * Next appt:  Visit date not found

## 2020-08-17 DIAGNOSIS — M2550 Pain in unspecified joint: Secondary | ICD-10-CM | POA: Diagnosis not present

## 2020-08-17 DIAGNOSIS — M6281 Muscle weakness (generalized): Secondary | ICD-10-CM | POA: Diagnosis not present

## 2020-08-17 DIAGNOSIS — R2681 Unsteadiness on feet: Secondary | ICD-10-CM | POA: Diagnosis not present

## 2020-08-17 DIAGNOSIS — E871 Hypo-osmolality and hyponatremia: Secondary | ICD-10-CM | POA: Diagnosis not present

## 2020-08-17 DIAGNOSIS — M25551 Pain in right hip: Secondary | ICD-10-CM | POA: Diagnosis not present

## 2020-08-19 DIAGNOSIS — E871 Hypo-osmolality and hyponatremia: Secondary | ICD-10-CM | POA: Diagnosis not present

## 2020-08-19 DIAGNOSIS — M2550 Pain in unspecified joint: Secondary | ICD-10-CM | POA: Diagnosis not present

## 2020-08-19 DIAGNOSIS — M6281 Muscle weakness (generalized): Secondary | ICD-10-CM | POA: Diagnosis not present

## 2020-08-19 DIAGNOSIS — M25551 Pain in right hip: Secondary | ICD-10-CM | POA: Diagnosis not present

## 2020-08-19 DIAGNOSIS — R2681 Unsteadiness on feet: Secondary | ICD-10-CM | POA: Diagnosis not present

## 2020-08-22 DIAGNOSIS — E871 Hypo-osmolality and hyponatremia: Secondary | ICD-10-CM | POA: Diagnosis not present

## 2020-08-22 DIAGNOSIS — M25551 Pain in right hip: Secondary | ICD-10-CM | POA: Diagnosis not present

## 2020-08-22 DIAGNOSIS — M6281 Muscle weakness (generalized): Secondary | ICD-10-CM | POA: Diagnosis not present

## 2020-08-22 DIAGNOSIS — M2550 Pain in unspecified joint: Secondary | ICD-10-CM | POA: Diagnosis not present

## 2020-08-22 DIAGNOSIS — R2681 Unsteadiness on feet: Secondary | ICD-10-CM | POA: Diagnosis not present

## 2020-08-23 ENCOUNTER — Encounter: Payer: Self-pay | Admitting: Internal Medicine

## 2020-08-23 ENCOUNTER — Non-Acute Institutional Stay: Payer: Medicare HMO | Admitting: Internal Medicine

## 2020-08-23 DIAGNOSIS — R300 Dysuria: Secondary | ICD-10-CM

## 2020-08-23 DIAGNOSIS — F418 Other specified anxiety disorders: Secondary | ICD-10-CM

## 2020-08-23 DIAGNOSIS — K922 Gastrointestinal hemorrhage, unspecified: Secondary | ICD-10-CM

## 2020-08-23 DIAGNOSIS — R69 Illness, unspecified: Secondary | ICD-10-CM | POA: Diagnosis not present

## 2020-08-23 DIAGNOSIS — D649 Anemia, unspecified: Secondary | ICD-10-CM | POA: Diagnosis not present

## 2020-08-23 DIAGNOSIS — M25551 Pain in right hip: Secondary | ICD-10-CM | POA: Diagnosis not present

## 2020-08-23 DIAGNOSIS — E871 Hypo-osmolality and hyponatremia: Secondary | ICD-10-CM

## 2020-08-23 DIAGNOSIS — R509 Fever, unspecified: Secondary | ICD-10-CM | POA: Diagnosis not present

## 2020-08-23 DIAGNOSIS — I10 Essential (primary) hypertension: Secondary | ICD-10-CM | POA: Diagnosis not present

## 2020-08-23 DIAGNOSIS — G629 Polyneuropathy, unspecified: Secondary | ICD-10-CM | POA: Diagnosis not present

## 2020-08-23 DIAGNOSIS — N760 Acute vaginitis: Secondary | ICD-10-CM | POA: Diagnosis not present

## 2020-08-23 DIAGNOSIS — E876 Hypokalemia: Secondary | ICD-10-CM | POA: Diagnosis not present

## 2020-08-23 DIAGNOSIS — R350 Frequency of micturition: Secondary | ICD-10-CM | POA: Diagnosis not present

## 2020-08-23 NOTE — Progress Notes (Signed)
Location: [facility] Room Number: [place] of Service:  [id number] [phone]  Provider: Veleta Miners, MD  Code Status: DNR Goals of Care:  Advanced Directives 08/23/2020  Does Patient Have a Medical Advance Directive? Yes  Type of Paramedic of [place];Living will;Out of facility DNR (pink MOST or yellow form)  Does patient want to make changes to medical advance directive? No - Patient declined  Copy of [place] in Chart? Yes - validated most recent copy scanned in chart (See row information)  Would patient like information on creating a medical advance directive? -  Pre-existing out of facility DNR order (yellow form or pink MOST form) Pink MOST form placed in chart (order not valid for inpatient use);Yellow form placed in chart (order not valid for inpatient use)     Chief Complaint  Patient presents with  . Acute Visit    Diarrhea    HPI: Patient is a 86 y.o. female seen today for an acute visit for Blood in stool, Weakness and Dysuria  Patient has a history of diastolic CHF, hyponatremia, hypertension, diabetes mellitus, PSVT and history of breast cancer and lung cancer,.Iron Def Anemiaon Oxygen PRN,Left Sided Pleural Effusion  Admitted in hospital in 8/10-8/13 for Hyponatremia Treated with Fluid restriction and Salt tablets Today was noticed to have  GI bleed Noticed little yesterday but this morning had large Bloody stool with Dark and red blood Has h/o Diverticular Bleed. C/o Abdominal Discomfort Dysuria C/o Dyuria Frequency No Fever Weakness Feeling very weak and tired Right Hip Pain Has been better on 1 week of Meloxicam.  Wants her Oxycodone to be reduced Depression want every Med stopped. Wants to be comfortable. Wants Palliative involved.  Past Medical History:  Diagnosis Date  . Allergic rhinitis   . Anemia    NOS  . Anxiety   . Biceps tendon  rupture    Bilateral  . Breast cancer ([facility]) 2001   Left s/p lumpectomy and XRT   . Bronchiectasis ([facility]) 2014   Noted on chest x-ray  . Complication of anesthesia    slow to wake up  . Cough 2014  . Depression   . Diverticulosis   . Elevated liver enzymes    Biopsy consistent with steatohepatitis  . Fundic gland polyps of stomach, benign   . GERD (gastroesophageal reflux disease) 06/2007   EGD Dr Gala Romney >sm HH, multiple fundic gland polyps  . Glaucoma 2013   both eyes  . Hemorrhoids   . Hiatal hernia   . Hx of radiation therapy 07/17/10 to 07/21/10   SRS LLL lung  . Hyperlipidemia   . Insomnia   . Low back pain    scoliosis  . Lung cancer ([facility]) 05/2010   Left 2011 - rad x 3  . Lymphedema    Left arm  . Osteoarthritis   . Osteopenia 10/25/2016  . Osteoporosis, senile   . Overactive bladder   . Pneumonia    RLL with sepsis  . PSVT (paroxysmal supraventricular tachycardia) ([facility])   . Rheumatic fever    Age 71  . Right thyroid nodule   . Steatohepatitis    liver bx  . Type 2 diabetes mellitus ([place])     Past Surgical History:  Procedure Laterality Date  . ABDOMINAL HYSTERECTOMY  1964  . APPENDECTOMY  1964  . BIOPSY THYROID  11/2008  . BREAST BIOPSY     X 3 w/ cystectomy  .  BREAST LUMPECTOMY  2001  . CATARACT EXTRACTION Bilateral 2003   Lens implant Dr. Charise Killian  . CHOLECYSTECTOMY  1965  . COLONOSCOPY  09/11/2011   Procedure: COLONOSCOPY;  Surgeon: Dorothyann Peng, MD;  [facility];  Service: Endoscopy;  Laterality: N/A;  . COLONOSCOPY  2005 /2012   Dr. Lucianne Muss- diverticulosis, ext hemorrhoids.  . COLONOSCOPY WITH PROPOFOL N/A 11/09/2017   Procedure: COLONOSCOPY WITH PROPOFOL;  Surgeon: Mauri Pole, MD;  Location: [facility];  Service: Endoscopy;  Laterality: N/A;  . COLONOSCOPY WITH PROPOFOL N/A 11/12/2017   Procedure: COLONOSCOPY WITH PROPOFOL;  Surgeon: Ladene Artist, MD;  Location: [facility];  Service: Endoscopy;  Laterality: N/A;  . CYSTOSCOPY   2007  . ESOPHAGOGASTRODUODENOSCOPY  09/11/2011   Procedure: ESOPHAGOGASTRODUODENOSCOPY (EGD);  Surgeon: Dorothyann Peng, MD;  [facility];  Service: Endoscopy;  Laterality: N/A;  . ESOPHAGOGASTRODUODENOSCOPY (EGD) WITH PROPOFOL N/A 11/08/2017   Procedure: ESOPHAGOGASTRODUODENOSCOPY (EGD) WITH PROPOFOL;  Surgeon: Mauri Pole, MD;  Location[facility];  Service: Endoscopy;  Laterality: N/A;  . LIVER BIOPSY  2008  . LUNG BIOPSY Left 06/06/2010  . REVISION TOTAL HIP ARTHROPLASTY  2007   Left  . SKIN CANCER EXCISION     nose and left lower cheek  . TONSILLECTOMY  1930  . TOTAL HIP ARTHROPLASTY Bilateral 2005 & 2007    Dr. Earl Lagos. Aline Brochure     Allergies  Allergen Reactions  . Augmentin [Amoxicillin-Pot Clavulanate] Other (See Comments)    unknown  . Ibuprofen Hives  . Naproxen Other (See Comments)    Unknown Tears stomach up  . Nsaids     Tears stomach up  . Statins Other (See Comments)    Feels like knives in stomach  . Surgilube [Gyne-Moistrin]     Rectal itching, burning  . Tape Rash    Outpatient Encounter Medications as of 08/23/2020  Medication Sig  . acetaminophen (TYLENOL) 500 MG tablet Take 650 mg by mouth in the morning, at noon, in the evening, and at bedtime.   Marland Kitchen atenolol (TENORMIN) 25 MG tablet Take 1 tablet (25 mg total) by mouth daily.  . diclofenac Sodium (VOLTAREN) 1 % GEL Apply 2 g topically 4 (four) times daily as needed.  . DULoxetine (CYMBALTA) 30 MG capsule Take 1 capsule (30 mg total) by mouth daily.  . Estradiol (IMVEXXY MAINTENANCE PACK) 4 MCG INST Place 4 mcg vaginally once a week. On Thursday  . ferrous sulfate 220 (44 Fe) MG/5ML solution Take 220 mg by mouth daily. 5 mL by mouth daily with meal Once A Day  . gabapentin (NEURONTIN) 100 MG capsule Take 1 capsule (100 mg total) by mouth 2 (two) times daily.  Marland Kitchen latanoprost (XALATAN) 0.005 % ophthalmic solution Place 1 drop into both eyes at bedtime.  Marland Kitchen loperamide (IMODIUM A-D) 2 MG  tablet Take 4 mg by mouth 4 (four) times daily as needed for diarrhea or loose stools.  . Multiple Vitamins-Minerals (PRESERVISION AREDS 2) CAPS Take 1 capsule by mouth 2 (two) times daily.  . mupirocin ointment (BACTROBAN) 2 % Apply 1 application topically daily as needed (nasal sores).   Marland Kitchen omeprazole (PRILOSEC) 20 MG capsule Take 1 capsule (20 mg total) by mouth daily. (Patient taking differently: Take 20 mg by mouth 2 (two) times daily before a meal. )  . oxyCODONE (OXY IR/ROXICODONE) 5 MG immediate release tablet Take 0.5 tablets (2.5 mg total) by mouth every 6 (six) hours. (Patient taking differently: Take 2.5 mg by mouth  in the morning and at bedtime. )  . simethicone (MYLICON) 80 MG chewable tablet Chew 80 mg by mouth 4 (four) times daily as needed for flatulence.  . sodium chloride 1 g tablet Take 1 g by mouth 2 (two) times daily with a meal.   . [DISCONTINUED] senna-docusate (SENOKOT S) 8.6-50 MG tablet Take 1 tablet by mouth daily.  . [DISCONTINUED] B Complex Vitamins (B COMPLEX-B12) TABS Take 1 tablet by mouth daily.  . [DISCONTINUED] folic acid (FOLVITE) 620 MCG tablet Take 1 tablet (400 mcg total) by mouth daily.  . [DISCONTINUED] magnesium oxide (MAG-OX) 400 (241.3 Mg) MG tablet Take 1 tablet (400 mg total) by mouth 2 (two) times daily.  . [DISCONTINUED] polyethylene glycol (MIRALAX / GLYCOLAX) 17 g packet Take 17 g by mouth daily.   No facility-administered encounter medications on file as of 08/23/2020.    Review of Systems:  Review of Systems  Constitutional: Positive for appetite change.  HENT: Negative.   Respiratory: Negative.   Cardiovascular: Negative.   Gastrointestinal: Positive for abdominal pain, blood in stool and diarrhea.  Genitourinary: Positive for dysuria, frequency and urgency.  Musculoskeletal: Positive for arthralgias, gait problem and myalgias.  Skin: Negative.   Neurological: Positive for weakness.  Psychiatric/Behavioral: Positive for dysphoric mood.     Health Maintenance  Topic Date Due  . FOOT EXAM  Never done  . URINE MICROALBUMIN  04/30/2008  . OPHTHALMOLOGY EXAM  12/27/2019  . INFLUENZA VACCINE  07/17/2020  . TETANUS/TDAP  10/13/2029 (Originally 12/18/2015)  . HEMOGLOBIN A1C  11/17/2020  . DEXA SCAN  Completed  . COVID-19 Vaccine  Completed  . PNA vac Low Risk Adult  Completed    Physical Exam: Vitals:   08/23/20 1515  BP: 140/70  Pulse: 70  Resp: 20  Temp: (!) 97.2 F (36.2 C)  SpO2: 90%  Weight: 142 lb (64.4 kg)  Height: 5' 4"  (1.626 m)   Body mass index is 24.37 kg/m. Physical Exam Vitals reviewed.  Constitutional:      Comments: Very Frail and Pale  HENT:     Head: Normocephalic.     Nose: Nose normal.     Mouth/Throat:     Mouth: Mucous membranes are moist.     Pharynx: Oropharynx is clear.  Eyes:     Pupils: Pupils are equal, round, and reactive to light.  Cardiovascular:     Rate and Rhythm: Normal rate.     Pulses: Normal pulses.  Pulmonary:     Effort: Pulmonary effort is normal. No respiratory distress.     Breath sounds: Normal breath sounds. No wheezing or rales.  Abdominal:     General: Abdomen is flat. Bowel sounds are normal.     Palpations: Abdomen is soft.  Musculoskeletal:        General: No swelling.     Cervical back: Neck supple.  Skin:    General: Skin is warm.     Coloration: Skin is pale.  Neurological:     General: No focal deficit present.     Mental Status: She is alert and oriented to person, place, and time.  Psychiatric:        Mood and Affect: Mood normal.        Thought Content: Thought content normal.     Comments: Crying and says wants to be comfortable.      Labs reviewed: Basic Metabolic Panel: Recent Labs    06/21/20 0000 07/19/20 0000 07/26/20 1953 07/26/20 1953 07/27/20 3559 07/27/20 7416  07/28/20 0436 07/29/20 0422 08/10/20 0000  NA 133*   < > 126*   < > 127*   < > 131* 130* 130*  K 5.0   < > 4.6   < > 4.0   < > 4.0 3.9 4.4  CL 98*   < >  89*   < > 93*   < > 96* 96* 96*  CO2 30*   < > 29   < > 25   < > 26 26 24*  GLUCOSE  --    < > 93   < > 98  --  94 94  --   BUN 24*   < > 13   < > 14   < > 15 17 25*  CREATININE 0.9   < > 0.81   < > 0.67   < > 0.79 0.78 0.8  CALCIUM 8.7   < > 9.2   < > 8.6*   < > 8.9 9.1 9.0  MG 1.8  --   --   --   --   --  1.9 1.8  --   PHOS  --   --   --   --   --   --  3.2 3.5  --   TSH  --   --  2.338  --   --   --   --   --   --    < > = values in this interval not displayed.   Liver Function Tests: Recent Labs    07/27/20 0433 07/27/20 0433 07/28/20 0436 07/29/20 0422 08/10/20 0000  AST 34   < > 27 24 22   ALT 26   < > 24 22 16   ALKPHOS 89   < > 83 77 101  BILITOT 0.5  --  0.7 0.8  --   PROT 6.8  --  6.7 6.8  --   ALBUMIN 3.1*   < > 3.2* 3.1* 3.5   < > = values in this interval not displayed.   No results for input(s): LIPASE, AMYLASE in the last 8760 hours. No results for input(s): AMMONIA in the last 8760 hours. CBC: Recent Labs    07/27/20 0433 07/27/20 0433 07/28/20 0436 07/29/20 0422 08/10/20 0000  WBC 7.0   < > 7.4 8.0 6.7  NEUTROABS  --   --  5.1 5.8 4,576  HGB 11.7*   < > 12.2 11.5* 10.8*  HCT 36.1   < > 38.0 34.9* 33*  MCV 99.2  --  98.4 96.7  --   PLT 262   < > 287 284 254   < > = values in this interval not displayed.   Lipid Panel: No results for input(s): CHOL, HDL, LDLCALC, TRIG, CHOLHDL, LDLDIRECT in the last 8760 hours. Lab Results  Component Value Date   HGBA1C 5.3 05/18/2020    Procedures since last visit: MR Hip Right w/o contrast  Result Date: 08/09/2020 CLINICAL DATA:  Right hip pain for 1 month. History of prior hip replacement. EXAM: MR OF THE RIGHT HIP WITHOUT CONTRAST TECHNIQUE: Multiplanar, multisequence MR imaging was performed. No intravenous contrast was administered. COMPARISON:  Plain films of the hips 07/11/2020. FINDINGS: Bones/Joint/Cartilage Despite using artifact reduction techniques in this patient with bilateral hip arthroplasties,  there is artifact from the study somewhat limiting the exam. No fracture, stress change or worrisome lesion is identified. There is no evidence of hardware loosening. No bony destructive change about either hip. No joint effusion.  No mass or fluid collection is seen about either hip. Ligaments Visualization is limited but no obvious abnormality is seen. Muscles and Tendons Intact and unremarkable. Soft tissues Imaged intrapelvic contents demonstrate sigmoid diverticulosis. The patient appears to be status post hysterectomy. IMPRESSION: No acute abnormality or finding to explain the patient's symptoms. No complicating feature related to the patient's right hip replacement. Diverticulosis. Electronically Signed   By: Inge Rise M.D.   On: 08/09/2020 15:19   US Guided Needle Placement  Result Date: 07/25/2020 Limited diagnostic ultrasound of the anterior hip reveals no obvious joint effusion.  I believe the iliopsoas tendon is intact.  DG CHEST PORT 1 VIEW  Result Date: 07/27/2020 CLINICAL DATA:  Shortness of breath. EXAM: PORTABLE CHEST 1 VIEW COMPARISON:  02/25/2019 FINDINGS: The heart is within normal limits in size given the AP projection and portable technique. Mild tortuosity and calcification of the thoracic aorta. Chronic or recurrent left pleural effusion and left lower lobe atelectasis. Minimal streaky right basilar atelectasis. No pulmonary edema or worrisome pulmonary lesions. The bony thorax is intact IMPRESSION: Chronic or recurrent left pleural effusion and left lower lobe atelectasis. Electronically Signed   By: Marijo Sanes M.D.   On: 07/27/2020 05:45   DG Toe Great Right  Result Date: 07/27/2020 CLINICAL DATA:  Rolled over toe EXAM: RIGHT GREAT TOE COMPARISON:  None. FINDINGS: Osteopenia. No acute fracture or dislocation. Joint spaces and alignment are maintained for age. No area of erosion or osseous destruction. No unexpected radiopaque foreign body. Soft tissues are unremarkable.  IMPRESSION: No acute fracture or dislocation. Electronically Signed   By: Valentino Saxon MD   On: 07/27/2020 11:30    Assessment/Plan  Acute lower GI bleeding Has h/o Diverticular Bleed D/W Patient does not want to go to ED right now Wants to be comfortable Stat CBC Transfer to [facility] for better Care Addendum CBC came back with Hgb of 7.8 Drop from 10.8 Most Likely Diverticular Bleed Increase the Prilosec to BID Continue on Iron If bleed does not stop and patient refuses ED then consider Hospice Repeat CBC in Am Dysuria Send Urine for Culture  Right hip pain Pain Better on Meloxicam But will discontinue it due to Bleed Wants Oxycodone decrased to BID per her request Continue on Neurontin Continue Tylenol Hyponatremia Sodium today was 133 Bun Elevated 30 Creat 0.78 Neuropathy Neurontin PSVT On Tenormin Left Pleural Effusion Has been stable for past few months Depression with anxiety Continue Cymbalta for now Palliative consult ACP Discussed in detail with the patient son Patient does not want to go to the emergency room.  She says she does not want any more treatment.  That son is worried that she is depressed and this is a acute reaction.  We will get the palliative care consult.    Labs/tests ordered: CBC Total time spent in this patient care encounter was  60_  minutes; greater than 50% of the visit spent counseling patient and staff, reviewing records , Labs and coordinating care for problems addressed at this encounter.

## 2020-08-24 ENCOUNTER — Inpatient Hospital Stay (HOSPITAL_COMMUNITY)
Admission: EM | Admit: 2020-08-24 | Discharge: 2020-08-27 | DRG: 378 | Disposition: A | Discharge status: Skilled Nursing Facility | Payer: Medicare HMO | Source: Skilled Nursing Facility | Attending: Family Medicine | Admitting: Family Medicine

## 2020-08-24 ENCOUNTER — Encounter (HOSPITAL_COMMUNITY): Payer: Self-pay | Admitting: Emergency Medicine

## 2020-08-24 ENCOUNTER — Inpatient Hospital Stay (HOSPITAL_COMMUNITY): Payer: Medicare HMO

## 2020-08-24 ENCOUNTER — Encounter: Payer: Self-pay | Admitting: Nurse Practitioner

## 2020-08-24 ENCOUNTER — Non-Acute Institutional Stay (SKILLED_NURSING_FACILITY): Payer: Medicare HMO | Admitting: Nurse Practitioner

## 2020-08-24 ENCOUNTER — Other Ambulatory Visit: Payer: Self-pay

## 2020-08-24 DIAGNOSIS — Z85118 Personal history of other malignant neoplasm of bronchus and lung: Secondary | ICD-10-CM

## 2020-08-24 DIAGNOSIS — D649 Anemia, unspecified: Secondary | ICD-10-CM | POA: Diagnosis not present

## 2020-08-24 DIAGNOSIS — E222 Syndrome of inappropriate secretion of antidiuretic hormone: Secondary | ICD-10-CM | POA: Diagnosis present

## 2020-08-24 DIAGNOSIS — Z888 Allergy status to other drugs, medicaments and biological substances status: Secondary | ICD-10-CM

## 2020-08-24 DIAGNOSIS — M810 Age-related osteoporosis without current pathological fracture: Secondary | ICD-10-CM | POA: Diagnosis present

## 2020-08-24 DIAGNOSIS — Z6825 Body mass index (BMI) 25.0-25.9, adult: Secondary | ICD-10-CM

## 2020-08-24 DIAGNOSIS — K219 Gastro-esophageal reflux disease without esophagitis: Secondary | ICD-10-CM | POA: Diagnosis present

## 2020-08-24 DIAGNOSIS — K644 Residual hemorrhoidal skin tags: Secondary | ICD-10-CM | POA: Diagnosis present

## 2020-08-24 DIAGNOSIS — F418 Other specified anxiety disorders: Secondary | ICD-10-CM

## 2020-08-24 DIAGNOSIS — E119 Type 2 diabetes mellitus without complications: Secondary | ICD-10-CM | POA: Diagnosis present

## 2020-08-24 DIAGNOSIS — F419 Anxiety disorder, unspecified: Secondary | ICD-10-CM | POA: Diagnosis present

## 2020-08-24 DIAGNOSIS — Z923 Personal history of irradiation: Secondary | ICD-10-CM

## 2020-08-24 DIAGNOSIS — R531 Weakness: Secondary | ICD-10-CM | POA: Diagnosis not present

## 2020-08-24 DIAGNOSIS — K5731 Diverticulosis of large intestine without perforation or abscess with bleeding: Secondary | ICD-10-CM | POA: Diagnosis not present

## 2020-08-24 DIAGNOSIS — I471 Supraventricular tachycardia, unspecified: Secondary | ICD-10-CM | POA: Diagnosis present

## 2020-08-24 DIAGNOSIS — J90 Pleural effusion, not elsewhere classified: Secondary | ICD-10-CM | POA: Diagnosis present

## 2020-08-24 DIAGNOSIS — G4700 Insomnia, unspecified: Secondary | ICD-10-CM | POA: Diagnosis present

## 2020-08-24 DIAGNOSIS — R4182 Altered mental status, unspecified: Secondary | ICD-10-CM | POA: Diagnosis not present

## 2020-08-24 DIAGNOSIS — K3189 Other diseases of stomach and duodenum: Secondary | ICD-10-CM | POA: Diagnosis not present

## 2020-08-24 DIAGNOSIS — J309 Allergic rhinitis, unspecified: Secondary | ICD-10-CM | POA: Diagnosis present

## 2020-08-24 DIAGNOSIS — E46 Unspecified protein-calorie malnutrition: Secondary | ICD-10-CM | POA: Diagnosis not present

## 2020-08-24 DIAGNOSIS — K921 Melena: Secondary | ICD-10-CM | POA: Diagnosis not present

## 2020-08-24 DIAGNOSIS — Z9049 Acquired absence of other specified parts of digestive tract: Secondary | ICD-10-CM

## 2020-08-24 DIAGNOSIS — Z961 Presence of intraocular lens: Secondary | ICD-10-CM | POA: Diagnosis present

## 2020-08-24 DIAGNOSIS — F329 Major depressive disorder, single episode, unspecified: Secondary | ICD-10-CM | POA: Diagnosis present

## 2020-08-24 DIAGNOSIS — Z20822 Contact with and (suspected) exposure to covid-19: Secondary | ICD-10-CM | POA: Diagnosis present

## 2020-08-24 DIAGNOSIS — K625 Hemorrhage of anus and rectum: Secondary | ICD-10-CM | POA: Diagnosis not present

## 2020-08-24 DIAGNOSIS — D62 Acute posthemorrhagic anemia: Secondary | ICD-10-CM | POA: Diagnosis not present

## 2020-08-24 DIAGNOSIS — E871 Hypo-osmolality and hyponatremia: Secondary | ICD-10-CM | POA: Diagnosis not present

## 2020-08-24 DIAGNOSIS — Z9842 Cataract extraction status, left eye: Secondary | ICD-10-CM | POA: Diagnosis not present

## 2020-08-24 DIAGNOSIS — M25551 Pain in right hip: Secondary | ICD-10-CM | POA: Diagnosis not present

## 2020-08-24 DIAGNOSIS — D500 Iron deficiency anemia secondary to blood loss (chronic): Secondary | ICD-10-CM | POA: Diagnosis not present

## 2020-08-24 DIAGNOSIS — K5730 Diverticulosis of large intestine without perforation or abscess without bleeding: Secondary | ICD-10-CM | POA: Diagnosis not present

## 2020-08-24 DIAGNOSIS — D72829 Elevated white blood cell count, unspecified: Secondary | ICD-10-CM | POA: Diagnosis present

## 2020-08-24 DIAGNOSIS — Z66 Do not resuscitate: Secondary | ICD-10-CM | POA: Diagnosis present

## 2020-08-24 DIAGNOSIS — I959 Hypotension, unspecified: Secondary | ICD-10-CM | POA: Diagnosis not present

## 2020-08-24 DIAGNOSIS — Z8262 Family history of osteoporosis: Secondary | ICD-10-CM

## 2020-08-24 DIAGNOSIS — R69 Illness, unspecified: Secondary | ICD-10-CM | POA: Diagnosis not present

## 2020-08-24 DIAGNOSIS — J9811 Atelectasis: Secondary | ICD-10-CM | POA: Diagnosis not present

## 2020-08-24 DIAGNOSIS — E785 Hyperlipidemia, unspecified: Secondary | ICD-10-CM | POA: Diagnosis present

## 2020-08-24 DIAGNOSIS — E440 Moderate protein-calorie malnutrition: Secondary | ICD-10-CM | POA: Diagnosis not present

## 2020-08-24 DIAGNOSIS — I5030 Unspecified diastolic (congestive) heart failure: Secondary | ICD-10-CM | POA: Diagnosis present

## 2020-08-24 DIAGNOSIS — Z9841 Cataract extraction status, right eye: Secondary | ICD-10-CM

## 2020-08-24 DIAGNOSIS — R58 Hemorrhage, not elsewhere classified: Secondary | ICD-10-CM | POA: Diagnosis not present

## 2020-08-24 DIAGNOSIS — Z806 Family history of leukemia: Secondary | ICD-10-CM

## 2020-08-24 DIAGNOSIS — Z8249 Family history of ischemic heart disease and other diseases of the circulatory system: Secondary | ICD-10-CM

## 2020-08-24 DIAGNOSIS — M2550 Pain in unspecified joint: Secondary | ICD-10-CM | POA: Diagnosis not present

## 2020-08-24 DIAGNOSIS — J811 Chronic pulmonary edema: Secondary | ICD-10-CM | POA: Diagnosis not present

## 2020-08-24 DIAGNOSIS — Z85828 Personal history of other malignant neoplasm of skin: Secondary | ICD-10-CM | POA: Diagnosis not present

## 2020-08-24 DIAGNOSIS — Z79899 Other long term (current) drug therapy: Secondary | ICD-10-CM

## 2020-08-24 DIAGNOSIS — Z853 Personal history of malignant neoplasm of breast: Secondary | ICD-10-CM | POA: Diagnosis not present

## 2020-08-24 DIAGNOSIS — G629 Polyneuropathy, unspecified: Secondary | ICD-10-CM | POA: Diagnosis present

## 2020-08-24 DIAGNOSIS — Z515 Encounter for palliative care: Secondary | ICD-10-CM

## 2020-08-24 DIAGNOSIS — Z823 Family history of stroke: Secondary | ICD-10-CM

## 2020-08-24 DIAGNOSIS — Z91048 Other nonmedicinal substance allergy status: Secondary | ICD-10-CM

## 2020-08-24 DIAGNOSIS — M419 Scoliosis, unspecified: Secondary | ICD-10-CM | POA: Diagnosis present

## 2020-08-24 DIAGNOSIS — G8929 Other chronic pain: Secondary | ICD-10-CM | POA: Diagnosis present

## 2020-08-24 DIAGNOSIS — Z7401 Bed confinement status: Secondary | ICD-10-CM | POA: Diagnosis not present

## 2020-08-24 DIAGNOSIS — R799 Abnormal finding of blood chemistry, unspecified: Secondary | ICD-10-CM | POA: Diagnosis not present

## 2020-08-24 DIAGNOSIS — K449 Diaphragmatic hernia without obstruction or gangrene: Secondary | ICD-10-CM | POA: Diagnosis present

## 2020-08-24 DIAGNOSIS — Z96643 Presence of artificial hip joint, bilateral: Secondary | ICD-10-CM | POA: Diagnosis present

## 2020-08-24 DIAGNOSIS — K922 Gastrointestinal hemorrhage, unspecified: Secondary | ICD-10-CM | POA: Diagnosis present

## 2020-08-24 DIAGNOSIS — K429 Umbilical hernia without obstruction or gangrene: Secondary | ICD-10-CM | POA: Diagnosis not present

## 2020-08-24 DIAGNOSIS — R682 Dry mouth, unspecified: Secondary | ICD-10-CM | POA: Diagnosis not present

## 2020-08-24 DIAGNOSIS — Z886 Allergy status to analgesic agent status: Secondary | ICD-10-CM

## 2020-08-24 DIAGNOSIS — Z881 Allergy status to other antibiotic agents status: Secondary | ICD-10-CM

## 2020-08-24 DIAGNOSIS — Z7189 Other specified counseling: Secondary | ICD-10-CM | POA: Diagnosis not present

## 2020-08-24 DIAGNOSIS — S32592A Other specified fracture of left pubis, initial encounter for closed fracture: Secondary | ICD-10-CM | POA: Diagnosis not present

## 2020-08-24 LAB — CBC WITH DIFFERENTIAL/PLATELET
Abs Immature Granulocytes: 0.1 10*3/uL — ABNORMAL HIGH (ref 0.00–0.07)
Basophils Absolute: 0 10*3/uL (ref 0.0–0.1)
Basophils Relative: 0 %
Eosinophils Absolute: 0.1 10*3/uL (ref 0.0–0.5)
Eosinophils Relative: 0 %
HCT: 17.6 % — ABNORMAL LOW (ref 36.0–46.0)
Hemoglobin: 5.8 g/dL — CL (ref 12.0–15.0)
Immature Granulocytes: 1 %
Lymphocytes Relative: 10 %
Lymphs Abs: 1.1 10*3/uL (ref 0.7–4.0)
MCH: 32.8 pg (ref 26.0–34.0)
MCHC: 33 g/dL (ref 30.0–36.0)
MCV: 99.4 fL (ref 80.0–100.0)
Monocytes Absolute: 1.1 10*3/uL — ABNORMAL HIGH (ref 0.1–1.0)
Monocytes Relative: 10 %
Neutro Abs: 8.9 10*3/uL — ABNORMAL HIGH (ref 1.7–7.7)
Neutrophils Relative %: 79 %
Platelets: 211 10*3/uL (ref 150–400)
RBC: 1.77 MIL/uL — ABNORMAL LOW (ref 3.87–5.11)
RDW: 15.6 % — ABNORMAL HIGH (ref 11.5–15.5)
WBC: 11.2 10*3/uL — ABNORMAL HIGH (ref 4.0–10.5)
nRBC: 0 % (ref 0.0–0.2)

## 2020-08-24 LAB — COMPREHENSIVE METABOLIC PANEL
ALT: 18 U/L (ref 0–44)
AST: 20 U/L (ref 15–41)
Albumin: 2.9 g/dL — ABNORMAL LOW (ref 3.5–5.0)
Alkaline Phosphatase: 98 U/L (ref 38–126)
Anion gap: 10 (ref 5–15)
BUN: 46 mg/dL — ABNORMAL HIGH (ref 8–23)
CO2: 23 mmol/L (ref 22–32)
Calcium: 8.4 mg/dL — ABNORMAL LOW (ref 8.9–10.3)
Chloride: 97 mmol/L — ABNORMAL LOW (ref 98–111)
Creatinine, Ser: 1.08 mg/dL — ABNORMAL HIGH (ref 0.44–1.00)
GFR calc Af Amer: 50 mL/min — ABNORMAL LOW (ref >60)
GFR calc non Af Amer: 43 mL/min — ABNORMAL LOW (ref >60)
Glucose, Bld: 113 mg/dL — ABNORMAL HIGH (ref 70–99)
Potassium: 4.7 mmol/L (ref 3.5–5.1)
Sodium: 130 mmol/L — ABNORMAL LOW (ref 135–145)
Total Bilirubin: 0.4 mg/dL (ref 0.3–1.2)
Total Protein: 6 g/dL — ABNORMAL LOW (ref 6.5–8.1)

## 2020-08-24 LAB — APTT: aPTT: 29 seconds (ref 24–36)

## 2020-08-24 LAB — PREPARE RBC (CROSSMATCH)

## 2020-08-24 LAB — POC OCCULT BLOOD, ED: Fecal Occult Bld: POSITIVE — AB

## 2020-08-24 LAB — PROTIME-INR
INR: 1 (ref 0.8–1.2)
Prothrombin Time: 12.7 seconds (ref 11.4–15.2)

## 2020-08-24 LAB — SARS CORONAVIRUS 2 BY RT PCR (HOSPITAL ORDER, PERFORMED IN ~~LOC~~ HOSPITAL LAB): SARS Coronavirus 2: NEGATIVE

## 2020-08-24 LAB — PREALBUMIN: Prealbumin: 10.4 mg/dL — ABNORMAL LOW (ref 18–38)

## 2020-08-24 MED ORDER — PANTOPRAZOLE SODIUM 40 MG IV SOLR: 40.0000 mg | Freq: Two times a day (BID) | INTRAVENOUS | Status: DC

## 2020-08-24 MED ORDER — DICLOFENAC SODIUM 1 % EX GEL
2.0000 g | Freq: Four times a day (QID) | CUTANEOUS | Status: DC | PRN
  Filled 2020-08-24: qty 100

## 2020-08-24 MED ORDER — ACETAMINOPHEN 325 MG PO TABS: 650.0000 mg | ORAL_TABLET | Freq: Four times a day (QID) | ORAL | Status: DC | PRN

## 2020-08-24 MED ORDER — ATENOLOL 25 MG PO TABS
25.0000 mg | ORAL_TABLET | Freq: Every day | ORAL | Status: DC
  Administered 2020-08-25 – 2020-08-26 (×2): 25 mg via ORAL
  Filled 2020-08-24 (×3): qty 1

## 2020-08-24 MED ORDER — SODIUM CHLORIDE 0.9 % IV SOLN
8.0000 mg/h | INTRAVENOUS | Status: DC
  Administered 2020-08-24: 8 mg/h via INTRAVENOUS
  Filled 2020-08-24 (×2): qty 80

## 2020-08-24 MED ORDER — FAMOTIDINE IN NACL 20-0.9 MG/50ML-% IV SOLN
20.0000 mg | Freq: Once | INTRAVENOUS | Status: AC
  Administered 2020-08-24: 20 mg via INTRAVENOUS
  Filled 2020-08-24: qty 50

## 2020-08-24 MED ORDER — DULOXETINE HCL 30 MG PO CPEP
30.0000 mg | ORAL_CAPSULE | Freq: Every day | ORAL | Status: DC
  Administered 2020-08-25: 30 mg via ORAL
  Filled 2020-08-24: qty 1

## 2020-08-24 MED ORDER — IOHEXOL 350 MG/ML SOLN
100.0000 mL | Freq: Once | INTRAVENOUS | Status: AC | PRN
  Administered 2020-08-24: 80 mL via INTRAVENOUS

## 2020-08-24 MED ORDER — ONDANSETRON HCL 4 MG/2ML IJ SOLN
4.0000 mg | Freq: Four times a day (QID) | INTRAMUSCULAR | Status: DC | PRN
  Administered 2020-08-26: 4 mg via INTRAVENOUS

## 2020-08-24 MED ORDER — SODIUM CHLORIDE 0.9 % IV SOLN: 10.0000 mL/h | Freq: Once | INTRAVENOUS | Status: DC

## 2020-08-24 MED ORDER — FERROUS SULFATE 300 (60 FE) MG/5ML PO SYRP
300.0000 mg | ORAL_SOLUTION | Freq: Every day | ORAL | Status: DC
  Administered 2020-08-25: 300 mg via ORAL
  Filled 2020-08-24: qty 5

## 2020-08-24 MED ORDER — ACETAMINOPHEN 325 MG PO TABS
650.0000 mg | ORAL_TABLET | Freq: Three times a day (TID) | ORAL | Status: DC
  Administered 2020-08-24 – 2020-08-26 (×5): 650 mg via ORAL
  Filled 2020-08-24 (×7): qty 2

## 2020-08-24 MED ORDER — ALBUTEROL SULFATE (2.5 MG/3ML) 0.083% IN NEBU
2.5000 mg | INHALATION_SOLUTION | Freq: Four times a day (QID) | RESPIRATORY_TRACT | Status: DC | PRN
  Administered 2020-08-25: 2.5 mg via RESPIRATORY_TRACT
  Filled 2020-08-24: qty 3

## 2020-08-24 MED ORDER — ACETAMINOPHEN 650 MG RE SUPP: 650.0000 mg | Freq: Four times a day (QID) | RECTAL | Status: DC | PRN

## 2020-08-24 MED ORDER — GABAPENTIN 100 MG PO CAPS
100.0000 mg | ORAL_CAPSULE | Freq: Two times a day (BID) | ORAL | Status: DC
  Administered 2020-08-24 – 2020-08-25 (×2): 100 mg via ORAL
  Filled 2020-08-24 (×2): qty 1

## 2020-08-24 MED ORDER — ONDANSETRON HCL 4 MG PO TABS: 4.0000 mg | ORAL_TABLET | Freq: Four times a day (QID) | ORAL | Status: DC | PRN

## 2020-08-24 MED ORDER — OXYCODONE HCL 5 MG PO TABS
2.5000 mg | ORAL_TABLET | Freq: Two times a day (BID) | ORAL | Status: DC
  Administered 2020-08-24 – 2020-08-25 (×2): 2.5 mg via ORAL
  Filled 2020-08-24 (×2): qty 1

## 2020-08-24 MED ORDER — SODIUM CHLORIDE 1 G PO TABS
1.0000 g | ORAL_TABLET | Freq: Two times a day (BID) | ORAL | Status: DC
  Administered 2020-08-24 – 2020-08-26 (×5): 1 g via ORAL
  Filled 2020-08-24 (×7): qty 1

## 2020-08-24 MED ORDER — SIMETHICONE 80 MG PO CHEW
80.0000 mg | CHEWABLE_TABLET | Freq: Four times a day (QID) | ORAL | Status: DC | PRN
  Filled 2020-08-24: qty 1

## 2020-08-24 NOTE — Consult Note (Signed)
Referring Provider:  Dr. Ova Freshwater, EDP Primary Care Physician:  Virgie Dad, MD Primary Gastroenterologist:  Dr. Silverio Decamp  Reason for Consultation:  GI bleed  HPI: [name] is a 86 y.o. female who has past medical history as listed below.  She has history of GI bleeding due to diverticulosis and was hospitalized for this in November 2018 and received 7 or 8 units of PRBC's.  Had colonoscopy x 2 with hemoclips placed at a diverticulum in the transverse colon.    EGD 10/2017 was normal.  She presented to [facility] hospital today for complaints of GI bleeding and anemia/drop in Hgb by labs at her PCPs office.  She says that she started having some red blood about 4-5 days ago but it tapered off and resolved before starting back up again last night.  Describes it as a "right much amount of blood" that is dark red in color.  Hgb two weeks ago was 10.8 grams.  Apparently it was 7.8 grams at PCPs office yesterday and then 5.6 grams today.  She was sent to the ED.  She has been hemodynamically stable.  BUN is elevated at 46.  Her son tells [place] that she was given about a weeks worth of meloxicam recently for severe hip pain.  No other NSAIDs or blood thinners.  She denies abdominal pain, just some gurgling discomfort.  No nausea or vomiting.  They say that she has been battling issues with alternating constipation and diarrhea recently.  Had been on some pain medication/narcotics for her hip pain for some short courses as well.  Past Medical History:  Diagnosis Date  . Allergic rhinitis   . Anemia    NOS  . Anxiety   . Biceps tendon rupture    Bilateral  . Breast cancer ([place]) 2001   Left s/p lumpectomy and XRT   . Bronchiectasis ([place]) 2014   Noted on chest x-ray  . Complication of anesthesia    slow to wake up  . Cough 2014  . Depression   . Diverticulosis   . Elevated liver enzymes    Biopsy consistent with steatohepatitis  . Fundic gland polyps of stomach, benign   . GERD  (gastroesophageal reflux disease) 06/2007   EGD Dr Gala Romney >sm HH, multiple fundic gland polyps  . Glaucoma 2013   both eyes  . Hemorrhoids   . Hiatal hernia   . Hx of radiation therapy 07/17/10 to 07/21/10   SRS LLL lung  . Hyperlipidemia   . Insomnia   . Low back pain    scoliosis  . Lung cancer ([place]) 05/2010   Left 2011 - rad x 3  . Lymphedema    Left arm  . Osteoarthritis   . Osteopenia 10/25/2016  . Osteoporosis, senile   . Overactive bladder   . Pneumonia    RLL with sepsis  . PSVT (paroxysmal supraventricular tachycardia) ([place])   . Rheumatic fever    Age 63  . Right thyroid nodule   . Steatohepatitis    liver bx  . Type 2 diabetes mellitus ([place])     Past Surgical History:  Procedure Laterality Date  . ABDOMINAL HYSTERECTOMY  1964  . APPENDECTOMY  1964  . BIOPSY THYROID  11/2008  . BREAST BIOPSY     X 3 w/ cystectomy  . BREAST LUMPECTOMY  2001  . CATARACT EXTRACTION Bilateral 2003   Lens implant Dr. Charise Killian  . CHOLECYSTECTOMY  1965  . COLONOSCOPY  09/11/2011   Procedure:  COLONOSCOPY;  Surgeon: Dorothyann Peng, MD;  Location: [facility];  Service: Endoscopy;  Laterality: N/A;  . COLONOSCOPY  2005 /2012   Dr. Lucianne Muss- diverticulosis, ext hemorrhoids.  . COLONOSCOPY WITH PROPOFOL N/A 11/09/2017   Procedure: COLONOSCOPY WITH PROPOFOL;  Surgeon: Mauri Pole, MD;  Location: [facility];  Service: Endoscopy;  Laterality: N/A;  . COLONOSCOPY WITH PROPOFOL N/A 11/12/2017   Procedure: COLONOSCOPY WITH PROPOFOL;  Surgeon: Ladene Artist, MD;  Location: [facility];  Service: Endoscopy;  Laterality: N/A;  . CYSTOSCOPY  2007  . ESOPHAGOGASTRODUODENOSCOPY  09/11/2011   Procedure: ESOPHAGOGASTRODUODENOSCOPY (EGD);  Surgeon: Dorothyann Peng, MD;  [facility];  Service: Endoscopy;  Laterality: N/A;  . ESOPHAGOGASTRODUODENOSCOPY (EGD) WITH PROPOFOL N/A 11/08/2017   Procedure: ESOPHAGOGASTRODUODENOSCOPY (EGD) WITH PROPOFOL;  Surgeon: Mauri Pole, MD;   Location: [facility];  Service: Endoscopy;  Laterality: N/A;  . LIVER BIOPSY  2008  . LUNG BIOPSY Left 06/06/2010  . REVISION TOTAL HIP ARTHROPLASTY  2007   Left  . SKIN CANCER EXCISION     nose and left lower cheek  . TONSILLECTOMY  1930  . TOTAL HIP ARTHROPLASTY Bilateral 2005 & 2007    Dr. Earl Lagos. Aline Brochure     Prior to Admission medications   Medication Sig Start Date End Date Taking? Authorizing Provider  acetaminophen (TYLENOL) 325 MG tablet Take 650 mg by mouth in the morning, at noon, in the evening, and at bedtime.    Yes [provider]  atenolol (TENORMIN) 25 MG tablet Take 1 tablet (25 mg total) by mouth daily. 03/05/19  Yes [place] Eric J, DO  diclofenac Sodium (VOLTAREN) 1 % GEL Apply 2 g topically 4 (four) times daily as needed. Patient taking differently: Apply 2 g topically 4 (four) times daily as needed (pain).  07/29/20  Yes Elodia Florence., MD  DULoxetine (CYMBALTA) 30 MG capsule Take 1 capsule (30 mg total) by mouth daily. 03/05/19  Yes [place] Eric J, DO  ferrous sulfate 220 (44 Fe) MG/5ML solution Take 220 mg by mouth daily. 5 mL by mouth daily with meal Once A Day   Yes [provider]  gabapentin (NEURONTIN) 100 MG capsule Take 1 capsule (100 mg total) by mouth 2 (two) times daily. 03/05/19  Yes [place] Donnamarie Poag, DO  Multiple Vitamins-Minerals (PRESERVISION AREDS 2) CAPS Take 1 capsule by mouth 2 (two) times daily.   Yes [provider]  mupirocin ointment (BACTROBAN) 2 % Apply 1 application topically daily as needed (nasal sores).  07/22/20  Yes [provider]  omeprazole (PRILOSEC) 20 MG capsule Take 1 capsule (20 mg total) by mouth daily. Patient taking differently: Take 20 mg by mouth 2 (two) times daily before a meal.  03/05/19  Yes [place] Eric J, DO  oxyCODONE (OXY IR/ROXICODONE) 5 MG immediate release tablet Take 0.5 tablets (2.5 mg total) by mouth every 6 (six) hours. Patient taking differently: Take 2.5 mg by  mouth in the morning and at bedtime.  08/08/20  Yes Virgie Dad, MD  simethicone (MYLICON) 80 MG chewable tablet Chew 80 mg by mouth 4 (four) times daily as needed for flatulence.   Yes [provider]  sodium chloride 1 g tablet Take 1 g by mouth 2 (two) times daily with a meal.    Yes [provider]  latanoprost (XALATAN) 0.005 % ophthalmic solution Place 1 drop into both eyes at bedtime. 03/05/19   [place] Eric J, DO    Current  Facility-Administered Medications  Medication Dose Route Frequency Provider Last Rate Last Admin  . 0.9 %  sodium chloride infusion  10 mL/hr Intravenous Once [place], Wylder S, PA      . acetaminophen (TYLENOL) tablet 650 mg  650 mg Oral Q6H PRN Norval Morton, MD       Or  . acetaminophen (TYLENOL) suppository 650 mg  650 mg Rectal Q6H PRN Smith, Rondell A, MD      . albuterol (PROVENTIL) (2.5 MG/3ML) 0.083% nebulizer solution 2.5 mg  2.5 mg Nebulization Q6H PRN Tamala Julian, Rondell A, MD      . ondansetron (ZOFRAN) tablet 4 mg  4 mg Oral Q6H PRN Norval Morton, MD       Or  . ondansetron (ZOFRAN) injection 4 mg  4 mg Intravenous Q6H PRN Smith, Rondell A, MD      . oxyCODONE (Oxy IR/ROXICODONE) immediate release tablet 2.5 mg  2.5 mg Oral BID Smith, Rondell A, MD      . pantoprazole (PROTONIX) 80 mg in sodium chloride 0.9 % 100 mL (0.8 mg/mL) infusion  8 mg/hr Intravenous Continuous Smith, Rondell A, MD      . Derrill Memo ON 08/28/2020] pantoprazole (PROTONIX) injection 40 mg  40 mg Intravenous Q12H Norval Morton, MD       Current Outpatient Medications  Medication Sig Dispense Refill  . acetaminophen (TYLENOL) 325 MG tablet Take 650 mg by mouth in the morning, at noon, in the evening, and at bedtime.     Marland Kitchen atenolol (TENORMIN) 25 MG tablet Take 1 tablet (25 mg total) by mouth daily. 60 tablet 0  . diclofenac Sodium (VOLTAREN) 1 % GEL Apply 2 g topically 4 (four) times daily as needed. (Patient taking differently: Apply 2 g topically 4 (four) times  daily as needed (pain). ) 50 g 0  . DULoxetine (CYMBALTA) 30 MG capsule Take 1 capsule (30 mg total) by mouth daily. 30 capsule 3  . ferrous sulfate 220 (44 Fe) MG/5ML solution Take 220 mg by mouth daily. 5 mL by mouth daily with meal Once A Day    . gabapentin (NEURONTIN) 100 MG capsule Take 1 capsule (100 mg total) by mouth 2 (two) times daily. 60 capsule 0  . Multiple Vitamins-Minerals (PRESERVISION AREDS 2) CAPS Take 1 capsule by mouth 2 (two) times daily.    . mupirocin ointment (BACTROBAN) 2 % Apply 1 application topically daily as needed (nasal sores).     Marland Kitchen omeprazole (PRILOSEC) 20 MG capsule Take 1 capsule (20 mg total) by mouth daily. (Patient taking differently: Take 20 mg by mouth 2 (two) times daily before a meal. ) 30 capsule 1  . oxyCODONE (OXY IR/ROXICODONE) 5 MG immediate release tablet Take 0.5 tablets (2.5 mg total) by mouth every 6 (six) hours. (Patient taking differently: Take 2.5 mg by mouth in the morning and at bedtime. ) 60 tablet 0  . simethicone (MYLICON) 80 MG chewable tablet Chew 80 mg by mouth 4 (four) times daily as needed for flatulence.    . sodium chloride 1 g tablet Take 1 g by mouth 2 (two) times daily with a meal.     . latanoprost (XALATAN) 0.005 % ophthalmic solution Place 1 drop into both eyes at bedtime. 2.5 mL 12    Allergies as of 08/24/2020 - Review Complete 08/24/2020  Allergen Reaction Noted  . Augmentin [amoxicillin-pot clavulanate] Other (See Comments) 11/19/2017  . Ibuprofen Hives 01/28/2007  . Naproxen Other (See Comments) 09/30/2012  . Nsaids  07/26/2020  . Statins Other (See Comments) 01/28/2007  . Surgilube [gyne-moistrin]  07/30/2013  . Tape Rash 03/11/2012    Family History  Problem Relation Age of Onset  . Heart failure Mother   . Osteoporosis Mother   . Hypertension Father   . Stroke Father   . Heart failure Father   . Leukemia Brother   . Heart failure Brother   . Cancer Maternal Grandmother   . Stroke Maternal Grandfather     . Cancer Other     Social History   Socioeconomic History  . Marital status: Widowed    Spouse name: Not on file  . Number of children: 2  . Years of education: college   . Highest education level: Not on file  Occupational History  . Occupation: retired Tax Comptroller: RETIRED  Tobacco Use  . Smoking status: Never Smoker  . Smokeless tobacco: Never Used  . Tobacco comment: 2nd hand-husband  Vaping Use  . Vaping Use: Never used  Substance and Sexual Activity  . Alcohol use: No  . Drug use: No  . Sexual activity: Not Currently  Other Topics Concern  . Not on file  Social History Narrative   Lives at [employer] 02/2013   2 adopted children   Was in [place]   Widowed 2013   Walks with walker   Exercise: none    POA - Living Will   Social Determinants of Health   Financial Resource Strain:   . Difficulty of Paying Living Expenses: Not on file  Food Insecurity:   . Worried About Charity fundraiser in the Last Year: Not on file  . Ran Out of Food in the Last Year: Not on file  Transportation Needs:   . Lack of Transportation (Medical): Not on file  . Lack of Transportation (Non-Medical): Not on file  Physical Activity:   . Days of Exercise per Week: Not on file  . Minutes of Exercise per Session: Not on file  Stress:   . Feeling of Stress : Not on file  Social Connections:   . Frequency of Communication with Friends and Family: Not on file  . Frequency of Social Gatherings with Friends and Family: Not on file  . Attends Religious Services: Not on file  . Active Member of Clubs or Organizations: Not on file  . Attends Archivist Meetings: Not on file  . Marital Status: Not on file  Intimate Partner Violence:   . Fear of Current or Ex-Partner: Not on file  . Emotionally Abused: Not on file  . Physically Abused: Not on file  . Sexually Abused: Not on file    Review of Systems: ROS is O/W negative except as mentioned in  HPI.  Physical Exam: Vital signs in last 24 hours: Temp:  [97.4 F (36.3 C)-97.7 F (36.5 C)] 97.7 F (36.5 C) (09/08 1339) Pulse Rate:  [69-79] 73 (09/08 1430) Resp:  [15-20] 15 (09/08 1430) BP: (113-124)/(57-70) 123/60 (09/08 1430) SpO2:  [93 %-94 %] 94 % (09/08 1430) Weight:  [68.7 kg-69 kg] 69 kg (09/08 1258)   General:  Alert, Well-developed, well-nourished, pleasant and cooperative in NAD; pale. Head:  Normocephalic and atraumatic. Eyes:  Sclera clear, no icterus.  Conjunctiva pink. Ears:  HOH. Mouth:  No deformity or lesions.   Lungs:  Clear throughout to auscultation.  No wheezes, crackles, or rhonchi.  Heart:  Regular rate and rhythm; no murmurs, clicks, rubs, or gallops.  Abdomen:  Soft, non-distended.  BS present.  Non-tender. Rectal:  Deferred.  Heme positive in the ED.  Msk:  Symmetrical without gross deformities. Pulses:  Normal pulses noted. Extremities:  Without clubbing or edema. Neurologic:  Alert and oriented x 4;  grossly normal neurologically. Skin:  Intact without significant lesions or rashes.  Pale. Psych:  Alert and cooperative. Normal mood and affect.  Intake/Output this shift: Total I/O In: 50 [IV Piggyback:50] Out: -   Lab Results: Recent Labs    08/24/20 1329  WBC 11.2*  HGB 5.8*  HCT 17.6*  PLT 211   BMET Recent Labs    08/24/20 1329  NA 130*  K 4.7  CL 97*  CO2 23  GLUCOSE 113*  BUN 46*  CREATININE 1.08*  CALCIUM 8.4*   LFT Recent Labs    08/24/20 1329  PROT 6.0*  ALBUMIN 2.9*  AST 20  ALT 18  ALKPHOS 98  BILITOT 0.4   PT/INR Recent Labs    08/24/20 1329  LABPROT 12.7  INR 1.0   IMPRESSION:  *GI bleeding ? Diverticular as she had a massive diverticular bleed in the past vs ulcer disease/UGIB as her BUN is up and she had been on meloxicam recently. *ABLA:  Secondary to above.  Hgb 10.8 grams just two weeks ago and down to 5.8 grams today.  Has not received PRBCs yet.  PLAN: *Transfuse and monitor blood  counts. *If bleeds briskly overnight then will need a CTA as she likely cannot tolerate a bleeding scan with her recent severe hip pain. *Continue PPI gtt for now. *Will reassess in the morning.   Laban Emperor. Carrington Olazabal  08/24/2020, 3:18 PM

## 2020-08-24 NOTE — ED Notes (Signed)
Date and time results received: 08/24/20 2:03 PM  (use smartphrase ".now" to insert current time)  Test: Hgb Critical Value: 5.8  Name of Provider Notified: P.A. Wylder  Orders Received? Or Actions Taken?:

## 2020-08-24 NOTE — Assessment & Plan Note (Signed)
The patient's son thinks the patient is depressed, the patient appears flat affect and exhausted, not crying today, able to understand her Hgb dropped to 5.6 today and make decision regarding her anemia.

## 2020-08-24 NOTE — ED Notes (Signed)
IV team at bedside 

## 2020-08-24 NOTE — ED Triage Notes (Addendum)
Arrives via EMS from [facility], HMG was around 7 now it is 5.6 per labs. Patient has a history of GI bleeds, per staff she saturated around 7-8 pads yesterday,some light bleeding today. Alert and oriented, uses a power walker.

## 2020-08-24 NOTE — Progress Notes (Signed)
Location:   SNF [place] Room Number: 2 Place of Service:  SNF (31) Provider: [facility] Keyandre Pileggi NP  Virgie Dad, MD  Patient Care Team: Virgie Dad, MD as PCP - General (Internal Medicine) Lanelle Bal., MD as Referring Physician (Internal Medicine) Satira Sark, MD as Consulting Physician (Cardiology) Kyung Rudd, MD as Consulting Physician (Radiation Oncology) Breuna Loveall X, NP as Nurse Practitioner (Internal Medicine)  Extended Emergency Contact Information Primary Emergency Contact: Neita Garnet of [place]  Phone: [phone] Mobile Phone: [phone] Relation: Legal Guardian Secondary Emergency Contact: Farner,Lance Address: [address]          [place], [place] [address] [name] of [place] Phone: [phone] Mobile Phone: [phone] Relation: Son  Code Status: DNR Goals of care: Advanced Directive information Advanced Directives 08/24/2020  Does Patient Have a Medical Advance Directive? Yes  Type of Paramedic of [place];Living will;Out of facility DNR (pink MOST or yellow form)  Does patient want to make changes to medical advance directive? No - Patient declined  Copy of [place] in Chart? Yes - validated most recent copy scanned in chart (See row information)  Would patient like information on creating a medical advance directive? -  Pre-existing out of facility DNR order (yellow form or pink MOST form) Yellow form placed in chart (order not valid for inpatient use);Pink MOST form placed in chart (order not valid for inpatient use)     Chief Complaint  Patient presents with  . Acute Visit    Blood loss, anemia    HPI:  Pt is a 86 y.o. female seen today for an acute visit for blood loss anemia, lower GI bleeding, Hgb 5.6 today dropped from 7.8 yesterday, the patient appears pale, exhausted, agreed to ED for eval/tx for blood transfusion. The patient denied chest  pain/pressure, palpitation, nausea, vomiting, abd pain, last loose stool with blood was yesterday, already taking iron supplement. Hx of GERD, diverticulitis, increased Omeprazole to bid. R hip pain, off Meloxicam, takes Oxycodone, Gabapentin, Tylenol. Hyponatremia, Na 130s her baseline. PSVT takes Atenolol. She takes Duloxetine for depression, her son thinks she is depressed.     Past Medical History:  Diagnosis Date  . Allergic rhinitis   . Anemia    NOS  . Anxiety   . Biceps tendon rupture    Bilateral  . Breast cancer ([place]) 2001   Left s/p lumpectomy and XRT   . Bronchiectasis ([place]) 2014   Noted on chest x-ray  . Complication of anesthesia    slow to wake up  . Cough 2014  . Depression   . Diverticulosis   . Elevated liver enzymes    Biopsy consistent with steatohepatitis  . Fundic gland polyps of stomach, benign   . GERD (gastroesophageal reflux disease) 06/2007   EGD Dr Gala Romney >sm HH, multiple fundic gland polyps  . Glaucoma 2013   both eyes  . Hemorrhoids   . Hiatal hernia   . Hx of radiation therapy 07/17/10 to 07/21/10   SRS LLL lung  . Hyperlipidemia   . Insomnia   . Low back pain    scoliosis  . Lung cancer ([place]) 05/2010   Left 2011 - rad x 3  . Lymphedema    Left arm  . Osteoarthritis   . Osteopenia 10/25/2016  . Osteoporosis, senile   . Overactive bladder   . Pneumonia    RLL with sepsis  . PSVT (paroxysmal supraventricular tachycardia) ([place])   .  Rheumatic fever    Age 37  . Right thyroid nodule   . Steatohepatitis    liver bx  . Type 2 diabetes mellitus ([place])    Past Surgical History:  Procedure Laterality Date  . ABDOMINAL HYSTERECTOMY  1964  . APPENDECTOMY  1964  . BIOPSY THYROID  11/2008  . BREAST BIOPSY     X 3 w/ cystectomy  . BREAST LUMPECTOMY  2001  . CATARACT EXTRACTION Bilateral 2003   Lens implant Dr. Charise Killian  . CHOLECYSTECTOMY  1965  . COLONOSCOPY  09/11/2011   Procedure: COLONOSCOPY;  Surgeon: Dorothyann Peng, MD;  Location: [facility];   Service: Endoscopy;  Laterality: N/A;  . COLONOSCOPY  2005 /2012   Dr. Lucianne Muss- diverticulosis, ext hemorrhoids.  . COLONOSCOPY WITH PROPOFOL N/A 11/09/2017   Procedure: COLONOSCOPY WITH PROPOFOL;  Surgeon: Mauri Pole, MD;  Location: [facility];  Service: Endoscopy;  Laterality: N/A;  . COLONOSCOPY WITH PROPOFOL N/A 11/12/2017   Procedure: COLONOSCOPY WITH PROPOFOL;  Surgeon: Ladene Artist, MD;  Location: [facility];  Service: Endoscopy;  Laterality: N/A;  . CYSTOSCOPY  2007  . ESOPHAGOGASTRODUODENOSCOPY  09/11/2011   Procedure: ESOPHAGOGASTRODUODENOSCOPY (EGD);  Surgeon: Dorothyann Peng, MD;  [facility];  Service: Endoscopy;  Laterality: N/A;  . ESOPHAGOGASTRODUODENOSCOPY (EGD) WITH PROPOFOL N/A 11/08/2017   Procedure: ESOPHAGOGASTRODUODENOSCOPY (EGD) WITH PROPOFOL;  Surgeon: Mauri Pole, MD;  Location: [facility];  Service: Endoscopy;  Laterality: N/A;  . LIVER BIOPSY  2008  . LUNG BIOPSY Left 06/06/2010  . REVISION TOTAL HIP ARTHROPLASTY  2007   Left  . SKIN CANCER EXCISION     nose and left lower cheek  . TONSILLECTOMY  1930  . TOTAL HIP ARTHROPLASTY Bilateral 2005 & 2007    Dr. Earl Lagos. Aline Brochure     Allergies  Allergen Reactions  . Augmentin [Amoxicillin-Pot Clavulanate] Other (See Comments)    unknown  . Ibuprofen Hives  . Naproxen Other (See Comments)    Unknown Tears stomach up  . Nsaids     Tears stomach up  . Statins Other (See Comments)    Feels like knives in stomach  . Surgilube [Gyne-Moistrin]     Rectal itching, burning  . Tape Rash    Allergies as of 08/24/2020      Reactions   Augmentin [amoxicillin-pot Clavulanate] Other (See Comments)   unknown   Ibuprofen Hives   Naproxen Other (See Comments)   Unknown Tears stomach up   Nsaids    Tears stomach up   Statins Other (See Comments)   Feels like knives in stomach   Surgilube [gyne-moistrin]    Rectal itching, burning   Tape Rash      Medication List        Accurate as of August 24, 2020 11:36 AM. If you have any questions, ask your nurse or doctor.        acetaminophen 325 MG tablet Commonly known as: TYLENOL Take 650 mg by mouth in the morning, at noon, in the evening, and at bedtime.   atenolol 25 MG tablet Commonly known as: TENORMIN Take 1 tablet (25 mg total) by mouth daily.   diclofenac Sodium 1 % Gel Commonly known as: Voltaren Apply 2 g topically 4 (four) times daily as needed.   DULoxetine 30 MG capsule Commonly known as: CYMBALTA Take 1 capsule (30 mg total) by mouth daily.   ferrous sulfate 220 (44 Fe) MG/5ML solution Take 220 mg by mouth daily. 5 mL by  mouth daily with meal Once A Day   gabapentin 100 MG capsule Commonly known as: NEURONTIN Take 1 capsule (100 mg total) by mouth 2 (two) times daily.   Imvexxy Maintenance Pack 4 MCG Inst Generic drug: Estradiol Place 4 mcg vaginally once a week. On Thursday   latanoprost 0.005 % ophthalmic solution Commonly known as: XALATAN Place 1 drop into both eyes at bedtime.   loperamide 2 MG tablet Commonly known as: IMODIUM A-D Take 4 mg by mouth 4 (four) times daily as needed for diarrhea or loose stools.   mupirocin ointment 2 % Commonly known as: BACTROBAN Apply 1 application topically daily as needed (nasal sores).   omeprazole 20 MG capsule Commonly known as: PRILOSEC Take 1 capsule (20 mg total) by mouth daily. What changed: when to take this   oxyCODONE 5 MG immediate release tablet Commonly known as: Oxy IR/ROXICODONE Take 0.5 tablets (2.5 mg total) by mouth every 6 (six) hours. What changed: when to take this   PreserVision AREDS 2 Caps Take 1 capsule by mouth 2 (two) times daily.   simethicone 80 MG chewable tablet Commonly known as: MYLICON Chew 80 mg by mouth 4 (four) times daily as needed for flatulence.   sodium chloride 1 g tablet Take 1 g by mouth 2 (two) times daily with a meal.       Review of Systems  Constitutional: Positive  for activity change, appetite change and fatigue. Negative for chills, diaphoresis and fever.  HENT: Positive for hearing loss. Negative for congestion and voice change.   Eyes: Negative for visual disturbance.  Respiratory: Positive for shortness of breath. Negative for cough and wheezing.        DOE  Cardiovascular: Positive for leg swelling.  Gastrointestinal: Positive for blood in stool and constipation. Negative for abdominal distention, abdominal pain, nausea, rectal pain and vomiting.       Loose stools  Genitourinary: Positive for frequency. Negative for difficulty urinating and dysuria.  Musculoskeletal: Positive for arthralgias and gait problem.       Acute right hip pain with ROM, weight bearing.   Skin: Positive for color change and pallor.  Neurological: Positive for weakness. Negative for dizziness, speech difficulty and headaches.       Low body weakness, uses electric scooter for mobility.   Psychiatric/Behavioral: Positive for dysphoric mood. Negative for behavioral problems, confusion, hallucinations and sleep disturbance. The patient is not nervous/anxious.        Repetitive.     Immunization History  Administered Date(s) Administered  . Influenza Whole 09/22/2008, 09/16/2012, 09/30/2013  . Influenza, High Dose Seasonal PF 09/25/2017, 09/19/2019  . Influenza,inj,Quad PF,6+ Mos 10/09/2018  . Influenza-Unspecified 10/02/2014, 09/15/2015, 09/27/2016  . Moderna SARS-COVID-2 Vaccination 12/19/2019, 01/16/2020  . Pneumococcal Conjugate-13 11/29/2017  . Pneumococcal Polysaccharide-23 08/17/2006  . Td 12/17/2005   Pertinent  Health Maintenance Due  Topic Date Due  . FOOT EXAM  Never done  . URINE MICROALBUMIN  04/30/2008  . OPHTHALMOLOGY EXAM  12/27/2019  . INFLUENZA VACCINE  07/17/2020  . HEMOGLOBIN A1C  11/17/2020  . DEXA SCAN  Completed  . PNA vac Low Risk Adult  Completed   Fall Risk  10/07/2018 09/19/2017 05/17/2016 04/20/2016 04/19/2016  Falls in the past year? Yes  No No No No  Number falls in past yr: 1 - - - -  Injury with Fall? No - - - -   Functional Status Survey:    Vitals:   08/24/20 1102  BP: 122/70  Pulse:  79  Resp: 19  Temp: (!) 97.4 F (36.3 C)  SpO2: 94%  Weight: 151 lb 6.4 oz (68.7 kg)  Height: 5' 5"  (1.651 m)   Body mass index is 25.19 kg/m. Physical Exam Vitals and nursing note reviewed.  Constitutional:      Comments: Exhausted   HENT:     Head: Normocephalic and atraumatic.     Mouth/Throat:     Mouth: Mucous membranes are moist.     Comments: Yellow thick coating on tongue, a canker sore right side of tongue Eyes:     Extraocular Movements: Extraocular movements intact.     Conjunctiva/sclera: Conjunctivae normal.     Pupils: Pupils are equal, round, and reactive to light.  Cardiovascular:     Rate and Rhythm: Normal rate and regular rhythm.     Heart sounds: No murmur heard.   Pulmonary:     Breath sounds: Rales present.     Comments: Bibasilar rales Abdominal:     General: Bowel sounds are normal. There is no distension.     Palpations: Abdomen is soft.     Tenderness: There is no abdominal tenderness. There is no right CVA tenderness, left CVA tenderness, guarding or rebound.  Musculoskeletal:        General: Tenderness present.     Cervical back: Normal range of motion and neck supple.     Right lower leg: Edema present.     Left lower leg: Edema present.     Comments: Trace edema BLE. R hip pain with ROM, weight bearing.   Skin:    General: Skin is warm and dry.     Coloration: Skin is pale. Skin is not jaundiced.     Comments: A small dark red ecchymotic are on the top of the right toe, no heat, warmth, swelling, or tenderness upon my examination.   Neurological:     General: No focal deficit present.     Mental Status: She is alert and oriented to person, place, and time. Mental status is at baseline.     Gait: Gait abnormal.  Psychiatric:        Mood and Affect: Mood normal.        Behavior:  Behavior normal.        Thought Content: Thought content normal.     Comments: Flat affect, not crying today     Labs reviewed: Recent Labs    06/21/20 0000 07/19/20 0000 07/27/20 0433 07/27/20 0433 07/28/20 0436 07/29/20 0422 08/10/20 0000  NA 133*   < > 127*   < > 131* 130* 130*  K 5.0   < > 4.0   < > 4.0 3.9 4.4  CL 98*   < > 93*   < > 96* 96* 96*  CO2 30*   < > 25   < > 26 26 24*  GLUCOSE  --    < > 98  --  94 94  --   BUN 24*   < > 14   < > 15 17 25*  CREATININE 0.9   < > 0.67   < > 0.79 0.78 0.8  CALCIUM 8.7   < > 8.6*   < > 8.9 9.1 9.0  MG 1.8  --   --   --  1.9 1.8  --   PHOS  --   --   --   --  3.2 3.5  --    < > = values in this interval not displayed.  Recent Labs    07/27/20 0433 07/27/20 0433 07/28/20 0436 07/29/20 0422 08/10/20 0000  AST 34   < > 27 24 22   ALT 26   < > 24 22 16   ALKPHOS 89   < > 83 77 101  BILITOT 0.5  --  0.7 0.8  --   PROT 6.8  --  6.7 6.8  --   ALBUMIN 3.1*   < > 3.2* 3.1* 3.5   < > = values in this interval not displayed.   Recent Labs    07/27/20 0433 07/27/20 0433 07/28/20 0436 07/29/20 0422 08/10/20 0000  WBC 7.0   < > 7.4 8.0 6.7  NEUTROABS  --   --  5.1 5.8 4,576  HGB 11.7*   < > 12.2 11.5* 10.8*  HCT 36.1   < > 38.0 34.9* 33*  MCV 99.2  --  98.4 96.7  --   PLT 262   < > 287 284 254   < > = values in this interval not displayed.   Lab Results  Component Value Date   TSH 2.338 07/26/2020   Lab Results  Component Value Date   HGBA1C 5.3 05/18/2020   Lab Results  Component Value Date   CHOL 188 04/12/2016   HDL 62 04/12/2016   LDLCALC 102 04/12/2016   TRIG 122 04/12/2016   CHOLHDL 3.0 Ratio 06/07/2008    Significant Diagnostic Results in last 30 days:  MR Hip Right w/o contrast  Result Date: 08/09/2020 CLINICAL DATA:  Right hip pain for 1 month. History of prior hip replacement. EXAM: MR OF THE RIGHT HIP WITHOUT CONTRAST TECHNIQUE: Multiplanar, multisequence MR imaging was performed. No intravenous  contrast was administered. COMPARISON:  Plain films of the hips 07/11/2020. FINDINGS: Bones/Joint/Cartilage Despite using artifact reduction techniques in this patient with bilateral hip arthroplasties, there is artifact from the study somewhat limiting the exam. No fracture, stress change or worrisome lesion is identified. There is no evidence of hardware loosening. No bony destructive change about either hip. No joint effusion. No mass or fluid collection is seen about either hip. Ligaments Visualization is limited but no obvious abnormality is seen. Muscles and Tendons Intact and unremarkable. Soft tissues Imaged intrapelvic contents demonstrate sigmoid diverticulosis. The patient appears to be status post hysterectomy. IMPRESSION: No acute abnormality or finding to explain the patient's symptoms. No complicating feature related to the patient's right hip replacement. Diverticulosis. Electronically Signed   By: Inge Rise M.D.   On: 08/09/2020 15:19   DG CHEST PORT 1 VIEW  Result Date: 07/27/2020 CLINICAL DATA:  Shortness of breath. EXAM: PORTABLE CHEST 1 VIEW COMPARISON:  02/25/2019 FINDINGS: The heart is within normal limits in size given the AP projection and portable technique. Mild tortuosity and calcification of the thoracic aorta. Chronic or recurrent left pleural effusion and left lower lobe atelectasis. Minimal streaky right basilar atelectasis. No pulmonary edema or worrisome pulmonary lesions. The bony thorax is intact IMPRESSION: Chronic or recurrent left pleural effusion and left lower lobe atelectasis. Electronically Signed   By: Marijo Sanes M.D.   On: 07/27/2020 05:45   DG Toe Great Right  Result Date: 07/27/2020 CLINICAL DATA:  Rolled over toe EXAM: RIGHT GREAT TOE COMPARISON:  None. FINDINGS: Osteopenia. No acute fracture or dislocation. Joint spaces and alignment are maintained for age. No area of erosion or osseous destruction. No unexpected radiopaque foreign body. Soft tissues  are unremarkable. IMPRESSION: No acute fracture or dislocation. Electronically Signed   By: Colletta Maryland  Peacock MD   On: 07/27/2020 11:30    Assessment/Plan: Blood loss anemia  blood loss anemia, lower GI bleeding, Hgb 5.6 today dropped from 7.8 yesterday, the patient appears pale, exhausted, agreed to ED for eval/tx for blood transfusion. The patient denied chest pain/pressure, palpitation, nausea, vomiting, abd pain, last loose stool with blood was yesterday, already taking iron supplement.  GERD (gastroesophageal reflux disease) Hx of GERD, diverticulitis, increased Omeprazole to bid in setting of GI bleed.   Right hip pain R hip pain, off Meloxicam, takes Oxycodone, Gabapentin, Tylenol.   Hyponatremia Baseline Na 130s, continue NaCl tablet  Depression with anxiety The patient's son thinks the patient is depressed, the patient appears flat affect and exhausted, not crying today, able to understand her Hgb dropped to 5.6 today and make decision regarding her anemia.   PSVT (paroxysmal supraventricular tachycardia) (HCC) Heart rate is in control, continue Atenolol.     Family/ staff Communication: plan of care reviewed with the patient and charge nurse.   Labs/tests ordered:  CBC done today  Time spend 35 minutes.

## 2020-08-24 NOTE — Assessment & Plan Note (Signed)
Baseline Na 130s, continue NaCl tablet

## 2020-08-24 NOTE — ED Notes (Signed)
Patient had a an episode of incontinent with bloody stool, thick, dark clots noted to BM. Patient cleaned and repositioned.

## 2020-08-24 NOTE — Assessment & Plan Note (Signed)
Hx of GERD, diverticulitis, increased Omeprazole to bid in setting of GI bleed.

## 2020-08-24 NOTE — Assessment & Plan Note (Signed)
R hip pain, off Meloxicam, takes Oxycodone, Gabapentin, Tylenol.

## 2020-08-24 NOTE — Progress Notes (Signed)
Location:   [facility] Room Number: 63 Place of Service:  SNF (31) Provider:  Marlana Latus, NP  Virgie Dad, MD  Patient Care Team: Virgie Dad, MD as PCP - General (Internal Medicine) Lanelle Bal., MD as Referring Physician (Internal Medicine) Satira Sark, MD as Consulting Physician (Cardiology) Kyung Rudd, MD as Consulting Physician (Radiation Oncology) Mast, Man X, NP as Nurse Practitioner (Internal Medicine)  Extended Emergency Contact Information Primary Emergency Contact: Neita Garnet of [place] Phone: [phone] Mobile Phone: [phone] Relation: Legal Guardian Secondary Emergency Contact: Ketcher,Lance Address: [address]          [place], [place] [address] [name] of [place] Phone: [phone] Mobile Phone: [phone] Relation: Son  Code Status:  DNR Goals of care: Advanced Directive information Advanced Directives 08/24/2020  Does Patient Have a Medical Advance Directive? Yes  Type of Paramedic of [place];Living will;Out of facility DNR (pink MOST or yellow form)  Does patient want to make changes to medical advance directive? No - Patient declined  Copy of [place] in Chart? Yes - validated most recent copy scanned in chart (See row information)  Would patient like information on creating a medical advance directive? -  Pre-existing out of facility DNR order (yellow form or pink MOST form) Yellow form placed in chart (order not valid for inpatient use);Pink MOST form placed in chart (order not valid for inpatient use)     Chief Complaint  Patient presents with  . Acute Visit    Blood loss, anemia    HPI:  Pt is a 86 y.o. female seen today for an acute visit for    Past Medical History:  Diagnosis Date  . Allergic rhinitis   . Anemia    NOS  . Anxiety   . Biceps tendon rupture    Bilateral  . Breast cancer ([place]) 2001   Left s/p  lumpectomy and XRT   . Bronchiectasis ([place]) 2014   Noted on chest x-ray  . Complication of anesthesia    slow to wake up  . Cough 2014  . Depression   . Diverticulosis   . Elevated liver enzymes    Biopsy consistent with steatohepatitis  . Fundic gland polyps of stomach, benign   . GERD (gastroesophageal reflux disease) 06/2007   EGD Dr Gala Romney >sm HH, multiple fundic gland polyps  . Glaucoma 2013   both eyes  . Hemorrhoids   . Hiatal hernia   . Hx of radiation therapy 07/17/10 to 07/21/10   SRS LLL lung  . Hyperlipidemia   . Insomnia   . Low back pain    scoliosis  . Lung cancer ([place]) 05/2010   Left 2011 - rad x 3  . Lymphedema    Left arm  . Osteoarthritis   . Osteopenia 10/25/2016  . Osteoporosis, senile   . Overactive bladder   . Pneumonia    RLL with sepsis  . PSVT (paroxysmal supraventricular tachycardia) ([place])   . Rheumatic fever    Age 89  . Right thyroid nodule   . Steatohepatitis    liver bx  . Type 2 diabetes mellitus ([place])    Past Surgical History:  Procedure Laterality Date  . ABDOMINAL HYSTERECTOMY  1964  . APPENDECTOMY  1964  . BIOPSY THYROID  11/2008  . BREAST BIOPSY     X 3 w/ cystectomy  . BREAST LUMPECTOMY  2001  . CATARACT EXTRACTION Bilateral  2003   Lens implant Dr. Charise Killian  . CHOLECYSTECTOMY  1965  . COLONOSCOPY  09/11/2011   Procedure: COLONOSCOPY;  Surgeon: Dorothyann Peng, MD;  Location[facility];  Service: Endoscopy;  Laterality: N/A;  . COLONOSCOPY  2005 /2012   Dr. Lucianne Muss- diverticulosis, ext hemorrhoids.  . COLONOSCOPY WITH PROPOFOL N/A 11/09/2017   Procedure: COLONOSCOPY WITH PROPOFOL;  Surgeon: Mauri Pole, MD;  Location: [facility];  Service: Endoscopy;  Laterality: N/A;  . COLONOSCOPY WITH PROPOFOL N/A 11/12/2017   Procedure: COLONOSCOPY WITH PROPOFOL;  Surgeon: Ladene Artist, MD;  Location: [facility];  Service: Endoscopy;  Laterality: N/A;  . CYSTOSCOPY  2007  . ESOPHAGOGASTRODUODENOSCOPY  09/11/2011   Procedure:  ESOPHAGOGASTRODUODENOSCOPY (EGD);  Surgeon: Dorothyann Peng, MD;  [facility];  Service: Endoscopy;  Laterality: N/A;  . ESOPHAGOGASTRODUODENOSCOPY (EGD) WITH PROPOFOL N/A 11/08/2017   Procedure: ESOPHAGOGASTRODUODENOSCOPY (EGD) WITH PROPOFOL;  Surgeon: Mauri Pole, MD;  Location: [facility];  Service: Endoscopy;  Laterality: N/A;  . LIVER BIOPSY  2008  . LUNG BIOPSY Left 06/06/2010  . REVISION TOTAL HIP ARTHROPLASTY  2007   Left  . SKIN CANCER EXCISION     nose and left lower cheek  . TONSILLECTOMY  1930  . TOTAL HIP ARTHROPLASTY Bilateral 2005 & 2007    Dr. Earl Lagos. Aline Brochure     Allergies  Allergen Reactions  . Augmentin [Amoxicillin-Pot Clavulanate] Other (See Comments)    unknown  . Ibuprofen Hives  . Naproxen Other (See Comments)    Unknown Tears stomach up  . Nsaids     Tears stomach up  . Statins Other (See Comments)    Feels like knives in stomach  . Surgilube [Gyne-Moistrin]     Rectal itching, burning  . Tape Rash    Allergies as of 08/24/2020      Reactions   Augmentin [amoxicillin-pot Clavulanate] Other (See Comments)   unknown   Ibuprofen Hives   Naproxen Other (See Comments)   Unknown Tears stomach up   Nsaids    Tears stomach up   Statins Other (See Comments)   Feels like knives in stomach   Surgilube [gyne-moistrin]    Rectal itching, burning   Tape Rash      Medication List       Accurate as of August 24, 2020 11:13 AM. If you have any questions, ask your nurse or doctor.        acetaminophen 325 MG tablet Commonly known as: TYLENOL Take 650 mg by mouth in the morning, at noon, in the evening, and at bedtime.   atenolol 25 MG tablet Commonly known as: TENORMIN Take 1 tablet (25 mg total) by mouth daily.   diclofenac Sodium 1 % Gel Commonly known as: Voltaren Apply 2 g topically 4 (four) times daily as needed.   DULoxetine 30 MG capsule Commonly known as: CYMBALTA Take 1 capsule (30 mg total) by mouth  daily.   ferrous sulfate 220 (44 Fe) MG/5ML solution Take 220 mg by mouth daily. 5 mL by mouth daily with meal Once A Day   gabapentin 100 MG capsule Commonly known as: NEURONTIN Take 1 capsule (100 mg total) by mouth 2 (two) times daily.   Imvexxy Maintenance Pack 4 MCG Inst Generic drug: Estradiol Place 4 mcg vaginally once a week. On Thursday   latanoprost 0.005 % ophthalmic solution Commonly known as: XALATAN Place 1 drop into both eyes at bedtime.   loperamide 2 MG tablet Commonly known as: IMODIUM  A-D Take 4 mg by mouth 4 (four) times daily as needed for diarrhea or loose stools.   mupirocin ointment 2 % Commonly known as: BACTROBAN Apply 1 application topically daily as needed (nasal sores).   omeprazole 20 MG capsule Commonly known as: PRILOSEC Take 1 capsule (20 mg total) by mouth daily. What changed: when to take this   oxyCODONE 5 MG immediate release tablet Commonly known as: Oxy IR/ROXICODONE Take 0.5 tablets (2.5 mg total) by mouth every 6 (six) hours. What changed: when to take this   PreserVision AREDS 2 Caps Take 1 capsule by mouth 2 (two) times daily.   simethicone 80 MG chewable tablet Commonly known as: MYLICON Chew 80 mg by mouth 4 (four) times daily as needed for flatulence.   sodium chloride 1 g tablet Take 1 g by mouth 2 (two) times daily with a meal.       Review of Systems  Immunization History  Administered Date(s) Administered  . Influenza Whole 09/22/2008, 09/16/2012, 09/30/2013  . Influenza, High Dose Seasonal PF 09/25/2017, 09/19/2019  . Influenza,inj,Quad PF,6+ Mos 10/09/2018  . Influenza-Unspecified 10/02/2014, 09/15/2015, 09/27/2016  . Moderna SARS-COVID-2 Vaccination 12/19/2019, 01/16/2020  . Pneumococcal Conjugate-13 11/29/2017  . Pneumococcal Polysaccharide-23 08/17/2006  . Td 12/17/2005   Pertinent  Health Maintenance Due  Topic Date Due  . FOOT EXAM  Never done  . URINE MICROALBUMIN  04/30/2008  . OPHTHALMOLOGY  EXAM  12/27/2019  . INFLUENZA VACCINE  07/17/2020  . HEMOGLOBIN A1C  11/17/2020  . DEXA SCAN  Completed  . PNA vac Low Risk Adult  Completed   Fall Risk  10/07/2018 09/19/2017 05/17/2016 04/20/2016 04/19/2016  Falls in the past year? Yes No No No No  Number falls in past yr: 1 - - - -  Injury with Fall? No - - - -   Functional Status Survey:    Vitals:   08/24/20 1102  BP: 122/70  Pulse: 79  Resp: 19  Temp: (!) 97.4 F (36.3 C)  SpO2: 94%  Weight: 151 lb 6.4 oz (68.7 kg)  Height: 5' 5"  (1.651 m)   Body mass index is 25.19 kg/m. Physical Exam  Labs reviewed: Recent Labs    06/21/20 0000 07/19/20 0000 07/27/20 0433 07/27/20 0433 07/28/20 0436 07/29/20 0422 08/10/20 0000  NA 133*   < > 127*   < > 131* 130* 130*  K 5.0   < > 4.0   < > 4.0 3.9 4.4  CL 98*   < > 93*   < > 96* 96* 96*  CO2 30*   < > 25   < > 26 26 24*  GLUCOSE  --    < > 98  --  94 94  --   BUN 24*   < > 14   < > 15 17 25*  CREATININE 0.9   < > 0.67   < > 0.79 0.78 0.8  CALCIUM 8.7   < > 8.6*   < > 8.9 9.1 9.0  MG 1.8  --   --   --  1.9 1.8  --   PHOS  --   --   --   --  3.2 3.5  --    < > = values in this interval not displayed.   Recent Labs    07/27/20 0433 07/27/20 0433 07/28/20 0436 07/29/20 0422 08/10/20 0000  AST 34   < > 27 24 22   ALT 26   < > 24 22 16  ALKPHOS 89   < > 83 77 101  BILITOT 0.5  --  0.7 0.8  --   PROT 6.8  --  6.7 6.8  --   ALBUMIN 3.1*   < > 3.2* 3.1* 3.5   < > = values in this interval not displayed.   Recent Labs    07/27/20 0433 07/27/20 0433 07/28/20 0436 07/29/20 0422 08/10/20 0000  WBC 7.0   < > 7.4 8.0 6.7  NEUTROABS  --   --  5.1 5.8 4,576  HGB 11.7*   < > 12.2 11.5* 10.8*  HCT 36.1   < > 38.0 34.9* 33*  MCV 99.2  --  98.4 96.7  --   PLT 262   < > 287 284 254   < > = values in this interval not displayed.   Lab Results  Component Value Date   TSH 2.338 07/26/2020   Lab Results  Component Value Date   HGBA1C 5.3 05/18/2020   Lab Results   Component Value Date   CHOL 188 04/12/2016   HDL 62 04/12/2016   LDLCALC 102 04/12/2016   TRIG 122 04/12/2016   CHOLHDL 3.0 Ratio 06/07/2008    Significant Diagnostic Results in last 30 days:  MR Hip Right w/o contrast  Result Date: 08/09/2020 CLINICAL DATA:  Right hip pain for 1 month. History of prior hip replacement. EXAM: MR OF THE RIGHT HIP WITHOUT CONTRAST TECHNIQUE: Multiplanar, multisequence MR imaging was performed. No intravenous contrast was administered. COMPARISON:  Plain films of the hips 07/11/2020. FINDINGS: Bones/Joint/Cartilage Despite using artifact reduction techniques in this patient with bilateral hip arthroplasties, there is artifact from the study somewhat limiting the exam. No fracture, stress change or worrisome lesion is identified. There is no evidence of hardware loosening. No bony destructive change about either hip. No joint effusion. No mass or fluid collection is seen about either hip. Ligaments Visualization is limited but no obvious abnormality is seen. Muscles and Tendons Intact and unremarkable. Soft tissues Imaged intrapelvic contents demonstrate sigmoid diverticulosis. The patient appears to be status post hysterectomy. IMPRESSION: No acute abnormality or finding to explain the patient's symptoms. No complicating feature related to the patient's right hip replacement. Diverticulosis. Electronically Signed   By: Inge Rise M.D.   On: 08/09/2020 15:19   DG CHEST PORT 1 VIEW  Result Date: 07/27/2020 CLINICAL DATA:  Shortness of breath. EXAM: PORTABLE CHEST 1 VIEW COMPARISON:  02/25/2019 FINDINGS: The heart is within normal limits in size given the AP projection and portable technique. Mild tortuosity and calcification of the thoracic aorta. Chronic or recurrent left pleural effusion and left lower lobe atelectasis. Minimal streaky right basilar atelectasis. No pulmonary edema or worrisome pulmonary lesions. The bony thorax is intact IMPRESSION: Chronic or  recurrent left pleural effusion and left lower lobe atelectasis. Electronically Signed   By: Marijo Sanes M.D.   On: 07/27/2020 05:45   DG Toe Great Right  Result Date: 07/27/2020 CLINICAL DATA:  Rolled over toe EXAM: RIGHT GREAT TOE COMPARISON:  None. FINDINGS: Osteopenia. No acute fracture or dislocation. Joint spaces and alignment are maintained for age. No area of erosion or osseous destruction. No unexpected radiopaque foreign body. Soft tissues are unremarkable. IMPRESSION: No acute fracture or dislocation. Electronically Signed   By: Valentino Saxon MD   On: 07/27/2020 11:30    Assessment/Plan There are no diagnoses linked to this encounter.   Family/ staff Communication:   Labs/tests ordered:

## 2020-08-24 NOTE — Assessment & Plan Note (Signed)
blood loss anemia, lower GI bleeding, Hgb 5.6 today dropped from 7.8 yesterday, the patient appears pale, exhausted, agreed to ED for eval/tx for blood transfusion. The patient denied chest pain/pressure, palpitation, nausea, vomiting, abd pain, last loose stool with blood was yesterday, already taking iron supplement.

## 2020-08-24 NOTE — ED Provider Notes (Signed)
[facility] DEPT Provider Note   CSN: [id number] Arrival date & time: 08/24/20  1229     History No chief complaint on file.   [name] is a 86 y.o. female.  HPI Patient is 86 year old female with past medical history significant for diverticulosis with significant bleeding requiring 3 clips 2018, DM 2, anxiety, reflux, hemorrhoids, DM 2  Patient is presented today with fatigue for 4-5 days.  She states that she has also had blood in her stools.  She was seen by the [facility] medical team who got basic lab work on her and there has been a decrease in her hemoglobin from 7--> 5.6.  She denies any nausea vomiting or abdominal pain.  She states she has had issues of diverticulitis in the past but not for several years.  She denies any chest pain shortness of breath.  She states that she has been feeling more fatigued and although she gets around in a power chair she states that it is more difficult for her even to transfer.  Denies any other bleeding.  Has had no epistaxis or vaginal bleeding.   My review of EMR patient had colonoscopy done 2018 (nov) by Dr. Silverio Decamp who found bleeding diverticular lesions.  These were clipped.  "  The perianal exam findings include a perianal rash.      Multiple small and large-mouthed diverticula were found in the sigmoid       colon, descending colon and transverse colon. There was active bleeding       coming from the diverticular opening in transverse colon with adherent       clot. To stop active bleeding, three hemostatic clips were successfully       placed (MR conditional). There was no bleeding at the end of the       procedure.      Non-bleeding internal hemorrhoids were found during retroflexion. The       hemorrhoids were medium-sized."     Past Medical History:  Diagnosis Date  . Allergic rhinitis   . Anemia    NOS  . Anxiety   . Biceps tendon rupture    Bilateral  . Breast cancer ([place]) 2001   Left  s/p lumpectomy and XRT   . Bronchiectasis ([place]) 2014   Noted on chest x-ray  . Complication of anesthesia    slow to wake up  . Cough 2014  . Depression   . Diverticulosis   . Elevated liver enzymes    Biopsy consistent with steatohepatitis  . Fundic gland polyps of stomach, benign   . GERD (gastroesophageal reflux disease) 06/2007   EGD Dr Gala Romney >sm HH, multiple fundic gland polyps  . Glaucoma 2013   both eyes  . Hemorrhoids   . Hiatal hernia   . Hx of radiation therapy 07/17/10 to 07/21/10   SRS LLL lung  . Hyperlipidemia   . Insomnia   . Low back pain    scoliosis  . Lung cancer ([place]) 05/2010   Left 2011 - rad x 3  . Lymphedema    Left arm  . Osteoarthritis   . Osteopenia 10/25/2016  . Osteoporosis, senile   . Overactive bladder   . Pneumonia    RLL with sepsis  . PSVT (paroxysmal supraventricular tachycardia) ([place])   . Rheumatic fever    Age 77  . Right thyroid nodule   . Steatohepatitis    liver bx  . Type 2 diabetes mellitus ([place])  Patient Active Problem List   Diagnosis Date Noted  . GI bleed 08/24/2020  . Right-sided chest wall pain 08/01/2020  . Cellulitis of right toe 08/01/2020  . Pleural effusion on left 08/01/2020  . Right hip pain 07/13/2020  . T2DM (type 2 diabetes mellitus) ([place]) 03/16/2020  . Postmenopausal symptoms 03/16/2020  . CKD (chronic kidney disease) stage 3, GFR 30-59 ml/min 07/29/2019  . Pain in gums 07/22/2019  . Amaurosis fugax of right eye 01/15/2019  . Glaucoma 01/14/2019  . Macular degeneration 01/14/2019  . Chronic diastolic CHF (congestive heart failure) ([place]) 08/13/2018  . Pulmonary hypertension, primary ([place]) 07/31/2018  . Neuropathy 06/20/2018  . Paroxysmal atrial fibrillation ([place]) 06/20/2018  . Iron deficiency 01/21/2018  . Fall 01/09/2018  . Advanced care planning/counseling discussion 11/20/2017  . Melena   . Diverticulosis of colon with hemorrhage   . Thrush 04/15/2017  . Debility 04/03/2017  . Hyponatremia  07/09/2015  . Hearing loss of both ears 04/08/2014  . HTN (hypertension) 03/27/2014  . Slow transit constipation 03/27/2014  . Skin lesion of face 03/27/2014  . Rotator cuff syndrome of right shoulder 09/30/2012  . Breast cancer ([place])   . Lung cancer, lower lobe ([place]) 10/25/2011  . PSVT (paroxysmal supraventricular tachycardia) ([place]) 03/29/2011  . INTERDIGITAL NEUROMA 03/28/2010  . IMPINGEMENT SYNDROME 03/28/2010  . THYROID NODULE, RIGHT 09/06/2008  . LEG EDEMA, BILATERAL 12/22/2007  . Depression with anxiety 07/31/2007  . Other specified disorders of adrenal gland ([place]) 05/14/2007  . Hyperlipidemia 01/28/2007  . Blood loss anemia 01/28/2007  . Allergic rhinitis 01/28/2007  . GERD (gastroesophageal reflux disease) 01/28/2007  . OVERACTIVE BLADDER 01/28/2007  . Osteoarthritis 01/28/2007  . Pain in lower back 01/28/2007  . Osteoporosis 01/28/2007    Past Surgical History:  Procedure Laterality Date  . ABDOMINAL HYSTERECTOMY  1964  . APPENDECTOMY  1964  . BIOPSY THYROID  11/2008  . BREAST BIOPSY     X 3 w/ cystectomy  . BREAST LUMPECTOMY  2001  . CATARACT EXTRACTION Bilateral 2003   Lens implant Dr. Charise Killian  . CHOLECYSTECTOMY  1965  . COLONOSCOPY  09/11/2011   Procedure: COLONOSCOPY;  Surgeon: Dorothyann Peng, MD;  Location: [facility];  Service: Endoscopy;  Laterality: N/A;  . COLONOSCOPY  2005 /2012   Dr. Lucianne Muss- diverticulosis, ext hemorrhoids.  . COLONOSCOPY WITH PROPOFOL N/A 11/09/2017   Procedure: COLONOSCOPY WITH PROPOFOL;  Surgeon: Mauri Pole, MD;  Location: [facility];  Service: Endoscopy;  Laterality: N/A;  . COLONOSCOPY WITH PROPOFOL N/A 11/12/2017   Procedure: COLONOSCOPY WITH PROPOFOL;  Surgeon: Ladene Artist, MD;  Location: [facility];  Service: Endoscopy;  Laterality: N/A;  . CYSTOSCOPY  2007  . ESOPHAGOGASTRODUODENOSCOPY  09/11/2011   Procedure: ESOPHAGOGASTRODUODENOSCOPY (EGD);  Surgeon: Dorothyann Peng, MD;  Location: [facility];  Service:  Endoscopy;  Laterality: N/A;  . ESOPHAGOGASTRODUODENOSCOPY (EGD) WITH PROPOFOL N/A 11/08/2017   Procedure: ESOPHAGOGASTRODUODENOSCOPY (EGD) WITH PROPOFOL;  Surgeon: Mauri Pole, MD;  Location: [facility];  Service: Endoscopy;  Laterality: N/A;  . LIVER BIOPSY  2008  . LUNG BIOPSY Left 06/06/2010  . REVISION TOTAL HIP ARTHROPLASTY  2007   Left  . SKIN CANCER EXCISION     nose and left lower cheek  . TONSILLECTOMY  1930  . TOTAL HIP ARTHROPLASTY Bilateral 2005 & 2007    Dr. Earl Lagos. Aline Brochure      OB History   No obstetric history on file.     Family History  Problem Relation Age  of Onset  . Heart failure Mother   . Osteoporosis Mother   . Hypertension Father   . Stroke Father   . Heart failure Father   . Leukemia Brother   . Heart failure Brother   . Cancer Maternal Grandmother   . Stroke Maternal Grandfather   . Cancer Other     Social History   Tobacco Use  . Smoking status: Never Smoker  . Smokeless tobacco: Never Used  . Tobacco comment: 2nd hand-husband  Vaping Use  . Vaping Use: Never used  Substance Use Topics  . Alcohol use: No  . Drug use: No    Home Medications Prior to Admission medications   Medication Sig Start Date End Date Taking? Authorizing Provider  acetaminophen (TYLENOL) 325 MG tablet Take 650 mg by mouth in the morning, at noon, in the evening, and at bedtime.    Yes [provider]  atenolol (TENORMIN) 25 MG tablet Take 1 tablet (25 mg total) by mouth daily. 03/05/19  Yes [place] Eric J, DO  diclofenac Sodium (VOLTAREN) 1 % GEL Apply 2 g topically 4 (four) times daily as needed. Patient taking differently: Apply 2 g topically 4 (four) times daily as needed (pain).  07/29/20  Yes Elodia Florence., MD  DULoxetine (CYMBALTA) 30 MG capsule Take 1 capsule (30 mg total) by mouth daily. 03/05/19  Yes [place] Eric J, DO  ferrous sulfate 220 (44 Fe) MG/5ML solution Take 220 mg by mouth daily. 5 mL by mouth daily with  meal Once A Day   Yes [provider]  gabapentin (NEURONTIN) 100 MG capsule Take 1 capsule (100 mg total) by mouth 2 (two) times daily. 03/05/19  Yes [place] Donnamarie Poag, DO  Multiple Vitamins-Minerals (PRESERVISION AREDS 2) CAPS Take 1 capsule by mouth 2 (two) times daily.   Yes [provider]  mupirocin ointment (BACTROBAN) 2 % Apply 1 application topically daily as needed (nasal sores).  07/22/20  Yes [provider]  omeprazole (PRILOSEC) 20 MG capsule Take 1 capsule (20 mg total) by mouth daily. Patient taking differently: Take 20 mg by mouth 2 (two) times daily before a meal.  03/05/19  Yes [place] Eric J, DO  oxyCODONE (OXY IR/ROXICODONE) 5 MG immediate release tablet Take 0.5 tablets (2.5 mg total) by mouth every 6 (six) hours. Patient taking differently: Take 2.5 mg by mouth in the morning and at bedtime.  08/08/20  Yes Virgie Dad, MD  simethicone (MYLICON) 80 MG chewable tablet Chew 80 mg by mouth 4 (four) times daily as needed for flatulence.   Yes [provider]  sodium chloride 1 g tablet Take 1 g by mouth 2 (two) times daily with a meal.    Yes [provider]  latanoprost (XALATAN) 0.005 % ophthalmic solution Place 1 drop into both eyes at bedtime. 03/05/19   [place] Donnamarie Poag, DO    Allergies    Augmentin [amoxicillin-pot clavulanate], Ibuprofen, Naproxen, Nsaids, Statins, Surgilube [gyne-moistrin], and Tape  Review of Systems   Review of Systems  Constitutional: Positive for fatigue. Negative for chills and fever.  HENT: Negative for congestion.   Eyes: Negative for pain.  Respiratory: Negative for cough and shortness of breath.   Cardiovascular: Negative for chest pain and leg swelling.  Gastrointestinal: Positive for blood in stool. Negative for abdominal pain, diarrhea, nausea and vomiting.  Genitourinary: Negative for dysuria, vaginal bleeding, vaginal discharge and vaginal pain.  Musculoskeletal: Negative for myalgias.  Skin:  Negative for rash.  Neurological: Negative for dizziness and headaches.    Physical Exam Updated Vital Signs BP 123/60   Pulse 73   Temp 97.7 F (36.5 C) (Oral)   Resp 15   Ht 5' 5"  (1.651 m)   Wt 69 kg   SpO2 94%   BMI 25.31 kg/m   Physical Exam Vitals and nursing note reviewed.  Constitutional:      General: She is not in acute distress. HENT:     Head: Normocephalic and atraumatic.     Nose: Nose normal.     Mouth/Throat:     Mouth: Mucous membranes are moist.     Comments: Pale oral mucosa and conjunctiva Eyes:     General: No scleral icterus. Cardiovascular:     Rate and Rhythm: Normal rate and regular rhythm.     Pulses: Normal pulses.     Heart sounds: Normal heart sounds.  Pulmonary:     Effort: Pulmonary effort is normal. No respiratory distress.     Breath sounds: No wheezing.  Abdominal:     Palpations: Abdomen is soft.     Tenderness: There is no abdominal tenderness. There is no guarding or rebound.  Genitourinary:    Comments: Rectal exam with small nonthrombosed external hemorrhoids.  There is visible blood in the external anus and on digital exam there is maroon melena Musculoskeletal:     Cervical back: Normal range of motion.     Right lower leg: No edema.     Left lower leg: No edema.  Skin:    General: Skin is warm and dry.     Capillary Refill: Capillary refill takes less than 2 seconds.  Neurological:     Mental Status: She is alert. Mental status is at baseline.  Psychiatric:        Mood and Affect: Mood normal.        Behavior: Behavior normal.     ED Results / Procedures / Treatments   Labs (all labs ordered are listed, but only abnormal results are displayed) Labs Reviewed  COMPREHENSIVE METABOLIC PANEL - Abnormal; Notable for the following components:      Result Value   Sodium 130 (*)    Chloride 97 (*)    Glucose, Bld 113 (*)    BUN 46 (*)    Creatinine, Ser 1.08 (*)    Calcium 8.4 (*)    Total Protein 6.0 (*)     Albumin 2.9 (*)    GFR calc non Af Amer 43 (*)    GFR calc Af Amer 50 (*)    All other components within normal limits  CBC WITH DIFFERENTIAL/PLATELET - Abnormal; Notable for the following components:   WBC 11.2 (*)    RBC 1.77 (*)    Hemoglobin 5.8 (*)    HCT 17.6 (*)    RDW 15.6 (*)    Neutro Abs 8.9 (*)    Monocytes Absolute 1.1 (*)    Abs Immature Granulocytes 0.10 (*)    All other components within normal limits  POC OCCULT BLOOD, ED - Abnormal; Notable for the following components:   Fecal Occult Bld POSITIVE (*)    All other components within normal limits  SARS CORONAVIRUS 2 BY RT PCR (HOSPITAL ORDER, [facility])  PROTIME-INR  APTT  HEMOGLOBIN AND HEMATOCRIT, BLOOD  PREALBUMIN  TYPE AND SCREEN  PREPARE RBC (CROSSMATCH)    EKG None  Radiology No results found.  Procedures Procedures (including critical care time)  Medications  Ordered in ED Medications  0.9 %  sodium chloride infusion (has no administration in time range)  oxyCODONE (Oxy IR/ROXICODONE) immediate release tablet 2.5 mg (has no administration in time range)  acetaminophen (TYLENOL) tablet 650 mg (has no administration in time range)    Or  acetaminophen (TYLENOL) suppository 650 mg (has no administration in time range)  ondansetron (ZOFRAN) tablet 4 mg (has no administration in time range)    Or  ondansetron (ZOFRAN) injection 4 mg (has no administration in time range)  albuterol (PROVENTIL) (2.5 MG/3ML) 0.083% nebulizer solution 2.5 mg (has no administration in time range)  pantoprazole (PROTONIX) 80 mg in sodium chloride 0.9 % 100 mL (0.8 mg/mL) infusion (has no administration in time range)  pantoprazole (PROTONIX) injection 40 mg (has no administration in time range)  famotidine (PEPCID) IVPB 20 mg premix (0 mg Intravenous Stopped 08/24/20 1446)    ED Course  I have reviewed the triage vital signs and the nursing notes.  Pertinent labs & imaging results that were  available during my care of the patient were reviewed by me and considered in my medical decision making (see chart for details).  Patient is 86 year old female with past medical history significant for bleeding diverticular bleed that required clipping in 2018.  She has had 3 days of symptoms and has had rapidly declining hemoglobin.  Physical exam is notable for positive melena and positive guaiac test.  She has no abdominal pain doubt diverticulitis she is also afebrile.  Clinical Course as of Aug 24 1518  Wed Aug 24, 2020  1449 Discussed with Lorrine Kin who will admit patient.  Consult to gastroenterology pending at this time.   [WF]  1450 CMP with mild hyponatremia this is a chronic issue for the patient.  Creatinine is without any acute changes BUN is elevated disproportionate to creatinine consistent with GI bleed.  She does not appear particularly dehydrated suspect this is not a prerenal changes.   [WF]  1517 Discussed with APP for [facility] on call who will see pt.   [WF]  2595 CBC with very mild leukocytosis likely hemoconcentration.  Hemoglobin 5.8 most recent CBC on my records are 2 weeks ago with hemoglobin of 10.8.  It appears that the staff did some blood work and had a 7--> 5.6 change over the last day.  Fecal occult positive patient has gross melena.  Coags within normal limits.  Type and screen be positive.  Covid swab pending.   [WF]    Clinical Course User Index [WF] Tedd Sias, [place]   I discussed this case with my attending physician who cosigned this note including patient's presenting symptoms, physical exam, and planned diagnostics and interventions. Attending physician stated agreement with plan or made changes to plan which were implemented.   Attending physician assessed patient at bedside.  MDM Rules/Calculators/A&P                          We will add patient to hospitalist.  Dr. Eustaquio Boyden to admit.  [facility] consulted and will see patient.  Final Clinical  Impression(s) / ED Diagnoses Final diagnoses:  Gastrointestinal hemorrhage with melena  Symptomatic anemia    Rx / DC Orders ED Discharge Orders    None       Tedd Sias, [place] 08/24/20 2222    Carmin Muskrat, MD 08/25/20 1719

## 2020-08-24 NOTE — Assessment & Plan Note (Signed)
Heart rate is in control, continue Atenolol.

## 2020-08-24 NOTE — H&P (Addendum)
History and Physical    [name] WSF:[id number] DOB: August 21, 1921 DOA: 08/24/2020  Referring MD/NP/PA: Pati Gallo, PA-C PCP: Virgie Dad, MD  Patient coming from: [place] via EMS  Chief Complaint: Blood in stools  I have personally briefly reviewed patient's old medical records in [facility]   HPI: [name] is a 86 y.o. female with medical history significant of HLD, DM type II, osteoporosis, breast cancer s/p lumpectomy and radiation, lung cancer s/p radiation, depression, GERD, hemorrhoids, and diverticulosis with prior GI bleed presents with complaints of blood in her stools.  Over the last 4 to 5 days she had seen blood in her stools.  Hemoglobin have been around 10.8 on 8/25, but was 7.8 yesterday down to 5.6 today.  Son notes the patient had been given a week's worth of meloxicam for severe hip pain that she has had over the last month.  Denies having any significant fever, shortness of breath, abdominal pain, nausea, vomiting, or dysuria symptoms.  She last had a diverticular bleed back in 2018 which required hemoclips to control bleeding.  ED Course: Upon admission to the emergency department patient was noted to be afebrile with vital signs relatively maintained.  Labs significant for WBC 11.2, hemoglobin 5.8, sodium 130, BUN 46, creatinine 1.08, albumin 2.9, INR 1, and APTT 29.  Stool guaiacs were noted to be positive.  Patient was typed and screened and ordered to be transfused 1 unit of packed red blood cells.  TRH called to admit.  Review of Systems  Constitutional: Positive for malaise/fatigue. Negative for fever.  HENT: Positive for hearing loss. Negative for sinus pain.   Eyes: Negative for photophobia and pain.  Respiratory: Negative for cough and shortness of breath.   Cardiovascular: Negative for chest pain and leg swelling.  Gastrointestinal: Positive for blood in stool. Negative for abdominal pain, nausea and vomiting.  Genitourinary:  Negative for dysuria and hematuria.  Musculoskeletal: Positive for joint pain and myalgias. Negative for falls.  Skin: Negative for itching.  Neurological: Negative for focal weakness and loss of consciousness.  Endo/Heme/Allergies: Bruises/bleeds easily.  Psychiatric/Behavioral: Negative for substance abuse.    Past Medical History:  Diagnosis Date  . Allergic rhinitis   . Anemia    NOS  . Anxiety   . Biceps tendon rupture    Bilateral  . Breast cancer ([place]) 2001   Left s/p lumpectomy and XRT   . Bronchiectasis ([place]) 2014   Noted on chest x-ray  . Complication of anesthesia    slow to wake up  . Cough 2014  . Depression   . Diverticulosis   . Elevated liver enzymes    Biopsy consistent with steatohepatitis  . Fundic gland polyps of stomach, benign   . GERD (gastroesophageal reflux disease) 06/2007   EGD Dr Gala Romney >sm HH, multiple fundic gland polyps  . Glaucoma 2013   both eyes  . Hemorrhoids   . Hiatal hernia   . Hx of radiation therapy 07/17/10 to 07/21/10   SRS LLL lung  . Hyperlipidemia   . Insomnia   . Low back pain    scoliosis  . Lung cancer ([place]) 05/2010   Left 2011 - rad x 3  . Lymphedema    Left arm  . Osteoarthritis   . Osteopenia 10/25/2016  . Osteoporosis, senile   . Overactive bladder   . Pneumonia    RLL with sepsis  . PSVT (paroxysmal supraventricular tachycardia) ([place])   . Rheumatic fever  Age 44  . Right thyroid nodule   . Steatohepatitis    liver bx  . Type 2 diabetes mellitus ([place])     Past Surgical History:  Procedure Laterality Date  . ABDOMINAL HYSTERECTOMY  1964  . APPENDECTOMY  1964  . BIOPSY THYROID  11/2008  . BREAST BIOPSY     X 3 w/ cystectomy  . BREAST LUMPECTOMY  2001  . CATARACT EXTRACTION Bilateral 2003   Lens implant Dr. Charise Killian  . CHOLECYSTECTOMY  1965  . COLONOSCOPY  09/11/2011   Procedure: COLONOSCOPY;  Surgeon: Dorothyann Peng, MD;  [facility];  Service: Endoscopy;  Laterality: N/A;  . COLONOSCOPY  2005  /2012   Dr. Lucianne Muss- diverticulosis, ext hemorrhoids.  . COLONOSCOPY WITH PROPOFOL N/A 11/09/2017   Procedure: COLONOSCOPY WITH PROPOFOL;  Surgeon: Mauri Pole, MD;  Location: [facility];  Service: Endoscopy;  Laterality: N/A;  . COLONOSCOPY WITH PROPOFOL N/A 11/12/2017   Procedure: COLONOSCOPY WITH PROPOFOL;  Surgeon: Ladene Artist, MD;  Location: [facility];  Service: Endoscopy;  Laterality: N/A;  . CYSTOSCOPY  2007  . ESOPHAGOGASTRODUODENOSCOPY  09/11/2011   Procedure: ESOPHAGOGASTRODUODENOSCOPY (EGD);  Surgeon: Dorothyann Peng, MD;  [facility];  Service: Endoscopy;  Laterality: N/A;  . ESOPHAGOGASTRODUODENOSCOPY (EGD) WITH PROPOFOL N/A 11/08/2017   Procedure: ESOPHAGOGASTRODUODENOSCOPY (EGD) WITH PROPOFOL;  Surgeon: Mauri Pole, MD;  Location[facility];  Service: Endoscopy;  Laterality: N/A;  . LIVER BIOPSY  2008  . LUNG BIOPSY Left 06/06/2010  . REVISION TOTAL HIP ARTHROPLASTY  2007   Left  . SKIN CANCER EXCISION     nose and left lower cheek  . TONSILLECTOMY  1930  . TOTAL HIP ARTHROPLASTY Bilateral 2005 & 2007    Dr. Earl Lagos. Aline Brochure      reports that she has never smoked. She has never used smokeless tobacco. She reports that she does not drink alcohol and does not use drugs.  Allergies  Allergen Reactions  . Augmentin [Amoxicillin-Pot Clavulanate] Other (See Comments)    unknown  . Ibuprofen Hives  . Naproxen Other (See Comments)    Unknown Tears stomach up  . Nsaids     Tears stomach up  . Statins Other (See Comments)    Feels like knives in stomach  . Surgilube [Gyne-Moistrin]     Rectal itching, burning  . Tape Rash    Family History  Problem Relation Age of Onset  . Heart failure Mother   . Osteoporosis Mother   . Hypertension Father   . Stroke Father   . Heart failure Father   . Leukemia Brother   . Heart failure Brother   . Cancer Maternal Grandmother   . Stroke Maternal Grandfather   . Cancer Other      Prior to Admission medications   Medication Sig Start Date End Date Taking? Authorizing Provider  acetaminophen (TYLENOL) 325 MG tablet Take 650 mg by mouth in the morning, at noon, in the evening, and at bedtime.    Yes [provider]  atenolol (TENORMIN) 25 MG tablet Take 1 tablet (25 mg total) by mouth daily. 03/05/19  Yes [place] Eric J, DO  diclofenac Sodium (VOLTAREN) 1 % GEL Apply 2 g topically 4 (four) times daily as needed. Patient taking differently: Apply 2 g topically 4 (four) times daily as needed (pain).  07/29/20  Yes Elodia Florence., MD  DULoxetine (CYMBALTA) 30 MG capsule Take 1 capsule (30 mg total) by mouth daily. 03/05/19  Yes [place] Donnamarie Poag, DO  ferrous sulfate 220 (44 Fe) MG/5ML solution Take 220 mg by mouth daily. 5 mL by mouth daily with meal Once A Day   Yes [provider]  gabapentin (NEURONTIN) 100 MG capsule Take 1 capsule (100 mg total) by mouth 2 (two) times daily. 03/05/19  Yes [place] Donnamarie Poag, DO  Multiple Vitamins-Minerals (PRESERVISION AREDS 2) CAPS Take 1 capsule by mouth 2 (two) times daily.   Yes [provider]  mupirocin ointment (BACTROBAN) 2 % Apply 1 application topically daily as needed (nasal sores).  07/22/20  Yes [provider]  omeprazole (PRILOSEC) 20 MG capsule Take 1 capsule (20 mg total) by mouth daily. Patient taking differently: Take 20 mg by mouth 2 (two) times daily before a meal.  03/05/19  Yes [place] Eric J, DO  oxyCODONE (OXY IR/ROXICODONE) 5 MG immediate release tablet Take 0.5 tablets (2.5 mg total) by mouth every 6 (six) hours. Patient taking differently: Take 2.5 mg by mouth in the morning and at bedtime.  08/08/20  Yes Virgie Dad, MD  simethicone (MYLICON) 80 MG chewable tablet Chew 80 mg by mouth 4 (four) times daily as needed for flatulence.   Yes [provider]  sodium chloride 1 g tablet Take 1 g by mouth 2 (two) times daily with a meal.    Yes [provider]   latanoprost (XALATAN) 0.005 % ophthalmic solution Place 1 drop into both eyes at bedtime. 03/05/19   [place] Eric J, DO    Physical Exam:  Constitutional: Elderly female who appears to be in some discomfort. Vitals:   08/24/20 1321 08/24/20 1339 08/24/20 1400 08/24/20 1430  BP: (!) 124/57  113/64 123/60  Pulse: 69  69 73  Resp: 15  20 15   Temp:  97.7 F (36.5 C)    TempSrc:  Oral    SpO2: 93%  93% 94%  Weight:      Height:       Eyes: PERRL, lids and conjunctivae normal ENMT: Mucous membranes are dry. Posterior pharynx clear of any exudate or lesions.   Neck: normal, supple, no masses, no thyromegaly Respiratory: clear to auscultation bilaterally, no wheezing, no crackles. Normal respiratory effort. No accessory muscle use.  Cardiovascular: Regular rate and rhythm, no murmurs / rubs / gallops. No extremity edema. 2+ pedal pulses. No carotid bruits.  Abdomen: no tenderness, no masses palpated. No hepatosplenomegaly. Bowel sounds positive.  Musculoskeletal: no clubbing / cyanosis.  Decreased range of movement due to joint pain. Skin:  Pallor present.  Neurologic: CN 2-12 grossly intact. Sensation intact, DTR normal. Strength 5/5 in all 4.  Psychiatric: Normal judgment and insight. Alert and oriented x 3. Normal mood.     Labs on Admission: I have personally reviewed following labs and imaging studies  CBC: Recent Labs  Lab 08/24/20 1329  WBC 11.2*  NEUTROABS 8.9*  HGB 5.8*  HCT 17.6*  MCV 99.4  PLT 062   Basic Metabolic Panel: Recent Labs  Lab 08/24/20 1329  NA 130*  K 4.7  CL 97*  CO2 23  GLUCOSE 113*  BUN 46*  CREATININE 1.08*  CALCIUM 8.4*   GFR: Estimated Creatinine Clearance: 29 mL/min (A) (by C-G formula based on SCr of 1.08 mg/dL (H)). Liver Function Tests: Recent Labs  Lab 08/24/20 1329  AST 20  ALT 18  ALKPHOS 98  BILITOT 0.4  PROT 6.0*  ALBUMIN 2.9*   No results for input(s): LIPASE, AMYLASE in the last 168 hours.  No results for  input(s): AMMONIA in the last 168 hours. Coagulation Profile: Recent Labs  Lab 08/24/20 1329  INR 1.0   Cardiac Enzymes: No results for input(s): CKTOTAL, CKMB, CKMBINDEX, TROPONINI in the last 168 hours. BNP (last 3 results) No results for input(s): PROBNP in the last 8760 hours. HbA1C: No results for input(s): HGBA1C in the last 72 hours. CBG: No results for input(s): GLUCAP in the last 168 hours. Lipid Profile: No results for input(s): CHOL, HDL, LDLCALC, TRIG, CHOLHDL, LDLDIRECT in the last 72 hours. Thyroid Function Tests: No results for input(s): TSH, T4TOTAL, FREET4, T3FREE, THYROIDAB in the last 72 hours. Anemia Panel: No results for input(s): VITAMINB12, FOLATE, FERRITIN, TIBC, IRON, RETICCTPCT in the last 72 hours. Urine analysis:    Component Value Date/Time   COLORURINE STRAW (A) 07/26/2020 1525   APPEARANCEUR CLEAR 07/26/2020 1525   LABSPEC 1.002 (L) 07/26/2020 1525   PHURINE 8.0 07/26/2020 1525   GLUCOSEU NEGATIVE 07/26/2020 1525   HGBUR NEGATIVE 07/26/2020 1525   BILIRUBINUR NEGATIVE 07/26/2020 1525   KETONESUR NEGATIVE 07/26/2020 1525   PROTEINUR NEGATIVE 07/26/2020 1525   NITRITE NEGATIVE 07/26/2020 1525   LEUKOCYTESUR SMALL (A) 07/26/2020 1525   Sepsis Labs: No results found for this or any previous visit (from the past 240 hour(s)).   Radiological Exams on Admission: DG CHEST PORT 1 VIEW  Result Date: 08/24/2020 CLINICAL DATA:  GI bleed. EXAM: PORTABLE CHEST 1 VIEW COMPARISON:  Radiograph 07/27/2020.  CT 02/24/2019 FINDINGS: Chronic left pleural effusion is grossly stable from prior radiograph. Associated basilar opacities likely atelectasis. Stable heart size and mediastinal contours. Aortic atherosclerosis. No pulmonary edema. No pneumothorax. Chronic change about the right shoulder. No acute osseous abnormalities are seen. IMPRESSION: Chronic left pleural effusion and basilar atelectasis, grossly stable from prior radiograph. No acute findings.  Electronically Signed   By: Keith Rake M.D.   On: 08/24/2020 15:46    Chest x-ray: Independently reviewed.   left-sided pleural effusion  Assessment/Plan Acute blood loss anemia secondary to GI bleed: Patient presents with drops in her hemoglobin down to 5.8 on admission.  Hemoglobin had previously been 10.8 on 8/25.  Stool guaiacs were noted to be positive.  The elevated BUN to creatinine ratio suggest possibility of an upper GI bleed.  Patient had been given Pepcid 20 mg IV x1 dose and ordered to be transfused 1 unit of PRBCs. -Admit to a medical telemetry bed -N.p.o. for now -Change transfusion to 2 units of PRBCs -Will start Protonix drip for concern for upper GI bleed -Check CT angiogram of the abdomen and pelvis for any signs of overt bleeding -Continue to monitor H&H and transfuse blood products as needed to maintain hemoglobin of at least 8 g/dL -Appreciate GI consultative services, we will follow-up for any further recommendations.  Leukocytosis: Acute.  WBC elevated 11.2.  Suspect this is likely due to acute GI bleed.  -Continue to monitor   Paroxysmal SVT: Patient appears to be in sinus rhythm at this time.  Home medications include atenolol 25 mg daily. -Continue atenolol as tolerated  Hyponatremia: Chronic.  Patient baseline sodium appears to be around 130.  She is on sodium chloride tablets 1 g twice daily with meals. -Continue sodium tablets 1 g twice daily with meals.  Right hip pain -Continue oxycodone  Protein calorie malnutrition: On admission patient noted to have a albumin 2.9. -Add-on prealbumin  Depression -Continue Cymbalta  DNR: Present on admission.  DVT prophylaxis: SCDs Code Status: DNR Family Communication: Son  Updated at bedside Disposition Plan: To be determined Consults called: GI Admission status: Inpatient  Norval Morton MD [facility] Pager [phone]   If 7PM-7AM, please contact  night-coverage [email or web address] Password [facility]  08/24/2020, 2:48 PM

## 2020-08-25 DIAGNOSIS — K625 Hemorrhage of anus and rectum: Secondary | ICD-10-CM

## 2020-08-25 DIAGNOSIS — R799 Abnormal finding of blood chemistry, unspecified: Secondary | ICD-10-CM

## 2020-08-25 LAB — BASIC METABOLIC PANEL
Anion gap: 7 (ref 5–15)
BUN: 44 mg/dL — ABNORMAL HIGH (ref 8–23)
CO2: 23 mmol/L (ref 22–32)
Calcium: 7.8 mg/dL — ABNORMAL LOW (ref 8.9–10.3)
Chloride: 101 mmol/L (ref 98–111)
Creatinine, Ser: 0.88 mg/dL (ref 0.44–1.00)
GFR calc Af Amer: >60 mL/min (ref >60)
GFR calc non Af Amer: 55 mL/min — ABNORMAL LOW (ref >60)
Glucose, Bld: 89 mg/dL (ref 70–99)
Potassium: 4.4 mmol/L (ref 3.5–5.1)
Sodium: 131 mmol/L — ABNORMAL LOW (ref 135–145)

## 2020-08-25 LAB — PREPARE RBC (CROSSMATCH)

## 2020-08-25 LAB — HEMOGLOBIN AND HEMATOCRIT, BLOOD
HCT: 19.7 % — ABNORMAL LOW (ref 36.0–46.0)
HCT: 22.8 % — ABNORMAL LOW (ref 36.0–46.0)
Hemoglobin: 6.5 g/dL — CL (ref 12.0–15.0)
Hemoglobin: 7.6 g/dL — ABNORMAL LOW (ref 12.0–15.0)

## 2020-08-25 LAB — CBC
HCT: 23.6 % — ABNORMAL LOW (ref 36.0–46.0)
Hemoglobin: 7.8 g/dL — ABNORMAL LOW (ref 12.0–15.0)
MCH: 32 pg (ref 26.0–34.0)
MCHC: 33.1 g/dL (ref 30.0–36.0)
MCV: 96.7 fL (ref 80.0–100.0)
Platelets: 189 10*3/uL (ref 150–400)
RBC: 2.44 MIL/uL — ABNORMAL LOW (ref 3.87–5.11)
RDW: 15.3 % (ref 11.5–15.5)
WBC: 9.5 10*3/uL (ref 4.0–10.5)
nRBC: 0 % (ref 0.0–0.2)

## 2020-08-25 MED ORDER — SODIUM CHLORIDE 0.9% IV SOLUTION: Freq: Once | INTRAVENOUS | Status: DC

## 2020-08-25 MED ORDER — SODIUM CHLORIDE 0.9 % IV SOLN: INTRAVENOUS | Status: AC

## 2020-08-25 MED ORDER — MORPHINE SULFATE (PF) 2 MG/ML IV SOLN
0.5000 mg | INTRAVENOUS | Status: DC | PRN
  Administered 2020-08-26 – 2020-08-27 (×5): 0.5 mg via INTRAVENOUS
  Filled 2020-08-25 (×4): qty 1

## 2020-08-25 MED ORDER — DIPHENHYDRAMINE HCL 25 MG PO CAPS
25.0000 mg | ORAL_CAPSULE | Freq: Once | ORAL | Status: AC
  Administered 2020-08-25: 25 mg via ORAL
  Filled 2020-08-25: qty 1

## 2020-08-25 MED ORDER — ACETAMINOPHEN 325 MG PO TABS
650.0000 mg | ORAL_TABLET | Freq: Once | ORAL | Status: AC
  Administered 2020-08-25: 650 mg via ORAL
  Filled 2020-08-25: qty 2

## 2020-08-25 MED ORDER — FUROSEMIDE 10 MG/ML IJ SOLN
20.0000 mg | Freq: Once | INTRAMUSCULAR | Status: AC
  Filled 2020-08-25: qty 2

## 2020-08-25 MED ORDER — SODIUM CHLORIDE 0.9 % IV SOLN
8.0000 mg/h | INTRAVENOUS | Status: DC
  Administered 2020-08-25 – 2020-08-27 (×6): 8 mg/h via INTRAVENOUS
  Filled 2020-08-25 (×8): qty 80

## 2020-08-25 MED ORDER — SODIUM CHLORIDE 0.9 % IV BOLUS
500.0000 mL | Freq: Once | INTRAVENOUS | Status: AC
  Administered 2020-08-25: 500 mL via INTRAVENOUS

## 2020-08-25 NOTE — ED Notes (Signed)
Report given to Jennifer, RN

## 2020-08-25 NOTE — ED Notes (Signed)
Lab called and has 2 units of blood ready.

## 2020-08-25 NOTE — TOC Initial Note (Addendum)
Transition of Care [facility]) - Initial/Assessment Note    Patient Details  Name: [name] MRN: [id number] Date of Birth: [birth year]  Transition of Care [facility]) CM/SW Contact:    Erenest Rasher, RN Phone Number:  [phone] 08/25/2020, 5:38 PM  Clinical Narrative:                 TOC CM spoke to pt and gave permission to speak to son, Mia Creek. Mia Creek is in agreement pt to go back to her ALF and if needed to seek rehab at facility. [facility] [place]. They do have referral for Palliative with [facility]. They did not get a chance to arrange as pt had to admitted to hospital. They do have a rehab bed available if pt needs rehab at the facility.    Expected Discharge Plan: Skilled Nursing Facility Barriers to Discharge: Continued Medical Work up   Patient Goals and CMS Choice Patient states their goals for this hospitalization and ongoing recovery are:: would like a rehab if needed CMS Medicare.gov Compare Post Acute Care list provided to:: Patient Represenative (must comment) Choice offered to / list presented to : Adult Children  Expected Discharge Plan and Services Expected Discharge Plan: [facility] In-house Referral: Clinical Social Work   Post Acute Care Choice: [place] Living arrangements for the past 2 months: [place]                                      Prior Living Arrangements/Services Living arrangements for the past 2 months: [place] Lives with:: Facility Resident Patient language and need for interpreter reviewed:: Yes Do you feel safe going back to the place where you live?: Yes      Need for Family Participation in Patient Care: Yes (Comment) Care giver support system in place?: Yes (comment) Current home services: DME (electric scooter) Criminal Activity/Legal Involvement Pertinent to Current Situation/Hospitalization: No - Comment as needed  Activities  of Daily Living Home Assistive Devices/Equipment: Wheelchair, Eyeglasses, Hearing aid (bilateral hearing aides) ADL Screening (condition at time of admission) Patient's cognitive ability adequate to safely complete daily activities?: Yes Is the patient deaf or have difficulty hearing?: Yes (bilateral hearing aides) Does the patient have difficulty seeing, even when wearing glasses/contacts?: Yes (macular degeneration) Does the patient have difficulty concentrating, remembering, or making decisions?: No Patient able to express need for assistance with ADLs?: Yes Does the patient have difficulty dressing or bathing?: Yes Independently performs ADLs?: No Communication: Independent Dressing (OT): Needs assistance Is this a change from baseline?: Pre-admission baseline Grooming: Independent Feeding: Independent Bathing: Needs assistance Is this a change from baseline?: Pre-admission baseline Toileting: Needs assistance Is this a change from baseline?: Pre-admission baseline In/Out Bed: Needs assistance Is this a change from baseline?: Pre-admission baseline Walks in Home: Dependent Is this a change from baseline?: Pre-admission baseline Does the patient have difficulty walking or climbing stairs?: Yes Weakness of Legs: Both Weakness of Arms/Hands: Both  Permission Sought/Granted Permission sought to share information with : Case Manager, Customer service manager, PCP, Family Supports Permission granted to share information with : Yes, Verbal Permission Granted  Share Information with NAME: Harlo Jaso  Permission granted to share info w AGENCY: [place] granted to share info w Relationship: son  Permission granted to share info w Contact Information: [phone]  Emotional Assessment  Orientation: : Oriented to Self, Oriented to Place, Oriented to  Time, Oriented to Situation   Psych Involvement: No (comment)  Admission diagnosis:  GI bleed  [K92.2] Patient Active Problem List   Diagnosis Date Noted  . GI bleed 08/24/2020  . Leukocytosis 08/24/2020  . DNR (do not resuscitate) 08/24/2020  . Anemia due to GI blood loss   . Right-sided chest wall pain 08/01/2020  . Cellulitis of right toe 08/01/2020  . Pleural effusion on left 08/01/2020  . Right hip pain 07/13/2020  . T2DM (type 2 diabetes mellitus) ([place]) 03/16/2020  . Postmenopausal symptoms 03/16/2020  . CKD (chronic kidney disease) stage 3, GFR 30-59 ml/min 07/29/2019  . Pain in gums 07/22/2019  . Amaurosis fugax of right eye 01/15/2019  . Glaucoma 01/14/2019  . Macular degeneration 01/14/2019  . Chronic diastolic CHF (congestive heart failure) ([place]) 08/13/2018  . Pulmonary hypertension, primary ([place]) 07/31/2018  . Neuropathy 06/20/2018  . Paroxysmal atrial fibrillation ([place]) 06/20/2018  . Iron deficiency 01/21/2018  . Fall 01/09/2018  . Protein calorie malnutrition ([place]) 12/05/2017  . Advanced care planning/counseling discussion 11/20/2017  . Melena   . Diverticulosis of colon with hemorrhage   . Acute blood loss anemia   . Thrush 04/15/2017  . Debility 04/03/2017  . Hyponatremia 07/09/2015  . Hearing loss of both ears 04/08/2014  . HTN (hypertension) 03/27/2014  . Slow transit constipation 03/27/2014  . Skin lesion of face 03/27/2014  . Rotator cuff syndrome of right shoulder 09/30/2012  . Breast cancer ([place])   . Lung cancer, lower lobe ([place]) 10/25/2011  . PSVT (paroxysmal supraventricular tachycardia) ([place]) 03/29/2011  . INTERDIGITAL NEUROMA 03/28/2010  . IMPINGEMENT SYNDROME 03/28/2010  . THYROID NODULE, RIGHT 09/06/2008  . LEG EDEMA, BILATERAL 12/22/2007  . Depression with anxiety 07/31/2007  . Other specified disorders of adrenal gland ([place]) 05/14/2007  . Hyperlipidemia 01/28/2007  . Blood loss anemia 01/28/2007  . Allergic rhinitis 01/28/2007  . GERD (gastroesophageal reflux disease) 01/28/2007  . OVERACTIVE BLADDER 01/28/2007  . Osteoarthritis  01/28/2007  . Pain in lower back 01/28/2007  . Osteoporosis 01/28/2007   PCP:  Virgie Dad, MD Pharmacy:   [facility], [address] [place] [address] Phone: [phone] Fax: [facility], [place] [address] [place] [address] Phone: [phone] Fax: [phone]     Social Determinants of Health (SDOH) Interventions    Readmission Risk Interventions No flowsheet data found.

## 2020-08-25 NOTE — Progress Notes (Signed)
long discussion with patient and family member her son at the bedside  She has had recurrent bleeding this afternoon and her hemoglobin has dropped so she will be transfused 2 more units of PRBCs I discussed gastroenterologists plan if she continues to bleed of potentially repeating a CTA-this would be "middle-of-the-road" type of intervention in terms of possibly having IR embolize what ever bleeding vessel is present if that is found I have once again reiterated to her with her son present that we do not need to do ANYTHING specific and that going back to her facility with hospice is perfectly acceptable if that is what she elects  We will temporize her with blood transfusion and allow for some more time to make a decision about further management strategies whether curative or hospice related-I explained to her the philosophy of hospice and explained that we would not let her suffer and that we would allow her dignity at end-of-life in addition to symptom control with pain management and anxiolytics  We will revisit this discussion tomorrow  Verneita Griffes, MD [facility] 5:18 PM

## 2020-08-25 NOTE — Progress Notes (Signed)
Gastroenterology Inpatient Follow-up Note   PATIENT IDENTIFICATION  [name] is a 86 y.o. female with a pmh significant for breast cancer, MDD, diverticulosis (prior diverticular hemorrhage), hemorrhoids, hiatal hernia, osteoporosis.  The patient presents with symptomatic acute blood loss anemia query recurrent diverticular versus possibility of upper GI bleed (with elevated BUN in setting of recent NSAID use).  There is some sort of communication problem because it is not he simply "I reset the codes and is not excepting that the probation been sent Hospital Day: 2  SUBJECTIVE  The patient is evaluated in her room this morning. She is not on any IV fluids. She recounts having multiple bowel movements over the course of the evening that come without notice.  She ends up having increased amounts of flatus. The patient has not had anything by mouth. She has a very significant dry mouth and wants to drink something at a minimum if not go ahead and eat. Patient states that she is ready to die no matter what she just does not believe her family is ready for her to pass. Patient describes no abdominal pain or discomfort. No fevers or chills. Hemoglobin has trended upwards after packed RBC transfusion.   OBJECTIVE  Scheduled Inpatient Medications:  . acetaminophen  650 mg Oral TID  . atenolol  25 mg Oral Daily  . DULoxetine  30 mg Oral Daily  . ferrous sulfate  300 mg Oral Q breakfast  . gabapentin  100 mg Oral BID  . oxyCODONE  2.5 mg Oral BID  . pantoprazole  40 mg Intravenous Q12H  . sodium chloride  1 g Oral BID WC   Continuous Inpatient Infusions:  . sodium chloride Stopped (08/24/20 1922)   PRN Inpatient Medications: acetaminophen **OR** acetaminophen, albuterol, diclofenac Sodium, ondansetron **OR** ondansetron (ZOFRAN) IV, simethicone   Physical Examination  Temp:  [97.6 F (36.4 C)-98.6 F (37 C)] 98.3 F (36.8 C) (09/09 0641) Pulse Rate:  [57-77] 64 (09/09  0730) Resp:  [11-21] 19 (09/09 0730) BP: (102-133)/(45-72) 108/45 (09/09 0730) SpO2:  [93 %-100 %] 96 % (09/09 0730) Weight:  [69 kg] 69 kg (09/08 1258) Temp (24hrs), Avg:98.1 F (36.7 C), Min:97.6 F (36.4 C), Max:98.6 F (37 C)  Weight: 69 kg GEN: NAD, appears stated age, doesn't appear chronically ill  PSYCH: Cooperative, without pressured speech EYE: Conjunctivae pale pink ENT: Dry mucous membranes CV: Nontachycardic RESP: No audible wheezing GI: Hyperactive bowel sounds, without rebound or guarding SKIN: No jaundice NEURO:  Alert & Oriented x 3, no focal deficits   Review of Data   Laboratory Studies   Recent Labs  Lab 08/25/20 0424  NA 131*  K 4.4  CL 101  CO2 23  BUN 44*  CREATININE 0.88  GLUCOSE 89  CALCIUM 7.8*   Recent Labs  Lab 08/24/20 1329  AST 20  ALT 18  ALKPHOS 98    Recent Labs  Lab 08/24/20 1329 08/24/20 1329 08/25/20 0424  WBC 11.2*   < > 9.5  HGB 5.8*   < > 7.8*  HCT 17.6*   < > 23.6*  PLT 211  --  189   < > = values in this interval not displayed.   Recent Labs  Lab 08/24/20 1329  APTT 29  INR 1.0    Imaging Studies  DG CHEST PORT 1 VIEW  Result Date: 08/24/2020 CLINICAL DATA:  GI bleed. EXAM: PORTABLE CHEST 1 VIEW COMPARISON:  Radiograph 07/27/2020.  CT 02/24/2019 FINDINGS: Chronic left pleural effusion  is grossly stable from prior radiograph. Associated basilar opacities likely atelectasis. Stable heart size and mediastinal contours. Aortic atherosclerosis. No pulmonary edema. No pneumothorax. Chronic change about the right shoulder. No acute osseous abnormalities are seen. IMPRESSION: Chronic left pleural effusion and basilar atelectasis, grossly stable from prior radiograph. No acute findings. Electronically Signed   By: Keith Rake M.D.   On: 08/24/2020 15:46   CT Angio Abd/Pel w/ and/or w/o  Result Date: 08/24/2020 CLINICAL DATA:  86 year old with GI bleed. EXAM: CTA ABDOMEN AND PELVIS WITHOUT AND WITH CONTRAST  TECHNIQUE: Multidetector CT imaging of the abdomen and pelvis was performed using the standard protocol during bolus administration of intravenous contrast. Multiplanar reconstructed images and MIPs were obtained and reviewed to evaluate the vascular anatomy. CONTRAST:  88m OMNIPAQUE IOHEXOL 350 MG/ML SOLN COMPARISON:  None. FINDINGS: VASCULAR Aorta: Moderate calcified atheromatous plaque. No aneurysm. No dissection or acute findings. No significant stenosis. Celiac: Plaque at the origin causes mild stenosis. Distal branch vessels are patent. No dissection. SMA: Patent without evidence of aneurysm, dissection, vasculitis or significant stenosis. There is a replaced right hepatic artery arising from the SMA. Renals: Single bilateral renal arteries are patent. Mild atherosclerosis without significant stenosis. No dissection or acute findings. IMA: Patent. Inflow: Tortuous and moderately calcified. No dissection, aneurysm, or significant stenosis. Proximal Outflow: Partially obscured by streak artifact from bilateral hip arthroplasties. No severe stenosis or acute findings. Veins: Venous phase imaging demonstrates patency of the portal and hepatic veins. There is no evidence of mesenteric venous thrombus. IVC is unremarkable. Review of the MIP images confirms the above findings. NON-VASCULAR Lower chest: Small to moderate left pleural effusion and adjacent compressive atelectasis. Normal scarring in the medial right middle lobe. Hepatobiliary: No focal hepatic abnormality. Mild biliary dilatation post cholecystectomy. No visualized choledocholithiasis. Pancreas: No ductal dilatation or inflammation. Spleen: Small in size without focal abnormality. Adrenals/Urinary Tract: No adrenal nodule. There is thinning of bilateral renal parenchyma. No hydronephrosis. No significant perinephric edema. Urinary bladder is grossly unremarkable, partially obscured by streak artifact from bilateral hip arthroplasties. Stomach/Bowel:  No accumulation of contrast within the GI tract to localize site of GI bleed. There is high-density ingested material within sigmoid colonic diverticula present on both pre and postcontrast exams. Portions of pelvic bowel loops are partially obscured by streak artifact from bilateral hip arthroplasties. The stomach is nondistended, equivocal wall thickening about the greater curvature versus nondistention. There is a duodenal diverticulum that contains high-density ingested material, also present on both pre and post contrast exam. There is no bowel wall thickening or bowel inflammation. Colonic diverticulosis is most prominent in the sigmoid colon. No diverticulitis. Is colonic interposition under the right hemidiaphragm. Cecum is high-riding. Lymphatic: No bulky abdominopelvic adenopathy. Reproductive: Uterus not visualized.  No adnexal mass. Other: No ascites or free air. Tiny fat containing umbilical hernia. Musculoskeletal: Bilateral hip arthroplasties. Diffuse degenerative change in the spine. Remote left pubic rami fractures. Bones diffusely under mineralized. IMPRESSION: 1. No accumulation of contrast within the GI tract to localize site of GI bleed. 2. Equivocal gastric wall thickening versus nondistention. Colonic diverticulosis without diverticulitis. Duodenal diverticulum. 3. Small to moderate left pleural effusion and adjacent compressive atelectasis. Aortic Atherosclerosis (ICD10-I70.0). Electronically Signed   By: MKeith RakeM.D.   On: 08/24/2020 21:14    ASSESSMENT  Ms. GGuarinois a 86y.o. female with a pmh significant for breast cancer, MDD, diverticulosis (prior diverticular hemorrhage), hemorrhoids, hiatal hernia, osteoporosis.  The patient presents with symptomatic acute blood loss anemia  query recurrent diverticular versus possibility of upper GI bleed (with elevated BUN in setting of recent NSAID use).  At this moment, the patient is stable hemodynamically.  She continues to have  blood over the course of the evening into this morning come out.  With that being said her creatinine has normalized however her BUN remains very elevated.  She had significant NSAID use over the course the last week or so in the setting of discomfort and pain that she had been in.  I have a very high concern that this is an upper source of GI bleeding although she seems to be stable having received at least a bit of the IV Protonix yesterday.  She is not on the Protonix drip at this time.  Hemoglobin is stable but I am not convinced that this was truly all diverticular in origin.  CT angiogram was negative at this time.  The patient is deferring any sort of endoscopic evaluation.  At her age I certainly understand her concerns but we will have to try to support her as best we can with Protonix and IV fluids.  I would wait on advancing her diet more than clear liquids at this time because in case something more urgent develops or she becomes hemodynamically unstable then we will need to consider endoscopy versus tagged RBC versus continuing to just support her with blood products.  Urinalysis at outside facility per the patient's son had shown a UTI but she has not been on antibiotics such as nitrofurantoin or Bactrim which could lead to BUN elevations but certainly reasonable to repeat a urinalysis.  I have ordered that.  The medical service can work on trying to obtain the outside records for further review.  The patient and son understand that if we continue to support the patient but do not act more quickly that our abilities to have anesthesia services or other types of interventions may be complicated should she need them later in the day but we certainly will respect their wishes.  All patient questions were answered to the best of my ability, and the patient agrees to the aforementioned plan of action with follow-up as indicated.   PLAN/RECOMMENDATIONS  Reorder Protonix infusion and plan to continue for  at least 72 hours Clear liquid diet Strongly recommended EGD but patient defers on this and patient's family understands risks associated with just supportive measures Trend hemoglobin/hematocrit every 8 to 12 hours Urinalysis ordered but medical service can work on trying obtain outside urinalysis and records to see what type of bacteria may have grown in the reported urinary specimen provided as an outpatient If patient becomes hemodynamically unstable then will consider the role of endoscopy after she is supported and optimized versus a tagged RBC scan CT angio could be considered as well  Please page/call with questions or concerns.   Justice Britain, MD [facility] Office # [phone]    LOS: 1 day  Irving Copas  08/25/2020, 7:53 AM

## 2020-08-25 NOTE — ED Notes (Signed)
Patient had an episode of bowel incontinence, cleaned up and new linens provided. Patient repositioned. Bowel movement was deep red, no stool noted, only thick blood.

## 2020-08-25 NOTE — Progress Notes (Signed)
PROGRESS NOTE    [name]  MAU:[id number] DOB: [birth year] DOA: 08/24/2020 PCP: Virgie Dad, MD  Brief Narrative:  86 year old [facility] resident History of PSVT with HFpEF, chronic hyponatremia, prior breast and lung cancer with chronic left pleural effusion, depression with anxiety on Cymbalta, chronic right hip pain status post bilateral arthroplasty in the past (has had multiple steroid injections and trochanteric bursa 07/20/2020) neuropathy  Hospitalized 8/10 through 8/13 2/2 hyponatremia sodium 120-T toast potomania?  Tramadol-low cortisol at the time-was discharged on free water fluid extubated no  Appears was started on meloxicam 8/31 for acute superimposed on chronic hip pain as could not tolerate even low doses of oxycodone-she had an MRI of the hip and back region to find the cause of her pain  Seen subsequently at nursing home secondary to feeling "weak" hemoglobin had dropped to 7 and subsequently on 9/8 had dropped to 5 and she was referred over after her own reluctance for work-up and blood transfusion GI was consulted and recommended a CT angiogram that was performed on 9/8 that showed no active bleed-patient expressed reluctance to do any type of scope and wishes to be DNR    Assessment & Plan:   Principal Problem:   GI bleed Active Problems:   PSVT (paroxysmal supraventricular tachycardia) (HCC)   Acute blood loss anemia   Protein calorie malnutrition (HCC)   Right hip pain   Leukocytosis   DNR (do not resuscitate)   1. Acute anemia of blood loss 2/2 upper GI bleed most likely a. Etiology unclear at this time and patient declines option of doing any type of intervention b. Her hemoglobin has dropped steadily.today and she will be transfused another 2 units of PRBC c. Would only keep on diet as per GI and would continue Protonix GTT and rest her GI tract d. I had a long discussion with her as below 2. PSVT a. We will give her atenolol 25 mg if she  can tolerate this with a sip b. If not we will transition to IV metoprolol in the next day or so c. On monitor she is sinus bradycardia to sinus 3. ?  Question of UTI a. She has no burning in urine no leukocytosis so I will not repeat UA b. I have canceled her urine culture 4. Chronic right hip pain in the setting of prior injections a. Difficult situation-tells me that she had good relief with meloxicam b. I will transition her low-dose oxycodone to very low-dose IV morphine 0.5 mg every 3 to every 4 5. Prior breast and lung cancer with chronic left pleural effusion 6. Bipolar on Cymbalta a. I will discontinue her Cymbalta at this time b. If needed we will start low-dose Ativan 7. 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 She is not eating at this time as she is n.p.o. b. IV fluids as per orders 8. HFpEF compensated 9. DNR status a. I had an extensive conversation with the patient at the bedside and her younger son was present at that time b. Patient is not depressed and claims not to have depression after screening PHQ 2 c. She is in her right mind and can make these decisions competently and cognitively and has told me that even before her severe hip pains as well as her other issues that started in August, she has been feeling that it would be better for her to "go to God" d. I will be respectful of her wishes honor her DNR and probably  we will with her permission and with verification from palliative care once you see her switch her to hospice-we have afforded patient some time to think about this, and I contrasted the need for further blood in addition to interventions as being incompatible e. She clearly understands that blood is also a life-saving measure and that we do need further clarification of how she would want to proceed as we cannot continue to transfuse her without finding a source and she is understanding of the same  DVT prophylaxis: SCD Code Status: DNR confirmed at the  bedside Family Communication: Discussed with the younger son in detail at the bedside Disposition:   Status is: Inpatient  Remains inpatient appropriate because:Hemodynamically unstable and IV treatments appropriate due to intensity of illness or inability to take PO   Dispo: The patient is from: SNF              Anticipated d/c is to: SNF              Anticipated d/c date is: 2 days              Patient currently is not medically stable to d/c.       Consultants:   GI  Palliative care  Procedures: Multiple  Antimicrobials: None   Subjective: Doing well but has had several loose dark stools today Diverticulitis and myself and they have stained the blankets and do not appear to have much stool in them and looks like frank blood No chest pain No vomiting of blood  Objective: Vitals:   08/25/20 0641 08/25/20 0730 08/25/20 0800 08/25/20 0804  BP: (!) 115/50 (!) 108/45 (!) 121/45 (!) 121/45  Pulse: 64 64 63 63  Resp: 14 19 14 12   Temp: 98.3 F (36.8 C)     TempSrc: Oral     SpO2: 94% 96% 94% 98%  Weight:      Height:        Intake/Output Summary (Last 24 hours) at 08/25/2020 2671 Last data filed at 08/25/2020 2458 Gross per 24 hour  Intake 995 ml  Output 400 ml  Net 595 ml   Filed Weights   08/24/20 1258  Weight: 69 kg    Examination:  General exam: Awake alert coherent no distress no icterus no pallor looks much younger than stated age Respiratory system: Chest clear Cardiovascular system: S1-S2 murmur possible in left upper sternal edge sinus rhythm on monitors Gastrointestinal system: Soft no rebound no guarding. Central nervous system: Intact cognizant sensory intact motor intact Extremities: No lower extremity edema ROM of joints intact Skin: No lower extremity edema as above Psychiatry: Euthymic pleasant does not appear depressed  Data Reviewed: I have personally reviewed following labs and imaging studies Sodium now 131 BUN/creatinine 44/0.88  (baseline 25/0.8) Hemoglobin after 2 units 5.8-->7.8 White count 11.2-->9.5   Radiology Studies: DG CHEST PORT 1 VIEW  Result Date: 08/24/2020 CLINICAL DATA:  GI bleed. EXAM: PORTABLE CHEST 1 VIEW COMPARISON:  Radiograph 07/27/2020.  CT 02/24/2019 FINDINGS: Chronic left pleural effusion is grossly stable from prior radiograph. Associated basilar opacities likely atelectasis. Stable heart size and mediastinal contours. Aortic atherosclerosis. No pulmonary edema. No pneumothorax. Chronic change about the right shoulder. No acute osseous abnormalities are seen. IMPRESSION: Chronic left pleural effusion and basilar atelectasis, grossly stable from prior radiograph. No acute findings. Electronically Signed   By: Keith Rake M.D.   On: 08/24/2020 15:46   CT Angio Abd/Pel w/ and/or w/o  Result Date: 08/24/2020 CLINICAL DATA:  86 year old with GI bleed. EXAM: CTA ABDOMEN AND PELVIS WITHOUT AND WITH CONTRAST TECHNIQUE: Multidetector CT imaging of the abdomen and pelvis was performed using the standard protocol during bolus administration of intravenous contrast. Multiplanar reconstructed images and MIPs were obtained and reviewed to evaluate the vascular anatomy. CONTRAST:  67m OMNIPAQUE IOHEXOL 350 MG/ML SOLN COMPARISON:  None. FINDINGS: VASCULAR Aorta: Moderate calcified atheromatous plaque. No aneurysm. No dissection or acute findings. No significant stenosis. Celiac: Plaque at the origin causes mild stenosis. Distal branch vessels are patent. No dissection. SMA: Patent without evidence of aneurysm, dissection, vasculitis or significant stenosis. There is a replaced right hepatic artery arising from the SMA. Renals: Single bilateral renal arteries are patent. Mild atherosclerosis without significant stenosis. No dissection or acute findings. IMA: Patent. Inflow: Tortuous and moderately calcified. No dissection, aneurysm, or significant stenosis. Proximal Outflow: Partially obscured by streak artifact from  bilateral hip arthroplasties. No severe stenosis or acute findings. Veins: Venous phase imaging demonstrates patency of the portal and hepatic veins. There is no evidence of mesenteric venous thrombus. IVC is unremarkable. Review of the MIP images confirms the above findings. NON-VASCULAR Lower chest: Small to moderate left pleural effusion and adjacent compressive atelectasis. Normal scarring in the medial right middle lobe. Hepatobiliary: No focal hepatic abnormality. Mild biliary dilatation post cholecystectomy. No visualized choledocholithiasis. Pancreas: No ductal dilatation or inflammation. Spleen: Small in size without focal abnormality. Adrenals/Urinary Tract: No adrenal nodule. There is thinning of bilateral renal parenchyma. No hydronephrosis. No significant perinephric edema. Urinary bladder is grossly unremarkable, partially obscured by streak artifact from bilateral hip arthroplasties. Stomach/Bowel: No accumulation of contrast within the GI tract to localize site of GI bleed. There is high-density ingested material within sigmoid colonic diverticula present on both pre and postcontrast exams. Portions of pelvic bowel loops are partially obscured by streak artifact from bilateral hip arthroplasties. The stomach is nondistended, equivocal wall thickening about the greater curvature versus nondistention. There is a duodenal diverticulum that contains high-density ingested material, also present on both pre and post contrast exam. There is no bowel wall thickening or bowel inflammation. Colonic diverticulosis is most prominent in the sigmoid colon. No diverticulitis. Is colonic interposition under the right hemidiaphragm. Cecum is high-riding. Lymphatic: No bulky abdominopelvic adenopathy. Reproductive: Uterus not visualized.  No adnexal mass. Other: No ascites or free air. Tiny fat containing umbilical hernia. Musculoskeletal: Bilateral hip arthroplasties. Diffuse degenerative change in the spine. Remote  left pubic rami fractures. Bones diffusely under mineralized. IMPRESSION: 1. No accumulation of contrast within the GI tract to localize site of GI bleed. 2. Equivocal gastric wall thickening versus nondistention. Colonic diverticulosis without diverticulitis. Duodenal diverticulum. 3. Small to moderate left pleural effusion and adjacent compressive atelectasis. Aortic Atherosclerosis (ICD10-I70.0). Electronically Signed   By: MKeith RakeM.D.   On: 08/24/2020 21:14     Scheduled Meds: . sodium chloride   Intravenous Once  . acetaminophen  650 mg Oral TID  . acetaminophen  650 mg Oral Once  . atenolol  25 mg Oral Daily  . diphenhydrAMINE  25 mg Oral Once  . furosemide  20 mg Intravenous Once  . sodium chloride  1 g Oral BID WC   Continuous Infusions: . sodium chloride Stopped (08/24/20 1922)  . sodium chloride 75 mL/hr at 08/25/20 1018  . pantoprozole (PROTONIX) infusion 8 mg/hr (08/25/20 1458)     LOS: 1 day    Time spent: 5[place] MD [facility] To contact the attending provider between 7A-7P or the  covering provider during after hours 7P-7A, please log into the web site [email or web address] and access using universal [place] password for that web site. If you do not have the password, please call the hospital operator.  08/25/2020, 8:33 AM

## 2020-08-26 ENCOUNTER — Encounter (HOSPITAL_COMMUNITY): Payer: Self-pay | Admitting: Internal Medicine

## 2020-08-26 ENCOUNTER — Inpatient Hospital Stay (HOSPITAL_COMMUNITY): Payer: Medicare HMO

## 2020-08-26 DIAGNOSIS — Z7189 Other specified counseling: Secondary | ICD-10-CM

## 2020-08-26 DIAGNOSIS — Z515 Encounter for palliative care: Secondary | ICD-10-CM

## 2020-08-26 DIAGNOSIS — D62 Acute posthemorrhagic anemia: Secondary | ICD-10-CM

## 2020-08-26 LAB — CBC WITH DIFFERENTIAL/PLATELET
Abs Immature Granulocytes: 0.09 10*3/uL — ABNORMAL HIGH (ref 0.00–0.07)
Abs Immature Granulocytes: 0.15 10*3/uL — ABNORMAL HIGH (ref 0.00–0.07)
Basophils Absolute: 0 10*3/uL (ref 0.0–0.1)
Basophils Absolute: 0 10*3/uL (ref 0.0–0.1)
Basophils Relative: 0 %
Basophils Relative: 0 %
Eosinophils Absolute: 0.2 10*3/uL (ref 0.0–0.5)
Eosinophils Absolute: 0.3 10*3/uL (ref 0.0–0.5)
Eosinophils Relative: 2 %
Eosinophils Relative: 2 %
HCT: 32.8 % — ABNORMAL LOW (ref 36.0–46.0)
HCT: 27.3 % — ABNORMAL LOW (ref 36.0–46.0)
Hemoglobin: 11 g/dL — ABNORMAL LOW (ref 12.0–15.0)
Hemoglobin: 9.1 g/dL — ABNORMAL LOW (ref 12.0–15.0)
Immature Granulocytes: 1 %
Immature Granulocytes: 1 %
Lymphocytes Relative: 11 %
Lymphocytes Relative: 8 %
Lymphs Abs: 1.2 10*3/uL (ref 0.7–4.0)
Lymphs Abs: 1.1 10*3/uL (ref 0.7–4.0)
MCH: 32.2 pg (ref 26.0–34.0)
MCH: 31.8 pg (ref 26.0–34.0)
MCHC: 33.5 g/dL (ref 30.0–36.0)
MCHC: 33.3 g/dL (ref 30.0–36.0)
MCV: 95.9 fL (ref 80.0–100.0)
MCV: 95.5 fL (ref 80.0–100.0)
Monocytes Absolute: 1.5 10*3/uL — ABNORMAL HIGH (ref 0.1–1.0)
Monocytes Absolute: 1.5 10*3/uL — ABNORMAL HIGH (ref 0.1–1.0)
Monocytes Relative: 14 %
Monocytes Relative: 12 %
Neutro Abs: 7.8 10*3/uL — ABNORMAL HIGH (ref 1.7–7.7)
Neutro Abs: 9.6 10*3/uL — ABNORMAL HIGH (ref 1.7–7.7)
Neutrophils Relative %: 72 %
Neutrophils Relative %: 77 %
Platelets: 145 10*3/uL — ABNORMAL LOW (ref 150–400)
Platelets: 158 10*3/uL (ref 150–400)
RBC: 3.42 MIL/uL — ABNORMAL LOW (ref 3.87–5.11)
RBC: 2.86 MIL/uL — ABNORMAL LOW (ref 3.87–5.11)
RDW: 14.8 % (ref 11.5–15.5)
RDW: 14.9 % (ref 11.5–15.5)
WBC: 10.9 10*3/uL — ABNORMAL HIGH (ref 4.0–10.5)
WBC: 12.7 10*3/uL — ABNORMAL HIGH (ref 4.0–10.5)
nRBC: 0.2 % (ref 0.0–0.2)
nRBC: 0 % (ref 0.0–0.2)

## 2020-08-26 LAB — BASIC METABOLIC PANEL
Anion gap: 9 (ref 5–15)
BUN: 45 mg/dL — ABNORMAL HIGH (ref 8–23)
CO2: 25 mmol/L (ref 22–32)
Calcium: 8 mg/dL — ABNORMAL LOW (ref 8.9–10.3)
Chloride: 103 mmol/L (ref 98–111)
Creatinine, Ser: 0.91 mg/dL (ref 0.44–1.00)
GFR calc Af Amer: >60 mL/min (ref >60)
GFR calc non Af Amer: 53 mL/min — ABNORMAL LOW (ref >60)
Glucose, Bld: 91 mg/dL (ref 70–99)
Potassium: 5.2 mmol/L — ABNORMAL HIGH (ref 3.5–5.1)
Sodium: 137 mmol/L (ref 135–145)

## 2020-08-26 MED ORDER — IOHEXOL 350 MG/ML SOLN
100.0000 mL | Freq: Once | INTRAVENOUS | Status: AC | PRN
  Administered 2020-08-26: 100 mL via INTRAVENOUS

## 2020-08-26 NOTE — Progress Notes (Addendum)
[facility] Progress Note  CC:  GI bleed  Subjective:  Continues to bleed.  While I am in her room she keeps saying that she needs cleaned up.  Her two sons are present during my visit.    Objective:  Vital signs in last 24 hours: Temp:  [97.5 F (36.4 C)-98.4 F (36.9 C)] 98.1 F (36.7 C) (09/10 0452) Pulse Rate:  [55-71] 67 (09/10 0452) Resp:  [12-23] 17 (09/10 0452) BP: (104-140)/(46-97) 133/81 (09/10 0452) SpO2:  [88 %-100 %] 100 % (09/10 0452) Weight:  [64.8 kg] 64.8 kg (09/10 0500) Last BM Date: (P) 08/26/20 General:  Alert, Well-developed, in NAD Heart:  Regular rate and rhythm; no murmurs Pulm:  CTAB.  No W/R/R. Abdomen:  Soft, non-distended.  BS present.  Non-tender. Extremities:  Without edema. Neurologic:  Alert and oriented x 4;  grossly normal neurologically. Psych:  Alert and cooperative. Normal mood and affect.  Intake/Output from previous day: 09/09 0701 - 09/10 0700 In: 2134 [P.O.:300; I.V.:226; Blood:1108; IV Piggyback:500] Out: 1502 [Urine:1501; Stool:1] Intake/Output this shift: Total I/O In: -  Out: 300 [Urine:300]  Lab Results: Recent Labs    08/24/20 1329 08/24/20 1329 08/25/20 0424 08/25/20 0424 08/25/20 1431 08/25/20 1817 08/26/20 0624  WBC 11.2*  --  9.5  --   --   --  10.9*  HGB 5.8*   < > 7.8*   < > 6.5* 7.6* 11.0*  HCT 17.6*   < > 23.6*   < > 19.7* 22.8* 32.8*  PLT 211  --  189  --   --   --  145*   < > = values in this interval not displayed.   BMET Recent Labs    08/24/20 1329 08/25/20 0424  NA 130* 131*  K 4.7 4.4  CL 97* 101  CO2 23 23  GLUCOSE 113* 89  BUN 46* 44*  CREATININE 1.08* 0.88  CALCIUM 8.4* 7.8*   LFT Recent Labs    08/24/20 1329  PROT 6.0*  ALBUMIN 2.9*  AST 20  ALT 18  ALKPHOS 98  BILITOT 0.4   PT/INR Recent Labs    08/24/20 1329  LABPROT 12.7  INR 1.0   DG CHEST PORT 1 VIEW  Result Date: 08/24/2020 CLINICAL DATA:  GI bleed. EXAM: PORTABLE CHEST 1 VIEW COMPARISON:   Radiograph 07/27/2020.  CT 02/24/2019 FINDINGS: Chronic left pleural effusion is grossly stable from prior radiograph. Associated basilar opacities likely atelectasis. Stable heart size and mediastinal contours. Aortic atherosclerosis. No pulmonary edema. No pneumothorax. Chronic change about the right shoulder. No acute osseous abnormalities are seen. IMPRESSION: Chronic left pleural effusion and basilar atelectasis, grossly stable from prior radiograph. No acute findings. Electronically Signed   By: Keith Rake M.D.   On: 08/24/2020 15:46   CT Angio Abd/Pel w/ and/or w/o  Result Date: 08/24/2020 CLINICAL DATA:  86 year old with GI bleed. EXAM: CTA ABDOMEN AND PELVIS WITHOUT AND WITH CONTRAST TECHNIQUE: Multidetector CT imaging of the abdomen and pelvis was performed using the standard protocol during bolus administration of intravenous contrast. Multiplanar reconstructed images and MIPs were obtained and reviewed to evaluate the vascular anatomy. CONTRAST:  22m OMNIPAQUE IOHEXOL 350 MG/ML SOLN COMPARISON:  None. FINDINGS: VASCULAR Aorta: Moderate calcified atheromatous plaque. No aneurysm. No dissection or acute findings. No significant stenosis. Celiac: Plaque at the origin causes mild stenosis. Distal branch vessels are patent. No dissection. SMA: Patent without evidence of aneurysm, dissection, vasculitis or significant stenosis. There is a replaced right  hepatic artery arising from the SMA. Renals: Single bilateral renal arteries are patent. Mild atherosclerosis without significant stenosis. No dissection or acute findings. IMA: Patent. Inflow: Tortuous and moderately calcified. No dissection, aneurysm, or significant stenosis. Proximal Outflow: Partially obscured by streak artifact from bilateral hip arthroplasties. No severe stenosis or acute findings. Veins: Venous phase imaging demonstrates patency of the portal and hepatic veins. There is no evidence of mesenteric venous thrombus. IVC is  unremarkable. Review of the MIP images confirms the above findings. NON-VASCULAR Lower chest: Small to moderate left pleural effusion and adjacent compressive atelectasis. Normal scarring in the medial right middle lobe. Hepatobiliary: No focal hepatic abnormality. Mild biliary dilatation post cholecystectomy. No visualized choledocholithiasis. Pancreas: No ductal dilatation or inflammation. Spleen: Small in size without focal abnormality. Adrenals/Urinary Tract: No adrenal nodule. There is thinning of bilateral renal parenchyma. No hydronephrosis. No significant perinephric edema. Urinary bladder is grossly unremarkable, partially obscured by streak artifact from bilateral hip arthroplasties. Stomach/Bowel: No accumulation of contrast within the GI tract to localize site of GI bleed. There is high-density ingested material within sigmoid colonic diverticula present on both pre and postcontrast exams. Portions of pelvic bowel loops are partially obscured by streak artifact from bilateral hip arthroplasties. The stomach is nondistended, equivocal wall thickening about the greater curvature versus nondistention. There is a duodenal diverticulum that contains high-density ingested material, also present on both pre and post contrast exam. There is no bowel wall thickening or bowel inflammation. Colonic diverticulosis is most prominent in the sigmoid colon. No diverticulitis. Is colonic interposition under the right hemidiaphragm. Cecum is high-riding. Lymphatic: No bulky abdominopelvic adenopathy. Reproductive: Uterus not visualized.  No adnexal mass. Other: No ascites or free air. Tiny fat containing umbilical hernia. Musculoskeletal: Bilateral hip arthroplasties. Diffuse degenerative change in the spine. Remote left pubic rami fractures. Bones diffusely under mineralized. IMPRESSION: 1. No accumulation of contrast within the GI tract to localize site of GI bleed. 2. Equivocal gastric wall thickening versus  nondistention. Colonic diverticulosis without diverticulitis. Duodenal diverticulum. 3. Small to moderate left pleural effusion and adjacent compressive atelectasis. Aortic Atherosclerosis (ICD10-I70.0). Electronically Signed   By: Keith Rake M.D.   On: 08/24/2020 21:14   Assessment / Plan: *GI bleeding ? Diverticular as she had a massive diverticular bleed in the past vs ulcer disease/UGIB as her BUN is up and she had been on meloxicam recently.  CT angio negative on 9/8.  She continues to bleed. *ABLA:  Secondary to above.  Hgb 10.8 grams just two weeks ago and down to 5.8 grams, received 2 units of PRBCs, and then dipped again to 6.5 grams so received 2 more yesterday (4 total this admission).  Hgb 11 this AM (? If this is spurious).  -Patient agreed to repeat CT angio today.  I have ordered that STAT and have asked her nurse to give her a dose of morphine prior to that study to help with her back pain, etc. -I am placing her NPO as well for now in case CT is negative again and she decides to proceed with EGD later today. -Monitor Hgb.  They are rechecking it again this AM. -Continue PPI gtt empirically for now.   LOS: 2 days   Laban Emperor. Zehr  08/26/2020, 8:46 AM    Attending physician's note   I have taken an interval history, reviewed the chart and examined the patient. I agree with the Advanced Practitioner's note, impression and recommendations.    CTA 08/24/2020 - for extravasation of contrast.  Await repeat CTA report from this morning  Hgb improved to 11 s/p transfusion  She continues to have episodes of hematochezia  She has history of diverticular hemorrhage in the past, likely etiology recurrent diverticular bleed but cannot exclude peptic ulcer disease  Patient is reluctant to undergo any endoscopic procedures, is interested to move towards palliative care  Continue to monitor CBC twice daily and transfuse as needed Clear liquids Supportive care  GI will sign off,  available if have any questions   I have spent 35 minutes of patient care (this includes precharting, chart review, review of results, face-to-face time used for counseling as well as treatment plan and follow-up. The patient was provided an opportunity to ask questions and all were answered. The patient agreed with the plan and demonstrated an understanding of the instructions.  Damaris Hippo , MD [phone]

## 2020-08-26 NOTE — TOC Progression Note (Signed)
Transition of Care [facility]) - Progression Note    Patient Details  Name: [name] MRN: [id number] Date of Birth: 11-19-1921  Transition of [facility]) CM/SW Contact  Ross Ludwig, [place] Phone Number: 08/26/2020, 11:55 AM  Clinical Narrative:     CSW spoke to Yates Decamp at [place] home.  She said patient just transferred to SNF unit for rehab, and then had to come to hospital.  Per Yates Decamp, patient can return to SNF over the weekend with rehab, or hospice services if needed.  CSW to continue to follow patient's progress throughout discharge planning.  Expected Discharge Plan: [place] Barriers to Discharge: Continued Medical Work up  Expected Discharge Plan and Services Expected Discharge Plan: [place] [address] In-house Referral: Clinical Social Work   Post Acute Care Choice: [place] Living arrangements for the past 2 months: [place]                                       Social Determinants of Health (SDOH) Interventions    Readmission Risk Interventions No flowsheet data found.

## 2020-08-26 NOTE — NC FL2 (Signed)
[place] LEVEL OF CARE SCREENING TOOL     IDENTIFICATION  Patient Name: [name] Birthdate: 05/29/1921 Sex: female Admission Date (Current Location): 08/24/2020  [facility] and [place] Number:  Herbalist and Address:  [facility],  [place] [address] [place]      Provider Number: [id number]  Attending Physician Name and Address:  Nita Sells, MD  Relative Name and Phone Number:  Iman,Brad Legal Guardian [phone]  [phone]    Current Level of Care: Hospital Recommended Level of Care: [place] Prior Approval Number:    Date Approved/Denied:   PASRR Number: [id number]  Discharge Plan: SNF    Current Diagnoses: Patient Active Problem List   Diagnosis Date Noted  . GI bleed 08/24/2020  . Leukocytosis 08/24/2020  . DNR (do not resuscitate) 08/24/2020  . Anemia due to GI blood loss   . Right-sided chest wall pain 08/01/2020  . Cellulitis of right toe 08/01/2020  . Pleural effusion on left 08/01/2020  . Right hip pain 07/13/2020  . T2DM (type 2 diabetes mellitus) ([place]) 03/16/2020  . Postmenopausal symptoms 03/16/2020  . CKD (chronic kidney disease) stage 3, GFR 30-59 ml/min 07/29/2019  . Pain in gums 07/22/2019  . Amaurosis fugax of right eye 01/15/2019  . Glaucoma 01/14/2019  . Macular degeneration 01/14/2019  . Chronic diastolic CHF (congestive heart failure) ([place]) 08/13/2018  . Pulmonary hypertension, primary ([place]) 07/31/2018  . Neuropathy 06/20/2018  . Paroxysmal atrial fibrillation ([place]) 06/20/2018  . Iron deficiency 01/21/2018  . Fall 01/09/2018  . Protein calorie malnutrition ([place]) 12/05/2017  . Advanced care planning/counseling discussion 11/20/2017  . Melena   . Diverticulosis of colon with hemorrhage   . Acute blood loss anemia   . Hematochezia 11/03/2017  . Thrush 04/15/2017  . Debility 04/03/2017  . Hyponatremia 07/09/2015  . Hearing loss of both ears 04/08/2014  . HTN  (hypertension) 03/27/2014  . Slow transit constipation 03/27/2014  . Skin lesion of face 03/27/2014  . Rotator cuff syndrome of right shoulder 09/30/2012  . Breast cancer ([place])   . Lung cancer, lower lobe ([place]) 10/25/2011  . PSVT (paroxysmal supraventricular tachycardia) ([place]) 03/29/2011  . INTERDIGITAL NEUROMA 03/28/2010  . IMPINGEMENT SYNDROME 03/28/2010  . THYROID NODULE, RIGHT 09/06/2008  . LEG EDEMA, BILATERAL 12/22/2007  . Depression with anxiety 07/31/2007  . Other specified disorders of adrenal gland ([place]) 05/14/2007  . Hyperlipidemia 01/28/2007  . Blood loss anemia 01/28/2007  . Allergic rhinitis 01/28/2007  . GERD (gastroesophageal reflux disease) 01/28/2007  . OVERACTIVE BLADDER 01/28/2007  . Osteoarthritis 01/28/2007  . Pain in lower back 01/28/2007  . Osteoporosis 01/28/2007    Orientation RESPIRATION BLADDER Height & Weight     Self, Time, Situation, Place    Continent Weight: 142 lb 12.8 oz (64.8 kg) Height:  5' 4"  (162.6 cm)  BEHAVIORAL SYMPTOMS/MOOD NEUROLOGICAL BOWEL NUTRITION STATUS      Continent Diet  AMBULATORY STATUS COMMUNICATION OF NEEDS Skin   Limited Assist Verbally PU Stage and Appropriate Care                       Personal Care Assistance Level of Assistance  Bathing, Feeding, Dressing Bathing Assistance: Limited assistance Feeding assistance: Independent Dressing Assistance: Limited assistance     Functional Limitations Info  Sight, Hearing, Speech Sight Info: Adequate Hearing Info: Adequate Speech Info: Adequate    SPECIAL CARE FACTORS FREQUENCY  PT (By licensed PT), OT (By licensed OT)  PT Frequency: Minimum 5x a week OT Frequency: Minimum 5x a week            Contractures Contractures Info: Not present    Additional Factors Info  Code Status, Allergies Code Status Info: DNR Allergies Info: Augmentin, Ibuprofen Naproxen Nsaids Statins Surgilube,           Current Medications (08/26/2020):  This is the current  hospital active medication list Current Facility-Administered Medications  Medication Dose Route Frequency Provider Last Rate Last Admin  . 0.9 %  sodium chloride infusion (Manually program via Guardrails IV Fluids)   Intravenous Once Nita Sells, MD      . 0.9 %  sodium chloride infusion  10 mL/hr Intravenous Once Tedd Sias, PA   Held at 08/24/20 3235  . acetaminophen (TYLENOL) tablet 650 mg  650 mg Oral Q6H PRN Fuller Plan A, MD       Or  . acetaminophen (TYLENOL) suppository 650 mg  650 mg Rectal Q6H PRN Fuller Plan A, MD      . acetaminophen (TYLENOL) tablet 650 mg  650 mg Oral TID Fuller Plan A, MD   650 mg at 08/26/20 1612  . albuterol (PROVENTIL) (2.5 MG/3ML) 0.083% nebulizer solution 2.5 mg  2.5 mg Nebulization Q6H PRN Fuller Plan A, MD   2.5 mg at 08/25/20 2001  . atenolol (TENORMIN) tablet 25 mg  25 mg Oral Daily Tamala Julian, Rondell A, MD   25 mg at 08/26/20 0836  . diclofenac Sodium (VOLTAREN) 1 % topical gel 2 g  2 g Topical QID PRN Fuller Plan A, MD      . [COMPLETED] furosemide (LASIX) injection 20 mg  20 mg Intravenous Once Nita Sells, MD      . morphine 2 MG/ML injection 0.5 mg  0.5 mg Intravenous Q3H PRN Nita Sells, MD   0.5 mg at 08/26/20 1619  . ondansetron (ZOFRAN) injection 4 mg  4 mg Intravenous Q6H PRN Smith, Rondell A, MD      . pantoprazole (PROTONIX) 80 mg in sodium chloride 0.9 % 100 mL (0.8 mg/mL) infusion  8 mg/hr Intravenous Continuous Mansouraty, Telford Nab., MD 10 mL/hr at 08/26/20 1757 8 mg/hr at 08/26/20 1757  . simethicone (MYLICON) chewable tablet 80 mg  80 mg Oral QID PRN Smith, Rondell A, MD      . sodium chloride tablet 1 g  1 g Oral BID WC Tamala Julian, Rondell A, MD   1 g at 08/26/20 1612     Discharge Medications: Please see discharge summary for a list of discharge medications.  Relevant Imaging Results:  Relevant Lab Results:   Additional Information SSN [id number]  Ross Ludwig, LCSW

## 2020-08-26 NOTE — Progress Notes (Signed)
MD notified of pt's ongoing large bloody and black bowel movements (at least 5 this shift). CBC ordered. Will continue to monitor.

## 2020-08-26 NOTE — Consult Note (Signed)
Palliative Care Consult Note  Reason for consult: Goals of care in light or GI bleed and patient desire for no further interventions  Palliative care consult received.  Chart reviewed including personal review of pertinent labs and imaging.  Briefly, [name] is a 86 year old female with PMHx PSVT, HFpEF, hyponatremia, prior breast and lung CA, chronic left pleural effusion, chronic hip pain, neuropathy, depression, and long term resident of [place] who presented to hospital with GI bleed.  Bleeding continues and patient has elected to defer any further interventions.  Palliative consulted for [place].  I saw and examined [name] today.  She was sleepy at time of my encounter,  And while she opened her eyes, she did not really participate in conversation other than to identify person at bedside as her son.  She reported being tired.  Her son, [name], and I discussed clinical course as well as wishes moving forward in regard to care plan in light of her continued bleeding and expressed desire to forgo further interventions.  We discussed difference between a aggressive medical intervention path and a palliative, comfort focused care path.  Values and goals of care important to patient and family were attempted to be elicited.  [name] reports he and his brother have wanted to ensure that she understands situation and is making decisions with clear mind.  She has been consistent in stating desires and her family is working to accept this as it means that she is likely going to die in the near future.  [name] also expressed concern that she may not be stable enough to transfer from the hospital and want to ensure this is taken into consideration with any discharge planning.    Questions and concerns addressed.   PMT will continue to support holistically.  - DNR/DNI - While [name] was very tired and not really open to conversation with me this evening, she has been clear and consistent over the past  several days (both prior to transfer and also since being in the hospital) in desire to forgo further interventions.  Based upon her expressed wishes, I would recommend transition back to [facility] with hospice services assuming she remains hemodynamically stable to do so. - I talked with her son, [name], today regarding recommendation for transition with hospice support.  He reports that he and his brother are concerned about her stability for transport.  I discussed that this is certainly something that can change overnight, and my recommendation was for [place] to reevaluate her condition in the morning.   - Plan to coordinate with Dr. Verlon Au and meet with family in AM to discuss best option moving forward based upon her clinical course overnight.  Start time: 1820 End time: 1910 Total time: 50 minutes  Greater than 50%  of this time was spent counseling and coordinating care related to the above assessment and plan.  [name] Rough, MD [place] [facility] [phone]

## 2020-08-26 NOTE — Progress Notes (Signed)
PROGRESS NOTE    [name]  OBS:[id number] DOB: 1921-06-15 DOA: 08/24/2020 PCP: Virgie Dad, MD  Brief Narrative:  86 [facility] History of PSVT with HFpEF, chronic hyponatremia, prior breast and lung cancer with chronic left pleural effusion, depression with anxiety on Cymbalta, chronic right hip pain status post bilateral arthroplasty in the past (has had multiple steroid injections and trochanteric bursa 07/20/2020) neuropathy  Hospitalized 8/10 through 8/13 2/2 hyponatremia sodium 120-T toast potomania?  Tramadol-low cortisol at the time-was discharged on free water fluid extubated no  Appears was started on meloxicam 8/31 for acute superimposed on chronic hip pain as could not tolerate even low doses of oxycodone-she had an MRI of the hip and back region to find the cause of her pain  Seen subsequently at nursing home secondary to feeling "weak" hemoglobin had dropped to 7 and subsequently on 9/8 had dropped to 5 and she was referred over after her own reluctance for work-up and blood transfusion GI was consulted and recommended a CT angiogram that was performed on 9/8 that showed no active bleed-patient expressed reluctance to do any type of scope and wishes to be DNR    Assessment & Plan:   Principal Problem:   GI bleed Active Problems:   PSVT (paroxysmal supraventricular tachycardia) (HCC)   Hematochezia   Acute blood loss anemia   Protein calorie malnutrition (HCC)   Right hip pain   Leukocytosis   DNR (do not resuscitate)   1. Acute anemia of blood loss 2/2 undefined GI bleed-not sure diverticular or peptic ulcer per GI see their note a. Hemoglobin stabilized b. Clear liquid diet and transition PPI GTT to p.o. twice daily Protonix in a.m. 2. PSVT a. Continue atenolol 25 b. On monitor she is sinus bradycardia to sinus 3. ?  Question of UTI a. She has no burning in urine no leukocytosis so I will not repeat UA b. I have canceled her urine  culture 4. Chronic right hip pain in the setting of prior injections a. Difficult situation-tells me that she had good relief with meloxicam b. Transition IV morphine to low-dose oxycodone 5. Prior breast and lung cancer with chronic left pleural effusion 6. Bipolar on Cymbalta a. I will discontinue her Cymbalta at this time b. If needed we will start low-dose Ativan 7. 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 Continue IV fluid b. Continue clear liquid diet 8. HFpEF compensated 9. DNR status a. Multiple extensive conversations with children b. Plan is to discharge if hemodynamically stable over the next 24 hours back to facility-she does not want any further procedures and is competent and coherent to make that decision  DVT prophylaxis: SCD Code Status: DNR confirmed at the bedside Family Communication: Both sons discussed with today Disposition:   Status is: Inpatient  Remains inpatient appropriate because:Hemodynamically unstable and IV treatments appropriate due to intensity of illness or inability to take PO   Dispo: The patient is from: SNF              Anticipated d/c is to: SNF              Anticipated d/c date is: 2 days              Patient currently is not medically stable to d/c.       Consultants:   GI  Palliative care  Procedures: Multiple  Antimicrobials: None   Subjective: Multiple dark stools Otherwise feels fair Reiterates does not want procedures  Objective: Vitals:   08/26/20 0452 08/26/20 0500 08/26/20 0852 08/26/20 1140  BP: 133/81   (!) 121/50  Pulse: 67   60  Resp: 17   16  Temp: 98.1 F (36.7 C)   98.6 F (37 C)  TempSrc:    Oral  SpO2: 100%  100% 97%  Weight:  64.8 kg    Height:  _0  (1.626 m)      Intake/Output Summary (Last 24 hours) at 08/26/2020 1501 Last data filed at 08/26/2020 1030 Gross per 24 hour  Intake 1813.86 ml  Output 2002 ml  Net -188.14 ml   Filed Weights   08/24/20 1258 08/26/20 0500  Weight: 69 kg  64.8 kg    Examination:  Awake pleasant no distress EOMI NCAT Chest clear no added sound no rales rhonchi Abdomen soft no rebound no guarding ROM lower extremities soft nontender Neurologically intact slow mentation but stable  Data Reviewed: I have personally reviewed following labs and imaging studies Sodium 137 potassium 5.2 BUNs/creatinine 45/0.9 up from 44/0.8 WBC 10.9 Hemoglobin 11.0 Platelet 145   Radiology Studies: DG CHEST PORT 1 VIEW  Result Date: 08/24/2020 CLINICAL DATA:  GI bleed. EXAM: PORTABLE CHEST 1 VIEW COMPARISON:  Radiograph 07/27/2020.  CT 02/24/2019 FINDINGS: Chronic left pleural effusion is grossly stable from prior radiograph. Associated basilar opacities likely atelectasis. Stable heart size and mediastinal contours. Aortic atherosclerosis. No pulmonary edema. No pneumothorax. Chronic change about the right shoulder. No acute osseous abnormalities are seen. IMPRESSION: Chronic left pleural effusion and basilar atelectasis, grossly stable from prior radiograph. No acute findings. Electronically Signed   By: Keith Rake M.D.   On: 08/24/2020 15:46   CT Angio Abd/Pel w/ and/or w/o  Result Date: 08/26/2020 CLINICAL DATA:  86 year old female with a history of gastrointestinal hemorrhage EXAM: CTA ABDOMEN AND PELVIS WITHOUT AND WITH CONTRAST TECHNIQUE: Multidetector CT imaging of the abdomen and pelvis was performed using the standard protocol during bolus administration of intravenous contrast. Multiplanar reconstructed images and MIPs were obtained and reviewed to evaluate the vascular anatomy. CONTRAST:  157m OMNIPAQUE IOHEXOL 350 MG/ML SOLN COMPARISON:  08/24/2020 FINDINGS: VASCULAR Aorta: Mild atherosclerotic changes of the distal thoracic aorta. Mild atherosclerosis of the abdominal aorta. No aneurysm. No dissection. No periaortic fluid. Celiac: Celiac artery patent with mild atherosclerosis. SMA: SMA patent with mild atherosclerosis. Renals: - Right: Right  renal artery patent with no significant atherosclerosis at the origin. - Left: Left renal artery patent with mild atherosclerosis at the origin. IMA: Inferior mesenteric artery is patent. Right lower extremity: Tortuosity of the right iliac system. Hypogastric artery is patent. No high-grade stenosis or occlusion of the external iliac artery. Common femoral artery patent without significant atherosclerosis. Proximal profunda femoris and SFA patent. Left lower extremity: Tortuosity of the left iliac system. Hypogastric artery is patent. Mild atherosclerosis of the iliac system without high-grade stenosis or occlusion. External iliac artery patent. Common femoral artery patent with minimal atherosclerosis. Profunda femoris and SFA patent. Veins: Unremarkable appearance of the venous system. Review of the MIP images confirms the above findings. NON-VASCULAR Lower chest: Small left-sided pleural effusion with associated atelectasis. Bronchiectatic changes of the visualized right middle lobe and lingula, as well as within the left lower lobe. Given the presence of calcifications in this region as well as some architectural distortion and centrilobular nodularity of the right middle lobe visualized, there is concern for chronic infection such as MAC. Hepatobiliary: Unremarkable appearance of the liver. Absent gallbladder. Common bile duct is estimated 9 mm-10  mm. No radiopaque stones within the biliary tree. Pancreas: Unremarkable. Spleen: Unremarkable. Adrenals/Urinary Tract: - Right adrenal gland: Unremarkable - Left adrenal gland: Unremarkable. - Right kidney: No hydronephrosis, nephrolithiasis, inflammation, or ureteral dilation. There are small lesions of the right kidney which are too small to characterize. The largest appears low-density at the superior cortex, 6 mm. - Left Kidney: No hydronephrosis, nephrolithiasis, inflammation, or ureteral dilation. No focal lesion. - Urinary Bladder: Urinary bladder relatively  decompressed. Bladder not well visualized given the degree of streak artifact Stomach/Bowel: - Stomach: Gastric wall thickening, similar to the comparison. - Small bowel: Small bowel decompressed with no transition point, wall thickening, or distention. No accumulation of contrast within small bowel. - Appendix: . Appendix is not visualized, however, no inflammatory changes are present adjacent to the cecum to indicate an appendicitis. - Colon: Chilaiditi's syndrome. Small volume fluid within the transverse colon. Small volume fluid within the splenic flexure and descending colon. No abnormal distension or transition point of the colon. There are no focal inflammatory changes of the colon or wall thickening. Diverticular change is fairly diffuse throughout the colon though worst in the sigmoid colon and rectosigmoid region. There is no accumulation of contrast within the colon to indicate hemorrhage. Lymphatic: No adenopathy. Mesenteric: No free fluid or air. No mesenteric adenopathy. Reproductive: Hysterectomy Other: Small fat containing umbilical hernia. Musculoskeletal: Bilateral hip arthroplasty. This contributes to significant streak artifact of the pelvis. Left apex scoliotic curvature of the lumbar spine centered at L4. No acute displaced fracture. Multilevel degenerative changes of the thoracolumbar spine. No significant bony canal narrowing. Grade 1 anterolisthesis of L3 on L4 and L4 on L5. Partially healed/remodeled left inferior pubic ramus fracture. IMPRESSION: CT angiogram is negative for gastrointestinal hemorrhage. Diffuse diverticular disease, mostly within the left colon, without evidence of acute diverticulitis. Similar appearance of gastric wall thickening. Correlation with upper endoscopy may be considered. Left-sided pleural effusion with associated atelectasis. Changes of the visualized lower lungs concerning for chronic infection such as MAC. Aortic Atherosclerosis (ICD10-I70.0). Additional  ancillary findings as above. Signed, Dulcy Fanny. Dellia Nims, RPVI [facility] Radiology Electronically Signed   By: Corrie Mckusick D.O.   On: 08/26/2020 12:50   CT Angio Abd/Pel w/ and/or w/o  Result Date: 08/24/2020 CLINICAL DATA:  86 year old with GI bleed. EXAM: CTA ABDOMEN AND PELVIS WITHOUT AND WITH CONTRAST TECHNIQUE: Multidetector CT imaging of the abdomen and pelvis was performed using the standard protocol during bolus administration of intravenous contrast. Multiplanar reconstructed images and MIPs were obtained and reviewed to evaluate the vascular anatomy. CONTRAST:  32m OMNIPAQUE IOHEXOL 350 MG/ML SOLN COMPARISON:  None. FINDINGS: VASCULAR Aorta: Moderate calcified atheromatous plaque. No aneurysm. No dissection or acute findings. No significant stenosis. Celiac: Plaque at the origin causes mild stenosis. Distal branch vessels are patent. No dissection. SMA: Patent without evidence of aneurysm, dissection, vasculitis or significant stenosis. There is a replaced right hepatic artery arising from the SMA. Renals: Single bilateral renal arteries are patent. Mild atherosclerosis without significant stenosis. No dissection or acute findings. IMA: Patent. Inflow: Tortuous and moderately calcified. No dissection, aneurysm, or significant stenosis. Proximal Outflow: Partially obscured by streak artifact from bilateral hip arthroplasties. No severe stenosis or acute findings. Veins: Venous phase imaging demonstrates patency of the portal and hepatic veins. There is no evidence of mesenteric venous thrombus. IVC is unremarkable. Review of the MIP images confirms the above findings. NON-VASCULAR Lower chest: Small to moderate left pleural effusion and adjacent compressive atelectasis. Normal  scarring in the medial right middle lobe. Hepatobiliary: No focal hepatic abnormality. Mild biliary dilatation post cholecystectomy. No visualized choledocholithiasis. Pancreas:  No ductal dilatation or inflammation. Spleen: Small in size without focal abnormality. Adrenals/Urinary Tract: No adrenal nodule. There is thinning of bilateral renal parenchyma. No hydronephrosis. No significant perinephric edema. Urinary bladder is grossly unremarkable, partially obscured by streak artifact from bilateral hip arthroplasties. Stomach/Bowel: No accumulation of contrast within the GI tract to localize site of GI bleed. There is high-density ingested material within sigmoid colonic diverticula present on both pre and postcontrast exams. Portions of pelvic bowel loops are partially obscured by streak artifact from bilateral hip arthroplasties. The stomach is nondistended, equivocal wall thickening about the greater curvature versus nondistention. There is a duodenal diverticulum that contains high-density ingested material, also present on both pre and post contrast exam. There is no bowel wall thickening or bowel inflammation. Colonic diverticulosis is most prominent in the sigmoid colon. No diverticulitis. Is colonic interposition under the right hemidiaphragm. Cecum is high-riding. Lymphatic: No bulky abdominopelvic adenopathy. Reproductive: Uterus not visualized.  No adnexal mass. Other: No ascites or free air. Tiny fat containing umbilical hernia. Musculoskeletal: Bilateral hip arthroplasties. Diffuse degenerative change in the spine. Remote left pubic rami fractures. Bones diffusely under mineralized. IMPRESSION: 1. No accumulation of contrast within the GI tract to localize site of GI bleed. 2. Equivocal gastric wall thickening versus nondistention. Colonic diverticulosis without diverticulitis. Duodenal diverticulum. 3. Small to moderate left pleural effusion and adjacent compressive atelectasis. Aortic Atherosclerosis (ICD10-I70.0). Electronically Signed   By: Keith Rake M.D.   On: 08/24/2020 21:14     Scheduled Meds: . sodium chloride   Intravenous Once  . acetaminophen  650 mg  Oral TID  . atenolol  25 mg Oral Daily  . [COMPLETED] furosemide  20 mg Intravenous Once  . sodium chloride  1 g Oral BID WC   Continuous Infusions: . sodium chloride Stopped (08/24/20 1922)  . pantoprozole (PROTONIX) infusion 8 mg/hr (08/26/20 0627)     LOS: 2 days    Time spent: [place], MD [facility] To contact the attending provider between 7A-7P or the covering provider during after hours 7P-7A, please log into the web site [email or web address] and access using universal [place] password for that web site. If you do not have the password, please call the hospital operator.  08/26/2020, 3:01 PM

## 2020-08-26 NOTE — Care Management Important Message (Signed)
Important Message  Patient Details IM Letter given to the Patient Name: [name] MRN: [id number] Date of Birth: 07-Aug-1921   Medicare Important Message Given:  Yes     Kerin Salen 08/26/2020, 11:58 AM

## 2020-08-27 DIAGNOSIS — Z515 Encounter for palliative care: Secondary | ICD-10-CM

## 2020-08-27 LAB — CBC WITH DIFFERENTIAL/PLATELET
Abs Immature Granulocytes: 0.11 K/uL — ABNORMAL HIGH (ref 0.00–0.07)
Basophils Absolute: 0 K/uL (ref 0.0–0.1)
Basophils Relative: 0 %
Eosinophils Absolute: 0.1 K/uL (ref 0.0–0.5)
Eosinophils Relative: 1 %
HCT: 25 % — ABNORMAL LOW (ref 36.0–46.0)
Hemoglobin: 8.1 g/dL — ABNORMAL LOW (ref 12.0–15.0)
Immature Granulocytes: 1 %
Lymphocytes Relative: 8 %
Lymphs Abs: 1 K/uL (ref 0.7–4.0)
MCH: 32.3 pg (ref 26.0–34.0)
MCHC: 32.4 g/dL (ref 30.0–36.0)
MCV: 99.6 fL (ref 80.0–100.0)
Monocytes Absolute: 1.2 K/uL — ABNORMAL HIGH (ref 0.1–1.0)
Monocytes Relative: 9 %
Neutro Abs: 10.6 K/uL — ABNORMAL HIGH (ref 1.7–7.7)
Neutrophils Relative %: 81 %
Platelets: 183 K/uL (ref 150–400)
RBC: 2.51 MIL/uL — ABNORMAL LOW (ref 3.87–5.11)
RDW: 15 % (ref 11.5–15.5)
WBC: 13.1 K/uL — ABNORMAL HIGH (ref 4.0–10.5)
nRBC: 0 % (ref 0.0–0.2)

## 2020-08-27 MED ORDER — MORPHINE SULFATE (CONCENTRATE) 10 MG /0.5 ML PO SOLN
10.0000 mg | ORAL | 0 refills | Status: DC | PRN
Start: 2020-08-27 — End: 2020-08-31

## 2020-08-27 MED ORDER — LORAZEPAM 2 MG/ML PO CONC: 1.0000 mg | ORAL | 0 refills | Status: AC | PRN

## 2020-08-27 MED ORDER — MORPHINE SULFATE (CONCENTRATE) 10 MG/0.5ML PO SOLN
10.0000 mg | ORAL | Status: DC | PRN
  Administered 2020-08-27: 10 mg via ORAL
  Filled 2020-08-27: qty 0.5

## 2020-08-27 MED ORDER — PANTOPRAZOLE SODIUM 40 MG PO PACK: 40.0000 mg | PACK | Freq: Two times a day (BID) | ORAL | 0 refills | Status: AC

## 2020-08-27 NOTE — Plan of Care (Signed)
Patient is on RM with no distress noted at this time

## 2020-08-27 NOTE — TOC Transition Note (Signed)
Transition of Care [facility]) - CM/SW Discharge Note   Patient Details  Name: [name] MRN: [id number] Date of Birth: 1921/03/17  [facility] CM/SW Contact:  Ross Ludwig, LCSW Phone Number: 08/27/2020, 11:04 AM   Clinical Narrative:     CSW contacted [place] on call Andee Poles, and informed her that patient is ready for discharge today with hospice to follow.  CSW updated bedside nurse, and physician.  Patient to be d/c'ed today to [facility].  Patient and family agreeable to plans will transport via ems RN to call report to [phone].  Patient's son is at bedside and aware that she is discharging today.     Final next level of care: Skilled Nursing Facility Barriers to Discharge: Barriers Resolved   Patient Goals and CMS Choice Patient states their goals for this hospitalization and ongoing recovery are:: To [facility] for [facility]. CMS Medicare.gov Compare Post Acute Care list provided to:: Patient Choice offered to / list presented to : Patient  Discharge Placement PASRR number recieved: 08/26/20            Patient chooses bed at: [place] Patient to be transferred to facility by: [facility] Name of family member notified: Voice mail left for Brad legal guardian [phone] Patient and family notified of of transfer: 08/27/20  Discharge Plan and Services In-house Referral: Clinical Social Work   Post Acute Care Choice: [place]                               Social Determinants of Health (SDOH) Interventions     Readmission Risk Interventions No flowsheet data found.

## 2020-08-27 NOTE — Discharge Summary (Signed)
Physician Discharge Summary  [name] IEP:[id number] DOB: [birth year] DOA: 08/24/2020  PCP: Virgie Dad, MD  Admit date: 08/24/2020 Discharge date: 08/27/2020  Time spent: 37 minutes  Recommendations for Outpatient Follow-up:  1. Returning to nursing facility with hospice following for end-of-life care 2. Please discontinue/cut back the dose of atenolol if she becomes further hypotensive-this was continued at the request of the family because sometimes she gets anxious with the PSVT 3. Hard scripts given for Ativan Roxanol on discharge for comfort   Discharge Diagnoses:  Principal Problem:   GI bleed Active Problems:   PSVT (paroxysmal supraventricular tachycardia) (HCC)   Hematochezia   Acute blood loss anemia   Protein calorie malnutrition (HCC)   Right hip pain   Leukocytosis   DNR (do not resuscitate)   Hospice care patient   Discharge Condition: Guarded less than 2 weeks  Diet recommendation: Comfort  Filed Weights   08/24/20 1258 08/26/20 0500  Weight: 69 kg 64.8 kg    History of present illness:  86 year old [name] [facility] resident History of PSVT with HFpEF, chronic hyponatremia, prior breast and lung cancer with chronic left pleural effusion, depression with anxiety on Cymbalta, chronic right hip pain status post bilateral arthroplasty in the past (has had multiple steroid injections and trochanteric bursa 07/20/2020) neuropathy      Hospitalized 8/10 through 8/13 2/2 hyponatremia sodium 120-T toast potomania?  Tramadol-low cortisol at the time-was discharged on free water fluid extubated no  Appears was started on meloxicam 8/31 for acute superimposed on chronic hip pain as could not tolerate even low doses of oxycodone-she had an MRI of the hip and back region to find the cause of her pain  Seen subsequently at nursing home secondary to feeling "weak" hemoglobin had dropped to 7 and subsequently on 9/8 had dropped to 5 and she was referred to emergency  room GI was consulted and recommended a CT angiogram that was performed on 9/8 that showed no active bleed-patient expressed reluctance to do any type of scope and wishes to be DNR  Hospital Course:  1. Acute anemia of blood loss 2/2 undefined GI bleed-not sure diverticular or peptic ulcer per GI see their note a. Transfused multiple times this hospital stay with drop in hemoglobin to 8.1 and continued GI bleeding b. Was initially on IV gtt. PPI Protonix-after goals of care discussions and patient not wishing any further aggressive management including any interventions, patient has been transitioned to oral PPI to help possibly slow the bleeding c. It is unlikely that this will occur-patient will probably die from this in the next several days see below 2. PSVT a. Continue atenolol 25 temporarily needs to be discontinued going forward if she becomes hypotensive at facility 3. ?  Question of UTI a. She has no burning in urine no leukocytosis so I will not repeat UA b. I have canceled her urine culture 4. Chronic right hip pain in the setting of prior injections a. GI bleed with likely likely exacerbated by prior to admission meloxicam b. Now on IV morphine for comfort 5. Prior breast and lung cancer with chronic left pleural effusion 6. Bipolar on Cymbalta a. All oral anxiolytics discontinued b. Ativan given for anxiety 7. 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 No further labs comfort care 8. HFpEF compensated 9. DNR status a. Multiple extensive conversations with children over several days b. Palliative care also consulted for opinion c. Decision made 9/10 by patient who was in her right mind at  the time to transition back to facility with hospice care did not want any further interventions d. I have prescribed Roxanol, Ativan for comfort and discontinue multiple other medications please see above discussion e. She is terminal and will die from this GI bleed in an effort to control her  anxiety we will ensure that she has Ativan and Roxanol as above a small prescription  Procedures:  Multiple CT scans  Consultations:  GI  Past  Discharge Exam: Vitals:   08/27/20 0130 08/27/20 0637  BP:  106/89  Pulse:  84  Resp:  16  Temp:  98 F (36.7 C)  SpO2: 93%    Patient appears much weaker than she was yesterday She is able to verbalize a little With lab work drawn she awoke but according to son she has been quite sleepy since yesterday Has had further bleeding and her hemoglobin is dropped to 8 down from 11 despite transfusion We had a long discussion yesterday about no further transfusion or supportive management and family understood the same and In addition palliative care saw the patient General: Awake alert coherent no distress neck soft supple Cardiovascular: S1-S2 no murmur rub or gallop Respiratory: Clinically clear no added sound Patient has sacral decubitus and decubitus over toe prior to admission present  Discharge Instructions   Discharge Instructions    Diet - low sodium heart healthy   Complete by: As directed    Increase activity slowly   Complete by: As directed    No wound care   Complete by: As directed      Allergies as of 08/27/2020      Reactions   Augmentin [amoxicillin-pot Clavulanate] Other (See Comments)   unknown   Ibuprofen Hives   Naproxen Other (See Comments)   Unknown Tears stomach up   Nsaids    Tears stomach up   Statins Other (See Comments)   Feels like knives in stomach   Surgilube [gyne-moistrin]    Rectal itching, burning   Tape Rash      Medication List    STOP taking these medications   DULoxetine 30 MG capsule Commonly known as: CYMBALTA   ferrous sulfate 220 (44 Fe) MG/5ML solution   gabapentin 100 MG capsule Commonly known as: NEURONTIN   mupirocin ointment 2 % Commonly known as: BACTROBAN   omeprazole 20 MG capsule Commonly known as: PRILOSEC   oxyCODONE 5 MG immediate release  tablet Commonly known as: Oxy IR/ROXICODONE   PreserVision AREDS 2 Caps   simethicone 80 MG chewable tablet Commonly known as: MYLICON   sodium chloride 1 g tablet     TAKE these medications   acetaminophen 325 MG tablet Commonly known as: TYLENOL Take 650 mg by mouth in the morning, at noon, in the evening, and at bedtime.   atenolol 25 MG tablet Commonly known as: TENORMIN Take 1 tablet (25 mg total) by mouth daily.   diclofenac Sodium 1 % Gel Commonly known as: Voltaren Apply 2 g topically 4 (four) times daily as needed. What changed: reasons to take this   latanoprost 0.005 % ophthalmic solution Commonly known as: XALATAN Place 1 drop into both eyes at bedtime.   LORazepam 2 MG/ML concentrated solution Commonly known as: ATIVAN Take 0.5 mLs (1 mg total) by mouth every 4 (four) hours as needed for anxiety.   morphine CONCENTRATE 10 mg / 0.5 ml concentrated solution Place 0.5 mLs (10 mg total) under the tongue every 3 (three) hours as needed for severe pain.  pantoprazole sodium 40 mg/20 mL Pack Commonly known as: PROTONIX Place 20 mLs (40 mg total) into feeding tube 2 (two) times daily.      Allergies  Allergen Reactions  . Augmentin [Amoxicillin-Pot Clavulanate] Other (See Comments)    unknown  . Ibuprofen Hives  . Naproxen Other (See Comments)    Unknown Tears stomach up  . Nsaids     Tears stomach up  . Statins Other (See Comments)    Feels like knives in stomach  . Surgilube [Gyne-Moistrin]     Rectal itching, burning  . Tape Rash      The results of significant diagnostics from this hospitalization (including imaging, microbiology, ancillary and laboratory) are listed below for reference.    Significant Diagnostic Studies: MR Hip Right w/o contrast  Result Date: 08/09/2020 CLINICAL DATA:  Right hip pain for 1 month. History of prior hip replacement. EXAM: MR OF THE RIGHT HIP WITHOUT CONTRAST TECHNIQUE: Multiplanar, multisequence MR imaging  was performed. No intravenous contrast was administered. COMPARISON:  Plain films of the hips 07/11/2020. FINDINGS: Bones/Joint/Cartilage Despite using artifact reduction techniques in this patient with bilateral hip arthroplasties, there is artifact from the study somewhat limiting the exam. No fracture, stress change or worrisome lesion is identified. There is no evidence of hardware loosening. No bony destructive change about either hip. No joint effusion. No mass or fluid collection is seen about either hip. Ligaments Visualization is limited but no obvious abnormality is seen. Muscles and Tendons Intact and unremarkable. Soft tissues Imaged intrapelvic contents demonstrate sigmoid diverticulosis. The patient appears to be status post hysterectomy. IMPRESSION: No acute abnormality or finding to explain the patient's symptoms. No complicating feature related to the patient's right hip replacement. Diverticulosis. Electronically Signed   By: Inge Rise M.D.   On: 08/09/2020 15:19   DG CHEST PORT 1 VIEW  Result Date: 08/24/2020 CLINICAL DATA:  GI bleed. EXAM: PORTABLE CHEST 1 VIEW COMPARISON:  Radiograph 07/27/2020.  CT 02/24/2019 FINDINGS: Chronic left pleural effusion is grossly stable from prior radiograph. Associated basilar opacities likely atelectasis. Stable heart size and mediastinal contours. Aortic atherosclerosis. No pulmonary edema. No pneumothorax. Chronic change about the right shoulder. No acute osseous abnormalities are seen. IMPRESSION: Chronic left pleural effusion and basilar atelectasis, grossly stable from prior radiograph. No acute findings. Electronically Signed   By: Keith Rake M.D.   On: 08/24/2020 15:46   CT Angio Abd/Pel w/ and/or w/o  Result Date: 08/26/2020 CLINICAL DATA:  86 year old female with a history of gastrointestinal hemorrhage EXAM: CTA ABDOMEN AND PELVIS WITHOUT AND WITH CONTRAST TECHNIQUE: Multidetector CT imaging of the abdomen and pelvis was performed  using the standard protocol during bolus administration of intravenous contrast. Multiplanar reconstructed images and MIPs were obtained and reviewed to evaluate the vascular anatomy. CONTRAST:  169m OMNIPAQUE IOHEXOL 350 MG/ML SOLN COMPARISON:  08/24/2020 FINDINGS: VASCULAR Aorta: Mild atherosclerotic changes of the distal thoracic aorta. Mild atherosclerosis of the abdominal aorta. No aneurysm. No dissection. No periaortic fluid. Celiac: Celiac artery patent with mild atherosclerosis. SMA: SMA patent with mild atherosclerosis. Renals: - Right: Right renal artery patent with no significant atherosclerosis at the origin. - Left: Left renal artery patent with mild atherosclerosis at the origin. IMA: Inferior mesenteric artery is patent. Right lower extremity: Tortuosity of the right iliac system. Hypogastric artery is patent. No high-grade stenosis or occlusion of the external iliac artery. Common femoral artery patent without significant atherosclerosis. Proximal profunda femoris and SFA patent. Left lower extremity: Tortuosity of the left  iliac system. Hypogastric artery is patent. Mild atherosclerosis of the iliac system without high-grade stenosis or occlusion. External iliac artery patent. Common femoral artery patent with minimal atherosclerosis. Profunda femoris and SFA patent. Veins: Unremarkable appearance of the venous system. Review of the MIP images confirms the above findings. NON-VASCULAR Lower chest: Small left-sided pleural effusion with associated atelectasis. Bronchiectatic changes of the visualized right middle lobe and lingula, as well as within the left lower lobe. Given the presence of calcifications in this region as well as some architectural distortion and centrilobular nodularity of the right middle lobe visualized, there is concern for chronic infection such as MAC. Hepatobiliary: Unremarkable appearance of the liver. Absent gallbladder. Common bile duct is estimated 9 mm-10 mm. No  radiopaque stones within the biliary tree. Pancreas: Unremarkable. Spleen: Unremarkable. Adrenals/Urinary Tract: - Right adrenal gland: Unremarkable - Left adrenal gland: Unremarkable. - Right kidney: No hydronephrosis, nephrolithiasis, inflammation, or ureteral dilation. There are small lesions of the right kidney which are too small to characterize. The largest appears low-density at the superior cortex, 6 mm. - Left Kidney: No hydronephrosis, nephrolithiasis, inflammation, or ureteral dilation. No focal lesion. - Urinary Bladder: Urinary bladder relatively decompressed. Bladder not well visualized given the degree of streak artifact Stomach/Bowel: - Stomach: Gastric wall thickening, similar to the comparison. - Small bowel: Small bowel decompressed with no transition point, wall thickening, or distention. No accumulation of contrast within small bowel. - Appendix: . Appendix is not visualized, however, no inflammatory changes are present adjacent to the cecum to indicate an appendicitis. - Colon: Chilaiditi's syndrome. Small volume fluid within the transverse colon. Small volume fluid within the splenic flexure and descending colon. No abnormal distension or transition point of the colon. There are no focal inflammatory changes of the colon or wall thickening. Diverticular change is fairly diffuse throughout the colon though worst in the sigmoid colon and rectosigmoid region. There is no accumulation of contrast within the colon to indicate hemorrhage. Lymphatic: No adenopathy. Mesenteric: No free fluid or air. No mesenteric adenopathy. Reproductive: Hysterectomy Other: Small fat containing umbilical hernia. Musculoskeletal: Bilateral hip arthroplasty. This contributes to significant streak artifact of the pelvis. Left apex scoliotic curvature of the lumbar spine centered at L4. No acute displaced fracture. Multilevel degenerative changes of the thoracolumbar spine. No significant bony canal narrowing. Grade 1  anterolisthesis of L3 on L4 and L4 on L5. Partially healed/remodeled left inferior pubic ramus fracture. IMPRESSION: CT angiogram is negative for gastrointestinal hemorrhage. Diffuse diverticular disease, mostly within the left colon, without evidence of acute diverticulitis. Similar appearance of gastric wall thickening. Correlation with upper endoscopy may be considered. Left-sided pleural effusion with associated atelectasis. Changes of the visualized lower lungs concerning for chronic infection such as MAC. Aortic Atherosclerosis (ICD10-I70.0). Additional ancillary findings as above. Signed, Dulcy Fanny. Dellia Nims, RPVI [facility] Radiology Electronically Signed   By: Corrie Mckusick D.O.   On: 08/26/2020 12:50   CT Angio Abd/Pel w/ and/or w/o  Result Date: 08/24/2020 CLINICAL DATA:  86 year old with GI bleed. EXAM: CTA ABDOMEN AND PELVIS WITHOUT AND WITH CONTRAST TECHNIQUE: Multidetector CT imaging of the abdomen and pelvis was performed using the standard protocol during bolus administration of intravenous contrast. Multiplanar reconstructed images and MIPs were obtained and reviewed to evaluate the vascular anatomy. CONTRAST:  43m OMNIPAQUE IOHEXOL 350 MG/ML SOLN COMPARISON:  None. FINDINGS: VASCULAR Aorta: Moderate calcified atheromatous plaque. No aneurysm. No dissection or acute findings. No significant stenosis. Celiac: Plaque at the origin causes  mild stenosis. Distal branch vessels are patent. No dissection. SMA: Patent without evidence of aneurysm, dissection, vasculitis or significant stenosis. There is a replaced right hepatic artery arising from the SMA. Renals: Single bilateral renal arteries are patent. Mild atherosclerosis without significant stenosis. No dissection or acute findings. IMA: Patent. Inflow: Tortuous and moderately calcified. No dissection, aneurysm, or significant stenosis. Proximal Outflow: Partially obscured by streak artifact from  bilateral hip arthroplasties. No severe stenosis or acute findings. Veins: Venous phase imaging demonstrates patency of the portal and hepatic veins. There is no evidence of mesenteric venous thrombus. IVC is unremarkable. Review of the MIP images confirms the above findings. NON-VASCULAR Lower chest: Small to moderate left pleural effusion and adjacent compressive atelectasis. Normal scarring in the medial right middle lobe. Hepatobiliary: No focal hepatic abnormality. Mild biliary dilatation post cholecystectomy. No visualized choledocholithiasis. Pancreas: No ductal dilatation or inflammation. Spleen: Small in size without focal abnormality. Adrenals/Urinary Tract: No adrenal nodule. There is thinning of bilateral renal parenchyma. No hydronephrosis. No significant perinephric edema. Urinary bladder is grossly unremarkable, partially obscured by streak artifact from bilateral hip arthroplasties. Stomach/Bowel: No accumulation of contrast within the GI tract to localize site of GI bleed. There is high-density ingested material within sigmoid colonic diverticula present on both pre and postcontrast exams. Portions of pelvic bowel loops are partially obscured by streak artifact from bilateral hip arthroplasties. The stomach is nondistended, equivocal wall thickening about the greater curvature versus nondistention. There is a duodenal diverticulum that contains high-density ingested material, also present on both pre and post contrast exam. There is no bowel wall thickening or bowel inflammation. Colonic diverticulosis is most prominent in the sigmoid colon. No diverticulitis. Is colonic interposition under the right hemidiaphragm. Cecum is high-riding. Lymphatic: No bulky abdominopelvic adenopathy. Reproductive: Uterus not visualized.  No adnexal mass. Other: No ascites or free air. Tiny fat containing umbilical hernia. Musculoskeletal: Bilateral hip arthroplasties. Diffuse degenerative change in the spine. Remote  left pubic rami fractures. Bones diffusely under mineralized. IMPRESSION: 1. No accumulation of contrast within the GI tract to localize site of GI bleed. 2. Equivocal gastric wall thickening versus nondistention. Colonic diverticulosis without diverticulitis. Duodenal diverticulum. 3. Small to moderate left pleural effusion and adjacent compressive atelectasis. Aortic Atherosclerosis (ICD10-I70.0). Electronically Signed   By: Keith Rake M.D.   On: 08/24/2020 21:14    Microbiology: Recent Results (from the past 240 hour(s))  SARS Coronavirus 2 by RT PCR (hospital order, performed in [facility] hospital lab) Nasopharyngeal Nasopharyngeal Swab     Status: None   Collection Time: 08/24/20  3:18 PM   Specimen: Nasopharyngeal Swab  Result Value Ref Range Status   SARS Coronavirus 2 NEGATIVE NEGATIVE Final    Comment: (NOTE) SARS-CoV-2 target nucleic acids are NOT DETECTED.  The SARS-CoV-2 RNA is generally detectable in upper and lower respiratory specimens during the acute phase of infection. The lowest concentration of SARS-CoV-2 viral copies this assay can detect is 250 copies / mL. A negative result does not preclude SARS-CoV-2 infection and should not be used as the sole basis for treatment or other patient management decisions.  A negative result may occur with improper specimen collection / handling, submission of specimen other than nasopharyngeal swab, presence of viral mutation(s) within the areas targeted by this assay, and inadequate number of viral copies (<250 copies / mL). A negative result must be combined with clinical observations, patient history, and epidemiological information.  Fact Sheet for Patients:   StrictlyIdeas.no  Fact Sheet for Healthcare Providers:  BankingDealers.co.za  This test is not yet approved or  cleared by the [place] and has been authorized for detection and/or diagnosis of SARS-CoV-2  by FDA under an Emergency Use Authorization (EUA).  This EUA will remain in effect (meaning this test can be used) for the duration of the COVID-19 declaration under Section 564(b)(1) of the Act, 21 U.S.C. section 360bbb-3(b)(1), unless the authorization is terminated or revoked sooner.  Performed at [facility], [place] [address] [place],  [address]      Labs: Basic Metabolic Panel: Recent Labs  Lab 08/24/20 1329 08/25/20 0424 08/26/20 1256  NA 130* 131* 137  K 4.7 4.4 5.2*  CL 97* 101 103  CO2 _0 GLUCOSE 113* 89 91  BUN 46* 44* 45*  CREATININE 1.08* 0.88 0.91  CALCIUM 8.4* 7.8* 8.0*   Liver Function Tests: Recent Labs  Lab 08/24/20 1329  AST 20  ALT 18  ALKPHOS 98  BILITOT 0.4  PROT 6.0*  ALBUMIN 2.9*   No results for input(s): LIPASE, AMYLASE in the last 168 hours. No results for input(s): AMMONIA in the last 168 hours. CBC: Recent Labs  Lab 08/24/20 1329 08/24/20 1329 08/25/20 0424 08/25/20 0424 08/25/20 1431 08/25/20 1817 08/26/20 0624 08/26/20 1843 08/27/20 0849  WBC 11.2*  --  9.5  --   --   --  10.9* 12.7* 13.1*  NEUTROABS 8.9*  --   --   --   --   --  7.8* 9.6* 10.6*  HGB 5.8*   < > 7.8*   < > 6.5* 7.6* 11.0* 9.1* 8.1*  HCT 17.6*   < > 23.6*   < > 19.7* 22.8* 32.8* 27.3* 25.0*  MCV 99.4  --  96.7  --   --   --  95.9 95.5 99.6  PLT 211  --  189  --   --   --  145* 158 183   < > = values in this interval not displayed.   Cardiac Enzymes: No results for input(s): CKTOTAL, CKMB, CKMBINDEX, TROPONINI in the last 168 hours. BNP: BNP (last 3 results) No results for input(s): BNP in the last 8760 hours.  ProBNP (last 3 results) No results for input(s): PROBNP in the last 8760 hours.  CBG: No results for input(s): GLUCAP in the last 168 hours.     Signed:  Nita Sells MD   [facility] 08/27/2020, 9:56 AM

## 2020-08-27 NOTE — Progress Notes (Signed)
AVS given to [facility] and explained over the phone with the receiving nurse at [facility]. Medications and follow up appointments have been explained with the pt's nurse verbalizing understanding.

## 2020-08-28 LAB — TYPE AND SCREEN
ABO/RH(D): B POS
Antibody Screen: NEGATIVE
Unit division: 0
Unit division: 0
Unit division: 0
Unit division: 0

## 2020-08-28 LAB — BPAM RBC
Blood Product Expiration Date: 202110012359
Blood Product Expiration Date: 202110062359
Blood Product Expiration Date: 202110072359
Blood Product Expiration Date: 202110092359
ISSUE DATE / TIME: 202109082011
ISSUE DATE / TIME: 202109082256
ISSUE DATE / TIME: 202109092106
ISSUE DATE / TIME: 202109100118
Unit Type and Rh: 1700
Unit Type and Rh: 7300
Unit Type and Rh: 7300
Unit Type and Rh: 7300

## 2020-08-29 ENCOUNTER — Non-Acute Institutional Stay (SKILLED_NURSING_FACILITY): Payer: Medicare Other | Admitting: Internal Medicine

## 2020-08-29 ENCOUNTER — Encounter: Payer: Self-pay | Admitting: Internal Medicine

## 2020-08-29 DIAGNOSIS — K922 Gastrointestinal hemorrhage, unspecified: Secondary | ICD-10-CM | POA: Diagnosis not present

## 2020-08-29 DIAGNOSIS — D500 Iron deficiency anemia secondary to blood loss (chronic): Secondary | ICD-10-CM | POA: Diagnosis not present

## 2020-08-29 DIAGNOSIS — F418 Other specified anxiety disorders: Secondary | ICD-10-CM

## 2020-08-29 DIAGNOSIS — Z515 Encounter for palliative care: Secondary | ICD-10-CM

## 2020-08-29 DIAGNOSIS — E871 Hypo-osmolality and hyponatremia: Secondary | ICD-10-CM | POA: Diagnosis not present

## 2020-08-29 DIAGNOSIS — R69 Illness, unspecified: Secondary | ICD-10-CM | POA: Diagnosis not present

## 2020-08-29 NOTE — Progress Notes (Signed)
Provider:  Veleta Miners MD Location:   [facility] Room Number: 46 Place of Service:  SNF (31)  PCP: Virgie Dad, MD Patient Care Team: Virgie Dad, MD as PCP - General (Internal Medicine) Lanelle Bal., MD as Referring Physician (Internal Medicine) Satira Sark, MD as Consulting Physician (Cardiology) Kyung Rudd, MD as Consulting Physician (Radiation Oncology) Mast, Man X, NP as Nurse Practitioner (Internal Medicine)  Extended Emergency Contact Information Primary Emergency Contact: Neita Garnet of [place] Phone: [phone] Mobile Phone: [phone] Relation: Legal Guardian Secondary Emergency Contact: First,Lance Address: [address]          [place],  [address] [name] of [place] Phone: [phone] Mobile Phone: [phone] Relation: Son  Code Status: DNR Goals of Care: Advanced Directive information Advanced Directives 08/29/2020  Does Patient Have a Medical Advance Directive? Yes  Type of Paramedic of [place];Living will;Out of facility DNR (pink MOST or yellow form)  Does patient want to make changes to medical advance directive? No - Patient declined  Copy of [place] in Chart? Yes - validated most recent copy scanned in chart (See row information)  Would patient like information on creating a medical advance directive? -  Pre-existing out of facility DNR order (yellow form or pink MOST form) Pink MOST form placed in chart (order not valid for inpatient use)      Chief Complaint  Patient presents with  . New Admit To SNF    Admission    HPI: Patient is a 86 y.o. female seen today for admission to [facility] for End of life care  Patient has a history of diastolic CHF, hyponatremia, hypertension, diabetes mellitus, PSVT and history of breast cancer and lung cancer,.Iron Def Anemiaon Oxygen PRN,Left Sided Pleural Effusion  Patient was send  ot the hospital for GI bleed. Lower. She was transfused 4 units of Blood. Continued to bleed. Refused any Endoscopic procedure Had 2 CTA unable to find sit of Bleeding She was Discharged with hospice services. When I went to see patient she had already gotten 10 mg of Roxanol.  But she was crying and screaming and saying why if she is still here and she wants to die.  We had to give her 5 mg of Ativan and that did calm her down.  Patient had stopped bleeding but is not eating much Unable to give me any history just looked restless. Past Medical History:  Diagnosis Date  . Allergic rhinitis   . Anemia    NOS  . Anxiety   . Biceps tendon rupture    Bilateral  . Breast cancer ([place]) 2001   Left s/p lumpectomy and XRT   . Bronchiectasis ([place]) 2014   Noted on chest x-ray  . Complication of anesthesia    slow to wake up  . Cough 2014  . Depression   . Diverticulosis   . Elevated liver enzymes    Biopsy consistent with steatohepatitis  . Fundic gland polyps of stomach, benign   . GERD (gastroesophageal reflux disease) 06/2007   EGD Dr Gala Romney >sm HH, multiple fundic gland polyps  . Glaucoma 2013   both eyes  . Hemorrhoids   . Hiatal hernia   . Hx of radiation therapy 07/17/10 to 07/21/10   SRS LLL lung  . Hyperlipidemia   . Insomnia   . Low back pain    scoliosis  . Lung cancer ([place]) 05/2010   Left 2011 -  rad x 3  . Lymphedema    Left arm  . Osteoarthritis   . Osteopenia 10/25/2016  . Osteoporosis, senile   . Overactive bladder   . Pneumonia    RLL with sepsis  . PSVT (paroxysmal supraventricular tachycardia) ([place])   . Rheumatic fever    Age 27  . Right thyroid nodule   . Steatohepatitis    liver bx  . Type 2 diabetes mellitus ([place])    Past Surgical History:  Procedure Laterality Date  . ABDOMINAL HYSTERECTOMY  1964  . APPENDECTOMY  1964  . BIOPSY THYROID  11/2008  . BREAST BIOPSY     X 3 w/ cystectomy  . BREAST LUMPECTOMY  2001  . CATARACT EXTRACTION Bilateral 2003    Lens implant Dr. Charise Killian  . CHOLECYSTECTOMY  1965  . COLONOSCOPY  09/11/2011   Procedure: COLONOSCOPY;  Surgeon: Dorothyann Peng, MD;  Location[facility];  Service: Endoscopy;  Laterality: N/A;  . COLONOSCOPY  2005 /2012   Dr. Lucianne Muss- diverticulosis, ext hemorrhoids.  . COLONOSCOPY WITH PROPOFOL N/A 11/09/2017   Procedure: COLONOSCOPY WITH PROPOFOL;  Surgeon: Mauri Pole, MD;  Location: [facility];  Service: Endoscopy;  Laterality: N/A;  . COLONOSCOPY WITH PROPOFOL N/A 11/12/2017   Procedure: COLONOSCOPY WITH PROPOFOL;  Surgeon: Ladene Artist, MD;  Location: [facility];  Service: Endoscopy;  Laterality: N/A;  . CYSTOSCOPY  2007  . ESOPHAGOGASTRODUODENOSCOPY  09/11/2011   Procedure: ESOPHAGOGASTRODUODENOSCOPY (EGD);  Surgeon: Dorothyann Peng, MD;  [facility];  Service: Endoscopy;  Laterality: N/A;  . ESOPHAGOGASTRODUODENOSCOPY (EGD) WITH PROPOFOL N/A 11/08/2017   Procedure: ESOPHAGOGASTRODUODENOSCOPY (EGD) WITH PROPOFOL;  Surgeon: Mauri Pole, MD;  [facility];  Service: Endoscopy;  Laterality: N/A;  . LIVER BIOPSY  2008  . LUNG BIOPSY Left 06/06/2010  . REVISION TOTAL HIP ARTHROPLASTY  2007   Left  . SKIN CANCER EXCISION     nose and left lower cheek  . TONSILLECTOMY  1930  . TOTAL HIP ARTHROPLASTY Bilateral 2005 & 2007    Dr. Earl Lagos. Aline Brochure     reports that she has never smoked. She has never used smokeless tobacco. She reports that she does not drink alcohol and does not use drugs. Social History   Socioeconomic History  . Marital status: Widowed    Spouse name: Not on file  . Number of children: 2  . Years of education: college   . Highest education level: Not on file  Occupational History  . Occupation: retired Tax Comptroller: RETIRED  Tobacco Use  . Smoking status: Never Smoker  . Smokeless tobacco: Never Used  . Tobacco comment: 2nd hand-husband  Vaping Use  . Vaping Use: Never used  Substance and Sexual  Activity  . Alcohol use: No  . Drug use: No  . Sexual activity: Not Currently  Other Topics Concern  . Not on file  Social History Narrative   Lives at [facility] 02/2013   2 adopted children   Was in [place], [place]   Widowed 2013   Walks with walker   Exercise: none    POA - Living Will   Social Determinants of Health   Financial Resource Strain:   . Difficulty of Paying Living Expenses: Not on file  Food Insecurity:   . Worried About Charity fundraiser in the Last Year: Not on file  . Ran Out of Food in the Last Year: Not on file  Transportation  Needs:   . Lack of Transportation (Medical): Not on file  . Lack of Transportation (Non-Medical): Not on file  Physical Activity:   . Days of Exercise per Week: Not on file  . Minutes of Exercise per Session: Not on file  Stress:   . Feeling of Stress : Not on file  Social Connections:   . Frequency of Communication with Friends and Family: Not on file  . Frequency of Social Gatherings with Friends and Family: Not on file  . Attends Religious Services: Not on file  . Active Member of Clubs or Organizations: Not on file  . Attends Archivist Meetings: Not on file  . Marital Status: Not on file  Intimate Partner Violence:   . Fear of Current or Ex-Partner: Not on file  . Emotionally Abused: Not on file  . Physically Abused: Not on file  . Sexually Abused: Not on file    Functional Status Survey:    Family History  Problem Relation Age of Onset  . Heart failure Mother   . Osteoporosis Mother   . Hypertension Father   . Stroke Father   . Heart failure Father   . Leukemia Brother   . Heart failure Brother   . Cancer Maternal Grandmother   . Stroke Maternal Grandfather   . Cancer Other     Health Maintenance  Topic Date Due  . FOOT EXAM  Never done  . URINE MICROALBUMIN  04/30/2008  . OPHTHALMOLOGY EXAM  12/27/2019  . INFLUENZA VACCINE  07/17/2020  . TETANUS/TDAP  10/13/2029 (Originally  12/18/2015)  . HEMOGLOBIN A1C  11/17/2020  . DEXA SCAN  Completed  . COVID-19 Vaccine  Completed  . PNA vac Low Risk Adult  Completed    Allergies  Allergen Reactions  . Augmentin [Amoxicillin-Pot Clavulanate] Other (See Comments)    unknown  . Ibuprofen Hives  . Naproxen Other (See Comments)    Unknown Tears stomach up  . Nsaids     Tears stomach up  . Statins Other (See Comments)    Feels like knives in stomach  . Surgilube [Gyne-Moistrin]     Rectal itching, burning  . Tape Rash    Allergies as of 08/29/2020      Reactions   Augmentin [amoxicillin-pot Clavulanate] Other (See Comments)   unknown   Ibuprofen Hives   Naproxen Other (See Comments)   Unknown Tears stomach up   Nsaids    Tears stomach up   Statins Other (See Comments)   Feels like knives in stomach   Surgilube [gyne-moistrin]    Rectal itching, burning   Tape Rash      Medication List       Accurate as of August 29, 2020 11:31 AM. If you have any questions, ask your nurse or doctor.        acetaminophen 325 MG tablet Commonly known as: TYLENOL Take 650 mg by mouth in the morning, at noon, in the evening, and at bedtime.   atenolol 25 MG tablet Commonly known as: TENORMIN Take 1 tablet (25 mg total) by mouth daily.   diclofenac Sodium 1 % Gel Commonly known as: Voltaren Apply 2 g topically 4 (four) times daily as needed. What changed: reasons to take this   latanoprost 0.005 % ophthalmic solution Commonly known as: XALATAN Place 1 drop into both eyes at bedtime.   LORazepam 2 MG/ML concentrated solution Commonly known as: ATIVAN Take 0.5 mLs (1 mg total) by mouth every 4 (four) hours as  needed for anxiety.   morphine CONCENTRATE 10 mg / 0.5 ml concentrated solution Place 0.5 mLs (10 mg total) under the tongue every 3 (three) hours as needed for severe pain.   pantoprazole sodium 40 mg/20 mL Pack Commonly known as: PROTONIX Place 20 mLs (40 mg total) into feeding tube 2 (two) times  daily.       Review of Systems  Unable to perform ROS: Other    Vitals:   08/29/20 1124  BP: 116/66  Pulse: 68  Resp: 20  Temp: (!) 97.2 F (36.2 C)  SpO2: 96%  Weight: 151 lb 6.4 oz (68.7 kg)  Height: 5' 6"  (1.676 m)   Body mass index is 24.44 kg/m. Physical Exam Vitals reviewed.  Constitutional:      Appearance: Normal appearance.  HENT:     Head: Normocephalic.     Nose: Nose normal.     Mouth/Throat:     Mouth: Mucous membranes are moist.     Pharynx: Oropharynx is clear.  Eyes:     Pupils: Pupils are equal, round, and reactive to light.  Cardiovascular:     Rate and Rhythm: Tachycardia present.     Heart sounds: Murmur heard.   Pulmonary:     Effort: Pulmonary effort is normal.     Breath sounds: Normal breath sounds.  Abdominal:     General: Abdomen is flat.     Palpations: Abdomen is soft.  Musculoskeletal:        General: No swelling.     Cervical back: Neck supple.  Skin:    General: Skin is warm.  Neurological:     Mental Status: She is alert.     Comments: Screaming Not letting to do exam  Psychiatric:     Comments: Very Anxious and restless     Labs reviewed: Basic Metabolic Panel: Recent Labs    06/21/20 0000 07/19/20 0000 07/28/20 0436 07/28/20 0436 07/29/20 0422 08/10/20 0000 08/24/20 1329 08/25/20 0424 08/26/20 1256  NA 133*   < > 131*   < > 130*   < > 130* 131* 137  K 5.0   < > 4.0   < > 3.9   < > 4.7 4.4 5.2*  CL 98*   < > 96*   < > 96*   < > 97* 101 103  CO2 30*   < > 26   < > 26   < > 23 23 25   GLUCOSE  --    < > 94   < > 94  --  113* 89 91  BUN 24*   < > 15   < > 17   < > 46* 44* 45*  CREATININE 0.9   < > 0.79   < > 0.78   < > 1.08* 0.88 0.91  CALCIUM 8.7   < > 8.9   < > 9.1   < > 8.4* 7.8* 8.0*  MG 1.8  --  1.9  --  1.8  --   --   --   --   PHOS  --   --  3.2  --  3.5  --   --   --   --    < > = values in this interval not displayed.   Liver Function Tests: Recent Labs    07/28/20 0436 07/28/20 0436  07/29/20 0422 08/10/20 0000 08/24/20 1329  AST 27   < > 24 22 20   ALT 24   < > 22 16  18  ALKPHOS 83   < > 77 101 98  BILITOT 0.7  --  0.8  --  0.4  PROT 6.7  --  6.8  --  6.0*  ALBUMIN 3.2*   < > 3.1* 3.5 2.9*   < > = values in this interval not displayed.   No results for input(s): LIPASE, AMYLASE in the last 8760 hours. No results for input(s): AMMONIA in the last 8760 hours. CBC: Recent Labs    08/26/20 0624 08/26/20 1843 08/27/20 0849  WBC 10.9* 12.7* 13.1*  NEUTROABS 7.8* 9.6* 10.6*  HGB 11.0* 9.1* 8.1*  HCT 32.8* 27.3* 25.0*  MCV 95.9 95.5 99.6  PLT 145* 158 183   Cardiac Enzymes: No results for input(s): CKTOTAL, CKMB, CKMBINDEX, TROPONINI in the last 8760 hours. BNP: Invalid input(s): POCBNP Lab Results  Component Value Date   HGBA1C 5.3 05/18/2020   Lab Results  Component Value Date   TSH 2.338 07/26/2020   Lab Results  Component Value Date   WOEHOZYY48 250 09/10/2011   Lab Results  Component Value Date   FOLATE >20.0 09/10/2011   Lab Results  Component Value Date   IRON 36 (L) 12/24/2017   TIBC 248 (L) 09/10/2011   FERRITIN 251 12/24/2017    Imaging and Procedures obtained prior to SNF admission: DG CHEST PORT 1 VIEW  Result Date: 08/24/2020 CLINICAL DATA:  GI bleed. EXAM: PORTABLE CHEST 1 VIEW COMPARISON:  Radiograph 07/27/2020.  CT 02/24/2019 FINDINGS: Chronic left pleural effusion is grossly stable from prior radiograph. Associated basilar opacities likely atelectasis. Stable heart size and mediastinal contours. Aortic atherosclerosis. No pulmonary edema. No pneumothorax. Chronic change about the right shoulder. No acute osseous abnormalities are seen. IMPRESSION: Chronic left pleural effusion and basilar atelectasis, grossly stable from prior radiograph. No acute findings. Electronically Signed   By: Keith Rake M.D.   On: 08/24/2020 15:46   CT Angio Abd/Pel w/ and/or w/o  Result Date: 08/24/2020 CLINICAL DATA:  86 year old with GI bleed.  EXAM: CTA ABDOMEN AND PELVIS WITHOUT AND WITH CONTRAST TECHNIQUE: Multidetector CT imaging of the abdomen and pelvis was performed using the standard protocol during bolus administration of intravenous contrast. Multiplanar reconstructed images and MIPs were obtained and reviewed to evaluate the vascular anatomy. CONTRAST:  55m OMNIPAQUE IOHEXOL 350 MG/ML SOLN COMPARISON:  None. FINDINGS: VASCULAR Aorta: Moderate calcified atheromatous plaque. No aneurysm. No dissection or acute findings. No significant stenosis. Celiac: Plaque at the origin causes mild stenosis. Distal branch vessels are patent. No dissection. SMA: Patent without evidence of aneurysm, dissection, vasculitis or significant stenosis. There is a replaced right hepatic artery arising from the SMA. Renals: Single bilateral renal arteries are patent. Mild atherosclerosis without significant stenosis. No dissection or acute findings. IMA: Patent. Inflow: Tortuous and moderately calcified. No dissection, aneurysm, or significant stenosis. Proximal Outflow: Partially obscured by streak artifact from bilateral hip arthroplasties. No severe stenosis or acute findings. Veins: Venous phase imaging demonstrates patency of the portal and hepatic veins. There is no evidence of mesenteric venous thrombus. IVC is unremarkable. Review of the MIP images confirms the above findings. NON-VASCULAR Lower chest: Small to moderate left pleural effusion and adjacent compressive atelectasis. Normal scarring in the medial right middle lobe. Hepatobiliary: No focal hepatic abnormality. Mild biliary dilatation post cholecystectomy. No visualized choledocholithiasis. Pancreas: No ductal dilatation or inflammation. Spleen: Small in size without focal abnormality. Adrenals/Urinary Tract: No adrenal nodule. There is thinning of bilateral renal parenchyma. No hydronephrosis. No significant perinephric edema. Urinary bladder is grossly  unremarkable, partially obscured by streak  artifact from bilateral hip arthroplasties. Stomach/Bowel: No accumulation of contrast within the GI tract to localize site of GI bleed. There is high-density ingested material within sigmoid colonic diverticula present on both pre and postcontrast exams. Portions of pelvic bowel loops are partially obscured by streak artifact from bilateral hip arthroplasties. The stomach is nondistended, equivocal wall thickening about the greater curvature versus nondistention. There is a duodenal diverticulum that contains high-density ingested material, also present on both pre and post contrast exam. There is no bowel wall thickening or bowel inflammation. Colonic diverticulosis is most prominent in the sigmoid colon. No diverticulitis. Is colonic interposition under the right hemidiaphragm. Cecum is high-riding. Lymphatic: No bulky abdominopelvic adenopathy. Reproductive: Uterus not visualized.  No adnexal mass. Other: No ascites or free air. Tiny fat containing umbilical hernia. Musculoskeletal: Bilateral hip arthroplasties. Diffuse degenerative change in the spine. Remote left pubic rami fractures. Bones diffusely under mineralized. IMPRESSION: 1. No accumulation of contrast within the GI tract to localize site of GI bleed. 2. Equivocal gastric wall thickening versus nondistention. Colonic diverticulosis without diverticulitis. Duodenal diverticulum. 3. Small to moderate left pleural effusion and adjacent compressive atelectasis. Aortic Atherosclerosis (ICD10-I70.0). Electronically Signed   By: Keith Rake M.D.   On: 08/24/2020 21:14    Assessment/Plan Patient with GI bleed, Hyponatremia, PSVT and Right hip Pain  Will Continue Roxanol and Ativan for Pain and Anxiety Continue Beta Blocker per Families wishes Hospice Consult made   Family/ staff Communication:   Labs/tests ordered:

## 2020-08-31 ENCOUNTER — Other Ambulatory Visit: Payer: Medicare HMO

## 2020-08-31 ENCOUNTER — Other Ambulatory Visit: Payer: Self-pay | Admitting: Family

## 2020-08-31 ENCOUNTER — Telehealth: Payer: Self-pay | Admitting: *Deleted

## 2020-08-31 DIAGNOSIS — M25551 Pain in right hip: Secondary | ICD-10-CM

## 2020-08-31 MED ORDER — OXYCODONE HCL 5 MG PO TABS: 2.5000 mg | ORAL_TABLET | Freq: Two times a day (BID) | ORAL | 0 refills | Status: AC

## 2020-08-31 NOTE — Telephone Encounter (Signed)
Received call from Marieta Markov (son) stating pt was d/c from [facility] on Saturday and was placed in hos[pice care and will not be making radiology appts or any follow up appts and to close out any referrals and follow ups. States that mom appreciates all that was done for her.

## 2020-08-31 NOTE — Progress Notes (Signed)
Facility Nurse request Morphine oral solution 10 mg every 2 hrs to be discontinue request refill for Oxycodone 5 mg tablet 1/2 tablet by mouth twice daily.Not on medication list  Oxycodone added to med list.

## 2020-09-05 ENCOUNTER — Other Ambulatory Visit: Payer: Medicare HMO

## 2020-09-16 DEATH — deceased
